# Patient Record
Sex: Male | Born: 1955 | Race: White | Hispanic: No | Smoking: Former smoker
Health system: Southern US, Community
[De-identification: ages and names within clinical notes are randomized; demographics above are authoritative.]

## PROBLEM LIST (undated history)

## (undated) DIAGNOSIS — K5792 Diverticulitis of intestine, part unspecified, without perforation or abscess without bleeding: Secondary | ICD-10-CM

## (undated) DIAGNOSIS — I350 Nonrheumatic aortic (valve) stenosis: Secondary | ICD-10-CM

## (undated) DIAGNOSIS — K222 Esophageal obstruction: Secondary | ICD-10-CM

## (undated) DIAGNOSIS — K922 Gastrointestinal hemorrhage, unspecified: Secondary | ICD-10-CM

## (undated) DIAGNOSIS — I10 Essential (primary) hypertension: Secondary | ICD-10-CM

## (undated) DIAGNOSIS — I429 Cardiomyopathy, unspecified: Secondary | ICD-10-CM

## (undated) DIAGNOSIS — I2510 Atherosclerotic heart disease of native coronary artery without angina pectoris: Secondary | ICD-10-CM

## (undated) DIAGNOSIS — E785 Hyperlipidemia, unspecified: Secondary | ICD-10-CM

## (undated) DIAGNOSIS — K649 Unspecified hemorrhoids: Secondary | ICD-10-CM

## (undated) DIAGNOSIS — E119 Type 2 diabetes mellitus without complications: Secondary | ICD-10-CM

## (undated) DIAGNOSIS — G4730 Sleep apnea, unspecified: Secondary | ICD-10-CM

## (undated) DIAGNOSIS — M109 Gout, unspecified: Secondary | ICD-10-CM

## (undated) DIAGNOSIS — D126 Benign neoplasm of colon, unspecified: Secondary | ICD-10-CM

## (undated) HISTORY — PX: CORONARY ARTERY BYPASS GRAFT: SHX141

## (undated) HISTORY — DX: Gastrointestinal hemorrhage, unspecified: K92.2

## (undated) HISTORY — PX: SEPTOPLASTY: SUR1290

## (undated) HISTORY — PX: CORONARY ANGIOPLASTY WITH STENT PLACEMENT: SHX49

## (undated) HISTORY — PX: OTHER SURGICAL HISTORY: SHX169

## (undated) HISTORY — PX: CARDIAC SURGERY: SHX584

---

## 2011-12-25 DIAGNOSIS — B029 Zoster without complications: Secondary | ICD-10-CM | POA: Insufficient documentation

## 2011-12-25 DIAGNOSIS — Z1211 Encounter for screening for malignant neoplasm of colon: Secondary | ICD-10-CM | POA: Insufficient documentation

## 2011-12-25 DIAGNOSIS — I10 Essential (primary) hypertension: Secondary | ICD-10-CM | POA: Insufficient documentation

## 2011-12-25 DIAGNOSIS — M109 Gout, unspecified: Secondary | ICD-10-CM | POA: Insufficient documentation

## 2012-04-17 ENCOUNTER — Observation Stay: Payer: Self-pay | Admitting: Internal Medicine

## 2012-04-17 LAB — COMPREHENSIVE METABOLIC PANEL
Albumin: 4.7 g/dL (ref 3.4–5.0)
Alkaline Phosphatase: 102 U/L (ref 50–136)
Anion Gap: 13 (ref 7–16)
BUN: 14 mg/dL (ref 7–18)
Calcium, Total: 9.1 mg/dL (ref 8.5–10.1)
Chloride: 100 mmol/L (ref 98–107)
Co2: 23 mmol/L (ref 21–32)
Creatinine: 0.86 mg/dL (ref 0.60–1.30)
EGFR (African American): >60
EGFR (Non-African Amer.): >60
Glucose: 105 mg/dL — ABNORMAL HIGH (ref 65–99)
Osmolality: 273 (ref 275–301)
Potassium: 3.7 mmol/L (ref 3.5–5.1)
SGOT(AST): 81 U/L — ABNORMAL HIGH (ref 15–37)
Sodium: 136 mmol/L (ref 136–145)

## 2012-04-17 LAB — CBC
HCT: 48.9 % (ref 40.0–52.0)
HGB: 17 g/dL (ref 13.0–18.0)
MCH: 31.4 pg (ref 26.0–34.0)
MCV: 90 fL (ref 80–100)
Platelet: 186 10*3/uL (ref 150–440)
RBC: 5.41 10*6/uL (ref 4.40–5.90)
RDW: 12.7 % (ref 11.5–14.5)
WBC: 6.5 10*3/uL (ref 3.8–10.6)

## 2012-04-18 LAB — CBC WITH DIFFERENTIAL/PLATELET
Basophil %: 0.8 %
Eosinophil #: 0.3 10*3/uL (ref 0.0–0.7)
HGB: 16.3 g/dL (ref 13.0–18.0)
MCH: 31.7 pg (ref 26.0–34.0)
MCHC: 35.1 g/dL (ref 32.0–36.0)
Monocyte #: 0.8 x10 3/mm (ref 0.2–1.0)
Neutrophil #: 4.5 10*3/uL (ref 1.4–6.5)
Neutrophil %: 57.3 %
Platelet: 191 10*3/uL (ref 150–440)

## 2012-04-18 LAB — COMPREHENSIVE METABOLIC PANEL
Alkaline Phosphatase: 79 U/L (ref 50–136)
BUN: 14 mg/dL (ref 7–18)
Bilirubin,Total: 0.7 mg/dL (ref 0.2–1.0)
Calcium, Total: 8.8 mg/dL (ref 8.5–10.1)
Co2: 28 mmol/L (ref 21–32)
Creatinine: 1.06 mg/dL (ref 0.60–1.30)
Osmolality: 276 (ref 275–301)
Potassium: 3.9 mmol/L (ref 3.5–5.1)
SGOT(AST): 67 U/L — ABNORMAL HIGH (ref 15–37)
SGPT (ALT): 121 U/L — ABNORMAL HIGH (ref 12–78)
Total Protein: 7.8 g/dL (ref 6.4–8.2)

## 2012-04-18 LAB — LIPID PANEL
Cholesterol: 181 mg/dL (ref 0–200)
Triglycerides: 230 mg/dL — ABNORMAL HIGH (ref 0–200)
VLDL Cholesterol, Calc: 46 mg/dL — ABNORMAL HIGH (ref 5–40)

## 2012-04-18 LAB — CK TOTAL AND CKMB (NOT AT ARMC): CK-MB: 2.4 ng/mL (ref 0.5–3.6)

## 2012-04-21 ENCOUNTER — Inpatient Hospital Stay: Payer: Self-pay | Admitting: Internal Medicine

## 2012-04-21 DIAGNOSIS — J449 Chronic obstructive pulmonary disease, unspecified: Secondary | ICD-10-CM | POA: Insufficient documentation

## 2012-04-21 DIAGNOSIS — G4730 Sleep apnea, unspecified: Secondary | ICD-10-CM | POA: Insufficient documentation

## 2012-04-21 LAB — COMPREHENSIVE METABOLIC PANEL
Albumin: 4 g/dL (ref 3.4–5.0)
Alkaline Phosphatase: 115 U/L (ref 50–136)
BUN: 23 mg/dL — ABNORMAL HIGH (ref 7–18)
Bilirubin,Total: 0.5 mg/dL (ref 0.2–1.0)
Calcium, Total: 9.4 mg/dL (ref 8.5–10.1)
Chloride: 105 mmol/L (ref 98–107)
Co2: 24 mmol/L (ref 21–32)
EGFR (Non-African Amer.): >60
Glucose: 138 mg/dL — ABNORMAL HIGH (ref 65–99)
Osmolality: 280 (ref 275–301)
Potassium: 3.8 mmol/L (ref 3.5–5.1)
SGOT(AST): 73 U/L — ABNORMAL HIGH (ref 15–37)
Sodium: 137 mmol/L (ref 136–145)

## 2012-04-21 LAB — CBC WITH DIFFERENTIAL/PLATELET
Basophil #: 0 10*3/uL (ref 0.0–0.1)
Basophil %: 0.4 %
Eosinophil #: 0.2 10*3/uL (ref 0.0–0.7)
Eosinophil %: 2 %
HGB: 15.3 g/dL (ref 13.0–18.0)
Lymphocyte %: 20.7 %
MCH: 31.5 pg (ref 26.0–34.0)
MCHC: 34.8 g/dL (ref 32.0–36.0)
MCV: 91 fL (ref 80–100)
Neutrophil #: 6.3 10*3/uL (ref 1.4–6.5)
Neutrophil %: 70.9 %
Platelet: 204 10*3/uL (ref 150–440)
RBC: 4.86 10*6/uL (ref 4.40–5.90)
RDW: 12.6 % (ref 11.5–14.5)

## 2012-04-21 LAB — CBC
HCT: 46.2 % (ref 40.0–52.0)
MCHC: 35.4 g/dL (ref 32.0–36.0)
MCV: 90 fL (ref 80–100)
RBC: 5.13 10*6/uL (ref 4.40–5.90)
RDW: 12.7 % (ref 11.5–14.5)

## 2012-04-21 LAB — DRUG SCREEN, URINE
Amphetamines, Ur Screen: NEGATIVE (ref ?–1000)
Barbiturates, Ur Screen: NEGATIVE (ref ?–200)
Benzodiazepine, Ur Scrn: NEGATIVE (ref ?–200)
Cocaine Metabolite,Ur ~~LOC~~: NEGATIVE (ref ?–300)
MDMA (Ecstasy)Ur Screen: NEGATIVE (ref ?–500)
Opiate, Ur Screen: NEGATIVE (ref ?–300)
Tricyclic, Ur Screen: NEGATIVE (ref ?–1000)

## 2012-04-21 LAB — PROTIME-INR
INR: 0.9
Prothrombin Time: 12.7 secs (ref 11.5–14.7)

## 2012-04-21 LAB — CK TOTAL AND CKMB (NOT AT ARMC)
CK, Total: 121 U/L (ref 35–232)
CK-MB: 2.2 ng/mL (ref 0.5–3.6)

## 2012-04-21 LAB — MAGNESIUM: Magnesium: 1.7 mg/dL — ABNORMAL LOW

## 2012-04-22 DIAGNOSIS — G4733 Obstructive sleep apnea (adult) (pediatric): Secondary | ICD-10-CM | POA: Insufficient documentation

## 2012-04-22 DIAGNOSIS — I2510 Atherosclerotic heart disease of native coronary artery without angina pectoris: Secondary | ICD-10-CM | POA: Insufficient documentation

## 2012-04-22 DIAGNOSIS — I10 Essential (primary) hypertension: Secondary | ICD-10-CM | POA: Insufficient documentation

## 2012-04-25 DIAGNOSIS — S37009A Unspecified injury of unspecified kidney, initial encounter: Secondary | ICD-10-CM | POA: Insufficient documentation

## 2012-04-28 DIAGNOSIS — D62 Acute posthemorrhagic anemia: Secondary | ICD-10-CM | POA: Insufficient documentation

## 2012-04-28 DIAGNOSIS — F10239 Alcohol dependence with withdrawal, unspecified: Secondary | ICD-10-CM | POA: Insufficient documentation

## 2012-04-28 DIAGNOSIS — F10939 Alcohol use, unspecified with withdrawal, unspecified: Secondary | ICD-10-CM | POA: Insufficient documentation

## 2012-05-01 DIAGNOSIS — Z955 Presence of coronary angioplasty implant and graft: Secondary | ICD-10-CM | POA: Insufficient documentation

## 2012-05-12 DIAGNOSIS — Z951 Presence of aortocoronary bypass graft: Secondary | ICD-10-CM | POA: Insufficient documentation

## 2012-06-30 DIAGNOSIS — R972 Elevated prostate specific antigen [PSA]: Secondary | ICD-10-CM | POA: Insufficient documentation

## 2012-09-25 DIAGNOSIS — Z9861 Coronary angioplasty status: Secondary | ICD-10-CM | POA: Insufficient documentation

## 2012-12-15 ENCOUNTER — Encounter: Payer: Self-pay | Admitting: Cardiology

## 2013-01-01 ENCOUNTER — Encounter: Payer: Self-pay | Admitting: Cardiology

## 2013-02-01 ENCOUNTER — Encounter: Payer: Self-pay | Admitting: Cardiology

## 2013-05-10 ENCOUNTER — Emergency Department: Payer: Self-pay | Admitting: Emergency Medicine

## 2013-05-10 LAB — CBC
HCT: 42.7 % (ref 40.0–52.0)
HGB: 15.3 g/dL (ref 13.0–18.0)
MCV: 88 fL (ref 80–100)
Platelet: 175 10*3/uL (ref 150–440)
RDW: 12.8 % (ref 11.5–14.5)
WBC: 8 10*3/uL (ref 3.8–10.6)

## 2013-05-10 LAB — COMPREHENSIVE METABOLIC PANEL
Albumin: 3.9 g/dL (ref 3.4–5.0)
Alkaline Phosphatase: 106 U/L (ref 50–136)
Anion Gap: 6 — ABNORMAL LOW (ref 7–16)
Bilirubin,Total: 0.5 mg/dL (ref 0.2–1.0)
Chloride: 108 mmol/L — ABNORMAL HIGH (ref 98–107)
EGFR (African American): >60
EGFR (Non-African Amer.): >60
Osmolality: 279 (ref 275–301)
SGOT(AST): 31 U/L (ref 15–37)
SGPT (ALT): 58 U/L (ref 12–78)
Sodium: 139 mmol/L (ref 136–145)

## 2013-05-10 LAB — TROPONIN I: Troponin-I: <0.02 ng/mL

## 2013-05-11 ENCOUNTER — Emergency Department: Payer: Self-pay | Admitting: Emergency Medicine

## 2013-05-12 LAB — CBC
MCH: 31.6 pg (ref 26.0–34.0)
MCHC: 36.4 g/dL — ABNORMAL HIGH (ref 32.0–36.0)
Platelet: 176 10*3/uL (ref 150–440)
RBC: 4.62 10*6/uL (ref 4.40–5.90)
WBC: 7.5 10*3/uL (ref 3.8–10.6)

## 2013-05-12 LAB — COMPREHENSIVE METABOLIC PANEL
Albumin: 4 g/dL (ref 3.4–5.0)
Alkaline Phosphatase: 105 U/L (ref 50–136)
Bilirubin,Total: 0.4 mg/dL (ref 0.2–1.0)
Chloride: 105 mmol/L (ref 98–107)
EGFR (African American): >60
Osmolality: 280 (ref 275–301)
Potassium: 3.6 mmol/L (ref 3.5–5.1)
SGPT (ALT): 57 U/L (ref 12–78)
Sodium: 139 mmol/L (ref 136–145)

## 2013-05-12 LAB — TROPONIN I: Troponin-I: <0.02 ng/mL

## 2013-05-16 ENCOUNTER — Observation Stay: Payer: Self-pay | Admitting: Internal Medicine

## 2013-05-16 LAB — CBC
HCT: 47.3 % (ref 40.0–52.0)
MCH: 30.7 pg (ref 26.0–34.0)
MCV: 88 fL (ref 80–100)
Platelet: 218 10*3/uL (ref 150–440)
RBC: 5.36 10*6/uL (ref 4.40–5.90)

## 2013-05-16 LAB — COMPREHENSIVE METABOLIC PANEL
Albumin: 4.5 g/dL (ref 3.4–5.0)
Anion Gap: 8 (ref 7–16)
BUN: 19 mg/dL — ABNORMAL HIGH (ref 7–18)
Bilirubin,Total: 0.8 mg/dL (ref 0.2–1.0)
Calcium, Total: 10.2 mg/dL — ABNORMAL HIGH (ref 8.5–10.1)
Chloride: 100 mmol/L (ref 98–107)
Co2: 26 mmol/L (ref 21–32)
Glucose: 129 mg/dL — ABNORMAL HIGH (ref 65–99)
Potassium: 4.1 mmol/L (ref 3.5–5.1)
SGPT (ALT): 55 U/L (ref 12–78)
Sodium: 134 mmol/L — ABNORMAL LOW (ref 136–145)

## 2013-05-16 LAB — URINALYSIS, COMPLETE
Blood: NEGATIVE
Glucose,UR: NEGATIVE mg/dL (ref 0–75)
Ketone: NEGATIVE
Leukocyte Esterase: NEGATIVE
Ph: 6 (ref 4.5–8.0)
WBC UR: <1 /HPF (ref 0–5)

## 2013-05-16 LAB — TROPONIN I
Troponin-I: <0.02 ng/mL
Troponin-I: <0.02 ng/mL

## 2013-05-16 LAB — LIPASE, BLOOD: Lipase: 124 U/L (ref 73–393)

## 2013-05-16 LAB — CK TOTAL AND CKMB (NOT AT ARMC)
CK, Total: 82 U/L (ref 35–232)
CK, Total: 91 U/L (ref 35–232)
CK-MB: 1.4 ng/mL (ref 0.5–3.6)

## 2014-03-16 DIAGNOSIS — E785 Hyperlipidemia, unspecified: Secondary | ICD-10-CM | POA: Insufficient documentation

## 2014-03-16 DIAGNOSIS — E782 Mixed hyperlipidemia: Secondary | ICD-10-CM | POA: Insufficient documentation

## 2014-04-13 ENCOUNTER — Emergency Department: Payer: Self-pay | Admitting: Emergency Medicine

## 2014-08-01 ENCOUNTER — Emergency Department: Payer: Self-pay | Admitting: Emergency Medicine

## 2014-08-01 LAB — BASIC METABOLIC PANEL
Anion Gap: 9 (ref 7–16)
BUN: 18 mg/dL (ref 7–18)
Calcium, Total: 8.5 mg/dL (ref 8.5–10.1)
Chloride: 101 mmol/L (ref 98–107)
Co2: 25 mmol/L (ref 21–32)
Creatinine: 1.51 mg/dL — ABNORMAL HIGH (ref 0.60–1.30)
EGFR (African American): >60
EGFR (Non-African Amer.): 50 — ABNORMAL LOW
Glucose: 237 mg/dL — ABNORMAL HIGH (ref 65–99)
Osmolality: 280 (ref 275–301)
Potassium: 3.9 mmol/L (ref 3.5–5.1)
Sodium: 135 mmol/L — ABNORMAL LOW (ref 136–145)

## 2014-08-01 LAB — CBC
HCT: 44.7 % (ref 40.0–52.0)
HGB: 15.4 g/dL (ref 13.0–18.0)
MCH: 31.3 pg (ref 26.0–34.0)
MCHC: 34.6 g/dL (ref 32.0–36.0)
MCV: 91 fL (ref 80–100)
Platelet: 169 10*3/uL (ref 150–440)
RBC: 4.93 10*6/uL (ref 4.40–5.90)
RDW: 13.6 % (ref 11.5–14.5)
WBC: 6.4 10*3/uL (ref 3.8–10.6)

## 2014-08-01 LAB — TROPONIN I: Troponin-I: <0.02 ng/mL

## 2014-08-01 LAB — PROTIME-INR
INR: 1
Prothrombin Time: 12.9 secs (ref 11.5–14.7)

## 2014-08-01 LAB — PRO B NATRIURETIC PEPTIDE: B-Type Natriuretic Peptide: 255 pg/mL — ABNORMAL HIGH (ref 0–125)

## 2014-08-02 LAB — TROPONIN I: Troponin-I: <0.02 ng/mL

## 2014-09-13 DIAGNOSIS — I872 Venous insufficiency (chronic) (peripheral): Secondary | ICD-10-CM | POA: Insufficient documentation

## 2014-12-21 NOTE — Discharge Summary (Signed)
PATIENT NAME:  [name], [name] MR#:  [id number] DATE OF BIRTH:  08/18/1956  DATE OF ADMISSION:  04/21/2012 DATE OF DISCHARGE:  04/21/2012  ADMITTING PHYSICIAN: Gladstone Lighter, MD  TRANSFERRING PHYSICIAN: Gladstone Lighter, MD (The patient is being transferred to [facility] facility)  PRIMARY CARE PHYSICIAN: [facility]   CONSULTANTS: Bartholome Bill, MD  DISCHARGE DIAGNOSES:  1. Non-ST-segment elevation myocardial infarction status post cardiac catheterization revealing three-vessel disease requiring BiPAP.  2. Malignant hypertension.  3. Elevated transaminases.  4. Alcohol abuse.  5. Tobacco use disorder.   DISCHARGE MEDICATIONS: 1. Heparin drip.  2. Multivitamin 1 tablet p.o. daily.  3. Aspirin 81 mg p.o. daily.  4. Atorvastatin 10 mg p.o. daily.  5. HCTZ/losartan 12.5/50 mg 1 tablet p.o. twice a day. 6. Metoprolol 25 mg p.o. twice a day. 7. Imdur 30 mg p.o. daily.   DISCHARGE DIET: Low-sodium diet.  DISCHARGE OXYGEN: Saturations are fine, but will continue oxygen during transfer, 2 liters.   DISCHARGE ACTIVITY: As tolerated.    FOLLOWUP INSTRUCTIONS: The patient is being transferred to [facility] for bypass surgery.   LABS AND IMAGING STUDIES: WBC 8.8, hemoglobin 15.3, hematocrit 44.0, and platelet count 204.   Sodium 137, potassium 3.8, chloride 105, bicarbonate 24, BUN 23, creatinine 1.2, glucose 138, and calcium 9.4.   ALT 121, AST 73, alkaline phosphatase 115, total bilirubin 0.5, and albumin 4.0.   INR 0.9. Troponin 0.07.   Ultrasound of the abdomen done for elevated LFTs is showing echogenic bile versus sludge within the otherwise normal-appearing gallbladder.  Hepatitis A, B, and C panel is negative.   Urine tox screen is negative.   Cardiac catheterization is showing three-vessel disease with 60% left main lesion, 75% proximal LAD, 75% mid LAD, 70% first diagonal lesion, and 75% in a second lesion in the first diagonal. Obtuse marginal has 90% stenosis and 80%  stenosis in a second lesions, in the middle of the segment. First left posterolateral has 80% stenosis in 75% stenosis in the RCA.  BRIEF HOSPITAL COURSE: [name] is a 59 year old male with only past medical history significant for hypertension and smoking who had a recent hospitalization for chest pain and elevated blood pressure on 04/17/2012 and also elevated troponin at the time and was discharged when the stress test was negative, on 04/18/2012. The patient went home and had not started taking all his new blood pressure medications, but woke up with chest pain again this early this  morning and came back to the ER. When he had chest pain, there were some ST depressions in the lateral leads and after the blood pressure was controlled his depression was reversed showing normal sinus rhythm. Because of his recurrent presentation and risk factors, he was taken for cardiac catheterization and it showed three vessel disease, as mentioned above. So the patient is being transferred to [facility] for consideration of coronary artery bypass graft surgery at this time.   DISCHARGE CONDITION: Stable.     DISCHARGE DISPOSITION: To [facility] [place].   TIME SPENT ON DISCHARGE: 45 minutes. ____________________________ Gladstone Lighter, MD rk:slb D: 04/21/2012 15:13:18 ET T: 04/21/2012 16:04:01 ET JOB#: [id number]  cc: Gladstone Lighter, MD, <Dictator> [facility] Gladstone Lighter MD ELECTRONICALLY SIGNED 04/23/2012 12:44

## 2014-12-21 NOTE — H&P (Signed)
PATIENT NAME:  [name] [name], [name] [name] MR#:  [id number] DATE OF BIRTH:  1955/12/22  DATE OF ADMISSION:  04/21/2012  ADMITTING PHYSICIAN: Dr. Gladstone Lighter.   PRIMARY CARE PHYSICIAN: [facility].   CHIEF COMPLAINT: Chest pain.   HISTORY OF PRESENT ILLNESS: [name] [name] is a 59 year old Caucasian male with a past medical history significant for hypertension, obstructive sleep apnea, and history of alcohol use and smoking who was recently admitted to the hospital four days ago with similar complaints of chest pain and elevated blood pressure who comes back with the same complaints. The patient was here, as mentioned, about three or four days ago for chest pain, found to have elevated blood pressure of 254/138, was admitted, had a stress test done because troponins were positive. His stress test was found to be negative and he was chest pain free and was discharged home. His medications were adjusted. He has not started taking all of his new blood pressure medications yet. He found his pressure was fine last night before he went to bed. He woke up around 3:00 a.m. this morning with intense chest pressure. It was a discomfort chest pressure feeling across the upper chest and radiates to the jaw and also to the right arm. He denies any epigastric pain or reflux-like problems. He just feels uncomfortable and short of breath though he knows that he is breathing fine during this episode. When he rechecked his blood pressure, it was systolic greater than 244 at the time so he came to the Emergency Room. Once his blood pressure improved, his chest pain improved too. His troponin is still elevated at 0.07, but it is better than last discharge. He denies any pleuritis component of the chest pain.   PAST MEDICAL HISTORY:  1. Uncontrolled hypertension.  2. Obstructive sleep apnea.  3. Tobacco use disorder, which he recently quit. 4. Alcohol use.   PAST SURGICAL HISTORY: Septoplasty when he was young.   ALLERGIES: No  known drug allergies.   CURRENT MEDICATIONS:  1. Aspirin 81 mg p.o. daily.  2. Atorvastatin 10 mg p.o. daily.  3. Losartan/HCTZ 50/12.5 mg 1 tablet p.o. b.i.d.  4. Imdur 30 mg p.o. daily.  5. Metoprolol 25 mg p.o. b.i.d.  6. Multivitamin 1 tablet p.o. daily.   SOCIAL HISTORY: He lives at home with another roommate. He used to smoke about 1 pack per day, recently cut down and has not smoked any in the last couple of weeks. He is doing some part-time work and drinks alcohol about 2 to 3 shots of rum every day.   FAMILY HISTORY: Significant for hypertension and diabetes. Father died of old age and mom had weak heart and died in her 41s.   REVIEW OF SYSTEMS: CONSTITUTIONAL: No fever, fatigue, or weakness. EYES: No blurred vision, double vision, pain, inflammation, or glaucoma. ENT: No tinnitus, ear pain, hearing loss, epistaxis or discharge. RESPIRATORY: No cough, wheeze, hemoptysis, or chronic obstructive pulmonary disease. CARDIOVASCULAR: Positive for chest pain. No orthopnea, edema, arrhythmia, palpitations, or syncope. GASTROINTESTINAL: No nausea, vomiting, diarrhea, abdominal pain, hematemesis or melena. GENITOURINARY: No dysuria, hematuria, renal calculus, frequency, or incontinence. ENDOCRINE: No polyuria, nocturia, thyroid problems, heat or cold intolerance. HEMATOLOGY: No anemia, easy bruising or bleeding. SKIN: No acne, rash, or lesions. MUSCULOSKELETAL: No neck, back, shoulder pain, arthritis, or gout. NEUROLOGIC: No numbness, weakness, cerebrovascular accident, transient ischemic attack, or seizures. PSYCHOLOGICAL: No anxiety, insomnia, or depression.   PHYSICAL EXAMINATION:  VITAL SIGNS: Temperature 98.4 degrees Fahrenheit, pulse 85, respirations 16, blood  pressure 213/114, pulse oximetry 98% on room air.   GENERAL: Well built, well nourished male lying in bed, not in any acute distress.   HEENT: Normocephalic, atraumatic. Pupils equal, round, reacting to light. Anicteric sclerae.  Extraocular movements intact. Oropharynx clear without erythema, mass or exudates.   NECK: Supple. No thyromegaly, jugular venous distention or carotid bruits. No lymphadenopathy.    LUNGS: Clear to auscultation bilaterally. No wheeze or crackles. No use of accessory muscles for breathing.   CARDIOVASCULAR: S1, S2 regular rate and rhythm. No murmurs, rubs, or gallops. No pedal edema.   EXTREMITIES: No pedal edema. No clubbing or cyanosis. 2+ dorsalis pedis pulses palpable bilaterally.   ABDOMEN: Soft, nontender, nondistended. No hepatosplenomegaly. Normal bowel sounds.   SKIN: No acne, rash, or lesions.   NEUROLOGIC: Cranial nerves intact. No focal motor or sensory deficits.   PSYCHOLOGICAL: The patient has normal mood, alert, oriented to time, place and person.   LABORATORY, DIAGNOSTIC AND RADIOLOGICAL DATA: WBC 8.4, hemoglobin 16.4, hematocrit 46.2, platelet count 188. Sodium 137, potassium 3.8, chloride 105, bicarbonate 24, BUN 23, creatinine 1.2, glucose 138, calcium 9.4. CK 121, CK-MB 2.2. Troponin 0.07. LFTs showing albumin 4.0, alkaline phosphatase 115, AST 73, ALT 159, magnesium 1.7. INR 0.9. Hepatitis panel was negative done last admission three days ago. Initial EKG showed some ST depressions in the lateral leads V4 to V6. Repeat EKG after the patient was chest pain free shows normal sinus rhythm, heart rate of 80.   ASSESSMENT AND PLAN: This is a 59 year old male with history of hypertension, history of smoking with recent admission for chest pain and stress test was negative who comes back with similar presentation.  1. Chest pain, atypical in nature. Could be angina triggered by hypertension. Recent Myoview is negative. Will admit to telemetry. Recycle cardiac enzymes. Get a CT of the chest to rule out PE and underlying pathology. Will get cardiology consult.  2. Elevated troponins, likely from demand ischemia and also from hypertension. Continue to recycle. Recent Myoview is  negative.  3. Malignant hypertension/accelerated hypertension. Restarted on new medications three days ago, has not started taking them yet. Continue Imdur, losartan/HCTZ and metoprolol for now.  4. Elevated transaminases, probably from alcohol use. Hepatitis panel is negative. Continue to monitor as an outpatient and might need liver biopsy if does not improve.  5. Tobacco use disorder, recently stopped about two weeks ago. No need for any nicotine patch while here in the hospital.  6. Alcohol use. We will check alcohol level and also urine drug screen. No history of withdrawal so will continue to monitor for now.  7. Gastrointestinal and deep vein thrombosis prophylaxis. On Protonix and subcutaneous heparin.  8. Code status of the patient is FULL CODE.   TIME SPENT: 50 minutes.   ____________________________ Gladstone Lighter, MD rk:ap D: 04/21/2012 08:00:50 ET T: 04/21/2012 09:10:26 ET JOB#: [id number]  cc: Gladstone Lighter, MD, <Dictator> [facility] Gladstone Lighter MD ELECTRONICALLY SIGNED 04/23/2012 12:43

## 2014-12-21 NOTE — Discharge Summary (Signed)
PATIENT NAME:  [name] [name], [name] [name] MR#:  [id number] DATE OF BIRTH:  1955/09/22  DATE OF ADMISSION:  04/17/2012 DATE OF DISCHARGE:  04/18/2012  DISCHARGE DIAGNOSES:  1. Shortness of breath secondary to malignant hypertension.  2. Non-ST-elevation myocardial infarction was secondary to malignant hypertension. 3. 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  4. Hyperlipidemia.  5. Tobacco abuse. 6. Alcohol abuse.   DISCHARGE MEDICATIONS:  1. Multivitamin 1 tablet daily.  2. Aspirin 81 mg daily.  3. Hydrochlorothiazide/losartan 12.5/50, 1 tablet p.o. b.i.d.  4. Atorvastatin 10 mg p.o. daily.  5. Metoprolol 25 mg p.o. b.i.d.  6. Imdur 30 mg daily.   DIET: Low sodium, low fat diet.   CONSULTATIONS: None.   LABORATORY, DIAGNOSTIC, AND RADIOLOGICAL DATA: TSH 2.49, hemoglobin A1c 6.1. Hepatitis A, B, C serologies negative. BNP 75.   Electrolytes on admission: Sodium 136, potassium 3.7, chloride 100, bicarbonate 23, BUN 14, creatinine 0.86, glucose 105, liver functions within normal limits. WBC 6.5, hemoglobin 17, hematocrit 48.9, platelets 186.   EKG showed normal sinus with no ST-T changes.   First troponin 0.23, next troponin 0.13. LDL 107, HDL 28, triglycerides 230.  The patient had a stress test, waiting for results.   HOSPITAL COURSE: The patient is a 59 year old male patient with history of hypertension who came in because he was not feeling well, had some jaw pain and some discomfort in the mid sternal area.  The patient was found to have elevated blood pressure, 254/138.  He was admitted to the hospitalist service for malignant hypertension and unstable angina. The patient was taking  HCTZ and losartan and we increased it to twice daily and added metoprolol along with nitroglycerin topically. Troponin was slightly up. The patient had a stress test this morning. Results are pending. Blood pressure has nicely improved and this morning it is 153/66. Heart rate is around  91.The patient denies any more chest pain or headache. He will be on an increased dose of losartan for his blood pressure and also metoprolol also is added, 25 mg twice daily.  For his hyperlipidemia we have added atorvastatin 10 mg daily.  If the stress test is negative the patient can be discharged home and follow up with his primary doctor in [place]. The patient was found to have elevated LFTs with AST and ALT 61 and 121 respectively with normal alkaline phosphatase. He has a history of alcohol use, drinks about three half shots of rum every day and also smokes 2 cigars a day.  His elevated liver function tests are thought to be secondary to alcohol liver disease.   TIME SPENT ON DISCHARGE PREPARATION: More than 30 minutes. If stress test is negative he will be going home.  addendum:stress test result showed no wall motion abnormalities,EF around 48%  ____________________________ Epifanio Lesches, MD sk:bjt D: 04/18/2012 13:07:38 ET T: 04/19/2012 12:17:16 ET JOB#: [id number]  cc: Epifanio Lesches, MD, <Dictator> Epifanio Lesches MD ELECTRONICALLY SIGNED 05/02/2012 14:22

## 2014-12-21 NOTE — H&P (Signed)
PATIENT NAME:  [name] [name], [name] [name] MR#:  [id number] DATE OF BIRTH:  [birth year]  DATE OF ADMISSION:  04/17/2012  PRIMARY CARE PHYSICIAN: [facility]  The patient is a 59 year old Caucasian male with past medical history significant for history of hypertension who presented to the hospital with complaints of discomfort. According to the patient he was sitting in his bed today when he suddenly felt that he was short of breath. Whenever he used CPAP his shortness of breath somewhat improved. Also, he noticed jaw pain and some discomfort in the midsternal area. He admitted also to have right arm pains. Upon further questioning, it is difficult to understand how long he was having that discomfort, but apparently this happened today. Now this pain is gone.  On arrival to the Emergency Room, the patient was noted to be severely hypertensive with systolic blood pressure of 045 and diastolic 409. Patient was given nitroglycerin topically as well as aspirin in the Emergency Room, and hospitalist services were contacted for admission to rule out myocardial infarction.  PAST MEDICAL HISTORY:  Significant for history of hypertension.  MEDICATIONS: Aspirin 81 mg p.o. daily, hydrochlorothiazide/losartan 12.5/50 once daily, multivitamins once daily.  SURGERIES: Septoplasty in teenage years.   ALLERGIES: None.   FAMILY HISTORY: Hypertension in family members, some diabetes. The patient is not sure if patient's dad had diabetes or not, but he died at the age of 73. The patient's aunt had diabetes. The patient's mother had a weak heart and died at the age of 29.   SOCIAL HISTORY: The patient is single. Has no children. Used to smoke since age of 51, prior heavy smoking, however, now he is trying to cut down. He is down to maybe 1 to 2 cigarettes a day. 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 The patient drinks approximately 3-1/2 shots a day of  rum.  REVIEW OF SYSTEMS: Positive for pains in the right arm as well as chest, as well as jaw. Some shortness of breath. CONSTITUTIONAL: Otherwise denies any fevers, chills, fatigue, weakness, weight loss or gain. EYES: Denies blurry vision, double vision, glaucoma, or cataracts. ENT: Denies any tinnitus, allergies, epistaxis, sinus pain, dentures, or difficulty swallowing. RESPIRATORY: Denies any cough, wheezing, asthma. Chronic obstructive pulmonary disease. CARDIOVASCULAR: Denies orthopnea, edema, arrhythmias, or syncope. GASTROINTESTINAL: Denies any nausea, vomiting, diarrhea, or constipation. GENITOURINARY: Denies dysuria, hematuria, frequency, or incontinence. ENDOCRINOLOGY: Denies any polydipsia, nocturia, thyroid problems, heat or cold intolerance, or thirst. HEMATOLOGIC: Denies anemia, easy bruising, bleeding, or swollen gums. SKIN: Denies acne, rashes, lesions, change in moles. MUSCULOSKELETAL: Denies arthralgias, cramps, swollen joints. NEUROLOGIC: No numbness, epilepsy, or tremor. PSYCHIATRIC: Denies anxiety, insomnia, or depression.  PHYSICAL EXAMINATION:  VITAL SIGNS: On admission to the hospital temperature 97.8, pulse 86, respiratory rate was 16, blood pressure 240/116, oxygen saturation was 98% on room air.   GENERAL: He is a well-nourished Caucasian male in no significant distress, comfortable on the stretcher.  HEENT: His pupils are equal and reactive to light. Extraocular muscles are intact. No icterus or conjunctivitis. He has normal hearing. No pharyngeal erythema. Mucosa is moist.   NECK: There are no masses. Neck is supple, nontender. Thyroid is not enlarged. No adenopathy. No JVD or carotid bruits. There is full range of motion.   LUNGS: Clear to auscultation in all fields. Few rhonchi were heard at bases. Otherwise no rales or wheezing. No chest tenderness to palpation.   EXTREMITIES: Normal pedal pulses. No lower  extremity edema, calf tenderness, or cyanosis was noted.    CARDIOVASCULAR: S1, S2 appreciated. No murmurs, gallops, rubs noted. PMI is not lateralized. Somewhat  distant sounds. Chest is nontender to palpation, 1+ pedal pulses. No lower extremity edema, calf tenderness, or cyanosis.   ABDOMEN: Soft, nontender. Bowel sounds are present. No hepatosplenomegaly or masses were noted.   MUSCULOSKELETAL: Able to move all extremities. No cyanosis, degenerative joint disease,  or kyphosis. Gait was not tested.  SKIN: Did not reveal any rashes, lesions, erythema, nodularity, induration. It was warm and dry to palpation. No adenopathy in the cervical region.   NEUROLOGIC: Cranial nerves grossly intact. Sensory is intact. No dysarthria or aphasia. The patient is alert and oriented to time, person, and place. Cooperative. Memory is good.  PSYCHIATRIC: No significant confusion, agitation, or depression. The patient is fidgety and somewhat difficult to get information from.  LABORATORY, DIAGNOSTIC, AND RADIOLOGICAL DATA: BMP showed elevated glucose to 105, otherwise unremarkable study. The patient's total protein elevated at 8.9, alkaline phosphatase is normal; however, the patient's AST and ALT were elevated at 81 and 154 respectively. Troponin level less than 0.02. CBC: Within normal limits with white blood cell count 6.5, hemoglobin 17.0, platelet count 186,000. EKG: Normal sinus rhythm at 82 beats per minute, normal axis, minimal voltage criteria for left ventricular hypertrophy, may be normal variant. Borderline EKG according to EKG criteria. Chest x-ray, portable single view, April 17, 2012: No evidence of acute cardiopulmonary abnormality according to radiologist.   ASSESSMENT AND PLAN: 1. Malignant hypertension. Admit patient to medical floor. Advance his hydrochlorothiazide/losartan dose to twice daily. Add metoprolol, also nitroglycerin topically. Will follow blood pressure readings with attempt to decrease his blood pressure at around 140s at least.   2. Unstable angina with right arm pain, as well as jaw pain, as well as discomfort in his chest. Admit to rule out myocardial infarction. Will continue the patient on beta blockers, nitroglycerin, aspirin as well as heparin subcutaneous. I will get his cardiac enzymes x3 and get Myoview in the morning. 3. Dyspnea. Will follow patient's oxygenation as well as his comfort level with blood pressure management possibly just related to elevated blood pressure.  4. Elevated transaminases of unclear etiology. Will get hepatitis panel as well as ultrasound of right upper quadrant, questionably related to alcohol use. 5.  Tobacco abuse, this was discussed with patient for three minutes. He refuses nicotine replacement therapy as he tells me that he is trying to wean himself down anyway.   TIME: Time spent, 30 minutes.   ____________________________ Theodoro Grist, MD rv:kma D: 04/17/2012 13:57:18 ET T: 04/17/2012 15:24:13 ET JOB#: [id number]  cc: Theodoro Grist, MD, <Dictator> [place] MD ELECTRONICALLY SIGNED 05/03/2012 15:21

## 2014-12-21 NOTE — Consult Note (Signed)
General Aspect 59 yo male with history of hypertension and etoh abuse who was admitted several days ago with chest pain. He had a small troponin elevation and underwent a functional study which was unremarkable for signficant ischemia. He was discharged but now has returned with similar discomfort. Troponin is unremarkable thus far but ekg revaled st depression in the inferior lateral leads with pain and improved after resolution. He was hypertensive at the time. Risk factors include htn, tobacco abuse. He denies any prior cardiac disease. He is currently hemodynamically stable and pain free.   Physical Exam:   GEN well developed, well nourished, no acute distress    HEENT PERRL    NECK supple    RESP normal resp effort  clear BS  no use of accessory muscles    CARD Regular rate and rhythm  Normal, S1, S2  No murmur    ABD denies tenderness  normal BS    LYMPH negative neck, negative axillae    EXTR negative cyanosis/clubbing, negative edema    SKIN normal to palpation    NEURO cranial nerves intact, motor/sensory function intact    PSYCH A+O to time, place, person   Review of Systems:   Subjective/Chief Complaint chest pain with radiation to jaw and arms.    General: No Complaints    Skin: No Complaints    ENT: No Complaints    Eyes: No Complaints    Neck: Pain  pain rdiating from chest    Respiratory: No Complaints    Cardiovascular: Chest pain or discomfort  Tightness    Gastrointestinal: No Complaints    Genitourinary: No Complaints    Vascular: No Complaints    Musculoskeletal: No Complaints    Neurologic: No Complaints    Hematologic: No Complaints    Endocrine: No Complaints    Psychiatric: No Complaints    Review of Systems: All other systems were reviewed and found to be negative    Medications/Allergies Reviewed Medications/Allergies reviewed     HTN:    Hypercholesterolemia:    Nose Surgery:        Admit Diagnosis:   [facility] CHEST  PAIN: 21-Apr-2012, Active, [facility] CHEST PAIN      Admit Reason:   Chest pain: (786.50) Active, ICD9, Unspecified chest pain  Home Medications: Medication Instructions Status  isosorbide mononitrate 30 mg oral tablet, extended release 1 tab(s) orally once a day (in the morning) Active  metoprolol tartrate 25 mg oral tablet 1 tab(s) orally 2 times a day Active  hydrochlorothiazide-losartan 12.5 mg-50 mg oral tablet 1 tab(s) orally 2 times a day Active  atorvastatin 10 mg oral tablet 1 tab(s) orally once a day (at bedtime) Active  aspirin 81 mg oral delayed release tablet 1 tab(s) orally once a day Active  multivitamin 1 tab(s) orally once a day Active   EKG:   EKG NSR    Interpretation st depression in inferior  and lateral leads with pain   Radiology Results: XRay:    19-Aug-13 06:02, Chest Portable Single View   Chest Portable Single View    REASON FOR EXAM:    cp  COMMENTS:       PROCEDURE: DXR - DXR PORTABLE CHEST SINGLE VIEW  - Apr 21 2012  6:02AM     RESULT: Comparison is made to the study of April 17, 2012.    The lungs are adequately inflated. The cardiac silhouette is normal in   size. The mediastinum is normal in width. There is  no pleural effusion or   pneumothorax. The bony thorax exhibits no acute abnormality.    IMPRESSION:  There is no evidence of acute cardiopulmonary abnormality.   If the patient's symptoms persist and remain unexplained, followup PA and   lateral chest films or chest CT scanning would be useful.     Dictation Site: 2          Verified By: DAVID A. Martinique, M.D., MD    No Known Allergies:     Impression 59 yo male with history of hypertension who was readmitted for chest pain with radiation to his jaw and arms with st depression in inferior and lateral leads with pain. Ruled in for small nstemi 72 hours ago and now with recurrent symptoms at rest. Had negative funcitonal study 48 hours ago burt has recurrent symptoms with ekg changes.  Will need invasive evaluation of his anatomy. Risk and benefits of cardiac cath explained to patient and he agrees to proceed. Has CKD III with creatinine of 1.2 and GFR >60    Plan 1. Continue with asa, nitrates, beta blockers and statin 2. Rule out for mi 3. Left cardiac cath to evaluate anatomy 4. Further recs after cath. 5. Follow renal function post cath with hydration.   Electronic Signatures: Teodoro Spray (MD)  (Signed 19-Aug-13 08:41)  Authored: General Aspect/Present Illness, History and Physical Exam, Review of System, Past Medical History, Health Issues, Home Medications, EKG , Radiology, Allergies, Impression/Plan   Last Updated: 19-Aug-13 08:41 by Teodoro Spray (MD)

## 2014-12-24 NOTE — Discharge Summary (Signed)
PATIENT NAME:  [name] [name], ARDIZZONE MR#:  [id number] DATE OF BIRTH:  02/20/1956  DATE OF ADMISSION:  05/16/2013 DATE OF DISCHARGE:  05/16/2013  ADMITTING DIAGNOSIS: Mainly epigastric pain with some pain going up to his lower chest.   DISCHARGE DIAGNOSES: 1. Abdominal/chest pain very atypical in origin, likely gastrointestinal-related.  2. Possible focal colitis based on his CT scan. The patient is currently treated with Cipro and Flagyl orally. He will need outpatient follow-up with gastrointestinal.  3. Coronary artery disease with previous stents and four-vessel coronary artery bypass graft.  4. Hypertension.  5. Hyperlipidemia  6. Obstructive sleep apnea.   CONSULTANTS: Dr. [name].   PERTINENT LABS AND EVALUATIONS: Admitting glucose 129, BUN 19, creatinine 1.26, sodium 134, potassium 4.1, chloride 100, CO2 was 26. His lipase was normal at 124, calcium 10.2. LFTs were normal except total protein of 8.4, troponin was less than 0.0-2 x 3. His WBC count 11.9, hemoglobin 16.4, platelet count 218. Urinalysis was negative. His EKG showed  normal sinus rhythm. He had 0.5 mm ST depressions in anterior leads, most prominent in V4, V5, inferior leads T wave inversion. This was old compared to previous EKG. CT scan of the abdomen shows focal regional colitis within the mid sigmoid colon, may represent inflammatory colitis versus diverticulitis.   HOSPITAL COURSE: Please refer to H and P done by the admitting physician. The patient is a 59 year old white male with significant coronary artery disease, who presented with lower chest pain as well as abdominal pain. The patient has had similar presentations. He was seen in the ED a few days prior with similar complaints and was discharged home. The patient presented back with the same complaints. His EKG and cardiac enzymes were nonrevealing. His pain was felt to be atypical for cardiac cast. The patient also was seen by cardiology who felt that this was more  gastrointestinal-related pain than cardiac. I ordered a CT scan of his abdomen which showed focal colitis. Therefore, he is admitted and placed on Cipro and Flagyl. The patient is feeling much better and at this time, he is stable for discharge.   DISCHARGE MEDICATIONS: Multivitamins daily, aspirin 81 mg 1 tab p.o. daily, metoprolol 50 mg 1 tab p.o. t.i.d., lisinopril 40 daily, Plavix 75 p.o. daily, hydrochlorothiazide 25 p.o. daily, Norvasc 10 daily, atorvastatin 20 daily, vitamin D3 1000 international units daily, acetaminophen/hydrocodone 325/5  1 tab p.o. daily, ranitidine 150 b.i.d., Cipro 500 mg 1 tab p.o. q.12 x7 days, Flagyl 500 mg 1 tab p.o. q.8 x7 days.   DIET: Regular low sodium, low fat, low cholesterol.   ACTIVITY: As tolerated.   Follow-up with primary M.D. in 1 to 2 weeks. Follow up [facility] as a new patient for abdominal pain in 1 to 2 weeks. Follow with primary cardiology in 2 to 4 weeks.   NOTE: 32 minutes spent on the discharge.    ____________________________ Lafonda Mosses. Posey Pronto, MD shp:sg D: 05/17/2013 08:56:25 ET T: 05/17/2013 09:27:06 ET JOB#: [id number]  cc: Concepcion Gillott H. Posey Pronto, MD, <Dictator> Alric Seton MD ELECTRONICALLY SIGNED 05/17/2013 12:42

## 2014-12-24 NOTE — Consult Note (Signed)
PATIENT NAME:  [name] [name], [name] [name] MR#:  [id number] DATE OF BIRTH:  [birth year]  DATE OF CONSULTATION:  05/16/2013  CONSULTING PHYSICIAN:  Dwayne D. Callwood, MD  REFERRED BY:  [facility]   INDICATION: Chest pain, coronary artery disease and abdominal pain.   HISTORY OF PRESENT ILLNESS: [name] [name] is a 59 year old white male with history of coronary artery disease, status post coronary artery bypass surgery in 2013, PCI and stent in January 2014, hypertension, obstructive sleep apnea, started having epigastric and abdominal pain over the last day or so. He had some left-sided chest pain. The pain has been at rest and with exertion. It is not clear about how bad the pain is. Unable to describe the severity. He says it comes and goes, not related to food. He has had some shortness of breath. He denies any nausea, vomiting, or diarrhea. Came to the Emergency Room. EKG looked abnormal. It was not clear that EKG was severely different from before. Has been treated with Lovenox. Negative cardiac enzymes, would advise to be admitted for further evaluation and care.   REVIEW OF SYSTEMS:  No blackout spells or syncope. No nausea, vomiting. Denies fever, chills, sweats. No weight loss or weight gain. No hemoptysis or hematemesis. Denies    He has had mild abdominal discomfort.   SOCIAL HISTORY:  Ex-smoker. Occasional alcohol consumption.   FAMILY HISTORY:  Hypertension, diabetes.   ALLERGIES: None.   PAST MEDICAL HISTORY:  Coronary artery disease, hypertension, obstructive sleep apnea, hyperlipidemia.   PAST SURGICAL HISTORY: Coronary artery bypass surgery at [facility], angioplasty and stent at [place] by Dr. Donivan Scull.  MEDICATIONS:  Aspirin 81 mg a day, Lipitor 20 mg a day, hydrochlorothiazide once a day, lisinopril 20 a day, metoprolol 50 mg 3 times a day, multivitamin once a day, Norvasc 10 a day, Plavix 75 mg a day, vitamin D 3000 international units a day.   PHYSICAL EXAMINATION: VITAL SIGNS: Blood  pressure 180/70, pulse 90, respiratory rate 16, afebrile.  HEENT: Normocephalic, atraumatic. Pupils equal, reactive to light. NECK:  Supple. No significant JVD, bruits, or adenopathy.  LUNGS:  Clear to auscultation and percussion. No significant wheeze, rhonchi, or rales.  HEART: Regular rate and rhythm. Positive bowel sounds. No rebound, guarding, or tenderness.  EXTREMITIES: Within normal limits.  NEUROLOGIC: Intact.  SKIN: Normal.   LABORATORIES: Sodium 134, potassium 4.1, chloride 100, bicarb 26, BUN 19, creatinine 1.26, glucose 1.29. White count 11.9, hemoglobin 16.4, platelet count 218. UA was essentially negative so far. EKG: Normal sinus rhythm, rate of 80, nonspecific ST-T wave changes, with slight ST depression in inferior leads  IMPRESSIONS:  1.  Possible angina.  2.  Coronary artery disease. 3.  Abdominal pain, possible reflux. 4.  Hypertension. 5.  Hyperlipidemia. 6.  History of obstructive sleep apnea.   PLAN:   1.  Continue current therapy. Rule out for myocardial infarction. Follow up cardiac enzymes. Follow up EKGs. Short-term anticoagulation. Agree with echocardiogram. Continue Lopressor, lisinopril HCTZ, and Norvasc.   2.  Continue Plavix therapy with aspirin. Continue beta blocker and ACE inhibitor for coronary artery disease. Would also consider GI work up if he rules out for myocardial infarction, because  his angina history is a little different, he states the pain is very different from when he had his heart attack and had his previous procedures. He complains that the pain is more abdominal and slightly lower, so I am concerned about a GI work up at this point, and will treat the patient conservatively  from a cardiac standpoint until that is done.     ____________________________ Loran Senters. [name], MD ddc:mr D: 05/16/2013 19:46:36 ET T: 05/16/2013 22:03:56 ET JOB#: [id number]  cc: Dwayne D. [name], MD, <Dictator> Yolonda Kida MD ELECTRONICALLY SIGNED  06/03/2013 13:30

## 2014-12-24 NOTE — H&P (Signed)
PATIENT NAME:  [name] [name], [name] [name] MR#:  [id number] DATE OF BIRTH:  [birth year]  DATE OF ADMISSION:  05/16/2013  PRIMARY CARE PHYSICIAN:  [facility].  REFERRING PHYSICIAN:  Dr. Kerman Passey.  HISTORY OF PRESENT ILLNESS:  This is a 59 year old gentleman with history of coronary artery disease status post CABG in 2013, poorly-controlled hypertension, obstructive sleep apnea presenting with one day duration of epigastric pain with radiation to his left chest stating this is similar to his cardiac pain.  This pain occurred at rest.  He had no prior anginal symptoms preceding this.  Unable to qualify the pain other than it was painful.  Unable to quantify pain as it was just acute.  He describes no worsening or relieving factors.  As far as timing he states that it comes and goes all day and there is no relation to food.  He did however have associated shortness of breath.  He denied any nausea, vomiting, diarrhea or palpitations.  In the Emergency Department ED staff were concerned with EKG changes.  He was given aspirin as well as Lovenox.  Enzymes were negative x 1.  Currently, he is without symptoms including chest pain, shortness of breath.    REVIEW OF SYSTEMS: CONSTITUTIONAL:  Denies fever, chills, fatigue.  EYES:  Denies vision changes or eye pain.  EARS, NOSE, THROAT:  Denies ear pain or dysphagia.  RESPIRATORY:  Shortness of breath as above.  Denies cough.  CARDIOVASCULAR:  Chest pain as above.  Denies palpitations.  GENITOURINARY:  Denies any dysuria or hematuria.  ENDOCRINE:  Denies nocturia or thyroid problems.  HEMATOLOGY AND LYMPHATIC:  Denies bruising or bleeding.  MUSCULOSKELETAL:  Denies arthritis or back pain.  NEUROLOGIC:  Denies any paralysis or paresthesias.  PSYCHIATRIC:  Denies anxiety or depressive symptoms.   PAST MEDICAL HISTORY:  Coronary artery disease status post CABG in 2013 performed at [facility].  Cardiologist is Dr. Donivan Scull in [place].  He has also history of hypertension,  obstructive sleep apnea.   FAMILY HISTORY:  Significant for hypertension and diabetes in his parents.   SOCIAL HISTORY:  He is an ex-tobacco user, occasional alcohol usage.  Denies any drug usage.   ALLERGIES:  No known drug allergies.   HOME MEDICATIONS:  Include aspirin 81 mg by mouth daily atorvastatin 20 mg by mouth daily, hydrochlorothiazide 25 mg by mouth daily, lisinopril 40 mg by mouth daily, metoprolol 50 mg by mouth three times daily, multivitamin 1 tab by mouth daily, Norvasc 10 mg by mouth daily, Plavix 75 mg by mouth daily, vitamin D3 1000 international units by mouth daily.   PHYSICAL EXAMINATION: VITAL SIGNS:  Temperature 98.2, heart rate 89, respirations 20, blood pressure 190/81, saturating 99% on room air, BMI 28.4.  GENERAL:  No acute distress, awake, alert and oriented x 3.  HEENT:  Normocephalic, atraumatic.  Extraocular muscles intact.  Pupils equal, round, react to light as well as accommodation.  Moist mucosal membranes.  CARDIOVASCULAR:  S1 and S2, regular rate and rhythm.  No murmurs, rubs or gallops.  Chest pain reproducible on palpation, though he states this is a different pain.  A scar from previous CABG.  PULMONARY:  Clear to auscultation bilaterally without wheezes, rubs or rhonchi.  ABDOMEN:  Soft, nontender, nondistended, positive bowel sounds.  EXTREMITIES:  Reveal no cyanosis, edema or clubbing.  NEUROLOGIC:  Cranial nerves II through XII intact.  No gross focal neurological deficits.   LABORATORY DATA:  Sodium 134, potassium 4.1, chloride 100, bicarb 26, BUN 19, creatinine  1.26, glucose 129.  WBC 11.9, hemoglobin 16.4, platelets 218.  Urinalysis negative for signs of infection.  EKG, normal sinus rhythm, heart rate of 80.  He has approximately a 0.5 mm ST depression in the anterior lateral leads most prominent in V4, V5 with inferior lead T wave inversion.  This however is old from September 8th and September 7th when he presented to the Emergency Department  for hypertension, however these changes are new when compared to August 2013 prior to his CABG.   ASSESSMENT AND PLAN:  A 59 year old gentleman, history of coronary artery disease status post coronary artery bypass graft as well as hypertension, presenting with one day duration of atypical chest pain described as epigastric pain radiating to the left chest.  EKG shows T inversions in the inferior and ST depression in the anterior lateral which is hold, compared to EKGs from last week, however new compared to August 2013 prior to his coronary artery bypass graft.  Enzymes are negative x 1.  1.  Atypical chest pain.  No EKG changes for the most recent.  Regardless, trend cardiac enzymes.  Repeat the EKG in the morning as well as a transthoracic echocardiogram if not recently done.  2.  Hypertension.  Continue his Lopressor, lisinopril, hydrochlorothiazide and Norvasc.  3.  Coronary artery disease, status post coronary artery bypass graft.  Continue with aspirin, Plavix, beta-blocker, statin as well as an ACE inhibitor.  4.  The patient is a FULL CODE.   Time spent 33 minutes.     ____________________________ Aaron Mose. Hower, MD dkh:ea D: 05/16/2013 02:30:09 ET T: 05/16/2013 03:23:31 ET JOB#: [id number]  cc: Aaron Mose. Hower, MD, <Dictator> DAVID Woodfin Ganja MD ELECTRONICALLY SIGNED 05/16/2013 21:19

## 2015-02-09 ENCOUNTER — Encounter: Payer: Self-pay | Admitting: *Deleted

## 2015-02-09 DIAGNOSIS — R197 Diarrhea, unspecified: Secondary | ICD-10-CM | POA: Diagnosis not present

## 2015-02-09 DIAGNOSIS — Z79899 Other long term (current) drug therapy: Secondary | ICD-10-CM | POA: Insufficient documentation

## 2015-02-09 DIAGNOSIS — K625 Hemorrhage of anus and rectum: Secondary | ICD-10-CM | POA: Diagnosis not present

## 2015-02-09 DIAGNOSIS — Z7982 Long term (current) use of aspirin: Secondary | ICD-10-CM | POA: Insufficient documentation

## 2015-02-09 LAB — CBC
HCT: 47.1 % (ref 40.0–52.0)
Hemoglobin: 16.3 g/dL (ref 13.0–18.0)
MCH: 30.7 pg (ref 26.0–34.0)
MCHC: 34.5 g/dL (ref 32.0–36.0)
MCV: 88.8 fL (ref 80.0–100.0)
Platelets: 197 10*3/uL (ref 150–440)
RBC: 5.3 MIL/uL (ref 4.40–5.90)
RDW: 13.7 % (ref 11.5–14.5)
WBC: 10 10*3/uL (ref 3.8–10.6)

## 2015-02-09 LAB — PROTIME-INR
INR: 0.97
Prothrombin Time: 13.1 seconds (ref 11.4–15.0)

## 2015-02-09 NOTE — ED Notes (Signed)
Pt has diarrhea and rectal bleeding tonight.  No abd pain.  Hx diverticulitis.   Pt had diarrhea x 2.   Pt alert.  Skin warm and dry.

## 2015-02-10 ENCOUNTER — Encounter: Payer: Self-pay | Admitting: Emergency Medicine

## 2015-02-10 ENCOUNTER — Emergency Department
Admission: EM | Admit: 2015-02-10 | Discharge: 2015-02-10 | Disposition: A | Discharge status: Home / Self Care | Payer: Medicare Other | Attending: Emergency Medicine | Admitting: Emergency Medicine

## 2015-02-10 DIAGNOSIS — R197 Diarrhea, unspecified: Secondary | ICD-10-CM

## 2015-02-10 DIAGNOSIS — K625 Hemorrhage of anus and rectum: Secondary | ICD-10-CM

## 2015-02-10 HISTORY — DX: Diverticulitis of intestine, part unspecified, without perforation or abscess without bleeding: K57.92

## 2015-02-10 HISTORY — DX: Essential (primary) hypertension: I10

## 2015-02-10 LAB — COMPREHENSIVE METABOLIC PANEL
ALBUMIN: 4.7 g/dL (ref 3.5–5.0)
ALT: 83 U/L — ABNORMAL HIGH (ref 17–63)
AST: 76 U/L — ABNORMAL HIGH (ref 15–41)
Alkaline Phosphatase: 95 U/L (ref 38–126)
Anion gap: 12 (ref 5–15)
BILIRUBIN TOTAL: 1 mg/dL (ref 0.3–1.2)
BUN: 22 mg/dL — ABNORMAL HIGH (ref 6–20)
CHLORIDE: 98 mmol/L — AB (ref 101–111)
CO2: 28 mmol/L (ref 22–32)
CREATININE: 1.08 mg/dL (ref 0.61–1.24)
Calcium: 9.5 mg/dL (ref 8.9–10.3)
GFR calc Af Amer: >60 mL/min (ref >60)
Glucose, Bld: 108 mg/dL — ABNORMAL HIGH (ref 65–99)
Potassium: 3.4 mmol/L — ABNORMAL LOW (ref 3.5–5.1)
SODIUM: 138 mmol/L (ref 135–145)
Total Protein: 8.4 g/dL — ABNORMAL HIGH (ref 6.5–8.1)

## 2015-02-10 MED ORDER — METRONIDAZOLE 500 MG PO TABS: 500.0000 mg | ORAL_TABLET | Freq: Two times a day (BID) | ORAL | Status: DC

## 2015-02-10 MED ORDER — METRONIDAZOLE 500 MG PO TABS
500.0000 mg | ORAL_TABLET | Freq: Once | ORAL | Status: AC
  Administered 2015-02-10: 500 mg via ORAL

## 2015-02-10 MED ORDER — CIPROFLOXACIN HCL 500 MG PO TABS
500.0000 mg | ORAL_TABLET | Freq: Once | ORAL | Status: AC
  Administered 2015-02-10: 500 mg via ORAL

## 2015-02-10 MED ORDER — METRONIDAZOLE 500 MG PO TABS
ORAL_TABLET | ORAL | Status: AC
  Administered 2015-02-10: 500 mg via ORAL
  Filled 2015-02-10: qty 1

## 2015-02-10 MED ORDER — CIPROFLOXACIN HCL 500 MG PO TABS
ORAL_TABLET | ORAL | Status: AC
  Administered 2015-02-10: 500 mg via ORAL
  Filled 2015-02-10: qty 1

## 2015-02-10 MED ORDER — CIPROFLOXACIN HCL 500 MG PO TABS: 500.0000 mg | ORAL_TABLET | Freq: Two times a day (BID) | ORAL | Status: DC

## 2015-02-10 NOTE — Discharge Instructions (Signed)
1. Take antibiotics as prescribed (Cipro/Flagyl 500 mg twice daily 7 days). 2. Eat a BRAT diet 3 days, then slowly advance diet as tolerated. 3. Return to the ER for worsening symptoms, persistent bleeding, persistent vomiting, difficulty breathing or other concerns.  Bloody Stools Bloody stools often mean that there is a problem in the digestive tract. Your caregiver may use the term "melena" to describe black, tarry, and bad smelling stools or "hematochezia" to describe red or maroon-colored stools. Blood seen in the stool can be caused by bleeding anywhere along the intestinal tract.  A black stool usually means that blood is coming from the upper part of the gastrointestinal tract (esophagus, stomach, or small bowel). Passing maroon-colored stools or bright red blood usually means that blood is coming from lower down in the large bowel or the rectum. However, sometimes massive bleeding in the stomach or small intestine can cause bright red bloody stools.  Consuming black licorice, lead, iron pills, medicines containing bismuth subsalicylate, or blueberries can also cause black stools. Your caregiver can test black stools to see if blood is present. It is important that the cause of the bleeding be found. Treatment can then be started, and the problem can be corrected. Rectal bleeding may not be serious, but you should not assume everything is okay until you know the cause.It is very important to follow up with your caregiver or a specialist in gastrointestinal problems. CAUSES  Blood in the stools can come from various underlying causes.Often, the cause is not found during your first visit. Testing is often needed to discover the cause of bleeding in the gastrointestinal tract. Causes range from simple to serious or even life-threatening.Possible causes include:  Hemorrhoids.These are veins that are full of blood (engorged) in the rectum. They cause pain, inflammation, and may bleed.  Anal  fissures.These are areas of painful tearing which may bleed. They are often caused by passing hard stool.  Diverticulosis.These are pouches that form on the colon over time, with age, and may bleed significantly.  Diverticulitis.This is inflammation in areas with diverticulosis. It can cause pain, fever, and bloody stools, although bleeding is rare.  Proctitis and colitis. These are inflamed areas of the rectum or colon. They may cause pain, fever, and bloody stools.  Polyps and cancer. Colon cancer is a leading cause of preventable cancer death.It often starts out as precancerous polyps that can be removed during a colonoscopy, preventing progression into cancer. Sometimes, polyps and cancer may cause rectal bleeding.  Gastritis and ulcers.Bleeding from the upper gastrointestinal tract (near the stomach) may travel through the intestines and produce black, sometimes tarry, often bad smelling stools. In certain cases, if the bleeding is fast enough, the stools may not be black, but red and the condition may be life-threatening. SYMPTOMS  You may have stools that are bright red and bloody, that are normal color with blood on them, or that are dark black and tarry. In some cases, you may only have blood in the toilet bowl. Any of these cases need medical care. You may also have:  Pain at the anus or anywhere in the rectum.  Lightheadedness or feeling faint.  Extreme weakness.  Nausea or vomiting.  Fever. DIAGNOSIS Your caregiver may use the following methods to find the cause of your bleeding:  Taking a medical history. Age is important. Older people tend to develop polyps and cancer more often. If there is anal pain and a hard, large stool associated with bleeding, a tear of the  anus may be the cause. If blood drips into the toilet after a bowel movement, bleeding hemorrhoids may be the problem. The color and frequency of the bleeding are additional considerations. In most cases, the  medical history provides clues, but seldom the final answer.  A visual and finger (digital) exam. Your caregiver will inspect the anal area, looking for tears and hemorrhoids. A finger exam can provide information when there is tenderness or a growth inside. In men, the prostate is also examined.  Endoscopy. Several types of small, long scopes (endoscopes) are used to view the colon.  In the office, your caregiver may use a rigid, or more commonly, a flexible viewing sigmoidoscope. This exam is called flexible sigmoidoscopy. It is performed in 5 to 10 minutes.  A more thorough exam is accomplished with a colonoscope. It allows your caregiver to view the entire 5 to 6 foot long colon. Medicine to help you relax (sedative) is usually given for this exam. Frequently, a bleeding lesion may be present beyond the reach of the sigmoidoscope. So, a colonoscopy may be the best exam to start with. Both exams are usually done on an outpatient basis. This means the patient does not stay overnight in the hospital or surgery center.  An upper endoscopy may be needed to examine your stomach. Sedation is used and a flexible endoscope is put in your mouth, down to your stomach.  A barium enema X-ray. This is an X-ray exam. It uses liquid barium inserted by enema into the rectum. This test alone may not identify an actual bleeding point. X-rays highlight abnormal shadows, such as those made by lumps (tumors), diverticuli, or colitis. TREATMENT  Treatment depends on the cause of your bleeding.   For bleeding from the stomach or colon, the caregiver doing your endoscopy or colonoscopy may be able to stop the bleeding as part of the procedure.  Inflammation or infection of the colon can be treated with medicines.  Many rectal problems can be treated with creams, suppositories, or warm baths.  Surgery is sometimes needed.  Blood transfusions are sometimes needed if you have lost a lot of blood.  For any bleeding  problem, let your caregiver know if you take aspirin or other blood thinners regularly. HOME CARE INSTRUCTIONS   Take any medicines exactly as prescribed.  Keep your stools soft by eating a diet high in fiber. Prunes (1 to 3 a day) work well for many people.  Drink enough water and fluids to keep your urine clear or pale yellow.  Take sitz baths if advised. A sitz bath is when you sit in a bathtub with warm water for 10 to 15 minutes to soak, soothe, and cleanse the rectal area.  If enemas or suppositories are advised, be sure you know how to use them. Tell your caregiver if you have problems with this.  Monitor your bowel movements to look for signs of improvement or worsening. SEEK MEDICAL CARE IF:   You do not improve in the time expected.  Your condition worsens after initial improvement.  You develop any new symptoms. SEEK IMMEDIATE MEDICAL CARE IF:   You develop severe or prolonged rectal bleeding.  You vomit blood.  You feel weak or faint.  You have a fever. MAKE SURE YOU:  Understand these instructions.  Will watch your condition.  Will get help right away if you are not doing well or get worse. Document Released: 08/10/2002 Document Revised: 11/12/2011 Document Reviewed: 01/05/2011 [employer] Patient Information 2015 [employer]. This information is not intended to replace advice given to you by your health care provider. Make sure you discuss any questions you have with your health care provider.  Diarrhea Diarrhea is frequent loose and watery bowel movements. It can cause you to feel weak and dehydrated. Dehydration can cause you to become tired and thirsty, have a dry mouth, and have decreased urination that often is dark yellow. Diarrhea is a sign of another problem, most often an infection that will not last long. In most cases, diarrhea typically lasts 2-3 days. However, it can last longer if it is a sign of something more serious. It is important to treat  your diarrhea as directed by your caregiver to lessen or prevent future episodes of diarrhea. CAUSES  Some common causes include:  Gastrointestinal infections caused by viruses, bacteria, or parasites.  Food poisoning or food allergies.  Certain medicines, such as antibiotics, chemotherapy, and laxatives.  Artificial sweeteners and fructose.  Digestive disorders. HOME CARE INSTRUCTIONS  Ensure adequate fluid intake (hydration): Have 1 cup (8 oz) of fluid for each diarrhea episode. Avoid fluids that contain simple sugars or sports drinks, fruit juices, whole milk products, and sodas. Your urine should be clear or pale yellow if you are drinking enough fluids. Hydrate with an oral rehydration solution that you can purchase at pharmacies, retail stores, and online. You can prepare an oral rehydration solution at home by mixing the following ingredients together:   - tsp table salt.   tsp baking soda.   tsp salt substitute containing potassium chloride.  1  tablespoons sugar.  1 L (34 oz) of water.  Certain foods and beverages may increase the speed at which food moves through the gastrointestinal (GI) tract. These foods and beverages should be avoided and include:  Caffeinated and alcoholic beverages.  High-fiber foods, such as raw fruits and vegetables, nuts, seeds, and whole grain breads and cereals.  Foods and beverages sweetened with sugar alcohols, such as xylitol, sorbitol, and mannitol.  Some foods may be well tolerated and may help thicken stool including:  Starchy foods, such as rice, toast, pasta, low-sugar cereal, oatmeal, grits, baked potatoes, crackers, and bagels.  Bananas.  Applesauce.  Add probiotic-rich foods to help increase healthy bacteria in the GI tract, such as yogurt and fermented milk products.  Wash your hands well after each diarrhea episode.  Only take over-the-counter or prescription medicines as directed by your caregiver.  Take a warm bath  to relieve any burning or pain from frequent diarrhea episodes. SEEK IMMEDIATE MEDICAL CARE IF:   You are unable to keep fluids down.  You have persistent vomiting.  You have blood in your stool, or your stools are black and tarry.  You do not urinate in 6-8 hours, or there is only a small amount of very dark urine.  You have abdominal pain that increases or localizes.  You have weakness, dizziness, confusion, or light-headedness.  You have a severe headache.  Your diarrhea gets worse or does not get better.  You have a fever or persistent symptoms for more than 2-3 days.  You have a fever and your symptoms suddenly get worse. MAKE SURE YOU:   Understand these instructions.  Will watch your condition.  Will get help right away if you are not doing well or get worse. Document Released: 08/10/2002 Document Revised: 01/04/2014 Document Reviewed: 04/27/2012 [facility] Patient Information 2015 [place], [place]. This information is not intended to replace advice given to you by your health  care provider. Make sure you discuss any questions you have with your health care provider.

## 2015-02-10 NOTE — ED Notes (Signed)
Pt uprite on stretcher in exam room with no distress noted; pt reports 3 diarrheal stools since yesterday with blood noted; denies hx of same, denies any accomp symptoms

## 2015-02-10 NOTE — ED Provider Notes (Signed)
[facility] Emergency Department Provider Note  ____________________________________________  Time seen: Approximately 4:57 AM  I have reviewed the triage vital signs and the nursing notes.   HISTORY  Chief Complaint Rectal Bleeding and Diarrhea    HPI [name] Drew. is a 59 y.o. male who presents with a 6 day history of intermittent diarrhea and crampy abdominal pain. Patient noted 3 diarrhea stools since yesterday with bright red blood per rectum noted. Patient denies fever, chills, chest pain, shortness of breath, nausea, vomiting, headache, weakness, lightheadedness, numbness, tingling. Patient denies recent travel history. Patient does have a history of diverticulitis.   Past Medical History  Diagnosis Date  . Diverticulitis   . Hypertension     There are no active problems to display for this patient.   Past Surgical History  Procedure Laterality Date  . Coronary angioplasty with stent placement      x2  . Cardiac bypass      Current Outpatient Rx  Name  Route  Sig  Dispense  Refill  . allopurinol (ZYLOPRIM) 300 MG tablet   Oral   Take 300 mg by mouth daily.         Marland Kitchen amLODipine (NORVASC) 10 MG tablet   Oral   Take 10 mg by mouth daily.         Marland Kitchen aspirin EC 81 MG tablet   Oral   Take 81 mg by mouth daily.         Marland Kitchen atorvastatin (LIPITOR) 20 MG tablet   Oral   Take 20 mg by mouth daily.         . hydrochlorothiazide (HYDRODIURIL) 25 MG tablet   Oral   Take 25 mg by mouth daily.         Marland Kitchen losartan (COZAAR) 25 MG tablet   Oral   Take 25 mg by mouth daily.         . metoprolol (LOPRESSOR) 50 MG tablet   Oral   Take 50 mg by mouth 3 (three) times daily.         . ciprofloxacin (CIPRO) 500 MG tablet   Oral   Take 1 tablet (500 mg total) by mouth 2 (two) times daily.   14 tablet   0   . metroNIDAZOLE (FLAGYL) 500 MG tablet   Oral   Take 1 tablet (500 mg total) by mouth 2 (two) times daily.   14 tablet    0     Allergies Review of patient's allergies indicates no known allergies.  No family history on file.  Social History History  Substance Use Topics  . Smoking status: Never Smoker   . Smokeless tobacco: Not on file  . Alcohol Use: Yes    Review of Systems Constitutional: No fever/chills Eyes: No visual changes. ENT: No sore throat. Cardiovascular: Denies chest pain. Respiratory: Denies shortness of breath. Gastrointestinal: Positive for abdominal pain.  No nausea, no vomiting.  Positive for diarrhea.  No constipation. Genitourinary: Negative for dysuria. Musculoskeletal: Negative for back pain. Skin: Negative for rash. Neurological: Negative for headaches, focal weakness or numbness.  10-point ROS otherwise negative.  ____________________________________________   PHYSICAL EXAM:  VITAL SIGNS: ED Triage Vitals  Enc Vitals Group     BP 02/09/15 2311 167/89 mmHg     Pulse Rate 02/09/15 2311 84     Resp 02/09/15 2311 20     Temp 02/09/15 2311 98.8 F (37.1 C)     Temp Source 02/09/15 2311 Oral  SpO2 02/09/15 2311 99 %     Weight 02/09/15 2311 173 lb (78.472 kg)     Height 02/09/15 2311 5\' 3"  (1.6 m)     Head Cir --      Peak Flow --      Pain Score --      Pain Loc --      Pain Edu? --      Excl. in [place]? --     Constitutional: Alert and oriented. Well appearing and in no acute distress. Eyes: Conjunctivae are normal. PERRL. EOMI. Head: Atraumatic. Nose: No congestion/rhinnorhea. Mouth/Throat: Mucous membranes are moist.  Oropharynx non-erythematous. Neck: No stridor.   Cardiovascular: Normal rate, regular rhythm. Grossly normal heart sounds.  Good peripheral circulation. Respiratory: Normal respiratory effort.  No retractions. Lungs CTAB. Gastrointestinal: Soft and nontender. No distention. No abdominal bruits. No CVA tenderness. Rectal: Small, nonthrombosed external hemorrhoid noted. Tan stool on rectal exam which is trace heme  positive. Musculoskeletal: No lower extremity tenderness nor edema.  No joint effusions. Neurologic:  Normal speech and language. No gross focal neurologic deficits are appreciated. Speech is normal. No gait instability. Skin:  Skin is warm, dry and intact. No rash noted. Psychiatric: Mood and affect are normal. Speech and behavior are normal.  ____________________________________________   LABS (all labs ordered are listed, but only abnormal results are displayed)  Labs Reviewed  COMPREHENSIVE METABOLIC PANEL - Abnormal; Notable for the following:    Potassium 3.4 (*)    Chloride 98 (*)    Glucose, Bld 108 (*)    BUN 22 (*)    Total Protein 8.4 (*)    AST 76 (*)    ALT 83 (*)    All other components within normal limits  CBC  PROTIME-INR   ____________________________________________  EKG  None ____________________________________________  RADIOLOGY  None ____________________________________________   PROCEDURES  Procedure(s) performed: None  Critical Care performed: No  ____________________________________________   INITIAL IMPRESSION / ASSESSMENT AND PLAN / ED COURSE  Pertinent labs & imaging results that were available during my care of the patient were reviewed by me and considered in my medical decision making (see chart for details).  59 year old male who presents with a 6 day history of diarrhea with intermittent cramping abdominal pain; 3 bloody stools since yesterday. Labs notable for normal hemoglobin and hematocrit. Trace heme positive on guaiac exam with tan stool. BRBPR may be secondary to diarrhea causing bleeding to friable rectal mucosa; may also be presentation of mild diverticulitis. Discussed with patient and will treat empirically with Cipro/Flagyl. Follow-up with PCP next week. Strict return precautions given. Patient verbalizes understanding and agrees with plan of care. ____________________________________________   FINAL CLINICAL  IMPRESSION(S) / ED DIAGNOSES  Final diagnoses:  Rectal bleeding  Diarrhea      Paulette Blanch, MD 02/10/15 [id number]

## 2015-03-19 ENCOUNTER — Emergency Department
Admission: EM | Admit: 2015-03-19 | Discharge: 2015-03-20 | Disposition: A | Discharge status: Home / Self Care | Payer: Medicare Other | Attending: Emergency Medicine | Admitting: Emergency Medicine

## 2015-03-19 ENCOUNTER — Other Ambulatory Visit: Payer: Self-pay

## 2015-03-19 ENCOUNTER — Emergency Department: Payer: Medicare Other

## 2015-03-19 ENCOUNTER — Encounter: Payer: Self-pay | Admitting: Emergency Medicine

## 2015-03-19 DIAGNOSIS — Z79899 Other long term (current) drug therapy: Secondary | ICD-10-CM | POA: Insufficient documentation

## 2015-03-19 DIAGNOSIS — Z87891 Personal history of nicotine dependence: Secondary | ICD-10-CM | POA: Insufficient documentation

## 2015-03-19 DIAGNOSIS — R1033 Periumbilical pain: Secondary | ICD-10-CM

## 2015-03-19 DIAGNOSIS — Z7982 Long term (current) use of aspirin: Secondary | ICD-10-CM | POA: Insufficient documentation

## 2015-03-19 DIAGNOSIS — I10 Essential (primary) hypertension: Secondary | ICD-10-CM | POA: Diagnosis not present

## 2015-03-19 DIAGNOSIS — K4020 Bilateral inguinal hernia, without obstruction or gangrene, not specified as recurrent: Secondary | ICD-10-CM | POA: Insufficient documentation

## 2015-03-19 HISTORY — DX: Gout, unspecified: M10.9

## 2015-03-19 LAB — COMPREHENSIVE METABOLIC PANEL
ALK PHOS: 101 U/L (ref 38–126)
ALT: 51 U/L (ref 17–63)
ANION GAP: 12 (ref 5–15)
AST: 43 U/L — ABNORMAL HIGH (ref 15–41)
Albumin: 4.8 g/dL (ref 3.5–5.0)
BUN: 20 mg/dL (ref 6–20)
CO2: 24 mmol/L (ref 22–32)
CREATININE: 1.22 mg/dL (ref 0.61–1.24)
Calcium: 9.1 mg/dL (ref 8.9–10.3)
Chloride: 102 mmol/L (ref 101–111)
GFR calc Af Amer: >60 mL/min (ref >60)
Glucose, Bld: 157 mg/dL — ABNORMAL HIGH (ref 65–99)
Potassium: 3.1 mmol/L — ABNORMAL LOW (ref 3.5–5.1)
SODIUM: 138 mmol/L (ref 135–145)
Total Bilirubin: 0.8 mg/dL (ref 0.3–1.2)
Total Protein: 8.4 g/dL — ABNORMAL HIGH (ref 6.5–8.1)

## 2015-03-19 LAB — CBC WITH DIFFERENTIAL/PLATELET
Basophils Absolute: 0.1 10*3/uL (ref 0–0.1)
Basophils Relative: 1 %
EOS ABS: 0.2 10*3/uL (ref 0–0.7)
EOS PCT: 3 %
HEMATOCRIT: 42.9 % (ref 40.0–52.0)
HEMOGLOBIN: 15.1 g/dL (ref 13.0–18.0)
LYMPHS PCT: 25 %
Lymphs Abs: 1.9 10*3/uL (ref 1.0–3.6)
MCH: 31.1 pg (ref 26.0–34.0)
MCHC: 35.3 g/dL (ref 32.0–36.0)
MCV: 88.1 fL (ref 80.0–100.0)
Monocytes Absolute: 0.7 10*3/uL (ref 0.2–1.0)
Monocytes Relative: 9 %
Neutro Abs: 5 10*3/uL (ref 1.4–6.5)
Neutrophils Relative %: 62 %
PLATELETS: 167 10*3/uL (ref 150–440)
RBC: 4.87 MIL/uL (ref 4.40–5.90)
RDW: 14 % (ref 11.5–14.5)
WBC: 8 10*3/uL (ref 3.8–10.6)

## 2015-03-19 LAB — LIPASE, BLOOD: LIPASE: 29 U/L (ref 22–51)

## 2015-03-19 NOTE — ED Notes (Signed)
Pt reports pain at 10 o'clock of umbilicus 8/02 pain sharp with palpation, pt grunting and wheezing while breathing in triage and reports it's related to the pain.  Pt has hx of CABG x4 bypass and x2 stints.

## 2015-03-20 ENCOUNTER — Emergency Department: Payer: Medicare Other

## 2015-03-20 MED ORDER — TRAMADOL HCL 50 MG PO TABS: 50.0000 mg | ORAL_TABLET | Freq: Four times a day (QID) | ORAL | Status: DC | PRN

## 2015-03-20 MED ORDER — SODIUM CHLORIDE 0.9 % IV BOLUS (SEPSIS)
500.0000 mL | Freq: Once | INTRAVENOUS | Status: AC
  Administered 2015-03-20: 500 mL via INTRAVENOUS

## 2015-03-20 MED ORDER — IOHEXOL 240 MG/ML SOLN
25.0000 mL | Freq: Once | INTRAMUSCULAR | Status: AC | PRN
  Administered 2015-03-20: 25 mL via ORAL

## 2015-03-20 MED ORDER — IOHEXOL 300 MG/ML  SOLN
100.0000 mL | Freq: Once | INTRAMUSCULAR | Status: AC | PRN
  Administered 2015-03-20: 100 mL via INTRAVENOUS

## 2015-03-20 NOTE — Discharge Instructions (Signed)

## 2015-03-20 NOTE — ED Provider Notes (Signed)
[facility] Emergency Department Provider Note  ____________________________________________  Time seen: 00 45  I have reviewed the triage vital signs and the nursing notes.   HISTORY  Chief Complaint Abdominal Pain     HPI [name] [name]. is a 59 y.o. male with a history of cardiac disease and a bypass in 2013 who presents to the emergency department with a complaint of acute onset abdominal pain very close to his umbilicus. This pain began around 01/05/1929. This was before he had eaten dinner. He was able to eat some food and he has had a bowel movement since the onset of pain.   Patient reports he has not had pain like this before. It's just above and to the right of his umbilicus. He reports he could feel a bump, a bulge, right where the pain was. He has never had any abdominal surgeries. There is no scar indicative of laparoscopy. He has no known history of ventral hernias or abdominal wall defects.  He denies any nausea or vomiting. He denies any flatulence this evening, though as mentioned above, he has had a bowel movement since the pain is started.   Past Medical History  Diagnosis Date  . Diverticulitis   . Hypertension   . Gout     There are no active problems to display for this patient.   Past Surgical History  Procedure Laterality Date  . Coronary angioplasty with stent placement      x2  . Cardiac bypass    . Cardiac surgery      Current Outpatient Rx  Name  Route  Sig  Dispense  Refill  . allopurinol (ZYLOPRIM) 300 MG tablet   Oral   Take 300 mg by mouth daily.         Marland Kitchen amLODipine (NORVASC) 10 MG tablet   Oral   Take 10 mg by mouth daily.         Marland Kitchen aspirin EC 81 MG tablet   Oral   Take 81 mg by mouth daily.         Marland Kitchen atorvastatin (LIPITOR) 20 MG tablet   Oral   Take 20 mg by mouth daily.         . hydrochlorothiazide (HYDRODIURIL) 25 MG tablet   Oral   Take 25 mg by mouth daily.         Marland Kitchen losartan  (COZAAR) 25 MG tablet   Oral   Take 25 mg by mouth daily.         . metoprolol (LOPRESSOR) 50 MG tablet   Oral   Take 50 mg by mouth 3 (three) times daily.         . traMADol (ULTRAM) 50 MG tablet   Oral   Take 1 tablet (50 mg total) by mouth every 6 (six) hours as needed.   20 tablet   0     Allergies Review of patient's allergies indicates no known allergies.  History reviewed. No pertinent family history.  Social History History  Substance Use Topics  . Smoking status: Former Smoker -- 25 years  . Smokeless tobacco: Not on file  . Alcohol Use: 1.2 oz/week    2 Shots of liquor per week     Comment: 14 shot per week    Review of Systems  Constitutional: Negative for fever. ENT: Negative for sore throat. Cardiovascular: Negative for chest pain. History of cardiac disease with a bypass in 2013. Respiratory: Negative for shortness of breath. Gastrointestinal:  Positive for abdominal pain. I get a 4 vomiting and diarrhea. Genitourinary: Negative for dysuria. Musculoskeletal: No myalgias or injuries. Skin: Negative for rash. Neurological: Negative for headaches   10-point ROS otherwise negative.  ____________________________________________   PHYSICAL EXAM:  VITAL SIGNS: ED Triage Vitals  Enc Vitals Group     BP 03/19/15 2132 153/92 mmHg     Pulse Rate 03/19/15 2132 79     Resp 03/19/15 2132 20     Temp 03/19/15 2132 97.6 F (36.4 C)     Temp Source 03/19/15 2132 Oral     SpO2 03/19/15 2132 100 %     Weight 03/19/15 2132 172 lb (78.019 kg)     Height 03/19/15 2132 5\' 3"  (1.6 m)     Head Cir --      Peak Flow --      Pain Score 03/19/15 2135 8     Pain Loc --      Pain Edu? --      Excl. in [place]? --     Constitutional: Alert and oriented. Well appearing and in no distress. ENT   Head: Normocephalic and atraumatic.   Nose: No congestion/rhinnorhea.   Mouth/Throat: Mucous membranes are moist. Cardiovascular: Normal rate, regular rhythm,  no murmur noted   Notable scar tissue status post CABG. The patient has keloid formation. Respiratory:  Normal respiratory effort, no tachypnea.    Breath sounds are clear and equal bilaterally.  Gastrointestinal: Soft, with some focal tenderness just superior and to the right of his umbilicus. There is a very small palpable nodule or bump in the area. I have pushed on this in a steady manner hoping that if this was a hernia we could reduce it. The patient's tenderness was too great for ongoing reduction attempts. Bowel sounds are normal. The rest of the abdomen is benign.  Back: No muscle spasm, no tenderness, no CVA tenderness. Musculoskeletal: No deformity noted. Nontender with normal range of motion in all extremities.  No noted edema. Neurologic:  Normal speech and language. No gross focal neurologic deficits are appreciated.  Skin:  Skin is warm, dry. No rash noted. Keloid formation at his bypass site on his chest is noted.  Psychiatric: Mood and affect are normal. Speech and behavior are normal.  ____________________________________________    LABS (pertinent positives/negatives)  Labs Reviewed  COMPREHENSIVE METABOLIC PANEL - Abnormal; Notable for the following:    Potassium 3.1 (*)    Glucose, Bld 157 (*)    Total Protein 8.4 (*)    AST 43 (*)    All other components within normal limits  CBC WITH DIFFERENTIAL/PLATELET  LIPASE, BLOOD     ____________________________________________   EKG  ED ECG REPORT I, Ananda Caya W, the attending physician, personally viewed and interpreted this ECG.   Date: 03/20/2015  EKG Time: 2147  Rate: 78  Rhythm: sinus rhythm with occasional PVC. There are 2 noted on the EKG strip.  Axis: Normal  Intervals: QTC is slightly prolonged at 487.  ST&T Change: Flipped T-wave in lead 3 and aVF.   ____________________________________________    RADIOLOGY  Chest x-ray: Negative  CT scan abdomen and pelvis:  IMPRESSION: No acute  process in the abdomen or pelvis. Fatty infiltration of the liver. Duodenum diverticulum. Small right inguinal hernia containing a portion of the bladder. Small left inguinal hernia containing fat.   ____________________________________________ ____________________________________________   INITIAL IMPRESSION / ASSESSMENT AND PLAN / ED COURSE  Pertinent labs & imaging results that were available during  my care of the patient were reviewed by me and considered in my medical decision making (see chart for details).  Pleasant alert 59 year old male with focal tenderness in his abdominal wall. I suspect she may have a small herniation that may have omentum in it. I think it is less likely that he has a larger herniation with bowel, but a CT scan is in order to ensure this and to be sure there is no other cause for this bump with focal pain.  Patient agrees. While he is a little bit uncomfortable, he is declining pain medicine at this time because he would like to give the drive home if his CT scan does not reveal any more significant pathology.  ----------------------------------------- 3:33 AM on 03/20/2015 -----------------------------------------  CT scan is negative for dental or umbilical hernia. Patient does have a small right inguinal hernia and a small left inguinal hernia.  This tender nodular area appears to be within the outer wall itself.  Reassessment at this time finds the patient comfortable. We'll discharge him home with a prescription for tramadol to be used as needed. He can follow with his regular doctors at [facility] clinic.  ____________________________________________   FINAL CLINICAL IMPRESSION(S) / ED DIAGNOSES  Final diagnoses:  Periumbilical abdominal pain  bilateral inguinal hernias    Ahmed Prima, MD 03/20/15 [id number]

## 2015-03-20 NOTE — ED Notes (Signed)
Patient has finished CT contrast.  CT tech [place] notified.

## 2015-03-20 NOTE — ED Notes (Signed)
Pt returned from CT °

## 2015-04-28 ENCOUNTER — Encounter: Payer: Self-pay | Admitting: *Deleted

## 2015-04-29 ENCOUNTER — Ambulatory Visit: Payer: Medicare Other | Admitting: Anesthesiology

## 2015-04-29 ENCOUNTER — Ambulatory Visit
Admission: RE | Admit: 2015-04-29 | Discharge: 2015-04-29 | Disposition: A | Discharge status: Home / Self Care | Payer: Medicare Other | Source: Ambulatory Visit | Attending: Gastroenterology | Admitting: Gastroenterology

## 2015-04-29 ENCOUNTER — Encounter: Payer: Self-pay | Admitting: *Deleted

## 2015-04-29 ENCOUNTER — Encounter
Admission: RE | Disposition: A | Discharge status: Home / Self Care | Payer: Self-pay | Source: Ambulatory Visit | Attending: Gastroenterology

## 2015-04-29 DIAGNOSIS — Z7982 Long term (current) use of aspirin: Secondary | ICD-10-CM | POA: Diagnosis not present

## 2015-04-29 DIAGNOSIS — K219 Gastro-esophageal reflux disease without esophagitis: Secondary | ICD-10-CM | POA: Insufficient documentation

## 2015-04-29 DIAGNOSIS — I10 Essential (primary) hypertension: Secondary | ICD-10-CM | POA: Insufficient documentation

## 2015-04-29 DIAGNOSIS — D125 Benign neoplasm of sigmoid colon: Secondary | ICD-10-CM | POA: Diagnosis not present

## 2015-04-29 DIAGNOSIS — K625 Hemorrhage of anus and rectum: Secondary | ICD-10-CM | POA: Insufficient documentation

## 2015-04-29 DIAGNOSIS — K644 Residual hemorrhoidal skin tags: Secondary | ICD-10-CM | POA: Diagnosis not present

## 2015-04-29 DIAGNOSIS — I252 Old myocardial infarction: Secondary | ICD-10-CM | POA: Insufficient documentation

## 2015-04-29 DIAGNOSIS — M109 Gout, unspecified: Secondary | ICD-10-CM | POA: Diagnosis not present

## 2015-04-29 DIAGNOSIS — Z87891 Personal history of nicotine dependence: Secondary | ICD-10-CM | POA: Diagnosis not present

## 2015-04-29 DIAGNOSIS — I2510 Atherosclerotic heart disease of native coronary artery without angina pectoris: Secondary | ICD-10-CM | POA: Insufficient documentation

## 2015-04-29 DIAGNOSIS — Z951 Presence of aortocoronary bypass graft: Secondary | ICD-10-CM | POA: Diagnosis not present

## 2015-04-29 DIAGNOSIS — E785 Hyperlipidemia, unspecified: Secondary | ICD-10-CM | POA: Diagnosis not present

## 2015-04-29 DIAGNOSIS — I428 Other cardiomyopathies: Secondary | ICD-10-CM | POA: Diagnosis not present

## 2015-04-29 DIAGNOSIS — G4730 Sleep apnea, unspecified: Secondary | ICD-10-CM | POA: Diagnosis not present

## 2015-04-29 DIAGNOSIS — K529 Noninfective gastroenteritis and colitis, unspecified: Secondary | ICD-10-CM | POA: Insufficient documentation

## 2015-04-29 DIAGNOSIS — Z79899 Other long term (current) drug therapy: Secondary | ICD-10-CM | POA: Insufficient documentation

## 2015-04-29 DIAGNOSIS — K5730 Diverticulosis of large intestine without perforation or abscess without bleeding: Secondary | ICD-10-CM | POA: Diagnosis not present

## 2015-04-29 DIAGNOSIS — K222 Esophageal obstruction: Secondary | ICD-10-CM | POA: Diagnosis not present

## 2015-04-29 HISTORY — DX: Cardiomyopathy, unspecified: I42.9

## 2015-04-29 HISTORY — DX: Hyperlipidemia, unspecified: E78.5

## 2015-04-29 HISTORY — PX: COLONOSCOPY WITH PROPOFOL: SHX5780

## 2015-04-29 HISTORY — DX: Atherosclerotic heart disease of native coronary artery without angina pectoris: I25.10

## 2015-04-29 HISTORY — DX: Sleep apnea, unspecified: G47.30

## 2015-04-29 HISTORY — PX: ESOPHAGOGASTRODUODENOSCOPY (EGD) WITH PROPOFOL: SHX5813

## 2015-04-29 SURGERY — COLONOSCOPY WITH PROPOFOL
Anesthesia: General

## 2015-04-29 MED ORDER — GLYCOPYRROLATE 0.2 MG/ML IJ SOLN
INTRAMUSCULAR | Status: DC | PRN
  Administered 2015-04-29: 0.2 mg via INTRAVENOUS

## 2015-04-29 MED ORDER — PROPOFOL 10 MG/ML IV BOLUS
INTRAVENOUS | Status: DC | PRN
  Administered 2015-04-29: 120 mg via INTRAVENOUS
  Administered 2015-04-29: 40 mg via INTRAVENOUS
  Administered 2015-04-29: 30 mg via INTRAVENOUS

## 2015-04-29 MED ORDER — PHENYLEPHRINE HCL 10 MG/ML IJ SOLN
INTRAMUSCULAR | Status: DC | PRN
  Administered 2015-04-29: 100 ug via INTRAVENOUS
  Administered 2015-04-29 (×2): 200 ug via INTRAVENOUS

## 2015-04-29 MED ORDER — SODIUM CHLORIDE 0.9 % IV SOLN: INTRAVENOUS | Status: DC

## 2015-04-29 MED ORDER — SODIUM CHLORIDE 0.9 % IV SOLN
INTRAVENOUS | Status: DC
  Administered 2015-04-29: 07:00:00 via INTRAVENOUS

## 2015-04-29 MED ORDER — PROPOFOL INFUSION 10 MG/ML OPTIME
INTRAVENOUS | Status: DC | PRN
  Administered 2015-04-29: 150 ug/kg/min via INTRAVENOUS

## 2015-04-29 MED ORDER — LIDOCAINE HCL (CARDIAC) 20 MG/ML IV SOLN
INTRAVENOUS | Status: DC | PRN
  Administered 2015-04-29: 20 mg via INTRAVENOUS

## 2015-04-29 NOTE — Anesthesia Procedure Notes (Signed)
Date/Time: 04/29/2015 8:13 AM Performed by: Martha Clan Pre-anesthesia Checklist: Patient identified, Emergency Drugs available, Suction available, Patient being monitored and Timeout performed Patient Re-evaluated:Patient Re-evaluated prior to inductionOxygen Delivery Method: Nasal cannula Preoxygenation: Pre-oxygenation with 100% oxygen Intubation Type: IV induction Dental Injury: Teeth and Oropharynx as per pre-operative assessment

## 2015-04-29 NOTE — H&P (Addendum)
Primary Care Physician:  Pcp Not In System Primary Gastroenterologist:  Dr. Candace Cruise  Pre-Procedure History & Physical: HPI:  [name] Bellin. is a 59 y.o. male is here for an EGD/colonoscopy.  Past Medical History  Diagnosis Date  . Diverticulitis   . Hypertension   . Gout   . Sleep apnea   . Hyperlipidemia   . Cardiomyopathy, secondary   . Coronary artery disease     Past Surgical History  Procedure Laterality Date  . Coronary angioplasty with stent placement      x2  . Cardiac bypass    . Cardiac surgery    . Coronary artery bypass graft      Prior to Admission medications   Medication Sig Start Date End Date Taking? Authorizing Provider  Cholecalciferol 1000 UNITS capsule Take 1,000 Units by mouth daily.   Yes Historical Provider, MD  metoprolol (LOPRESSOR) 50 MG tablet Take 50 mg by mouth 3 (three) times daily.   Yes Historical Provider, MD  omeprazole (PRILOSEC) 40 MG capsule Take 40 mg by mouth daily.   Yes Historical Provider, MD  allopurinol (ZYLOPRIM) 300 MG tablet Take 300 mg by mouth daily.    Historical Provider, MD  amLODipine (NORVASC) 10 MG tablet Take 10 mg by mouth daily.    Historical Provider, MD  aspirin EC 81 MG tablet Take 81 mg by mouth daily.    Historical Provider, MD  atorvastatin (LIPITOR) 20 MG tablet Take 20 mg by mouth daily.    Historical Provider, MD  hydrochlorothiazide (HYDRODIURIL) 25 MG tablet Take 25 mg by mouth daily.    Historical Provider, MD  losartan (COZAAR) 25 MG tablet Take 25 mg by mouth daily.    Historical Provider, MD  traMADol (ULTRAM) 50 MG tablet Take 1 tablet (50 mg total) by mouth every 6 (six) hours as needed. 03/20/15 03/19/16  Ahmed Prima, MD    Allergies as of 03/29/2015  . (No Known Allergies)    History reviewed. No pertinent family history.  Social History   Social History  . Marital Status: Single    Spouse Name: N/A  . Number of Children: N/A  . Years of Education: N/A   Occupational History  .  Not on file.   Social History Main Topics  . Smoking status: Former Smoker -- 25 years  . Smokeless tobacco: Not on file  . Alcohol Use: 1.2 oz/week    2 Shots of liquor per week     Comment: 14 shot per week  . Drug Use: Not on file  . Sexual Activity: Not on file   Other Topics Concern  . Not on file   Social History Narrative    Review of Systems: See HPI, otherwise negative ROS  Physical Exam: BP 142/81 mmHg  Pulse 68  Temp(Src) 97.2 F (36.2 C) (Tympanic)  Resp 18  Ht 5\' 3"  (1.6 m)  Wt 78.019 kg (172 lb)  BMI 30.48 kg/m2  SpO2 96% General:   Alert,  pleasant and cooperative in NAD Head:  Normocephalic and atraumatic. Neck:  Supple; no masses or thyromegaly. Lungs:  Clear throughout to auscultation.    Heart:  Regular rate and rhythm. Abdomen:  Soft, nontender and nondistended. Normal bowel sounds, without guarding, and without rebound.   Neurologic:  Alert and  oriented x4;  grossly normal neurologically.  Impression/Plan: [name] Seeds. is here for an EGD/colonoscopy to be performed for rectal bleeding/diarrhea and GERD/dysphagia.  Risks, benefits, limitations, and  alternatives regarding  EGD/colonoscopy have been reviewed with the patient.  Questions have been answered.  All parties agreeable.   [name] [name], Lupita Dawn, MD  04/29/2015, 8:05 AM

## 2015-04-29 NOTE — Transfer of Care (Signed)
Immediate Anesthesia Transfer of Care Note  Patient: [name] [name].  Procedure(s) Performed: Procedure(s): COLONOSCOPY WITH PROPOFOL (N/A) ESOPHAGOGASTRODUODENOSCOPY (EGD) WITH PROPOFOL (N/A)  Patient Location: Endoscopy Unit  Anesthesia Type:General  Level of Consciousness: sedated  Airway & Oxygen Therapy: Patient Spontanous Breathing and Patient connected to nasal cannula oxygen  Post-op Assessment: Report given to RN and Post -op Vital signs reviewed and stable  Post vital signs: Reviewed and stable  Last Vitals:  Filed Vitals:   04/29/15 0711  BP: 142/81  Pulse: 68  Temp: 36.2 C  Resp: 18    Complications: No apparent anesthesia complications

## 2015-04-29 NOTE — Op Note (Signed)
[facility] Gastroenterology Patient Name: [name] [name] Procedure Date: 04/29/2015 7:50 AM MRN: [id number] Account #: [id number] Date of Birth: 01/18/1956 Admit Type: Outpatient Age: 59 Room: [facility] ENDO ROOM 4 Gender: Male Note Status: Finalized Procedure:         Upper GI endoscopy Indications:       Dysphagia Providers:         Lupita Dawn. Candace Cruise, MD Medicines:         Monitored Anesthesia Care Complications:     No immediate complications. Procedure:         Pre-Anesthesia Assessment:                    - Prior to the procedure, a History and Physical was                     performed, and patient medications, allergies and                     sensitivities were reviewed. The patient's tolerance of                     previous anesthesia was reviewed.                    - The risks and benefits of the procedure and the sedation                     options and risks were discussed with the patient. All                     questions were answered and informed consent was obtained.                    - After reviewing the risks and benefits, the patient was                     deemed in satisfactory condition to undergo the procedure.                    After obtaining informed consent, the endoscope was passed                     under direct vision. Throughout the procedure, the                     patient's blood pressure, pulse, and oxygen saturations                     were monitored continuously. The Endoscope was introduced                     through the mouth, and advanced to the second part of                     duodenum. The upper GI endoscopy was accomplished without                     difficulty. The patient tolerated the procedure well. Findings:      A benign-appearing, intrinsic moderate stenosis was found at the       gastroesophageal junction. The scope was withdrawn. Dilation was       performed with a Maloney dilator with mild resistance at 63 Fr.     The exam was otherwise without  abnormality.      The entire examined stomach was normal.      The examined duodenum was normal. Impression:        - Benign-appearing esophageal stricture. Dilated.                    - The examination was otherwise normal.                    - Normal stomach.                    - Normal examined duodenum.                    - No specimens collected. Recommendation:    - Discharge patient to home.                    - Observe patient's clinical course.                    - The findings and recommendations were discussed with the                     patient. Procedure Code(s): --- Professional ---                    [phone], Esophagogastroduodenoscopy, flexible, transoral;                     diagnostic, including collection of specimen(s) by                     brushing or washing, when performed (separate procedure)                    43450, Dilation of esophagus, by unguided sound or bougie,                     single or multiple passes Diagnosis Code(s): --- Professional ---                    K22.2, Esophageal obstruction                    R13.10, Dysphagia, unspecified CPT copyright 2014 American Medical Association. All rights reserved. The codes documented in this report are preliminary and upon coder review may  be revised to meet current compliance requirements. Hulen Luster, MD 04/29/2015 8:23:16 AM This report has been signed electronically. Number of Addenda: 0 Note Initiated On: 04/29/2015 7:50 AM      [facility]

## 2015-04-29 NOTE — Anesthesia Preprocedure Evaluation (Signed)
Anesthesia Evaluation  Patient identified by MRN, date of birth, ID band Patient awake    Reviewed: Allergy & Precautions, H&P , NPO status , Patient's Chart, lab work & pertinent test results, reviewed documented beta blocker date and time   History of Anesthesia Complications Negative for: history of anesthetic complications  Airway Mallampati: II  TM Distance: >3 FB Neck ROM: full    Dental no notable dental hx. (+) Teeth Intact, Missing   Pulmonary neg shortness of breath, sleep apnea , neg COPDneg recent URI, former smoker,  breath sounds clear to auscultation  Pulmonary exam normal       Cardiovascular Exercise Tolerance: Good hypertension, - angina+ CAD, + Past MI, + Cardiac Stents, + CABG (4 vessel in 2013) and +CHF Normal cardiovascular exam- dysrhythmias - Valvular Problems/MurmursRhythm:regular Rate:Normal     Neuro/Psych negative neurological ROS  negative psych ROS   GI/Hepatic Neg liver ROS, GERD-  ,  Endo/Other  negative endocrine ROS  Renal/GU negative Renal ROS  negative genitourinary   Musculoskeletal   Abdominal   Peds  Hematology negative hematology ROS (+)   Anesthesia Other Findings Past Medical History:   Diverticulitis                                               Hypertension                                                 Gout                                                         Sleep apnea                                                  Hyperlipidemia                                               Cardiomyopathy, secondary                                    Coronary artery disease                                      Reproductive/Obstetrics negative OB ROS                             Anesthesia Physical Anesthesia Plan  ASA: III  Anesthesia Plan: General   Post-op Pain Management:    Induction:   Airway Management Planned:   Additional Equipment:    Intra-op Plan:   Post-operative Plan:   Informed  Consent: I have reviewed the patients History and Physical, chart, labs and discussed the procedure including the risks, benefits and alternatives for the proposed anesthesia with the patient or authorized representative who has indicated his/her understanding and acceptance.   Dental Advisory Given  Plan Discussed with: Anesthesiologist, CRNA and Surgeon  Anesthesia Plan Comments:         Anesthesia Quick Evaluation

## 2015-04-29 NOTE — Op Note (Signed)
[facility] Gastroenterology Patient Name: [name] [name] Procedure Date: 04/29/2015 7:51 AM MRN: [id number] Account #: [id number] Date of Birth: 03-08-1956 Admit Type: Outpatient Age: 59 Room: [facility] ENDO ROOM 4 Gender: Male Note Status: Finalized Procedure:         Colonoscopy Indications:       Rectal bleeding, Chronic diarrhea Providers:         Lupita Dawn. Candace Cruise, MD Medicines:         Monitored Anesthesia Care Complications:     No immediate complications. Procedure:         Pre-Anesthesia Assessment:                    - Prior to the procedure, a History and Physical was                     performed, and patient medications, allergies and                     sensitivities were reviewed. The patient's tolerance of                     previous anesthesia was reviewed.                    - The risks and benefits of the procedure and the sedation                     options and risks were discussed with the patient. All                     questions were answered and informed consent was obtained.                    - After reviewing the risks and benefits, the patient was                     deemed in satisfactory condition to undergo the procedure.                    After obtaining informed consent, the colonoscope was                     passed under direct vision. Throughout the procedure, the                     patient's blood pressure, pulse, and oxygen saturations                     were monitored continuously. The Colonoscope was                     introduced through the anus and advanced to the the cecum,                     identified by appendiceal orifice and ileocecal valve. The                     colonoscopy was performed without difficulty. The patient                     tolerated the procedure well. The quality of the bowel                     preparation was fair. Findings:  Multiple small and large-mouthed diverticula were found in the sigmoid        colon.      A diminutive polyp was found in the sigmoid colon. The polyp was       sessile. The polyp was removed with a jumbo cold forceps. Resection and       retrieval were complete.      The exam was otherwise without abnormality.      The perianal exam findings include non-thrombosed external hemorrhoids. Impression:        - Diverticulosis in the sigmoid colon.                    - One diminutive polyp in the sigmoid colon. Resected and                     retrieved.                    - The examination was otherwise normal.                    - Non-thrombosed external hemorrhoids found on perianal                     exam. Recommendation:    - Discharge patient to home.                    - Repeat colonoscopy in 5 years for surveillance based on                     pathology results.                    - High fiber diet daily.                    - The findings and recommendations were discussed with the                     patient.                    - Await pathology results. Procedure Code(s): --- Professional ---                    [phone], Colonoscopy, flexible; with biopsy, single or                     multiple Diagnosis Code(s): --- Professional ---                    K64.4, Residual hemorrhoidal skin tags                    D12.5, Benign neoplasm of sigmoid colon                    K62.5, Hemorrhage of anus and rectum                    K52.9, Noninfective gastroenteritis and colitis,                     unspecified                    K57.30, Diverticulosis of large intestine without                     perforation  or abscess without bleeding CPT copyright 2014 American Medical Association. All rights reserved. The codes documented in this report are preliminary and upon coder review may  be revised to meet current compliance requirements. Hulen Luster, MD 04/29/2015 8:50:32 AM This report has been signed electronically. Number of Addenda: 0 Note Initiated On:  04/29/2015 7:51 AM Scope Withdrawal Time: 0 hours 12 minutes 2 seconds  Total Procedure Duration: 0 hours 21 minutes 32 seconds       [facility]

## 2015-05-02 LAB — SURGICAL PATHOLOGY

## 2015-05-02 NOTE — Anesthesia Postprocedure Evaluation (Signed)
  Anesthesia Post-op Note  Patient: [name] [name].  Procedure(s) Performed: Procedure(s): COLONOSCOPY WITH PROPOFOL (N/A) ESOPHAGOGASTRODUODENOSCOPY (EGD) WITH PROPOFOL (N/A)  Anesthesia type:General  Patient location: PACU  Post pain: Pain level controlled  Post assessment: Post-op Vital signs reviewed, Patient's Cardiovascular Status Stable, Respiratory Function Stable, Patent Airway and No signs of Nausea or vomiting  Post vital signs: Reviewed and stable  Last Vitals:  Filed Vitals:   04/29/15 0935  BP: 118/62  Pulse: 71  Temp:   Resp: 14    Level of consciousness: awake, alert  and patient cooperative  Complications: No apparent anesthesia complications

## 2015-05-03 ENCOUNTER — Encounter: Payer: Self-pay | Admitting: Gastroenterology

## 2015-10-25 ENCOUNTER — Encounter: Payer: Self-pay | Admitting: Pain Medicine

## 2015-10-25 ENCOUNTER — Ambulatory Visit: Payer: Medicare Other | Attending: Pain Medicine | Admitting: Pain Medicine

## 2015-10-25 VITALS — BP 139/69 | HR 62 | Temp 97.6°F | Resp 18 | Ht 63.0 in | Wt 175.0 lb

## 2015-10-25 DIAGNOSIS — J449 Chronic obstructive pulmonary disease, unspecified: Secondary | ICD-10-CM | POA: Diagnosis not present

## 2015-10-25 DIAGNOSIS — E785 Hyperlipidemia, unspecified: Secondary | ICD-10-CM | POA: Diagnosis not present

## 2015-10-25 DIAGNOSIS — M109 Gout, unspecified: Secondary | ICD-10-CM | POA: Diagnosis not present

## 2015-10-25 DIAGNOSIS — Z0189 Encounter for other specified special examinations: Secondary | ICD-10-CM | POA: Insufficient documentation

## 2015-10-25 DIAGNOSIS — M25511 Pain in right shoulder: Secondary | ICD-10-CM | POA: Insufficient documentation

## 2015-10-25 DIAGNOSIS — R208 Other disturbances of skin sensation: Secondary | ICD-10-CM

## 2015-10-25 DIAGNOSIS — I252 Old myocardial infarction: Secondary | ICD-10-CM | POA: Insufficient documentation

## 2015-10-25 DIAGNOSIS — K219 Gastro-esophageal reflux disease without esophagitis: Secondary | ICD-10-CM | POA: Insufficient documentation

## 2015-10-25 DIAGNOSIS — F1190 Opioid use, unspecified, uncomplicated: Secondary | ICD-10-CM | POA: Insufficient documentation

## 2015-10-25 DIAGNOSIS — Z87891 Personal history of nicotine dependence: Secondary | ICD-10-CM | POA: Diagnosis not present

## 2015-10-25 DIAGNOSIS — M79605 Pain in left leg: Secondary | ICD-10-CM

## 2015-10-25 DIAGNOSIS — M79601 Pain in right arm: Secondary | ICD-10-CM

## 2015-10-25 DIAGNOSIS — I10 Essential (primary) hypertension: Secondary | ICD-10-CM | POA: Diagnosis not present

## 2015-10-25 DIAGNOSIS — M79604 Pain in right leg: Secondary | ICD-10-CM

## 2015-10-25 DIAGNOSIS — I429 Cardiomyopathy, unspecified: Secondary | ICD-10-CM | POA: Insufficient documentation

## 2015-10-25 DIAGNOSIS — E559 Vitamin D deficiency, unspecified: Secondary | ICD-10-CM | POA: Diagnosis not present

## 2015-10-25 DIAGNOSIS — I2510 Atherosclerotic heart disease of native coronary artery without angina pectoris: Secondary | ICD-10-CM | POA: Diagnosis not present

## 2015-10-25 DIAGNOSIS — F1021 Alcohol dependence, in remission: Secondary | ICD-10-CM

## 2015-10-25 DIAGNOSIS — M792 Neuralgia and neuritis, unspecified: Secondary | ICD-10-CM | POA: Insufficient documentation

## 2015-10-25 DIAGNOSIS — Z951 Presence of aortocoronary bypass graft: Secondary | ICD-10-CM | POA: Diagnosis not present

## 2015-10-25 DIAGNOSIS — R7303 Prediabetes: Secondary | ICD-10-CM | POA: Insufficient documentation

## 2015-10-25 DIAGNOSIS — M545 Low back pain: Secondary | ICD-10-CM | POA: Diagnosis not present

## 2015-10-25 DIAGNOSIS — L7682 Other postprocedural complications of skin and subcutaneous tissue: Secondary | ICD-10-CM | POA: Insufficient documentation

## 2015-10-25 DIAGNOSIS — G8929 Other chronic pain: Secondary | ICD-10-CM | POA: Diagnosis not present

## 2015-10-25 DIAGNOSIS — Z5181 Encounter for therapeutic drug level monitoring: Secondary | ICD-10-CM | POA: Insufficient documentation

## 2015-10-25 DIAGNOSIS — G4730 Sleep apnea, unspecified: Secondary | ICD-10-CM | POA: Diagnosis not present

## 2015-10-25 DIAGNOSIS — M79606 Pain in leg, unspecified: Secondary | ICD-10-CM | POA: Diagnosis present

## 2015-10-25 DIAGNOSIS — M25552 Pain in left hip: Secondary | ICD-10-CM | POA: Diagnosis not present

## 2015-10-25 DIAGNOSIS — M533 Sacrococcygeal disorders, not elsewhere classified: Secondary | ICD-10-CM

## 2015-10-25 DIAGNOSIS — R079 Chest pain, unspecified: Secondary | ICD-10-CM | POA: Diagnosis present

## 2015-10-25 DIAGNOSIS — R0789 Other chest pain: Secondary | ICD-10-CM

## 2015-10-25 NOTE — Progress Notes (Signed)
Patient's Name: [name] [name]. MRN: [id number] DOB: 29-Apr-1956 DOS: 10/25/2015  Primary Reason(s) for Visit: Initial Patient Evaluation CC: Chest Pain and Leg Pain   HPI  Mr. Stasi is a 60 y.o. year old, male patient, who comes today for an initial evaluation. He has Apnea, sleep; H/O coronary artery bypass surgery; Obstructive apnea; BP (high blood pressure); HLD (hyperlipidemia); Arteriosclerosis of coronary artery; Chronic obstructive pulmonary disease ([place]); Alcohol withdrawal syndrome ([place]); Injury of kidney; Chronic low back pain; Chronic shoulder pain (Location of Tertiary source of pain) (Right); Chronic hip pain (Left); Chronic pain; History of alcoholism ([place]); Avitaminosis D; Opiate use; Encounter for therapeutic drug level monitoring; Chronic chest wall pain (Location of Primary Source of Pain) (Incisional Midline) (since 04/22/2012); Chronic lower extremity pain (Location of Secondary source of pain) (Bilateral) (R>L); History of MI (myocardial infarction) (January 2014); Encounter for pain management planning; Chronic sacroiliac joint pain (Bilateral) (L>R); Neuropathic pain; Neurogenic pain; and Incisional pain on his problem list.. His primarily concern today is the Chest Pain and Leg Pain    The patient comes in today clinics today for the first time for evaluation of his chronic chest pain. According to the patient his primary pain is in the area of the chest around the incision from his CABG. This pain started with the surgery on 04/22/2012. 5 months after that he had a myocardial infarction. His secondary pain is described to be the lower extremities with the right leg being worst on the left. He describes to the pain in the right leg is right over where they harvested his vein and it also started with the surgery on 04/22/2012. His third worst pain is the right shoulder.  He indicates that he does not take any type of medications for this pain except for Tylenol. Review of his  [place] PMP reveals that he has taken tramadol and hydrocodone.  Reported Pain Score: 7 , clinically he looks like a 2-3/10. Reported level is inconsistent with clinical obrservations. Pain Type: Chronic pain Pain Location: Chest (leg) Pain Descriptors / Indicators: Pins and needles, Sharp (sensitive) Pain Frequency: Constant  Onset and Duration: Sudden, Date of onset: 04/22/2012 and Present longer than 3 months. Specifically he has had this pain for 3-1/2 years. Cause of pain: Surgery Severity: No change since onset, NAS-11 at its worse: 8/10, NAS-11 at its best: 6/10, NAS-11 now: 7/10 and NAS-11 on the average: 7/10 Timing: Not influenced by the time of the day, During activity or exercise, After activity or exercise and After a period of immobility Aggravating Factors: Prolonged sitting, Prolonged standing and Walking Alleviating Factors: Lying down, Medications, Resting, Sitting and Sleeping Associated Problems: Night-time cramps, Numbness, Pain that wakes patient up and Pain that does not allow patient to sleep Quality of Pain: Aching and Uncomfortable Previous Examinations or Tests: CT scan, Endoscopy, MRI scan and X-rays Previous Treatments: Narcotic medications  Historic Controlled Substance Pharmacotherapy Review  Previously Prescribed Opioids:  Analgesic: The patient denies currently taking any type of opioids. Historical Background Evaluation: [place] PDMP: Five (5) year initial data search conducted. In this search, I was able to find that he has taken tramadol, Tylenol No. 3, and hydrocodone. Historical Hospital-associated UDS Results:   Lab Results  Component Value Date   THCU NEGATIVE 04/21/2012   PCPSCRNUR NEGATIVE 04/21/2012   MDMA NEGATIVE 04/21/2012   AMPHETMU NEGATIVE 04/21/2012   METHADONE NEGATIVE 04/21/2012   ETOH < 3 04/21/2012   UDS Results: No UDS available, at this time UDS  Interpretation: No UDS available, at this time Medication Assessment Form: Not  applicable. Initial evaluation. The patient has not received any medications from our practice Treatment compliance: Not applicable. Initial evaluation Risk Assessment: Substance Use Disorder (SUD) Risk Level: Pending results of Medical Psychology Evaluation for SUD Opioid Risk Tool (ORT) Score: Total Score: 0 Depression Scale Score:    Pharmacologic Plan: Pending ordered tests and/or consults  Neuromodulation Therapy Review  Type: No neuromodulatory devices implanted Side-effects or Adverse reactions: No device reported Effectiveness: No device reported  Allergies  Mr. Derenzo has No Known Allergies.  Meds  The patient has a current medication list which includes the following prescription(s): allopurinol, amlodipine, aspirin ec, atorvastatin, cholecalciferol, hydrochlorothiazide, losartan, metoprolol, and multiple vitamins. Requested Prescriptions    No prescriptions requested or ordered in this encounter    ROS  Cardiovascular History: Heart trouble, Daily Aspirin intake, Hypertension, Chest pain, Heart attack ( Date: 04/22/2012), Heart surgery and Heart catheterization Pulmonary or Respiratory History: Shortness of breath, Snoring  and Sleep apnea Neurological History: Negative for epilepsy, stroke, urinary or fecal inontinence, spina bifida or tethered cord syndrome Psychological-Psychiatric History: Negative for anxiety, depression, schizophrenia, bipolar disorders or suicidal ideations or attempts Gastrointestinal History: Reflux or heatburn. Dysphagia secondary to esophageal strictures. In addition he has had removal of Adenomatose colon polyps. Genitourinary History: Negative for nephrolithiasis, hematuria, renal failure or chronic kidney disease Hematological History: Negative for anticoagulant therapy, anemia, bruising or bleeding easily, hemophilia, sickle cell disease or trait, thrombocytopenia or coagulupathies Endocrine History: Pre-diabetes Rheumatologic History:  Negative for lupus, osteoarthritis, rheumatoid arthritis, myositis, polymyositis or fibromyagia Musculoskeletal History: Negative for myasthenia gravis, muscular dystrophy, multiple sclerosis or malignant hyperthermia Work History: Legally disabled  [place]  Medical:  Mr. Neels  has a past medical history of Diverticulitis; Hypertension; Gout; Sleep apnea; Hyperlipidemia; Cardiomyopathy, secondary ([place]); and Coronary artery disease. Family: family history includes Heart disease in his mother; Hypertension in his father. Surgical:  has past surgical history that includes Coronary angioplasty with stent; cardiac bypass; Cardiac surgery; Coronary artery bypass graft; Colonoscopy with propofol (N/A, 04/29/2015); and Esophagogastroduodenoscopy (egd) with propofol (N/A, 04/29/2015). Tobacco:  reports that he has quit smoking. He does not have any smokeless tobacco history on file. Alcohol:  reports that he drinks about 1.2 oz of alcohol per week. Drug:  reports that he does not use illicit drugs.  Physical Exam  Vitals:  Today's Vitals   10/25/15 1540 10/25/15 1543  BP:  139/69  Pulse: 62   Temp: 97.6 F (36.4 C)   Resp: 18   Height: 5\' 3"  (1.6 m)   Weight: 175 lb (79.379 kg)   SpO2: 100%   PainSc: 7  7   PainLoc: Chest     Calculated BMI: Body mass index is 31.01 kg/(m^2).  General appearance: alert, cooperative, appears stated age, no distress and moderately obese Eyes: PERLA Respiratory: No evidence respiratory distress, no audible rales or ronchi and no use of accessory muscles of respiration  Cervical Spine Inspection: Normal anatomy Alignment: Symetrical ROM: Adequate  Upper Extremities Inspection: No gross anomalies detected ROM: Decreased in the area of the right shoulder. 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  Lumbar Spine Inspection: No gross anomalies detected Alignment: Symetrical ROM: Decreased Palpation: Tender Provocative Tests: Lumbar Hyperextension and rotation test: Positive bilaterally  Patrick's Maneuver: Positive for bilateral sacroiliac joint pain and left hip joint pain Gait: WNL  Lower Extremities Inspection: No gross anomalies detected ROM: Decreased hip and knee, bilaterally Sensory: Normal Motor: Unremarkable  Toe walk (S1): WNL  Heal walk (L5): WNL  Assessment  Primary Diagnosis & Pertinent Problem List: The primary encounter diagnosis was Chronic pain. Diagnoses of Chronic low back pain, Chronic right shoulder pain, Chronic left hip pain, History of alcoholism ([place]), Avitaminosis D, Opiate use, Encounter for therapeutic drug level monitoring, Chronic chest wall pain (Location of Primary Source of Pain) (Incisional Midline) (since 04/22/2012), Chronic pain of lower extremity, right, History of MI (myocardial infarction) (January 2014), Encounter for pain management planning, Chronic sacroiliac joint pain (Bilateral) (L>R), Neuropathic pain, Neurogenic pain, and Incisional pain were also pertinent to this visit.  Visit Diagnosis: 1. Chronic pain   2. Chronic low back pain   3. Chronic right shoulder pain   4. Chronic left hip pain   5. History of alcoholism ([place])   6. Avitaminosis D   7. Opiate use   8. Encounter for therapeutic drug level monitoring   9. Chronic chest wall pain (Location of Primary Source of Pain) (Incisional Midline) (since 04/22/2012)   10. Chronic pain of lower extremity, right   11. History of MI (myocardial infarction) (January 2014)   12. Encounter for pain management planning   13. Chronic sacroiliac joint pain (Bilateral) (L>R)   14. Neuropathic pain   15. Neurogenic pain   16. Incisional pain     Assessment: No problem-specific assessment & plan notes  found for this encounter.   Plan of Care  Note: As per protocol, today's visit has been an evaluation only. We have not taken over the patient's controlled substance management.  Pharmacotherapy (Medications Ordered): No orders of the defined types were placed in this encounter.    Lab-work & Procedure Ordered: Orders Placed This Encounter  Procedures  . ToxASSURE Select 13 (MW), Urine    Volume: 30 ml(s). Minimum 3 ml of urine is needed. Document temperature of fresh sample. Indications: Long term (current) use of opiate analgesic (Z79.891)  . Vitamin B1    Standing Status: Future     Number of Occurrences:      Standing Expiration Date: 10/24/2016  . Comprehensive metabolic panel    Standing Status: Future     Number of Occurrences:      Standing Expiration Date: 10/24/2016    Order Specific Question:  Has the patient fasted?    Answer:  No  . C-reactive protein    Standing Status: Future     Number of Occurrences:      Standing Expiration Date: 10/24/2016  . Magnesium    Standing Status: Future     Number of Occurrences:      Standing Expiration Date: 10/24/2016  . Sedimentation rate    Standing Status: Future     Number of Occurrences:      Standing Expiration Date: 10/24/2016  . Vitamin B12    Indication: Bone Pain (M89.9)    Standing Status: Future     Number of Occurrences:      Standing Expiration Date: 10/24/2016  . Vitamin D pnl(25-hydrxy+1,25-dihy)-bld    Standing Status: Future     Number of Occurrences:      Standing Expiration Date: 10/24/2016  . Ambulatory referral to Psychology    Referral Priority:  Routine    Referral Type:  Psychiatric    Referral Reason:  Specialty Services Required  Referred to Provider:  Beckey Rutter, PHD    Requested Specialty:  Psychology    Number of Visits Requested:  1    Imaging Ordered: AMB REFERRAL TO PSYCHOLOGY  Interventional Therapies: Scheduled: None at this time. PRN Procedures: None at this time.     Referral(s) or Consult(s): Medical psychology evaluation for substance use disorder.  Medications administered during this visit: Mr. Bilderback had no medications administered during this visit.  No future appointments.  Primary Care Physician: Elisabeth Cara, NP Location: [facility] Note by: Kathlen Brunswick. Dossie Arbour, M.D, DABA, DABAPM, DABPM, DABIPP, FIPP

## 2015-10-25 NOTE — Progress Notes (Signed)
Safety precautions to be maintained throughout the outpatient stay will include: orient to surroundings, keep bed in low position, maintain call bell within reach at all times, provide assistance with transfer out of bed and ambulation.  

## 2015-10-25 NOTE — Patient Instructions (Signed)
Instructed to get labwork drawn in the medical mall.  Informed patient that the order would expire in 2 weeks.

## 2015-10-26 ENCOUNTER — Other Ambulatory Visit
Admission: RE | Admit: 2015-10-26 | Discharge: 2015-10-26 | Disposition: A | Discharge status: Home / Self Care | Payer: Medicare Other | Source: Ambulatory Visit | Attending: Pain Medicine | Admitting: Pain Medicine

## 2015-10-26 DIAGNOSIS — G8929 Other chronic pain: Secondary | ICD-10-CM | POA: Diagnosis present

## 2015-10-26 DIAGNOSIS — E559 Vitamin D deficiency, unspecified: Secondary | ICD-10-CM | POA: Diagnosis present

## 2015-10-26 DIAGNOSIS — F1021 Alcohol dependence, in remission: Secondary | ICD-10-CM

## 2015-10-26 DIAGNOSIS — M899 Disorder of bone, unspecified: Secondary | ICD-10-CM | POA: Diagnosis present

## 2015-10-26 LAB — COMPREHENSIVE METABOLIC PANEL
ALBUMIN: 4.4 g/dL (ref 3.5–5.0)
ALK PHOS: 87 U/L (ref 38–126)
ALT: 75 U/L — AB (ref 17–63)
AST: 66 U/L — AB (ref 15–41)
Anion gap: 9 (ref 5–15)
BUN: 17 mg/dL (ref 6–20)
CALCIUM: 9.1 mg/dL (ref 8.9–10.3)
CHLORIDE: 100 mmol/L — AB (ref 101–111)
CO2: 28 mmol/L (ref 22–32)
CREATININE: 1.27 mg/dL — AB (ref 0.61–1.24)
GFR calc Af Amer: >60 mL/min (ref >60)
GFR calc non Af Amer: 59 mL/min — ABNORMAL LOW (ref >60)
GLUCOSE: 145 mg/dL — AB (ref 65–99)
Potassium: 3.5 mmol/L (ref 3.5–5.1)
SODIUM: 137 mmol/L (ref 135–145)
Total Bilirubin: 1.2 mg/dL (ref 0.3–1.2)
Total Protein: 8 g/dL (ref 6.5–8.1)

## 2015-10-26 LAB — C-REACTIVE PROTEIN: CRP: <0.5 mg/dL (ref <1.0)

## 2015-10-26 LAB — MAGNESIUM: MAGNESIUM: 2 mg/dL (ref 1.7–2.4)

## 2015-10-26 LAB — SEDIMENTATION RATE: Sed Rate: 32 mm/hr — ABNORMAL HIGH (ref 0–20)

## 2015-10-28 LAB — VITAMIN D PNL(25-HYDRXY+1,25-DIHY)-BLD
VIT D 1 25 DIHYDROXY: 30.8 pg/mL (ref 19.9–79.3)
VIT D 25 HYDROXY: 26.5 ng/mL — AB (ref 30.0–100.0)

## 2015-10-28 LAB — VITAMIN B1: Vitamin B1 (Thiamine): 170.3 nmol/L (ref 66.5–200.0)

## 2015-11-01 ENCOUNTER — Other Ambulatory Visit: Payer: Self-pay | Admitting: Pain Medicine

## 2015-11-01 ENCOUNTER — Encounter: Payer: Self-pay | Admitting: Pain Medicine

## 2015-11-01 DIAGNOSIS — E559 Vitamin D deficiency, unspecified: Secondary | ICD-10-CM

## 2015-11-01 DIAGNOSIS — R700 Elevated erythrocyte sedimentation rate: Secondary | ICD-10-CM | POA: Insufficient documentation

## 2015-11-01 NOTE — Progress Notes (Signed)

## 2015-11-01 NOTE — Progress Notes (Signed)
Quick Note:  Lab results reviewed and found to be within normal limits. ______ 

## 2015-11-01 NOTE — Progress Notes (Signed)
Quick Note:   Normal chloride levels are between 95 and 107 mEq/L. Low levels may be due to: Addison disease; Bartter syndrome; burns; congestive heart failure; dehydration; excessive sweating; hyperaldosteronism; metabolic alkalosis; respiratory acidosis (compensated); Syndrome of inappropriate diuretic hormone secretion (SIADH); or vomiting.  Normal fasting (NPO x 8 hours) glucose levels are between 65-99 mg/dl, with 2 hour fasting, levels are usually less than 140 mg/dl. Any random blood glucose level greater than 200 mg/dl is considered to be Diabetes.  Normal Creatinine levels are between 0.5 and 0.9 mg/dl for our lab. Any condition that impairs the function of the kidneys is likely to raise the creatinine level in the blood. The most common causes of longstanding (chronic) kidney disease in adults are high blood pressure and diabetes. Other causes of elevated blood creatinine levels include drugs, ingestion of a large amount of dietary meat, kidney infections, rhabdomyolysis (abnormal muscle breakdown), and urinary tract obstruction.  Normal levels of AST are between 5 and 40 U/L. Pregnancy, a muscle injection, or even strenuous exercise may increase AST levels. Acute burns, surgery, and seizures may raise AST levels as well. Very high levels of AST (> 10 X normal) are usually due to acute hepatitis. Levels > 100 X normal can be seen with liver exposure to hepatotoxic substances. Moderate increases may be seen in other diseases of the liver, especially when the bile ducts are blocked, or with cirrhosis or certain cancers of the liver. AST may also increase after heart attacks and with muscle injury, usually to a much greater degree than ALT. In most types of liver disease, the ALT level is higher than AST and the AST/ALT ratio will be low (less than 1). With heart or muscle injury, AST is often much higher than ALT (often 3-5 times as high) and levels tend to stay higher than ALT for longer than with  liver injury.  The normal range for ALT (SGPT) values is about 7 to 56 units per liter. Muscle injections and/or strenuous exercise, may increase alanine aminotransferase (ALT) levels. Many drugs may raise levels by causing liver damage. Other causes of moderate increases include bile duct obstruction, cirrhosis, heart damage, alcohol abuse, and liver tumors. In most types of liver diseases, the ALT level is higher than AST, leading to a low AST/ALT ratio( >1). Exceptions include alcoholic or acute hepatitis, cirrhosis, as well as heart and/or muscle injury. Levels (>10 X normal) may be seen with acute hepatitis while results (sometimes >100 X normal) may indicate liver exposure to toxic substances.  AST and ALT are considered to be two of the most important tests to detect liver injury, although ALT is more specific for the liver than is AST and is more commonly increased than is AST. Sometimes AST is compared directly to ALT and an AST/ALT ratio is calculated. This ratio may be used to distinguish between different causes of liver damage and to distinguish liver injury from damage to heart or muscle  In most types of liver disease, the ALT level is higher than AST (AST/ALT ratio < 1). There are a few exceptions; the AST/ALT ratio is usually increased in alcoholic hepatitis, cirrhosis, hepatitis C virus-related chronic liver disease, and in the first day or two of acute hepatitis or injury from bile duct obstruction. With heart or muscle injury, AST is often much higher than ALT (often 3-5 times as high) and levels tend to stay higher than ALT for longer than with liver injury.  When the AST is higher than  ALT (AST/ALT Ratio > 1), a muscle source of these enzymes should be considered. For example, muscle inflammation due to dermatomyositis may cause AST>ALT. This is a good reminder that AST and ALT are not good measures of liver function because they do not reliably reflect the synthetic ability of the  liver and they may come from tissues other than liver (such as muscle) eGFR (Estimated Glomerular Filtration Rate) results are reported as milliliters/minute/1.8m (mL/min/1.71m. Because some laboratories do not collect information on a patient's race when the sample is collected for testing, they may report calculated results for both African Americans and non-African Americans.  The NaNationwide Mutual InsuranceNOw[facility] Ltdsuggests only reporting actual results once values are < 60 mL/min. 1. Normal values: 90-120 mL/min 2. Below 60 mL/min suggests that some kidney damage has occurred. 3. Between 5968nd 30 indicate (Moderate) Stage 3 kidney disease. 4. Between 29 and 15 represent (Severe) Stage 4 kidney disease. 5. Less than 15 is considered (Kidney Failure) Stage 5. ______

## 2015-11-01 NOTE — Progress Notes (Signed)
Quick Note:  Normal levels of Vitamin D for our Lab are between 30 and 100 ng/mL. The results of this test indicate that this patient has low levels of Vitamin D. A vitamin D level below 20 ng/ml, is diagnosed as a "Vitamin D Deficiency". Levels between 20-30 ng/ml are defined as a "Vitamin D insufficiency". Common causes include: dietary insufficiency; inadequate sun exposure; inability to absorb vitamin D from the intestines; or inability to process it due to kidney or liver disease. Associated complications may include hypocalcemia, hypophosphatemia, and reduced bone density. Vitamin D deficiencies and insufficiencies may be associated with fatigue, weakness, bone pain, joint pain, and muscle pain. Patient may benefit from taking over-the-counter Vitamin D3 supplements. I recommend a vitamin D + Calcium supplements. "Natures Bounty", a brand easily found in most pharmacies, has a formulation containing Calcium 1200 mg plus Vitamin D3 1000 IU, in Softgels capsules that are easy to swallow. This should be taken once a day, preferably in the morning as vitamin D will increase energy levels and make it difficult to fall asleep, if taken at night. Patients with levels lower than 20 ng/ml should contact their primary care physicians to receive replacement therapy. Vitamin D3 can be obtained over-the-counter, without a prescription. Vitamin D2 requires a prescription and it is used for replacement therapy.  ______ 

## 2015-11-03 LAB — SPECIMEN STATUS REPORT

## 2015-11-03 LAB — TOXASSURE SELECT 13 (MW), URINE: PDF: 0

## 2015-11-21 ENCOUNTER — Ambulatory Visit
Admission: RE | Admit: 2015-11-21 | Discharge: 2015-11-21 | Disposition: A | Discharge status: Home / Self Care | Payer: Medicare Other | Source: Ambulatory Visit | Attending: Pain Medicine | Admitting: Pain Medicine

## 2015-11-21 ENCOUNTER — Encounter: Payer: Self-pay | Admitting: Pain Medicine

## 2015-11-21 ENCOUNTER — Ambulatory Visit (HOSPITAL_BASED_OUTPATIENT_CLINIC_OR_DEPARTMENT_OTHER): Payer: Medicare Other | Admitting: Pain Medicine

## 2015-11-21 VITALS — BP 150/79 | HR 70 | Temp 97.8°F | Resp 16 | Wt 175.0 lb

## 2015-11-21 DIAGNOSIS — M792 Neuralgia and neuritis, unspecified: Secondary | ICD-10-CM | POA: Diagnosis not present

## 2015-11-21 DIAGNOSIS — G8929 Other chronic pain: Secondary | ICD-10-CM | POA: Diagnosis not present

## 2015-11-21 DIAGNOSIS — M545 Low back pain, unspecified: Secondary | ICD-10-CM

## 2015-11-21 DIAGNOSIS — M5116 Intervertebral disc disorders with radiculopathy, lumbar region: Secondary | ICD-10-CM | POA: Insufficient documentation

## 2015-11-21 DIAGNOSIS — R0789 Other chest pain: Secondary | ICD-10-CM

## 2015-11-21 DIAGNOSIS — M5117 Intervertebral disc disorders with radiculopathy, lumbosacral region: Secondary | ICD-10-CM | POA: Insufficient documentation

## 2015-11-21 DIAGNOSIS — M47816 Spondylosis without myelopathy or radiculopathy, lumbar region: Secondary | ICD-10-CM | POA: Insufficient documentation

## 2015-11-21 DIAGNOSIS — Z5181 Encounter for therapeutic drug level monitoring: Secondary | ICD-10-CM

## 2015-11-21 DIAGNOSIS — M533 Sacrococcygeal disorders, not elsewhere classified: Secondary | ICD-10-CM

## 2015-11-21 DIAGNOSIS — M25552 Pain in left hip: Secondary | ICD-10-CM

## 2015-11-21 DIAGNOSIS — E559 Vitamin D deficiency, unspecified: Secondary | ICD-10-CM

## 2015-11-21 DIAGNOSIS — F1190 Opioid use, unspecified, uncomplicated: Secondary | ICD-10-CM | POA: Diagnosis not present

## 2015-11-21 DIAGNOSIS — I700 Atherosclerosis of aorta: Secondary | ICD-10-CM | POA: Diagnosis not present

## 2015-11-21 MED ORDER — VITAMIN D (ERGOCALCIFEROL) 1.25 MG (50000 UNIT) PO CAPS: ORAL_CAPSULE | ORAL | Status: DC

## 2015-11-21 MED ORDER — GABAPENTIN 300 MG PO CAPS: 300.0000 mg | ORAL_CAPSULE | Freq: Every day | ORAL | Status: DC

## 2015-11-21 MED ORDER — MELOXICAM 15 MG PO TABS: 15.0000 mg | ORAL_TABLET | Freq: Every day | ORAL | Status: DC

## 2015-11-21 MED ORDER — VITAMIN D3 50 MCG (2000 UT) PO CAPS: ORAL_CAPSULE | ORAL | Status: DC

## 2015-11-21 NOTE — Progress Notes (Signed)
Patient's Name: Sayid Morenz. MRN: [id number] DOB: [birth year] DOS: 11/21/2015  Primary Reason(s) for Visit: Encounter for Medication Management CC: Leg Pain and Chest Pain   HPI  Mr. Stanbro is a 60 y.o. year old, male patient, who returns today as an established patient. He has Apnea, sleep; H/O coronary artery bypass surgery; Obstructive apnea; BP (high blood pressure); HLD (hyperlipidemia); Arteriosclerosis of coronary artery; Chronic obstructive pulmonary disease ([place]); Alcohol withdrawal syndrome ([place]); Injury of kidney; Chronic low back pain (Bilateral) (R>L); Chronic shoulder pain (Location of Tertiary source of pain) (Right); Chronic hip pain (Left); Chronic pain; History of alcoholism ([place]); Opiate use; Encounter for therapeutic drug level monitoring; Chronic chest wall pain (Location of Primary Source of Pain) (Incisional Midline) (since 04/22/2012); Chronic lower extremity pain (Location of Secondary source of pain) (Bilateral) (R>L); History of MI (myocardial infarction) (January 2014); Encounter for pain management planning; Chronic sacroiliac joint pain (Bilateral) (L>R); Neuropathic pain; Neurogenic pain; Incisional pain; Elevated sedimentation rate; Vitamin D insufficiency; Lumbar facet syndrome (Bilateral) (R>L); and Lumbar spondylosis on his problem list.. His primarily concern today is the Leg Pain and Chest Pain   The patient returns to the clinics today for follow-up evaluation. Today he indicates that his worst pain is the incisional pain over his chest area. This is followed by the right lower extremity pain and then the low back pain. During today's evaluation it was clear that he has a bilateral lumbar facet syndrome as demonstrated by the hyperextension and rotation maneuver of the lumbar spine that was positive for both sides with reproduction of the low back and leg pain. In addition, he has problems with his left hip and both SI joints as shown by a positive pain on  Patrick's maneuver, bilaterally. Today we will be ordering some x-rays of the areas and will plan on bringing him back for a diagnostic lumbar facet block.  Although he had indicated that he had tried gabapentin, as it turns out he only tried 300 mg and quit. Today we have explained to him that he needs to reveal trial of this medication we 3-week-old trying some Lyrica or an opioid.  Pain Assessment: Self-Reported Pain Score: 6  Reported level is compatible with observation Pain Type: Chronic pain Pain Location: Leg Pain Orientation: Right Pain Descriptors / Indicators: Aching, Constant, Operative site guarding, Radiating (pain in the chest is a constant pain 7/10; pain from surgical incision from heart bypass surgery in Aug 2013) Pain Frequency: Constant  Date of Last Visit: 10/25/15 Service Provided on Last Visit: Evaluation  Controlled Substance Pharmacotherapy Assessment  Analgesic: No opioids at this point. MME/day: No opioids at this point. Pharmacokinetics: Onset of action (Liberation/Absorption): Within expected pharmacological parameters Time to Peak effect (Distribution): Timing and results are as within normal expected parameters Duration of action (Metabolism/Excretion): Within normal limits for medication Pharmacodynamics: Analgesic Effect: More than 50% Activity Facilitation: Medication(s) allow patient to sit, stand, walk, and do the basic ADLs Perceived Effectiveness: Described as relatively effective, allowing for increase in activities of daily living (ADL) Side-effects or Adverse reactions: None reported Monitoring: [place] PMP: Compliant with practice rules and regulations UDS Results/interpretation: The patient's last UDS was on 10/25/2015 and it came back within normal limits with no opioids. Medication Assessment Form: Reviewed. Patient indicates being compliant with therapy Treatment compliance: Compliant Risk Assessment: Aberrant Behavior: None observed  today Substance Use Disorder (SUD) Risk Level: Low Opioid Risk Tool (ORT) Score: Total Score: 3 Low Risk for SUD (Score <3) Depression Scale  Score: PHQ-2: PHQ-2 Total Score: 0 No depression (0) PHQ-9: PHQ-9 Total Score: 0 No depression (0-4)  Pharmacologic Plan: No change in therapy, at this time   Laboratory Workup  Last ED UDS: Lab Results  Component Value Date   THCU NEGATIVE 04/21/2012   PCPSCRNUR NEGATIVE 04/21/2012   MDMA NEGATIVE 04/21/2012   AMPHETMU NEGATIVE 04/21/2012   METHADONE NEGATIVE 04/21/2012   ETOH < 3 04/21/2012    Inflammation Markers Lab Results  Component Value Date   ESRSEDRATE 32* 10/26/2015   CRP <0.5 10/26/2015    Renal Function Lab Results  Component Value Date   BUN 17 10/26/2015   CREATININE 1.27* 10/26/2015   GFRAA >60 10/26/2015   GFRNONAA 59* 10/26/2015    Hepatic Function Lab Results  Component Value Date   AST 66* 10/26/2015   ALT 75* 10/26/2015   ALBUMIN 4.4 10/26/2015    Electrolytes Lab Results  Component Value Date   NA 137 10/26/2015   K 3.5 10/26/2015   CL 100* 10/26/2015   CALCIUM 9.1 10/26/2015   MG 2.0 10/26/2015    Allergies  Mr. Cicale has No Known Allergies.  Meds  The patient has a current medication list which includes the following prescription(s): allopurinol, amlodipine, aspirin ec, atorvastatin, cholecalciferol, hydrochlorothiazide, losartan, metoprolol, multiple vitamins, vitamin d3, gabapentin, meloxicam, and vitamin d (ergocalciferol).  Current Outpatient Prescriptions on File Prior to Visit  Medication Sig  . allopurinol (ZYLOPRIM) 300 MG tablet Take 300 mg by mouth daily.  Marland Kitchen amLODipine (NORVASC) 10 MG tablet Take 10 mg by mouth daily.  Marland Kitchen aspirin EC 81 MG tablet Take 81 mg by mouth daily.  Marland Kitchen atorvastatin (LIPITOR) 40 MG tablet Take 40 mg by mouth daily at 6 PM.   . Cholecalciferol 1000 UNITS capsule Take 1,000 Units by mouth daily.  . hydrochlorothiazide (HYDRODIURIL) 25 MG tablet Take 25 mg  by mouth daily.  Marland Kitchen losartan (COZAAR) 25 MG tablet Take 25 mg by mouth daily.  . metoprolol (LOPRESSOR) 50 MG tablet Take 50 mg by mouth 3 (three) times daily.  . Multiple Vitamins tablet Take 1 tablet by mouth daily.    No current facility-administered medications on file prior to visit.    ROS  Constitutional: Afebrile, no chills, well hydrated and well nourished Gastrointestinal: negative Musculoskeletal:negative Neurological: negative Behavioral/Psych: negative  PFSH  Medical:  Mr. Amirian  has a past medical history of Diverticulitis; Hypertension; Gout; Sleep apnea; Hyperlipidemia; Cardiomyopathy, secondary ([place]); and Coronary artery disease. Family: family history includes Heart disease in his mother; Hypertension in his father. Surgical:  has past surgical history that includes Coronary angioplasty with stent; cardiac bypass; Cardiac surgery; Coronary artery bypass graft; Colonoscopy with propofol (N/A, 04/29/2015); and Esophagogastroduodenoscopy (egd) with propofol (N/A, 04/29/2015). Tobacco:  reports that he has quit smoking. He does not have any smokeless tobacco history on file. Alcohol:  reports that he drinks about 1.2 oz of alcohol per week. Drug:  reports that he does not use illicit drugs.  Physical Exam  Vitals:  Today's Vitals   11/21/15 1010 11/21/15 1012  BP: 150/79   Pulse: 70   Temp: 97.8 F (36.6 C)   TempSrc: Oral   Resp: 16   Weight: 175 lb (79.379 kg)   SpO2: 96%   PainSc: 6  6   PainLoc: Leg     Calculated BMI: Body mass index is 31.01 kg/(m^2). Obese (Class I) (30-34.9 kg/m2) - 68% higher incidence of chronic pain  General appearance: alert, cooperative, appears stated age and  no distress Eyes: PERLA Respiratory: No evidence respiratory distress, no audible rales or ronchi and no use of accessory muscles of respiration  Cervical Spine Inspection: Normal anatomy Alignment: Symetrical ROM: Adequate  Upper Extremities Inspection: No gross  anomalies detected ROM: Adequate Sensory: Normal Motor: Unremarkable  Thoracic Spine Inspection: No gross anomalies detected Alignment: Symetrical ROM: Adequate  Lumbar Spine Inspection: No gross anomalies detected Alignment: Symetrical ROM: Adequate  Gait: WNL  Lower Extremities Inspection: No gross anomalies detected ROM: Adequate Sensory:  Normal Motor: Unremarkable  Assessment & Plan  Primary Diagnosis & Pertinent Problem List: The primary encounter diagnosis was Chronic pain. Diagnoses of Encounter for therapeutic drug level monitoring, Opiate use, Neurogenic pain, Chronic chest wall pain (Location of Primary Source of Pain) (Incisional Midline) (since 04/22/2012), Vitamin D insufficiency, Chronic low back pain (Bilateral) (R>L), Lumbar facet syndrome (Bilateral) (R>L), Lumbar spondylosis, unspecified spinal osteoarthritis, Chronic sacroiliac joint pain (Bilateral) (L>R), and Chronic hip pain (Left) were also pertinent to this visit.  Visit Diagnosis: 1. Chronic pain   2. Encounter for therapeutic drug level monitoring   3. Opiate use   4. Neurogenic pain   5. Chronic chest wall pain (Location of Primary Source of Pain) (Incisional Midline) (since 04/22/2012)   6. Vitamin D insufficiency   7. Chronic low back pain (Bilateral) (R>L)   8. Lumbar facet syndrome (Bilateral) (R>L)   9. Lumbar spondylosis, unspecified spinal osteoarthritis   10. Chronic sacroiliac joint pain (Bilateral) (L>R)   11. Chronic hip pain (Left)     Problem-specific Plan(s): No problem-specific assessment & plan notes found for this encounter.   Plan of Care  Pharmacotherapy (Medications Ordered): Meds ordered this encounter  Medications  . Vitamin D, Ergocalciferol, (DRISDOL) 50000 units CAPS capsule    Sig: Take 1 capsule (50,000 Units total) by mouth 2 (two) times a week. X 6 weeks.    Dispense:  12 capsule    Refill:  0    Do not place this medication, or any other prescription from  our practice, on "Automatic Refill".  . Cholecalciferol (VITAMIN D3) 2000 units capsule    Sig: Take 1 capsule (2,000 Units total) by mouth daily.    Dispense:  30 capsule    Refill:  PRN    Do not place this medication, or any other prescription from our practice, on "Automatic Refill".  . meloxicam (MOBIC) 15 MG tablet    Sig: Take 1 tablet (15 mg total) by mouth daily.    Dispense:  30 tablet    Refill:  0    Do not place this medication, or any other prescription from our practice, on "Automatic Refill". Patient may have prescription filled one day early if pharmacy is closed on scheduled refill date.  . gabapentin (NEURONTIN) 300 MG capsule    Sig: Take 1-3 capsules (300-900 mg total) by mouth at bedtime. Follow titration schedule.    Dispense:  90 capsule    Refill:  0    Lab-work & Procedure Ordered: Orders Placed This Encounter  Procedures  . DG Lumbar Spine Complete W/Bend    Standing Status: Future     Number of Occurrences: 1     Standing Expiration Date: 11/20/2016    Scheduling Instructions:     Please include flexion and extension views and report any spinal instability (>4 mm displacement of any spondylolisthesis). If present, please report any spondylolisthesis grade, as well as displacement in millimeters.    Order Specific Question:  Reason for Exam (SYMPTOM  OR DIAGNOSIS REQUIRED)    Answer:  Low back pain    Order Specific Question:  Preferred imaging location?    Answer:  [facility]    Order Specific Question:  Call Results- Best Contact Number?    Answer:  [phone] (Pain Clinic facility) (Dr. Dossie Arbour)  . DG HIP UNILAT W OR W/O PELVIS 2-3 VIEWS LEFT    Standing Status: Future     Number of Occurrences: 1     Standing Expiration Date: 11/20/2016    Order Specific Question:  Reason for Exam (SYMPTOM  OR DIAGNOSIS REQUIRED)    Answer:  Low back pain    Order Specific Question:  Preferred imaging location?    Answer:  [facility]    Order  Specific Question:  Call Results- Best Contact Number?    Answer:  [phone] (Pain Clinic facility) (Dr. Dossie Arbour)  . DG Si Joints    Standing Status: Future     Number of Occurrences: 1     Standing Expiration Date: 11/20/2016    Order Specific Question:  Reason for Exam (SYMPTOM  OR DIAGNOSIS REQUIRED)    Answer:  Low back pain    Order Specific Question:  Preferred imaging location?    Answer:  [facility]    Order Specific Question:  Call Results- Best Contact Number?    Answer:  [phone] (Pain Clinic facility) (Dr. Dossie Arbour)    Imaging Ordered: None  Interventional Therapies: Scheduled: Diagnostic bilateral lumbar facet block under fluoroscopic guidance and IV sedation. PRN Procedures:  1. Possible left intra-articular hip injection  2. Possible bilateral SI joint block under fluoroscopic guidance.    Referral(s) or Consult(s): None at this point.  Medications administered during this visit: Mr. Ro does not currently have medications on file.  Future Appointments Date Time Provider [place]  12/06/2015 9:00 AM Milinda Pointer, MD [facility] None    Primary Care Physician: Elisabeth Cara, NP Location: [facility] Note by: Kathlen Brunswick. Dossie Arbour, M.D, DABA, DABAPM, DABPM, DABIPP, FIPP  Pain Score Disclaimer: We use the NRS-11 scale. This is a self-reported, subjective measurement of pain severity with only modest accuracy. It is used primarily to identify changes within a particular patient. It must be understood that outpatient pain scales are significantly less accurate that those used for research, where they can be applied under ideal controlled circumstances with minimal exposure to variables. In reality, the score is likely to be a combination of pain intensity and pain affect, where pain affect describes the degree of emotional arousal or changes in action readiness caused by the sensory experience of pain. Factors  such as social and work situation, setting, emotional state, anxiety levels, expectation, and prior pain experience may influence pain perception and show large inter-individual differences that may also be affected by time variables.

## 2015-11-21 NOTE — Patient Instructions (Addendum)
Facet Blocks Patient Information  Description: The facets are joints in the spine between the vertebrae.  Like any joints in the body, facets can become irritated and painful.  Arthritis can also effect the facets.  By injecting steroids and local anesthetic in and around these joints, we can temporarily block the nerve supply to them.  Steroids act directly on irritated nerves and tissues to reduce selling and inflammation which often leads to decreased pain.  Facet blocks may be done anywhere along the spine from the neck to the low back depending upon the location of your pain.   After numbing the skin with local anesthetic (like Novocaine), a small needle is passed onto the facet joints under x-ray guidance.  You may experience a sensation of pressure while this is being done.  The entire block usually lasts about 15-25 minutes.   Conditions which may be treated by facet blocks:   Low back/buttock pain  Neck/shoulder pain  Certain types of headaches  Preparation for the injection:   Do not eat any solid food or dairy products within 8 hours of your appointment.  You may drink clear liquid up to 3 hours before appointment.  Clear liquids include water, black coffee, juice or soda.  No milk or cream please.  You may take your regular medication, including pain medications, with a sip of water before your appointment.  Diabetics should hold regular insulin (if taken separately) and take 1/2 normal NPH dose the morning of the procedure.  Carry some sugar containing items with you to your appointment.  A driver must accompany you and be prepared to drive you home after your procedure.  Bring all your current medications with you.  An IV may be inserted and sedation may be given at the discretion of the physician.  A blood pressure cuff, EKG and other monitors will often be applied during the procedure.  Some patients may need to have extra oxygen administered for a short period.  You  will be asked to provide medical information, including your allergies and medications, prior to the procedure.  We must know immediately if you are taking blood thinners (like Coumadin/Warfarin) or if you are allergic to IV iodine contrast (dye).  We must know if you could possible be pregnant.  Possible side-effects:   Bleeding from needle site  Infection (rare, may require surgery)  Nerve injury (rare)  Numbness & tingling (temporary)  Difficulty urinating (rare, temporary)  Spinal headache (a headache worse with upright posture)  Light-headedness (temporary)  Pain at injection site (serveral days)  Decreased blood pressure (rare, temporary)  Weakness in arm/leg (temporary)  Pressure sensation in back/neck (temporary)   Call if you experience:   Fever/chills associated with headache or increased back/neck pain  Headache worsened by an upright position  New onset, weakness or numbness of an extremity below the injection site  Hives or difficulty breathing (go to the emergency room)  Inflammation or drainage at the injection site(s)  Severe back/neck pain greater than usual  New symptoms which are concerning to you  Please note:  Although the local anesthetic injected can often make your back or neck feel good for several hours after the injection, the pain will likely return. It takes 3-7 days for steroids to work.  You may not notice any pain relief for at least one week.  If effective, we will often do a series of 2-3 injections spaced 3-6 weeks apart to maximally decrease your pain.  After the initial  series, you may be a candidate for a more permanent nerve block of the facets.  If you have any questions, please call [phone] [place] to get xrays doen in the medical mall today or asap.GENERAL RISKS AND COMPLICATIONS  What are the risk, side effects and possible complications? Generally speaking, most  procedures are safe.  However, with any procedure there are risks, side effects, and the possibility of complications.  The risks and complications are dependent upon the sites that are lesioned, or the type of nerve block to be performed.  The closer the procedure is to the spine, the more serious the risks are.  Great care is taken when placing the radio frequency needles, block needles or lesioning probes, but sometimes complications can occur. 1. Infection: Any time there is an injection through the skin, there is a risk of infection.  This is why sterile conditions are used for these blocks.  There are four possible types of infection. 1. Localized skin infection. 2. Central Nervous System Infection-This can be in the form of Meningitis, which can be deadly. 3. Epidural Infections-This can be in the form of an epidural abscess, which can cause pressure inside of the spine, causing compression of the spinal cord with subsequent paralysis. This would require an emergency surgery to decompress, and there are no guarantees that the patient would recover from the paralysis. 4. Discitis-This is an infection of the intervertebral discs.  It occurs in about 1% of discography procedures.  It is difficult to treat and it may lead to surgery.        2. Pain: the needles have to go through skin and soft tissues, will cause soreness.       3. Damage to internal structures:  The nerves to be lesioned may be near blood vessels or    other nerves which can be potentially damaged.       4. Bleeding: Bleeding is more common if the patient is taking blood thinners such as  aspirin, Coumadin, Ticiid, Plavix, etc., or if he/she have some genetic predisposition  such as hemophilia. Bleeding into the spinal canal can cause compression of the spinal  cord with subsequent paralysis.  This would require an emergency surgery to  decompress and there are no guarantees that the patient would recover from the  paralysis.        5. Pneumothorax:  Puncturing of a lung is a possibility, every time a needle is introduced in  the area of the chest or upper back.  Pneumothorax refers to free air around the  collapsed lung(s), inside of the thoracic cavity (chest cavity).  Another two possible  complications related to a similar event would include: Hemothorax and Chylothorax.   These are variations of the Pneumothorax, where instead of air around the collapsed  lung(s), you may have blood or chyle, respectively.       6. Spinal headaches: They may occur with any procedures in the area of the spine.       7. Persistent CSF (Cerebro-Spinal Fluid) leakage: This is a rare problem, but may occur  with prolonged intrathecal or epidural catheters either due to the formation of a fistulous  track or a dural tear.       8. Nerve damage: By working so close to the spinal cord, there is always a possibility of  nerve damage, which could be as serious as a permanent spinal cord injury with  paralysis.  9. Death:  Although rare, severe deadly allergic reactions known as "Anaphylactic  reaction" can occur to any of the medications used.      10. Worsening of the symptoms:  We can always make thing worse.  What are the chances of something like this happening? Chances of any of this occuring are extremely low.  By statistics, you have more of a chance of getting killed in a motor vehicle accident: while driving to the hospital than any of the above occurring .  Nevertheless, you should be aware that they are possibilities.  In general, it is similar to taking a shower.  Everybody knows that you can slip, hit your head and get killed.  Does that mean that you should not shower again?  Nevertheless always keep in mind that statistics do not mean anything if you happen to be on the wrong side of them.  Even if a procedure has a 1 (one) in a 1,000,000 (million) chance of going wrong, it you happen to be that one..Also, keep in mind that by statistics,  you have more of a chance of having something go wrong when taking medications.  Who should not have this procedure? If you are on a blood thinning medication (e.g. Coumadin, Plavix, see list of "Blood Thinners"), or if you have an active infection going on, you should not have the procedure.  If you are taking any blood thinners, please inform your physician.  How should I prepare for this procedure?  Do not eat or drink anything at least six hours prior to the procedure.  Bring a driver with you .  It cannot be a taxi.  Come accompanied by an adult that can drive you back, and that is strong enough to help you if your legs get weak or numb from the local anesthetic.  Take all of your medicines the morning of the procedure with just enough water to swallow them.  If you have diabetes, make sure that you are scheduled to have your procedure done first thing in the morning, whenever possible.  If you have diabetes, take only half of your insulin dose and notify our nurse that you have done so as soon as you arrive at the clinic.  If you are diabetic, but only take blood sugar pills (oral hypoglycemic), then do not take them on the morning of your procedure.  You may take them after you have had the procedure.  Do not take aspirin or any aspirin-containing medications, at least eleven (11) days prior to the procedure.  They may prolong bleeding.  Wear loose fitting clothing that may be easy to take off and that you would not mind if it got stained with Betadine or blood.  Do not wear any jewelry or perfume  Remove any nail coloring.  It will interfere with some of our monitoring equipment.  NOTE: Remember that this is not meant to be interpreted as a complete list of all possible complications.  Unforeseen problems may occur.  BLOOD THINNERS The following drugs contain aspirin or other products, which can cause increased bleeding during surgery and should not be taken for 2 weeks prior  to and 1 week after surgery.  If you should need take something for relief of minor pain, you may take acetaminophen which is found in Tylenol,m Datril, Anacin-3 and Panadol. It is not blood thinner. The products listed below are.  Do not take any of the products listed below in addition to any listed on  your instruction sheet.  A.P.C or A.P.C with Codeine Codeine Phosphate Capsules #3 Ibuprofen Ridaura  ABC compound Congesprin Imuran rimadil  Advil Cope Indocin Robaxisal  Alka-Seltzer Effervescent Pain Reliever and Antacid Coricidin or Coricidin-D  Indomethacin Rufen  Alka-Seltzer plus Cold Medicine Cosprin Ketoprofen S-A-C Tablets  Anacin Analgesic Tablets or Capsules Coumadin Korlgesic Salflex  Anacin Extra Strength Analgesic tablets or capsules CP-2 Tablets Lanoril Salicylate  Anaprox Cuprimine Capsules Levenox Salocol  Anexsia-D Dalteparin Magan Salsalate  Anodynos Darvon compound Magnesium Salicylate Sine-off  Ansaid Dasin Capsules Magsal Sodium Salicylate  Anturane Depen Capsules Marnal Soma  APF Arthritis pain formula Dewitt's Pills Measurin Stanback  Argesic Dia-Gesic Meclofenamic Sulfinpyrazone  Arthritis Bayer Timed Release Aspirin Diclofenac Meclomen Sulindac  Arthritis pain formula Anacin Dicumarol Medipren Supac  Analgesic (Safety coated) Arthralgen Diffunasal Mefanamic Suprofen  Arthritis Strength Bufferin Dihydrocodeine Mepro Compound Suprol  Arthropan liquid Dopirydamole Methcarbomol with Aspirin Synalgos  ASA tablets/Enseals Disalcid Micrainin Tagament  Ascriptin Doan's Midol Talwin  Ascriptin A/D Dolene Mobidin Tanderil  Ascriptin Extra Strength Dolobid Moblgesic Ticlid  Ascriptin with Codeine Doloprin or Doloprin with Codeine Momentum Tolectin  Asperbuf Duoprin Mono-gesic Trendar  Aspergum Duradyne Motrin or Motrin IB Triminicin  Aspirin plain, buffered or enteric coated Durasal Myochrisine Trigesic  Aspirin Suppositories Easprin Nalfon Trillsate  Aspirin with  Codeine Ecotrin Regular or Extra Strength Naprosyn Uracel  Atromid-S Efficin Naproxen Ursinus  Auranofin Capsules Elmiron Neocylate Vanquish  Axotal Emagrin Norgesic Verin  Azathioprine Empirin or Empirin with Codeine Normiflo Vitamin E  Azolid Emprazil Nuprin Voltaren  Bayer Aspirin plain, buffered or children's or timed BC Tablets or powders Encaprin Orgaran Warfarin Sodium  Buff-a-Comp Enoxaparin Orudis Zorpin  Buff-a-Comp with Codeine Equegesic Os-Cal-Gesic   Buffaprin Excedrin plain, buffered or Extra Strength Oxalid   Bufferin Arthritis Strength Feldene Oxphenbutazone   Bufferin plain or Extra Strength Feldene Capsules Oxycodone with Aspirin   Bufferin with Codeine Fenoprofen Fenoprofen Pabalate or Pabalate-SF   Buffets II Flogesic Panagesic   Buffinol plain or Extra Strength Florinal or Florinal with Codeine Panwarfarin   Buf-Tabs Flurbiprofen Penicillamine   Butalbital Compound Four-way cold tablets Penicillin   Butazolidin Fragmin Pepto-Bismol   Carbenicillin Geminisyn Percodan   Carna Arthritis Reliever Geopen Persantine   Carprofen Gold's salt Persistin   Chloramphenicol Goody's Phenylbutazone   Chloromycetin Haltrain Piroxlcam   Clmetidine heparin Plaquenil   Cllnoril Hyco-pap Ponstel   Clofibrate Hydroxy chloroquine Propoxyphen         Before stopping any of these medications, be sure to consult the physician who ordered them.  Some, such as Coumadin (Warfarin) are ordered to prevent or treat serious conditions such as "deep thrombosis", "pumonary embolisms", and other heart problems.  The amount of time that you may need off of the medication may also vary with the medication and the reason for which you were taking it.  If you are taking any of these medications, please make sure you notify your pain physician before you undergo any procedures.         Radiofrequency Lesioning Radiofrequency lesioning is a procedure that is performed to relieve pain. The procedure  is often used for back, neck, or arm pain. Radiofrequency lesioning involves the use of a machine that creates radio waves to make heat. During the procedure, the heat is applied to the nerve that carries the pain signal. The heat damages the nerve and interferes with the pain signal. Pain relief usually lasts for 6 months to 1 year. LET [facility] CARE PROVIDER KNOW ABOUT: 4. Any  allergies you have. 5. All medicines you are taking, including vitamins, herbs, eye drops, creams, and over-the-counter medicines. 6. Previous problems you or members of your family have had with the use of anesthetics. 7. Any blood disorders you have. 8. Previous surgeries you have had. 9. Any medical conditions you have. 10. Whether you are pregnant or may be pregnant. RISKS AND COMPLICATIONS Generally, this is a safe procedure. However, problems may occur, including:  Pain or soreness at the injection site.  Infection at the injection site.  Damage to nerves or blood vessels. BEFORE THE PROCEDURE  Ask your health care provider about:  Changing or stopping your regular medicines. This is especially important if you are taking diabetes medicines or blood thinners.  Taking medicines such as aspirin and ibuprofen. These medicines can thin your blood. Do not take these medicines before your procedure if your health care provider instructs you not to.  Follow instructions from your health care provider about eating or drinking restrictions.  Plan to have someone take you home after the procedure.  If you go home right after the procedure, plan to have someone with you for 24 hours. PROCEDURE  You will be given one or more of the following:  A medicine to help you relax (sedative).  A medicine to numb the area (local anesthetic).  You will be awake during the procedure. You will need to be able to talk with the health care provider during the procedure.  With the help of a type of X-ray (fluoroscopy),  the health care provider will insert a radiofrequency needle into the area to be treated.  Next, a wire that carries the radio waves (electrode) will be put through the radiofrequency needle. An electrical pulse will be sent through the electrode to verify the correct nerve. You will feel a tingling sensation, and you may have muscle twitching.  Then, the tissue that is around the needle tip will be heated by an electric current that is passed using the radiofrequency machine. This will numb the nerves.  A bandage (dressing) will be put on the insertion area after the procedure is done. The procedure may vary among health care providers and hospitals. AFTER THE PROCEDURE 23. Your blood pressure, heart rate, breathing rate, and blood oxygen level will be monitored often until the medicines you were given have worn off. 24. Return to your normal activities as directed by your health care provider.   This information is not intended to replace advice given to you by your health care provider. Make sure you discuss any questions you have with your health care provider.   Document Released: 04/18/2011 Document Revised: 05/11/2015 Document Reviewed: 09/27/2014 Elsevier Interactive Patient Education [employer].

## 2015-11-21 NOTE — Progress Notes (Signed)
Safety precautions to be maintained throughout the outpatient stay will include: orient to surroundings, keep bed in low position, maintain call bell within reach at all times, provide assistance with transfer out of bed and ambulation. No report showing for Dr Marjory Lies.

## 2015-12-06 ENCOUNTER — Ambulatory Visit: Payer: Medicare Other | Attending: Pain Medicine | Admitting: Pain Medicine

## 2015-12-06 ENCOUNTER — Encounter: Payer: Self-pay | Admitting: Pain Medicine

## 2015-12-06 VITALS — BP 133/80 | HR 72 | Temp 96.7°F | Resp 15 | Ht 63.0 in | Wt 175.0 lb

## 2015-12-06 DIAGNOSIS — M47816 Spondylosis without myelopathy or radiculopathy, lumbar region: Secondary | ICD-10-CM | POA: Diagnosis not present

## 2015-12-06 DIAGNOSIS — M545 Low back pain, unspecified: Secondary | ICD-10-CM

## 2015-12-06 DIAGNOSIS — F10239 Alcohol dependence with withdrawal, unspecified: Secondary | ICD-10-CM | POA: Insufficient documentation

## 2015-12-06 DIAGNOSIS — Z951 Presence of aortocoronary bypass graft: Secondary | ICD-10-CM | POA: Insufficient documentation

## 2015-12-06 DIAGNOSIS — G4733 Obstructive sleep apnea (adult) (pediatric): Secondary | ICD-10-CM | POA: Insufficient documentation

## 2015-12-06 DIAGNOSIS — E785 Hyperlipidemia, unspecified: Secondary | ICD-10-CM | POA: Insufficient documentation

## 2015-12-06 DIAGNOSIS — R0789 Other chest pain: Secondary | ICD-10-CM | POA: Insufficient documentation

## 2015-12-06 DIAGNOSIS — G8929 Other chronic pain: Secondary | ICD-10-CM | POA: Diagnosis not present

## 2015-12-06 DIAGNOSIS — E559 Vitamin D deficiency, unspecified: Secondary | ICD-10-CM | POA: Insufficient documentation

## 2015-12-06 DIAGNOSIS — X58XXXA Exposure to other specified factors, initial encounter: Secondary | ICD-10-CM | POA: Insufficient documentation

## 2015-12-06 DIAGNOSIS — J449 Chronic obstructive pulmonary disease, unspecified: Secondary | ICD-10-CM | POA: Insufficient documentation

## 2015-12-06 DIAGNOSIS — M533 Sacrococcygeal disorders, not elsewhere classified: Secondary | ICD-10-CM | POA: Insufficient documentation

## 2015-12-06 DIAGNOSIS — M549 Dorsalgia, unspecified: Secondary | ICD-10-CM | POA: Diagnosis present

## 2015-12-06 DIAGNOSIS — I2510 Atherosclerotic heart disease of native coronary artery without angina pectoris: Secondary | ICD-10-CM | POA: Insufficient documentation

## 2015-12-06 DIAGNOSIS — I252 Old myocardial infarction: Secondary | ICD-10-CM | POA: Diagnosis not present

## 2015-12-06 DIAGNOSIS — S37009A Unspecified injury of unspecified kidney, initial encounter: Secondary | ICD-10-CM | POA: Insufficient documentation

## 2015-12-06 DIAGNOSIS — M79606 Pain in leg, unspecified: Secondary | ICD-10-CM | POA: Diagnosis present

## 2015-12-06 DIAGNOSIS — I10 Essential (primary) hypertension: Secondary | ICD-10-CM | POA: Diagnosis not present

## 2015-12-06 MED ORDER — TRIAMCINOLONE ACETONIDE 40 MG/ML IJ SUSP
40.0000 mg | Freq: Once | INTRAMUSCULAR | Status: AC
Start: 2015-12-06 — End: 2015-12-06
  Administered 2015-12-06: 11:00:00

## 2015-12-06 MED ORDER — ROPIVACAINE HCL 2 MG/ML IJ SOLN
9.0000 mL | Freq: Once | INTRAMUSCULAR | Status: AC
  Administered 2015-12-06: 18 mL

## 2015-12-06 MED ORDER — LIDOCAINE HCL (PF) 1 % IJ SOLN
INTRAMUSCULAR | Status: AC
  Filled 2015-12-06: qty 5

## 2015-12-06 MED ORDER — FENTANYL CITRATE (PF) 100 MCG/2ML IJ SOLN
100.0000 ug | INTRAMUSCULAR | Status: AC
  Administered 2015-12-06: 50 ug via INTRAVENOUS

## 2015-12-06 MED ORDER — MIDAZOLAM HCL 5 MG/5ML IJ SOLN
5.0000 mg | INTRAMUSCULAR | Status: AC
  Administered 2015-12-06: 2 mg via INTRAVENOUS

## 2015-12-06 MED ORDER — FENTANYL CITRATE (PF) 100 MCG/2ML IJ SOLN
INTRAMUSCULAR | Status: AC
  Administered 2015-12-06: 50 ug via INTRAVENOUS
  Filled 2015-12-06: qty 2

## 2015-12-06 MED ORDER — ROPIVACAINE HCL 2 MG/ML IJ SOLN
INTRAMUSCULAR | Status: AC
  Administered 2015-12-06: 18 mL
  Filled 2015-12-06: qty 20

## 2015-12-06 MED ORDER — MIDAZOLAM HCL 5 MG/5ML IJ SOLN
INTRAMUSCULAR | Status: AC
  Administered 2015-12-06: 2 mg via INTRAVENOUS
  Filled 2015-12-06: qty 5

## 2015-12-06 MED ORDER — LACTATED RINGERS IV SOLN: 1000.0000 mL | INTRAVENOUS | Status: AC

## 2015-12-06 MED ORDER — LIDOCAINE HCL (PF) 1 % IJ SOLN
INTRAMUSCULAR | Status: AC
  Administered 2015-12-06: 10 mL
  Filled 2015-12-06: qty 10

## 2015-12-06 MED ORDER — TRIAMCINOLONE ACETONIDE 40 MG/ML IJ SUSP
INTRAMUSCULAR | Status: AC
  Filled 2015-12-06: qty 2

## 2015-12-06 MED ORDER — LIDOCAINE HCL (PF) 1 % IJ SOLN
10.0000 mL | Freq: Once | INTRAMUSCULAR | Status: AC
  Administered 2015-12-06: 10 mL

## 2015-12-06 NOTE — Progress Notes (Signed)
Safety precautions to be maintained throughout the outpatient stay will include: orient to surroundings, keep bed in low position, maintain call bell within reach at all times, provide assistance with transfer out of bed and ambulation.  

## 2015-12-06 NOTE — Patient Instructions (Signed)
Pain Management Discharge Instructions  General Discharge Instructions :  If you need to reach your doctor call: Monday-Friday 8:00 am - 4:00 pm at [phone] or toll free [phone].  After clinic hours [phone] to have operator reach doctor.  Bring all of your medication bottles to all your appointments in the pain clinic.  To cancel or reschedule your appointment with Pain Management please remember to call 24 hours in advance to avoid a fee.  Refer to the educational materials which you have been given on: General Risks, I had my Procedure. Discharge Instructions, Post Sedation.  Post Procedure Instructions:  The drugs you were given will stay in your system until tomorrow, so for the next 24 hours you should not drive, make any legal decisions or drink any alcoholic beverages.  You may eat anything you prefer, but it is better to start with liquids then soups and crackers, and gradually work up to solid foods.  Please notify your doctor immediately if you have any unusual bleeding, trouble breathing or pain that is not related to your normal pain.  Depending on the type of procedure that was done, some parts of your body may feel week and/or numb.  This usually clears up by tonight or the next day.  Walk with the use of an assistive device or accompanied by an adult for the 24 hours.  You may use ice on the affected area for the first 24 hours.  Put ice in a Ziploc bag and cover with a towel and place against area 15 minutes on 15 minutes off.  You may switch to heat after 24 hours.GENERAL RISKS AND COMPLICATIONS  What are the risk, side effects and possible complications? Generally speaking, most procedures are safe.  However, with any procedure there are risks, side effects, and the possibility of complications.  The risks and complications are dependent upon the sites that are lesioned, or the type of nerve block to be performed.  The closer the procedure is to the spine,  the more serious the risks are.  Great care is taken when placing the radio frequency needles, block needles or lesioning probes, but sometimes complications can occur. 1. Infection: Any time there is an injection through the skin, there is a risk of infection.  This is why sterile conditions are used for these blocks.  There are four possible types of infection. 1. Localized skin infection. 2. Central Nervous System Infection-This can be in the form of Meningitis, which can be deadly. 3. Epidural Infections-This can be in the form of an epidural abscess, which can cause pressure inside of the spine, causing compression of the spinal cord with subsequent paralysis. This would require an emergency surgery to decompress, and there are no guarantees that the patient would recover from the paralysis. 4. Discitis-This is an infection of the intervertebral discs.  It occurs in about 1% of discography procedures.  It is difficult to treat and it may lead to surgery.        2. Pain: the needles have to go through skin and soft tissues, will cause soreness.       3. Damage to internal structures:  The nerves to be lesioned may be near blood vessels or    other nerves which can be potentially damaged.       4. Bleeding: Bleeding is more common if the patient is taking blood thinners such as  aspirin, Coumadin, Ticiid, Plavix, etc., or if he/she have some genetic predisposition  such as   hemophilia. Bleeding into the spinal canal can cause compression of the spinal  cord with subsequent paralysis.  This would require an emergency surgery to  decompress and there are no guarantees that the patient would recover from the  paralysis.       5. Pneumothorax:  Puncturing of a lung is a possibility, every time a needle is introduced in  the area of the chest or upper back.  Pneumothorax refers to free air around the  collapsed lung(s), inside of the thoracic cavity (chest cavity).  Another two possible  complications  related to a similar event would include: Hemothorax and Chylothorax.   These are variations of the Pneumothorax, where instead of air around the collapsed  lung(s), you may have blood or chyle, respectively.       6. Spinal headaches: They may occur with any procedures in the area of the spine.       7. Persistent CSF (Cerebro-Spinal Fluid) leakage: This is a rare problem, but may occur  with prolonged intrathecal or epidural catheters either due to the formation of a fistulous  track or a dural tear.       8. Nerve damage: By working so close to the spinal cord, there is always a possibility of  nerve damage, which could be as serious as a permanent spinal cord injury with  paralysis.       9. Death:  Although rare, severe deadly allergic reactions known as "Anaphylactic  reaction" can occur to any of the medications used.      10. Worsening of the symptoms:  We can always make thing worse.  What are the chances of something like this happening? Chances of any of this occuring are extremely low.  By statistics, you have more of a chance of getting killed in a motor vehicle accident: while driving to the hospital than any of the above occurring .  Nevertheless, you should be aware that they are possibilities.  In general, it is similar to taking a shower.  Everybody knows that you can slip, hit your head and get killed.  Does that mean that you should not shower again?  Nevertheless always keep in mind that statistics do not mean anything if you happen to be on the wrong side of them.  Even if a procedure has a 1 (one) in a 1,000,000 (million) chance of going wrong, it you happen to be that one..Also, keep in mind that by statistics, you have more of a chance of having something go wrong when taking medications.  Who should not have this procedure? If you are on a blood thinning medication (e.g. Coumadin, Plavix, see list of "Blood Thinners"), or if you have an active infection going on, you should not  have the procedure.  If you are taking any blood thinners, please inform your physician.  How should I prepare for this procedure?  Do not eat or drink anything at least six hours prior to the procedure.  Bring a driver with you .  It cannot be a taxi.  Come accompanied by an adult that can drive you back, and that is strong enough to help you if your legs get weak or numb from the local anesthetic.  Take all of your medicines the morning of the procedure with just enough water to swallow them.  If you have diabetes, make sure that you are scheduled to have your procedure done first thing in the morning, whenever possible.  If you have diabetes,   take only half of your insulin dose and notify our nurse that you have done so as soon as you arrive at the clinic.  If you are diabetic, but only take blood sugar pills (oral hypoglycemic), then do not take them on the morning of your procedure.  You may take them after you have had the procedure.  Do not take aspirin or any aspirin-containing medications, at least eleven (11) days prior to the procedure.  They may prolong bleeding.  Wear loose fitting clothing that may be easy to take off and that you would not mind if it got stained with Betadine or blood.  Do not wear any jewelry or perfume  Remove any nail coloring.  It will interfere with some of our monitoring equipment.  NOTE: Remember that this is not meant to be interpreted as a complete list of all possible complications.  Unforeseen problems may occur.  BLOOD THINNERS The following drugs contain aspirin or other products, which can cause increased bleeding during surgery and should not be taken for 2 weeks prior to and 1 week after surgery.  If you should need take something for relief of minor pain, you may take acetaminophen which is found in Tylenol,m Datril, Anacin-3 and Panadol. It is not blood thinner. The products listed below are.  Do not take any of the products listed below  in addition to any listed on your instruction sheet.  A.P.C or A.P.C with Codeine Codeine Phosphate Capsules #3 Ibuprofen Ridaura  ABC compound Congesprin Imuran rimadil  Advil Cope Indocin Robaxisal  Alka-Seltzer Effervescent Pain Reliever and Antacid Coricidin or Coricidin-D  Indomethacin Rufen  Alka-Seltzer plus Cold Medicine Cosprin Ketoprofen S-A-C Tablets  Anacin Analgesic Tablets or Capsules Coumadin Korlgesic Salflex  Anacin Extra Strength Analgesic tablets or capsules CP-2 Tablets Lanoril Salicylate  Anaprox Cuprimine Capsules Levenox Salocol  Anexsia-D Dalteparin Magan Salsalate  Anodynos Darvon compound Magnesium Salicylate Sine-off  Ansaid Dasin Capsules Magsal Sodium Salicylate  Anturane Depen Capsules Marnal Soma  APF Arthritis pain formula Dewitt's Pills Measurin Stanback  Argesic Dia-Gesic Meclofenamic Sulfinpyrazone  Arthritis Bayer Timed Release Aspirin Diclofenac Meclomen Sulindac  Arthritis pain formula Anacin Dicumarol Medipren Supac  Analgesic (Safety coated) Arthralgen Diffunasal Mefanamic Suprofen  Arthritis Strength Bufferin Dihydrocodeine Mepro Compound Suprol  Arthropan liquid Dopirydamole Methcarbomol with Aspirin Synalgos  ASA tablets/Enseals Disalcid Micrainin Tagament  Ascriptin Doan's Midol Talwin  Ascriptin A/D Dolene Mobidin Tanderil  Ascriptin Extra Strength Dolobid Moblgesic Ticlid  Ascriptin with Codeine Doloprin or Doloprin with Codeine Momentum Tolectin  Asperbuf Duoprin Mono-gesic Trendar  Aspergum Duradyne Motrin or Motrin IB Triminicin  Aspirin plain, buffered or enteric coated Durasal Myochrisine Trigesic  Aspirin Suppositories Easprin Nalfon Trillsate  Aspirin with Codeine Ecotrin Regular or Extra Strength Naprosyn Uracel  Atromid-S Efficin Naproxen Ursinus  Auranofin Capsules Elmiron Neocylate Vanquish  Axotal Emagrin Norgesic Verin  Azathioprine Empirin or Empirin with Codeine Normiflo Vitamin E  Azolid Emprazil Nuprin Voltaren  Bayer  Aspirin plain, buffered or children's or timed BC Tablets or powders Encaprin Orgaran Warfarin Sodium  Buff-a-Comp Enoxaparin Orudis Zorpin  Buff-a-Comp with Codeine Equegesic Os-Cal-Gesic   Buffaprin Excedrin plain, buffered or Extra Strength Oxalid   Bufferin Arthritis Strength Feldene Oxphenbutazone   Bufferin plain or Extra Strength Feldene Capsules Oxycodone with Aspirin   Bufferin with Codeine Fenoprofen Fenoprofen Pabalate or Pabalate-SF   Buffets II Flogesic Panagesic   Buffinol plain or Extra Strength Florinal or Florinal with Codeine Panwarfarin   Buf-Tabs Flurbiprofen Penicillamine   Butalbital Compound Four-way cold tablets   Penicillin   Butazolidin Fragmin Pepto-Bismol   Carbenicillin Geminisyn Percodan   Carna Arthritis Reliever Geopen Persantine   Carprofen Gold's salt Persistin   Chloramphenicol Goody's Phenylbutazone   Chloromycetin Haltrain Piroxlcam   Clmetidine heparin Plaquenil   Cllnoril Hyco-pap Ponstel   Clofibrate Hydroxy chloroquine Propoxyphen         Before stopping any of these medications, be sure to consult the physician who ordered them.  Some, such as Coumadin (Warfarin) are ordered to prevent or treat serious conditions such as "deep thrombosis", "pumonary embolisms", and other heart problems.  The amount of time that you may need off of the medication may also vary with the medication and the reason for which you were taking it.  If you are taking any of these medications, please make sure you notify your pain physician before you undergo any procedures.         Facet Joint Block, Care After Refer to this sheet in the next few weeks. These instructions provide you with information on caring for yourself after your procedure. Your health care provider may also give you more specific instructions. Your treatment has been planned according to current medical practices, but problems sometimes occur. Call your health care provider if you have any problems  or questions after your procedure. HOME CARE INSTRUCTIONS  2. Keep track of the amount of pain relief you feel and how long it lasts. 3. Limit pain medicine within the first 4-6 hours after the procedure as directed by your health care provider. 4. Resume taking dietary supplements and medicines as directed by your health care provider. 5. You may resume your regular diet. 6. Do not apply heat near or over the injection site(s) for 24 hours.  7. Do not take a bath or soak in water (such as a pool or lake) for 24 hours. 8. Do not drive for 24 hours unless approved by your health care provider. 9. Avoid strenuous activity for 24 hours. 10. Remove your bandages the morning after the procedure.  11. If the injection site is tender, applying an ice pack may relieve some tenderness. To do this: 1. Put ice in a bag. 2. Place a towel between your skin and the bag. 3. Leave the ice on for 15-20 minutes, 3-4 times a day. 12. Keep follow-up appointments as directed by your health care provider. SEEK MEDICAL CARE IF:   Your pain is not controlled by your medicines.   There is drainage from the injection site.   There is significant bleeding or swelling at the injection site.  You have diabetes and your blood sugar is above 180 mg/dL. SEEK IMMEDIATE MEDICAL CARE IF:   You develop a fever of 101F (38.3C) or greater.   You have worsening pain or swelling around the injection site.   You have red streaking around the injection site.   You develop severe pain that is not controlled by your medicines.   You develop a headache, stiff neck, nausea, or vomiting.   Your eyes become very sensitive to light.   You have weakness, paralysis, or tingling in your arms or legs that was not present before the procedure.   You develop difficulty urinating or breathing.    This information is not intended to replace advice given to you by your health care provider. Make sure you discuss any  questions you have with your health care provider.   Document Released: 08/06/2012 Document Revised: 09/10/2014 Document Reviewed: 08/06/2012 Elsevier Interactive Patient Education 2016   [employer] Facet Joint Block The facet joints connect the bones of the spine (vertebrae). They make it possible for you to bend, twist, and make other movements with your spine. They also prevent you from overbending, overtwisting, and making other excessive movements.  A facet joint block is a procedure where a numbing medicine (anesthetic) is injected into a facet joint. Often, a type of anti-inflammatory medicine called a steroid is also injected. A facet joint block may be done for two reasons:  13. Diagnosis. A facet joint block may be done as a test to see whether neck or back pain is caused by a worn-down or infected facet joint. If the pain gets better after a facet joint block, it means the pain is probably coming from the facet joint. If the pain does not get better, it means the pain is probably not coming from the facet joint.  14. Therapy. A facet joint block may be done to relieve neck or back pain caused by a facet joint. A facet joint block is only done as a therapy if the pain does not improve with medicine, exercise programs, physical therapy, and other forms of pain management. LET YOUR HEALTH CARE PROVIDER KNOW ABOUT:   Any allergies you have.   All medicines you are taking, including vitamins, herbs, eyedrops, and over-the-counter medicines and creams.   Previous problems you or members of your family have had with the use of anesthetics.   Any blood disorders you have had.   Other health problems you have. RISKS AND COMPLICATIONS Generally, having a facet joint block is safe. However, as with any procedure, complications can occur. Possible complications associated with having a facet joint block include:   Bleeding.   Injury to a nerve near the injection site.   Pain at the  injection site.   Weakness or numbness in areas controlled by nerves near the injection site.   Infection.   Temporary fluid retention.   Allergic reaction to anesthetics or medicines used during the procedure. BEFORE THE PROCEDURE   Follow your health care provider's instructions if you are taking dietary supplements or medicines. You may need to stop taking them or reduce your dosage.   Do not take any new dietary supplements or medicines without asking your health care provider first.   Follow your health care provider's instructions about eating and drinking before the procedure. You may need to stop eating and drinking several hours before the procedure.   Arrange to have an adult drive you home after the procedure. PROCEDURE 12. You may need to remove your clothing and dress in an open-back gown so that your health care provider can access your spine.  13. The procedure will be done while you are lying on an X-ray table. Most of the time you will be asked to lie on your stomach, but you may be asked to lie in a different position if an injection will be made in your neck.  14. Special machines will be used to monitor your oxygen levels, heart rate, and blood pressure.  15. If an injection will be made in your neck, an intravenous (IV) tube will be inserted into one of your veins. Fluids and medicine will flow directly into your body through the IV tube.  16. The area over the facet joint where the injection will be made will be cleaned with an antiseptic soap. The surrounding skin will be covered with sterile drapes.  17. An anesthetic will be applied to   your skin to make the injection area numb. You may feel a temporary stinging or burning sensation.  18. A video X-ray machine will be used to locate the joint. A contrast dye may be injected into the facet joint area to help with locating the joint.  19. When the joint is located, an anesthetic medicine will be injected  into the joint through the needle.  20. Your health care provider will ask you whether you feel pain relief. If you do feel relief, a steroid may be injected to provide pain relief for a longer period of time. If you do not feel relief or feel only partial relief, additional injections of an anesthetic may be made in other facet joints.  21. The needle will be removed, the skin will be cleansed, and bandages will be applied.  AFTER THE PROCEDURE   You will be observed for 15-30 minutes before being allowed to go home. Do not drive. Have an adult drive you or take a taxi or public transportation instead.   If you feel pain relief, the pain will return in several hours or days when the anesthetic wears off.   You may feel pain relief 2-14 days after the procedure. The amount of time this relief lasts varies from person to person.   It is normal to feel some tenderness over the injected area(s) for 2 days following the procedure.   If you have diabetes, you may have a temporary increase in blood sugar.   This information is not intended to replace advice given to you by your health care provider. Make sure you discuss any questions you have with your health care provider.   Document Released: 01/09/2007 Document Revised: 09/10/2014 Document Reviewed: 06/09/2012 Elsevier Interactive Patient Education 2016 [employer]  

## 2015-12-06 NOTE — Progress Notes (Signed)
Patient's Name: [name] [name]. MRN: [id number] DOB: 04-Apr-1956 DOS: 12/06/2015  Primary Reason(s) for Visit: Interventional Pain Management Treatment. CC: Leg Pain and Back Pain   Pre-Procedure Assessment:  [name] [name] is a 60 y.o. year old, male patient, seen today for interventional treatment. He has Apnea, sleep; H/O coronary artery bypass surgery; Obstructive apnea; BP (high blood pressure); HLD (hyperlipidemia); Arteriosclerosis of coronary artery; Chronic obstructive pulmonary disease ([place]); Alcohol withdrawal syndrome ([place]); Injury of kidney; Chronic low back pain (Bilateral) (R>L); Chronic shoulder pain (Location of Tertiary source of pain) (Right); Chronic hip pain (Left); Chronic pain; History of alcoholism ([place]); Opiate use; Encounter for therapeutic drug level monitoring; Chronic chest wall pain (Location of Primary Source of Pain) (Incisional Midline) (since 04/22/2012); Chronic lower extremity pain (Location of Secondary source of pain) (Bilateral) (R>L); History of MI (myocardial infarction) (January 2014); Encounter for pain management planning; Chronic sacroiliac joint pain (Bilateral) (L>R); Neuropathic pain; Neurogenic pain; Incisional pain; Elevated sedimentation rate; Vitamin D insufficiency; Lumbar facet syndrome (Bilateral) (R>L); and Lumbar spondylosis on his problem list.. His primarily concern today is the Leg Pain and Back Pain   Today's Initial Pain Score: 7/10 Reported level of pain is incompatible with clinical obrservations. This may be secondary to a possible lack of understanding on how the pain scale works. Pain Type: Chronic pain Pain Location: Leg Pain Orientation: Right, Left Pain Descriptors / Indicators: Aching, Constant, Radiating Pain Frequency: Constant  Post-procedure Pain Score: 0-No pain  Date of Last Visit: 11/21/15 Service Provided on Last Visit: Med Refill  Verification of the correct person, correct site (including marking of site), and  correct procedure were performed and confirmed by the patient.  Today's Vitals   12/06/15 1105 12/06/15 1112 12/06/15 1122 12/06/15 1132  BP: 139/83 149/80 160/86 133/80  Pulse: 74 77 74 72  Temp:  96.8 F (36 C)  96.7 F (35.9 C)  Resp: _0 Height:      Weight:      SpO2: 96% 94% 95% 94%  PainSc: 0-No pain   0-No pain  PainLoc:      Calculated BMI: Body mass index is 31.01 kg/(m^2). Allergies: He has No Known Allergies.. Primary Diagnosis: Facet syndrome, lumbar [M54.5]  Procedure:  Type: Diagnostic Medial Branch Facet Block Region: Lumbar Level: L2, L3, L4, L5, & S1 Medial Branch Level(s) Laterality: Bilateral  Indications: 1. Lumbar facet syndrome (Bilateral) (R>L)   2. Lumbar spondylosis, unspecified spinal osteoarthritis   3. Chronic low back pain (Bilateral) (R>L)     In addition, [name] [name] has Chronic low back pain (Bilateral) (R>L); Chronic shoulder pain (Location of Tertiary source of pain) (Right); Chronic hip pain (Left); Chronic pain; Chronic chest wall pain (Location of Primary Source of Pain) (Incisional Midline) (since 04/22/2012); Chronic lower extremity pain (Location of Secondary source of pain) (Bilateral) (R>L); Chronic sacroiliac joint pain (Bilateral) (L>R); Neuropathic pain; Neurogenic pain; Incisional pain; Lumbar facet syndrome (Bilateral) (R>L); and Lumbar spondylosis on his pertinent problem list.  Consent: Secured. Under the influence of no sedatives a written informed consent was obtained, after having provided information on the risks and possible complications. To fulfill our ethical and legal obligations, as recommended by the American Medical Association's Code of Ethics, we have provided information to the patient about our clinical impression; the nature and purpose of the treatment or procedure; the risks, benefits, and possible complications of the intervention; alternatives; the risk(s) and benefit(s) of the alternative treatment(s)  or procedure(s); and the risk(s)  and benefit(s) of doing nothing. The patient was provided information about the risks and possible complications associated with the procedure. In the case of spinal procedures these may include, but are not limited to, failure to achieve desired goals, infection, bleeding, organ or nerve damage, allergic reactions, paralysis, and death. In addition, the patient was informed that Medicine is not an exact science; therefore, there is also the possibility of unforeseen risks and possible complications that may result in a catastrophic outcome. The patient indicated having understood very clearly. We have given the patient no guarantees and we have made no promises. Enough time was given to the patient to ask questions, all of which were answered to the patient's satisfaction.  Pre-Procedure Preparation: Safety Precautions: Allergies reviewed. Appropriate site, procedure, and patient were confirmed by following the Joint Commission's Universal Protocol (UP.01.01.01), in the form of a "Time Out". The patient was asked to confirm marked site and procedure, before commencing. The patient was asked about blood thinners, or active infections, both of which were denied. Patient was assessed for positional comfort and all pressure points were checked before starting procedure. Monitoring:  As per clinic protocol. Infection Control Precautions: Sterile technique used. Standard Universal Precautions were taken as recommended by the Department of [facility] for Disease Control and Prevention (CDC). Standard pre-surgical skin prep was conducted. Respiratory hygiene and cough etiquette was practiced. Hand hygiene observed. Safe injection practices and needle disposal techniques followed. SDV (single dose vial) medications used. Medications properly checked for expiration dates and contaminants. Personal protective equipment (PPE) used: Sterile double glove technique. Radiation  resistant gloves. Sterile surgical gloves.  Anesthesia, Analgesia, Anxiolysis: Type: Moderate (Conscious) Sedation & Local Anesthesia Local Anesthetic: Lidocaine 1% Route: Intravenous (IV) IV Access: Secured Sedation: Meaningful verbal contact was maintained at all times during the procedure  Indication(s): Analgesia & Anxiolysis  Description of Procedure Process:  Time-out: "Time-out" completed before starting procedure, as per protocol. Position: Prone Target Area: For Lumbar Facet blocks, the target is the groove formed by the junction of the transverse process and superior articular process. For the L5 dorsal ramus, the target is the notch between superior articular process and sacral ala. For the S1 dorsal ramus, the target is the superior and lateral edge of the posterior S1 Sacral foramen. Approach: Paramedial approach. Area Prepped: Entire Posterior Lumbosacral Region Prepping solution: ChloraPrep (2% chlorhexidine gluconate and 70% isopropyl alcohol) Safety Precautions: Aspiration looking for blood return was conducted prior to all injections. At no point did we inject any substances, as a needle was being advanced. No attempts were made at seeking any paresthesias. Safe injection practices and needle disposal techniques used. Medications properly checked for expiration dates. SDV (single dose vial) medications used.   Description of the Procedure: Protocol guidelines were followed. The patient was placed in position over the fluoroscopy table. The target area was identified and the area prepped in the usual manner. Skin desensitized using vapocoolant spray. Skin & deeper tissues infiltrated with local anesthetic. Appropriate amount of time allowed to pass for local anesthetics to take effect. The procedure needle was introduced through the skin, ipsilateral to the reported pain, and advanced to the target area. Employing the "Medial Branch Technique", the needles were advanced to the  angle made by the superior and medial portion of the transverse process, and the lateral and inferior portion of the superior articulating process of the targeted vertebral bodies. This area is known as "Burton's Eye" or the "Eye of the [place] Dog". A procedure  needle was introduced through the skin, and this time advanced to the angle made by the superior and medial border of the sacral ala, and the lateral border of the S1 vertebral body. This last needle was later repositioned at the superior and lateral border of the posterior S1 foramen. Negative aspiration confirmed. Solution injected in intermittent fashion, asking for systemic symptoms every 0.5cc of injectate. The needles were then removed and the area cleansed, making sure to leave some of the prepping solution back to take advantage of its long term bactericidal properties. EBL: None Materials & Medications Used:  Needle(s) Used: 22g - 3.5" Spinal Needle(s) Medications Administered today: We administered fentaNYL, midazolam, lidocaine (PF), ropivacaine (PF) 2 mg/ml (0.2%), triamcinolone acetonide, lidocaine (PF), ropivacaine (PF) 2 mg/ml (0.2%), fentaNYL, triamcinolone acetonide, and midazolam.Please see chart orders for dosing details.  Imaging Guidance:  Type of Imaging Technique: Fluoroscopy Guidance (Spinal) Indication(s): Assistance in needle guidance and placement for procedures requiring needle placement in or near specific anatomical locations not easily accessible without such assistance. Exposure Time: Please see nurses notes. Contrast: None required. Fluoroscopic Guidance: I was personally present in the fluoroscopy suite, where the patient was placed in position for the procedure, over the fluoroscopy-compatible table. Fluoroscopy was manipulated, using "Tunnel Vision Technique", to obtain the best possible view of the target area, on the affected side. Parallax error was corrected before commencing the procedure. A  "direction-depth-direction" technique was used to introduce the needle under continuous pulsed fluoroscopic guidance. Once the target was reached, antero-posterior, oblique, and lateral fluoroscopic projection views were taken to confirm needle placement in all planes. Permanently recorded images stored by scanning into EMR. Interpretation: Intraoperative imaging interpretation by performing Physician. Adequate needle placement confirmed. Adequate needle placement confirmed in AP, lateral, & Oblique Views. No contrast injected.  Antibiotic Prophylaxis:  Indication(s): No indications identified. Type:  Antibiotics Given (last 72 hours)    None       Post-operative Assessment:  Complications: No immediate post-treatment complications were observed. Disposition: Return to clinic for follow-up evaluation. The patient tolerated the entire procedure well. A repeat set of vitals were taken after the procedure and the patient was kept under observation following institutional policy, for this procedure. Post-procedural neurological assessment was performed, showing return to baseline, prior to discharge. The patient was discharged home, once institutional criteria were met. The patient was provided with post-procedure discharge instructions, including a section on how to identify potential problems. Should any problems arise concerning this procedure, the patient was given instructions to immediately contact us, at any time, without hesitation. In any case, we plan to contact the patient by telephone for a follow-up status report regarding this interventional procedure. Comments:  No additional relevant information.  Medications administered during this visit: We administered fentaNYL, midazolam, lidocaine (PF), ropivacaine (PF) 2 mg/ml (0.2%), triamcinolone acetonide, lidocaine (PF), ropivacaine (PF) 2 mg/ml (0.2%), fentaNYL, triamcinolone acetonide, and midazolam.  Prescriptions ordered during this  visit: New Prescriptions   No medications on file    Future Appointments Date Time Provider [place]  01/03/2016 1:40 PM Milinda Pointer, MD [facility] None    Primary Care Physician: Elisabeth Cara, NP Location: [facility] Note by: Kathlen Brunswick. Dossie Arbour, M.D, DABA, DABAPM, DABPM, DABIPP, FIPP   Illustration of the posterior view of the lumbar spine and the posterior neural structures. Laminae of L2 through S1 are labeled. DPRL5, dorsal primary ramus of L5; DPRS1, dorsal primary ramus of S1; DPR3, dorsal primary ramus of L3; FJ, facet (zygapophyseal)  joint L3-L4; I, inferior articular process of L4; LB1, lateral branch of dorsal primary ramus of L1; IAB, inferior articular branches from L3 medial branch (supplies L4-L5 facet joint); IBP, intermediate branch plexus; MB3, medial branch of dorsal primary ramus of L3; NR3, third lumbar nerve root; S, superior articular process of L5; SAB, superior articular branches from L4 (supplies L4-5 facet joint also); TP3, transverse process of L3.  Disclaimer:  Medicine is not an Chief Strategy Officer. The only guarantee in medicine is that nothing is guaranteed. It is important to note that the decision to proceed with this intervention was based on the information collected from the patient. The Data and conclusions were drawn from the patient's questionnaire, the interview, and the physical examination. Because the information was provided in large part by the patient, it cannot be guaranteed that it has not been purposely or unconsciously manipulated. Every effort has been made to obtain as much relevant data as possible for this evaluation. It is important to note that the conclusions that lead to this procedure are derived in large part from the available data. Always take into account that the treatment will also be dependent on availability of resources and existing treatment guidelines, considered by other Pain Management  Practitioners as being common knowledge and practice, at the time of the intervention. For Medico-Legal purposes, it is also important to point out that variation in procedural techniques and pharmacological choices are the acceptable norm. The indications, contraindications, technique, and results of the above procedure should only be interpreted and judged by a Board-Certified Interventional Pain Specialist with extensive familiarity and expertise in the same exact procedure and technique. Attempts at providing opinions without similar or greater experience and expertise than that of the treating physician will be considered as inappropriate and unethical, and shall result in a formal complaint to the state medical board and applicable specialty societies.

## 2015-12-07 ENCOUNTER — Telehealth: Payer: Self-pay | Admitting: *Deleted

## 2015-12-07 NOTE — Telephone Encounter (Signed)
Message left

## 2015-12-13 ENCOUNTER — Telehealth: Payer: Self-pay

## 2015-12-16 ENCOUNTER — Emergency Department
Admission: EM | Admit: 2015-12-16 | Discharge: 2015-12-16 | Disposition: A | Discharge status: Home / Self Care | Payer: Medicare Other | Attending: Emergency Medicine | Admitting: Emergency Medicine

## 2015-12-16 ENCOUNTER — Emergency Department: Payer: Medicare Other

## 2015-12-16 ENCOUNTER — Encounter: Payer: Self-pay | Admitting: Emergency Medicine

## 2015-12-16 DIAGNOSIS — R0789 Other chest pain: Secondary | ICD-10-CM | POA: Diagnosis not present

## 2015-12-16 DIAGNOSIS — I252 Old myocardial infarction: Secondary | ICD-10-CM | POA: Diagnosis not present

## 2015-12-16 DIAGNOSIS — E785 Hyperlipidemia, unspecified: Secondary | ICD-10-CM | POA: Diagnosis not present

## 2015-12-16 DIAGNOSIS — Z951 Presence of aortocoronary bypass graft: Secondary | ICD-10-CM | POA: Diagnosis not present

## 2015-12-16 DIAGNOSIS — J449 Chronic obstructive pulmonary disease, unspecified: Secondary | ICD-10-CM | POA: Insufficient documentation

## 2015-12-16 DIAGNOSIS — Z87891 Personal history of nicotine dependence: Secondary | ICD-10-CM | POA: Diagnosis not present

## 2015-12-16 DIAGNOSIS — I10 Essential (primary) hypertension: Secondary | ICD-10-CM | POA: Diagnosis not present

## 2015-12-16 DIAGNOSIS — Z7982 Long term (current) use of aspirin: Secondary | ICD-10-CM | POA: Insufficient documentation

## 2015-12-16 DIAGNOSIS — Z79899 Other long term (current) drug therapy: Secondary | ICD-10-CM | POA: Insufficient documentation

## 2015-12-16 DIAGNOSIS — I2510 Atherosclerotic heart disease of native coronary artery without angina pectoris: Secondary | ICD-10-CM | POA: Diagnosis not present

## 2015-12-16 DIAGNOSIS — R079 Chest pain, unspecified: Secondary | ICD-10-CM

## 2015-12-16 LAB — FIBRIN DERIVATIVES D-DIMER (ARMC ONLY): FIBRIN DERIVATIVES D-DIMER (ARMC): 202 (ref 0–499)

## 2015-12-16 LAB — BASIC METABOLIC PANEL
ANION GAP: 8 (ref 5–15)
BUN: 37 mg/dL — ABNORMAL HIGH (ref 6–20)
CALCIUM: 9 mg/dL (ref 8.9–10.3)
CO2: 23 mmol/L (ref 22–32)
Chloride: 100 mmol/L — ABNORMAL LOW (ref 101–111)
Creatinine, Ser: 1.17 mg/dL (ref 0.61–1.24)
Glucose, Bld: 221 mg/dL — ABNORMAL HIGH (ref 65–99)
Potassium: 3.9 mmol/L (ref 3.5–5.1)
SODIUM: 131 mmol/L — AB (ref 135–145)

## 2015-12-16 LAB — TROPONIN I

## 2015-12-16 LAB — CBC
HCT: 45.4 % (ref 40.0–52.0)
HEMOGLOBIN: 15.6 g/dL (ref 13.0–18.0)
MCH: 30.7 pg (ref 26.0–34.0)
MCHC: 34.3 g/dL (ref 32.0–36.0)
MCV: 89.5 fL (ref 80.0–100.0)
Platelets: 238 10*3/uL (ref 150–440)
RBC: 5.07 MIL/uL (ref 4.40–5.90)
RDW: 13.8 % (ref 11.5–14.5)
WBC: 16.3 10*3/uL — AB (ref 3.8–10.6)

## 2015-12-16 MED ORDER — ASPIRIN 81 MG PO CHEW
243.0000 mg | CHEWABLE_TABLET | Freq: Once | ORAL | Status: AC
  Administered 2015-12-16: 243 mg via ORAL
  Filled 2015-12-16: qty 3

## 2015-12-16 NOTE — ED Notes (Signed)
Describes sudden onset of chest pain that radiated across chest at around 1515.  NTG taken, which improved pain;

## 2015-12-16 NOTE — ED Provider Notes (Signed)
[facility] Emergency Department Provider Note  ____________________________________________  Time seen: Approximately [id number] PM  I have reviewed the triage vital signs and the nursing notes.   HISTORY  Chief Complaint Chest Pain    HPI [name] [name]. is a 60 y.o. male with a history of coronary artery disease status post CABG and stenting, last intervention in 2013, who is presenting to the emergency department today for 10-20 minutes of stabbing chest pain that started at about 3 PM today. The patient says that he was at rest when the pain started. It was nonradiating. It was sharp in quality. It was associated with some mild shortness of breath. Denies any nausea, vomiting or diaphoresis at the time. He said that he took one nitroglycerin tab and the pain was relieved after that. He has not experienced any pain since.Denies any pain with movement of his upper extremities. The patient said that the chest pain was a 9 out of 10 and started suddenly.   Past Medical History  Diagnosis Date  . Diverticulitis   . Hypertension   . Gout   . Sleep apnea   . Hyperlipidemia   . Cardiomyopathy, secondary ([place])   . Coronary artery disease     Patient Active Problem List   Diagnosis Date Noted  . Vitamin D insufficiency 11/21/2015  . Lumbar facet syndrome (Bilateral) (R>L) 11/21/2015  . Lumbar spondylosis 11/21/2015  . Elevated sedimentation rate 11/01/2015  . Chronic low back pain (Bilateral) (R>L) 10/25/2015  . Chronic shoulder pain (Location of Tertiary source of pain) (Right) 10/25/2015  . Chronic hip pain (Left) 10/25/2015  . Chronic pain 10/25/2015  . History of alcoholism ([place]) 10/25/2015  . Opiate use 10/25/2015  . Encounter for therapeutic drug level monitoring 10/25/2015  . Chronic chest wall pain (Location of Primary Source of Pain) (Incisional Midline) (since 04/22/2012) 10/25/2015  . Chronic lower extremity pain (Location of Secondary source of  pain) (Bilateral) (R>L) 10/25/2015  . History of MI (myocardial infarction) (January 2014) 10/25/2015  . Encounter for pain management planning 10/25/2015  . Chronic sacroiliac joint pain (Bilateral) (L>R) 10/25/2015  . Neuropathic pain 10/25/2015  . Neurogenic pain 10/25/2015  . Incisional pain 10/25/2015  . HLD (hyperlipidemia) 03/16/2014  . H/O coronary artery bypass surgery 05/12/2012  . Alcohol withdrawal syndrome ([place]) 04/28/2012  . Injury of kidney 04/25/2012  . Obstructive apnea 04/22/2012  . BP (high blood pressure) 04/22/2012  . Arteriosclerosis of coronary artery 04/22/2012  . Apnea, sleep 04/21/2012  . Chronic obstructive pulmonary disease ([place]) 04/21/2012    Past Surgical History  Procedure Laterality Date  . Coronary angioplasty with stent placement      x2  . Cardiac bypass    . Cardiac surgery    . Coronary artery bypass graft    . Colonoscopy with propofol N/A 04/29/2015    Procedure: COLONOSCOPY WITH PROPOFOL;  Surgeon: Hulen Luster, MD;  Location: [facility] ENDOSCOPY;  Service: Gastroenterology;  Laterality: N/A;  . Esophagogastroduodenoscopy (egd) with propofol N/A 04/29/2015    Procedure: ESOPHAGOGASTRODUODENOSCOPY (EGD) WITH PROPOFOL;  Surgeon: Hulen Luster, MD;  Location: [facility] ENDOSCOPY;  Service: Gastroenterology;  Laterality: N/A;    Current Outpatient Rx  Name  Route  Sig  Dispense  Refill  . allopurinol (ZYLOPRIM) 300 MG tablet   Oral   Take 300 mg by mouth daily.         Marland Kitchen amLODipine (NORVASC) 10 MG tablet   Oral   Take 10 mg by mouth  daily.         . aspirin EC 81 MG tablet   Oral   Take 81 mg by mouth daily.         Marland Kitchen atorvastatin (LIPITOR) 40 MG tablet   Oral   Take 40 mg by mouth daily at 6 PM.          . Cholecalciferol (VITAMIN D3) 2000 units capsule      Take 1 capsule (2,000 Units total) by mouth daily.   30 capsule   PRN     Do not place this medication, or any other prescri ...   . gabapentin (NEURONTIN) 300 MG capsule    Oral   Take 1-3 capsules (300-900 mg total) by mouth at bedtime. Follow titration schedule.   90 capsule   0   . hydrochlorothiazide (HYDRODIURIL) 25 MG tablet   Oral   Take 25 mg by mouth daily.         Marland Kitchen losartan (COZAAR) 25 MG tablet   Oral   Take 25 mg by mouth daily.         . meloxicam (MOBIC) 15 MG tablet   Oral   Take 1 tablet (15 mg total) by mouth daily.   30 tablet   0     Do not place this medication, or any other prescri ...   . metoprolol (LOPRESSOR) 50 MG tablet   Oral   Take 50 mg by mouth 3 (three) times daily.         . Multiple Vitamins tablet   Oral   Take 1 tablet by mouth daily.          . Vitamin D, Ergocalciferol, (DRISDOL) 50000 units CAPS capsule      Take 1 capsule (50,000 Units total) by mouth 2 (two) times a week. X 6 weeks.   12 capsule   0     Do not place this medication, or any other prescri ...     Allergies Review of patient's allergies indicates no known allergies.  Family History  Problem Relation Age of Onset  . Heart disease Mother   . Hypertension Father     Social History Social History  Substance Use Topics  . Smoking status: Former Smoker -- 25 years  . Smokeless tobacco: None  . Alcohol Use: 1.2 oz/week    2 Shots of liquor per week     Comment: 14 shot per week    Review of Systems Constitutional: No fever/chills Eyes: No visual changes. ENT: No sore throat. Cardiovascular: As above Respiratory: As above Gastrointestinal: No abdominal pain.  No nausea, no vomiting.  No diarrhea.  No constipation. Genitourinary: Negative for dysuria. Musculoskeletal: Negative for back pain. Skin: Negative for rash. Neurological: Negative for headaches, focal weakness or numbness.  10-point ROS otherwise negative.  ____________________________________________   PHYSICAL EXAM:  VITAL SIGNS: ED Triage Vitals  Enc Vitals Group     BP 12/16/15 1604 120/75 mmHg     Pulse Rate 12/16/15 1604 75     Resp  12/16/15 1604 16     Temp 12/16/15 1604 98.3 F (36.8 C)     Temp Source 12/16/15 1604 Oral     SpO2 12/16/15 1604 95 %     Weight 12/16/15 1604 170 lb (77.111 kg)     Height 12/16/15 1604 5\' 3"  (1.6 m)     Head Cir --      Peak Flow --      Pain Score 12/16/15  1605 0     Pain Loc --      Pain Edu? --      Excl. in [place]? --     Constitutional: Alert and oriented. Well appearing and in no acute distress. Eyes: Conjunctivae are normal. PERRL. EOMI. Head: Atraumatic. Nose: No congestion/rhinnorhea. Mouth/Throat: Mucous membranes are moist.  Oropharynx non-erythematous. Neck: No stridor.   Cardiovascular: Normal rate, regular rhythm. Grossly normal heart sounds.  Good peripheral circulation.Chest pain is mildly reproducible to the bilateral chest anteriorly. Respiratory: Normal respiratory effort.  No retractions. Lungs CTAB. Gastrointestinal: Soft and nontender. No distention.No CVA tenderness. Musculoskeletal: No lower extremity tenderness nor edema.  No joint effusions. Neurologic:  Normal speech and language. No gross focal neurologic deficits are appreciated.  Skin:  Skin is warm, dry and intact. No rash noted. Psychiatric: Mood and affect are normal. Speech and behavior are normal.  ____________________________________________   LABS (all labs ordered are listed, but only abnormal results are displayed)  Labs Reviewed  BASIC METABOLIC PANEL - Abnormal; Notable for the following:    Sodium 131 (*)    Chloride 100 (*)    Glucose, Bld 221 (*)    BUN 37 (*)    All other components within normal limits  CBC - Abnormal; Notable for the following:    WBC 16.3 (*)    All other components within normal limits  TROPONIN I  TROPONIN I  FIBRIN DERIVATIVES D-DIMER ([facility] ONLY)   ____________________________________________  EKG  ED ECG REPORT I, Doran Stabler, the attending physician, personally viewed and interpreted this ECG.   Date: 12/16/2015  EKG Time: 1603   Rate: 80  Rhythm: normal sinus rhythm  Axis: Normal axis  Intervals:none  ST&T Change: No ST segment elevation or depression. Multiple T-wave inversions including 2, 3, aVF as well as biphasic T waves in V2 through 6. There is no significant change from EKG from 03/20/2015.  ____________________________________________  RADIOLOGY   IMPRESSION: There is no active cardiopulmonary disease. Mild chronic prominence of the interstitial markings are consistent with known COPD.   Electronically Signed By: Aissata Wilmore [place] M.D. On: 12/16/2015 16:30       ____________________________________________   PROCEDURES   ____________________________________________   INITIAL IMPRESSION / ASSESSMENT AND PLAN / ED COURSE  Pertinent labs & imaging results that were available during my care of the patient were reviewed by me and considered in my medical decision making (see chart for details).  ----------------------------------------- 6:24 PM on 12/16/2015 -----------------------------------------  Discussed case with Dr. Nehemiah Massed. The patient is a patient of Dr.Fath's. Dr. Nehemiah Massed thinks patient is appropriate follow-up in the office as long as he has 2 negative troponins and there is no return of the chest pain. Plan the patient and he understands it and is willing to comply.  ----------------------------------------- 8:10 PM on 12/16/2015 -----------------------------------------  Patient continues to be chest pain-free. We'll discharge to home. I reviewed the labs as well as the plan with the patient. He knows to return immediately for any worsening or concerning symptoms, especially pain or any shortness of breath. He also knows that he'll be following up with his cardiologist and will call in the afternoon or Monday if he does not receive a call from the office by the late morning. ____________________________________________   FINAL CLINICAL IMPRESSION(S) / ED  DIAGNOSES  Chest pain.    Orbie Pyo, MD 12/16/15 2011

## 2015-12-21 ENCOUNTER — Ambulatory Visit: Payer: Medicare Other | Admitting: Pain Medicine

## 2015-12-23 ENCOUNTER — Encounter: Payer: Self-pay | Admitting: Pain Medicine

## 2015-12-23 ENCOUNTER — Ambulatory Visit: Payer: Medicare Other | Attending: Pain Medicine | Admitting: Pain Medicine

## 2015-12-23 VITALS — BP 129/62 | HR 78 | Temp 98.2°F | Resp 16 | Ht 63.0 in | Wt 168.0 lb

## 2015-12-23 DIAGNOSIS — Z5181 Encounter for therapeutic drug level monitoring: Secondary | ICD-10-CM

## 2015-12-23 DIAGNOSIS — M533 Sacrococcygeal disorders, not elsewhere classified: Secondary | ICD-10-CM | POA: Diagnosis not present

## 2015-12-23 DIAGNOSIS — R079 Chest pain, unspecified: Secondary | ICD-10-CM | POA: Diagnosis present

## 2015-12-23 DIAGNOSIS — M47816 Spondylosis without myelopathy or radiculopathy, lumbar region: Secondary | ICD-10-CM

## 2015-12-23 DIAGNOSIS — F10239 Alcohol dependence with withdrawal, unspecified: Secondary | ICD-10-CM | POA: Insufficient documentation

## 2015-12-23 DIAGNOSIS — E559 Vitamin D deficiency, unspecified: Secondary | ICD-10-CM | POA: Insufficient documentation

## 2015-12-23 DIAGNOSIS — Z79891 Long term (current) use of opiate analgesic: Secondary | ICD-10-CM

## 2015-12-23 DIAGNOSIS — Z951 Presence of aortocoronary bypass graft: Secondary | ICD-10-CM | POA: Diagnosis not present

## 2015-12-23 DIAGNOSIS — M792 Neuralgia and neuritis, unspecified: Secondary | ICD-10-CM

## 2015-12-23 DIAGNOSIS — G8929 Other chronic pain: Secondary | ICD-10-CM

## 2015-12-23 DIAGNOSIS — M545 Low back pain, unspecified: Secondary | ICD-10-CM

## 2015-12-23 DIAGNOSIS — I252 Old myocardial infarction: Secondary | ICD-10-CM | POA: Diagnosis not present

## 2015-12-23 DIAGNOSIS — M25552 Pain in left hip: Secondary | ICD-10-CM | POA: Insufficient documentation

## 2015-12-23 DIAGNOSIS — J449 Chronic obstructive pulmonary disease, unspecified: Secondary | ICD-10-CM | POA: Diagnosis not present

## 2015-12-23 DIAGNOSIS — E785 Hyperlipidemia, unspecified: Secondary | ICD-10-CM | POA: Diagnosis not present

## 2015-12-23 DIAGNOSIS — M109 Gout, unspecified: Secondary | ICD-10-CM | POA: Diagnosis not present

## 2015-12-23 DIAGNOSIS — G4730 Sleep apnea, unspecified: Secondary | ICD-10-CM | POA: Diagnosis not present

## 2015-12-23 DIAGNOSIS — M549 Dorsalgia, unspecified: Secondary | ICD-10-CM | POA: Diagnosis present

## 2015-12-23 DIAGNOSIS — M79606 Pain in leg, unspecified: Secondary | ICD-10-CM | POA: Diagnosis present

## 2015-12-23 DIAGNOSIS — Z79899 Other long term (current) drug therapy: Secondary | ICD-10-CM | POA: Diagnosis not present

## 2015-12-23 DIAGNOSIS — Z87891 Personal history of nicotine dependence: Secondary | ICD-10-CM | POA: Diagnosis not present

## 2015-12-23 DIAGNOSIS — I2510 Atherosclerotic heart disease of native coronary artery without angina pectoris: Secondary | ICD-10-CM | POA: Insufficient documentation

## 2015-12-23 DIAGNOSIS — Z955 Presence of coronary angioplasty implant and graft: Secondary | ICD-10-CM | POA: Insufficient documentation

## 2015-12-23 DIAGNOSIS — R0789 Other chest pain: Secondary | ICD-10-CM | POA: Diagnosis not present

## 2015-12-23 DIAGNOSIS — I10 Essential (primary) hypertension: Secondary | ICD-10-CM | POA: Diagnosis not present

## 2015-12-23 MED ORDER — MELOXICAM 15 MG PO TABS: 15.0000 mg | ORAL_TABLET | Freq: Every day | ORAL | Status: DC

## 2015-12-23 MED ORDER — HYDROCODONE-ACETAMINOPHEN 5-325 MG PO TABS: 1.0000 | ORAL_TABLET | Freq: Two times a day (BID) | ORAL | Status: DC | PRN

## 2015-12-23 MED ORDER — GABAPENTIN 300 MG PO CAPS: 300.0000 mg | ORAL_CAPSULE | Freq: Every day | ORAL | Status: DC

## 2015-12-23 NOTE — Progress Notes (Signed)
Safety precautions to be maintained throughout the outpatient stay will include: orient to surroundings, keep bed in low position, maintain call bell wPatiithin reach at all times, provide assistance with transfer out of bed and ambulation. Patient did not bring any medications to the office today.

## 2015-12-23 NOTE — Patient Instructions (Signed)
Pain Management Discharge Instructions  General Discharge Instructions :  If you need to reach your doctor call: Monday-Friday 8:00 am - 4:00 pm at [phone] or toll free [phone].  After clinic hours [phone] to have operator reach doctor.  Bring all of your medication bottles to all your appointments in the pain clinic.  To cancel or reschedule your appointment with Pain Management please remember to call 24 hours in advance to avoid a fee.  Refer to the educational materials which you have been given on: General Risks, I had my Procedure. Discharge Instructions, Post Sedation.  Post Procedure Instructions:  The drugs you were given will stay in your system until tomorrow, so for the next 24 hours you should not drive, make any legal decisions or drink any alcoholic beverages.  You may eat anything you prefer, but it is better to start with liquids then soups and crackers, and gradually work up to solid foods.  Please notify your doctor immediately if you have any unusual bleeding, trouble breathing or pain that is not related to your normal pain.  Depending on the type of procedure that was done, some parts of your body may feel week and/or numb.  This usually clears up by tonight or the next day.  Walk with the use of an assistive device or accompanied by an adult for the 24 hours.  You may use ice on the affected area for the first 24 hours.  Put ice in a Ziploc bag and cover with a towel and place against area 15 minutes on 15 minutes off.  You may switch to heat after 24 hours.GENERAL RISKS AND COMPLICATIONS  What are the risk, side effects and possible complications? Generally speaking, most procedures are safe.  However, with any procedure there are risks, side effects, and the possibility of complications.  The risks and complications are dependent upon the sites that are lesioned, or the type of nerve block to be performed.  The closer the procedure is to the spine,  the more serious the risks are.  Great care is taken when placing the radio frequency needles, block needles or lesioning probes, but sometimes complications can occur. 1. Infection: Any time there is an injection through the skin, there is a risk of infection.  This is why sterile conditions are used for these blocks.  There are four possible types of infection. 1. Localized skin infection. 2. Central Nervous System Infection-This can be in the form of Meningitis, which can be deadly. 3. Epidural Infections-This can be in the form of an epidural abscess, which can cause pressure inside of the spine, causing compression of the spinal cord with subsequent paralysis. This would require an emergency surgery to decompress, and there are no guarantees that the patient would recover from the paralysis. 4. Discitis-This is an infection of the intervertebral discs.  It occurs in about 1% of discography procedures.  It is difficult to treat and it may lead to surgery.        2. Pain: the needles have to go through skin and soft tissues, will cause soreness.       3. Damage to internal structures:  The nerves to be lesioned may be near blood vessels or    other nerves which can be potentially damaged.       4. Bleeding: Bleeding is more common if the patient is taking blood thinners such as  aspirin, Coumadin, Ticiid, Plavix, etc., or if he/she have some genetic predisposition  such as   hemophilia. Bleeding into the spinal canal can cause compression of the spinal  cord with subsequent paralysis.  This would require an emergency surgery to  decompress and there are no guarantees that the patient would recover from the  paralysis.       5. Pneumothorax:  Puncturing of a lung is a possibility, every time a needle is introduced in  the area of the chest or upper back.  Pneumothorax refers to free air around the  collapsed lung(s), inside of the thoracic cavity (chest cavity).  Another two possible  complications  related to a similar event would include: Hemothorax and Chylothorax.   These are variations of the Pneumothorax, where instead of air around the collapsed  lung(s), you may have blood or chyle, respectively.       6. Spinal headaches: They may occur with any procedures in the area of the spine.       7. Persistent CSF (Cerebro-Spinal Fluid) leakage: This is a rare problem, but may occur  with prolonged intrathecal or epidural catheters either due to the formation of a fistulous  track or a dural tear.       8. Nerve damage: By working so close to the spinal cord, there is always a possibility of  nerve damage, which could be as serious as a permanent spinal cord injury with  paralysis.       9. Death:  Although rare, severe deadly allergic reactions known as "Anaphylactic  reaction" can occur to any of the medications used.      10. Worsening of the symptoms:  We can always make thing worse.  What are the chances of something like this happening? Chances of any of this occuring are extremely low.  By statistics, you have more of a chance of getting killed in a motor vehicle accident: while driving to the hospital than any of the above occurring .  Nevertheless, you should be aware that they are possibilities.  In general, it is similar to taking a shower.  Everybody knows that you can slip, hit your head and get killed.  Does that mean that you should not shower again?  Nevertheless always keep in mind that statistics do not mean anything if you happen to be on the wrong side of them.  Even if a procedure has a 1 (one) in a 1,000,000 (million) chance of going wrong, it you happen to be that one..Also, keep in mind that by statistics, you have more of a chance of having something go wrong when taking medications.  Who should not have this procedure? If you are on a blood thinning medication (e.g. Coumadin, Plavix, see list of "Blood Thinners"), or if you have an active infection going on, you should not  have the procedure.  If you are taking any blood thinners, please inform your physician.  How should I prepare for this procedure?  Do not eat or drink anything at least six hours prior to the procedure.  Bring a driver with you .  It cannot be a taxi.  Come accompanied by an adult that can drive you back, and that is strong enough to help you if your legs get weak or numb from the local anesthetic.  Take all of your medicines the morning of the procedure with just enough water to swallow them.  If you have diabetes, make sure that you are scheduled to have your procedure done first thing in the morning, whenever possible.  If you have diabetes,   take only half of your insulin dose and notify our nurse that you have done so as soon as you arrive at the clinic.  If you are diabetic, but only take blood sugar pills (oral hypoglycemic), then do not take them on the morning of your procedure.  You may take them after you have had the procedure.  Do not take aspirin or any aspirin-containing medications, at least eleven (11) days prior to the procedure.  They may prolong bleeding.  Wear loose fitting clothing that may be easy to take off and that you would not mind if it got stained with Betadine or blood.  Do not wear any jewelry or perfume  Remove any nail coloring.  It will interfere with some of our monitoring equipment.  NOTE: Remember that this is not meant to be interpreted as a complete list of all possible complications.  Unforeseen problems may occur.  BLOOD THINNERS The following drugs contain aspirin or other products, which can cause increased bleeding during surgery and should not be taken for 2 weeks prior to and 1 week after surgery.  If you should need take something for relief of minor pain, you may take acetaminophen which is found in Tylenol,m Datril, Anacin-3 and Panadol. It is not blood thinner. The products listed below are.  Do not take any of the products listed below  in addition to any listed on your instruction sheet.  A.P.C or A.P.C with Codeine Codeine Phosphate Capsules #3 Ibuprofen Ridaura  ABC compound Congesprin Imuran rimadil  Advil Cope Indocin Robaxisal  Alka-Seltzer Effervescent Pain Reliever and Antacid Coricidin or Coricidin-D  Indomethacin Rufen  Alka-Seltzer plus Cold Medicine Cosprin Ketoprofen S-A-C Tablets  Anacin Analgesic Tablets or Capsules Coumadin Korlgesic Salflex  Anacin Extra Strength Analgesic tablets or capsules CP-2 Tablets Lanoril Salicylate  Anaprox Cuprimine Capsules Levenox Salocol  Anexsia-D Dalteparin Magan Salsalate  Anodynos Darvon compound Magnesium Salicylate Sine-off  Ansaid Dasin Capsules Magsal Sodium Salicylate  Anturane Depen Capsules Marnal Soma  APF Arthritis pain formula Dewitt's Pills Measurin Stanback  Argesic Dia-Gesic Meclofenamic Sulfinpyrazone  Arthritis Bayer Timed Release Aspirin Diclofenac Meclomen Sulindac  Arthritis pain formula Anacin Dicumarol Medipren Supac  Analgesic (Safety coated) Arthralgen Diffunasal Mefanamic Suprofen  Arthritis Strength Bufferin Dihydrocodeine Mepro Compound Suprol  Arthropan liquid Dopirydamole Methcarbomol with Aspirin Synalgos  ASA tablets/Enseals Disalcid Micrainin Tagament  Ascriptin Doan's Midol Talwin  Ascriptin A/D Dolene Mobidin Tanderil  Ascriptin Extra Strength Dolobid Moblgesic Ticlid  Ascriptin with Codeine Doloprin or Doloprin with Codeine Momentum Tolectin  Asperbuf Duoprin Mono-gesic Trendar  Aspergum Duradyne Motrin or Motrin IB Triminicin  Aspirin plain, buffered or enteric coated Durasal Myochrisine Trigesic  Aspirin Suppositories Easprin Nalfon Trillsate  Aspirin with Codeine Ecotrin Regular or Extra Strength Naprosyn Uracel  Atromid-S Efficin Naproxen Ursinus  Auranofin Capsules Elmiron Neocylate Vanquish  Axotal Emagrin Norgesic Verin  Azathioprine Empirin or Empirin with Codeine Normiflo Vitamin E  Azolid Emprazil Nuprin Voltaren  Bayer  Aspirin plain, buffered or children's or timed BC Tablets or powders Encaprin Orgaran Warfarin Sodium  Buff-a-Comp Enoxaparin Orudis Zorpin  Buff-a-Comp with Codeine Equegesic Os-Cal-Gesic   Buffaprin Excedrin plain, buffered or Extra Strength Oxalid   Bufferin Arthritis Strength Feldene Oxphenbutazone   Bufferin plain or Extra Strength Feldene Capsules Oxycodone with Aspirin   Bufferin with Codeine Fenoprofen Fenoprofen Pabalate or Pabalate-SF   Buffets II Flogesic Panagesic   Buffinol plain or Extra Strength Florinal or Florinal with Codeine Panwarfarin   Buf-Tabs Flurbiprofen Penicillamine   Butalbital Compound Four-way cold tablets   Penicillin   Butazolidin Fragmin Pepto-Bismol   Carbenicillin Geminisyn Percodan   Carna Arthritis Reliever Geopen Persantine   Carprofen Gold's salt Persistin   Chloramphenicol Goody's Phenylbutazone   Chloromycetin Haltrain Piroxlcam   Clmetidine heparin Plaquenil   Cllnoril Hyco-pap Ponstel   Clofibrate Hydroxy chloroquine Propoxyphen         Before stopping any of these medications, be sure to consult the physician who ordered them.  Some, such as Coumadin (Warfarin) are ordered to prevent or treat serious conditions such as "deep thrombosis", "pumonary embolisms", and other heart problems.  The amount of time that you may need off of the medication may also vary with the medication and the reason for which you were taking it.  If you are taking any of these medications, please make sure you notify your pain physician before you undergo any procedures.         Facet Joint Block, Care After Refer to this sheet in the next few weeks. These instructions provide you with information on caring for yourself after your procedure. Your health care provider may also give you more specific instructions. Your treatment has been planned according to current medical practices, but problems sometimes occur. Call your health care provider if you have any problems  or questions after your procedure. HOME CARE INSTRUCTIONS  2. Keep track of the amount of pain relief you feel and how long it lasts. 3. Limit pain medicine within the first 4-6 hours after the procedure as directed by your health care provider. 4. Resume taking dietary supplements and medicines as directed by your health care provider. 5. You may resume your regular diet. 6. Do not apply heat near or over the injection site(s) for 24 hours.  7. Do not take a bath or soak in water (such as a pool or lake) for 24 hours. 8. Do not drive for 24 hours unless approved by your health care provider. 9. Avoid strenuous activity for 24 hours. 10. Remove your bandages the morning after the procedure.  11. If the injection site is tender, applying an ice pack may relieve some tenderness. To do this: 1. Put ice in a bag. 2. Place a towel between your skin and the bag. 3. Leave the ice on for 15-20 minutes, 3-4 times a day. 12. Keep follow-up appointments as directed by your health care provider. SEEK MEDICAL CARE IF:   Your pain is not controlled by your medicines.   There is drainage from the injection site.   There is significant bleeding or swelling at the injection site.  You have diabetes and your blood sugar is above 180 mg/dL. SEEK IMMEDIATE MEDICAL CARE IF:   You develop a fever of 101F (38.3C) or greater.   You have worsening pain or swelling around the injection site.   You have red streaking around the injection site.   You develop severe pain that is not controlled by your medicines.   You develop a headache, stiff neck, nausea, or vomiting.   Your eyes become very sensitive to light.   You have weakness, paralysis, or tingling in your arms or legs that was not present before the procedure.   You develop difficulty urinating or breathing.    This information is not intended to replace advice given to you by your health care provider. Make sure you discuss any  questions you have with your health care provider.   Document Released: 08/06/2012 Document Revised: 09/10/2014 Document Reviewed: 08/06/2012 Elsevier Interactive Patient Education 2016   [employer] Facet Joint Block The facet joints connect the bones of the spine (vertebrae). They make it possible for you to bend, twist, and make other movements with your spine. They also prevent you from overbending, overtwisting, and making other excessive movements.  A facet joint block is a procedure where a numbing medicine (anesthetic) is injected into a facet joint. Often, a type of anti-inflammatory medicine called a steroid is also injected. A facet joint block may be done for two reasons:  13. Diagnosis. A facet joint block may be done as a test to see whether neck or back pain is caused by a worn-down or infected facet joint. If the pain gets better after a facet joint block, it means the pain is probably coming from the facet joint. If the pain does not get better, it means the pain is probably not coming from the facet joint.  14. Therapy. A facet joint block may be done to relieve neck or back pain caused by a facet joint. A facet joint block is only done as a therapy if the pain does not improve with medicine, exercise programs, physical therapy, and other forms of pain management. LET YOUR HEALTH CARE PROVIDER KNOW ABOUT:   Any allergies you have.   All medicines you are taking, including vitamins, herbs, eyedrops, and over-the-counter medicines and creams.   Previous problems you or members of your family have had with the use of anesthetics.   Any blood disorders you have had.   Other health problems you have. RISKS AND COMPLICATIONS Generally, having a facet joint block is safe. However, as with any procedure, complications can occur. Possible complications associated with having a facet joint block include:   Bleeding.   Injury to a nerve near the injection site.   Pain at the  injection site.   Weakness or numbness in areas controlled by nerves near the injection site.   Infection.   Temporary fluid retention.   Allergic reaction to anesthetics or medicines used during the procedure. BEFORE THE PROCEDURE   Follow your health care provider's instructions if you are taking dietary supplements or medicines. You may need to stop taking them or reduce your dosage.   Do not take any new dietary supplements or medicines without asking your health care provider first.   Follow your health care provider's instructions about eating and drinking before the procedure. You may need to stop eating and drinking several hours before the procedure.   Arrange to have an adult drive you home after the procedure. PROCEDURE 12. You may need to remove your clothing and dress in an open-back gown so that your health care provider can access your spine.  13. The procedure will be done while you are lying on an X-ray table. Most of the time you will be asked to lie on your stomach, but you may be asked to lie in a different position if an injection will be made in your neck.  14. Special machines will be used to monitor your oxygen levels, heart rate, and blood pressure.  15. If an injection will be made in your neck, an intravenous (IV) tube will be inserted into one of your veins. Fluids and medicine will flow directly into your body through the IV tube.  16. The area over the facet joint where the injection will be made will be cleaned with an antiseptic soap. The surrounding skin will be covered with sterile drapes.  17. An anesthetic will be applied to   your skin to make the injection area numb. You may feel a temporary stinging or burning sensation.  18. A video X-ray machine will be used to locate the joint. A contrast dye may be injected into the facet joint area to help with locating the joint.  19. When the joint is located, an anesthetic medicine will be injected  into the joint through the needle.  46. Your health care provider will ask you whether you feel pain relief. If you do feel relief, a steroid may be injected to provide pain relief for a longer period of time. If you do not feel relief or feel only partial relief, additional injections of an anesthetic may be made in other facet joints.  21. The needle will be removed, the skin will be cleansed, and bandages will be applied.  AFTER THE PROCEDURE   You will be observed for 15-30 minutes before being allowed to go home. Do not drive. Have an adult drive you or take a taxi or public transportation instead.   If you feel pain relief, the pain will return in several hours or days when the anesthetic wears off.   You may feel pain relief 2-14 days after the procedure. The amount of time this relief lasts varies from person to person.   It is normal to feel some tenderness over the injected area(s) for 2 days following the procedure.   If you have diabetes, you may have a temporary increase in blood sugar.   This information is not intended to replace advice given to you by your health care provider. Make sure you discuss any questions you have with your health care provider.   Document Released: 01/09/2007 Document Revised: 09/10/2014 Document Reviewed: 06/09/2012 Elsevier Interactive Patient Education 2016 [employer]. Trigger Point Injection Trigger points are areas where you have muscle pain. A trigger point injection is a shot given in the trigger point to relieve that pain. A trigger point might feel like a knot in your muscle. It hurts to press on a trigger point. Sometimes the pain spreads out (radiates) to other parts of the body. For example, pressing on a trigger point in your shoulder might cause pain in your arm or neck. You might have one trigger point. Or, you might have more than one. People often have trigger points in their upper back and lower back. They also occur often in  the neck and shoulders. Pain from a trigger point lasts for a long time. It can make it hard to keep moving. You might not be able to do the exercise or physical therapy that could help you deal with the pain. A trigger point injection may help. It does not work for everyone. But, it may relieve your pain for a few days or a few months. A trigger point injection does not cure long-lasting (chronic) pain. LET YOUR CAREGIVER KNOW ABOUT: 15. Any allergies (especially to latex, lidocaine, or steroids). 16. Blood-thinning medicines that you take. These drugs can lead to bleeding or bruising after an injection. They include: 1. Aspirin. 2. Ibuprofen. 3. Clopidogrel. 4. Warfarin. 17. Other medicines you take. This includes all vitamins, herbs, eyedrops, over-the-counter medicines, and creams. 18. Use of steroids. 19. Recent infections. 20. Past problems with numbing medicines. 21. Bleeding problems. 22. Surgeries you have had. 23. Other health problems. RISKS AND COMPLICATIONS A trigger point injection is a safe treatment. However, problems may develop, such as:  Minor side effects usually go away in 1 to 2 days.  These may include:  Soreness.  Bruising.  Stiffness.  More serious problems are rare. But, they may include:  Bleeding under the skin (hematoma).  Skin infection.  Breaking off of the needle under your skin.  Lung puncture.  The trigger point injection may not work for you. BEFORE THE PROCEDURE You may need to stop taking any medicine that thins your blood. This is to prevent bleeding and bruising. Usually these medicines are stopped several days before the injection. No other preparation is needed. PROCEDURE  A trigger point injection can be given in your caregiver's office or in a clinic. Each injection takes 2 minutes or less.  Your caregiver will feel for trigger points. The caregiver may use a marker to circle the area for the injection.  The skin over the trigger  point will be washed with a germ-killing (antiseptic) solution.  The caregiver pinches the spot for the injection.  Then, a very thin needle is used for the shot. You may feel pain or a twitching feeling when the needle enters the trigger point.  A numbing solution may be injected into the trigger point. Sometimes a drug to keep down swelling, redness, and warmth (inflammation) is also injected.  Your caregiver moves the needle around the trigger zone until the tightness and twitching goes away.  After the injection, your caregiver may put gentle pressure over the injection site.  Then it is covered with a bandage. AFTER THE PROCEDURE  You can go right home after the injection.  The bandage can be taken off after a few hours.  You may feel sore and stiff for 1 to 2 days.  Go back to your regular activities slowly. Your caregiver may ask you to stretch your muscles. Do not do anything that takes extra energy for a few days.  Follow your caregiver's instructions to manage and treat other pain.   This information is not intended to replace advice given to you by your health care provider. Make sure you discuss any questions you have with your health care provider.   Document Released: 08/09/2011 Document Revised: 12/15/2012 Document Reviewed: 08/09/2011 Elsevier Interactive Patient Education [employer].

## 2015-12-23 NOTE — Progress Notes (Signed)
Patient's Name: [name] [name].  Patient type: Established  MRN: [id number]  Service setting: Ambulatory outpatient  DOB: 22-Oct-1955  Location: [facility]  DOS: 12/23/2015  Primary Care Physician: Elisabeth Cara, NP  Note by: Kathlen Brunswick. Dossie Arbour, M.D, DABA, DABAPM, DABPM, DABIPP, FIPP  Referring Physician: Elisabeth Cara, NP  Specialty: Board-Certified Interventional Pain Management     Primary Reason(s) for Visit: Encounter for prescription drug management (Level of risk: moderate) CC: Chest Pain; Back Pain; and Leg Pain   HPI  [name] is a 60 y.o. year old, male patient, who returns today as an established patient. He has Apnea, sleep; H/O coronary artery bypass surgery; Obstructive apnea; BP (high blood pressure); HLD (hyperlipidemia); Arteriosclerosis of coronary artery; Chronic obstructive pulmonary disease ([place]); Alcohol withdrawal syndrome ([place]); Injury of kidney; Chronic low back pain (Bilateral) (R>L); Chronic shoulder pain (Location of Tertiary source of pain) (Right); Chronic hip pain (Left); Chronic pain; History of alcoholism ([place]); Opiate use; Encounter for therapeutic drug level monitoring; Chronic chest wall pain (Location of Primary Source of Pain) (Incisional Midline) (since 04/22/2012); Chronic lower extremity pain (Location of Secondary source of pain) (Bilateral) (R>L); History of MI (myocardial infarction) (January 2014); Encounter for pain management planning; Chronic sacroiliac joint pain (Bilateral) (L>R); Neuropathic pain; Neurogenic pain; Incisional pain; Elevated sedimentation rate; Vitamin D insufficiency; Lumbar facet syndrome (Bilateral) (R>L); Lumbar spondylosis; Long term current use of opiate analgesic; and Long term prescription opiate use on his problem list.. His primarily concern today is the Chest Pain; Back Pain; and Leg Pain   Pain Assessment: Self-Reported Pain Score: 7 , clinically he looks like a 2/10. Reported level is  inconsistent with clinical obrservations Pain Type: Chronic pain Pain Location: Chest (back lower) Pain Orientation: Lower, Upper Pain Descriptors / Indicators: Aching, Constant Pain Frequency: Constant  The patient returns today for pharmacological management of his chronic pain as well as reevaluation for possible interventional therapies.  Date of Last Visit: 12/06/15 Service Provided on Last Visit: Procedure (bilateral lumbar facet)  Controlled Substance Pharmacotherapy Assessment & REMS (Risk Evaluation and Mitigation Strategy)  Analgesic: Hydrocodone/APAP 5/325 one tablet twice a day (10 mg per day) Pill Count: The patient did not bring his medications to the appointment. Final warning given with regards to this. MME/day: 10 mg/day.  Pharmacokinetics: Onset of action (Liberation/Absorption): Within expected pharmacological parameters Time to Peak effect (Distribution): Timing and results are as within normal expected parameters Duration of action (Metabolism/Excretion): Within normal limits for medication Pharmacodynamics: Analgesic Effect: More than 50% Activity Facilitation: Medication(s) allow patient to sit, stand, walk, and do the basic ADLs Perceived Effectiveness: Described as relatively effective, allowing for increase in activities of daily living (ADL) Side-effects or Adverse reactions: None reported Monitoring: [place] PMP: Online review of the past 15-month period conducted. Compliant with practice rules and regulations UDS Results/interpretation: Last UDS done on 10/25/2015 came back with no medications detected. Medication Assessment Form: Reviewed. Patient indicates being compliant with therapy Treatment compliance: Compliant Risk Assessment: Aberrant Behavior: None observed today Substance Use Disorder (SUD) Risk Level: Low Risk of opioid abuse or dependence: 0.7-3.0% with doses ? 36 MME/day and 6.1-26% with doses ? 120 MME/day. Opioid Risk Tool (ORT) Score:  3 Low  Risk for SUD (Score <3) Depression Scale Score: PHQ-2: PHQ-2 Total Score: 0 No depression (0) PHQ-9: PHQ-9 Total Score: 0 No depression (0-4)  Pharmacologic Plan: No change in therapy, at this time  Laboratory Chemistry  Inflammation Markers Lab Results  Component Value Date  ESRSEDRATE 32* 10/26/2015   CRP <0.5 10/26/2015    Renal Function Lab Results  Component Value Date   BUN 37* 12/16/2015   CREATININE 1.17 12/16/2015   GFRAA >60 12/16/2015   GFRNONAA >60 12/16/2015    Hepatic Function Lab Results  Component Value Date   AST 66* 10/26/2015   ALT 75* 10/26/2015   ALBUMIN 4.4 10/26/2015    Electrolytes Lab Results  Component Value Date   NA 131* 12/16/2015   K 3.9 12/16/2015   CL 100* 12/16/2015   CALCIUM 9.0 12/16/2015   MG 2.0 10/26/2015    Pain Modulating Vitamins Lab Results  Component Value Date   VD25OH 26.5* 10/26/2015   VD125OH2TOT 30.8 10/26/2015    Coagulation Parameters Lab Results  Component Value Date   INR 0.97 02/09/2015   LABPROT 13.1 02/09/2015    Note: I personally reviewed the above data. Results shared with patient.  Meds  The patient has a current medication list which includes the following prescription(s): allopurinol, amlodipine, aspirin ec, atorvastatin, vitamin d3, gabapentin, hydrochlorothiazide, hydrocodone-acetaminophen, losartan, meloxicam, metoprolol, multiple vitamins, and vitamin d (ergocalciferol).  Current Outpatient Prescriptions on File Prior to Visit  Medication Sig  . allopurinol (ZYLOPRIM) 300 MG tablet Take 300 mg by mouth daily.  Marland Kitchen amLODipine (NORVASC) 10 MG tablet Take 10 mg by mouth daily.  Marland Kitchen aspirin EC 81 MG tablet Take 81 mg by mouth daily.  Marland Kitchen atorvastatin (LIPITOR) 40 MG tablet Take 40 mg by mouth daily at 6 PM.   . Cholecalciferol (VITAMIN D3) 2000 units capsule Take 1 capsule (2,000 Units total) by mouth daily.  . hydrochlorothiazide (HYDRODIURIL) 25 MG tablet Take 25 mg by mouth daily.  Marland Kitchen  losartan (COZAAR) 25 MG tablet Take 25 mg by mouth daily.  . metoprolol (LOPRESSOR) 50 MG tablet Take 50 mg by mouth 3 (three) times daily.  . Multiple Vitamins tablet Take 1 tablet by mouth daily.   . Vitamin D, Ergocalciferol, (DRISDOL) 50000 units CAPS capsule Take 1 capsule (50,000 Units total) by mouth 2 (two) times a week. X 6 weeks.   No current facility-administered medications on file prior to visit.    ROS  Constitutional: Afebrile, no chills, well hydrated and well nourished Gastrointestinal: No upper or lower GI bleeding, no nausea, no vomiting and no acute GI distress Musculoskeletal: No acute joint swelling or redness, no acute loss of range of motion and no acute onset weakness Neurological: Denies any acute onset apraxia, no episodes of paralysis, no acute loss of coordination, no acute loss of consciousness and no acute onset aphasia, dysarthria, agnosia, or amnesia  Allergies  Mr. Gasper has No Known Allergies.  [place]  Medical:  Mr. Ennis  has a past medical history of Diverticulitis; Hypertension; Gout; Sleep apnea; Hyperlipidemia; Cardiomyopathy, secondary ([place]); and Coronary artery disease. Family: family history includes Heart disease in his mother; Hypertension in his father. Surgical:  has past surgical history that includes Coronary angioplasty with stent; cardiac bypass; Cardiac surgery; Coronary artery bypass graft; Colonoscopy with propofol (N/A, 04/29/2015); and Esophagogastroduodenoscopy (egd) with propofol (N/A, 04/29/2015). Tobacco:  reports that he has quit smoking. He does not have any smokeless tobacco history on file. Alcohol:  reports that he drinks about 1.2 oz of alcohol per week. Drug:  reports that he does not use illicit drugs.  Physical Examination  Constitutional Vitals: Blood pressure 129/62, pulse 78, temperature 98.2 F (36.8 C), temperature source Oral, resp. rate 16, height 5\' 3"  (1.6 m), weight 168 lb (76.204 kg),  SpO2 91 %. Calculated  BMI: Body mass index is 29.77 kg/(m^2). (25-29.9 kg/m2) Overweight - 20% higher incidence of chronic pain General appearance: alert, cooperative, oriented, in no distress, well nourished and well hydrated Eyes: PERLA Respiratory: No evidence respiratory distress, no audible rales or ronchi and no use of accessory muscles of respiration Psych: Alert, oriented to person, oriented to place and oriented to time  Cervical Spine Exam  Inspection: Normal anatomy, no anomalies observed Cervical Lordosis: Normal Alignment: Symetrical Functional ROM: Within functional limits (WFL) AROM: WFL Sensory: No sensory anomalies reported or detected  Upper Extremity Exam    Right  Left  Inspection: No gross anomalies detected  Inspection: No gross anomalies detected  Functional ROM: Limited shoulder motion   Functional ROM: Limited shoulder motion   AROM: Decreased shoulder motion   AROM: Decreased shoulder motion   Sensory: No sensory anomalies reported or detected  Sensory: No sensory anomalies reported or detected  Motor: Unremarkable  Motor: Unremarkable  Vascular: Normal skin color, temperature, and hair growth. No peripheral edema or cyanosis  Vascular: Normal skin color, temperature, and hair growth. No peripheral edema or cyanosis   Thoracic Spine  Inspection: No gross anomalies detected Alignment: Symetrical Functional ROM: Within functional limits [facility]) AROM: Adequate Palpation: WNL  Lumbar Spine  Inspection: No gross anomalies detected Alignment: Symetrical Functional ROM: Within functional limits (WFL) AROM: Decreased Sensory: No sensory anomalies reported or detected Palpation: Tender Provocative Tests: Lumbar Hyperextension and rotation test: Positive for bilateral lumbar facet pain Patrick's Maneuver: Positive for bilateral sacroiliac joint pain and left hip pain  Gait Assessment  Gait: Antalgic (limping)  Lower Extremities    Right  Left  Inspection: No gross anomalies  detected  Inspection: No gross anomalies detected  Functional ROM: Within functional limits [facility])  Functional ROM: Within functional limits (WFL)  AROM: Adequate  AROM: Adequate  Sensory: No sensory anomalies reported or detected  Sensory: No sensory anomalies reported or detected  Motor: Unremarkable  Motor: Unremarkable  Toe walk (S1): WNL  Toe walk (S1): WNL  Heal walk (L5): WNL  Heal walk (L5): WNL   Assessment & Plan  Primary Diagnosis & Pertinent Problem List: The primary encounter diagnosis was Chronic pain. Diagnoses of Encounter for therapeutic drug level monitoring, Long term current use of opiate analgesic, Long term prescription opiate use, Chronic chest wall pain (Location of Primary Source of Pain) (Incisional Midline) (since 04/22/2012), Lumbar spondylosis, unspecified spinal osteoarthritis, Neurogenic pain, Chronic low back pain (Bilateral) (R>L), and Lumbar facet syndrome (Bilateral) (R>L) were also pertinent to this visit.  Visit Diagnosis: 1. Chronic pain   2. Encounter for therapeutic drug level monitoring   3. Long term current use of opiate analgesic   4. Long term prescription opiate use   5. Chronic chest wall pain (Location of Primary Source of Pain) (Incisional Midline) (since 04/22/2012)   6. Lumbar spondylosis, unspecified spinal osteoarthritis   7. Neurogenic pain   8. Chronic low back pain (Bilateral) (R>L)   9. Lumbar facet syndrome (Bilateral) (R>L)     Problems updated and reviewed during this visit: No problems updated.  Problem-specific Plan(s): No problem-specific assessment & plan notes found for this encounter.  No new assessment & plan notes have been filed under this hospital service since the last note was generated. Service: Pain Management   Plan of Care   Problem List Items Addressed This Visit      [place]   Chronic chest wall pain (Location of Primary Source of  Pain) (Incisional Midline) (since 04/22/2012) (Chronic)   Relevant Orders    Injection tendon or ligament   Chronic low back pain (Bilateral) (R>L) (Chronic)   Relevant Medications   meloxicam (MOBIC) 15 MG tablet   HYDROcodone-acetaminophen (NORCO/VICODIN) 5-325 MG tablet   Other Relevant Orders   LUMBAR FACET(MEDIAL BRANCH NERVE BLOCK) MBNB   Chronic pain - Primary (Chronic)   Relevant Medications   meloxicam (MOBIC) 15 MG tablet   gabapentin (NEURONTIN) 300 MG capsule   HYDROcodone-acetaminophen (NORCO/VICODIN) 5-325 MG tablet   Lumbar facet syndrome (Bilateral) (R>L) (Chronic)   Relevant Medications   meloxicam (MOBIC) 15 MG tablet   HYDROcodone-acetaminophen (NORCO/VICODIN) 5-325 MG tablet   Other Relevant Orders   LUMBAR FACET(MEDIAL BRANCH NERVE BLOCK) MBNB   Lumbar spondylosis (Chronic)   Relevant Medications   meloxicam (MOBIC) 15 MG tablet   HYDROcodone-acetaminophen (NORCO/VICODIN) 5-325 MG tablet   Neurogenic pain (Chronic)   Relevant Medications   gabapentin (NEURONTIN) 300 MG capsule     Medium   Encounter for therapeutic drug level monitoring   Long term current use of opiate analgesic (Chronic)   Relevant Orders   ToxASSURE Select 13 (MW), Urine   Long term prescription opiate use (Chronic)       Pharmacotherapy (Medications Ordered): Meds ordered this encounter  Medications  . meloxicam (MOBIC) 15 MG tablet    Sig: Take 1 tablet (15 mg total) by mouth daily.    Dispense:  30 tablet    Refill:  0    Do not place this medication, or any other prescription from our practice, on "Automatic Refill". Patient may have prescription filled one day early if pharmacy is closed on scheduled refill date.  . gabapentin (NEURONTIN) 300 MG capsule    Sig: Take 1-3 capsules (300-900 mg total) by mouth at bedtime. Follow titration schedule.    Dispense:  90 capsule    Refill:  0  . HYDROcodone-acetaminophen (NORCO/VICODIN) 5-325 MG tablet    Sig: Take 1 tablet by mouth 2 (two) times daily as needed for severe pain.    Dispense:  60 tablet     Refill:  0    Do not place this medication, or any other prescription from our practice, on "Automatic Refill". Patient may have prescription filled one day early if pharmacy is closed on scheduled refill date. Do not fill until: 12/23/15 To last until: 01/22/16    [facility] & Procedure Ordered: Orders Placed This Encounter  Procedures  . Injection tendon or ligament  . LUMBAR FACET(MEDIAL BRANCH NERVE BLOCK) MBNB  . ToxASSURE Select 13 (MW), Urine    Imaging Ordered: None  Interventional Therapies: Scheduled:  Costochondral injections under fluoroscopic guidance and IV sedation.    Considering:  Diagnostic bilateral lumbar facet block under fluoroscopic guidance and IV sedation. Possible diagnostic bilateral sacroiliac joint injection under fluoroscopic guidance and IV sedation.    PRN Procedures:  None at this time.    Referral(s) or Consult(s): None at this time.  New Prescriptions   HYDROCODONE-ACETAMINOPHEN (NORCO/VICODIN) 5-325 MG TABLET    Take 1 tablet by mouth 2 (two) times daily as needed for severe pain.    Medications administered during this visit: Mr. Patchett had no medications administered during this visit.  Future Appointments Date Time Provider [place]  01/16/2016 11:20 AM Milinda Pointer, MD [facility] None  01/17/2016 10:30 AM Milinda Pointer, MD [facility] None    Primary Care Physician: Elisabeth Cara, NP Location: [facility] Note by: Kathlen Brunswick.  Dossie Arbour, M.D, DABA, DABAPM, DABPM, DABIPP, FIPP  Pain Score Disclaimer: We use the NRS-11 scale. This is a self-reported, subjective measurement of pain severity with only modest accuracy. It is used primarily to identify changes within a particular patient. It must be understood that outpatient pain scales are significantly less accurate that those used for research, where they can be applied under ideal controlled circumstances with minimal exposure to variables. In  reality, the score is likely to be a combination of pain intensity and pain affect, where pain affect describes the degree of emotional arousal or changes in action readiness caused by the sensory experience of pain. Factors such as social and work situation, setting, emotional state, anxiety levels, expectation, and prior pain experience may influence pain perception and show large inter-individual differences that may also be affected by time variables.

## 2015-12-30 LAB — TOXASSURE SELECT 13 (MW), URINE: PDF: 0

## 2016-01-03 ENCOUNTER — Ambulatory Visit: Payer: Medicare Other | Admitting: Pain Medicine

## 2016-01-08 NOTE — Progress Notes (Signed)
Quick Note:  NOTE: This forensic urine drug screen (UDS) test was conducted using a state-of-the-art ultra high performance liquid chromatography and mass spectrometry system (UPLC/MS-MS), the most sophisticated and accurate method available. UPLC/MS-MS is 1,000 times more precise and accurate than standard gas chromatography and mass spectrometry (GC/MS). This system can analyze 26 drug categories and 180 drug compounds.  The findings of this UDT were reported as abnormal due to inconsistencies with expected results. An expected substance was not present in the sample. Expectations were based on the medication history provided by the patient at the time of sample collection. These results are concerning due to the possibility of opioid diversion, or non-compliance with instructions on how to take the medication, including increase intake of medication early in the prescription refill, possibly running out towards the end of the prescribed period. ______ 

## 2016-01-16 ENCOUNTER — Ambulatory Visit: Payer: Medicare Other | Attending: Pain Medicine | Admitting: Pain Medicine

## 2016-01-16 ENCOUNTER — Encounter: Payer: Self-pay | Admitting: Pain Medicine

## 2016-01-16 VITALS — BP 138/73 | Temp 97.0°F | Resp 71 | Ht 63.0 in | Wt 170.0 lb

## 2016-01-16 DIAGNOSIS — I252 Old myocardial infarction: Secondary | ICD-10-CM | POA: Diagnosis not present

## 2016-01-16 DIAGNOSIS — I10 Essential (primary) hypertension: Secondary | ICD-10-CM | POA: Insufficient documentation

## 2016-01-16 DIAGNOSIS — R0789 Other chest pain: Secondary | ICD-10-CM

## 2016-01-16 DIAGNOSIS — Z87891 Personal history of nicotine dependence: Secondary | ICD-10-CM | POA: Diagnosis not present

## 2016-01-16 DIAGNOSIS — M109 Gout, unspecified: Secondary | ICD-10-CM | POA: Insufficient documentation

## 2016-01-16 DIAGNOSIS — G8929 Other chronic pain: Secondary | ICD-10-CM | POA: Diagnosis not present

## 2016-01-16 DIAGNOSIS — Z955 Presence of coronary angioplasty implant and graft: Secondary | ICD-10-CM | POA: Diagnosis not present

## 2016-01-16 DIAGNOSIS — Z7982 Long term (current) use of aspirin: Secondary | ICD-10-CM | POA: Diagnosis not present

## 2016-01-16 DIAGNOSIS — Z79891 Long term (current) use of opiate analgesic: Secondary | ICD-10-CM | POA: Insufficient documentation

## 2016-01-16 DIAGNOSIS — M25511 Pain in right shoulder: Secondary | ICD-10-CM | POA: Insufficient documentation

## 2016-01-16 DIAGNOSIS — G4733 Obstructive sleep apnea (adult) (pediatric): Secondary | ICD-10-CM | POA: Diagnosis not present

## 2016-01-16 DIAGNOSIS — I429 Cardiomyopathy, unspecified: Secondary | ICD-10-CM | POA: Insufficient documentation

## 2016-01-16 DIAGNOSIS — L910 Hypertrophic scar: Secondary | ICD-10-CM | POA: Diagnosis not present

## 2016-01-16 DIAGNOSIS — Z951 Presence of aortocoronary bypass graft: Secondary | ICD-10-CM | POA: Diagnosis not present

## 2016-01-16 DIAGNOSIS — R918 Other nonspecific abnormal finding of lung field: Secondary | ICD-10-CM | POA: Diagnosis not present

## 2016-01-16 DIAGNOSIS — M792 Neuralgia and neuritis, unspecified: Secondary | ICD-10-CM

## 2016-01-16 DIAGNOSIS — E785 Hyperlipidemia, unspecified: Secondary | ICD-10-CM | POA: Insufficient documentation

## 2016-01-16 DIAGNOSIS — J449 Chronic obstructive pulmonary disease, unspecified: Secondary | ICD-10-CM | POA: Diagnosis not present

## 2016-01-16 DIAGNOSIS — Z6830 Body mass index (BMI) 30.0-30.9, adult: Secondary | ICD-10-CM | POA: Diagnosis not present

## 2016-01-16 DIAGNOSIS — M940 Chondrocostal junction syndrome [Tietze]: Secondary | ICD-10-CM | POA: Insufficient documentation

## 2016-01-16 DIAGNOSIS — Z5181 Encounter for therapeutic drug level monitoring: Secondary | ICD-10-CM | POA: Diagnosis not present

## 2016-01-16 DIAGNOSIS — I2510 Atherosclerotic heart disease of native coronary artery without angina pectoris: Secondary | ICD-10-CM | POA: Insufficient documentation

## 2016-01-16 DIAGNOSIS — M545 Low back pain: Secondary | ICD-10-CM | POA: Insufficient documentation

## 2016-01-16 DIAGNOSIS — M47816 Spondylosis without myelopathy or radiculopathy, lumbar region: Secondary | ICD-10-CM | POA: Insufficient documentation

## 2016-01-16 DIAGNOSIS — M549 Dorsalgia, unspecified: Secondary | ICD-10-CM | POA: Diagnosis present

## 2016-01-16 DIAGNOSIS — M79606 Pain in leg, unspecified: Secondary | ICD-10-CM | POA: Diagnosis present

## 2016-01-16 MED ORDER — MELOXICAM 15 MG PO TABS: 15.0000 mg | ORAL_TABLET | Freq: Every day | ORAL | Status: DC

## 2016-01-16 MED ORDER — HYDROCODONE-ACETAMINOPHEN 5-325 MG PO TABS: 1.0000 | ORAL_TABLET | Freq: Two times a day (BID) | ORAL | Status: DC | PRN

## 2016-01-16 MED ORDER — GABAPENTIN 300 MG PO CAPS: 300.0000 mg | ORAL_CAPSULE | Freq: Every day | ORAL | Status: DC

## 2016-01-16 NOTE — Progress Notes (Signed)
Patient's Name: [name] [name].  Patient type: Established  MRN: [id number]  Service setting: Ambulatory outpatient  DOB: Sep 04, 1955  Location: [facility]  DOS: 01/16/2016  Primary Care Physician: Elisabeth Cara, NP  Note by: Kathlen Brunswick. Dossie Arbour, M.D, DABA, DABAPM, DABPM, DABIPP, FIPP  Referring Physician: Elisabeth Cara, NP  Specialty: Board-Certified Interventional Pain Management  Last Visit to Pain Management: 12/23/2015   Primary Reason(s) for Visit: Encounter for prescription drug management (Level of risk: moderate) CC: Back Pain and Leg Pain   HPI  [name] [name] is a 60 y.o. year old, male patient, who returns today as an established patient. He has Apnea, sleep; H/O coronary artery bypass surgery; Obstructive apnea; BP (high blood pressure); HLD (hyperlipidemia); Arteriosclerosis of coronary artery; Chronic obstructive pulmonary disease ([place]); Alcohol withdrawal syndrome ([place]); Injury of kidney; Chronic low back pain (Bilateral) (R>L); Chronic shoulder pain (Location of Tertiary source of pain) (Right); Chronic hip pain (Left); Chronic pain; History of alcoholism ([place]); Opiate use; Encounter for therapeutic drug level monitoring; Chronic chest wall pain (Location of Primary Source of Pain) (Incisional Midline) (since 04/22/2012); Chronic lower extremity pain (Location of Secondary source of pain) (Bilateral) (R>L); History of MI (myocardial infarction) (January 2014); Encounter for pain management planning; Chronic sacroiliac joint pain (Bilateral) (L>R); Neuropathic pain; Neurogenic pain; Incisional pain; Elevated sedimentation rate; Vitamin D insufficiency; Lumbar facet syndrome (Bilateral) (R>L); Lumbar spondylosis; Long term current use of opiate analgesic; Long term prescription opiate use; Keloid skin disorder; and Costochondritis on his problem list.. His primarily concern today is the Back Pain and Leg Pain   Pain Assessment: Self-Reported Pain Score: 4   Reported level is compatible with observation Pain Type: Chronic pain Pain Location: Back Pain Orientation: Lower Pain Descriptors / Indicators: Aching, Constant Pain Frequency: Constant  The patient comes into the clinics today for pharmacological management of his chronic pain. I last saw this patient on 12/23/2015. His body mass index is 30.12 kg/(m^2). The patient  reports that he does not use illicit drugs.  Date of Last Visit: 12/23/15 Service Provided on Last Visit: Med Refill  Controlled Substance Pharmacotherapy Assessment & REMS (Risk Evaluation and Mitigation Strategy)  Analgesic: Hydrocodone/APAP 5/325 one tablet twice a day (10 mg per day) Pill Count: Hydrocodone pill count # 12/60 Filled 12-23-15 MME/day: 10 mg/day.  Date of Last Visit: 12/23/15 Pharmacokinetics: Onset of action (Liberation/Absorption): Within expected pharmacological parameters Time to Peak effect (Distribution): Timing and results are as within normal expected parameters Duration of action (Metabolism/Excretion): Within normal limits for medication Pharmacodynamics: Analgesic Effect: More than 50% Activity Facilitation: Medication(s) allow patient to sit, stand, walk, and do the basic ADLs Perceived Effectiveness: Described as relatively effective, allowing for increase in activities of daily living (ADL) Side-effects or Adverse reactions: None reported Monitoring: [place] PMP: Online review of the past 32-month period conducted. Compliant with practice rules and regulations UDS Results/interpretation: The patient's last UDS was done on 12/23/2015 and it came back within normal limits with no drugs detected. We started the patient on hydrocodone on 12/23/2015. Medication Assessment Form: Reviewed. Patient indicates being compliant with therapy Treatment compliance: Compliant Risk Assessment: Aberrant Behavior: None observed today Substance Use Disorder (SUD) Risk Level: No change since last visit Risk of  opioid abuse or dependence: 0.7-3.0% with doses ? 36 MME/day and 6.1-26% with doses ? 120 MME/day. Opioid Risk Tool (ORT) Score:  3 Low Risk for SUD (Score <3) Depression Scale Score: PHQ-2: PHQ-2 Total Score: 0 No depression (0) PHQ-9: PHQ-9  Total Score: 0 No depression (0-4)  Pharmacologic Plan: No change in therapy, at this time  Laboratory Chemistry  Inflammation Markers Lab Results  Component Value Date   ESRSEDRATE 32* 10/26/2015   CRP <0.5 10/26/2015    Renal Function Lab Results  Component Value Date   BUN 37* 12/16/2015   CREATININE 1.17 12/16/2015   GFRAA >60 12/16/2015   GFRNONAA >60 12/16/2015    Hepatic Function Lab Results  Component Value Date   AST 66* 10/26/2015   ALT 75* 10/26/2015   ALBUMIN 4.4 10/26/2015    Electrolytes Lab Results  Component Value Date   NA 131* 12/16/2015   K 3.9 12/16/2015   CL 100* 12/16/2015   CALCIUM 9.0 12/16/2015   MG 2.0 10/26/2015    Pain Modulating Vitamins Lab Results  Component Value Date   VD25OH 26.5* 10/26/2015   VD125OH2TOT 30.8 10/26/2015    Coagulation Parameters Lab Results  Component Value Date   INR 0.97 02/09/2015   LABPROT 13.1 02/09/2015    Note: I personally reviewed the above data. Results made available to patient.  Recent Diagnostic Imaging  Dg Chest 2 View  12/16/2015  CLINICAL DATA:  Generalized chest pain today ; history of cardiomyopathy, coronary artery disease with stent placement and bypass, hypertension. EXAM: CHEST  2 VIEW COMPARISON:  PA and lateral chest x-ray dated March 19, 2015 FINDINGS: The lungs are adequately inflated. There is no focal infiltrate. There is no pleural effusion. The interstitial markings are mildly increased chronically. The heart is normal in size. The pulmonary vascularity is not engorged. The mediastinum is normal in width. The sternal wires are intact. There is mild multilevel degenerative disc disease of the thoracic spine. IMPRESSION: There is no active  cardiopulmonary disease. Mild chronic prominence of the interstitial markings are consistent with known COPD. Electronically Signed   By: David  Martinique M.D.   On: 12/16/2015 16:30    Meds  The patient has a current medication list which includes the following prescription(s): allopurinol, amlodipine, aspirin ec, atorvastatin, vitamin d3, gabapentin, hydrochlorothiazide, hydrocodone-acetaminophen, losartan, meloxicam, metoprolol, multiple vitamins, and vitamin d (ergocalciferol).  Current Outpatient Prescriptions on File Prior to Visit  Medication Sig  . allopurinol (ZYLOPRIM) 300 MG tablet Take 300 mg by mouth daily.  Marland Kitchen amLODipine (NORVASC) 10 MG tablet Take 10 mg by mouth daily.  Marland Kitchen aspirin EC 81 MG tablet Take 81 mg by mouth daily.  Marland Kitchen atorvastatin (LIPITOR) 40 MG tablet Take 40 mg by mouth daily at 6 PM.   . Cholecalciferol (VITAMIN D3) 2000 units capsule Take 1 capsule (2,000 Units total) by mouth daily.  . hydrochlorothiazide (HYDRODIURIL) 25 MG tablet Take 25 mg by mouth daily.  Marland Kitchen losartan (COZAAR) 25 MG tablet Take 25 mg by mouth daily.  . metoprolol (LOPRESSOR) 50 MG tablet Take 50 mg by mouth 3 (three) times daily.  . Multiple Vitamins tablet Take 1 tablet by mouth daily.   . Vitamin D, Ergocalciferol, (DRISDOL) 50000 units CAPS capsule Take 1 capsule (50,000 Units total) by mouth 2 (two) times a week. X 6 weeks.   No current facility-administered medications on file prior to visit.    ROS  Constitutional: Denies any fever or chills Gastrointestinal: No reported hemesis, hematochezia, vomiting, or acute GI distress Musculoskeletal: Denies any acute onset joint swelling, redness, loss of ROM, or weakness Neurological: No reported episodes of acute onset apraxia, aphasia, dysarthria, agnosia, amnesia, paralysis, loss of coordination, or loss of consciousness  Allergies  Mr. Degroot has No  Known Allergies.  [place]  Medical:  Mr. Fioravanti  has a past medical history of  Diverticulitis; Hypertension; Gout; Sleep apnea; Hyperlipidemia; Cardiomyopathy, secondary ([place]); and Coronary artery disease. Family: family history includes Heart disease in his mother; Hypertension in his father. Surgical:  has past surgical history that includes Coronary angioplasty with stent; cardiac bypass; Cardiac surgery; Coronary artery bypass graft; Colonoscopy with propofol (N/A, 04/29/2015); and Esophagogastroduodenoscopy (egd) with propofol (N/A, 04/29/2015). Tobacco:  reports that he has quit smoking. He does not have any smokeless tobacco history on file. Alcohol:  reports that he drinks about 1.2 oz of alcohol per week. Drug:  reports that he does not use illicit drugs.  Constitutional Exam  Vitals: Blood pressure 138/73, temperature 97 F (36.1 C), resp. rate 71, height 5\' 3"  (1.6 m), weight 170 lb (77.111 kg), SpO2 100 %. General appearance: Well nourished, well developed, and well hydrated. In no acute distress Calculated BMI/Body habitus: Body mass index is 30.12 kg/(m^2). (30-34.9 kg/m2) Obese (Class I) - 68% higher incidence of chronic pain Psych/Mental status: Alert and oriented x 3 (person, place, & time) Eyes: PERLA Respiratory: No evidence of acute respiratory distress  Cervical Spine Exam  Inspection: No masses, redness, or swelling Alignment: Symmetrical ROM: Functional: Adequate ROM Active: Unrestricted ROM Stability: No instability detected Muscle strength & Tone: Functionally intact Sensory: Unimpaired Palpation: No complaints of tenderness  Upper Extremity (UE) Exam    Side: Right upper extremity  Side: Left upper extremity  Inspection: No masses, redness, swelling, or asymmetry  Inspection: No masses, redness, swelling, or asymmetry  ROM:  ROM:  Functional: Adequate ROM  Functional: Adequate ROM  Active: Unrestricted ROM  Active: Unrestricted ROM  Muscle strength & Tone: Functionally intact  Muscle strength & Tone: Functionally intact  Sensory:  Unimpaired  Sensory: Unimpaired  Palpation: Non-contributory  Palpation: Non-contributory   Thoracic Spine Exam  Inspection: Evidence of keloid scarring on his thoracotomy incision observed. Alignment: Symmetrical ROM: Functional: Adequate ROM Active: Unrestricted ROM Stability: No instability detected Sensory: Unimpaired Muscle strength & Tone: Functionally intact Palpation: Tender over the anterior chest wall area were his thoracotomy scar is located.  Lumbar Spine Exam  Inspection: No masses, redness, or swelling Alignment: Symmetrical ROM: Functional: Adequate ROM Active: Unrestricted ROM Stability: No instability detected Muscle strength & Tone: Functionally intact Sensory: Unimpaired Palpation: No complaints of tenderness Provocative Tests: Lumbar Hyperextension and rotation test: deferred Patrick's Maneuver: deferred  Gait & Posture Assessment  Gait: Unaffected Posture: WNL  Lower Extremity Exam    Side: Right lower extremity  Side: Left lower extremity  Inspection: No masses, redness, swelling, or asymmetry ROM:  Inspection: No masses, redness, swelling, or asymmetry ROM:  Functional: Adequate ROM  Functional: Adequate ROM  Active: Unrestricted ROM  Active: Unrestricted ROM  Muscle strength & Tone: Functionally intact  Muscle strength & Tone: Functionally intact  Sensory: Unimpaired  Sensory: Unimpaired  Palpation: Non-contributory  Palpation: Non-contributory   Assessment & Plan  Primary Diagnosis & Pertinent Problem List: The primary encounter diagnosis was Chronic pain. Diagnoses of Encounter for therapeutic drug level monitoring, Long term current use of opiate analgesic, Lumbar spondylosis, unspecified spinal osteoarthritis, Neurogenic pain, Costochondritis, and Chronic chest wall pain (Location of Primary Source of Pain) (Incisional Midline) (since 04/22/2012) were also pertinent to this visit.  Visit Diagnosis: 1. Chronic pain   2. Encounter for  therapeutic drug level monitoring   3. Long term current use of opiate analgesic   4. Lumbar spondylosis, unspecified spinal osteoarthritis  5. Neurogenic pain   6. Costochondritis   7. Chronic chest wall pain (Location of Primary Source of Pain) (Incisional Midline) (since 04/22/2012)     Problems updated and reviewed during this visit: No problems updated.  Problem-specific Plan(s): No problem-specific assessment & plan notes found for this encounter.  No new assessment & plan notes have been filed under this hospital service since the last note was generated. Service: Pain Management   Plan of Care   Problem List Items Addressed This Visit      High   Chronic chest wall pain (Location of Primary Source of Pain) (Incisional Midline) (since 04/22/2012) (Chronic)   Chronic pain - Primary (Chronic)   Relevant Medications   HYDROcodone-acetaminophen (NORCO/VICODIN) 5-325 MG tablet   meloxicam (MOBIC) 15 MG tablet   gabapentin (NEURONTIN) 300 MG capsule   Costochondritis (Chronic)   Lumbar spondylosis (Chronic)   Relevant Medications   HYDROcodone-acetaminophen (NORCO/VICODIN) 5-325 MG tablet   meloxicam (MOBIC) 15 MG tablet   Neurogenic pain (Chronic)   Relevant Medications   gabapentin (NEURONTIN) 300 MG capsule     Medium   Encounter for therapeutic drug level monitoring   Long term current use of opiate analgesic (Chronic)   Relevant Orders   ToxASSURE Select 13 (MW), Urine       Pharmacotherapy (Medications Ordered): Meds ordered this encounter  Medications  . HYDROcodone-acetaminophen (NORCO/VICODIN) 5-325 MG tablet    Sig: Take 1 tablet by mouth 2 (two) times daily as needed for severe pain.    Dispense:  60 tablet    Refill:  0    Do not place this medication, or any other prescription from our practice, on "Automatic Refill". Patient may have prescription filled one day early if pharmacy is closed on scheduled refill date. Do not fill until: 01/22/16 To  last until: 02/21/16  . meloxicam (MOBIC) 15 MG tablet    Sig: Take 1 tablet (15 mg total) by mouth daily.    Dispense:  30 tablet    Refill:  0    Do not place this medication, or any other prescription from our practice, on "Automatic Refill". Patient may have prescription filled one day early if pharmacy is closed on scheduled refill date.  . gabapentin (NEURONTIN) 300 MG capsule    Sig: Take 1-3 capsules (300-900 mg total) by mouth at bedtime. Follow titration schedule.    Dispense:  90 capsule    Refill:  0    Lab-work & Procedure Ordered: Orders Placed This Encounter  Procedures  . ToxASSURE Select 13 (MW), Urine    Imaging Ordered: None  Interventional Therapies: Scheduled:  Costochondral injection under fluoroscopic guidance and IV sedation tomorrow.    Considering:  Diagnostic paravertebral nerve block.   PRN Procedures:  None at this point.    Referral(s) or Consult(s): None at this time.  New Prescriptions   No medications on file    Medications administered during this visit: Mr. Stokley had no medications administered during this visit.  Requested PM Follow-up: Return in about 4 weeks (around 02/13/2016) for Medication Management, (1-Mo).  Future Appointments Date Time Provider [place]  02/06/2016 11:20 AM Milinda Pointer, MD [facility] None  02/13/2016 11:20 AM Milinda Pointer, MD [facility] None    Primary Care Physician: Elisabeth Cara, NP Location: [facility] Note by: Kathlen Brunswick. Dossie Arbour, M.D, DABA, DABAPM, DABPM, DABIPP, FIPP  Pain Score Disclaimer: We use the NRS-11 scale. This is a self-reported, subjective measurement of pain severity with  only modest accuracy. It is used primarily to identify changes within a particular patient. It must be understood that outpatient pain scales are significantly less accurate that those used for research, where they can be applied under ideal controlled circumstances with  minimal exposure to variables. In reality, the score is likely to be a combination of pain intensity and pain affect, where pain affect describes the degree of emotional arousal or changes in action readiness caused by the sensory experience of pain. Factors such as social and work situation, setting, emotional state, anxiety levels, expectation, and prior pain experience may influence pain perception and show large inter-individual differences that may also be affected by time variables.  Patient instructions provided during this appointment: Patient Instructions  Prescription given for hydrocodone 5-325 to last until; 02-21-2016.

## 2016-01-16 NOTE — Progress Notes (Signed)
Safety precautions to be maintained throughout the outpatient stay will include: orient to surroundings, keep bed in low position, maintain call bell within reach at all times, provide assistance with transfer out of bed and ambulation. Hydrocodone pill count # 12/60  Filled 12-23-15

## 2016-01-16 NOTE — Patient Instructions (Signed)
Prescription given for hydrocodone 5-325 to last until; 02-21-2016.

## 2016-01-17 ENCOUNTER — Ambulatory Visit: Payer: Medicare Other | Attending: Pain Medicine | Admitting: Pain Medicine

## 2016-01-17 ENCOUNTER — Encounter: Payer: Self-pay | Admitting: Pain Medicine

## 2016-01-17 VITALS — BP 133/75 | HR 76 | Temp 98.0°F | Resp 8 | Ht 63.0 in | Wt 170.0 lb

## 2016-01-17 DIAGNOSIS — R700 Elevated erythrocyte sedimentation rate: Secondary | ICD-10-CM | POA: Insufficient documentation

## 2016-01-17 DIAGNOSIS — R208 Other disturbances of skin sensation: Secondary | ICD-10-CM | POA: Diagnosis not present

## 2016-01-17 DIAGNOSIS — R0789 Other chest pain: Secondary | ICD-10-CM | POA: Diagnosis not present

## 2016-01-17 DIAGNOSIS — E785 Hyperlipidemia, unspecified: Secondary | ICD-10-CM | POA: Insufficient documentation

## 2016-01-17 DIAGNOSIS — X58XXXA Exposure to other specified factors, initial encounter: Secondary | ICD-10-CM | POA: Diagnosis not present

## 2016-01-17 DIAGNOSIS — M940 Chondrocostal junction syndrome [Tietze]: Secondary | ICD-10-CM

## 2016-01-17 DIAGNOSIS — J449 Chronic obstructive pulmonary disease, unspecified: Secondary | ICD-10-CM | POA: Insufficient documentation

## 2016-01-17 DIAGNOSIS — I2510 Atherosclerotic heart disease of native coronary artery without angina pectoris: Secondary | ICD-10-CM | POA: Insufficient documentation

## 2016-01-17 DIAGNOSIS — Z951 Presence of aortocoronary bypass graft: Secondary | ICD-10-CM | POA: Insufficient documentation

## 2016-01-17 DIAGNOSIS — M545 Low back pain: Secondary | ICD-10-CM | POA: Diagnosis not present

## 2016-01-17 DIAGNOSIS — Z79891 Long term (current) use of opiate analgesic: Secondary | ICD-10-CM | POA: Insufficient documentation

## 2016-01-17 DIAGNOSIS — G8929 Other chronic pain: Secondary | ICD-10-CM | POA: Diagnosis not present

## 2016-01-17 DIAGNOSIS — M47816 Spondylosis without myelopathy or radiculopathy, lumbar region: Secondary | ICD-10-CM | POA: Insufficient documentation

## 2016-01-17 DIAGNOSIS — M25511 Pain in right shoulder: Secondary | ICD-10-CM | POA: Insufficient documentation

## 2016-01-17 DIAGNOSIS — G4733 Obstructive sleep apnea (adult) (pediatric): Secondary | ICD-10-CM | POA: Insufficient documentation

## 2016-01-17 DIAGNOSIS — S37009A Unspecified injury of unspecified kidney, initial encounter: Secondary | ICD-10-CM | POA: Insufficient documentation

## 2016-01-17 DIAGNOSIS — I10 Essential (primary) hypertension: Secondary | ICD-10-CM | POA: Diagnosis not present

## 2016-01-17 DIAGNOSIS — F10239 Alcohol dependence with withdrawal, unspecified: Secondary | ICD-10-CM | POA: Insufficient documentation

## 2016-01-17 DIAGNOSIS — M533 Sacrococcygeal disorders, not elsewhere classified: Secondary | ICD-10-CM | POA: Diagnosis not present

## 2016-01-17 DIAGNOSIS — M25552 Pain in left hip: Secondary | ICD-10-CM | POA: Diagnosis not present

## 2016-01-17 DIAGNOSIS — I252 Old myocardial infarction: Secondary | ICD-10-CM | POA: Diagnosis not present

## 2016-01-17 DIAGNOSIS — L910 Hypertrophic scar: Secondary | ICD-10-CM

## 2016-01-17 DIAGNOSIS — L7682 Other postprocedural complications of skin and subcutaneous tissue: Secondary | ICD-10-CM

## 2016-01-17 DIAGNOSIS — E559 Vitamin D deficiency, unspecified: Secondary | ICD-10-CM | POA: Insufficient documentation

## 2016-01-17 DIAGNOSIS — R079 Chest pain, unspecified: Secondary | ICD-10-CM | POA: Diagnosis present

## 2016-01-17 MED ORDER — ROPIVACAINE HCL 2 MG/ML IJ SOLN
INTRAMUSCULAR | Status: AC
  Administered 2016-01-17: 12:00:00
  Filled 2016-01-17: qty 20

## 2016-01-17 MED ORDER — TRIAMCINOLONE ACETONIDE 40 MG/ML IJ SUSP: 40.0000 mg | Freq: Once | INTRAMUSCULAR | Status: DC

## 2016-01-17 MED ORDER — TRIAMCINOLONE ACETONIDE 40 MG/ML IJ SUSP
INTRAMUSCULAR | Status: AC
  Administered 2016-01-17: 12:00:00
  Filled 2016-01-17: qty 1

## 2016-01-17 MED ORDER — ROPIVACAINE HCL 2 MG/ML IJ SOLN: 9.0000 mL | Freq: Once | INTRAMUSCULAR | Status: DC

## 2016-01-17 MED ORDER — FENTANYL CITRATE (PF) 100 MCG/2ML IJ SOLN
INTRAMUSCULAR | Status: AC
  Administered 2016-01-17: 50 ug via INTRAVENOUS
  Filled 2016-01-17: qty 2

## 2016-01-17 MED ORDER — LIDOCAINE HCL (PF) 1 % IJ SOLN: 10.0000 mL | Freq: Once | INTRAMUSCULAR | Status: DC

## 2016-01-17 MED ORDER — LACTATED RINGERS IV SOLN: 1000.0000 mL | Freq: Once | INTRAVENOUS | Status: DC

## 2016-01-17 MED ORDER — FENTANYL CITRATE (PF) 100 MCG/2ML IJ SOLN: 25.0000 ug | INTRAMUSCULAR | Status: DC | PRN

## 2016-01-17 MED ORDER — MIDAZOLAM HCL 5 MG/5ML IJ SOLN
INTRAMUSCULAR | Status: AC
  Administered 2016-01-17: 2 mg via INTRAVENOUS
  Filled 2016-01-17: qty 5

## 2016-01-17 MED ORDER — MIDAZOLAM HCL 5 MG/5ML IJ SOLN: 1.0000 mg | INTRAMUSCULAR | Status: DC | PRN

## 2016-01-17 NOTE — Progress Notes (Signed)
Safety precautions to be maintained throughout the outpatient stay will include: orient to surroundings, keep bed in low position, maintain call bell within reach at all times, provide assistance with transfer out of bed and ambulation.  

## 2016-01-17 NOTE — Patient Instructions (Signed)

## 2016-01-17 NOTE — Progress Notes (Signed)
Patient's Name: [name] [name].  Patient type: Established  MRN: [id number]  Service setting: Ambulatory outpatient  DOB: Feb 12, 1956  Location: [facility]  DOS: 01/17/2016  Primary Care Physician: Elisabeth Cara, NP  Note by: Kathlen Brunswick. Dossie Arbour, M.D, DABA, DABAPM, DABPM, DABIPP, [place]  Referring Physician: Elisabeth Cara, NP  Specialty: Board-Certified Interventional Pain Management  Last Visit to Pain Management: 01/16/2016   Primary Reason(s) for Visit: Interventional Pain Management Treatment. CC: Chest Pain  Primary Diagnosis: Chronic chest wall pain [R07.89, G89.29]   Procedure:  Anesthesia, Analgesia, Anxiolysis:  Type: Therapeutic Ligament/Tendon Insertion [id number]) Injection. Region: Chest region Level: Sternum  costochondral region Laterality: Bilateral Paramedial  Indications: 1. Chronic chest wall pain (Location of Primary Source of Pain) (Incisional Midline) (since 04/22/2012)   2. Keloid skin disorder   3. Costochondritis   4. Incisional pain     Pre-procedure Pain Score: 4/10 Reported level of pain is compatible with clinical observations Post-procedure Pain Score: 2   Type: Moderate (Conscious) Sedation & Local Anesthesia Local Anesthetic: Lidocaine 1% Route: Intravenous (IV) IV Access: Secured Sedation: Meaningful verbal contact was maintained at all times during the procedure  Indication(s): Analgesia & Anxiolysis   Pre-Procedure Assessment:  [name] [name] is a 60 y.o. year old, male patient, seen today for interventional treatment. He has Apnea, sleep; H/O coronary artery bypass surgery; Obstructive apnea; BP (high blood pressure); HLD (hyperlipidemia); Arteriosclerosis of coronary artery; Chronic obstructive pulmonary disease ([place]); Alcohol withdrawal syndrome ([place]); Injury of kidney; Chronic low back pain (Bilateral) (R>L); Chronic shoulder pain (Location of Tertiary source of pain) (Right); Chronic hip pain (Left); Chronic pain;  History of alcoholism ([place]); Opiate use; Encounter for therapeutic drug level monitoring; Chronic chest wall pain (Location of Primary Source of Pain) (Incisional Midline) (since 04/22/2012); Chronic lower extremity pain (Location of Secondary source of pain) (Bilateral) (R>L); History of MI (myocardial infarction) (January 2014); Encounter for pain management planning; Chronic sacroiliac joint pain (Bilateral) (L>R); Neuropathic pain; Neurogenic pain; Incisional pain; Elevated sedimentation rate; Vitamin D insufficiency; Lumbar facet syndrome (Bilateral) (R>L); Lumbar spondylosis; Long term current use of opiate analgesic; Long term prescription opiate use; Keloid skin disorder; and Costochondritis on his problem list.. His primarily concern today is the Chest Pain   Pain Type: Chronic pain Pain Location: Chest Pain Descriptors / Indicators: Aching, Constant Pain Frequency: Constant  Date of Last Visit: 01/16/16 Service Provided on Last Visit: Med Refill  Verification of the correct person, correct site (including marking of site), and correct procedure were performed and confirmed by the patient.  Consent: Secured. Under the influence of no sedatives a written informed consent was obtained, after having provided information on the risks and possible complications. To fulfill our ethical and legal obligations, as recommended by the American Medical Association's Code of Ethics, we have provided information to the patient about our clinical impression; the nature and purpose of the treatment or procedure; the risks, benefits, and possible complications of the intervention; alternatives; the risk(s) and benefit(s) of the alternative treatment(s) or procedure(s); and the risk(s) and benefit(s) of doing nothing. The patient was provided information about the risks and possible complications associated with the procedure. These include, but are not limited to, failure to achieve desired goals, infection,  bleeding, organ or nerve damage, allergic reactions, paralysis, and death. In addition, the patient was informed that Medicine is not an exact science; therefore, there is also the possibility of unforeseen risks and possible complications that may result in a catastrophic outcome. The  patient indicated having understood very clearly. We have given the patient no guarantees and we have made no promises. Enough time was given to the patient to ask questions, all of which were answered to the patient's satisfaction.  Consent Attestation: I, the ordering provider, attest that I have discussed with the patient the benefits, risks, side-effects, alternatives, likelihood of achieving goals, and potential problems during recovery for the procedure that I have provided informed consent.  Pre-Procedure Preparation: Safety Precautions: Allergies reviewed. Appropriate site, procedure, and patient were confirmed by following the Joint Commission's Universal Protocol (UP.01.01.01), in the form of a "Time Out". The patient was asked to confirm marked site and procedure, before commencing. The patient was asked about blood thinners, or active infections, both of which were denied. Patient was assessed for positional comfort and all pressure points were checked before starting procedure. Allergies: He has No Known Allergies.. Infection Control Precautions: Sterile technique used. Standard Universal Precautions were taken as recommended by the Department of [facility] for Disease Control and Prevention (CDC). Standard pre-surgical skin prep was conducted. Respiratory hygiene and cough etiquette was practiced. Hand hygiene observed. Safe injection practices and needle disposal techniques followed. SDV (single dose vial) medications used. Medications properly checked for expiration dates and contaminants. Personal protective equipment (PPE) used: Sterile Radiation-resistant gloves. Monitoring:  As per clinic  protocol. Filed Vitals:   01/17/16 1151 01/17/16 1159 01/17/16 1208 01/17/16 1218  BP: 139/89 136/79 130/79 133/75  Pulse: 74 77 77 76  Temp:  97.5 F (36.4 C)  98 F (36.7 C)  Resp: 12 13 13 8   Height:      Weight:      SpO2: 94% 92% 94% 95%  Calculated BMI: Body mass index is 30.12 kg/(m^2).  Description of Procedure Process:   Time-out: "Time-out" completed before starting procedure, as per protocol. Position: Supine Target Area: Anterior sternal region and costal chondral area Approach: Anterior approach. Area Prepped: Entire Anterior Chest Region Prepping solution: ChloraPrep (2% chlorhexidine gluconate and 70% isopropyl alcohol) Safety Precautions: Aspiration looking for blood return was conducted prior to all injections. At no point did we inject any substances, as a needle was being advanced. No attempts were made at seeking any paresthesias. Safe injection practices and needle disposal techniques used. Medications properly checked for expiration dates. SDV (single dose vial) medications used. Latex Allergy precautions taken.   Description of the Procedure: Protocol guidelines were followed. The patient was placed in position over the procedure table. The target area was identified and the area prepped in the usual manner. Skin & deeper tissues infiltrated with local anesthetic. Appropriate amount of time allowed to pass for local anesthetics to take effect. The procedure needle(s) was(were) then advanced to the target area. Proper needle placement secured. Negative aspiration confirmed. Solution injected in intermittent fashion, asking for systemic symptoms every 0.5cc of injectate. The needles were then removed and the area cleansed, making sure to leave some of the prepping solution back to take advantage of its long term bactericidal properties. EBL: None Materials & Medications Used:  Needle(s) Used: 22g - 1.5" Needle(s) Solution Injected: 0.2% PF-Ropivacaine (96ml) +  SDV-Triamcinolone 40mg /ml (73ml)  Imaging Guidance:  Type of Imaging Technique: Fluoroscopy Guidance (Non-spinal) Indication(s): Assistance in needle guidance and placement for procedures requiring needle placement in or near specific anatomical locations not easily accessible without such assistance. Exposure Time: Please see nurses notes. Contrast: None Fluoroscopic Guidance: I was personally present in the fluoroscopy suite, where the patient was placed in  position for the procedure, over the fluoroscopy-compatible table. Fluoroscopy was manipulated, using "Tunnel Vision Technique", to obtain the best possible view of the target area, on the affected side. Parallax error was corrected before commencing the procedure. Permanently recorded images stored by scanning into EMR. Interpretation: Intraoperative imaging interpretation by performing Physician. Adequate needle placement confirmed. Permanent hardcopy images in multiple planes scanned into the patient's record.  Antibiotic Prophylaxis:  Indication(s): No indications identified. Type:  Antibiotics Given (last 72 hours)    None       Post-operative Assessment:  Complications: No immediate post-treatment complications were observed. Disposition: The patient tolerated the entire procedure well. A repeat set of vitals were taken after the procedure and the patient was kept under observation following institutional policy, for this type of procedure. Post-procedural neurological assessment was performed, showing return to baseline, prior to discharge. The patient was provided with post-procedure discharge instructions, including a section on how to identify potential problems. Should any problems arise concerning this procedure, the patient was given instructions to immediately contact us, at any time, without hesitation. In any case, we plan to contact the patient by telephone for a follow-up status report regarding this interventional  procedure. Discharge to: Discharge home Follow-up: Return for Post-Procedure Eval (2 weeks). Comments:  No additional relevant information.  Orders Placed This Encounter  Procedures  . Injection tendon or ligament    Scheduling Instructions:     Area: Costochondral (anterior chest area)     Side: Midline     Sedation: With Sedation.     Timeframe: Today    Medications administered during this visit: We administered fentaNYL, triamcinolone acetonide, midazolam, ropivacaine (PF) 2 mg/ml (0.2%), and triamcinolone acetonide.  Prescriptions ordered during this visit: Meds ordered this encounter  Medications  . fentaNYL (SUBLIMAZE) injection 25-50 mcg    Sig:     Make sure Narcan is available in the pyxis when using this medication. In the event of respiratory depression (RR< 8/min): Titrate NARCAN (naloxone) in increments of 0.1 to 0.2 mg IV at 2-3 minute intervals, until desired degree of reversal.  . lactated ringers infusion 1,000 mL    Sig:   . midazolam (VERSED) 5 MG/5ML injection 1-2 mg    Sig:     Make sure Flumazenil is available in the pyxis when using this medication. If oversedation occurs, administer 0.2 mg IV over 15 sec. If after 45 sec no response, administer 0.2 mg again over 1 min; may repeat at 1 min intervals; not to exceed 4 doses (1 mg)  . triamcinolone acetonide (KENALOG-40) injection 40 mg    Sig:   . lidocaine (PF) (XYLOCAINE) 1 % injection 10 mL    Sig:   . ropivacaine (PF) 2 mg/ml (0.2%) (NAROPIN) epidural 9 mL    Sig:   . fentaNYL (SUBLIMAZE) 100 MCG/2ML injection    Sig:     Sharlett Iles, Delores: cabinet override  . triamcinolone acetonide (KENALOG-40) 40 MG/ML injection    Sig:     Sharlett Iles, Delores: cabinet override  . midazolam (VERSED) 5 MG/5ML injection    Sig:     Sharlett Iles, Delores: cabinet override  . ropivacaine (PF) 2 mg/ml (0.2%) (NAROPIN) 2 MG/ML epidural    Sig:     Sharlett Iles, Delores: cabinet override  . triamcinolone acetonide  (KENALOG-40) 40 MG/ML injection    Sig:     Abelardo Diesel: cabinet override    Future Appointments Date Time Provider [place]  02/06/2016 11:20 AM Milinda Pointer, MD [facility] None  02/13/2016 11:20 AM Milinda Pointer, MD [facility] None    Primary Care Physician: Elisabeth Cara, NP Location: [facility] Note by: Kathlen Brunswick. Dossie Arbour, M.D, DABA, DABAPM, DABPM, DABIPP, FIPP  Disclaimer:  Medicine is not an exact science. The only guarantee in medicine is that nothing is guaranteed. It is important to note that the decision to proceed with this intervention was based on the information collected from the patient. The Data and conclusions were drawn from the patient's questionnaire, the interview, and the physical examination. Because the information was provided in large part by the patient, it cannot be guaranteed that it has not been purposely or unconsciously manipulated. Every effort has been made to obtain as much relevant data as possible for this evaluation. It is important to note that the conclusions that lead to this procedure are derived in large part from the available data. Always take into account that the treatment will also be dependent on availability of resources and existing treatment guidelines, considered by other Pain Management Practitioners as being common knowledge and practice, at the time of the intervention. For Medico-Legal purposes, it is also important to point out that variation in procedural techniques and pharmacological choices are the acceptable norm. The indications, contraindications, technique, and results of the above procedure should only be interpreted and judged by a Board-Certified Interventional Pain Specialist with extensive familiarity and expertise in the same exact procedure and technique. Attempts at providing opinions without similar or greater experience and expertise than that of the treating physician  will be considered as inappropriate and unethical, and shall result in a formal complaint to the state medical board and applicable specialty societies.  Patient instructions provided during this appointment: Patient Instructions   GENERAL RISKS AND COMPLICATIONS  What are the risk, side effects and possible complications? Generally speaking, most procedures are safe.  However, with any procedure there are risks, side effects, and the possibility of complications.  The risks and complications are dependent upon the sites that are lesioned, or the type of nerve block to be performed.  The closer the procedure is to the spine, the more serious the risks are.  Great care is taken when placing the radio frequency needles, block needles or lesioning probes, but sometimes complications can occur. 1. Infection: Any time there is an injection through the skin, there is a risk of infection.  This is why sterile conditions are used for these blocks.  There are four possible types of infection. 1. Localized skin infection. 2. Central Nervous System Infection-This can be in the form of Meningitis, which can be deadly. 3. Epidural Infections-This can be in the form of an epidural abscess, which can cause pressure inside of the spine, causing compression of the spinal cord with subsequent paralysis. This would require an emergency surgery to decompress, and there are no guarantees that the patient would recover from the paralysis. 4. Discitis-This is an infection of the intervertebral discs.  It occurs in about 1% of discography procedures.  It is difficult to treat and it may lead to surgery.        2. Pain: the needles have to go through skin and soft tissues, will cause soreness.       3. Damage to internal structures:  The nerves to be lesioned may be near blood vessels or    other nerves which can be potentially damaged.       4. Bleeding: Bleeding is more common if the patient is taking blood  thinners such  as  aspirin, Coumadin, Ticiid, Plavix, etc., or if he/she have some genetic predisposition  such as hemophilia. Bleeding into the spinal canal can cause compression of the spinal  cord with subsequent paralysis.  This would require an emergency surgery to  decompress and there are no guarantees that the patient would recover from the  paralysis.       5. Pneumothorax:  Puncturing of a lung is a possibility, every time a needle is introduced in  the area of the chest or upper back.  Pneumothorax refers to free air around the  collapsed lung(s), inside of the thoracic cavity (chest cavity).  Another two possible  complications related to a similar event would include: Hemothorax and Chylothorax.   These are variations of the Pneumothorax, where instead of air around the collapsed  lung(s), you may have blood or chyle, respectively.       6. Spinal headaches: They may occur with any procedures in the area of the spine.       7. Persistent CSF (Cerebro-Spinal Fluid) leakage: This is a rare problem, but may occur  with prolonged intrathecal or epidural catheters either due to the formation of a fistulous  track or a dural tear.       8. Nerve damage: By working so close to the spinal cord, there is always a possibility of  nerve damage, which could be as serious as a permanent spinal cord injury with  paralysis.       9. Death:  Although rare, severe deadly allergic reactions known as "Anaphylactic  reaction" can occur to any of the medications used.      10. Worsening of the symptoms:  We can always make thing worse.  What are the chances of something like this happening? Chances of any of this occuring are extremely low.  By statistics, you have more of a chance of getting killed in a motor vehicle accident: while driving to the hospital than any of the above occurring .  Nevertheless, you should be aware that they are possibilities.  In general, it is similar to taking a shower.  Everybody knows that you can  slip, hit your head and get killed.  Does that mean that you should not shower again?  Nevertheless always keep in mind that statistics do not mean anything if you happen to be on the wrong side of them.  Even if a procedure has a 1 (one) in a 1,000,000 (million) chance of going wrong, it you happen to be that one..Also, keep in mind that by statistics, you have more of a chance of having something go wrong when taking medications.  Who should not have this procedure? If you are on a blood thinning medication (e.g. Coumadin, Plavix, see list of "Blood Thinners"), or if you have an active infection going on, you should not have the procedure.  If you are taking any blood thinners, please inform your physician.  How should I prepare for this procedure?  Do not eat or drink anything at least six hours prior to the procedure.  Bring a driver with you .  It cannot be a taxi.  Come accompanied by an adult that can drive you back, and that is strong enough to help you if your legs get weak or numb from the local anesthetic.  Take all of your medicines the morning of the procedure with just enough water to swallow them.  If you have diabetes, make sure that you  are scheduled to have your procedure done first thing in the morning, whenever possible.  If you have diabetes, take only half of your insulin dose and notify our nurse that you have done so as soon as you arrive at the clinic.  If you are diabetic, but only take blood sugar pills (oral hypoglycemic), then do not take them on the morning of your procedure.  You may take them after you have had the procedure.  Do not take aspirin or any aspirin-containing medications, at least eleven (11) days prior to the procedure.  They may prolong bleeding.  Wear loose fitting clothing that may be easy to take off and that you would not mind if it got stained with Betadine or blood.  Do not wear any jewelry or perfume  Remove any nail coloring.  It will  interfere with some of our monitoring equipment.  NOTE: Remember that this is not meant to be interpreted as a complete list of all possible complications.  Unforeseen problems may occur.  BLOOD THINNERS The following drugs contain aspirin or other products, which can cause increased bleeding during surgery and should not be taken for 2 weeks prior to and 1 week after surgery.  If you should need take something for relief of minor pain, you may take acetaminophen which is found in Tylenol,m Datril, Anacin-3 and Panadol. It is not blood thinner. The products listed below are.  Do not take any of the products listed below in addition to any listed on your instruction sheet.  A.P.C or A.P.C with Codeine Codeine Phosphate Capsules #3 Ibuprofen Ridaura  ABC compound Congesprin Imuran rimadil  Advil Cope Indocin Robaxisal  Alka-Seltzer Effervescent Pain Reliever and Antacid Coricidin or Coricidin-D  Indomethacin Rufen  Alka-Seltzer plus Cold Medicine Cosprin Ketoprofen S-A-C Tablets  Anacin Analgesic Tablets or Capsules Coumadin Korlgesic Salflex  Anacin Extra Strength Analgesic tablets or capsules CP-2 Tablets Lanoril Salicylate  Anaprox Cuprimine Capsules Levenox Salocol  Anexsia-D Dalteparin Magan Salsalate  Anodynos Darvon compound Magnesium Salicylate Sine-off  Ansaid Dasin Capsules Magsal Sodium Salicylate  Anturane Depen Capsules Marnal Soma  APF Arthritis pain formula Dewitt's Pills Measurin Stanback  Argesic Dia-Gesic Meclofenamic Sulfinpyrazone  Arthritis Bayer Timed Release Aspirin Diclofenac Meclomen Sulindac  Arthritis pain formula Anacin Dicumarol Medipren Supac  Analgesic (Safety coated) Arthralgen Diffunasal Mefanamic Suprofen  Arthritis Strength Bufferin Dihydrocodeine Mepro Compound Suprol  Arthropan liquid Dopirydamole Methcarbomol with Aspirin Synalgos  ASA tablets/Enseals Disalcid Micrainin Tagament  Ascriptin Doan's Midol Talwin  Ascriptin A/D Dolene Mobidin Tanderil   Ascriptin Extra Strength Dolobid Moblgesic Ticlid  Ascriptin with Codeine Doloprin or Doloprin with Codeine Momentum Tolectin  Asperbuf Duoprin Mono-gesic Trendar  Aspergum Duradyne Motrin or Motrin IB Triminicin  Aspirin plain, buffered or enteric coated Durasal Myochrisine Trigesic  Aspirin Suppositories Easprin Nalfon Trillsate  Aspirin with Codeine Ecotrin Regular or Extra Strength Naprosyn Uracel  Atromid-S Efficin Naproxen Ursinus  Auranofin Capsules Elmiron Neocylate Vanquish  Axotal Emagrin Norgesic Verin  Azathioprine Empirin or Empirin with Codeine Normiflo Vitamin E  Azolid Emprazil Nuprin Voltaren  Bayer Aspirin plain, buffered or children's or timed BC Tablets or powders Encaprin Orgaran Warfarin Sodium  Buff-a-Comp Enoxaparin Orudis Zorpin  Buff-a-Comp with Codeine Equegesic Os-Cal-Gesic   Buffaprin Excedrin plain, buffered or Extra Strength Oxalid   Bufferin Arthritis Strength Feldene Oxphenbutazone   Bufferin plain or Extra Strength Feldene Capsules Oxycodone with Aspirin   Bufferin with Codeine Fenoprofen Fenoprofen Pabalate or Pabalate-SF   Buffets II Flogesic Panagesic   Buffinol plain or Extra  Strength Florinal or Florinal with Codeine Panwarfarin   Buf-Tabs Flurbiprofen Penicillamine   Butalbital Compound Four-way cold tablets Penicillin   Butazolidin Fragmin Pepto-Bismol   Carbenicillin Geminisyn Percodan   Carna Arthritis Reliever Geopen Persantine   Carprofen Gold's salt Persistin   Chloramphenicol Goody's Phenylbutazone   Chloromycetin Haltrain Piroxlcam   Clmetidine heparin Plaquenil   Cllnoril Hyco-pap Ponstel   Clofibrate Hydroxy chloroquine Propoxyphen         Before stopping any of these medications, be sure to consult the physician who ordered them.  Some, such as Coumadin (Warfarin) are ordered to prevent or treat serious conditions such as "deep thrombosis", "pumonary embolisms", and other heart problems.  The amount of time that you may need off  of the medication may also vary with the medication and the reason for which you were taking it.  If you are taking any of these medications, please make sure you notify your pain physician before you undergo any procedures.

## 2016-01-18 ENCOUNTER — Telehealth: Payer: Self-pay | Admitting: *Deleted

## 2016-01-18 NOTE — Telephone Encounter (Signed)
No problems post procedure. 

## 2016-01-22 LAB — TOXASSURE SELECT 13 (MW), URINE

## 2016-01-28 ENCOUNTER — Encounter: Payer: Self-pay | Admitting: Intensive Care

## 2016-01-28 ENCOUNTER — Emergency Department: Payer: Medicare Other

## 2016-01-28 ENCOUNTER — Emergency Department
Admission: EM | Admit: 2016-01-28 | Discharge: 2016-01-28 | Disposition: A | Discharge status: Home / Self Care | Payer: Medicare Other | Attending: Emergency Medicine | Admitting: Emergency Medicine

## 2016-01-28 DIAGNOSIS — Z951 Presence of aortocoronary bypass graft: Secondary | ICD-10-CM | POA: Diagnosis not present

## 2016-01-28 DIAGNOSIS — I429 Cardiomyopathy, unspecified: Secondary | ICD-10-CM | POA: Diagnosis not present

## 2016-01-28 DIAGNOSIS — Z7982 Long term (current) use of aspirin: Secondary | ICD-10-CM | POA: Diagnosis not present

## 2016-01-28 DIAGNOSIS — R0789 Other chest pain: Secondary | ICD-10-CM | POA: Insufficient documentation

## 2016-01-28 DIAGNOSIS — I10 Essential (primary) hypertension: Secondary | ICD-10-CM | POA: Diagnosis not present

## 2016-01-28 DIAGNOSIS — J449 Chronic obstructive pulmonary disease, unspecified: Secondary | ICD-10-CM | POA: Insufficient documentation

## 2016-01-28 DIAGNOSIS — E785 Hyperlipidemia, unspecified: Secondary | ICD-10-CM | POA: Diagnosis not present

## 2016-01-28 DIAGNOSIS — Z79899 Other long term (current) drug therapy: Secondary | ICD-10-CM | POA: Diagnosis not present

## 2016-01-28 DIAGNOSIS — Z87891 Personal history of nicotine dependence: Secondary | ICD-10-CM | POA: Diagnosis not present

## 2016-01-28 DIAGNOSIS — R079 Chest pain, unspecified: Secondary | ICD-10-CM

## 2016-01-28 LAB — BASIC METABOLIC PANEL
Anion gap: 10 (ref 5–15)
BUN: 22 mg/dL — AB (ref 6–20)
CHLORIDE: 100 mmol/L — AB (ref 101–111)
CO2: 26 mmol/L (ref 22–32)
CREATININE: 0.92 mg/dL (ref 0.61–1.24)
Calcium: 9.2 mg/dL (ref 8.9–10.3)
GFR calc Af Amer: >60 mL/min (ref >60)
GFR calc non Af Amer: >60 mL/min (ref >60)
GLUCOSE: 152 mg/dL — AB (ref 65–99)
Potassium: 3.9 mmol/L (ref 3.5–5.1)
SODIUM: 136 mmol/L (ref 135–145)

## 2016-01-28 LAB — CBC
HCT: 41.3 % (ref 40.0–52.0)
Hemoglobin: 14 g/dL (ref 13.0–18.0)
MCH: 30.4 pg (ref 26.0–34.0)
MCHC: 34 g/dL (ref 32.0–36.0)
MCV: 89.6 fL (ref 80.0–100.0)
PLATELETS: 185 10*3/uL (ref 150–440)
RBC: 4.61 MIL/uL (ref 4.40–5.90)
RDW: 13.7 % (ref 11.5–14.5)
WBC: 13 10*3/uL — AB (ref 3.8–10.6)

## 2016-01-28 LAB — TROPONIN I
Troponin I: <0.03 ng/mL (ref <0.031)
Troponin I: <0.03 ng/mL (ref <0.031)

## 2016-01-28 NOTE — ED Provider Notes (Signed)
Patient is a 60 year old male with a history of coronary artery disease but also with chronic chest pain who continues to be pain-free as of 4:30 PM. Physical Exam  BP 141/99 mmHg  Pulse 94  Temp(Src) 98.4 F (36.9 C) (Oral)  Resp 17  Ht 5\' 3"  (1.6 m)  Wt 165 lb (74.844 kg)  BMI 29.24 kg/m2  SpO2 97% ----------------------------------------- 4:31 PM on 01/28/2016 -----------------------------------------   Physical Exam Resting comfortable at this time. Tolerating by mouth. ED Course  Procedures  MDM Patient with normal second troponin which was the sign out for follow-up from Dr. Kerman Passey. Patient continues to be pain-free and resting comfortably. Will be following up with his cardiologist this coming Tuesday, Dr.  Ubaldo Glassing.  Patient understands the plan and is willing to comply. Will be discharged home.      Orbie Pyo, MD 01/28/16 [id number]

## 2016-01-28 NOTE — ED Provider Notes (Signed)
[facility] Emergency Department Provider Note  Time seen: 12:16 PM  I have reviewed the triage vital signs and the nursing notes.   HISTORY  Chief Complaint Chest Pain    HPI [name] [name]. is a 60 y.o. male with a past medical history of hypertension, hyperlipidemia, CAD status post CABG, status post cardiac stent, presents to the emergency department for chest pain. According to the patient around 11:00 this morning he experienced sharp central to left-sided chest pain. States he took 2 nitroglycerin tablets, upon arrival to the emergency department he states his pain is completely gone. He does state mild shortness of breath with the chest pain this morning, denies any diaphoresis or nausea. Patient denies any abdominal pain, recent cough or congestion, or fever. States he feels well currently. Has no complaints at this time.     Past Medical History  Diagnosis Date  . Diverticulitis   . Hypertension   . Gout   . Sleep apnea   . Hyperlipidemia   . Cardiomyopathy, secondary ([place])   . Coronary artery disease     Patient Active Problem List   Diagnosis Date Noted  . Keloid skin disorder 01/17/2016  . Costochondritis 01/17/2016  . Long term current use of opiate analgesic 12/23/2015  . Long term prescription opiate use 12/23/2015  . Vitamin D insufficiency 11/21/2015  . Lumbar facet syndrome (Bilateral) (R>L) 11/21/2015  . Lumbar spondylosis 11/21/2015  . Elevated sedimentation rate 11/01/2015  . Chronic low back pain (Bilateral) (R>L) 10/25/2015  . Chronic shoulder pain (Location of Tertiary source of pain) (Right) 10/25/2015  . Chronic hip pain (Left) 10/25/2015  . Chronic pain 10/25/2015  . History of alcoholism ([place]) 10/25/2015  . Opiate use 10/25/2015  . Encounter for therapeutic drug level monitoring 10/25/2015  . Chronic chest wall pain (Location of Primary Source of Pain) (Incisional Midline) (since 04/22/2012) 10/25/2015  . Chronic  lower extremity pain (Location of Secondary source of pain) (Bilateral) (R>L) 10/25/2015  . History of MI (myocardial infarction) (January 2014) 10/25/2015  . Encounter for pain management planning 10/25/2015  . Chronic sacroiliac joint pain (Bilateral) (L>R) 10/25/2015  . Neuropathic pain 10/25/2015  . Neurogenic pain 10/25/2015  . Incisional pain 10/25/2015  . HLD (hyperlipidemia) 03/16/2014  . H/O coronary artery bypass surgery 05/12/2012  . Alcohol withdrawal syndrome ([place]) 04/28/2012  . Injury of kidney 04/25/2012  . Obstructive apnea 04/22/2012  . BP (high blood pressure) 04/22/2012  . Arteriosclerosis of coronary artery 04/22/2012  . Apnea, sleep 04/21/2012  . Chronic obstructive pulmonary disease ([place]) 04/21/2012    Past Surgical History  Procedure Laterality Date  . Coronary angioplasty with stent placement      x2  . Cardiac bypass    . Cardiac surgery    . Coronary artery bypass graft    . Colonoscopy with propofol N/A 04/29/2015    Procedure: COLONOSCOPY WITH PROPOFOL;  Surgeon: Hulen Luster, MD;  Location: [facility] ENDOSCOPY;  Service: Gastroenterology;  Laterality: N/A;  . Esophagogastroduodenoscopy (egd) with propofol N/A 04/29/2015    Procedure: ESOPHAGOGASTRODUODENOSCOPY (EGD) WITH PROPOFOL;  Surgeon: Hulen Luster, MD;  Location: [facility] ENDOSCOPY;  Service: Gastroenterology;  Laterality: N/A;    Current Outpatient Rx  Name  Route  Sig  Dispense  Refill  . allopurinol (ZYLOPRIM) 300 MG tablet   Oral   Take 300 mg by mouth daily.         Marland Kitchen amLODipine (NORVASC) 10 MG tablet   Oral   Take  10 mg by mouth daily.         Marland Kitchen aspirin EC 81 MG tablet   Oral   Take 81 mg by mouth daily.         Marland Kitchen atorvastatin (LIPITOR) 40 MG tablet   Oral   Take 40 mg by mouth daily at 6 PM.          . Cholecalciferol (VITAMIN D3) 2000 units capsule      Take 1 capsule (2,000 Units total) by mouth daily.   30 capsule   PRN     Do not place this medication, or any other prescri  ...   . gabapentin (NEURONTIN) 300 MG capsule   Oral   Take 1-3 capsules (300-900 mg total) by mouth at bedtime. Follow titration schedule.   90 capsule   0   . hydrochlorothiazide (HYDRODIURIL) 25 MG tablet   Oral   Take 25 mg by mouth daily.         Marland Kitchen HYDROcodone-acetaminophen (NORCO/VICODIN) 5-325 MG tablet   Oral   Take 1 tablet by mouth 2 (two) times daily as needed for severe pain.   60 tablet   0     Do not place this medication, or any other prescri ...   . losartan (COZAAR) 25 MG tablet   Oral   Take 25 mg by mouth daily.         . meloxicam (MOBIC) 15 MG tablet   Oral   Take 1 tablet (15 mg total) by mouth daily.   30 tablet   0     Do not place this medication, or any other prescri ...   . metoprolol (LOPRESSOR) 50 MG tablet   Oral   Take 50 mg by mouth 3 (three) times daily.         . Multiple Vitamins tablet   Oral   Take 1 tablet by mouth daily.          . Vitamin D, Ergocalciferol, (DRISDOL) 50000 units CAPS capsule      Take 1 capsule (50,000 Units total) by mouth 2 (two) times a week. X 6 weeks.   12 capsule   0     Do not place this medication, or any other prescri ...     Allergies Review of patient's allergies indicates no known allergies.  Family History  Problem Relation Age of Onset  . Heart disease Mother   . Hypertension Father     Social History Social History  Substance Use Topics  . Smoking status: Former Smoker -- 25 years  . Smokeless tobacco: None  . Alcohol Use: 1.2 oz/week    2 Shots of liquor per week     Comment: 14 shot per week    Review of Systems Constitutional: Negative for fever. Cardiovascular: Positive for sharp chest pain which has now resolved. Respiratory: Negative for shortness of breath. Gastrointestinal: Negative for abdominal pain, vomiting and diarrhea. Musculoskeletal: Negative for back pain. Negative for leg pain or swelling. Neurological: Negative for headache 10-point ROS  otherwise negative.  ____________________________________________   PHYSICAL EXAM:  VITAL SIGNS: ED Triage Vitals  Enc Vitals Group     BP 01/28/16 1200 172/90 mmHg     Pulse Rate 01/28/16 1202 86     Resp 01/28/16 1202 16     Temp 01/28/16 1202 98.4 F (36.9 C)     Temp Source 01/28/16 1202 Oral     SpO2 01/28/16 1202 98 %  Weight 01/28/16 1202 165 lb (74.844 kg)     Height 01/28/16 1202 5\' 3"  (1.6 m)     Head Cir --      Peak Flow --      Pain Score --      Pain Loc --      Pain Edu? --      Excl. in [place]? --     Constitutional: Alert and oriented. Well appearing and in no distress. Eyes: Normal exam ENT   Head: Normocephalic and atraumatic.   Mouth/Throat: Mucous membranes are moist. Cardiovascular: Normal rate, regular rhythm. No murmur Respiratory: Normal respiratory effort without tachypnea nor retractions. Breath sounds are clear Gastrointestinal: Soft and nontender. No distention. Musculoskeletal: Nontender with normal range of motion in all extremities. No edema. Neurologic:  Normal speech and language. No gross focal neurologic deficits Skin:  Skin is warm and intact. Dry skin. Psychiatric: Mood and affect are normal.   ____________________________________________    EKG  EKG reviewed and interpreted by myself shows normal sinus rhythm at 89 bpm, narrow QRS, normal axis, normal intervals, no concerning ST changes identified.  ____________________________________________    RADIOLOGY  Chest x-ray negative  ____________________________________________   INITIAL IMPRESSION / ASSESSMENT AND PLAN / ED COURSE  Pertinent labs & imaging results that were available during my care of the patient were reviewed by me and considered in my medical decision making (see chart for details).  The patient presents the emergency department with chest pain which is now resolved. Patient took 2 nitroglycerin tablets at home prior to arrival in the emergency  department. Currently the patient appears well, denies any symptoms, denies any chest pain. He did state mild shortness breath earlier today with chest pain but denies any nausea or diaphoresis at any point.  Labs are within normal limits. Patient remains chest pain-free in the emergency department. Chest x-ray negative. First troponin is negative. Patient is asking for something to eat. We will provide the patient food, obtaining a second troponin at 3:30 PM. If negative anticipate discharge home with cardiology follow-up. I discussed this plan of care with the patient who is agreeable.  Patient care signed out to oncoming physician.  ____________________________________________   FINAL CLINICAL IMPRESSION(S) / ED DIAGNOSES  Chest pain   Harvest Dark, MD 01/28/16 1528

## 2016-01-28 NOTE — ED Notes (Signed)
Pt back from chest x ray.

## 2016-01-28 NOTE — Discharge Instructions (Signed)
You have been seen in the emergency department today for chest pain. Your workup has shown normal results. As we discussed please follow-up with your primary care physician in the next 1-2 days for recheck. Return to the emergency department for any further chest pain, trouble breathing, or any other symptom personally concerning to yourself. ° °Nonspecific Chest Pain °It is often hard to find the cause of chest pain. There is always a chance that your pain could be related to something serious, such as a heart attack or a blood clot in your lungs. Chest pain can also be caused by conditions that are not life-threatening. If you have chest pain, it is very important to follow up with your doctor. ° °HOME CARE °· If you were prescribed an antibiotic medicine, finish it all even if you start to feel better. °· Avoid any activities that cause chest pain. °· Do not use any tobacco products, including cigarettes, chewing tobacco, or electronic cigarettes. If you need help quitting, ask your doctor. °· Do not drink alcohol. °· Take medicines only as told by your doctor. °· Keep all follow-up visits as told by your doctor. This is important. This includes any further testing if your chest pain does not go away. °· Your doctor may tell you to keep your head raised (elevated) while you sleep. °· Make lifestyle changes as told by your doctor. These may include: °¨ Getting regular exercise. Ask your doctor to suggest some activities that are safe for you. °¨ Eating a heart-healthy diet. Your doctor or a diet specialist (dietitian) can help you to learn healthy eating options. °¨ Maintaining a healthy weight. °¨ Managing diabetes, if necessary. °¨ Reducing stress. °GET HELP IF: °· Your chest pain does not go away, even after treatment. °· You have a rash with blisters on your chest. °· You have a fever. °GET HELP RIGHT AWAY IF: °· Your chest pain is worse. °· You have an increasing cough, or you cough up blood. °· You have  severe belly (abdominal) pain. °· You feel extremely weak. °· You pass out (faint). °· You have chills. °· You have sudden, unexplained chest discomfort. °· You have sudden, unexplained discomfort in your arms, back, neck, or jaw. °· You have shortness of breath at any time. °· You suddenly start to sweat, or your skin gets clammy. °· You feel nauseous. °· You vomit. °· You suddenly feel light-headed or dizzy. °· Your heart begins to beat quickly, or it feels like it is skipping beats. °These symptoms may be an emergency. Do not wait to see if the symptoms will go away. Get medical help right away. Call your local emergency services (911 in the [place] Do not drive yourself to the hospital. °  °This information is not intended to replace advice given to you by your health care provider. Make sure you discuss any questions you have with your health care provider. °  °Document Released: 02/06/2008 Document Revised: 09/10/2014 Document Reviewed: 03/26/2014 °Elsevier Interactive Patient Education ©2016 [employer] ° °

## 2016-01-28 NOTE — ED Notes (Signed)
Patient arrived by EMS from home. Patient called EMS due to feeling sharp chest pain around 11:00am. PAtient has HX of previous MI's. Patient denies feeling any chest pain right now. Patient A&O X4

## 2016-01-28 NOTE — ED Notes (Signed)
Patient transported to X-ray 

## 2016-02-01 ENCOUNTER — Other Ambulatory Visit: Payer: Self-pay | Admitting: Cardiology

## 2016-02-03 ENCOUNTER — Ambulatory Visit
Admission: RE | Admit: 2016-02-03 | Discharge: 2016-02-03 | Disposition: A | Discharge status: Home / Self Care | Payer: Medicare Other | Source: Ambulatory Visit | Attending: Cardiology | Admitting: Cardiology

## 2016-02-03 ENCOUNTER — Encounter
Admission: RE | Disposition: A | Discharge status: Home / Self Care | Payer: Self-pay | Source: Ambulatory Visit | Attending: Cardiology

## 2016-02-03 ENCOUNTER — Encounter: Payer: Self-pay | Admitting: *Deleted

## 2016-02-03 DIAGNOSIS — Z951 Presence of aortocoronary bypass graft: Secondary | ICD-10-CM | POA: Diagnosis not present

## 2016-02-03 DIAGNOSIS — E7800 Pure hypercholesterolemia, unspecified: Secondary | ICD-10-CM | POA: Insufficient documentation

## 2016-02-03 DIAGNOSIS — K644 Residual hemorrhoidal skin tags: Secondary | ICD-10-CM | POA: Diagnosis not present

## 2016-02-03 DIAGNOSIS — R079 Chest pain, unspecified: Secondary | ICD-10-CM | POA: Diagnosis present

## 2016-02-03 DIAGNOSIS — Z833 Family history of diabetes mellitus: Secondary | ICD-10-CM | POA: Diagnosis not present

## 2016-02-03 DIAGNOSIS — R0789 Other chest pain: Secondary | ICD-10-CM

## 2016-02-03 DIAGNOSIS — I25110 Atherosclerotic heart disease of native coronary artery with unstable angina pectoris: Secondary | ICD-10-CM | POA: Insufficient documentation

## 2016-02-03 DIAGNOSIS — G4733 Obstructive sleep apnea (adult) (pediatric): Secondary | ICD-10-CM | POA: Insufficient documentation

## 2016-02-03 DIAGNOSIS — I429 Cardiomyopathy, unspecified: Secondary | ICD-10-CM | POA: Insufficient documentation

## 2016-02-03 DIAGNOSIS — Z9861 Coronary angioplasty status: Secondary | ICD-10-CM | POA: Insufficient documentation

## 2016-02-03 DIAGNOSIS — Z87891 Personal history of nicotine dependence: Secondary | ICD-10-CM | POA: Diagnosis not present

## 2016-02-03 DIAGNOSIS — I10 Essential (primary) hypertension: Secondary | ICD-10-CM | POA: Diagnosis not present

## 2016-02-03 DIAGNOSIS — Z8249 Family history of ischemic heart disease and other diseases of the circulatory system: Secondary | ICD-10-CM | POA: Diagnosis not present

## 2016-02-03 DIAGNOSIS — Z7982 Long term (current) use of aspirin: Secondary | ICD-10-CM | POA: Insufficient documentation

## 2016-02-03 DIAGNOSIS — I25700 Atherosclerosis of coronary artery bypass graft(s), unspecified, with unstable angina pectoris: Secondary | ICD-10-CM | POA: Diagnosis not present

## 2016-02-03 DIAGNOSIS — I259 Chronic ischemic heart disease, unspecified: Secondary | ICD-10-CM | POA: Insufficient documentation

## 2016-02-03 DIAGNOSIS — G8929 Other chronic pain: Secondary | ICD-10-CM

## 2016-02-03 HISTORY — DX: Unspecified hemorrhoids: K64.9

## 2016-02-03 HISTORY — DX: Benign neoplasm of colon, unspecified: D12.6

## 2016-02-03 HISTORY — PX: CARDIAC CATHETERIZATION: SHX172

## 2016-02-03 HISTORY — DX: Esophageal obstruction: K22.2

## 2016-02-03 SURGERY — LEFT HEART CATH AND CORONARY ANGIOGRAPHY
Anesthesia: Moderate Sedation | Laterality: Left

## 2016-02-03 MED ORDER — SODIUM CHLORIDE 0.9 % WEIGHT BASED INFUSION: 3.0000 mL/kg/h | INTRAVENOUS | Status: DC

## 2016-02-03 MED ORDER — FENTANYL CITRATE (PF) 100 MCG/2ML IJ SOLN
INTRAMUSCULAR | Status: AC
  Filled 2016-02-03: qty 2

## 2016-02-03 MED ORDER — MIDAZOLAM HCL 2 MG/2ML IJ SOLN
INTRAMUSCULAR | Status: AC
  Filled 2016-02-03: qty 2

## 2016-02-03 MED ORDER — SODIUM CHLORIDE 0.9 % IV SOLN: 250.0000 mL | INTRAVENOUS | Status: DC | PRN

## 2016-02-03 MED ORDER — SODIUM CHLORIDE 0.9 % WEIGHT BASED INFUSION: 1.0000 mL/kg/h | INTRAVENOUS | Status: DC

## 2016-02-03 MED ORDER — SODIUM CHLORIDE 0.9% FLUSH: 3.0000 mL | INTRAVENOUS | Status: DC | PRN

## 2016-02-03 MED ORDER — SODIUM CHLORIDE 0.9% FLUSH: 3.0000 mL | Freq: Two times a day (BID) | INTRAVENOUS | Status: DC

## 2016-02-03 MED ORDER — ASPIRIN 81 MG PO CHEW: 81.0000 mg | CHEWABLE_TABLET | ORAL | Status: DC

## 2016-02-03 MED ORDER — HEPARIN (PORCINE) IN NACL 2-0.9 UNIT/ML-% IJ SOLN
INTRAMUSCULAR | Status: AC
  Filled 2016-02-03: qty 500

## 2016-02-03 MED ORDER — FENTANYL CITRATE (PF) 100 MCG/2ML IJ SOLN
INTRAMUSCULAR | Status: DC | PRN
  Administered 2016-02-03 (×2): 25 ug via INTRAVENOUS

## 2016-02-03 MED ORDER — MIDAZOLAM HCL 2 MG/2ML IJ SOLN
INTRAMUSCULAR | Status: DC | PRN
  Administered 2016-02-03 (×2): 1 mg via INTRAVENOUS

## 2016-02-03 SURGICAL SUPPLY — 13 items
CATH INFINITI 5 FR 3DRC (CATHETERS) ×3 IMPLANT
CATH INFINITI 5 FR MPA2 (CATHETERS) ×3 IMPLANT
CATH INFINITI 5 FR RCB (CATHETERS) ×3 IMPLANT
CATH INFINITI 5FR AL3 (CATHETERS) ×3 IMPLANT
CATH INFINITI 5FR ANG PIGTAIL (CATHETERS) ×3 IMPLANT
CATH INFINITI 5FR JL4 (CATHETERS) ×3 IMPLANT
CATH INFINITI JR4 5F (CATHETERS) ×3 IMPLANT
DEVICE CLOSURE MYNXGRIP 5F (Vascular Products) ×3 IMPLANT
KIT MANI 3VAL PERCEP (MISCELLANEOUS) ×3 IMPLANT
NEEDLE PERC 18GX7CM (NEEDLE) ×3 IMPLANT
PACK CARDIAC CATH (CUSTOM PROCEDURE TRAY) ×3 IMPLANT
SHEATH AVANTI 5FR X 11CM (SHEATH) ×3 IMPLANT
WIRE EMERALD 3MM-J .035X150CM (WIRE) ×3 IMPLANT

## 2016-02-03 NOTE — Discharge Instructions (Signed)

## 2016-02-03 NOTE — H&P (Addendum)
Chief Complaint: Chief Complaint  Patient presents with  . Hospital Follow Up  chest pain,htn--- all better now  Date of Service: 02/01/2016 Date of Birth: 04-27-1956 PCP: [facility]  History of Present Illness: [name] [name] is a 60 y.o.male patient who has a history of Coronary disease status post Coronary artery bypass grafting. He is being seen with regard to chest pain that occurred recently. This prompted him to present to the emergency room his workup was unremarkable. Electrocardiogram showed no change from baseline. Pain was on both sides of his chest. He continues to have recurrent chest pain. Functional study done recently was unremarkable for significant reversibility however he has had another visit to the emergency room. EKG and workup was negative however the pain persists. We will need to proceed with left heart cath to evaluate for evidence of significant progression of his coronary disease given to the fact that he has recurrent chest pain similar to his angina both with rest and exertional consistent with unstable angina. Risk and benefits of cardiac catheterization were explained. Past Medical and Surgical History  Past Medical History Past Medical History:  Diagnosis Date  . Benign esophageal stricture 04/29/2015  . Cardiomyopathy, secondary (CMS-HCC)  . Coronary artery disease  . Diverticulosis 04/29/2015  . External hemorrhoids 04/29/2015  . Hyperlipidemia, unspecified  . Hypertension  . Sleep apnea  . Tubular adenoma of colon, unspecified 04/29/2015   Past Surgical History He has a past surgical history that includes coronary artery bypass w/venous & arterial grafts (N/A, 04/22/2012); harvest left internal mammary artery for cabg (Right, 04/22/2012); transesophageal echocardiography (N/A, [id number]); application wound vac (N/A, 04/22/2012); Septoplasty; Cardiac catheterization; Coronary angioplasty; Coronary artery bypass graft; Colonoscopy (04/29/2015); and egd (04/29/2015).    Medications and Allergies  Current Medications  Current Outpatient Prescriptions  Medication Sig Dispense Refill  . allopurinol (ZYLOPRIM) 300 MG tablet Take 300 mg by mouth once daily.  Marland Kitchen amLODIPine (NORVASC) 10 MG tablet Take 10 mg by mouth once daily.  Marland Kitchen aspirin 81 MG chewable tablet Take 81 mg by mouth daily.  Marland Kitchen atorvastatin (LIPITOR) 40 MG tablet Take 40 mg by mouth once daily.  . cholecalciferol (VITAMIN D3) 1,000 unit capsule Take 1,000 Units by mouth once daily.  . hydrochlorothiazide (HYDRODIURIL) 25 MG tablet Take 25 mg by mouth once daily.  Marland Kitchen ibuprofen (ADVIL,MOTRIN) 800 MG tablet Take 800 mg by mouth every 8 (eight) hours as needed for Pain.  Marland Kitchen losartan (COZAAR) 25 MG tablet Take 25 mg by mouth once daily.  . metoprolol tartrate (LOPRESSOR) 50 MG tablet Take 50 mg by mouth 3 (three) times daily.  . multivitamin (MULTIVITAMIN) tablet Take 1 tablet by mouth daily.  Marland Kitchen omeprazole (PRILOSEC) 40 MG DR capsule Take 1 capsule (40 mg total) by mouth once daily. 30 capsule 10  . peg-electrolyte (NULYTELY) solution Take as directed for colonic prep. 4000 mL 0  . traMADol (ULTRAM) 50 mg tablet Take by mouth.   No current facility-administered medications for this visit.   Allergies: Review of patient's allergies indicates no known allergies.  Social and Family History  Social History reports that he quit smoking about 3 years ago. He has a 40.00 pack-year smoking history. He does not have any smokeless tobacco history on file. He reports that he drinks about 8.4 oz of alcohol per week   Family History Family History  Problem Relation Age of Onset  . Diabetes type II Other  . Hypertension Other  . Heart failure Mother  . Heart failure  Father   Review of Systems  Review of Systems  Constitutional: Negative for chills, diaphoresis, fever, malaise/fatigue and weight loss.  HENT: Negative for congestion, ear discharge, hearing loss and tinnitus.  Eyes: Negative for blurred  vision.  Respiratory: Negative for cough, hemoptysis, sputum production, shortness of breath and wheezing.  Cardiovascular: Positive for chest pain. Negative for palpitations, orthopnea, claudication, leg swelling and PND.  Gastrointestinal: Negative for abdominal pain, blood in stool, constipation, diarrhea, heartburn, melena, nausea and vomiting.  Genitourinary: Negative for dysuria, frequency, hematuria and urgency.  Musculoskeletal: Negative for back pain, falls, joint pain and myalgias.  Skin: Negative for itching and rash.  Neurological: Negative for dizziness, tingling, focal weakness, loss of consciousness, weakness and headaches.  Endo/Heme/Allergies: Negative for polydipsia. Does not bruise/bleed easily.  Psychiatric/Behavioral: Negative for depression, memory loss and substance abuse. The patient is not nervous/anxious.    Physical Examination   Vitals: BP 130/84  Pulse 84  Resp 12  Ht 160 cm (5\' 3" )  Wt 74.8 kg (165 lb)  BMI 29.23 kg/m2 Ht:160 cm (5\' 3" ) Wt:74.8 kg (165 lb) [id number] surface area is 1.82 meters squared. Body mass index is 29.23 kg/(m^2).  Wt Readings from Last 3 Encounters:  02/01/16 74.8 kg (165 lb)  12/21/15 76.7 kg (169 lb)  05/31/15 78.7 kg (173 lb 6.4 oz)   BP Readings from Last 3 Encounters:  02/01/16 130/84  12/21/15 (!) 140/92  05/31/15 118/72   General: male in no acute distress LUNGS Breath Sounds: Normal Percussion: Normal  CARDIOVASCULAR JVP CV wave: no HJR: no Elevation at 90 degrees: None Carotid Pulse: normal pulsation bilaterally Bruit: None Apex: apical impulse normal  Auscultation Rhythm: normal sinus rhythm S1: normal S2: normal Clicks: no Rub: no Murmurs: no murmurs  Gallop: None ABDOMEN Liver enlargement: no Pulsatile aorta: no Ascites: no Bruits: no  EXTREMITIES Clubbing: no Edema: trace to 1+ bilateral pedal edema Pulses: peripheral pulses symmetrical Femoral Bruits: no Amputation: no SKIN Rash:  no Cyanosis: no Embolic phemonenon: no Bruising: no NEURO Alert and Oriented to person, place and time: yes Non focal: yes LABS Last 3 CBC results: Lab Results  Component Value Date  WBC 7.5 03/15/2015  WBC 5.3 05/12/2012  WBC 12.3 (H) 04/28/2012   Lab Results  Component Value Date  HGB 14.8 03/15/2015  HGB 11.9 (L) 05/12/2012  HGB 10.5 (L) 04/28/2012   Lab Results  Component Value Date  HCT 42.8 03/15/2015  HCT 0.35 (L) 05/12/2012  HCT 0.31 (L) 04/28/2012   Lab Results  Component Value Date  PLT 196 03/15/2015  PLT 369 05/12/2012  PLT 159 04/28/2012   Lab Results  Component Value Date  CREATININE 1.0 03/15/2015  BUN 18 03/15/2015  NA 135 (L) 03/15/2015  K 3.6 03/15/2015  CL 99 03/15/2015  CO2 28.4 03/15/2015   Lab Results  Component Value Date  HGBA1C 6.2 (H) 04/21/2012   Lab Results  Component Value Date  ALT 59 (H) 03/15/2015  AST 49 (H) 03/15/2015  ALKPHOS 88 03/15/2015     Assessment and Plan   60 y.o. male with  ICD-10-CM ICD-9-CM  1. Coronary artery disease involving coronary bypass graft of native heart with angina pectoris-patient was seen in the emergency room with chest pain ruled out for myocardial infarction. Pain was similar to what he had prior to his CABG. functional study was unremarkable however will need to proceed with left cardiac catheterization given persistent and recurrent episodes of chest pain relieved with nitro similar to his angina. This  is consistent with unstable angina. Risk and benefits of the cardiac cath were explained to the patient he agrees to proceed I25.709 414.05  413.9  2. Pure hypercholesterolemia-Will continue with atorvastatin at 10 mg daily. LDL goal of less than 100. E78.0 272.0  3. Essential hypertension-blood pressure is currently controlled with amlodipine, hydrochlorothiazide and metoprolol. Will continue with this regimen and dash diet I10 401.9  4. OSA (obstructive sleep apnea)-weight loss and CPAP  consideration. G47.33 327.23   Return in about 1 week (around 02/08/2016).  These notes generated with voice recognition software. I apologize for typographical errors.  Sydnee Levans, MD   Pt is doing well and all is unchanged from above h and p.  Pt had a on 04/22/12 CABG X 4 (Free LIMA-LAD, SVG-PDA, SVG-OM2, SVG-Ramus)  Further recs after cath.

## 2016-02-06 ENCOUNTER — Ambulatory Visit: Payer: Medicare Other | Attending: Pain Medicine | Admitting: Pain Medicine

## 2016-02-06 ENCOUNTER — Encounter: Payer: Self-pay | Admitting: Pain Medicine

## 2016-02-06 VITALS — BP 146/79 | HR 77 | Temp 98.0°F | Resp 18 | Ht 63.0 in | Wt 165.0 lb

## 2016-02-06 DIAGNOSIS — M47816 Spondylosis without myelopathy or radiculopathy, lumbar region: Secondary | ICD-10-CM | POA: Diagnosis not present

## 2016-02-06 DIAGNOSIS — G4733 Obstructive sleep apnea (adult) (pediatric): Secondary | ICD-10-CM | POA: Diagnosis not present

## 2016-02-06 DIAGNOSIS — E785 Hyperlipidemia, unspecified: Secondary | ICD-10-CM | POA: Diagnosis not present

## 2016-02-06 DIAGNOSIS — M25511 Pain in right shoulder: Secondary | ICD-10-CM | POA: Insufficient documentation

## 2016-02-06 DIAGNOSIS — X58XXXA Exposure to other specified factors, initial encounter: Secondary | ICD-10-CM | POA: Diagnosis not present

## 2016-02-06 DIAGNOSIS — Z79891 Long term (current) use of opiate analgesic: Secondary | ICD-10-CM | POA: Diagnosis not present

## 2016-02-06 DIAGNOSIS — R700 Elevated erythrocyte sedimentation rate: Secondary | ICD-10-CM | POA: Insufficient documentation

## 2016-02-06 DIAGNOSIS — I2510 Atherosclerotic heart disease of native coronary artery without angina pectoris: Secondary | ICD-10-CM | POA: Diagnosis not present

## 2016-02-06 DIAGNOSIS — R0789 Other chest pain: Secondary | ICD-10-CM | POA: Diagnosis not present

## 2016-02-06 DIAGNOSIS — M545 Low back pain: Secondary | ICD-10-CM

## 2016-02-06 DIAGNOSIS — L910 Hypertrophic scar: Secondary | ICD-10-CM | POA: Diagnosis not present

## 2016-02-06 DIAGNOSIS — S37009A Unspecified injury of unspecified kidney, initial encounter: Secondary | ICD-10-CM | POA: Diagnosis not present

## 2016-02-06 DIAGNOSIS — G8929 Other chronic pain: Secondary | ICD-10-CM | POA: Diagnosis not present

## 2016-02-06 DIAGNOSIS — M25552 Pain in left hip: Secondary | ICD-10-CM | POA: Diagnosis not present

## 2016-02-06 DIAGNOSIS — I252 Old myocardial infarction: Secondary | ICD-10-CM | POA: Insufficient documentation

## 2016-02-06 DIAGNOSIS — M940 Chondrocostal junction syndrome [Tietze]: Secondary | ICD-10-CM | POA: Diagnosis not present

## 2016-02-06 DIAGNOSIS — E559 Vitamin D deficiency, unspecified: Secondary | ICD-10-CM | POA: Diagnosis not present

## 2016-02-06 DIAGNOSIS — J449 Chronic obstructive pulmonary disease, unspecified: Secondary | ICD-10-CM | POA: Diagnosis not present

## 2016-02-06 DIAGNOSIS — R079 Chest pain, unspecified: Secondary | ICD-10-CM | POA: Diagnosis present

## 2016-02-06 DIAGNOSIS — Z87891 Personal history of nicotine dependence: Secondary | ICD-10-CM | POA: Diagnosis not present

## 2016-02-06 DIAGNOSIS — F10239 Alcohol dependence with withdrawal, unspecified: Secondary | ICD-10-CM | POA: Diagnosis not present

## 2016-02-06 DIAGNOSIS — M533 Sacrococcygeal disorders, not elsewhere classified: Secondary | ICD-10-CM | POA: Diagnosis not present

## 2016-02-06 DIAGNOSIS — Z7982 Long term (current) use of aspirin: Secondary | ICD-10-CM | POA: Insufficient documentation

## 2016-02-06 DIAGNOSIS — M79606 Pain in leg, unspecified: Secondary | ICD-10-CM | POA: Diagnosis present

## 2016-02-06 DIAGNOSIS — Z955 Presence of coronary angioplasty implant and graft: Secondary | ICD-10-CM | POA: Insufficient documentation

## 2016-02-06 DIAGNOSIS — Z951 Presence of aortocoronary bypass graft: Secondary | ICD-10-CM | POA: Diagnosis not present

## 2016-02-06 MED ORDER — GABAPENTIN 300 MG PO CAPS: 300.0000 mg | ORAL_CAPSULE | Freq: Four times a day (QID) | ORAL | Status: DC

## 2016-02-06 MED ORDER — HYDROCODONE-ACETAMINOPHEN 5-325 MG PO TABS: 1.0000 | ORAL_TABLET | Freq: Two times a day (BID) | ORAL | Status: DC | PRN

## 2016-02-06 NOTE — Patient Instructions (Signed)
Facet Blocks Patient Information  Description: The facets are joints in the spine between the vertebrae.  Like any joints in the body, facets can become irritated and painful.  Arthritis can also effect the facets.  By injecting steroids and local anesthetic in and around these joints, we can temporarily block the nerve supply to them.  Steroids act directly on irritated nerves and tissues to reduce selling and inflammation which often leads to decreased pain.  Facet blocks may be done anywhere along the spine from the neck to the low back depending upon the location of your pain.   After numbing the skin with local anesthetic (like Novocaine), a small needle is passed onto the facet joints under x-ray guidance.  You may experience a sensation of pressure while this is being done.  The entire block usually lasts about 15-25 minutes.   Conditions which may be treated by facet blocks:   Low back/buttock pain  Neck/shoulder pain  Certain types of headaches  Preparation for the injection:  1. Do not eat any solid food or dairy products within 8 hours of your appointment. 2. You may drink clear liquid up to 3 hours before appointment.  Clear liquids include water, black coffee, juice or soda.  No milk or cream please. 3. You may take your regular medication, including pain medications, with a sip of water before your appointment.  Diabetics should hold regular insulin (if taken separately) and take 1/2 normal NPH dose the morning of the procedure.  Carry some sugar containing items with you to your appointment. 4. A driver must accompany you and be prepared to drive you home after your procedure. 5. Bring all your current medications with you. 6. An IV may be inserted and sedation may be given at the discretion of the physician. 7. A blood pressure cuff, EKG and other monitors will often be applied during the procedure.  Some patients may need to have extra oxygen administered for a short  period. 8. You will be asked to provide medical information, including your allergies and medications, prior to the procedure.  We must know immediately if you are taking blood thinners (like Coumadin/Warfarin) or if you are allergic to IV iodine contrast (dye).  We must know if you could possible be pregnant.  Possible side-effects:   Bleeding from needle site  Infection (rare, may require surgery)  Nerve injury (rare)  Numbness & tingling (temporary)  Difficulty urinating (rare, temporary)  Spinal headache (a headache worse with upright posture)  Light-headedness (temporary)  Pain at injection site (serveral days)  Decreased blood pressure (rare, temporary)  Weakness in arm/leg (temporary)  Pressure sensation in back/neck (temporary)   Call if you experience:   Fever/chills associated with headache or increased back/neck pain  Headache worsened by an upright position  New onset, weakness or numbness of an extremity below the injection site  Hives or difficulty breathing (go to the emergency room)  Inflammation or drainage at the injection site(s)  Severe back/neck pain greater than usual  New symptoms which are concerning to you  Please note:  Although the local anesthetic injected can often make your back or neck feel good for several hours after the injection, the pain will likely return. It takes 3-7 days for steroids to work.  You may not notice any pain relief for at least one week.  If effective, we will often do a series of 2-3 injections spaced 3-6 weeks apart to maximally decrease your pain.  After the initial   series, you may be a candidate for a more permanent nerve block of the facets.  If you have any questions, please [phone] [facility] 

## 2016-02-06 NOTE — Progress Notes (Signed)
Safety precautions to be maintained throughout the outpatient stay will include: orient to surroundings, keep bed in low position, maintain call bell within reach at all times, provide assistance with transfer out of bed and ambulation.  

## 2016-02-06 NOTE — Progress Notes (Signed)
Patient's Name: [name] [name].  Patient type: Established  MRN: [id number]  Service setting: Ambulatory outpatient  DOB: [birth year]  Location: [facility]  DOS: 02/06/2016  Primary Care Physician: Elisabeth Cara, NP  Note by: Kathlen Brunswick. Dossie Arbour, M.D, DABA, DABAPM, DABPM, DABIPP, FIPP  Referring Physician: Elisabeth Cara, NP  Specialty: Board-Certified Interventional Pain Management  Last Visit to Pain Management: 01/18/2016   Primary Reason(s) for Visit: Encounter for prescription drug management & post-procedure evaluation of chronic illness with mild to moderate exacerbation(Level of risk: moderate) CC: Leg Pain and Chest Pain   HPI  [name] [name] is a 60 y.o. year old, male patient, who returns today as an established patient. He has Apnea, sleep; H/O coronary artery bypass surgery; Obstructive apnea; BP (high blood pressure); HLD (hyperlipidemia); Arteriosclerosis of coronary artery; Chronic obstructive pulmonary disease ([place]); Alcohol withdrawal syndrome ([place]); Injury of kidney; Chronic low back pain (Bilateral) (R>L); Chronic shoulder pain (Location of Tertiary source of pain) (Right); Chronic hip pain (Left); Chronic pain; History of alcoholism ([place]); Opiate use; Encounter for therapeutic drug level monitoring; Chronic chest wall pain (Location of Primary Source of Pain) (Incisional Midline) (since 04/22/2012); Chronic lower extremity pain (Location of Secondary source of pain) (Bilateral) (R>L); History of MI (myocardial infarction) (January 2014); Encounter for pain management planning; Chronic sacroiliac joint pain (Bilateral) (L>R); Neuropathic pain; Neurogenic pain; Incisional pain; Elevated sedimentation rate; Vitamin D insufficiency; Lumbar facet syndrome (Bilateral) (R>L); Lumbar spondylosis; Long term current use of opiate analgesic; Long term prescription opiate use; Keloid skin disorder; and Costochondritis on his problem list.. His primarily concern today  is the Leg Pain and Chest Pain   Pain Assessment: Self-Reported Pain Score: 3  Reported level is compatible with observation Pain Type: Chronic pain Pain Location: Chest Pain Orientation:  (sternum) Pain Descriptors / Indicators: Sharp, Pressure Pain Frequency: Constant  The patient comes into the clinics today for post-procedure evaluation on the interventional treatment done on 01/17/2016. In addition, he comes in today for pharmacological management of his chronic pain.  The patient  reports that he does not use illicit drugs.  Date of Last Visit: 01/17/16 Service Provided on Last Visit: Procedure (bilateral costachondral injection)  Controlled Substance Pharmacotherapy Assessment & REMS (Risk Evaluation and Mitigation Strategy)  Analgesic: Hydrocodone/APAP 5/325 one tablet twice a day (10 mg per day) Pill Count: Did not bring pills with him today. MME/day: 10 mg/day.  Pharmacokinetics: Onset of action (Liberation/Absorption): Within expected pharmacological parameters Time to Peak effect (Distribution): Timing and results are as within normal expected parameters Duration of action (Metabolism/Excretion): Within normal limits for medication Pharmacodynamics: Analgesic Effect: More than 50% Activity Facilitation: Medication(s) allow patient to sit, stand, walk, and do the basic ADLs Perceived Effectiveness: Described as relatively effective, allowing for increase in activities of daily living (ADL) Side-effects or Adverse reactions: None reported Monitoring: [place] PMP: Online review of the past 26-month period conducted. Compliant with practice rules and regulations Last UDS on record: TOXASSURE SELECT 13  Date Value Ref Range Status  01/16/2016 FINAL  Final    Comment:    ==================================================================== TOXASSURE SELECT 13 (MW) ==================================================================== Test                             Result        Flag       Units Drug Present and Declared for Prescription Verification   Hydrocodone  700          EXPECTED   ng/mg creat   Hydromorphone                  168          EXPECTED   ng/mg creat   Dihydrocodeine                 43           EXPECTED   ng/mg creat   Norhydrocodone                 271          EXPECTED   ng/mg creat    Sources of hydrocodone include scheduled prescription    medications. Hydromorphone, dihydrocodeine and norhydrocodone are    expected metabolites of hydrocodone. Hydromorphone and    dihydrocodeine are also available as scheduled prescription    medications. ==================================================================== Test                      Result    Flag   Units      Ref Range   Creatinine              120              mg/dL      >=20 ==================================================================== Declared Medications:  The flagging and interpretation on this report are based on the  following declared medications.  Unexpected results may arise from  inaccuracies in the declared medications.  **Note: The testing scope of this panel includes these medications:  Hydrocodone (Hydrocodone-Acetaminophen)  **Note: The testing scope of this panel does not include following  reported medications:  Acetaminophen (Hydrocodone-Acetaminophen)  Allopurinol  Amlodipine  Aspirin  Atorvastatin  Cholecalciferol  Gabapentin  Hydrochlorothiazide  Losartan (Losartan Potassium)  Meloxicam  Metoprolol  Multivitamin  Vitamin D2 (Ergocalciferol) ==================================================================== For clinical consultation, please call [phone]. ====================================================================    UDS interpretation: Compliant Medication Assessment Form: Reviewed. Patient indicates being compliant with therapy Treatment compliance: Compliant Risk Assessment: Aberrant Behavior: None observed  today Substance Use Disorder (SUD) Risk Level: Low Risk of opioid abuse or dependence: 0.7-3.0% with doses ? 36 MME/day and 6.1-26% with doses ? 120 MME/day. Opioid Risk Tool (ORT) Score: Total Score: 0 Low Risk for SUD (Score <3) Depression Scale Score: PHQ-2: PHQ-2 Total Score: 0 No depression (0) PHQ-9: PHQ-9 Total Score: 0 No depression (0-4)  Pharmacologic Plan: No change in therapy, at this time  Post-Procedure Assessment  Procedure done on last visit: Diagnostic costochondral injection over the prior thoracotomy surgical scar. Side-effects or Adverse reactions: None reported Sedation: Please see nurses note  Results: Ultra-Short Term Relief (First 1 hour after procedure): 10 %  No analgesic relief from Local Anesthetics would suggest pain etiology to reside elsewhere Short Term Relief (Initial 4-6 hrs after procedure): 10 % No analgesic effect could suggest the source of pain to be elsewhere Long Term Relief : 0 % No benefit could suggest etiology to be non-inflammatory, possibly compressive   Current Relief (Now): 0%  No persistent benefit would suggest no long-term anti-inflammatory benefit from intervention and/or persistent or recurrent aggravating factors Interpretation of Results: Based on these results, I would not be worth it to repeat this.  Laboratory Chemistry  Inflammation Markers Lab Results  Component Value Date   ESRSEDRATE 32* 10/26/2015   CRP <0.5 10/26/2015    Renal Function Lab Results  Component Value Date   BUN  22* 01/28/2016   CREATININE 0.92 01/28/2016   GFRAA >60 01/28/2016   GFRNONAA >60 01/28/2016    Hepatic Function Lab Results  Component Value Date   AST 66* 10/26/2015   ALT 75* 10/26/2015   ALBUMIN 4.4 10/26/2015    Electrolytes Lab Results  Component Value Date   NA 136 01/28/2016   K 3.9 01/28/2016   CL 100* 01/28/2016   CALCIUM 9.2 01/28/2016   MG 2.0 10/26/2015    Pain Modulating Vitamins Lab Results  Component  Value Date   VD25OH 26.5* 10/26/2015   VD125OH2TOT 30.8 10/26/2015    Coagulation Parameters Lab Results  Component Value Date   INR 0.97 02/09/2015   LABPROT 13.1 02/09/2015   APTT > 160.0* 04/21/2012   PLT 185 01/28/2016    Note: Labs reviewed. Results made available to patient  Recent Diagnostic Imaging  No results found.  Meds  The patient has a current medication list which includes the following prescription(s): allopurinol, amlodipine, aspirin ec, atorvastatin, vitamin d3, gabapentin, hydrochlorothiazide, hydrocodone-acetaminophen, hydrocodone-acetaminophen, hydrocodone-acetaminophen, ibuprofen, losartan, meloxicam, metoprolol, multiple vitamins, vitamin d (ergocalciferol), and gabapentin.  Current Outpatient Prescriptions on File Prior to Visit  Medication Sig  . allopurinol (ZYLOPRIM) 300 MG tablet Take 300 mg by mouth daily.  Marland Kitchen amLODipine (NORVASC) 10 MG tablet Take 10 mg by mouth daily.  Marland Kitchen aspirin EC 81 MG tablet Take 81 mg by mouth daily.  Marland Kitchen atorvastatin (LIPITOR) 40 MG tablet Take 40 mg by mouth daily at 6 PM.   . Cholecalciferol (VITAMIN D3) 2000 units capsule Take 1 capsule (2,000 Units total) by mouth daily.  Marland Kitchen gabapentin (NEURONTIN) 300 MG capsule Take 900 mg by mouth at bedtime.  . hydrochlorothiazide (HYDRODIURIL) 25 MG tablet Take 25 mg by mouth daily.  Marland Kitchen losartan (COZAAR) 25 MG tablet Take 25 mg by mouth daily.  . meloxicam (MOBIC) 15 MG tablet Take 1 tablet (15 mg total) by mouth daily.  . metoprolol (LOPRESSOR) 50 MG tablet Take 50 mg by mouth 3 (three) times daily.  . Multiple Vitamins tablet Take 1 tablet by mouth daily.   . Vitamin D, Ergocalciferol, (DRISDOL) 50000 units CAPS capsule Take 1 capsule (50,000 Units total) by mouth 2 (two) times a week. X 6 weeks.   No current facility-administered medications on file prior to visit.    ROS  Constitutional: Denies any fever or chills Gastrointestinal: No reported hemesis, hematochezia, vomiting, or  acute GI distress Musculoskeletal: Denies any acute onset joint swelling, redness, loss of ROM, or weakness Neurological: No reported episodes of acute onset apraxia, aphasia, dysarthria, agnosia, amnesia, paralysis, loss of coordination, or loss of consciousness  Allergies  [name] [name] has No Known Allergies.  [place]  Medical:  [name] [name]  has a past medical history of Diverticulitis; Hypertension; Gout; Sleep apnea; Hyperlipidemia; Cardiomyopathy, secondary ([place]); Coronary artery disease; Hemorrhoids; Benign esophageal stricture; and Tubular adenoma of colon. Family: family history includes Heart disease in his mother; Hypertension in his father. Surgical:  has past surgical history that includes Coronary angioplasty with stent; cardiac bypass; Cardiac surgery; Coronary artery bypass graft; Colonoscopy with propofol (N/A, 04/29/2015); Esophagogastroduodenoscopy (egd) with propofol (N/A, 04/29/2015); Septoplasty; and Cardiac catheterization (Left, 02/03/2016). Tobacco:  reports that he has quit smoking. He does not have any smokeless tobacco history on file. Alcohol:  reports that he drinks about 8.4 oz of alcohol per week. Drug:  reports that he does not use illicit drugs.  Constitutional Exam  Vitals: Blood pressure 146/79, pulse 77, temperature 98 F (36.7  C), resp. rate 18, height 5\' 3"  (1.6 m), weight 165 lb (74.844 kg), SpO2 97 %. General appearance: Well nourished, well developed, and well hydrated. In no acute distress Calculated BMI/Body habitus: Body mass index is 29.24 kg/(m^2). (25-29.9 kg/m2) Overweight - 20% higher incidence of chronic pain Psych/Mental status: Alert and oriented x 3 (person, place, & time) Eyes: PERLA Respiratory: No evidence of acute respiratory distress  Cervical Spine Exam  Inspection: No masses, redness, or swelling Alignment: Symmetrical ROM: Functional: ROM is within functional limits [facility]) Stability: No instability detected Muscle strength & Tone:  Functionally intact Sensory: Unimpaired Palpation: No complaints of tenderness  Upper Extremity (UE) Exam    Side: Right upper extremity  Side: Left upper extremity  Inspection: No masses, redness, swelling, or asymmetry  Inspection: No masses, redness, swelling, or asymmetry  ROM:  ROM:  Functional: ROM is within functional limits [facility])  Functional: ROM is within functional limits [facility])  Muscle strength & Tone: Functionally intact  Muscle strength & Tone: Functionally intact  Sensory: Unimpaired  Sensory: Unimpaired  Palpation: Non-contributory  Palpation: Non-contributory   Thoracic Spine Exam  Inspection: No masses, redness, or swelling Alignment: Symmetrical ROM: Functional: ROM is within functional limits [facility]) Stability: No instability detected Sensory: Unimpaired Muscle strength & Tone: Functionally intact Palpation: No complaints of tenderness  Lumbar Spine Exam  Inspection: No masses, redness, or swelling Alignment: Symmetrical ROM: Functional: Decreased ROM Stability: No instability detected Muscle strength & Tone: Functionally intact Sensory: Unimpaired Palpation: Tender Provocative Tests: Lumbar Hyperextension and rotation test: Positive for bilateral lumbar facet pain. Patrick's Maneuver: Positive for bilateral sacroiliac joint pain and left hip pain  Gait & Posture Assessment  Ambulation: Unassisted Gait: Unaffected Posture: WNL  Lower Extremity Exam    Side: Right lower extremity  Side: Left lower extremity  Inspection: No masses, redness, swelling, or asymmetry ROM:  Inspection: No masses, redness, swelling, or asymmetry ROM:  Functional: ROM is within functional limits [facility])  Functional: Decreased ROM for the hip joint.   Muscle strength & Tone: Functionally intact  Muscle strength & Tone: Functionally intact  Sensory: Unimpaired  Sensory: Unimpaired  Palpation: Non-contributory  Palpation: Non-contributory   Assessment & Plan  Primary Diagnosis &  Pertinent Problem List: The primary encounter diagnosis was Chronic pain. Diagnoses of Lumbar facet syndrome (Bilateral) (R>L), Long term current use of opiate analgesic, Chronic low back pain (Bilateral) (R>L), Lumbar spondylosis, unspecified spinal osteoarthritis, Chronic hip pain (Left), and Chronic sacroiliac joint pain (Bilateral) (L>R) were also pertinent to this visit.  Visit Diagnosis: 1. Chronic pain   2. Lumbar facet syndrome (Bilateral) (R>L)   3. Long term current use of opiate analgesic   4. Chronic low back pain (Bilateral) (R>L)   5. Lumbar spondylosis, unspecified spinal osteoarthritis   6. Chronic hip pain (Left)   7. Chronic sacroiliac joint pain (Bilateral) (L>R)     Problems updated and reviewed during this visit: No problems updated.  Problem-specific Plan(s): No problem-specific assessment & plan notes found for this encounter.  No new assessment & plan notes have been filed under this hospital service since the last note was generated. Service: Pain Management   Plan of Care   Problem List Items Addressed This Visit      High   Chronic hip pain (Left) (Chronic)   Relevant Orders   HIP INJECTION   Chronic low back pain (Bilateral) (R>L) (Chronic)   Relevant Medications   ibuprofen (ADVIL,MOTRIN) 800 MG tablet   HYDROcodone-acetaminophen (NORCO/VICODIN) 5-325 MG  tablet   HYDROcodone-acetaminophen (NORCO/VICODIN) 5-325 MG tablet   HYDROcodone-acetaminophen (NORCO/VICODIN) 5-325 MG tablet   Other Relevant Orders   LUMBAR FACET(MEDIAL BRANCH NERVE BLOCK) MBNB   Chronic pain - Primary (Chronic)   Relevant Medications   ibuprofen (ADVIL,MOTRIN) 800 MG tablet   HYDROcodone-acetaminophen (NORCO/VICODIN) 5-325 MG tablet   HYDROcodone-acetaminophen (NORCO/VICODIN) 5-325 MG tablet   HYDROcodone-acetaminophen (NORCO/VICODIN) 5-325 MG tablet   gabapentin (NEURONTIN) 300 MG capsule   Chronic sacroiliac joint pain (Bilateral) (L>R) (Chronic)   Relevant  Medications   ibuprofen (ADVIL,MOTRIN) 800 MG tablet   HYDROcodone-acetaminophen (NORCO/VICODIN) 5-325 MG tablet   HYDROcodone-acetaminophen (NORCO/VICODIN) 5-325 MG tablet   HYDROcodone-acetaminophen (NORCO/VICODIN) 5-325 MG tablet   Other Relevant Orders   SACROILIAC JOINT INJECTINS   Lumbar facet syndrome (Bilateral) (R>L) (Chronic)   Relevant Medications   ibuprofen (ADVIL,MOTRIN) 800 MG tablet   HYDROcodone-acetaminophen (NORCO/VICODIN) 5-325 MG tablet   HYDROcodone-acetaminophen (NORCO/VICODIN) 5-325 MG tablet   HYDROcodone-acetaminophen (NORCO/VICODIN) 5-325 MG tablet   Other Relevant Orders   LUMBAR FACET(MEDIAL BRANCH NERVE BLOCK) MBNB   Lumbar spondylosis (Chronic)   Relevant Medications   ibuprofen (ADVIL,MOTRIN) 800 MG tablet   HYDROcodone-acetaminophen (NORCO/VICODIN) 5-325 MG tablet   HYDROcodone-acetaminophen (NORCO/VICODIN) 5-325 MG tablet   HYDROcodone-acetaminophen (NORCO/VICODIN) 5-325 MG tablet   Other Relevant Orders   LUMBAR FACET(MEDIAL BRANCH NERVE BLOCK) MBNB     Medium   Long term current use of opiate analgesic (Chronic)       Pharmacotherapy (Medications Ordered): Meds ordered this encounter  Medications  . HYDROcodone-acetaminophen (NORCO/VICODIN) 5-325 MG tablet    Sig: Take 1 tablet by mouth 2 (two) times daily as needed for severe pain.    Dispense:  60 tablet    Refill:  0    Do not place this medication, or any other prescription from our practice, on "Automatic Refill". Patient may have prescription filled one day early if pharmacy is closed on scheduled refill date. Do not fill until: 02/21/16 To last until: 03/22/16  . HYDROcodone-acetaminophen (NORCO/VICODIN) 5-325 MG tablet    Sig: Take 1 tablet by mouth 2 (two) times daily as needed for severe pain.    Dispense:  60 tablet    Refill:  0    Do not place this medication, or any other prescription from our practice, on "Automatic Refill". Patient may have prescription filled one day  early if pharmacy is closed on scheduled refill date. Do not fill until: 03/22/16 To last until: 04/21/16  . HYDROcodone-acetaminophen (NORCO/VICODIN) 5-325 MG tablet    Sig: Take 1 tablet by mouth 2 (two) times daily as needed for severe pain.    Dispense:  60 tablet    Refill:  0    Do not place this medication, or any other prescription from our practice, on "Automatic Refill". Patient may have prescription filled one day early if pharmacy is closed on scheduled refill date. Do not fill until: 04/21/16 To last until: 05/21/16  . gabapentin (NEURONTIN) 300 MG capsule    Sig: Take 1-3 capsules (300-900 mg total) by mouth 4 (four) times daily.    Dispense:  360 capsule    Refill:  2    Do not place this medication, or any other prescription from our practice, on "Automatic Refill". Patient may have prescription filled one day early if pharmacy is closed on scheduled refill date.    Lab-work & Procedure Ordered: Orders Placed This Encounter  Procedures  . LUMBAR FACET(MEDIAL BRANCH NERVE BLOCK) MBNB  . SACROILIAC JOINT INJECTINS  .  HIP INJECTION    Imaging Ordered: None  Interventional Therapies: Scheduled:  Diagnostic bilateral lumbar facet block under fluoroscopic guidance and IV sedation #2 + bilateral diagnostic sacroiliac joint injection.   Considering:  Lumbar facet and sacroiliac joint radiofrequency ablation.    PRN Procedures:  Diagnostic left intra-articular hip joint injection under fluoroscopic guidance, with or without sedation.    Referral(s) or Consult(s): None at this time.  New Prescriptions   GABAPENTIN (NEURONTIN) 300 MG CAPSULE    Take 1-3 capsules (300-900 mg total) by mouth 4 (four) times daily.    Medications administered during this visit: [name] [name] had no medications administered during this visit.  Requested PM Follow-up: Return in about 3 months (around 05/14/2016) for Medication Management, (3-Mo), Procedure (Scheduled).  Future  Appointments Date Time Provider [place]  02/13/2016 11:20 AM Milinda Pointer, MD [facility] None    Primary Care Physician: Elisabeth Cara, NP Location: [facility] Note by: Kathlen Brunswick. Dossie Arbour, M.D, DABA, DABAPM, DABPM, DABIPP, FIPP  Pain Score Disclaimer: We use the NRS-11 scale. This is a self-reported, subjective measurement of pain severity with only modest accuracy. It is used primarily to identify changes within a particular patient. It must be understood that outpatient pain scales are significantly less accurate that those used for research, where they can be applied under ideal controlled circumstances with minimal exposure to variables. In reality, the score is likely to be a combination of pain intensity and pain affect, where pain affect describes the degree of emotional arousal or changes in action readiness caused by the sensory experience of pain. Factors such as social and work situation, setting, emotional state, anxiety levels, expectation, and prior pain experience may influence pain perception and show large inter-individual differences that may also be affected by time variables.  Patient instructions provided at this appointment:: Patient Instructions  Facet Blocks Patient Information  Description: The facets are joints in the spine between the vertebrae.  Like any joints in the body, facets can become irritated and painful.  Arthritis can also effect the facets.  By injecting steroids and local anesthetic in and around these joints, we can temporarily block the nerve supply to them.  Steroids act directly on irritated nerves and tissues to reduce selling and inflammation which often leads to decreased pain.  Facet blocks may be done anywhere along the spine from the neck to the low back depending upon the location of your pain.   After numbing the skin with local anesthetic (like Novocaine), a small needle is passed onto the facet  joints under x-ray guidance.  You may experience a sensation of pressure while this is being done.  The entire block usually lasts about 15-25 minutes.   Conditions which may be treated by facet blocks:   Low back/buttock pain  Neck/shoulder pain  Certain types of headaches  Preparation for the injection:  1. Do not eat any solid food or dairy products within 8 hours of your appointment. 2. You may drink clear liquid up to 3 hours before appointment.  Clear liquids include water, black coffee, juice or soda.  No milk or cream please. 3. You may take your regular medication, including pain medications, with a sip of water before your appointment.  Diabetics should hold regular insulin (if taken separately) and take 1/2 normal NPH dose the morning of the procedure.  Carry some sugar containing items with you to your appointment. 4. A driver must accompany you and be prepared to drive you  home after your procedure. 5. Bring all your current medications with you. 6. An IV may be inserted and sedation may be given at the discretion of the physician. 7. A blood pressure cuff, EKG and other monitors will often be applied during the procedure.  Some patients may need to have extra oxygen administered for a short period. 8. You will be asked to provide medical information, including your allergies and medications, prior to the procedure.  We must know immediately if you are taking blood thinners (like Coumadin/Warfarin) or if you are allergic to IV iodine contrast (dye).  We must know if you could possible be pregnant.  Possible side-effects:   Bleeding from needle site  Infection (rare, may require surgery)  Nerve injury (rare)  Numbness & tingling (temporary)  Difficulty urinating (rare, temporary)  Spinal headache (a headache worse with upright posture)  Light-headedness (temporary)  Pain at injection site (serveral days)  Decreased blood pressure (rare, temporary)  Weakness in  arm/leg (temporary)  Pressure sensation in back/neck (temporary)   Call if you experience:   Fever/chills associated with headache or increased back/neck pain  Headache worsened by an upright position  New onset, weakness or numbness of an extremity below the injection site  Hives or difficulty breathing (go to the emergency room)  Inflammation or drainage at the injection site(s)  Severe back/neck pain greater than usual  New symptoms which are concerning to you  Please note:  Although the local anesthetic injected can often make your back or neck feel good for several hours after the injection, the pain will likely return. It takes 3-7 days for steroids to work.  You may not notice any pain relief for at least one week.  If effective, we will often do a series of 2-3 injections spaced 3-6 weeks apart to maximally decrease your pain.  After the initial series, you may be a candidate for a more permanent nerve block of the facets.  If you have any questions, please call #[phone]) [facility]

## 2016-02-13 ENCOUNTER — Encounter: Payer: Medicare Other | Admitting: Pain Medicine

## 2016-02-21 ENCOUNTER — Encounter: Payer: Self-pay | Admitting: Pain Medicine

## 2016-02-21 ENCOUNTER — Ambulatory Visit: Payer: Medicare Other | Attending: Pain Medicine | Admitting: Pain Medicine

## 2016-02-21 VITALS — BP 134/81 | HR 71 | Temp 97.9°F | Resp 14 | Ht 63.0 in | Wt 165.0 lb

## 2016-02-21 DIAGNOSIS — M25552 Pain in left hip: Secondary | ICD-10-CM | POA: Insufficient documentation

## 2016-02-21 DIAGNOSIS — L910 Hypertrophic scar: Secondary | ICD-10-CM | POA: Insufficient documentation

## 2016-02-21 DIAGNOSIS — M940 Chondrocostal junction syndrome [Tietze]: Secondary | ICD-10-CM | POA: Diagnosis not present

## 2016-02-21 DIAGNOSIS — M533 Sacrococcygeal disorders, not elsewhere classified: Secondary | ICD-10-CM | POA: Diagnosis not present

## 2016-02-21 DIAGNOSIS — G8929 Other chronic pain: Secondary | ICD-10-CM | POA: Insufficient documentation

## 2016-02-21 DIAGNOSIS — M47816 Spondylosis without myelopathy or radiculopathy, lumbar region: Secondary | ICD-10-CM | POA: Diagnosis not present

## 2016-02-21 DIAGNOSIS — G4730 Sleep apnea, unspecified: Secondary | ICD-10-CM | POA: Insufficient documentation

## 2016-02-21 DIAGNOSIS — R700 Elevated erythrocyte sedimentation rate: Secondary | ICD-10-CM | POA: Diagnosis not present

## 2016-02-21 DIAGNOSIS — F10239 Alcohol dependence with withdrawal, unspecified: Secondary | ICD-10-CM | POA: Insufficient documentation

## 2016-02-21 DIAGNOSIS — I2510 Atherosclerotic heart disease of native coronary artery without angina pectoris: Secondary | ICD-10-CM | POA: Insufficient documentation

## 2016-02-21 DIAGNOSIS — M549 Dorsalgia, unspecified: Secondary | ICD-10-CM | POA: Diagnosis present

## 2016-02-21 DIAGNOSIS — X58XXXA Exposure to other specified factors, initial encounter: Secondary | ICD-10-CM | POA: Insufficient documentation

## 2016-02-21 DIAGNOSIS — R079 Chest pain, unspecified: Secondary | ICD-10-CM | POA: Diagnosis present

## 2016-02-21 DIAGNOSIS — E559 Vitamin D deficiency, unspecified: Secondary | ICD-10-CM | POA: Insufficient documentation

## 2016-02-21 DIAGNOSIS — S37009A Unspecified injury of unspecified kidney, initial encounter: Secondary | ICD-10-CM | POA: Insufficient documentation

## 2016-02-21 DIAGNOSIS — Z79891 Long term (current) use of opiate analgesic: Secondary | ICD-10-CM | POA: Diagnosis not present

## 2016-02-21 DIAGNOSIS — R0789 Other chest pain: Secondary | ICD-10-CM | POA: Insufficient documentation

## 2016-02-21 DIAGNOSIS — M545 Low back pain: Secondary | ICD-10-CM | POA: Insufficient documentation

## 2016-02-21 DIAGNOSIS — J449 Chronic obstructive pulmonary disease, unspecified: Secondary | ICD-10-CM | POA: Diagnosis not present

## 2016-02-21 MED ORDER — ROPIVACAINE HCL 2 MG/ML IJ SOLN: 9.0000 mL | Freq: Once | INTRAMUSCULAR | Status: DC

## 2016-02-21 MED ORDER — FENTANYL CITRATE (PF) 100 MCG/2ML IJ SOLN
INTRAMUSCULAR | Status: DC
Start: 2016-02-21 — End: 2016-02-21
  Filled 2016-02-21: qty 2

## 2016-02-21 MED ORDER — TRIAMCINOLONE ACETONIDE 40 MG/ML IJ SUSP
INTRAMUSCULAR | Status: AC
  Administered 2016-02-21: 80 mg
  Filled 2016-02-21: qty 2

## 2016-02-21 MED ORDER — MIDAZOLAM HCL 5 MG/5ML IJ SOLN
INTRAMUSCULAR | Status: AC
  Filled 2016-02-21: qty 5

## 2016-02-21 MED ORDER — FENTANYL CITRATE (PF) 100 MCG/2ML IJ SOLN: 25.0000 ug | INTRAMUSCULAR | Status: DC | PRN

## 2016-02-21 MED ORDER — METHYLPREDNISOLONE ACETATE 80 MG/ML IJ SUSP
80.0000 mg | Freq: Once | INTRAMUSCULAR | Status: AC
  Administered 2016-02-21: 80 mg

## 2016-02-21 MED ORDER — MIDAZOLAM HCL 5 MG/5ML IJ SOLN: 1.0000 mg | INTRAMUSCULAR | Status: DC | PRN

## 2016-02-21 MED ORDER — METHYLPREDNISOLONE ACETATE 80 MG/ML IJ SUSP
INTRAMUSCULAR | Status: AC
  Administered 2016-02-21: 80 mg
  Filled 2016-02-21: qty 1

## 2016-02-21 MED ORDER — ROPIVACAINE HCL 2 MG/ML IJ SOLN
9.0000 mL | Freq: Once | INTRAMUSCULAR | Status: AC
  Administered 2016-02-21: 27 mL

## 2016-02-21 MED ORDER — TRIAMCINOLONE ACETONIDE 40 MG/ML IJ SUSP
40.0000 mg | Freq: Once | INTRAMUSCULAR | Status: AC
  Administered 2016-02-21: 80 mg

## 2016-02-21 MED ORDER — ROPIVACAINE HCL 2 MG/ML IJ SOLN
INTRAMUSCULAR | Status: AC
  Administered 2016-02-21: 27 mL
  Filled 2016-02-21: qty 30

## 2016-02-21 MED ORDER — LIDOCAINE HCL (PF) 1 % IJ SOLN: 10.0000 mL | Freq: Once | INTRAMUSCULAR | Status: DC

## 2016-02-21 MED ORDER — LACTATED RINGERS IV SOLN: 1000.0000 mL | Freq: Once | INTRAVENOUS | Status: DC

## 2016-02-21 NOTE — Patient Instructions (Signed)
Facet Blocks Patient Information  Description: The facets are joints in the spine between the vertebrae.  Like any joints in the body, facets can become irritated and painful.  Arthritis can also effect the facets.  By injecting steroids and local anesthetic in and around these joints, we can temporarily block the nerve supply to them.  Steroids act directly on irritated nerves and tissues to reduce selling and inflammation which often leads to decreased pain.  Facet blocks may be done anywhere along the spine from the neck to the low back depending upon the location of your pain.   After numbing the skin with local anesthetic (like Novocaine), a small needle is passed onto the facet joints under x-ray guidance.  You may experience a sensation of pressure while this is being done.  The entire block usually lasts about 15-25 minutes.   Conditions which may be treated by facet blocks:   Low back/buttock pain  Neck/shoulder pain  Certain types of headaches  Preparation for the injection:  1. Do not eat any solid food or dairy products within 8 hours of your appointment. 2. You may drink clear liquid up to 3 hours before appointment.  Clear liquids include water, black coffee, juice or soda.  No milk or cream please. 3. You may take your regular medication, including pain medications, with a sip of water before your appointment.  Diabetics should hold regular insulin (if taken separately) and take 1/2 normal NPH dose the morning of the procedure.  Carry some sugar containing items with you to your appointment. 4. A driver must accompany you and be prepared to drive you home after your procedure. 5. Bring all your current medications with you. 6. An IV may be inserted and sedation may be given at the discretion of the physician. 7. A blood pressure cuff, EKG and other monitors will often be applied during the procedure.  Some patients may need to have extra oxygen administered for a short  period. 8. You will be asked to provide medical information, including your allergies and medications, prior to the procedure.  We must know immediately if you are taking blood thinners (like Coumadin/Warfarin) or if you are allergic to IV iodine contrast (dye).  We must know if you could possible be pregnant.  Possible side-effects:   Bleeding from needle site  Infection (rare, may require surgery)  Nerve injury (rare)  Numbness & tingling (temporary)  Difficulty urinating (rare, temporary)  Spinal headache (a headache worse with upright posture)  Light-headedness (temporary)  Pain at injection site (serveral days)  Decreased blood pressure (rare, temporary)  Weakness in arm/leg (temporary)  Pressure sensation in back/neck (temporary)   Call if you experience:   Fever/chills associated with headache or increased back/neck pain  Headache worsened by an upright position  New onset, weakness or numbness of an extremity below the injection site  Hives or difficulty breathing (go to the emergency room)  Inflammation or drainage at the injection site(s)  Severe back/neck pain greater than usual  New symptoms which are concerning to you  Please note:  Although the local anesthetic injected can often make your back or neck feel good for several hours after the injection, the pain will likely return. It takes 3-7 days for steroids to work.  You may not notice any pain relief for at least one week.  If effective, we will often do a series of 2-3 injections spaced 3-6 weeks apart to maximally decrease your pain.  After the initial   series, you may be a candidate for a more permanent nerve block of the facets.  If you have any questions, please call #[phone]) [place]: Please fill out the post procedure pain diary and bring it with you at your next appointment.  Be careful moving about. Muscle spasms in the area of the  injection may occur.  Use ice for the next 24 hours (15 minutes on, 15 minutes off).  After 24 hours, you may use heat for comfort if you wish.  Post procedure numbness or redness is expected, average 4-6 hours.  If numbness develops after 4-6 hours and is felt to be progressing and worsening, immediately contact your physician.

## 2016-02-21 NOTE — Progress Notes (Signed)
Safety precautions to be maintained throughout the outpatient stay will include: orient to surroundings, keep bed in low position, maintain call bell within reach at all times, provide assistance with transfer out of bed and ambulation.  

## 2016-02-21 NOTE — Progress Notes (Signed)
Patient's Name: [name] [name].  Patient type: Established  MRN: [id number]  Service setting: Ambulatory outpatient  DOB: 04-13-1956  Location: [facility]  DOS: 02/21/2016  Primary Care Physician: Elisabeth Cara, NP  Note by: Kathlen Brunswick. Dossie Arbour, M.D, DABA, DABAPM, DABPM, DABIPP, [facility]  Referring Physician: Elisabeth Cara, NP  Specialty: Board-Certified Interventional Pain Management  Last Visit to Pain Management: 02/06/2016   Primary Reason(s) for Visit: Interventional Pain Management Treatment. CC: Back Pain and Chest Pain  Primary Diagnosis: Facet syndrome, lumbar [M54.5]   Procedure:  Anesthesia, Analgesia, Anxiolysis:  Procedure #1: Type: Diagnostic Medial Branch Facet Block Region: Lumbar Level: L2, L3, L4, L5, & S1 Medial Branch Level(s) Laterality: Bilateral  Procedure #2: Type: Diagnostic Sacroiliac Joint Block Region: Posterior Lumbosacral Level: PSIS (Posterior Superior Iliac Spine) Sacroiliac Joint Laterality: Bilateral  Indications: 1. Lumbar facet syndrome (Bilateral) (R>L)   2. Lumbar spondylosis, unspecified spinal osteoarthritis   3. Chronic sacroiliac joint pain (Bilateral) (L>R)   4. Chronic low back pain (Bilateral) (R>L)     Pre-procedure Pain Score: 3/10 Reported level of pain is compatible with clinical observations Post-procedure Pain Score: 0-No pain  Type: Moderate (Conscious) Sedation & Local Anesthesia Local Anesthetic: Lidocaine 1% Route: Intravenous (IV) IV Access: Secured Sedation: Meaningful verbal contact was maintained at all times during the procedure  Indication(s): Analgesia & Anxiolysis   Pre-Procedure Assessment  [name] [name] is a 60 y.o. year old, male patient, seen today for interventional treatment. He has Apnea, sleep; H/O coronary artery bypass surgery; Obstructive apnea; BP (high blood pressure); HLD (hyperlipidemia); Arteriosclerosis of coronary artery; Chronic obstructive pulmonary disease ([place]);  Alcohol withdrawal syndrome ([place]); Injury of kidney; Chronic low back pain (Bilateral) (R>L); Chronic shoulder pain (Location of Tertiary source of pain) (Right); Chronic hip pain (Left); Chronic pain; History of alcoholism ([place]); Opiate use; Encounter for therapeutic drug level monitoring; Chronic chest wall pain (Location of Primary Source of Pain) (Incisional Midline) (since 04/22/2012); Chronic lower extremity pain (Location of Secondary source of pain) (Bilateral) (R>L); History of MI (myocardial infarction) (January 2014); Encounter for pain management planning; Chronic sacroiliac joint pain (Bilateral) (L>R); Neuropathic pain; Neurogenic pain; Incisional pain; Elevated sedimentation rate; Vitamin D insufficiency; Lumbar facet syndrome (Bilateral) (R>L); Lumbar spondylosis; Long term current use of opiate analgesic; Long term prescription opiate use; Keloid skin disorder; and Costochondritis on his problem list.. His primarily concern today is the Back Pain and Chest Pain   Pain Type: Chronic pain Pain Location: Back (chest) Pain Orientation: Lower Pain Descriptors / Indicators: Sharp, Pressure Pain Frequency: Constant  Date of Last Visit: 02/06/16 Service Provided on Last Visit: Evaluation  Coagulation Parameters Lab Results  Component Value Date   INR 0.97 02/09/2015   LABPROT 13.1 02/09/2015   APTT > 160.0* 04/21/2012   PLT 185 01/28/2016    Verification of the correct person, correct site (including marking of site), and correct procedure were performed and confirmed by the patient.  Consent: Secured. Under the influence of no sedatives a written informed consent was obtained, after having provided information on the risks and possible complications. To fulfill our ethical and legal obligations, as recommended by the American Medical Association's Code of Ethics, we have provided information to the patient about our clinical impression; the nature and purpose of the treatment or  procedure; the risks, benefits, and possible complications of the intervention; alternatives; the risk(s) and benefit(s) of the alternative treatment(s) or procedure(s); and the risk(s) and benefit(s) of doing nothing. The patient was provided  information about the risks and possible complications associated with the procedure. These include, but are not limited to, failure to achieve desired goals, infection, bleeding, organ or nerve damage, allergic reactions, paralysis, and death. In the case of spinal procedures these may include, but are not limited to, failure to achieve desired goals, infection, bleeding, organ or nerve damage, allergic reactions, paralysis, and death. In the case of intra- or periarticular procedures these may include, but are not limited to, failure to achieve desired goals, infection, bleeding (hemarthrosis), organ or nerve damage, allergic reactions, and death. In addition, the patient was informed that Medicine is not an exact science; therefore, there is also the possibility of unforeseen risks and possible complications that may result in a catastrophic outcome. The patient indicated having understood very clearly. We have given the patient no guarantees and we have made no promises. Enough time was given to the patient to ask questions, all of which were answered to the patient's satisfaction.  Consent Attestation: I, the ordering provider, attest that I have discussed with the patient the benefits, risks, side-effects, alternatives, likelihood of achieving goals, and potential problems during recovery for the procedure that I have provided informed consent.  Pre-Procedure Preparation: Safety Precautions: Allergies reviewed. Appropriate site, procedure, and patient were confirmed by following the Joint Commission's Universal Protocol (UP.01.01.01), in the form of a "Time Out". The patient was asked to confirm marked site and procedure, before commencing. The patient was asked  about blood thinners, or active infections, both of which were denied. Patient was assessed for positional comfort and all pressure points were checked before starting procedure. Allergies: He has No Known Allergies.. Infection Control Precautions: Sterile technique used. Standard Universal Precautions were taken as recommended by the Department of [facility] for Disease Control and Prevention (CDC). Standard pre-surgical skin prep was conducted. Respiratory hygiene and cough etiquette was practiced. Hand hygiene observed. Safe injection practices and needle disposal techniques followed. SDV (single dose vial) medications used. Medications properly checked for expiration dates and contaminants. Personal protective equipment (PPE) used: Sterile Radiation-resistant gloves. Monitoring:  As per clinic protocol. Filed Vitals:   02/21/16 1340 02/21/16 1350 02/21/16 1400 02/21/16 1410  BP: 133/90 142/76 138/86 134/81  Pulse: 76 71 73 71  Temp:  97.8 F (36.6 C)  97.9 F (36.6 C)  TempSrc:  Temporal  Temporal  Resp: '12 13 15 14  ' Height:      Weight:      SpO2: 98% 93% 96% 96%  Calculated BMI: Body mass index is 29.24 kg/(m^2).  Description of Procedure #1 Process:   Time-out: "Time-out" completed before starting procedure, as per protocol. Position: Prone Target Area: For Lumbar Facet blocks, the target is the groove formed by the junction of the transverse process and superior articular process. For the L5 dorsal ramus, the target is the notch between superior articular process and sacral ala. For the S1 dorsal ramus, the target is the superior and lateral edge of the posterior S1 Sacral foramen. Approach: Paramedial approach. Area Prepped: Entire Posterior Lumbosacral Region Prepping solution: ChloraPrep (2% chlorhexidine gluconate and 70% isopropyl alcohol) Safety Precautions: Aspiration looking for blood return was conducted prior to all injections. At no point did we inject any  substances, as a needle was being advanced. No attempts were made at seeking any paresthesias. Safe injection practices and needle disposal techniques used. Medications properly checked for expiration dates. SDV (single dose vial) medications used.     Description of the Procedure: Protocol guidelines were  followed. The patient was placed in position over the fluoroscopy table. The target area was identified and the area prepped in the usual manner. Skin desensitized using vapocoolant spray. Skin & deeper tissues infiltrated with local anesthetic. Appropriate amount of time allowed to pass for local anesthetics to take effect. The procedure needle was introduced through the skin, ipsilateral to the reported pain, and advanced to the target area. Employing the "Medial Branch Technique", the needles were advanced to the angle made by the superior and medial portion of the transverse process, and the lateral and inferior portion of the superior articulating process of the targeted vertebral bodies. This area is known as "Burton's Eye" or the "Eye of the [place] Dog". A procedure needle was introduced through the skin, and this time advanced to the angle made by the superior and medial border of the sacral ala, and the lateral border of the S1 vertebral body. This last needle was later repositioned at the superior and lateral border of the posterior S1 foramen. Negative aspiration confirmed. Solution injected in intermittent fashion, asking for systemic symptoms every 0.5cc of injectate. The needles were then removed and the area cleansed, making sure to leave some of the prepping solution back to take advantage of its long term bactericidal properties. Materials & Medications Used:  Needle(s) Used: 22g - 3.5" Spinal Needle(s)  Description of Procedure # 2 Process:   Time-out: "Time-out" completed before starting procedure, as per protocol. Position: Prone Target Area: For upper sacroiliac joint block(s), the  target is the superior and posterior margin of the sacroiliac joint. Approach: Ipsilateral approach. Area Prepped: Entire Posterior Lumbosacral Region Prepping solution: Duraprep (Iodine Povacrylex [0.7% available Iodine] and Isopropyl Alcohol, 74% w/w) Safety Precautions: Aspiration looking for blood return was conducted prior to all injections. At no point did we inject any substances, as a needle was being advanced. No attempts were made at seeking any paresthesias. Safe injection practices and needle disposal techniques used. Medications properly checked for expiration dates. SDV (single dose vial) medications used. Description of the Procedure: Protocol guidelines were followed. The patient was placed in position over the fluoroscopy table. The target area was identified and the area prepped in the usual manner. Skin desensitized using vapocoolant spray. Skin & deeper tissues infiltrated with local anesthetic. Appropriate amount of time allowed to pass for local anesthetics to take effect. The procedure needle was advanced under fluoroscopic guidance into the sacroiliac joint until a firm endpoint was obtained. Proper needle placement secured. Negative aspiration confirmed. Solution injected in intermittent fashion, asking for systemic symptoms every 0.5cc of injectate. The needles were then removed and the area cleansed, making sure to leave some of the prepping solution back to take advantage of its long term bactericidal properties. Materials & Medications Used:  Needle(s) Used: 22g - 3.5" Spinal Needle(s)  EBL: None Medications Administered today: We administered triamcinolone acetonide, ropivacaine (PF) 2 mg/ml (0.2%), methylPREDNISolone acetate, ropivacaine (PF) 2 mg/ml (0.2%), methylPREDNISolone acetate, and triamcinolone acetonide.Please see chart orders for dosing details.  Imaging Guidance:   Type of Imaging Technique: Fluoroscopy Guidance (Spinal) Indication(s): Assistance in needle  guidance and placement for procedures requiring needle placement in or near specific anatomical locations not easily accessible without such assistance. Exposure Time: Please see nurses notes. Contrast: None required. Fluoroscopic Guidance: I was personally present in the fluoroscopy suite, where the patient was placed in position for the procedure, over the fluoroscopy-compatible table. Fluoroscopy was manipulated, using "Tunnel Vision Technique", to obtain the best possible view of the  target area, on the affected side. Parallax error was corrected before commencing the procedure. A "direction-depth-direction" technique was used to introduce the needle under continuous pulsed fluoroscopic guidance. Once the target was reached, antero-posterior, oblique, and lateral fluoroscopic projection views were taken to confirm needle placement in all planes. Permanently recorded images stored by scanning into EMR. Interpretation: Intraoperative imaging interpretation by performing Physician. Adequate needle placement confirmed. Adequate needle placement confirmed in AP, lateral, & Oblique Views. No contrast injected.  Antibiotic Prophylaxis:  Indication(s): No indications identified. Type:  Antibiotics Given (last 72 hours)    None       Post-operative Assessment:   Complications: No immediate post-treatment complications were observed. Disposition: Return to clinic for follow-up evaluation. The patient tolerated the entire procedure well. A repeat set of vitals were taken after the procedure and the patient was kept under observation following institutional policy, for this procedure. Post-procedural neurological assessment was performed, showing return to baseline, prior to discharge. The patient was discharged home, once institutional criteria were met. The patient was provided with post-procedure discharge instructions, including a section on how to identify potential problems. Should any problems arise  concerning this procedure, the patient was given instructions to immediately contact us, at any time, without hesitation. In any case, we plan to contact the patient by telephone for a follow-up status report regarding this interventional procedure. Comments:  No additional relevant information.  Medications administered during this visit: We administered triamcinolone acetonide, ropivacaine (PF) 2 mg/ml (0.2%), methylPREDNISolone acetate, ropivacaine (PF) 2 mg/ml (0.2%), methylPREDNISolone acetate, and triamcinolone acetonide.  Prescriptions ordered during this visit: New Prescriptions   No medications on file    Future Appointments Date Time Provider [place]  03/29/2016 9:40 AM Milinda Pointer, MD [facility] None  04/30/2016 10:20 AM Milinda Pointer, MD [facility] None    Primary Care Physician: Elisabeth Cara, NP Location: [facility] Note by: Kathlen Brunswick. Dossie Arbour, M.D, DABA, DABAPM, DABPM, DABIPP, FIPP  Disclaimer:  Medicine is not an exact science. The only guarantee in medicine is that nothing is guaranteed. It is important to note that the decision to proceed with this intervention was based on the information collected from the patient. The Data and conclusions were drawn from the patient's questionnaire, the interview, and the physical examination. Because the information was provided in large part by the patient, it cannot be guaranteed that it has not been purposely or unconsciously manipulated. Every effort has been made to obtain as much relevant data as possible for this evaluation. It is important to note that the conclusions that lead to this procedure are derived in large part from the available data. Always take into account that the treatment will also be dependent on availability of resources and existing treatment guidelines, considered by other Pain Management Practitioners as being common knowledge and practice, at the time of the  intervention. For Medico-Legal purposes, it is also important to point out that variation in procedural techniques and pharmacological choices are the acceptable norm. The indications, contraindications, technique, and results of the above procedure should only be interpreted and judged by a Board-Certified Interventional Pain Specialist with extensive familiarity and expertise in the same exact procedure and technique. Attempts at providing opinions without similar or greater experience and expertise than that of the treating physician will be considered as inappropriate and unethical, and shall result in a formal complaint to the state medical board and applicable specialty societies.

## 2016-02-22 ENCOUNTER — Telehealth: Payer: Self-pay | Admitting: *Deleted

## 2016-02-22 NOTE — Telephone Encounter (Signed)
Message left

## 2016-02-24 ENCOUNTER — Other Ambulatory Visit: Payer: Self-pay

## 2016-02-24 ENCOUNTER — Observation Stay
Admission: EM | Admit: 2016-02-24 | Discharge: 2016-02-25 | Disposition: A | Discharge status: Home / Self Care | Payer: Medicare Other | Attending: Internal Medicine | Admitting: Internal Medicine

## 2016-02-24 ENCOUNTER — Encounter: Payer: Self-pay | Admitting: Emergency Medicine

## 2016-02-24 ENCOUNTER — Emergency Department: Payer: Medicare Other

## 2016-02-24 DIAGNOSIS — Z8249 Family history of ischemic heart disease and other diseases of the circulatory system: Secondary | ICD-10-CM | POA: Diagnosis not present

## 2016-02-24 DIAGNOSIS — R079 Chest pain, unspecified: Secondary | ICD-10-CM

## 2016-02-24 DIAGNOSIS — Z79891 Long term (current) use of opiate analgesic: Secondary | ICD-10-CM | POA: Diagnosis not present

## 2016-02-24 DIAGNOSIS — J449 Chronic obstructive pulmonary disease, unspecified: Secondary | ICD-10-CM | POA: Insufficient documentation

## 2016-02-24 DIAGNOSIS — E559 Vitamin D deficiency, unspecified: Secondary | ICD-10-CM | POA: Insufficient documentation

## 2016-02-24 DIAGNOSIS — E860 Dehydration: Secondary | ICD-10-CM | POA: Insufficient documentation

## 2016-02-24 DIAGNOSIS — I200 Unstable angina: Secondary | ICD-10-CM | POA: Diagnosis present

## 2016-02-24 DIAGNOSIS — Z7982 Long term (current) use of aspirin: Secondary | ICD-10-CM | POA: Diagnosis not present

## 2016-02-24 DIAGNOSIS — I10 Essential (primary) hypertension: Secondary | ICD-10-CM | POA: Insufficient documentation

## 2016-02-24 DIAGNOSIS — Z951 Presence of aortocoronary bypass graft: Secondary | ICD-10-CM | POA: Diagnosis not present

## 2016-02-24 DIAGNOSIS — G4730 Sleep apnea, unspecified: Secondary | ICD-10-CM | POA: Insufficient documentation

## 2016-02-24 DIAGNOSIS — I429 Cardiomyopathy, unspecified: Secondary | ICD-10-CM | POA: Insufficient documentation

## 2016-02-24 DIAGNOSIS — I252 Old myocardial infarction: Secondary | ICD-10-CM | POA: Diagnosis not present

## 2016-02-24 DIAGNOSIS — Z955 Presence of coronary angioplasty implant and graft: Secondary | ICD-10-CM | POA: Insufficient documentation

## 2016-02-24 DIAGNOSIS — Z86010 Personal history of colonic polyps: Secondary | ICD-10-CM | POA: Insufficient documentation

## 2016-02-24 DIAGNOSIS — Z87891 Personal history of nicotine dependence: Secondary | ICD-10-CM | POA: Diagnosis not present

## 2016-02-24 DIAGNOSIS — M109 Gout, unspecified: Secondary | ICD-10-CM | POA: Insufficient documentation

## 2016-02-24 DIAGNOSIS — I25110 Atherosclerotic heart disease of native coronary artery with unstable angina pectoris: Secondary | ICD-10-CM | POA: Diagnosis not present

## 2016-02-24 DIAGNOSIS — J984 Other disorders of lung: Secondary | ICD-10-CM | POA: Diagnosis not present

## 2016-02-24 DIAGNOSIS — M545 Low back pain: Secondary | ICD-10-CM

## 2016-02-24 DIAGNOSIS — E785 Hyperlipidemia, unspecified: Secondary | ICD-10-CM | POA: Diagnosis not present

## 2016-02-24 DIAGNOSIS — G8929 Other chronic pain: Secondary | ICD-10-CM | POA: Insufficient documentation

## 2016-02-24 LAB — COMPREHENSIVE METABOLIC PANEL
ALK PHOS: 69 U/L (ref 38–126)
ALT: 65 U/L — ABNORMAL HIGH (ref 17–63)
AST: 39 U/L (ref 15–41)
Albumin: 4.3 g/dL (ref 3.5–5.0)
Anion gap: 11 (ref 5–15)
BILIRUBIN TOTAL: 1.2 mg/dL (ref 0.3–1.2)
BUN: 32 mg/dL — ABNORMAL HIGH (ref 6–20)
CALCIUM: 9.1 mg/dL (ref 8.9–10.3)
CO2: 24 mmol/L (ref 22–32)
CREATININE: 1.07 mg/dL (ref 0.61–1.24)
Chloride: 100 mmol/L — ABNORMAL LOW (ref 101–111)
Glucose, Bld: 191 mg/dL — ABNORMAL HIGH (ref 65–99)
Potassium: 4.1 mmol/L (ref 3.5–5.1)
SODIUM: 135 mmol/L (ref 135–145)
TOTAL PROTEIN: 7.5 g/dL (ref 6.5–8.1)

## 2016-02-24 LAB — CBC WITH DIFFERENTIAL/PLATELET
Basophils Absolute: 0.1 10*3/uL (ref 0–0.1)
Basophils Relative: 1 %
EOS ABS: 0 10*3/uL (ref 0–0.7)
HCT: 35.6 % — ABNORMAL LOW (ref 40.0–52.0)
HEMOGLOBIN: 13.2 g/dL (ref 13.0–18.0)
LYMPHS ABS: 1.2 10*3/uL (ref 1.0–3.6)
MCH: 32.8 pg (ref 26.0–34.0)
MCHC: 37 g/dL — AB (ref 32.0–36.0)
MCV: 88.6 fL (ref 80.0–100.0)
Monocytes Absolute: 0.9 10*3/uL (ref 0.2–1.0)
Neutro Abs: 11.2 10*3/uL — ABNORMAL HIGH (ref 1.4–6.5)
Platelets: 180 10*3/uL (ref 150–440)
RBC: 4.02 MIL/uL — ABNORMAL LOW (ref 4.40–5.90)
RDW: 14.3 % (ref 11.5–14.5)
WBC: 13.4 10*3/uL — ABNORMAL HIGH (ref 3.8–10.6)

## 2016-02-24 LAB — TROPONIN I: Troponin I: <0.03 ng/mL (ref <0.031)

## 2016-02-24 MED ORDER — SODIUM CHLORIDE 0.9% FLUSH: 3.0000 mL | INTRAVENOUS | Status: DC | PRN

## 2016-02-24 MED ORDER — MORPHINE SULFATE (PF) 4 MG/ML IV SOLN: 4.0000 mg | INTRAVENOUS | Status: DC | PRN

## 2016-02-24 MED ORDER — GABAPENTIN 300 MG PO CAPS
900.0000 mg | ORAL_CAPSULE | Freq: Every day | ORAL | Status: DC
  Administered 2016-02-24: 900 mg via ORAL
  Filled 2016-02-24: qty 3

## 2016-02-24 MED ORDER — METOPROLOL TARTRATE 50 MG PO TABS
50.0000 mg | ORAL_TABLET | Freq: Three times a day (TID) | ORAL | Status: DC
Start: 2016-02-24 — End: 2016-02-25
  Administered 2016-02-24 – 2016-02-25 (×2): 50 mg via ORAL
  Filled 2016-02-24 (×2): qty 1

## 2016-02-24 MED ORDER — HYDROCODONE-ACETAMINOPHEN 5-325 MG PO TABS
1.0000 | ORAL_TABLET | ORAL | Status: DC | PRN
  Administered 2016-02-24 – 2016-02-25 (×2): 1 via ORAL
  Filled 2016-02-24 (×2): qty 1

## 2016-02-24 MED ORDER — ONDANSETRON HCL 4 MG/2ML IJ SOLN: 4.0000 mg | Freq: Four times a day (QID) | INTRAMUSCULAR | Status: DC | PRN

## 2016-02-24 MED ORDER — SODIUM CHLORIDE 0.9 % IV SOLN
INTRAVENOUS | Status: DC
  Administered 2016-02-24 – 2016-02-25 (×2): via INTRAVENOUS

## 2016-02-24 MED ORDER — ALLOPURINOL 100 MG PO TABS
300.0000 mg | ORAL_TABLET | Freq: Every evening | ORAL | Status: DC
Start: 2016-02-24 — End: 2016-02-25
  Administered 2016-02-24: 300 mg via ORAL
  Filled 2016-02-24: qty 3

## 2016-02-24 MED ORDER — ALBUTEROL SULFATE (2.5 MG/3ML) 0.083% IN NEBU: 2.5000 mg | INHALATION_SOLUTION | RESPIRATORY_TRACT | Status: DC | PRN

## 2016-02-24 MED ORDER — POTASSIUM 95 MG PO TABS: 95.0000 mg | ORAL_TABLET | Freq: Every day | ORAL | Status: DC

## 2016-02-24 MED ORDER — HYDROCHLOROTHIAZIDE 25 MG PO TABS
25.0000 mg | ORAL_TABLET | Freq: Every day | ORAL | Status: DC
  Administered 2016-02-25: 25 mg via ORAL
  Filled 2016-02-24: qty 1

## 2016-02-24 MED ORDER — ACETAMINOPHEN 325 MG PO TABS
650.0000 mg | ORAL_TABLET | Freq: Four times a day (QID) | ORAL | Status: DC | PRN
Start: 2016-02-24 — End: 2016-02-25

## 2016-02-24 MED ORDER — SODIUM CHLORIDE 0.9% FLUSH: 3.0000 mL | Freq: Two times a day (BID) | INTRAVENOUS | Status: DC

## 2016-02-24 MED ORDER — ASPIRIN 81 MG PO CHEW
324.0000 mg | CHEWABLE_TABLET | Freq: Once | ORAL | Status: AC
  Administered 2016-02-24: 324 mg via ORAL
  Filled 2016-02-24: qty 4

## 2016-02-24 MED ORDER — ENOXAPARIN SODIUM 40 MG/0.4ML ~~LOC~~ SOLN
40.0000 mg | SUBCUTANEOUS | Status: DC
  Administered 2016-02-24: 40 mg via SUBCUTANEOUS
  Filled 2016-02-24: qty 0.4

## 2016-02-24 MED ORDER — ACETAMINOPHEN 650 MG RE SUPP: 650.0000 mg | Freq: Four times a day (QID) | RECTAL | Status: DC | PRN

## 2016-02-24 MED ORDER — AMLODIPINE BESYLATE 10 MG PO TABS
10.0000 mg | ORAL_TABLET | Freq: Every evening | ORAL | Status: DC
  Administered 2016-02-24: 10 mg via ORAL
  Filled 2016-02-24: qty 1

## 2016-02-24 MED ORDER — ONDANSETRON HCL 4 MG PO TABS: 4.0000 mg | ORAL_TABLET | Freq: Four times a day (QID) | ORAL | Status: DC | PRN

## 2016-02-24 MED ORDER — ASPIRIN 325 MG PO TABS
325.0000 mg | ORAL_TABLET | Freq: Every day | ORAL | Status: DC
  Administered 2016-02-25: 325 mg via ORAL
  Filled 2016-02-24: qty 1

## 2016-02-24 MED ORDER — SODIUM CHLORIDE 0.9 % IV SOLN: 250.0000 mL | INTRAVENOUS | Status: DC | PRN

## 2016-02-24 MED ORDER — GABAPENTIN 300 MG PO CAPS
300.0000 mg | ORAL_CAPSULE | Freq: Three times a day (TID) | ORAL | Status: DC
  Administered 2016-02-25 (×2): 300 mg via ORAL
  Filled 2016-02-24 (×2): qty 1

## 2016-02-24 MED ORDER — ISOSORBIDE MONONITRATE ER 30 MG PO TB24
30.0000 mg | ORAL_TABLET | Freq: Every day | ORAL | Status: DC
  Administered 2016-02-25: 30 mg via ORAL
  Filled 2016-02-24: qty 1

## 2016-02-24 MED ORDER — ATORVASTATIN CALCIUM 20 MG PO TABS
40.0000 mg | ORAL_TABLET | Freq: Every day | ORAL | Status: DC
  Administered 2016-02-24: 40 mg via ORAL
  Filled 2016-02-24: qty 2

## 2016-02-24 MED ORDER — GABAPENTIN 300 MG PO CAPS: 300.0000 mg | ORAL_CAPSULE | Freq: Four times a day (QID) | ORAL | Status: DC

## 2016-02-24 NOTE — H&P (Addendum)
[facility] [place] NAME: [name] [name]    MR#:  [id number]  DATE OF BIRTH:  06-25-1956  DATE OF ADMISSION:  02/24/2016  PRIMARY CARE PHYSICIAN: Elisabeth Cara, NP   REQUESTING/REFERRING PHYSICIAN: Joanne Gavel, MD  CHIEF COMPLAINT:   Chief Complaint  Patient presents with  . Chest Pain   Chest pain today HISTORY OF PRESENT ILLNESS:  [name] [name]  is a 60 y.o. male with a known history of CAD status post CABG, chronic chest pain and back pain, hypertension, sleep apnea and hyperlipidemia. The patient presented to the ED with chest pain today, which in both side, pressure-like, lasted about 20 minutes without radiation, relieved by nitroglycerin. He denies any nausea, vomiting or diaphoresis. He got aspirin 324 mg 1 dose in the ED. Of note, he underwent cardiac catheterization by Dr. Ubaldo Glassing on 02/03/2016 which showed significant occlusive disease not amenable to PCI. This included a 70% mid RCA lesion, 75% proximal RCA lesion, 100% proximal graft lesion, 80% LM lesion, and 65% proximal circumflex lesion. Dr. Saralyn Pilar suggested medical treatment.  PAST MEDICAL HISTORY:   Past Medical History  Diagnosis Date  . Diverticulitis   . Hypertension   . Gout   . Sleep apnea   . Hyperlipidemia   . Cardiomyopathy, secondary ([place])   . Coronary artery disease   . Hemorrhoids   . Benign esophageal stricture   . Tubular adenoma of colon     PAST SURGICAL HISTORY:   Past Surgical History  Procedure Laterality Date  . Coronary angioplasty with stent placement      x2  . Cardiac bypass    . Cardiac surgery    . Coronary artery bypass graft    . Colonoscopy with propofol N/A 04/29/2015    Procedure: COLONOSCOPY WITH PROPOFOL;  Surgeon: Hulen Luster, MD;  Location: [facility] ENDOSCOPY;  Service: Gastroenterology;  Laterality: N/A;  . Esophagogastroduodenoscopy (egd) with propofol N/A 04/29/2015    Procedure: ESOPHAGOGASTRODUODENOSCOPY (EGD) WITH  PROPOFOL;  Surgeon: Hulen Luster, MD;  Location: [facility] ENDOSCOPY;  Service: Gastroenterology;  Laterality: N/A;  . Septoplasty    . Cardiac catheterization Left 02/03/2016    Procedure: Left Heart Cath and Coronary Angiography;  Surgeon: Teodoro Spray, MD;  Location: [place] [facility] LAB;  Service: Cardiovascular;  Laterality: Left;    SOCIAL HISTORY:   Social History  Substance Use Topics  . Smoking status: Former Smoker -- 25 years  . Smokeless tobacco: Not on file  . Alcohol Use: 8.4 oz/week    14 Shots of liquor per week     Comment: 14 shot per week    FAMILY HISTORY:   Family History  Problem Relation Age of Onset  . Heart disease Mother   . Hypertension Father     DRUG ALLERGIES:  No Known Allergies  REVIEW OF SYSTEMS:  CONSTITUTIONAL: No fever, fatigue or weakness.  EYES: No blurred or double vision.  EARS, NOSE, AND THROAT: No tinnitus or ear pain.  RESPIRATORY: No cough, shortness of breath, wheezing or hemoptysis.  CARDIOVASCULAR: has chest pain, no orthopnea, edema.  GASTROINTESTINAL: No nausea, vomiting, diarrhea or abdominal pain.  GENITOURINARY: No dysuria, hematuria.  ENDOCRINE: No polyuria, nocturia,  HEMATOLOGY: No anemia, easy bruising or bleeding SKIN: No rash or lesion. MUSCULOSKELETAL: No joint pain or arthritis.   NEUROLOGIC: No tingling, numbness, weakness.  PSYCHIATRY: No anxiety or depression.   MEDICATIONS AT HOME:   Prior to Admission medications  Medication Sig Start Date End Date Taking? Authorizing Provider  allopurinol (ZYLOPRIM) 300 MG tablet Take 300 mg by mouth every evening.    Yes Historical Provider, MD  amLODipine (NORVASC) 10 MG tablet Take 10 mg by mouth every evening.    Yes Historical Provider, MD  aspirin EC 81 MG tablet Take 81 mg by mouth daily.   Yes Historical Provider, MD  atorvastatin (LIPITOR) 40 MG tablet Take 40 mg by mouth at bedtime.    Yes Historical Provider, MD  cholecalciferol (VITAMIN D) 1000 units tablet Take  2,000 Units by mouth daily.   Yes Historical Provider, MD  gabapentin (NEURONTIN) 300 MG capsule Take 300-900 mg by mouth 4 (four) times daily. Pt takes one capsule three times daily and three capsules at bedtime.   Yes Historical Provider, MD  hydrochlorothiazide (HYDRODIURIL) 25 MG tablet Take 25 mg by mouth daily.   Yes Historical Provider, MD  HYDROcodone-acetaminophen (NORCO/VICODIN) 5-325 MG tablet Take 1 tablet by mouth 2 (two) times daily as needed for severe pain. 02/06/16  Yes Milinda Pointer, MD  isosorbide mononitrate (IMDUR) 30 MG 24 hr tablet Take 30 mg by mouth daily.   Yes Historical Provider, MD  metoprolol (LOPRESSOR) 50 MG tablet Take 50 mg by mouth 3 (three) times daily.   Yes Historical Provider, MD  Multiple Vitamin (MULTIVITAMIN WITH MINERALS) TABS tablet Take 1 tablet by mouth every evening.   Yes Historical Provider, MD  Potassium 95 MG TABS Take 95 mg by mouth daily.   Yes Historical Provider, MD  Cholecalciferol (VITAMIN D3) 2000 units capsule Take 1 capsule (2,000 Units total) by mouth daily. Patient not taking: Reported on 02/24/2016 11/21/15   Milinda Pointer, MD      VITAL SIGNS:  Blood pressure 140/90, pulse 85, temperature 98.4 F (36.9 C), resp. rate 16, height 5\' 3"  (1.6 m), weight 165 lb (74.844 kg), SpO2 96 %.  PHYSICAL EXAMINATION:  GENERAL:  60 y.o.-year-old patient lying in the bed with no acute distress.  EYES: Pupils equal, round, reactive to light and accommodation. No scleral icterus. Extraocular muscles intact.  HEENT: Head atraumatic, normocephalic. Oropharynx and nasopharynx clear.  NECK:  Supple, no jugular venous distention. No thyroid enlargement, no tenderness.  LUNGS: Normal breath sounds bilaterally, no wheezing, rales,rhonchi or crepitation. No use of accessory muscles of respiration. Tenderness on chest wall on palpation. CARDIOVASCULAR: S1, S2 normal. No murmurs, rubs, or gallops.  ABDOMEN: Soft, nontender, nondistended. Bowel sounds  present. No organomegaly or mass.  EXTREMITIES: No pedal edema, cyanosis, or clubbing.  NEUROLOGIC: Cranial nerves II through XII are intact. Muscle strength 5/5 in all extremities. Sensation intact. Gait not checked.  PSYCHIATRIC: The patient is alert and oriented x 3.  SKIN: No obvious rash, lesion, or ulcer.   LABORATORY PANEL:   CBC  Recent Labs Lab 02/24/16 1513  WBC 13.4*  HGB 13.2  HCT 35.6*  PLT 180   ------------------------------------------------------------------------------------------------------------------  Chemistries   Recent Labs Lab 02/24/16 1513  NA 135  K 4.1  CL 100*  CO2 24  GLUCOSE 191*  BUN 32*  CREATININE 1.07  CALCIUM 9.1  AST 39  ALT 65*  ALKPHOS 69  BILITOT 1.2   ------------------------------------------------------------------------------------------------------------------  Cardiac Enzymes  Recent Labs Lab 02/24/16 1513  TROPONINI <0.03   ------------------------------------------------------------------------------------------------------------------  RADIOLOGY:  Dg Chest 2 View  02/24/2016  CLINICAL DATA:  Midsternal chest pain beginning today. History of CABG. EXAM: CHEST  2 VIEW COMPARISON:  PA and lateral chest 01/28/2016 and 03/19/2015. FINDINGS:  The patient is status post CABG. There is some scarring in the left lung base. Lungs otherwise clear. No pneumothorax or pleural effusion. Heart size is normal. IMPRESSION: No acute disease. Electronically Signed   By: Inge Rise M.D.   On: 02/24/2016 15:52    EKG:   Orders placed or performed during the hospital encounter of 02/24/16  . ED EKG  . ED EKG    IMPRESSION AND PLAN:   Chest pain, angina and CAD. The patient will be placed for observation. Continue aspirin and Lipitor, nitroglycerin when necessary and Lopressor. Cardiology consult.  Dehydration. Normal saline IV and follow-up BMP. Leukocytosis. Looks like chronic since April. Hypertension. Continue  hypertension medication.  All the records are reviewed and case discussed with ED provider. Management plans discussed with the patient, family and they are in agreement.  CODE STATUS: Full code  TOTAL TIME TAKING CARE OF THIS PATIENT: 48 minutes.    Demetrios Loll M.D on 02/24/2016 at 5:00 PM  Between 7am to 6pm - Pager - [phone]  After 6pm go to [email or web address] - password [email or web address] [facility]  [facility]  Office  [phone]  CC: Primary care physician; Elisabeth Cara, NP

## 2016-02-24 NOTE — Progress Notes (Signed)
Patient admitted from ED for observation following chest pain.  Pt has a complex history of heart disease.  No chest pain currently.  Troponin x2 = 0.03.  Does have chronic pain in his chest and right leg from a CABG 3 years ago.  Is followed in the pain clinic.  Takes Gabapentin and Hydrocodone/tylenol. C/o pain at this time.  Given Hydrocodone as gabapentin not available at this time. Started on O2 at 2L.  NS running at 75 ml/hr.  Anticipate discharge tomorrow.

## 2016-02-24 NOTE — ED Notes (Signed)
Patient brought in by [facility] from home for chest pain that started about 1350 today. Patient states that the pain was all across his chest, denies radiation of the pain or any other assoicated symptoms. Patient states that he did take 1 sublingual Nitro tablet and the pain subsided. Patient currently denies pain. Patient NAD

## 2016-02-24 NOTE — ED Provider Notes (Signed)
[facility] Emergency Department Provider Note   ____________________________________________  Time seen: Approximately 3:07 PM  I have reviewed the triage vital signs and the nursing notes.   HISTORY  Chief Complaint Chest Pain    HPI [name] [name]. is a 60 y.o. male with history of chronic artery disease status post CABG, hypertension, chronic back and chest pain, hyperlipidemia who presents for evaluation of chest pain began suddenly just prior to arrival, initial severe, now resolved with one sublingual nitroglycerin tablet, no other modifying factors. Patient reports that he was sitting down when he developed severe pain across his chest, no radiation to the back, arms or down towards the feet. No associated shortness of breath, lightheadedness, no nausea or vomiting. No recent illness including no fevers, chills, abdominal pain, fevers or chills. Of note, he underwent cardiac catheterization by Dr. Ubaldo Glassing on 02/03/2016 which showed significant occlusive disease not amenable to PCI. This included a 70% mid RCA lesion, 75% proximal RCA lesion, 100% proximal graft lesion, 80% LM lesion, and 65% proximal circumflex lesion. The hope was to medically manage him though consideration of repeat CABG was also discussed.    Past Medical History  Diagnosis Date  . Diverticulitis   . Hypertension   . Gout   . Sleep apnea   . Hyperlipidemia   . Cardiomyopathy, secondary ([place])   . Coronary artery disease   . Hemorrhoids   . Benign esophageal stricture   . Tubular adenoma of colon     Patient Active Problem List   Diagnosis Date Noted  . Keloid skin disorder 01/17/2016  . Costochondritis 01/17/2016  . Long term current use of opiate analgesic 12/23/2015  . Long term prescription opiate use 12/23/2015  . Vitamin D insufficiency 11/21/2015  . Lumbar facet syndrome (Bilateral) (R>L) 11/21/2015  . Lumbar spondylosis 11/21/2015  . Elevated sedimentation rate  11/01/2015  . Chronic low back pain (Bilateral) (R>L) 10/25/2015  . Chronic shoulder pain (Location of Tertiary source of pain) (Right) 10/25/2015  . Chronic hip pain (Left) 10/25/2015  . Chronic pain 10/25/2015  . History of alcoholism ([place]) 10/25/2015  . Opiate use 10/25/2015  . Encounter for therapeutic drug level monitoring 10/25/2015  . Chronic chest wall pain (Location of Primary Source of Pain) (Incisional Midline) (since 04/22/2012) 10/25/2015  . Chronic lower extremity pain (Location of Secondary source of pain) (Bilateral) (R>L) 10/25/2015  . History of MI (myocardial infarction) (January 2014) 10/25/2015  . Encounter for pain management planning 10/25/2015  . Chronic sacroiliac joint pain (Bilateral) (L>R) 10/25/2015  . Neuropathic pain 10/25/2015  . Neurogenic pain 10/25/2015  . Incisional pain 10/25/2015  . HLD (hyperlipidemia) 03/16/2014  . H/O coronary artery bypass surgery 05/12/2012  . Alcohol withdrawal syndrome ([place]) 04/28/2012  . Injury of kidney 04/25/2012  . Obstructive apnea 04/22/2012  . BP (high blood pressure) 04/22/2012  . Arteriosclerosis of coronary artery 04/22/2012  . Apnea, sleep 04/21/2012  . Chronic obstructive pulmonary disease ([place]) 04/21/2012    Past Surgical History  Procedure Laterality Date  . Coronary angioplasty with stent placement      x2  . Cardiac bypass    . Cardiac surgery    . Coronary artery bypass graft    . Colonoscopy with propofol N/A 04/29/2015    Procedure: COLONOSCOPY WITH PROPOFOL;  Surgeon: Hulen Luster, MD;  Location: [facility] ENDOSCOPY;  Service: Gastroenterology;  Laterality: N/A;  . Esophagogastroduodenoscopy (egd) with propofol N/A 04/29/2015    Procedure: ESOPHAGOGASTRODUODENOSCOPY (EGD) WITH PROPOFOL;  Surgeon: Hulen Luster, MD;  Location: [facility] ENDOSCOPY;  Service: Gastroenterology;  Laterality: N/A;  . Septoplasty    . Cardiac catheterization Left 02/03/2016    Procedure: Left Heart Cath and Coronary Angiography;  Surgeon:  Teodoro Spray, MD;  Location: [place] [facility] LAB;  Service: Cardiovascular;  Laterality: Left;    Current Outpatient Rx  Name  Route  Sig  Dispense  Refill  . allopurinol (ZYLOPRIM) 300 MG tablet   Oral   Take 300 mg by mouth daily.         Marland Kitchen amLODipine (NORVASC) 10 MG tablet   Oral   Take 10 mg by mouth daily.         Marland Kitchen aspirin EC 81 MG tablet   Oral   Take 81 mg by mouth daily.         Marland Kitchen atorvastatin (LIPITOR) 40 MG tablet   Oral   Take 40 mg by mouth daily at 6 PM.          . Cholecalciferol (VITAMIN D3) 2000 units capsule      Take 1 capsule (2,000 Units total) by mouth daily.   30 capsule   PRN     Do not place this medication, or any other prescri ...   . gabapentin (NEURONTIN) 300 MG capsule   Oral   Take 1-3 capsules (300-900 mg total) by mouth 4 (four) times daily.   360 capsule   2     Do not place this medication, or any other prescri ...   . hydrochlorothiazide (HYDRODIURIL) 25 MG tablet   Oral   Take 25 mg by mouth daily.         Marland Kitchen HYDROcodone-acetaminophen (NORCO/VICODIN) 5-325 MG tablet   Oral   Take 1 tablet by mouth 2 (two) times daily as needed for severe pain.   60 tablet   0     Do not place this medication, or any other prescri ...   . HYDROcodone-acetaminophen (NORCO/VICODIN) 5-325 MG tablet   Oral   Take 1 tablet by mouth 2 (two) times daily as needed for severe pain.   60 tablet   0     Do not place this medication, or any other prescri ...   . HYDROcodone-acetaminophen (NORCO/VICODIN) 5-325 MG tablet   Oral   Take 1 tablet by mouth 2 (two) times daily as needed for severe pain.   60 tablet   0     Do not place this medication, or any other prescri ...   . ibuprofen (ADVIL,MOTRIN) 800 MG tablet   Oral   Take by mouth.         . isosorbide mononitrate (IMDUR) 30 MG 24 hr tablet   Oral   Take 30 mg by mouth daily.      1   . meloxicam (MOBIC) 15 MG tablet   Oral   Take 1 tablet (15 mg total) by mouth  daily.   30 tablet   0     Do not place this medication, or any other prescri ...   . metoprolol (LOPRESSOR) 50 MG tablet   Oral   Take 50 mg by mouth 3 (three) times daily.         . Multiple Vitamins tablet   Oral   Take 1 tablet by mouth daily.          . Vitamin D, Ergocalciferol, (DRISDOL) 50000 units CAPS capsule      Take 1 capsule (50,000 Units  total) by mouth 2 (two) times a week. X 6 weeks.   12 capsule   0     Do not place this medication, or any other prescri ...     Allergies Review of patient's allergies indicates no known allergies.  Family History  Problem Relation Age of Onset  . Heart disease Mother   . Hypertension Father     Social History Social History  Substance Use Topics  . Smoking status: Former Smoker -- 25 years  . Smokeless tobacco: None  . Alcohol Use: 8.4 oz/week    14 Shots of liquor per week     Comment: 14 shot per week    Review of Systems Constitutional: No fever/chills Eyes: No visual changes. ENT: No sore throat. Cardiovascular: + chest pain. Respiratory: Denies shortness of breath. Gastrointestinal: No abdominal pain.  No nausea, no vomiting.  No diarrhea.  No constipation. Genitourinary: Negative for dysuria. Musculoskeletal: Negative for back pain. Skin: Negative for rash. Neurological: Negative for headaches, focal weakness or numbness.  10-point ROS otherwise negative.  ____________________________________________   PHYSICAL EXAM:  Filed Vitals:   02/24/16 1511 02/24/16 1513  BP: 140/90   Pulse:  85  Temp: 98.4 F (36.9 C)   Resp: 16   Height: 5\' 3"  (1.6 m)   Weight: 165 lb (74.844 kg)   SpO2:  96%    VITAL SIGNS: ED Triage Vitals  Enc Vitals Group     BP --      Pulse --      Resp --      Temp --      Temp src --      SpO2 --      Weight --      Height --      Head Cir --      Peak Flow --      Pain Score --      Pain Loc --      Pain Edu? --      Excl. in [place]? --      Constitutional: Alert and oriented. Well appearing and in no acute distress. Eyes: Conjunctivae are normal. PERRL. EOMI. Head: Atraumatic. Nose: No congestion/rhinnorhea. Mouth/Throat: Mucous membranes are moist.  Oropharynx non-erythematous. Neck: No stridor.  Supple without meningismus. Cardiovascular: Normal rate, regular rhythm. Grossly normal heart sounds.  Good peripheral circulation. Respiratory: Normal respiratory effort.  No retractions. Lungs CTAB. Gastrointestinal: Soft and nontender. No distention. No CVA tenderness. Genitourinary: deferred Musculoskeletal: No lower extremity tenderness nor edema.  No joint effusions. Neurologic:  Normal speech and language. No gross focal neurologic deficits are appreciated.  Skin:  Skin is warm, dry and intact. No rash noted. Psychiatric: Mood and affect are normal. Speech and behavior are normal.  ____________________________________________   LABS (all labs ordered are listed, but only abnormal results are displayed)  Labs Reviewed  CBC WITH DIFFERENTIAL/PLATELET - Abnormal; Notable for the following:    WBC 13.4 (*)    RBC 4.02 (*)    HCT 35.6 (*)    MCHC 37.0 (*)    Neutro Abs 11.2 (*)    All other components within normal limits  COMPREHENSIVE METABOLIC PANEL - Abnormal; Notable for the following:    Chloride 100 (*)    Glucose, Bld 191 (*)    BUN 32 (*)    ALT 65 (*)    All other components within normal limits  TROPONIN I   ____________________________________________  EKG  ED ECG REPORT I, Loura Pardon  A, the attending physician, personally viewed and interpreted this ECG.   Date: 02/24/2016  EKG Time: 15:07  Rate: 81  Rhythm: normal sinus rhythm  Axis: normal  Intervals:none  ST&T Change: No acute ST elevation. ST depressions in leads 2, 3, aVF and V3 are more prominent but were seen on the prior EKG obtained on 12/16/2015.  ____________________________________________  RADIOLOGY  CXR FINDINGS: The  patient is status post CABG. There is some scarring in the left lung base. Lungs otherwise clear. No pneumothorax or pleural effusion. Heart size is normal.  IMPRESSION: No acute disease. ____________________________________________   PROCEDURES  Procedure(s) performed: None  Critical Care performed: No  ____________________________________________   INITIAL IMPRESSION / ASSESSMENT AND PLAN / ED COURSE  Pertinent labs & imaging results that were available during my care of the patient were reviewed by me and considered in my medical decision making (see chart for details). Margaretha Seeds. is a 60 y.o. male with history of chronic artery disease status post CABG, hypertension, hyperlipidemia who presents for evaluation of chest pain began suddenly just prior to arrival, initial severe, now resolved. Currently, he is chest pain-free. On exam, he is very well-appearing and in no acute distress, vital signs stable, he is afebrile. EKG shows ST depressions in the inferior leads which appear chronic though they are more prominent on the EKG today. My concern is for unstable angina given pain at rest resolved with nitroglycerin, certainly he is at risk for ACS. We'll obtain screening labs, chest x-ray, give full dose aspirin, anticipate admission.  ----------------------------------------- 4:31 PM on 02/24/2016 ---------------------------------------- Patient remains chest pain-free. Labs are notable for mild leukocytosis, initial troponin is negative. Chest X ray shows no acute [place] Post primary disease. I discussed the case with Dr. Saralyn Pilar, on call for Dr. Ubaldo Glassing and given concern for unstable angina, he agrees with the plan for admission here at [facility]. Case discussed with the hospitalist, Dr. Bridgett Larsson, for admission at this time.  ____________________________________________   FINAL CLINICAL IMPRESSION(S) / ED DIAGNOSES  Final diagnoses:  Chest pain, unspecified chest pain type       NEW MEDICATIONS STARTED DURING THIS VISIT:  New Prescriptions   No medications on file     Note:  This document was prepared using Dragon voice recognition software and may include unintentional dictation errors.    Joanne Gavel, MD 02/24/16 [phone]

## 2016-02-25 DIAGNOSIS — I25110 Atherosclerotic heart disease of native coronary artery with unstable angina pectoris: Secondary | ICD-10-CM | POA: Diagnosis not present

## 2016-02-25 LAB — CBC
HEMATOCRIT: 35.5 % — AB (ref 40.0–52.0)
Hemoglobin: 12.6 g/dL — ABNORMAL LOW (ref 13.0–18.0)
MCH: 32 pg (ref 26.0–34.0)
MCHC: 35.4 g/dL (ref 32.0–36.0)
MCV: 90.4 fL (ref 80.0–100.0)
PLATELETS: 175 10*3/uL (ref 150–440)
RBC: 3.93 MIL/uL — ABNORMAL LOW (ref 4.40–5.90)
RDW: 14 % (ref 11.5–14.5)
WBC: 11.2 10*3/uL — AB (ref 3.8–10.6)

## 2016-02-25 LAB — BASIC METABOLIC PANEL
ANION GAP: 11 (ref 5–15)
BUN: 35 mg/dL — AB (ref 6–20)
CALCIUM: 8.5 mg/dL — AB (ref 8.9–10.3)
CO2: 24 mmol/L (ref 22–32)
Chloride: 98 mmol/L — ABNORMAL LOW (ref 101–111)
Creatinine, Ser: 1.12 mg/dL (ref 0.61–1.24)
GFR calc Af Amer: >60 mL/min (ref >60)
Glucose, Bld: 227 mg/dL — ABNORMAL HIGH (ref 65–99)
POTASSIUM: 4 mmol/L (ref 3.5–5.1)
SODIUM: 133 mmol/L — AB (ref 135–145)

## 2016-02-25 LAB — TROPONIN I
Troponin I: 0.09 ng/mL — ABNORMAL HIGH (ref <0.031)
Troponin I: <0.03 ng/mL (ref <0.031)

## 2016-02-25 MED ORDER — RANOLAZINE ER 500 MG PO TB12
500.0000 mg | ORAL_TABLET | Freq: Two times a day (BID) | ORAL | Status: DC
  Administered 2016-02-25: 500 mg via ORAL
  Filled 2016-02-25: qty 1

## 2016-02-25 MED ORDER — RANOLAZINE ER 500 MG PO TB12: 500.0000 mg | ORAL_TABLET | Freq: Two times a day (BID) | ORAL | Status: DC

## 2016-02-25 NOTE — Consult Note (Signed)
[facility] Cardiology  CARDIOLOGY CONSULT NOTE  Patient ID: [name] [name]. MRN: [id number] DOB/AGE: 1955-10-16 60 y.o.  Admit date: 02/24/2016 Referring Physician Anselm Jungling Primary Physician [facility] Primary Cardiologist Fath Reason for Consultation Chest pain  HPI: 60 year old gentleman referred for evaluation of chest pain. The patient has known history of coronary artery bypass graft surgery, and coronary stents and one year following bypass surgery. Recently underwent cardiac catheterization by Dr. Ubaldo Glassing on 02/03/2016, which revealed 80% proximal LAD, 65% proximal left circumflex, sequential 75 and 70% stenosis proximal mid RCA, patent LIMA to LAD, patent SVG to distal RCA, occluded SVG to first OM, and occluded SVG to D1, which was not felt to be amenable to redo PCI. The patient was medically managed, and being considered for redo CABG the patient fails medical therapy. Yesterday, the patient had a 15-20 minute episode of chest pain which radiated across his chest. He presented to [facility] emergency room where ECG was nondiagnostic. The patient has ruled out for myocardial infarction with negative troponin. He currently is chest pain-free.  Review of systems complete and found to be negative unless listed above     Past Medical History  Diagnosis Date  . Diverticulitis   . Hypertension   . Gout   . Sleep apnea   . Hyperlipidemia   . Cardiomyopathy, secondary ([place])   . Coronary artery disease   . Hemorrhoids   . Benign esophageal stricture   . Tubular adenoma of colon     Past Surgical History  Procedure Laterality Date  . Coronary angioplasty with stent placement      x2  . Cardiac bypass    . Cardiac surgery    . Coronary artery bypass graft    . Colonoscopy with propofol N/A 04/29/2015    Procedure: COLONOSCOPY WITH PROPOFOL;  Surgeon: Hulen Luster, MD;  Location[facility] ENDOSCOPY;  Service: Gastroenterology;  Laterality: N/A;  . Esophagogastroduodenoscopy (egd) with propofol N/A  04/29/2015    Procedure: ESOPHAGOGASTRODUODENOSCOPY (EGD) WITH PROPOFOL;  Surgeon: Hulen Luster, MD;  Location: [facility] ENDOSCOPY;  Service: Gastroenterology;  Laterality: N/A;  . Septoplasty    . Cardiac catheterization Left 02/03/2016    Procedure: Left Heart Cath and Coronary Angiography;  Surgeon: Teodoro Spray, MD;  Location: [facility] CV LAB;  Service: Cardiovascular;  Laterality: Left;    Facility-administered medications prior to admission  Medication Dose Route Frequency Provider Last Rate Last Dose  . fentaNYL (SUBLIMAZE) injection 25-50 mcg  25-50 mcg Intravenous Q5 min PRN Milinda Pointer, MD      . lactated ringers infusion 1,000 mL  1,000 mL Intravenous Once Milinda Pointer, MD      . lidocaine (PF) (XYLOCAINE) 1 % injection 10 mL  10 mL Other Once Milinda Pointer, MD      . midazolam (VERSED) 5 MG/5ML injection 1-2 mg  1-2 mg Intravenous Q5 min PRN Milinda Pointer, MD      . ropivacaine (PF) 2 mg/ml (0.2%) (NAROPIN) epidural 9 mL  9 mL Other Once Milinda Pointer, MD       Prescriptions prior to admission  Medication Sig Dispense Refill Last Dose  . allopurinol (ZYLOPRIM) 300 MG tablet Take 300 mg by mouth every evening.    02/23/2016 at Unknown time  . amLODipine (NORVASC) 10 MG tablet Take 10 mg by mouth every evening.    02/23/2016 at Unknown time  . aspirin EC 81 MG tablet Take 81 mg by mouth daily.   02/24/2016 at 1000  .  atorvastatin (LIPITOR) 40 MG tablet Take 40 mg by mouth at bedtime.    02/23/2016 at Unknown time  . cholecalciferol (VITAMIN D) 1000 units tablet Take 2,000 Units by mouth daily.   02/24/2016 at Unknown time  . gabapentin (NEURONTIN) 300 MG capsule Take 300-900 mg by mouth 4 (four) times daily. Pt takes one capsule three times daily and three capsules at bedtime.   02/24/2016 at Unknown time  . hydrochlorothiazide (HYDRODIURIL) 25 MG tablet Take 25 mg by mouth daily.   02/24/2016 at Unknown time  . HYDROcodone-acetaminophen (NORCO/VICODIN) 5-325 MG tablet  Take 1 tablet by mouth 2 (two) times daily as needed for severe pain. 60 tablet 0 02/23/2016 at [facility]   . isosorbide mononitrate (IMDUR) 30 MG 24 hr tablet Take 30 mg by mouth daily.  1 02/24/2016 at Unknown time  . metoprolol (LOPRESSOR) 50 MG tablet Take 50 mg by mouth 3 (three) times daily.   02/24/2016 at 1000  . Multiple Vitamin (MULTIVITAMIN WITH MINERALS) TABS tablet Take 1 tablet by mouth every evening.   02/23/2016 at Unknown time  . Potassium 95 MG TABS Take 95 mg by mouth daily.   02/24/2016 at Unknown time  . Cholecalciferol (VITAMIN D3) 2000 units capsule Take 1 capsule (2,000 Units total) by mouth daily. (Patient not taking: Reported on 02/24/2016) 30 capsule PRN Taking   Social History   Social History  . Marital Status: Single    Spouse Name: N/A  . Number of Children: N/A  . Years of Education: N/A   Occupational History  . Not on file.   Social History Main Topics  . Smoking status: Former Smoker -- 25 years  . Smokeless tobacco: Not on file  . Alcohol Use: 8.4 oz/week    14 Shots of liquor per week     Comment: 14 shot per week  . Drug Use: No  . Sexual Activity: Not on file   Other Topics Concern  . Not on file   Social History Narrative    Family History  Problem Relation Age of Onset  . Heart disease Mother   . Hypertension Father       Review of systems complete and found to be negative unless listed above      PHYSICAL EXAM  General: Well developed, well nourished, in no acute distress HEENT:  Normocephalic and atramatic Neck:  No JVD.  Lungs: Clear bilaterally to auscultation and percussion. Heart: HRRR . Normal S1 and S2 without gallops or murmurs.  Abdomen: Bowel sounds are positive, abdomen soft and non-tender  Msk:  Back normal, normal gait. Normal strength and tone for age. Extremities: No clubbing, cyanosis or edema.   Neuro: Alert and oriented X 3. Psych:  Good affect, responds appropriately  Labs:   Lab Results  Component Value  Date   WBC 11.2* 02/25/2016   HGB 12.6* 02/25/2016   HCT 35.5* 02/25/2016   MCV 90.4 02/25/2016   PLT 175 02/25/2016    Recent Labs Lab 02/24/16 1513 02/25/16 0040  NA 135 133*  K 4.1 4.0  CL 100* 98*  CO2 24 24  BUN 32* 35*  CREATININE 1.07 1.12  CALCIUM 9.1 8.5*  PROT 7.5  --   BILITOT 1.2  --   ALKPHOS 69  --   ALT 65*  --   AST 39  --   GLUCOSE 191* 227*   Lab Results  Component Value Date   CKTOTAL 82 05/16/2013   CKMB 1.4 05/16/2013   TROPONINI <  0.03 02/25/2016    Lab Results  Component Value Date   CHOL 181 04/18/2012   Lab Results  Component Value Date   HDL 28* 04/18/2012   Lab Results  Component Value Date   LDLCALC 107* 04/18/2012   Lab Results  Component Value Date   TRIG 230* 04/18/2012   No results found for: CHOLHDL No results found for: LDLDIRECT    Radiology: Dg Chest 2 View  02/24/2016  CLINICAL DATA:  Midsternal chest pain beginning today. History of CABG. EXAM: CHEST  2 VIEW COMPARISON:  PA and lateral chest 01/28/2016 and 03/19/2015. FINDINGS: The patient is status post CABG. There is some scarring in the left lung base. Lungs otherwise clear. No pneumothorax or pleural effusion. Heart size is normal. IMPRESSION: No acute disease. Electronically Signed   By: Inge Rise M.D.   On: 02/24/2016 15:52   Dg Chest 2 View  01/28/2016  CLINICAL DATA:  60 year old male with chest pain. Past history of myocardial infarction. EXAM: CHEST  2 VIEW COMPARISON:  Prior chest x-ray 12/16/2015 FINDINGS: Patient is status post median sternotomy with evidence of prior multivessel CABG including LIMA bypass. Atherosclerotic calcifications are present in the transverse aorta. No pulmonary edema, focal consolidation, pleural effusion or pneumothorax. No acute osseous abnormality. Minimal bronchitic changes are stable. IMPRESSION: No active cardiopulmonary disease. Electronically Signed   By: Jacqulynn Cadet M.D.   On: 01/28/2016 12:47    EKG: Normal sinus  rhythm with nonspecific ST abnormalities inferolaterally  ASSESSMENT AND PLAN:   1. Chest pain, negative troponin, without recurrence, status post CABG, multiple coronary stents, not felt to be amenable to repeat PCI, being considered for redo CABG  Recommendations  1. Agree with overall current therapy 2. Defer full dose anticoagulation 3. Add Ranexa 500 mg twice a day 4. If patient does well, ambulates without difficulty, may consider discharge later today, follow-up with Dr.Fath next week    Signed: Nehemie Casserly MD,PhD, [facility] 02/25/2016, 8:28 AM

## 2016-02-25 NOTE — Care Management Obs Status (Signed)
[place] NOTIFICATION   Patient Details  Name: [name] [name]. MRN: [id number] Date of Birth: 12-Mar-1956   Medicare Observation Status Notification Given:  No (discharge orders written < 24 hours)    Ival Bible, RN 02/25/2016, 7:26 PM

## 2016-02-25 NOTE — Discharge Summary (Signed)
[place] [facility] [place] NAME: [name] [name]    MR#:  [id number]  DATE OF BIRTH:  13-Sep-1955  DATE OF ADMISSION:  02/24/2016 ADMITTING PHYSICIAN: Demetrios Loll, MD  DATE OF DISCHARGE: 02/25/2016  PRIMARY CARE PHYSICIAN: SPENCER, Welcome, NP    ADMISSION DIAGNOSIS:  Chest pain, unspecified chest pain type [R07.9]  DISCHARGE DIAGNOSIS:  Principal Problem:   Unstable angina ([place])   SECONDARY DIAGNOSIS:   Past Medical History  Diagnosis Date  . Diverticulitis   . Hypertension   . Gout   . Sleep apnea   . Hyperlipidemia   . Cardiomyopathy, secondary ([place])   . Coronary artery disease   . Hemorrhoids   . Benign esophageal stricture   . Tubular adenoma of colon     HOSPITAL COURSE:    Monitored on telemetry. Troponin remained stable on further follow ups.  cardiologist saw the pt in hospital. And suggested to add renexa.  No further work ups suggested. Pt is already known to have Multi vessel CAD- and he was pain free in hospital.  DISCHARGE CONDITIONS:   stable  CONSULTS OBTAINED:  Treatment Team:  Isaias Cowman, MD  DRUG ALLERGIES:  No Known Allergies  DISCHARGE MEDICATIONS:   Current Discharge Medication List    START taking these medications   Details  ranolazine (RANEXA) 500 MG 12 hr tablet Take 1 tablet (500 mg total) by mouth 2 (two) times daily. Qty: 60 tablet, Refills: 0      CONTINUE these medications which have NOT CHANGED   Details  allopurinol (ZYLOPRIM) 300 MG tablet Take 300 mg by mouth every evening.     amLODipine (NORVASC) 10 MG tablet Take 10 mg by mouth every evening.     aspirin EC 81 MG tablet Take 81 mg by mouth daily.    atorvastatin (LIPITOR) 40 MG tablet Take 40 mg by mouth at bedtime.     cholecalciferol (VITAMIN D) 1000 units tablet Take 2,000 Units by mouth daily.    gabapentin (NEURONTIN) 300 MG capsule Take 300-900 mg by mouth 4 (four) times daily. Pt takes one capsule three  times daily and three capsules at bedtime.    hydrochlorothiazide (HYDRODIURIL) 25 MG tablet Take 25 mg by mouth daily.    HYDROcodone-acetaminophen (NORCO/VICODIN) 5-325 MG tablet Take 1 tablet by mouth 2 (two) times daily as needed for severe pain. Qty: 60 tablet, Refills: 0   Associated Diagnoses: Chronic pain    isosorbide mononitrate (IMDUR) 30 MG 24 hr tablet Take 30 mg by mouth daily. Refills: 1    metoprolol (LOPRESSOR) 50 MG tablet Take 50 mg by mouth 3 (three) times daily.    Multiple Vitamin (MULTIVITAMIN WITH MINERALS) TABS tablet Take 1 tablet by mouth every evening.    Potassium 95 MG TABS Take 95 mg by mouth daily.    Cholecalciferol (VITAMIN D3) 2000 units capsule Take 1 capsule (2,000 Units total) by mouth daily. Qty: 30 capsule, Refills: PRN   Associated Diagnoses: Vitamin D insufficiency         DISCHARGE INSTRUCTIONS:    Follow with Dr. Ubaldo Glassing in 1 week.  If you experience worsening of your admission symptoms, develop shortness of breath, life threatening emergency, suicidal or homicidal thoughts you must seek medical attention immediately by calling 911 or calling your MD immediately  if symptoms less severe.  You Must read complete instructions/literature along with all the possible adverse reactions/side effects for all the Medicines you take and that have  been prescribed to you. Take any new Medicines after you have completely understood and accept all the possible adverse reactions/side effects.   Please note  You were cared for by a hospitalist during your hospital stay. If you have any questions about your discharge medications or the care you received while you were in the hospital after you are discharged, you can call the unit and asked to speak with the hospitalist on call if the hospitalist that took care of you is not available. Once you are discharged, your primary care physician will handle any further medical issues. Please note that NO REFILLS  for any discharge medications will be authorized once you are discharged, as it is imperative that you return to your primary care physician (or establish a relationship with a primary care physician if you do not have one) for your aftercare needs so that they can reassess your need for medications and monitor your lab values.    Today   CHIEF COMPLAINT:   Chief Complaint  Patient presents with  . Chest Pain    HISTORY OF PRESENT ILLNESS:  [name] [name]  is a 60 y.o. male with a known history of CAD status post CABG, chronic chest pain and back pain, hypertension, sleep apnea and hyperlipidemia. The patient presented to the ED with chest pain today, which in both side, pressure-like, lasted about 20 minutes without radiation, relieved by nitroglycerin. He denies any nausea, vomiting or diaphoresis. He got aspirin 324 mg 1 dose in the ED. Of note, he underwent cardiac catheterization by Dr. Ubaldo Glassing on 02/03/2016 which showed significant occlusive disease not amenable to PCI. This included a 70% mid RCA lesion, 75% proximal RCA lesion, 100% proximal graft lesion, 80% LM lesion, and 65% proximal circumflex lesion. Dr. Saralyn Pilar suggested medical treatment.   VITAL SIGNS:  Blood pressure 140/67, pulse 75, temperature 98.2 F (36.8 C), temperature source Oral, resp. rate 18, height 5\' 3"  (1.6 m), weight 74.844 kg (165 lb), SpO2 99 %.  I/O:   Intake/Output Summary (Last 24 hours) at 02/25/16 1357 Last data filed at 02/25/16 1330  Gross per 24 hour  Intake   1500 ml  Output      0 ml  Net   1500 ml    PHYSICAL EXAMINATION:   GENERAL: 60 y.o.-year-old patient lying in the bed with no acute distress.  EYES: Pupils equal, round, reactive to light and accommodation. No scleral icterus. Extraocular muscles intact.  HEENT: Head atraumatic, normocephalic. Oropharynx and nasopharynx clear.  NECK: Supple, no jugular venous distention. No thyroid enlargement, no tenderness.  LUNGS: Normal breath  sounds bilaterally, no wheezing, rales,rhonchi or crepitation. No use of accessory muscles of respiration. Tenderness on chest wall on palpation. CARDIOVASCULAR: S1, S2 normal. No murmurs, rubs, or gallops.  ABDOMEN: Soft, nontender, nondistended. Bowel sounds present. No organomegaly or mass.  EXTREMITIES: No pedal edema, cyanosis, or clubbing.  NEUROLOGIC: Cranial nerves II through XII are intact. Muscle strength 5/5 in all extremities. Sensation intact. Gait not checked.  PSYCHIATRIC: The patient is alert and oriented x 3.  SKIN: No obvious rash, lesion, or ulcer.   DATA REVIEW:   CBC  Recent Labs Lab 02/25/16 0040  WBC 11.2*  HGB 12.6*  HCT 35.5*  PLT 175    Chemistries   Recent Labs Lab 02/24/16 1513 02/25/16 0040  NA 135 133*  K 4.1 4.0  CL 100* 98*  CO2 24 24  GLUCOSE 191* 227*  BUN 32* 35*  CREATININE 1.07 1.12  CALCIUM 9.1 8.5*  AST 39  --   ALT 65*  --   ALKPHOS 69  --   BILITOT 1.2  --     Cardiac Enzymes  Recent Labs Lab 02/25/16 0652  TROPONINI <0.03    Microbiology Results  No results found for this or any previous visit.  RADIOLOGY:  Dg Chest 2 View  02/24/2016  CLINICAL DATA:  Midsternal chest pain beginning today. History of CABG. EXAM: CHEST  2 VIEW COMPARISON:  PA and lateral chest 01/28/2016 and 03/19/2015. FINDINGS: The patient is status post CABG. There is some scarring in the left lung base. Lungs otherwise clear. No pneumothorax or pleural effusion. Heart size is normal. IMPRESSION: No acute disease. Electronically Signed   By: Inge Rise M.D.   On: 02/24/2016 15:52    EKG:   Orders placed or performed during the hospital encounter of 02/24/16  . ED EKG  . ED EKG      Management plans discussed with the patient, family and they are in agreement.  CODE STATUS:     Code Status Orders        Start     Ordered   02/24/16 1755  Full code   Continuous     02/24/16 1754    Code Status History    Date Active  Date Inactive Code Status Order ID Comments User Context   This patient has a current code status but no historical code status.      TOTAL TIME TAKING CARE OF THIS PATIENT: 35 minutes.    Vaughan Basta M.D on 02/25/2016 at 1:57 PM  Between 7am to 6pm - Pager - [phone]  After 6pm go to [email or web address] - password [email or web address] [facility]  Office  [phone]  CC: Primary care physician; Elisabeth Cara, NP   Note: This dictation was prepared with Dragon dictation along with smaller phrase technology. Any transcriptional errors that result from this process are unintentional.

## 2016-02-25 NOTE — Progress Notes (Signed)
Third Troponin was 0.09, Dr. Claria Dice notified with a new order for repeat Troponin at 0700. Patient denied any chest pain. Will continue to monitor.

## 2016-02-25 NOTE — Progress Notes (Signed)
Order received to d/c pt to home. Discharge instructions and prescription given to pt with verbal acknowledgment of understanding. IV and tele removed. Pt escorted off unit via wheelchair by nursing.

## 2016-02-28 ENCOUNTER — Encounter: Payer: Self-pay | Admitting: Occupational Medicine

## 2016-02-28 ENCOUNTER — Emergency Department: Payer: Medicare Other

## 2016-02-28 ENCOUNTER — Observation Stay
Admission: EM | Admit: 2016-02-28 | Discharge: 2016-02-29 | Disposition: A | Discharge status: Home / Self Care | Payer: Medicare Other | Attending: Internal Medicine | Admitting: Internal Medicine

## 2016-02-28 DIAGNOSIS — I428 Other cardiomyopathies: Secondary | ICD-10-CM | POA: Insufficient documentation

## 2016-02-28 DIAGNOSIS — G8929 Other chronic pain: Secondary | ICD-10-CM | POA: Insufficient documentation

## 2016-02-28 DIAGNOSIS — I10 Essential (primary) hypertension: Secondary | ICD-10-CM | POA: Insufficient documentation

## 2016-02-28 DIAGNOSIS — I700 Atherosclerosis of aorta: Secondary | ICD-10-CM | POA: Diagnosis not present

## 2016-02-28 DIAGNOSIS — E559 Vitamin D deficiency, unspecified: Secondary | ICD-10-CM | POA: Insufficient documentation

## 2016-02-28 DIAGNOSIS — G4730 Sleep apnea, unspecified: Secondary | ICD-10-CM | POA: Diagnosis not present

## 2016-02-28 DIAGNOSIS — Z79891 Long term (current) use of opiate analgesic: Secondary | ICD-10-CM | POA: Insufficient documentation

## 2016-02-28 DIAGNOSIS — L910 Hypertrophic scar: Secondary | ICD-10-CM | POA: Diagnosis not present

## 2016-02-28 DIAGNOSIS — I25110 Atherosclerotic heart disease of native coronary artery with unstable angina pectoris: Secondary | ICD-10-CM | POA: Diagnosis not present

## 2016-02-28 DIAGNOSIS — E785 Hyperlipidemia, unspecified: Secondary | ICD-10-CM | POA: Diagnosis not present

## 2016-02-28 DIAGNOSIS — N179 Acute kidney failure, unspecified: Secondary | ICD-10-CM | POA: Diagnosis not present

## 2016-02-28 DIAGNOSIS — G629 Polyneuropathy, unspecified: Secondary | ICD-10-CM | POA: Insufficient documentation

## 2016-02-28 DIAGNOSIS — I252 Old myocardial infarction: Secondary | ICD-10-CM | POA: Diagnosis not present

## 2016-02-28 DIAGNOSIS — Z955 Presence of coronary angioplasty implant and graft: Secondary | ICD-10-CM | POA: Insufficient documentation

## 2016-02-28 DIAGNOSIS — Z951 Presence of aortocoronary bypass graft: Secondary | ICD-10-CM | POA: Insufficient documentation

## 2016-02-28 DIAGNOSIS — Z87891 Personal history of nicotine dependence: Secondary | ICD-10-CM | POA: Diagnosis not present

## 2016-02-28 DIAGNOSIS — R079 Chest pain, unspecified: Secondary | ICD-10-CM | POA: Diagnosis present

## 2016-02-28 DIAGNOSIS — Z7982 Long term (current) use of aspirin: Secondary | ICD-10-CM | POA: Insufficient documentation

## 2016-02-28 DIAGNOSIS — Z8249 Family history of ischemic heart disease and other diseases of the circulatory system: Secondary | ICD-10-CM | POA: Diagnosis not present

## 2016-02-28 DIAGNOSIS — M109 Gout, unspecified: Secondary | ICD-10-CM | POA: Insufficient documentation

## 2016-02-28 DIAGNOSIS — N289 Disorder of kidney and ureter, unspecified: Secondary | ICD-10-CM

## 2016-02-28 LAB — CBC
HCT: 38.3 % — ABNORMAL LOW (ref 40.0–52.0)
Hemoglobin: 13.7 g/dL (ref 13.0–18.0)
MCH: 32 pg (ref 26.0–34.0)
MCHC: 35.8 g/dL (ref 32.0–36.0)
MCV: 89.4 fL (ref 80.0–100.0)
Platelets: 205 10*3/uL (ref 150–440)
RBC: 4.28 MIL/uL — ABNORMAL LOW (ref 4.40–5.90)
RDW: 14.7 % — AB (ref 11.5–14.5)
WBC: 15.8 10*3/uL — AB (ref 3.8–10.6)

## 2016-02-28 LAB — BASIC METABOLIC PANEL
ANION GAP: 13 (ref 5–15)
BUN: 56 mg/dL — AB (ref 6–20)
CALCIUM: 8.7 mg/dL — AB (ref 8.9–10.3)
CO2: 20 mmol/L — ABNORMAL LOW (ref 22–32)
Chloride: 99 mmol/L — ABNORMAL LOW (ref 101–111)
Creatinine, Ser: 2.22 mg/dL — ABNORMAL HIGH (ref 0.61–1.24)
GFR calc Af Amer: 35 mL/min — ABNORMAL LOW (ref >60)
GFR, EST NON AFRICAN AMERICAN: 30 mL/min — AB (ref >60)
GLUCOSE: 239 mg/dL — AB (ref 65–99)
Potassium: 3.8 mmol/L (ref 3.5–5.1)
SODIUM: 132 mmol/L — AB (ref 135–145)

## 2016-02-28 LAB — TROPONIN I

## 2016-02-28 MED ORDER — SODIUM CHLORIDE 0.9 % IV BOLUS (SEPSIS)
1000.0000 mL | Freq: Once | INTRAVENOUS | Status: AC
  Administered 2016-02-28: 1000 mL via INTRAVENOUS

## 2016-02-28 NOTE — ED Notes (Signed)
Pt was playing bridge started having substernal chest pain left upper chest no radiation tighntess pressure. Denies sob nausea. Pt took 2 nitro sl pain went away started coming back. Pt didn't take asa. Pt hx cabg 2 stents in 2013. Pt states now now the chest pain isnt there that was didffernet tonight. Pt has chronic chest pain from operation in 2013.

## 2016-02-29 ENCOUNTER — Encounter: Payer: Self-pay | Admitting: Internal Medicine

## 2016-02-29 DIAGNOSIS — I25110 Atherosclerotic heart disease of native coronary artery with unstable angina pectoris: Secondary | ICD-10-CM | POA: Diagnosis not present

## 2016-02-29 DIAGNOSIS — R079 Chest pain, unspecified: Secondary | ICD-10-CM | POA: Diagnosis present

## 2016-02-29 LAB — TROPONIN I: Troponin I: <0.03 ng/mL (ref <0.03)

## 2016-02-29 LAB — BASIC METABOLIC PANEL
Anion gap: 9 (ref 5–15)
BUN: 35 mg/dL — AB (ref 6–20)
CALCIUM: 8.1 mg/dL — AB (ref 8.9–10.3)
CHLORIDE: 100 mmol/L — AB (ref 101–111)
CO2: 22 mmol/L (ref 22–32)
CREATININE: 1.26 mg/dL — AB (ref 0.61–1.24)
GFR calc non Af Amer: 59 mL/min — ABNORMAL LOW (ref >60)
Glucose, Bld: 201 mg/dL — ABNORMAL HIGH (ref 65–99)
Potassium: 3.7 mmol/L (ref 3.5–5.1)
SODIUM: 131 mmol/L — AB (ref 135–145)

## 2016-02-29 LAB — LIPID PANEL
CHOLESTEROL: 99 mg/dL (ref 0–200)
HDL: 30 mg/dL — ABNORMAL LOW (ref >40)
LDL CALC: 52 mg/dL (ref 0–99)
Total CHOL/HDL Ratio: 3.3 RATIO
Triglycerides: 83 mg/dL (ref <150)
VLDL: 17 mg/dL (ref 0–40)

## 2016-02-29 LAB — CBC
HEMATOCRIT: 36.8 % — AB (ref 40.0–52.0)
HEMOGLOBIN: 13 g/dL (ref 13.0–18.0)
MCH: 31.8 pg (ref 26.0–34.0)
MCHC: 35.4 g/dL (ref 32.0–36.0)
MCV: 89.9 fL (ref 80.0–100.0)
Platelets: 143 10*3/uL — ABNORMAL LOW (ref 150–440)
RBC: 4.1 MIL/uL — AB (ref 4.40–5.90)
RDW: 14.4 % (ref 11.5–14.5)
WBC: 10.7 10*3/uL — ABNORMAL HIGH (ref 3.8–10.6)

## 2016-02-29 MED ORDER — RANOLAZINE ER 500 MG PO TB12
500.0000 mg | ORAL_TABLET | Freq: Two times a day (BID) | ORAL | Status: DC
  Administered 2016-02-29: 500 mg via ORAL
  Filled 2016-02-29: qty 1

## 2016-02-29 MED ORDER — ADULT MULTIVITAMIN W/MINERALS CH: 1.0000 | ORAL_TABLET | Freq: Every evening | ORAL | Status: DC

## 2016-02-29 MED ORDER — SODIUM CHLORIDE 0.9 % IV SOLN
INTRAVENOUS | Status: DC
  Administered 2016-02-29: 04:00:00 via INTRAVENOUS

## 2016-02-29 MED ORDER — ACETAMINOPHEN 325 MG PO TABS: 650.0000 mg | ORAL_TABLET | ORAL | Status: DC | PRN

## 2016-02-29 MED ORDER — METOPROLOL TARTRATE 25 MG PO TABS
25.0000 mg | ORAL_TABLET | Freq: Two times a day (BID) | ORAL | Status: DC
  Administered 2016-02-29: 25 mg via ORAL
  Filled 2016-02-29: qty 1

## 2016-02-29 MED ORDER — ASPIRIN 81 MG PO CHEW
324.0000 mg | CHEWABLE_TABLET | ORAL | Status: AC
  Administered 2016-02-29: 324 mg via ORAL
  Filled 2016-02-29: qty 4

## 2016-02-29 MED ORDER — ASPIRIN 300 MG RE SUPP: 300.0000 mg | RECTAL | Status: AC

## 2016-02-29 MED ORDER — LOSARTAN POTASSIUM 25 MG PO TABS: 25.0000 mg | ORAL_TABLET | Freq: Every day | ORAL | Status: DC

## 2016-02-29 MED ORDER — GABAPENTIN 300 MG PO CAPS: 300.0000 mg | ORAL_CAPSULE | Freq: Four times a day (QID) | ORAL | Status: DC

## 2016-02-29 MED ORDER — AMLODIPINE BESYLATE 10 MG PO TABS
10.0000 mg | ORAL_TABLET | Freq: Every evening | ORAL | Status: DC
Start: 2016-02-29 — End: 2016-02-29

## 2016-02-29 MED ORDER — RANOLAZINE ER 1000 MG PO TB12: 1000.0000 mg | ORAL_TABLET | Freq: Two times a day (BID) | ORAL | Status: DC

## 2016-02-29 MED ORDER — GABAPENTIN 300 MG PO CAPS
300.0000 mg | ORAL_CAPSULE | Freq: Three times a day (TID) | ORAL | Status: DC
  Administered 2016-02-29 (×2): 300 mg via ORAL
  Filled 2016-02-29 (×2): qty 1

## 2016-02-29 MED ORDER — NITROGLYCERIN 0.4 MG SL SUBL: 0.4000 mg | SUBLINGUAL_TABLET | SUBLINGUAL | Status: DC | PRN

## 2016-02-29 MED ORDER — ONDANSETRON HCL 4 MG/2ML IJ SOLN: 4.0000 mg | Freq: Four times a day (QID) | INTRAMUSCULAR | Status: DC | PRN

## 2016-02-29 MED ORDER — ASPIRIN EC 81 MG PO TBEC: 81.0000 mg | DELAYED_RELEASE_TABLET | Freq: Every day | ORAL | Status: DC

## 2016-02-29 MED ORDER — HEPARIN SODIUM (PORCINE) 5000 UNIT/ML IJ SOLN
5000.0000 [IU] | Freq: Three times a day (TID) | INTRAMUSCULAR | Status: DC
  Administered 2016-02-29: 5000 [IU] via SUBCUTANEOUS
  Filled 2016-02-29: qty 1

## 2016-02-29 MED ORDER — ATORVASTATIN CALCIUM 20 MG PO TABS: 40.0000 mg | ORAL_TABLET | Freq: Every day | ORAL | Status: DC

## 2016-02-29 MED ORDER — GABAPENTIN 300 MG PO CAPS: 900.0000 mg | ORAL_CAPSULE | Freq: Every day | ORAL | Status: DC

## 2016-02-29 MED ORDER — HYDROCODONE-ACETAMINOPHEN 5-325 MG PO TABS
1.0000 | ORAL_TABLET | Freq: Two times a day (BID) | ORAL | Status: DC | PRN
  Administered 2016-02-29: 1 via ORAL
  Filled 2016-02-29: qty 1

## 2016-02-29 MED ORDER — VITAMIN D 1000 UNITS PO TABS
2000.0000 [IU] | ORAL_TABLET | Freq: Every day | ORAL | Status: DC
  Administered 2016-02-29: 2000 [IU] via ORAL
  Filled 2016-02-29: qty 2

## 2016-02-29 MED ORDER — ISOSORBIDE MONONITRATE ER 30 MG PO TB24
30.0000 mg | ORAL_TABLET | Freq: Every day | ORAL | Status: DC
  Administered 2016-02-29: 30 mg via ORAL
  Filled 2016-02-29: qty 1

## 2016-02-29 NOTE — Progress Notes (Signed)
Discharge instructions given to patient. Instructed on new ranexa dose. Given education on chest pain. No questions at this time. Will follow up with Dr. Ubaldo Glassing. Tele and IV removed. Ride is downstairs waiting for him now.

## 2016-02-29 NOTE — Discharge Summary (Signed)
[place] at [place] NAME: [name] [name]    MR#:  [id number]  DATE OF BIRTH:  [birth year]  DATE OF ADMISSION:  02/28/2016 ADMITTING PHYSICIAN: Saundra Shelling, MD  DATE OF DISCHARGE: 02/29/16  PRIMARY CARE PHYSICIAN: SPENCER, NICOLE, NP    ADMISSION DIAGNOSIS:  Renal insufficiency [N28.9] Chest pain syndrome [R07.9]  DISCHARGE DIAGNOSIS:  Active Problems:   Chest pain   SECONDARY DIAGNOSIS:   Past Medical History  Diagnosis Date  . Diverticulitis   . Hypertension   . Gout   . Sleep apnea   . Hyperlipidemia   . Cardiomyopathy, secondary ([place])   . Coronary artery disease   . Hemorrhoids   . Benign esophageal stricture   . Tubular adenoma of colon     HOSPITAL COURSE:   60 y/o Male with history of hypertension, CAD status post CABG in 2013, sleep apnea, hyperlipidemia, chronic angina presents to the hospital secondary to chest pain  #1 chest pain-chronic unstable angina-CABG in 2013. Repeat cardiac catheterization 4 weeks ago showing three-vessel disease with a patent saphenous vein graft to PDA, patent saphenous vein graft to a diagonal/ramus intermedius as well as an occluded free LIMA to the LAD and an occluded saphenous vein graft to the circumflex.  -Unable to intervene through PCI due to difficult nature of diffuse disease. Seen by cardiology this admission. -Ruled out for MI. Troponins are negative 3. -Already on optimal medical management. His Ranexa dose has been increased. -Cardiothoracic surgeon at [facility] has been consulted who recommended outpatient CT angiogram to see the right internal mammary artery, if patent, bypass surgery will be reattempted. -Continue all cardiac medications.  #2 hypertension-continue Norvasc, hydrochlorothiazide, losartan, metoprolol and Imdur  #3 neuropathy-continue gabapentin  #4 acute renal failure-prerenal on admission. Improved with IV fluids.  Patient's chest pain is back to  baseline. He has some sensitive tenderness from his prior CABG scar. Per cardiology recommendations, stable and being discharged home.  DISCHARGE CONDITIONS:   Guarded  CONSULTS OBTAINED:  Treatment Team:  Teodoro Spray, MD  DRUG ALLERGIES:  No Known Allergies  DISCHARGE MEDICATIONS:   Current Discharge Medication List    CONTINUE these medications which have CHANGED   Details  ranolazine (RANEXA) 1000 MG SR tablet Take 1 tablet (1,000 mg total) by mouth 2 (two) times daily. Qty: 60 tablet, Refills: 2      CONTINUE these medications which have NOT CHANGED   Details  allopurinol (ZYLOPRIM) 300 MG tablet Take 300 mg by mouth every evening.     amLODipine (NORVASC) 10 MG tablet Take 10 mg by mouth every evening.     aspirin EC 81 MG tablet Take 81 mg by mouth daily.    atorvastatin (LIPITOR) 40 MG tablet Take 40 mg by mouth at bedtime.     cholecalciferol (VITAMIN D) 1000 units tablet Take 2,000 Units by mouth daily.    gabapentin (NEURONTIN) 300 MG capsule Take 300-900 mg by mouth 4 (four) times daily. Pt takes one capsule three times daily and three capsules at bedtime.    hydrochlorothiazide (HYDRODIURIL) 25 MG tablet Take 25 mg by mouth daily.    HYDROcodone-acetaminophen (NORCO/VICODIN) 5-325 MG tablet Take 1 tablet by mouth 2 (two) times daily as needed for severe pain. Qty: 60 tablet, Refills: 0   Associated Diagnoses: Chronic pain    isosorbide mononitrate (IMDUR) 30 MG 24 hr tablet Take 30 mg by mouth daily. Refills: 1    losartan (COZAAR) 25 MG tablet  Take 25 mg by mouth at bedtime.    metoprolol (LOPRESSOR) 50 MG tablet Take 50 mg by mouth 3 (three) times daily.    Multiple Vitamin (MULTIVITAMIN WITH MINERALS) TABS tablet Take 1 tablet by mouth every evening.    Potassium 95 MG TABS Take 95 mg by mouth daily.    Cholecalciferol (VITAMIN D3) 2000 units capsule Take 1 capsule (2,000 Units total) by mouth daily. Qty: 30 capsule, Refills: PRN   Associated  Diagnoses: Vitamin D insufficiency         DISCHARGE INSTRUCTIONS:   1. PCP f/u in 1-2 weeks 2. Cardiology f/u in 1 week for CTA for RIMA  If you experience worsening of your admission symptoms, develop shortness of breath, life threatening emergency, suicidal or homicidal thoughts you must seek medical attention immediately by calling 911 or calling your MD immediately  if symptoms less severe.  You Must read complete instructions/literature along with all the possible adverse reactions/side effects for all the Medicines you take and that have been prescribed to you. Take any new Medicines after you have completely understood and accept all the possible adverse reactions/side effects.   Please note  You were cared for by a hospitalist during your hospital stay. If you have any questions about your discharge medications or the care you received while you were in the hospital after you are discharged, you can call the unit and asked to speak with the hospitalist on call if the hospitalist that took care of you is not available. Once you are discharged, your primary care physician will handle any further medical issues. Please note that NO REFILLS for any discharge medications will be authorized once you are discharged, as it is imperative that you return to your primary care physician (or establish a relationship with a primary care physician if you do not have one) for your aftercare needs so that they can reassess your need for medications and monitor your lab values.    Today   CHIEF COMPLAINT:   Chief Complaint  Patient presents with  . Chest Pain    substernal to left side    VITAL SIGNS:  Blood pressure 133/76, pulse 84, temperature 99 F (37.2 C), temperature source Oral, resp. rate 16, height 5\' 3"  (1.6 m), weight 74.844 kg (165 lb), SpO2 97 %.  I/O:   Intake/Output Summary (Last 24 hours) at 02/29/16 1637 Last data filed at 02/29/16 1319  Gross per 24 hour  Intake  666.67 ml  Output   1975 ml  Net -1308.33 ml    PHYSICAL EXAMINATION:   Physical Exam  GENERAL:  60 y.o.-year-old patient lying in the bed with no acute distress.  EYES: Pupils equal, round, reactive to light and accommodation. No scleral icterus. Extraocular muscles intact.  HEENT: Head atraumatic, normocephalic. Oropharynx and nasopharynx clear.  NECK:  Supple, no jugular venous distention. No thyroid enlargement, no tenderness.  LUNGS: Normal breath sounds bilaterally, no wheezing, rales,rhonchi or crepitation. No use of accessory muscles of respiration.  CARDIOVASCULAR: S1, S2 normal. No murmurs, rubs, or gallops. CABG scar with keloid formation ABDOMEN: Soft, non-tender, non-distended. Bowel sounds present. No organomegaly or mass.  EXTREMITIES: No pedal edema, cyanosis, or clubbing.  NEUROLOGIC: Cranial nerves II through XII are intact. Muscle strength 5/5 in all extremities. Sensation intact. Gait not checked.  PSYCHIATRIC: The patient is alert and oriented x 3.  SKIN: No obvious rash, lesion, or ulcer.   DATA REVIEW:   CBC  Recent Labs Lab 02/29/16 [phone]  WBC 10.7*  HGB 13.0  HCT 36.8*  PLT 143*    Chemistries   Recent Labs Lab 02/24/16 1513  02/29/16 0517  NA 135  < > 131*  K 4.1  < > 3.7  CL 100*  < > 100*  CO2 24  < > 22  GLUCOSE 191*  < > 201*  BUN 32*  < > 35*  CREATININE 1.07  < > 1.26*  CALCIUM 9.1  < > 8.1*  AST 39  --   --   ALT 65*  --   --   ALKPHOS 69  --   --   BILITOT 1.2  --   --   < > = values in this interval not displayed.  Cardiac Enzymes  Recent Labs Lab 02/29/16 Belt <0.03    Microbiology Results  No results found for this or any previous visit.  RADIOLOGY:  Dg Chest 2 View  02/28/2016  CLINICAL DATA:  Substernal chest pain EXAM: CHEST  2 VIEW COMPARISON:  02/24/2016 FINDINGS: Cardiac shadow is within normal limits. Postsurgical changes are noted. Aortic calcifications are again seen and stable. The lungs are  well aerated bilaterally. Degenerative changes of the thoracic spine are noted. IMPRESSION: Aortic atherosclerosis. No acute abnormality noted. Electronically Signed   By: Inez Catalina M.D.   On: 02/28/2016 20:41    EKG:   Orders placed or performed during the hospital encounter of 02/28/16  . EKG 12-Lead  . EKG 12-Lead  . ED EKG within 10 minutes  . ED EKG within 10 minutes      Management plans discussed with the patient, family and they are in agreement.  CODE STATUS:     Code Status Orders        Start     Ordered   02/29/16 0407  Full code   Continuous     02/29/16 0406    Code Status History    Date Active Date Inactive Code Status Order ID Comments User Context   02/24/2016  5:54 PM 02/25/2016  6:04 PM Full Code [id number]  Demetrios Loll, MD Inpatient      TOTAL TIME TAKING CARE OF THIS PATIENT: 37 minutes.    Gladstone Lighter M.D on 02/29/2016 at 4:37 PM  Between 7am to 6pm - Pager - [phone]  After 6pm go to [email or web address] - password [email or web address] [facility]  [place] [facility]  Office  [phone]  CC: Primary care physician; Elisabeth Cara, NP

## 2016-02-29 NOTE — H&P (Signed)
[place] at [place] NAME: [name] [name]    MR#:  [id number]  DATE OF BIRTH:  01-28-1956  DATE OF ADMISSION:  02/28/2016  PRIMARY CARE PHYSICIAN: Elisabeth Cara, NP   REQUESTING/REFERRING PHYSICIAN:   CHIEF COMPLAINT:   Chief Complaint  Patient presents with  . Chest Pain    substernal to left side    HISTORY OF PRESENT ILLNESS: [name] [name]  is a 60 y.o. male with a known history of Hypertension, gout, sleep apnea, hyperlipidemia, cardiopathy, coronary artery disease presented to the emergency room with chest pain. Chest pain started around 7 PM last evening. Patient took 2 tablets of nitroglycerin for chest pain. The chest pain is the sharp in nature 5 out of 10 on a scale of 1-10. Patient was playing bridge at home when he experienced the chest pain. No complaints of shortness of breath. No history of orthopnea. No history of proximal nocturnal dyspnea. Had history of CABG in 2013. 2 sets of troponin have been negative. During the workup in the emergency room patient's creatinine is slightly elevated. Hospitalist service was consulted for further care of the patient.  PAST MEDICAL HISTORY:   Past Medical History  Diagnosis Date  . Diverticulitis   . Hypertension   . Gout   . Sleep apnea   . Hyperlipidemia   . Cardiomyopathy, secondary ([place])   . Coronary artery disease   . Hemorrhoids   . Benign esophageal stricture   . Tubular adenoma of colon     PAST SURGICAL HISTORY: Past Surgical History  Procedure Laterality Date  . Coronary angioplasty with stent placement      x2  . Cardiac bypass    . Cardiac surgery    . Coronary artery bypass graft    . Colonoscopy with propofol N/A 04/29/2015    Procedure: COLONOSCOPY WITH PROPOFOL;  Surgeon: Hulen Luster, MD;  Location[facility] ENDOSCOPY;  Service: Gastroenterology;  Laterality: N/A;  . Esophagogastroduodenoscopy (egd) with propofol N/A 04/29/2015    Procedure:  ESOPHAGOGASTRODUODENOSCOPY (EGD) WITH PROPOFOL;  Surgeon: Hulen Luster, MD;  Location[facility] ENDOSCOPY;  Service: Gastroenterology;  Laterality: N/A;  . Septoplasty    . Cardiac catheterization Left 02/03/2016    Procedure: Left Heart Cath and Coronary Angiography;  Surgeon: Teodoro Spray, MD;  Location: [facility] CV LAB;  Service: Cardiovascular;  Laterality: Left;    SOCIAL HISTORY:  Social History  Substance Use Topics  . Smoking status: Former Smoker -- 25 years  . Smokeless tobacco: Not on file  . Alcohol Use: 8.4 oz/week    14 Shots of liquor per week     Comment: 14 shot per week    FAMILY HISTORY:  Family History  Problem Relation Age of Onset  . Heart disease Mother   . Hypertension Father     DRUG ALLERGIES: No Known Allergies  REVIEW OF SYSTEMS:   CONSTITUTIONAL: No fever, fatigue or weakness.  EYES: No blurred or double vision.  EARS, NOSE, AND THROAT: No tinnitus or ear pain.  RESPIRATORY: No cough, shortness of breath, wheezing or hemoptysis.  CARDIOVASCULAR: Has chest pain, no orthopnea, edema.  GASTROINTESTINAL: No nausea, vomiting, diarrhea or abdominal pain.  GENITOURINARY: No dysuria, hematuria.  ENDOCRINE: No polyuria, nocturia,  HEMATOLOGY: No anemia, easy bruising or bleeding SKIN: No rash or lesion. MUSCULOSKELETAL: No joint pain or arthritis.   NEUROLOGIC: No tingling, numbness, weakness.  PSYCHIATRY: No anxiety or depression.   MEDICATIONS AT HOME:  Prior to Admission medications   Medication Sig Start Date End Date Taking? Authorizing Provider  allopurinol (ZYLOPRIM) 300 MG tablet Take 300 mg by mouth every evening.    Yes Historical Provider, MD  amLODipine (NORVASC) 10 MG tablet Take 10 mg by mouth every evening.    Yes Historical Provider, MD  aspirin EC 81 MG tablet Take 81 mg by mouth daily.   Yes Historical Provider, MD  atorvastatin (LIPITOR) 40 MG tablet Take 40 mg by mouth at bedtime.    Yes Historical Provider, MD  cholecalciferol  (VITAMIN D) 1000 units tablet Take 2,000 Units by mouth daily.   Yes Historical Provider, MD  gabapentin (NEURONTIN) 300 MG capsule Take 300-900 mg by mouth 4 (four) times daily. Pt takes one capsule three times daily and three capsules at bedtime.   Yes Historical Provider, MD  hydrochlorothiazide (HYDRODIURIL) 25 MG tablet Take 25 mg by mouth daily.   Yes Historical Provider, MD  HYDROcodone-acetaminophen (NORCO/VICODIN) 5-325 MG tablet Take 1 tablet by mouth 2 (two) times daily as needed for severe pain. 02/06/16  Yes Milinda Pointer, MD  isosorbide mononitrate (IMDUR) 30 MG 24 hr tablet Take 30 mg by mouth daily.   Yes Historical Provider, MD  losartan (COZAAR) 25 MG tablet Take 25 mg by mouth at bedtime. 02/14/16  Yes Historical Provider, MD  metoprolol (LOPRESSOR) 50 MG tablet Take 50 mg by mouth 3 (three) times daily.   Yes Historical Provider, MD  Multiple Vitamin (MULTIVITAMIN WITH MINERALS) TABS tablet Take 1 tablet by mouth every evening.   Yes Historical Provider, MD  Potassium 95 MG TABS Take 95 mg by mouth daily.   Yes Historical Provider, MD  ranolazine (RANEXA) 500 MG 12 hr tablet Take 1 tablet (500 mg total) by mouth 2 (two) times daily. 02/25/16  Yes Vaughan Basta, MD  Cholecalciferol (VITAMIN D3) 2000 units capsule Take 1 capsule (2,000 Units total) by mouth daily. Patient not taking: Reported on 02/24/2016 11/21/15   Milinda Pointer, MD      PHYSICAL EXAMINATION:   VITAL SIGNS: Blood pressure 137/85, pulse 86, temperature 97.8 F (36.6 C), temperature source Oral, resp. rate 17, height 5\' 3"  (1.6 m), weight 74.844 kg (165 lb), SpO2 96 %.  GENERAL:  60 y.o.-year-old patient lying in the bed with no acute distress.  EYES: Pupils equal, round, reactive to light and accommodation. No scleral icterus. Extraocular muscles intact.  HEENT: Head atraumatic, normocephalic. Oropharynx and nasopharynx clear.  NECK:  Supple, no jugular venous distention. No thyroid enlargement, no  tenderness.  LUNGS: Normal breath sounds bilaterally, no wheezing, rales,rhonchi or crepitation. No use of accessory muscles of respiration. Midline scar noted over chest wall. CARDIOVASCULAR: S1, S2 normal. No murmurs, rubs, or gallops.  ABDOMEN: Soft, nontender, nondistended. Bowel sounds present. No organomegaly or mass.  EXTREMITIES: No pedal edema, cyanosis, or clubbing.  NEUROLOGIC: Cranial nerves II through XII are intact. Muscle strength 5/5 in all extremities. Sensation intact. Gait not checked.  PSYCHIATRIC: The patient is alert and oriented x 3.  SKIN: No obvious rash, lesion, or ulcer.   LABORATORY PANEL:   CBC  Recent Labs Lab 02/24/16 1513 02/25/16 0040 02/28/16 2000  WBC 13.4* 11.2* 15.8*  HGB 13.2 12.6* 13.7  HCT 35.6* 35.5* 38.3*  PLT 180 175 205  MCV 88.6 90.4 89.4  MCH 32.8 32.0 32.0  MCHC 37.0* 35.4 35.8  RDW 14.3 14.0 14.7*  LYMPHSABS 1.2  --   --   MONOABS 0.9  --   --  EOSABS 0.0  --   --   BASOSABS 0.1  --   --    ------------------------------------------------------------------------------------------------------------------  Chemistries   Recent Labs Lab 02/24/16 1513 02/25/16 0040 02/28/16 2000  NA 135 133* 132*  K 4.1 4.0 3.8  CL 100* 98* 99*  CO2 24 24 20*  GLUCOSE 191* 227* 239*  BUN 32* 35* 56*  CREATININE 1.07 1.12 2.22*  CALCIUM 9.1 8.5* 8.7*  AST 39  --   --   ALT 65*  --   --   ALKPHOS 69  --   --   BILITOT 1.2  --   --    ------------------------------------------------------------------------------------------------------------------ estimated creatinine clearance is 31.3 mL/min (by C-G formula based on Cr of 2.22). ------------------------------------------------------------------------------------------------------------------ No results for input(s): TSH, T4TOTAL, T3FREE, THYROIDAB in the last 72 hours.  Invalid input(s): FREET3   Coagulation profile No results for input(s): INR, PROTIME in the last 168  hours. ------------------------------------------------------------------------------------------------------------------- No results for input(s): DDIMER in the last 72 hours. -------------------------------------------------------------------------------------------------------------------  Cardiac Enzymes  Recent Labs Lab 02/25/16 0652 02/28/16 2000 02/28/16 2256  TROPONINI <0.03 <0.03 <0.03   ------------------------------------------------------------------------------------------------------------------ Invalid input(s): POCBNP  ---------------------------------------------------------------------------------------------------------------  Urinalysis    Component Value Date/Time   COLORURINE Yellow 05/16/2013 0059   APPEARANCEUR Clear 05/16/2013 0059   LABSPEC 1.021 05/16/2013 0059   PHURINE 6.0 05/16/2013 0059   GLUCOSEU Negative 05/16/2013 0059   HGBUR Negative 05/16/2013 0059   BILIRUBINUR Negative 05/16/2013 0059   KETONESUR Negative 05/16/2013 0059   PROTEINUR Negative 05/16/2013 0059   NITRITE Negative 05/16/2013 0059   LEUKOCYTESUR Negative 05/16/2013 0059     RADIOLOGY: Dg Chest 2 View  02/28/2016  CLINICAL DATA:  Substernal chest pain EXAM: CHEST  2 VIEW COMPARISON:  02/24/2016 FINDINGS: Cardiac shadow is within normal limits. Postsurgical changes are noted. Aortic calcifications are again seen and stable. The lungs are well aerated bilaterally. Degenerative changes of the thoracic spine are noted. IMPRESSION: Aortic atherosclerosis. No acute abnormality noted. Electronically Signed   By: Inez Catalina M.D.   On: 02/28/2016 20:41    EKG: Orders placed or performed during the hospital encounter of 02/28/16  . EKG 12-Lead  . EKG 12-Lead  . ED EKG within 10 minutes  . ED EKG within 10 minutes    IMPRESSION AND PLAN: 60 year old male patient with history of coronary artery disease, hypertension, hyperlipidemia, gout presented to the emergency room with  chest pain. Admitting diagnosis 1. Unstable angina 2. Hypertension 3. Hyperlipidemia 4. Coronary artery disease 5. Acute kidney disease Treatment plan Admit patient to telemetry observation bed Aspirin 81 mg orally daily IV fluid hydration Cycle troponin to rule out ischemia Check echocardiogram Follow-up renal function DVT prophylaxis subcutaneous heparin.  All the records are reviewed and case discussed with ED provider. Management plans discussed with the patient, family and they are in agreement.  CODE STATUS:FULL Code Status History    Date Active Date Inactive Code Status Order ID Comments User Context   02/24/2016  5:54 PM 02/25/2016  6:04 PM Full Code [id number]  Demetrios Loll, MD Inpatient       TOTAL TIME TAKING CARE OF THIS PATIENT: 50 minutes.    Saundra Shelling M.D on 02/29/2016 at 1:54 AM  Between 7am to 6pm - Pager - [phone]  After 6pm go to [email or web address] - password [email or web address] [facility]  [place] [facility]  Office  [phone]  CC: Primary care physician; Elisabeth Cara, NP

## 2016-02-29 NOTE — Progress Notes (Signed)
[facility]  SUBJECTIVE: NO further chest pain   Filed Vitals:   02/29/16 0330 02/29/16 0410 02/29/16 0759 02/29/16 1317  BP: 143/89 154/89 139/73 133/76  Pulse: 84 80 81 84  Temp:  98.1 F (36.7 C) 98.4 F (36.9 C) 99 F (37.2 C)  TempSrc:  Oral Oral Oral  Resp: 17 20 16 16   Height:      Weight:      SpO2: 97% 100% 96% 97%    Intake/Output Summary (Last 24 hours) at 02/29/16 1539 Last data filed at 02/29/16 1319  Gross per 24 hour  Intake 666.67 ml  Output   1975 ml  Net -1308.33 ml    LABS: Basic Metabolic Panel:  Recent Labs  02/28/16 2000 02/29/16 0517  NA 132* 131*  K 3.8 3.7  CL 99* 100*  CO2 20* 22  GLUCOSE 239* 201*  BUN 56* 35*  CREATININE 2.22* 1.26*  CALCIUM 8.7* 8.1*   Liver Function Tests: No results for input(s): AST, ALT, ALKPHOS, BILITOT, PROT, ALBUMIN in the last 72 hours. No results for input(s): LIPASE, AMYLASE in the last 72 hours. CBC:  Recent Labs  02/28/16 2000 02/29/16 0517  WBC 15.8* 10.7*  HGB 13.7 13.0  HCT 38.3* 36.8*  MCV 89.4 89.9  PLT 205 143*   Cardiac Enzymes:  Recent Labs  02/28/16 2256 02/29/16 0517 02/29/16 1005  TROPONINI <0.03 <0.03 <0.03   BNP: Invalid input(s): POCBNP D-Dimer: No results for input(s): DDIMER in the last 72 hours. Hemoglobin A1C: No results for input(s): HGBA1C in the last 72 hours. Fasting Lipid Panel:  Recent Labs  02/29/16 0517  CHOL 99  HDL 30*  LDLCALC 52  TRIG 83  CHOLHDL 3.3   Thyroid Function Tests: No results for input(s): TSH, T4TOTAL, T3FREE, THYROIDAB in the last 72 hours.  Invalid input(s): FREET3 Anemia Panel: No results for input(s): VITAMINB12, FOLATE, FERRITIN, TIBC, IRON, RETICCTPCT in the last 72 hours.   Physical Exam: Blood pressure 133/76, pulse 84, temperature 99 F (37.2 C), temperature source Oral, resp. rate 16, height 5\' 3"  (1.6 m), weight 74.844 kg (165 lb), SpO2 97 %.   Wt Readings from Last 1  Encounters:  02/28/16 74.844 kg (165 lb)     General appearance: alert and cooperative Resp: clear to auscultation bilaterally Cardio: regular rate and rhythm GI: soft, non-tender; bowel sounds normal; no masses,  no organomegaly Extremities: extremities normal, atraumatic, no cyanosis or edema Neurologic: Grossly normal  TELEMETRY: Reviewed telemetry pt in nsr:  ASSESSMENT AND PLAN:  Active Problems:   Chest pain-Ruled out for  Mi. No further chest pain. Reviewed cath from 6/2 which revealed occluded free lima to lad and occluded svg to om with patent svg to diagonal and patent svg to pda. Signficant proximal lad disease with fair target. Discussed with CT surgery at [facility]. Will increase renexa to 1000 mg bid and discharge to home. Will proceed with cta of rima to determine suitability for redo bypass to lad. If doable, would refer to [facility] for redo cabg. Will discuss in cath conference as well.     Teodoro Spray., MD, [facility] 02/29/2016 3:39 PM

## 2016-02-29 NOTE — Consult Note (Signed)
[place]  CARDIOLOGY CONSULT NOTE  Patient ID: [name] [name]. MRN: [id number] DOB/AGE: 62-08-1956 62 y.o.  Admit date: 02/28/2016 Referring Physician Dr. Tressia Miners Primary Physician   Primary Cardiologist Dr. Ubaldo Glassing Reason for Consultation  Recurrent chest pain  HPI: Pt isA 60 year old male who is status post s/p   CABG X 4 (Free LIMA-LAD, SVG-PDA, SVG-OM2, SVG-Ramus) in 8/13 at [facility]. He had done fairly well for several years post-bypass until 2 months ago developed recurrent chest pain both at rest with exertion. He underwent cardiac catheterization which revealed having an three-vessel disease with a patent F his vein graft to PDA, patent saphenous vein graft to a diagonal/ramus intermedius as well as an occluded free LIMA to the LAD and an occluded saphenous vein graft to the circumflex. Due to the severe diffuse nature of his disease attempt at medical management was attempted. He did not appear to be a candidate for percutaneous intervention. Attempted medical treatment was begun. He was admitted last week with chest pain rule out for myocardial infarction was placed on Ranexa in addition to his metoprolol, aspirin, atorvastatin, amlodipine. Is also on 30 mg of Imdur. He is ruled out for myocardial infarction again. We will discuss consideration for redo CABG with his surgeon at [facility]. Further recommendations based on results of this. Continue with current regimen for now. Review of Systems  Constitutional: Negative.   HENT: Negative.   Eyes: Negative.   Respiratory: Negative.   Cardiovascular: Positive for chest pain.  Gastrointestinal: Negative.   Genitourinary: Negative.   Musculoskeletal: Negative.   Skin: Negative.   Neurological: Negative.   Endo/Heme/Allergies: Negative.   Psychiatric/Behavioral: Negative.     Past Medical History  Diagnosis Date  . Diverticulitis   . Hypertension   . Gout   . Sleep apnea    . Hyperlipidemia   . Cardiomyopathy, secondary ([place])   . Coronary artery disease   . Hemorrhoids   . Benign esophageal stricture   . Tubular adenoma of colon     Family History  Problem Relation Age of Onset  . Heart disease Mother   . Hypertension Father     Social History   Social History  . Marital Status: Single    Spouse Name: N/A  . Number of Children: N/A  . Years of Education: N/A   Occupational History  . retired    Social History Main Topics  . Smoking status: Former Smoker -- 25 years  . Smokeless tobacco: Not on file  . Alcohol Use: 8.4 oz/week    14 Shots of liquor per week     Comment: 14 shot per week  . Drug Use: No  . Sexual Activity: Not on file   Other Topics Concern  . Not on file   Social History Narrative    Past Surgical History  Procedure Laterality Date  . Coronary angioplasty with stent placement      x2  . Cardiac bypass    . Cardiac surgery    . Coronary artery bypass graft    . Colonoscopy with propofol N/A 04/29/2015    Procedure: COLONOSCOPY WITH PROPOFOL;  Surgeon: Hulen Luster, MD;  Location: [facility] ENDOSCOPY;  Service: Gastroenterology;  Laterality: N/A;  . Esophagogastroduodenoscopy (egd) with propofol N/A 04/29/2015    Procedure: ESOPHAGOGASTRODUODENOSCOPY (EGD) WITH PROPOFOL;  Surgeon: Hulen Luster, MD;  Location: [facility] ENDOSCOPY;  Service: Gastroenterology;  Laterality: N/A;  . Septoplasty    . Cardiac  catheterization Left 02/03/2016    Procedure: Left Heart Cath and Coronary Angiography;  Surgeon: Teodoro Spray, MD;  Location: [place] CV LAB;  Service: Cardiovascular;  Laterality: Left;     Facility-administered medications prior to admission  Medication Dose Route Frequency Provider Last Rate Last Dose  . fentaNYL (SUBLIMAZE) injection 25-50 mcg  25-50 mcg Intravenous Q5 min PRN Milinda Pointer, MD       Prescriptions prior to admission  Medication Sig Dispense Refill Last Dose  . allopurinol (ZYLOPRIM) 300 MG tablet  Take 300 mg by mouth every evening.    02/28/2016 at Unknown time  . amLODipine (NORVASC) 10 MG tablet Take 10 mg by mouth every evening.    02/28/2016 at Unknown time  . aspirin EC 81 MG tablet Take 81 mg by mouth daily.   02/28/2016 at 0800  . atorvastatin (LIPITOR) 40 MG tablet Take 40 mg by mouth at bedtime.    02/27/2016 at Unknown time  . cholecalciferol (VITAMIN D) 1000 units tablet Take 2,000 Units by mouth daily.   02/28/2016 at Unknown time  . gabapentin (NEURONTIN) 300 MG capsule Take 300-900 mg by mouth 4 (four) times daily. Pt takes one capsule three times daily and three capsules at bedtime.   02/28/2016 at Unknown time  . hydrochlorothiazide (HYDRODIURIL) 25 MG tablet Take 25 mg by mouth daily.   02/28/2016 at Unknown time  . HYDROcodone-acetaminophen (NORCO/VICODIN) 5-325 MG tablet Take 1 tablet by mouth 2 (two) times daily as needed for severe pain. 60 tablet 0 prn at prn  . isosorbide mononitrate (IMDUR) 30 MG 24 hr tablet Take 30 mg by mouth daily.  1 02/28/2016 at Unknown time  . losartan (COZAAR) 25 MG tablet Take 25 mg by mouth at bedtime.   02/27/2016 at Unknown time  . metoprolol (LOPRESSOR) 50 MG tablet Take 50 mg by mouth 3 (three) times daily.   02/28/2016 at Unknown time  . Multiple Vitamin (MULTIVITAMIN WITH MINERALS) TABS tablet Take 1 tablet by mouth every evening.   02/28/2016 at Unknown time  . Potassium 95 MG TABS Take 95 mg by mouth daily.   02/28/2016 at Unknown time  . ranolazine (RANEXA) 500 MG 12 hr tablet Take 1 tablet (500 mg total) by mouth 2 (two) times daily. 60 tablet 0 02/28/2016 at Unknown time  . Cholecalciferol (VITAMIN D3) 2000 units capsule Take 1 capsule (2,000 Units total) by mouth daily. (Patient not taking: Reported on 02/24/2016) 30 capsule PRN Taking    Physical Exam: Blood pressure 139/73, pulse 81, temperature 98.4 F (36.9 C), temperature source Oral, resp. rate 16, height 5\' 3"  (1.6 m), weight 74.844 kg (165 lb), SpO2 96 %.   Wt Readings from Last 1  Encounters:  02/28/16 74.844 kg (165 lb)     General appearance: alert and cooperative Resp: clear to auscultation bilaterally Cardio: regular rate and rhythm GI: soft, non-tender; bowel sounds normal; no masses,  no organomegaly Extremities: extremities normal, atraumatic, no cyanosis or edema Neurologic: Grossly normal  Labs:   Lab Results  Component Value Date   WBC 10.7* 02/29/2016   HGB 13.0 02/29/2016   HCT 36.8* 02/29/2016   MCV 89.9 02/29/2016   PLT 143* 02/29/2016    Recent Labs Lab 02/24/16 1513  02/29/16 0517  NA 135  < > 131*  K 4.1  < > 3.7  CL 100*  < > 100*  CO2 24  < > 22  BUN 32*  < > 35*  CREATININE 1.07  < >  1.26*  CALCIUM 9.1  < > 8.1*  PROT 7.5  --   --   BILITOT 1.2  --   --   ALKPHOS 69  --   --   ALT 65*  --   --   AST 39  --   --   GLUCOSE 191*  < > 201*  < > = values in this interval not displayed. Lab Results  Component Value Date   CKTOTAL 82 05/16/2013   CKMB 1.4 05/16/2013   TROPONINI <0.03 02/29/2016      Radiology: No acute cardiopulmonary disease EKG: NSR with no ischemia  ASSESSMENT AND PLAN:  Pt with history of cad s/p cabg in 2013. Cath 3-4 weeks ago showed evidence of recurrent disease with occluded free lima to lad and svg to om. Patent svg to diagonal and pda. On maximum meds. Will discuss with cardiac surgeon regarding further treatment to consider redo cabg.  Signed: Teodoro Spray MD, [facility] 02/29/2016, 8:04 AM

## 2016-02-29 NOTE — Progress Notes (Signed)
Inpatient Diabetes Program Recommendations  AACE/ADA: New Consensus Statement on Inpatient Glycemic Control (2015)  Target Ranges:  Prepandial:   less than 140 mg/dL      Peak postprandial:   less than 180 mg/dL (1-2 hours)      Critically ill patients:  140 - 180 mg/dL   Results for ARSENY, WINSCHEL (MRN [id number]) as of 02/29/2016 14:03  Ref. Range 02/28/2016 20:00 02/29/2016 05:17  Glucose Latest Ref Range: 65-99 mg/dL 239 (H) 201 (H)    Admit with: CP  History: CAD, HTN    -Glucose levels elevated >200 mg/dl on last 2 BMETs.  -No History of DM mentioned in H&P.    MD- Please consider the following:  1. Start Novolog Sensitive Correction Scale/ SSI (0-9 units) TID AC + HS  2. Check Hemoglobin A1c level to assess glucose levels prior to hospitalization     --Will follow patient during hospitalization--  Wyn Quaker RN, MSN, CDE Diabetes Coordinator Inpatient Glycemic Control Team Team Pager: [phone] (8a-5p)

## 2016-02-29 NOTE — ED Notes (Signed)
Pt is sleeping at this time no distresss noted. Pt awaiting on inpatient bed.

## 2016-02-29 NOTE — ED Provider Notes (Signed)
Time Seen: Approximately 2020 I have reviewed the triage notes  Chief Complaint: Chest Pain   History of Present Illness: [name] [name]. is a 60 y.o. male who presents with acute exacerbation of his chest discomfort. Patient states he tried 2 nitroglycerin prior to arrival but still had pain though he states now he is currently pain-free. He describes 2 separate areas of chest pain one is chronic which is a substernal pain and then there is a left-sided discomfort that's primarily acute coronary syndrome. Patient's been recently admitted and had a heart catheterization which showed several areas of atherosclerotic disease. The patient was recommended the medication medically at this time and is currently on chronic isosorbide. He has had a history of a previous myocardial infarction in January 2014. His chronic pains over the central area of the chest which seems to be a persistent pain for the patient.   Past Medical History  Diagnosis Date  . Diverticulitis   . Hypertension   . Gout   . Sleep apnea   . Hyperlipidemia   . Cardiomyopathy, secondary ([place])   . Coronary artery disease   . Hemorrhoids   . Benign esophageal stricture   . Tubular adenoma of colon     Patient Active Problem List   Diagnosis Date Noted  . Unstable angina ([place]) 02/24/2016  . Keloid skin disorder 01/17/2016  . Costochondritis 01/17/2016  . Long term current use of opiate analgesic 12/23/2015  . Long term prescription opiate use 12/23/2015  . Vitamin D insufficiency 11/21/2015  . Lumbar facet syndrome (Bilateral) (R>L) 11/21/2015  . Lumbar spondylosis 11/21/2015  . Elevated sedimentation rate 11/01/2015  . Chronic low back pain (Bilateral) (R>L) 10/25/2015  . Chronic shoulder pain (Location of Tertiary source of pain) (Right) 10/25/2015  . Chronic hip pain (Left) 10/25/2015  . Chronic pain 10/25/2015  . History of alcoholism ([place]) 10/25/2015  . Opiate use 10/25/2015  . Encounter for therapeutic  drug level monitoring 10/25/2015  . Chronic chest wall pain (Location of Primary Source of Pain) (Incisional Midline) (since 04/22/2012) 10/25/2015  . Chronic lower extremity pain (Location of Secondary source of pain) (Bilateral) (R>L) 10/25/2015  . History of MI (myocardial infarction) (January 2014) 10/25/2015  . Encounter for pain management planning 10/25/2015  . Chronic sacroiliac joint pain (Bilateral) (L>R) 10/25/2015  . Neuropathic pain 10/25/2015  . Neurogenic pain 10/25/2015  . Incisional pain 10/25/2015  . HLD (hyperlipidemia) 03/16/2014  . H/O coronary artery bypass surgery 05/12/2012  . Alcohol withdrawal syndrome ([place]) 04/28/2012  . Injury of kidney 04/25/2012  . Obstructive apnea 04/22/2012  . BP (high blood pressure) 04/22/2012  . Arteriosclerosis of coronary artery 04/22/2012  . Apnea, sleep 04/21/2012  . Chronic obstructive pulmonary disease ([place]) 04/21/2012    Past Surgical History  Procedure Laterality Date  . Coronary angioplasty with stent placement      x2  . Cardiac bypass    . Cardiac surgery    . Coronary artery bypass graft    . Colonoscopy with propofol N/A 04/29/2015    Procedure: COLONOSCOPY WITH PROPOFOL;  Surgeon: Hulen Luster, MD;  Location: [facility] ENDOSCOPY;  Service: Gastroenterology;  Laterality: N/A;  . Esophagogastroduodenoscopy (egd) with propofol N/A 04/29/2015    Procedure: ESOPHAGOGASTRODUODENOSCOPY (EGD) WITH PROPOFOL;  Surgeon: Hulen Luster, MD;  Location: [facility] ENDOSCOPY;  Service: Gastroenterology;  Laterality: N/A;  . Septoplasty    . Cardiac catheterization Left 02/03/2016    Procedure: Left Heart Cath and Coronary Angiography;  Surgeon: Chrissie Noa  Ferd Hibbs, MD;  Location: [place] CV LAB;  Service: Cardiovascular;  Laterality: Left;    Past Surgical History  Procedure Laterality Date  . Coronary angioplasty with stent placement      x2  . Cardiac bypass    . Cardiac surgery    . Coronary artery bypass graft    . Colonoscopy with propofol  N/A 04/29/2015    Procedure: COLONOSCOPY WITH PROPOFOL;  Surgeon: Hulen Luster, MD;  Location: [facility] ENDOSCOPY;  Service: Gastroenterology;  Laterality: N/A;  . Esophagogastroduodenoscopy (egd) with propofol N/A 04/29/2015    Procedure: ESOPHAGOGASTRODUODENOSCOPY (EGD) WITH PROPOFOL;  Surgeon: Hulen Luster, MD;  Location: [facility] ENDOSCOPY;  Service: Gastroenterology;  Laterality: N/A;  . Septoplasty    . Cardiac catheterization Left 02/03/2016    Procedure: Left Heart Cath and Coronary Angiography;  Surgeon: Teodoro Spray, MD;  Location: [place] CV LAB;  Service: Cardiovascular;  Laterality: Left;    Current Outpatient Rx  Name  Route  Sig  Dispense  Refill  . allopurinol (ZYLOPRIM) 300 MG tablet   Oral   Take 300 mg by mouth every evening.          Marland Kitchen amLODipine (NORVASC) 10 MG tablet   Oral   Take 10 mg by mouth every evening.          Marland Kitchen aspirin EC 81 MG tablet   Oral   Take 81 mg by mouth daily.         Marland Kitchen atorvastatin (LIPITOR) 40 MG tablet   Oral   Take 40 mg by mouth at bedtime.          . cholecalciferol (VITAMIN D) 1000 units tablet   Oral   Take 2,000 Units by mouth daily.         Marland Kitchen gabapentin (NEURONTIN) 300 MG capsule   Oral   Take 300-900 mg by mouth 4 (four) times daily. Pt takes one capsule three times daily and three capsules at bedtime.         . hydrochlorothiazide (HYDRODIURIL) 25 MG tablet   Oral   Take 25 mg by mouth daily.         Marland Kitchen HYDROcodone-acetaminophen (NORCO/VICODIN) 5-325 MG tablet   Oral   Take 1 tablet by mouth 2 (two) times daily as needed for severe pain.   60 tablet   0     Do not place this medication, or any other prescri ...   . isosorbide mononitrate (IMDUR) 30 MG 24 hr tablet   Oral   Take 30 mg by mouth daily.      1   . losartan (COZAAR) 25 MG tablet   Oral   Take 25 mg by mouth at bedtime.         . metoprolol (LOPRESSOR) 50 MG tablet   Oral   Take 50 mg by mouth 3 (three) times daily.         . Multiple  Vitamin (MULTIVITAMIN WITH MINERALS) TABS tablet   Oral   Take 1 tablet by mouth every evening.         . Potassium 95 MG TABS   Oral   Take 95 mg by mouth daily.         . ranolazine (RANEXA) 500 MG 12 hr tablet   Oral   Take 1 tablet (500 mg total) by mouth 2 (two) times daily.   60 tablet   0   . Cholecalciferol (VITAMIN D3) 2000 units capsule  Take 1 capsule (2,000 Units total) by mouth daily. Patient not taking: Reported on 02/24/2016   30 capsule   PRN     Do not place this medication, or any other prescri ...     Allergies:  Review of patient's allergies indicates no known allergies.  Family History: Family History  Problem Relation Age of Onset  . Heart disease Mother   . Hypertension Father     Social History: Social History  Substance Use Topics  . Smoking status: Former Smoker -- 25 years  . Smokeless tobacco: None  . Alcohol Use: 8.4 oz/week    14 Shots of liquor per week     Comment: 14 shot per week     Review of Systems:   10 point review of systems was performed and was otherwise negative:  Constitutional: No fever Eyes: No visual disturbances ENT: No sore throat, ear pain Cardiac: Chronic chest pain Respiratory: No shortness of breath, wheezing, or stridor Abdomen: No abdominal pain, no vomiting, No diarrhea Endocrine: No weight loss, No night sweats Extremities: No peripheral edema, cyanosis Skin: No rashes, easy bruising Neurologic: No focal weakness, trouble with speech or swollowing Urologic: No dysuria, Hematuria, or urinary frequency   Physical Exam:  ED Triage Vitals  Enc Vitals Group     BP 02/28/16 1954 140/78 mmHg     Pulse Rate 02/28/16 1954 86     Resp 02/28/16 1954 5     Temp 02/28/16 1954 97.8 F (36.6 C)     Temp Source 02/28/16 1954 Oral     SpO2 02/28/16 1948 98 %     Weight 02/28/16 1954 165 lb (74.844 kg)     Height 02/28/16 1954 5\' 3"  (1.6 m)     Head Cir --      Peak Flow --      Pain Score  02/28/16 2300 0     Pain Loc --      Pain Edu? --      Excl. in [place]? --     General: Awake , Alert , and Oriented times 3; GCS 15 Head: Normal cephalic , atraumatic Eyes: Pupils equal , round, reactive to light Nose/Throat: No nasal drainage, patent upper airway without erythema or exudate.  Neck: Supple, Full range of motion, No anterior adenopathy or palpable thyroid masses Lungs: Clear to ascultation without wheezes , rhonchi, or rales Heart: Regular rate, regular rhythm without murmurs , gallops , or rubs Abdomen: Soft, non tender without rebound, guarding , or rigidity; bowel sounds positive and symmetric in all 4 quadrants. No organomegaly .        Extremities: 2 plus symmetric pulses. No edema, clubbing or cyanosis Neurologic: normal ambulation, Motor symmetric without deficits, sensory intact Skin: warm, dry, no rashes   Labs:   All laboratory work was reviewed including any pertinent negatives or positives listed below:  Labs Reviewed  BASIC METABOLIC PANEL - Abnormal; Notable for the following:    Sodium 132 (*)    Chloride 99 (*)    CO2 20 (*)    Glucose, Bld 239 (*)    BUN 56 (*)    Creatinine, Ser 2.22 (*)    Calcium 8.7 (*)    GFR calc non Af Amer 30 (*)    GFR calc Af Amer 35 (*)    All other components within normal limits  CBC - Abnormal; Notable for the following:    WBC 15.8 (*)    RBC 4.28 (*)  HCT 38.3 (*)    RDW 14.7 (*)    All other components within normal limits  TROPONIN I  TROPONIN I  Examination shows what appears to be some new onset renal insufficiency BUN 56 creatinine is 2.22. Serial troponins are actually negative for any ischemia.  EKG:  ED ECG REPORT I, Daymon Larsen, the attending physician, personally viewed and interpreted this ECG.  Date: 02/29/2016 EKG Time: 1949 Rate: 84 Rhythm: normal sinus rhythm QRS Axis: Left axis deviation Intervals: Prolonged QT interval ST/T Wave abnormalities: normal Conduction Disturbances:  none Narrative Interpretation: unremarkable No acute ischemic changes   Radiology:       DG Chest 2 View (Final result) Result time: 02/28/16 20:41:21   Final result by Rad Results In Interface (02/28/16 20:41:21)   Narrative:   CLINICAL DATA: Substernal chest pain  EXAM: CHEST 2 VIEW  COMPARISON: 02/24/2016  FINDINGS: Cardiac shadow is within normal limits. Postsurgical changes are noted. Aortic calcifications are again seen and stable. The lungs are well aerated bilaterally. Degenerative changes of the thoracic spine are noted.  IMPRESSION: Aortic atherosclerosis.  No acute abnormality noted.   Electronically Signed By: Inez Catalina M.D. On: 02/28/2016 20:41      I personally reviewed the radiologic studies   P  ED Course: * Patient was started on IV fluid resuscitation. His white blood cell count may be elevated secondary to hemoconcentration. The patient had a recent heart catheterization and not sure if this the blood To direct cause and the renal insufficiency. Patient is currently hemodynamically stable.    Assessment: Renal insufficiency Acute exacerbation of acute coronary syndrome History of coronary artery disease      Plan: * Inpatient management           Daymon Larsen, MD 02/29/16 [phone]

## 2016-02-29 NOTE — Care Management Obs Status (Signed)
[place] NOTIFICATION   Patient Details  Name: [name] [name]. MRN: [id number] Date of Birth: 04/08/1956   Medicare Observation Status Notification Given:  Yes    Katrina Stack, RN 02/29/2016, 8:48 AM

## 2016-02-29 NOTE — Progress Notes (Signed)
Patient was admitted from the ER following a c/o CP. On admission to the unit patient was A&O X4, ambulatory and denied CP or being in any discomfort. Patient was oriented to his room , admission documentation was completed and patient needed items were placed within patient's reach. Will continue to monitor.

## 2016-02-29 NOTE — Care Management (Signed)
Patient placed in observation for chest pain.  Recent observation stay 6/23 for same.  Has cardiac history with a recent cardiac catheterization by Dr Ubaldo Glassing 6/2 which showed Dr. Ubaldo Glassing on 02/03/2016 which showed significant occlusive disease not amenable to PCI. This included a 70% mid RCA lesion, 75% proximal RCA lesion, 100% proximal graft lesion, 80% LM lesion, and 65% proximal circumflex lesion. Dr. Saralyn Pilar suggested medical treatment.  Initial troponins are negative.  Cardiology consult pending.  At present, no discharge needs identified.

## 2016-03-01 ENCOUNTER — Other Ambulatory Visit: Payer: Self-pay | Admitting: Cardiology

## 2016-03-01 DIAGNOSIS — R079 Chest pain, unspecified: Secondary | ICD-10-CM

## 2016-03-14 DIAGNOSIS — Z1329 Encounter for screening for other suspected endocrine disorder: Secondary | ICD-10-CM | POA: Insufficient documentation

## 2016-03-14 DIAGNOSIS — R682 Dry mouth, unspecified: Secondary | ICD-10-CM | POA: Insufficient documentation

## 2016-03-14 DIAGNOSIS — R81 Glycosuria: Secondary | ICD-10-CM | POA: Insufficient documentation

## 2016-03-14 DIAGNOSIS — I209 Angina pectoris, unspecified: Secondary | ICD-10-CM | POA: Insufficient documentation

## 2016-03-14 DIAGNOSIS — R351 Nocturia: Secondary | ICD-10-CM | POA: Insufficient documentation

## 2016-03-20 ENCOUNTER — Emergency Department: Payer: Medicare Other

## 2016-03-20 ENCOUNTER — Emergency Department
Admission: EM | Admit: 2016-03-20 | Discharge: 2016-03-20 | Disposition: A | Discharge status: Home / Self Care | Payer: Medicare Other | Attending: Student | Admitting: Student

## 2016-03-20 ENCOUNTER — Encounter: Payer: Self-pay | Admitting: Emergency Medicine

## 2016-03-20 DIAGNOSIS — R0602 Shortness of breath: Secondary | ICD-10-CM

## 2016-03-20 DIAGNOSIS — Z87891 Personal history of nicotine dependence: Secondary | ICD-10-CM | POA: Insufficient documentation

## 2016-03-20 DIAGNOSIS — I2510 Atherosclerotic heart disease of native coronary artery without angina pectoris: Secondary | ICD-10-CM | POA: Insufficient documentation

## 2016-03-20 DIAGNOSIS — I10 Essential (primary) hypertension: Secondary | ICD-10-CM | POA: Diagnosis not present

## 2016-03-20 DIAGNOSIS — R531 Weakness: Secondary | ICD-10-CM | POA: Insufficient documentation

## 2016-03-20 DIAGNOSIS — E785 Hyperlipidemia, unspecified: Secondary | ICD-10-CM | POA: Insufficient documentation

## 2016-03-20 DIAGNOSIS — Z7982 Long term (current) use of aspirin: Secondary | ICD-10-CM | POA: Diagnosis not present

## 2016-03-20 DIAGNOSIS — Z79899 Other long term (current) drug therapy: Secondary | ICD-10-CM | POA: Insufficient documentation

## 2016-03-20 LAB — URINALYSIS COMPLETE WITH MICROSCOPIC (ARMC ONLY)
BACTERIA UA: NONE SEEN
BILIRUBIN URINE: NEGATIVE
Glucose, UA: >500 mg/dL — AB
HGB URINE DIPSTICK: NEGATIVE
Ketones, ur: NEGATIVE mg/dL
LEUKOCYTES UA: NEGATIVE
Nitrite: NEGATIVE
PH: 6 (ref 5.0–8.0)
PROTEIN: NEGATIVE mg/dL
SQUAMOUS EPITHELIAL / LPF: NONE SEEN
Specific Gravity, Urine: 1.009 (ref 1.005–1.030)
WBC UA: NONE SEEN WBC/hpf (ref 0–5)

## 2016-03-20 LAB — CBC
HEMATOCRIT: 34.2 % — AB (ref 40.0–52.0)
HEMOGLOBIN: 12.2 g/dL — AB (ref 13.0–18.0)
MCH: 33 pg (ref 26.0–34.0)
MCHC: 35.7 g/dL (ref 32.0–36.0)
MCV: 92.4 fL (ref 80.0–100.0)
Platelets: 190 10*3/uL (ref 150–440)
RBC: 3.7 MIL/uL — ABNORMAL LOW (ref 4.40–5.90)
RDW: 14.7 % — AB (ref 11.5–14.5)
WBC: 7.8 10*3/uL (ref 3.8–10.6)

## 2016-03-20 LAB — BASIC METABOLIC PANEL
ANION GAP: 11 (ref 5–15)
BUN: 23 mg/dL — AB (ref 6–20)
CHLORIDE: 100 mmol/L — AB (ref 101–111)
CO2: 26 mmol/L (ref 22–32)
Calcium: 8.8 mg/dL — ABNORMAL LOW (ref 8.9–10.3)
Creatinine, Ser: 1.68 mg/dL — ABNORMAL HIGH (ref 0.61–1.24)
GFR calc Af Amer: 49 mL/min — ABNORMAL LOW (ref >60)
GFR calc non Af Amer: 42 mL/min — ABNORMAL LOW (ref >60)
GLUCOSE: 248 mg/dL — AB (ref 65–99)
POTASSIUM: 4 mmol/L (ref 3.5–5.1)
SODIUM: 137 mmol/L (ref 135–145)

## 2016-03-20 LAB — TROPONIN I

## 2016-03-20 LAB — FIBRIN DERIVATIVES D-DIMER (ARMC ONLY): FIBRIN DERIVATIVES D-DIMER (ARMC): 219 (ref 0–499)

## 2016-03-20 NOTE — ED Notes (Signed)
Patient to ER for c/o generalized weakness and malaise x 2 days.

## 2016-03-20 NOTE — ED Provider Notes (Addendum)
[facility] Emergency Department Provider Note   ____________________________________________  Time seen: Approximately 7:17 PM  I have reviewed the triage vital signs and the nursing notes.   HISTORY  Chief Complaint No chief complaint on file.    HPI [name] [name]. is a 60 y.o. male with CAD status post CABG, chronic chest pain and back pain, hypertension, sleep apnea and hyperlipidemia who presents for evaluation of several weeks of generalized weakness, malaise as well as intermittent shortness of breath, ongoing, mild, shortness of breath is worse when he moves around. No chest pain. No vomiting, diarrhea, fevers or chills. No pain or burning with urination.   Past Medical History  Diagnosis Date  . Diverticulitis   . Hypertension   . Gout   . Sleep apnea   . Hyperlipidemia   . Cardiomyopathy, secondary ([place])   . Coronary artery disease   . Hemorrhoids   . Benign esophageal stricture   . Tubular adenoma of colon     Patient Active Problem List   Diagnosis Date Noted  . Chest pain 02/29/2016  . Unstable angina ([place]) 02/24/2016  . Keloid skin disorder 01/17/2016  . Costochondritis 01/17/2016  . Long term current use of opiate analgesic 12/23/2015  . Long term prescription opiate use 12/23/2015  . Vitamin D insufficiency 11/21/2015  . Lumbar facet syndrome (Bilateral) (R>L) 11/21/2015  . Lumbar spondylosis 11/21/2015  . Elevated sedimentation rate 11/01/2015  . Chronic low back pain (Bilateral) (R>L) 10/25/2015  . Chronic shoulder pain (Location of Tertiary source of pain) (Right) 10/25/2015  . Chronic hip pain (Left) 10/25/2015  . Chronic pain 10/25/2015  . History of alcoholism ([place]) 10/25/2015  . Opiate use 10/25/2015  . Encounter for therapeutic drug level monitoring 10/25/2015  . Chronic chest wall pain (Location of Primary Source of Pain) (Incisional Midline) (since 04/22/2012) 10/25/2015  . Chronic lower extremity pain  (Location of Secondary source of pain) (Bilateral) (R>L) 10/25/2015  . History of MI (myocardial infarction) (January 2014) 10/25/2015  . Encounter for pain management planning 10/25/2015  . Chronic sacroiliac joint pain (Bilateral) (L>R) 10/25/2015  . Neuropathic pain 10/25/2015  . Neurogenic pain 10/25/2015  . Incisional pain 10/25/2015  . HLD (hyperlipidemia) 03/16/2014  . H/O coronary artery bypass surgery 05/12/2012  . Alcohol withdrawal syndrome ([place]) 04/28/2012  . Injury of kidney 04/25/2012  . Obstructive apnea 04/22/2012  . BP (high blood pressure) 04/22/2012  . Arteriosclerosis of coronary artery 04/22/2012  . Apnea, sleep 04/21/2012  . Chronic obstructive pulmonary disease ([place]) 04/21/2012    Past Surgical History  Procedure Laterality Date  . Coronary angioplasty with stent placement      x2  . Cardiac bypass    . Cardiac surgery    . Coronary artery bypass graft    . Colonoscopy with propofol N/A 04/29/2015    Procedure: COLONOSCOPY WITH PROPOFOL;  Surgeon: Hulen Luster, MD;  Location: [facility] ENDOSCOPY;  Service: Gastroenterology;  Laterality: N/A;  . Esophagogastroduodenoscopy (egd) with propofol N/A 04/29/2015    Procedure: ESOPHAGOGASTRODUODENOSCOPY (EGD) WITH PROPOFOL;  Surgeon: Hulen Luster, MD;  Location: [facility] ENDOSCOPY;  Service: Gastroenterology;  Laterality: N/A;  . Septoplasty    . Cardiac catheterization Left 02/03/2016    Procedure: Left Heart Cath and Coronary Angiography;  Surgeon: Teodoro Spray, MD;  Location: [facility] LAB;  Service: Cardiovascular;  Laterality: Left;    Current Outpatient Rx  Name  Route  Sig  Dispense  Refill  . allopurinol (ZYLOPRIM)  300 MG tablet   Oral   Take 300 mg by mouth every evening.          Marland Kitchen amLODipine (NORVASC) 10 MG tablet   Oral   Take 10 mg by mouth every evening.          Marland Kitchen aspirin EC 81 MG tablet   Oral   Take 81 mg by mouth daily.         Marland Kitchen atorvastatin (LIPITOR) 40 MG tablet   Oral   Take 40 mg by  mouth at bedtime.          . cholecalciferol (VITAMIN D) 1000 units tablet   Oral   Take 2,000 Units by mouth daily.         . Cholecalciferol (VITAMIN D3) 2000 units capsule      Take 1 capsule (2,000 Units total) by mouth daily. Patient not taking: Reported on 02/24/2016   30 capsule   PRN     Do not place this medication, or any other prescri ...   . gabapentin (NEURONTIN) 300 MG capsule   Oral   Take 300-900 mg by mouth 4 (four) times daily. Pt takes one capsule three times daily and three capsules at bedtime.         . hydrochlorothiazide (HYDRODIURIL) 25 MG tablet   Oral   Take 25 mg by mouth daily.         Marland Kitchen HYDROcodone-acetaminophen (NORCO/VICODIN) 5-325 MG tablet   Oral   Take 1 tablet by mouth 2 (two) times daily as needed for severe pain.   60 tablet   0     Do not place this medication, or any other prescri ...   . isosorbide mononitrate (IMDUR) 30 MG 24 hr tablet   Oral   Take 30 mg by mouth daily.      1   . losartan (COZAAR) 25 MG tablet   Oral   Take 25 mg by mouth at bedtime.         . metoprolol (LOPRESSOR) 50 MG tablet   Oral   Take 50 mg by mouth 3 (three) times daily.         . Multiple Vitamin (MULTIVITAMIN WITH MINERALS) TABS tablet   Oral   Take 1 tablet by mouth every evening.         . Potassium 95 MG TABS   Oral   Take 95 mg by mouth daily.         . ranolazine (RANEXA) 1000 MG SR tablet   Oral   Take 1 tablet (1,000 mg total) by mouth 2 (two) times daily.   60 tablet   2     Allergies Review of patient's allergies indicates no known allergies.  Family History  Problem Relation Age of Onset  . Heart disease Mother   . Hypertension Father     Social History Social History  Substance Use Topics  . Smoking status: Former Smoker -- 25 years  . Smokeless tobacco: None  . Alcohol Use: 8.4 oz/week    14 Shots of liquor per week     Comment: 14 shot per week    Review of Systems Constitutional: No  fever/chills Eyes: No visual changes. ENT: No sore throat. Cardiovascular: Denies chest pain. Respiratory: + shortness of breath. Gastrointestinal: No abdominal pain.  No nausea, no vomiting.  No diarrhea.  No constipation. Genitourinary: Negative for dysuria. Musculoskeletal: Negative for back pain. Skin: Negative for rash. Neurological: Negative for headaches, focal  weakness or numbness.  10-point ROS otherwise negative.  ____________________________________________   PHYSICAL EXAM:  Filed Vitals:   03/20/16 1638 03/20/16 1907 03/20/16 2119  BP: 109/68 122/71 111/80  Pulse: 90 81 82  Temp: 98 F (36.7 C)    TempSrc: Oral    Resp: 20 18 18   Height: 5\' 3"  (1.6 m)    Weight: 170 lb (77.111 kg)    SpO2: 95% 100% 100%    VITAL SIGNS: ED Triage Vitals  Enc Vitals Group     BP 03/20/16 1638 109/68 mmHg     Pulse Rate 03/20/16 1638 90     Resp 03/20/16 1638 20     Temp 03/20/16 1638 98 F (36.7 C)     Temp Source 03/20/16 1638 Oral     SpO2 03/20/16 1638 95 %     Weight 03/20/16 1638 170 lb (77.111 kg)     Height 03/20/16 1638 5\' 3"  (1.6 m)     Head Cir --      Peak Flow --      Pain Score 03/20/16 1639 0     Pain Loc --      Pain Edu? --      Excl. in [place]? --     Constitutional: Alert and oriented. Well appearing and in no acute distress. Sitting up in bed working on a crossword puzzle, in NAD. Eyes: Conjunctivae are normal. PERRL. EOMI. Head: Atraumatic. Nose: No congestion/rhinnorhea. Mouth/Throat: Mucous membranes are moist.  Oropharynx non-erythematous. Neck: No stridor.   Cardiovascular: Normal rate, regular rhythm. Grossly normal heart sounds.  Good peripheral circulation. Respiratory: Normal respiratory effort.  No retractions. Lungs CTAB. Gastrointestinal: Soft and nontender. No distention.  No CVA tenderness. Genitourinary: deferred Musculoskeletal: No lower extremity tenderness nor edema.  No joint effusions. Neurologic:  Normal speech and language.  No gross focal neurologic deficits are appreciated. No gait instability. Skin:  Skin is warm, dry and intact. No rash noted. Psychiatric: Mood and affect are normal. Speech and behavior are normal.  ____________________________________________   LABS (all labs ordered are listed, but only abnormal results are displayed)  Labs Reviewed  BASIC METABOLIC PANEL - Abnormal; Notable for the following:    Chloride 100 (*)    Glucose, Bld 248 (*)    BUN 23 (*)    Creatinine, Ser 1.68 (*)    Calcium 8.8 (*)    GFR calc non Af Amer 42 (*)    GFR calc Af Amer 49 (*)    All other components within normal limits  CBC - Abnormal; Notable for the following:    RBC 3.70 (*)    Hemoglobin 12.2 (*)    HCT 34.2 (*)    RDW 14.7 (*)    All other components within normal limits  URINALYSIS COMPLETEWITH MICROSCOPIC ([facility] ONLY) - Abnormal; Notable for the following:    Color, Urine STRAW (*)    APPearance CLEAR (*)    Glucose, UA >500 (*)    All other components within normal limits  TROPONIN I  FIBRIN DERIVATIVES D-DIMER ([facility] ONLY)  TROPONIN I   ____________________________________________  EKG  ED ECG REPORT I, Joanne Gavel, the attending physician, personally viewed and interpreted this ECG.   Date: 03/20/2016  EKG Time: 16:42  Rate: 87  Rhythm: normal sinus rhythm  Axis: normal  Intervals:none  ST&T Change: No acute ST elevation or acute ST depression. Nonspecific T wave abnormality in the inferior and lateral leads. EKG is unchanged from 02/28/2016.  ____________________________________________  RADIOLOGY  CXR  IMPRESSION: No active cardiopulmonary disease. ____________________________________________   PROCEDURES  Procedure(s) performed: None  Procedures  Critical Care performed: No  ____________________________________________   INITIAL IMPRESSION / ASSESSMENT AND PLAN / ED COURSE  Pertinent labs & imaging results that were available during my care of the  patient were reviewed by me and considered in my medical decision making (see chart for details).  Margaretha Seeds. is a 60 y.o. male with CAD status post CABG, chronic chest pain and back pain, hypertension, sleep apnea and hyperlipidemia who presents for evaluation of several weeks of generalized weakness as well as intermittent shortness of breath. On exam, he is well-appearing and in no acute distress, vital signs stable, he is afebrile. His lungs are clear to auscultation, no increase work of breathing, no tachypnea, no hypoxia. His symptoms have been ongoing for several weeks and appear chronic. EKG negative with acute ischemia. Chest x-ray shows no active cardio pulmonary disease. BMP shows elevation at 1.68, mildly increased from 1.26 two weeks ago, encourage PO hydration. CBC with mild anemia, hemoglobin 12.2. Initial troponin is negative however will obtain a second troponin to rule out atypical ACS presentation and reassess for disposition. We'll obtain a d-dimer.  ----------------------------------------- 10:00 PM on 03/20/2016 ----------------------------------------- The patient continues to rest comfortably in no distress. He ambulates well with O2 sat of greater than 95% persistently. He has no oxygen requirement, still with no increased work of breathing or any obvious respiratory distress. 2 sets of cardiac enzymes/troponins are negative, I doubt that this represents atypical ACS presentation. D-dimer is not elevated, doubt PE, no clinical evidence for DVT on his exam. We discussed return precautions, need for close PCP follow-up. He is already following up with his doctor tomorrow for a previously scheduled appointment. I encouraged him to keep that appointment. DC with return precautions. ____________________________________________   FINAL CLINICAL IMPRESSION(S) / ED DIAGNOSES  Final diagnoses:  Generalized weakness  SOB (shortness of breath)      NEW MEDICATIONS STARTED  DURING THIS VISIT:  New Prescriptions   No medications on file     Note:  This document was prepared using Dragon voice recognition software and may include unintentional dictation errors.    Joanne Gavel, MD 03/20/16 [id number]  Joanne Gavel, MD 03/20/16 2202

## 2016-03-20 NOTE — ED Notes (Signed)
Pt ambulated in hall with walker per Dr. Edd Fabian request. Oxygen saturation started at 93% and quickly went up to 99% and 100%.

## 2016-03-20 NOTE — ED Notes (Signed)
Pt is doing a crossword puzzle during assessment.  Pt states he is here because "it's like I can't catch my breath sometimes." "Its been that as well as when I get up I feel like I'm gonna fall." Pt uses a walker, it is at bedside." "Sometimes it's there and sometimes it's not there."

## 2016-03-20 NOTE — ED Notes (Signed)
Pt ambulatory to toilet with walker and no assistance.

## 2016-03-21 ENCOUNTER — Ambulatory Visit
Admission: RE | Admit: 2016-03-21 | Discharge: 2016-03-21 | Disposition: A | Discharge status: Home / Self Care | Payer: Medicare Other | Source: Ambulatory Visit | Attending: Cardiology | Admitting: Cardiology

## 2016-03-21 DIAGNOSIS — R918 Other nonspecific abnormal finding of lung field: Secondary | ICD-10-CM | POA: Diagnosis not present

## 2016-03-21 DIAGNOSIS — I25709 Atherosclerosis of coronary artery bypass graft(s), unspecified, with unspecified angina pectoris: Secondary | ICD-10-CM | POA: Insufficient documentation

## 2016-03-21 DIAGNOSIS — I700 Atherosclerosis of aorta: Secondary | ICD-10-CM | POA: Insufficient documentation

## 2016-03-21 DIAGNOSIS — R079 Chest pain, unspecified: Secondary | ICD-10-CM

## 2016-03-21 MED ORDER — IOPAMIDOL (ISOVUE-370) INJECTION 76%
75.0000 mL | Freq: Once | INTRAVENOUS | Status: AC | PRN
  Administered 2016-03-21: 75 mL via INTRAVENOUS

## 2016-03-29 ENCOUNTER — Ambulatory Visit: Payer: Medicare Other | Admitting: Pain Medicine

## 2016-03-30 DIAGNOSIS — Z951 Presence of aortocoronary bypass graft: Secondary | ICD-10-CM | POA: Insufficient documentation

## 2016-04-02 DIAGNOSIS — R531 Weakness: Secondary | ICD-10-CM | POA: Insufficient documentation

## 2016-04-04 DIAGNOSIS — I6529 Occlusion and stenosis of unspecified carotid artery: Secondary | ICD-10-CM | POA: Insufficient documentation

## 2016-04-04 DIAGNOSIS — E8809 Other disorders of plasma-protein metabolism, not elsewhere classified: Secondary | ICD-10-CM | POA: Insufficient documentation

## 2016-04-09 DIAGNOSIS — S143XXA Injury of brachial plexus, initial encounter: Secondary | ICD-10-CM | POA: Insufficient documentation

## 2016-04-12 ENCOUNTER — Encounter
Admission: RE | Admit: 2016-04-12 | Discharge: 2016-04-12 | Disposition: A | Discharge status: Home / Self Care | Payer: Medicare Other | Source: Ambulatory Visit | Attending: Internal Medicine | Admitting: Internal Medicine

## 2016-04-12 LAB — GLUCOSE, CAPILLARY
GLUCOSE-CAPILLARY: 152 mg/dL — AB (ref 65–99)
Glucose-Capillary: 109 mg/dL — ABNORMAL HIGH (ref 65–99)

## 2016-04-13 LAB — GLUCOSE, CAPILLARY
GLUCOSE-CAPILLARY: 126 mg/dL — AB (ref 65–99)
GLUCOSE-CAPILLARY: 135 mg/dL — AB (ref 65–99)
GLUCOSE-CAPILLARY: 116 mg/dL — AB (ref 65–99)
Glucose-Capillary: 127 mg/dL — ABNORMAL HIGH (ref 65–99)

## 2016-04-14 LAB — GLUCOSE, CAPILLARY
GLUCOSE-CAPILLARY: 137 mg/dL — AB (ref 65–99)
Glucose-Capillary: 114 mg/dL — ABNORMAL HIGH (ref 65–99)
Glucose-Capillary: 113 mg/dL — ABNORMAL HIGH (ref 65–99)
Glucose-Capillary: 125 mg/dL — ABNORMAL HIGH (ref 65–99)

## 2016-04-15 LAB — GLUCOSE, CAPILLARY
GLUCOSE-CAPILLARY: 131 mg/dL — AB (ref 65–99)
GLUCOSE-CAPILLARY: 124 mg/dL — AB (ref 65–99)
Glucose-Capillary: 136 mg/dL — ABNORMAL HIGH (ref 65–99)
Glucose-Capillary: 154 mg/dL — ABNORMAL HIGH (ref 65–99)

## 2016-04-16 LAB — GLUCOSE, CAPILLARY
GLUCOSE-CAPILLARY: 142 mg/dL — AB (ref 65–99)
GLUCOSE-CAPILLARY: 130 mg/dL — AB (ref 65–99)
GLUCOSE-CAPILLARY: 148 mg/dL — AB (ref 65–99)
Glucose-Capillary: 153 mg/dL — ABNORMAL HIGH (ref 65–99)

## 2016-04-17 LAB — GLUCOSE, CAPILLARY
GLUCOSE-CAPILLARY: 122 mg/dL — AB (ref 65–99)
GLUCOSE-CAPILLARY: 134 mg/dL — AB (ref 65–99)
GLUCOSE-CAPILLARY: 108 mg/dL — AB (ref 65–99)
Glucose-Capillary: 167 mg/dL — ABNORMAL HIGH (ref 65–99)

## 2016-04-18 LAB — GLUCOSE, CAPILLARY
GLUCOSE-CAPILLARY: 130 mg/dL — AB (ref 65–99)
GLUCOSE-CAPILLARY: 140 mg/dL — AB (ref 65–99)
GLUCOSE-CAPILLARY: 224 mg/dL — AB (ref 65–99)
Glucose-Capillary: 117 mg/dL — ABNORMAL HIGH (ref 65–99)
Glucose-Capillary: 151 mg/dL — ABNORMAL HIGH (ref 65–99)

## 2016-04-19 LAB — GLUCOSE, CAPILLARY
GLUCOSE-CAPILLARY: 149 mg/dL — AB (ref 65–99)
GLUCOSE-CAPILLARY: 124 mg/dL — AB (ref 65–99)
Glucose-Capillary: 134 mg/dL — ABNORMAL HIGH (ref 65–99)
Glucose-Capillary: 142 mg/dL — ABNORMAL HIGH (ref 65–99)

## 2016-04-20 LAB — GLUCOSE, CAPILLARY
GLUCOSE-CAPILLARY: 137 mg/dL — AB (ref 65–99)
GLUCOSE-CAPILLARY: 144 mg/dL — AB (ref 65–99)
Glucose-Capillary: 113 mg/dL — ABNORMAL HIGH (ref 65–99)
Glucose-Capillary: 121 mg/dL — ABNORMAL HIGH (ref 65–99)

## 2016-04-21 LAB — GLUCOSE, CAPILLARY
Glucose-Capillary: 132 mg/dL — ABNORMAL HIGH (ref 65–99)
Glucose-Capillary: 120 mg/dL — ABNORMAL HIGH (ref 65–99)
Glucose-Capillary: 116 mg/dL — ABNORMAL HIGH (ref 65–99)
Glucose-Capillary: 124 mg/dL — ABNORMAL HIGH (ref 65–99)

## 2016-04-22 LAB — GLUCOSE, CAPILLARY
GLUCOSE-CAPILLARY: 109 mg/dL — AB (ref 65–99)
GLUCOSE-CAPILLARY: 118 mg/dL — AB (ref 65–99)
Glucose-Capillary: 134 mg/dL — ABNORMAL HIGH (ref 65–99)
Glucose-Capillary: 151 mg/dL — ABNORMAL HIGH (ref 65–99)

## 2016-04-23 ENCOUNTER — Non-Acute Institutional Stay (SKILLED_NURSING_FACILITY): Payer: Medicare Other | Admitting: Gerontology

## 2016-04-23 DIAGNOSIS — Z48812 Encounter for surgical aftercare following surgery on the circulatory system: Secondary | ICD-10-CM | POA: Diagnosis not present

## 2016-04-23 LAB — GLUCOSE, CAPILLARY: GLUCOSE-CAPILLARY: 128 mg/dL — AB (ref 65–99)

## 2016-04-24 NOTE — Progress Notes (Signed)
Location:      Place of Service:  SNF (31)  Provider: Toni Arthurs, NP-C  PCP: Elisabeth Cara, NP Patient Care Team: Elisabeth Cara, NP as PCP - General (Nurse Practitioner)  Extended Emergency Contact Information Primary Emergency Contact: Efthimiadis,Lauretta Address: [place], [place] [address] [address] of [place] Phone: [phone] Mobile Phone: [phone] Relation: Sister  Code Status: full Goals of care:  Advanced Directive information Advanced Directives 02/28/2016  Does patient have an advance directive? No  Would patient like information on creating an advanced directive? -     No Known Allergies  Chief Complaint  Patient presents with  . Discharge Note    HPI:  60 y.o. male at the facility for rehab following revision of a CABG. Pt has been walking the halls independently. Pain is controlled. Appetite good. Voiding/ BMs. Pt was scheduled to have a care plan meeting today. However, this morning, he informed SW that he was ready to go and wanted to go home even if it meant going AMA. However, since he is independent in self care and ambulation, the decision was made to discharge pt home rather than AMA. However, he has been non-compliant with sternal precautions. Upon examination, it was discovered that his staples and sutures were still in place. Surgery was over 3 weeks ago. Discharge instructions from the hospital did not include instructions for staple removal, date, etc.     Past Medical History:  Diagnosis Date  . Benign esophageal stricture   . Cardiomyopathy, secondary ([place])   . Coronary artery disease   . Diverticulitis   . Gout   . Hemorrhoids   . Hyperlipidemia   . Hypertension   . Sleep apnea   . Tubular adenoma of colon     Past Surgical History:  Procedure Laterality Date  . cardiac bypass    . CARDIAC CATHETERIZATION Left 02/03/2016   Procedure: Left Heart Cath and Coronary Angiography;  Surgeon:  Teodoro Spray, MD;  Location: [facility];  Service: Cardiovascular;  Laterality: Left;  . CARDIAC SURGERY    . COLONOSCOPY WITH PROPOFOL N/A 04/29/2015   Procedure: COLONOSCOPY WITH PROPOFOL;  Surgeon: Hulen Luster, MD;  Location: [facility] ENDOSCOPY;  Service: Gastroenterology;  Laterality: N/A;  . CORONARY ANGIOPLASTY WITH STENT PLACEMENT     x2  . CORONARY ARTERY BYPASS GRAFT    . ESOPHAGOGASTRODUODENOSCOPY (EGD) WITH PROPOFOL N/A 04/29/2015   Procedure: ESOPHAGOGASTRODUODENOSCOPY (EGD) WITH PROPOFOL;  Surgeon: Hulen Luster, MD;  Location: [facility] ENDOSCOPY;  Service: Gastroenterology;  Laterality: N/A;  . SEPTOPLASTY        reports that he has quit smoking. He quit after 25.00 years of use. He does not have any smokeless tobacco history on file. He reports that he drinks about 8.4 oz of alcohol per week . He reports that he does not use drugs. Social History   Social History  . Marital status: Single    Spouse name: N/A  . Number of children: N/A  . Years of education: N/A   Occupational History  . retired    Social History Main Topics  . Smoking status: Former Smoker    Years: 25.00  . Smokeless tobacco: Not on file  . Alcohol use 8.4 oz/week    14 Shots of liquor per week     Comment: 14 shot per week  . Drug use: No  . Sexual activity: Not on file   Other  Topics Concern  . Not on file   Social History Narrative  . No narrative on file   Functional Status Survey:    No Known Allergies  Pertinent  Health Maintenance Due  Topic Date Due  . INFLUENZA VACCINE  04/03/2016  . COLONOSCOPY  04/28/2025    Medications:   Medication List       Accurate as of 04/23/16 11:59 PM. Always use your most recent med list.          allopurinol 300 MG tablet Commonly known as:  ZYLOPRIM Take 300 mg by mouth every evening.   amLODipine 10 MG tablet Commonly known as:  NORVASC Take 10 mg by mouth every evening.   aspirin EC 81 MG tablet Take 81 mg by mouth daily.     atorvastatin 40 MG tablet Commonly known as:  LIPITOR Take 40 mg by mouth at bedtime.   gabapentin 300 MG capsule Commonly known as:  NEURONTIN Take 300-900 mg by mouth 4 (four) times daily. Pt takes one capsule three times daily and three capsules at bedtime.   hydrochlorothiazide 25 MG tablet Commonly known as:  HYDRODIURIL Take 25 mg by mouth daily.   HYDROcodone-acetaminophen 5-325 MG tablet Commonly known as:  NORCO/VICODIN Take 1 tablet by mouth 2 (two) times daily as needed for severe pain.   isosorbide mononitrate 30 MG 24 hr tablet Commonly known as:  IMDUR Take 30 mg by mouth daily.   losartan 25 MG tablet Commonly known as:  COZAAR Take 25 mg by mouth at bedtime.   metoprolol 50 MG tablet Commonly known as:  LOPRESSOR Take 50 mg by mouth 3 (three) times daily.   multivitamin with minerals Tabs tablet Take 1 tablet by mouth every evening.   Potassium 95 MG Tabs Take 95 mg by mouth daily.   ranolazine 1000 MG SR tablet Commonly known as:  RANEXA Take 1 tablet (1,000 mg total) by mouth 2 (two) times daily.   cholecalciferol 1000 units tablet Commonly known as:  VITAMIN D Take 2,000 Units by mouth daily.   Vitamin D3 2000 units capsule Take 1 capsule (2,000 Units total) by mouth daily.       Review of Systems  Constitutional: Negative for activity change, appetite change, chills, diaphoresis and fever.  HENT: Negative for congestion, sneezing, sore throat, trouble swallowing and voice change.   Eyes: Negative for pain, redness and visual disturbance.  Respiratory: Negative for apnea, cough, choking, chest tightness, shortness of breath and wheezing.   Cardiovascular: Positive for chest pain (sternal/ muscular pain). Negative for palpitations and leg swelling.  Gastrointestinal: Negative for abdominal distention, abdominal pain, constipation, diarrhea and nausea.  Genitourinary: Negative for difficulty urinating, dysuria, frequency and urgency.   Musculoskeletal: Negative for back pain, gait problem and myalgias. Arthralgias: typical arthritis.  Skin: Positive for wound (sternal incision). Negative for color change, pallor and rash.  Neurological: Negative for dizziness, tremors, syncope, speech difficulty, weakness, numbness and headaches.  Psychiatric/Behavioral: Negative for agitation and behavioral problems.  All other systems reviewed and are negative.   Vitals:   04/23/16 0500  BP: 135/86  Pulse: 85  Resp: (!) 22  Temp: (!) 96.6 F (35.9 C)  SpO2: 94%  Weight: 140 lb 8 oz (63.7 kg)   Body mass index is 24.89 kg/m. Physical Exam  Constitutional: He is oriented to person, place, and time. Vital signs are normal. He appears well-developed and well-nourished. He is active and cooperative. He does not appear ill. No distress.  HENT:  Head: Normocephalic and atraumatic.  Mouth/Throat: Uvula is midline, oropharynx is clear and moist and mucous membranes are normal. Mucous membranes are not pale, not dry and not cyanotic.  Eyes: Conjunctivae, EOM and lids are normal. Pupils are equal, round, and reactive to light.  Neck: Trachea normal, normal range of motion and full passive range of motion without pain. Neck supple. No JVD present. No tracheal deviation, no edema and no erythema present. No thyromegaly present.  Cardiovascular: Normal rate, regular rhythm, normal heart sounds, intact distal pulses and normal pulses.  Exam reveals no gallop, no distant heart sounds and no friction rub.   No murmur heard. Pulmonary/Chest: Effort normal and breath sounds normal. No accessory muscle usage. No respiratory distress. He has no wheezes. He exhibits no tenderness.  Abdominal: Normal appearance and bowel sounds are normal. He exhibits no distension and no ascites. There is no tenderness.  Musculoskeletal: Normal range of motion. He exhibits no edema or tenderness.  Expected osteoarthritis, stiffness  Neurological: He is alert and  oriented to person, place, and time. He has normal strength.  Skin: Skin is warm and dry. Laceration (sternal incision) noted. No rash noted. He is not diaphoretic. No cyanosis or erythema. No pallor. Nails show no clubbing.     Psychiatric: He has a normal mood and affect. His speech is normal and behavior is normal. Judgment and thought content normal. Cognition and memory are normal.  Nursing note and vitals reviewed.   Labs reviewed: Basic Metabolic Panel:  Recent Labs  10/26/15 1306  02/28/16 2000 02/29/16 0517 03/20/16 1649  NA 137  < > 132* 131* 137  K 3.5  < > 3.8 3.7 4.0  CL 100*  < > 99* 100* 100*  CO2 28  < > 20* 22 26  GLUCOSE 145*  < > 239* 201* 248*  BUN 17  < > 56* 35* 23*  CREATININE 1.27*  < > 2.22* 1.26* 1.68*  CALCIUM 9.1  < > 8.7* 8.1* 8.8*  MG 2.0  --   --   --   --   < > = values in this interval not displayed. Liver Function Tests:  Recent Labs  10/26/15 1306 02/24/16 1513  AST 66* 39  ALT 75* 65*  ALKPHOS 87 69  BILITOT 1.2 1.2  PROT 8.0 7.5  ALBUMIN 4.4 4.3   No results for input(s): LIPASE, AMYLASE in the last 8760 hours. No results for input(s): AMMONIA in the last 8760 hours. CBC:  Recent Labs  02/24/16 1513  02/28/16 2000 02/29/16 0517 03/20/16 1649  WBC 13.4*  < > 15.8* 10.7* 7.8  NEUTROABS 11.2*  --   --   --   --   HGB 13.2  < > 13.7 13.0 12.2*  HCT 35.6*  < > 38.3* 36.8* 34.2*  MCV 88.6  < > 89.4 89.9 92.4  PLT 180  < > 205 143* 190  < > = values in this interval not displayed. Cardiac Enzymes:  Recent Labs  02/29/16 1005 03/20/16 1649 03/20/16 1956  TROPONINI <0.03 <0.03 <0.03   BNP: Invalid input(s): POCBNP CBG:  Recent Labs  04/22/16 1724 04/22/16 2001 04/23/16 0806  GLUCAP 109* 118* 128*    Procedures and Imaging Studies During Stay: No results found.  Assessment/Plan:   1. Aftercare following surgery of the circulatory system  Sternal precautions (pt has been non-compliant)  Pain control- no rx  given  TCDB/ IS  D/C staples, sutures prior to discharge. Apply steri-strips if needed.  Patient is being discharged with the following home health services:  none  Patient is being discharged with the following durable medical equipment:  none  Patient has been advised to f/u with their PCP in 1-2 weeks to bring them up to date on their rehab stay.  Social services at facility was responsible for arranging this appointment.  Pt was provided with a 30 day supply of prescriptions for medications and refills must be obtained from their PCP.  For controlled substances, a more limited supply may be provided adequate until PCP appointment only.  Future labs/tests needed:  Per PCP, surgeon  Family/ staff Communication:   Total Time: 60 minutes  Documentation: 40 minutes, including research of D/C summary for further instructions  Face to Face: 20 minutes  Family/Phone:  Vikki Ports, NP-C [facility] [address], [place] [address] Cell Phone (Mon-Fri 8am-5pm):  [phone] On Call:  [phone] & follow prompts after 5pm & weekends Office Phone:  [phone] Office Fax:  [phone]

## 2016-04-30 ENCOUNTER — Encounter: Payer: Medicare Other | Admitting: Pain Medicine

## 2016-05-01 DIAGNOSIS — E876 Hypokalemia: Secondary | ICD-10-CM | POA: Insufficient documentation

## 2016-05-17 ENCOUNTER — Encounter: Payer: Self-pay | Admitting: Pain Medicine

## 2016-05-17 ENCOUNTER — Ambulatory Visit: Payer: Medicare Other | Attending: Pain Medicine | Admitting: Pain Medicine

## 2016-05-17 VITALS — BP 133/72 | HR 75 | Temp 98.0°F | Resp 15 | Ht 63.0 in | Wt 154.0 lb

## 2016-05-17 DIAGNOSIS — K5900 Constipation, unspecified: Secondary | ICD-10-CM | POA: Insufficient documentation

## 2016-05-17 DIAGNOSIS — R0789 Other chest pain: Secondary | ICD-10-CM

## 2016-05-17 DIAGNOSIS — M25511 Pain in right shoulder: Secondary | ICD-10-CM

## 2016-05-17 DIAGNOSIS — I252 Old myocardial infarction: Secondary | ICD-10-CM | POA: Insufficient documentation

## 2016-05-17 DIAGNOSIS — M940 Chondrocostal junction syndrome [Tietze]: Secondary | ICD-10-CM | POA: Insufficient documentation

## 2016-05-17 DIAGNOSIS — Z79891 Long term (current) use of opiate analgesic: Secondary | ICD-10-CM | POA: Insufficient documentation

## 2016-05-17 DIAGNOSIS — F1190 Opioid use, unspecified, uncomplicated: Secondary | ICD-10-CM | POA: Diagnosis not present

## 2016-05-17 DIAGNOSIS — Z951 Presence of aortocoronary bypass graft: Secondary | ICD-10-CM | POA: Diagnosis not present

## 2016-05-17 DIAGNOSIS — Z79899 Other long term (current) drug therapy: Secondary | ICD-10-CM | POA: Insufficient documentation

## 2016-05-17 DIAGNOSIS — E785 Hyperlipidemia, unspecified: Secondary | ICD-10-CM | POA: Diagnosis not present

## 2016-05-17 DIAGNOSIS — M79606 Pain in leg, unspecified: Secondary | ICD-10-CM | POA: Diagnosis not present

## 2016-05-17 DIAGNOSIS — E876 Hypokalemia: Secondary | ICD-10-CM | POA: Diagnosis not present

## 2016-05-17 DIAGNOSIS — I6529 Occlusion and stenosis of unspecified carotid artery: Secondary | ICD-10-CM | POA: Diagnosis not present

## 2016-05-17 DIAGNOSIS — M792 Neuralgia and neuritis, unspecified: Secondary | ICD-10-CM

## 2016-05-17 DIAGNOSIS — K5903 Drug induced constipation: Secondary | ICD-10-CM

## 2016-05-17 DIAGNOSIS — S143XXA Injury of brachial plexus, initial encounter: Secondary | ICD-10-CM | POA: Diagnosis not present

## 2016-05-17 DIAGNOSIS — E559 Vitamin D deficiency, unspecified: Secondary | ICD-10-CM | POA: Diagnosis not present

## 2016-05-17 DIAGNOSIS — Z955 Presence of coronary angioplasty implant and graft: Secondary | ICD-10-CM | POA: Diagnosis not present

## 2016-05-17 DIAGNOSIS — G8929 Other chronic pain: Secondary | ICD-10-CM | POA: Diagnosis not present

## 2016-05-17 DIAGNOSIS — J449 Chronic obstructive pulmonary disease, unspecified: Secondary | ICD-10-CM | POA: Diagnosis not present

## 2016-05-17 DIAGNOSIS — T402X5A Adverse effect of other opioids, initial encounter: Secondary | ICD-10-CM | POA: Insufficient documentation

## 2016-05-17 MED ORDER — HYDROCODONE-ACETAMINOPHEN 5-325 MG PO TABS: 1.0000 | ORAL_TABLET | Freq: Two times a day (BID) | ORAL | 0 refills | Status: DC | PRN

## 2016-05-17 NOTE — Progress Notes (Signed)
Patient's Name: [name] [name].  MRN: [id number]  Referring Provider: Elisabeth Cara, NP  DOB: 06/30/1956  PCP: Elisabeth Cara, NP  DOS: 05/17/2016  Note by: Kathlen Brunswick. Dossie Arbour, MD  Service setting: Ambulatory outpatient  Specialty: Interventional Pain Management  Location: [facility] (AMB) Pain Management Facility    Patient type: Established   Primary Reason(s) for Visit: Encounter for prescription drug management (Level of risk: moderate) CC: Shoulder Pain (right side); Chest Pain (due to surgery ); and Extremity Weakness (left  took vein from leg)  HPI  [name] [name] is a 60 y.o. year old, male patient, who returns today as an established patient. He has Apnea, sleep; H/O coronary artery bypass surgery; Obstructive apnea; BP (high blood pressure); HLD (hyperlipidemia); Arteriosclerosis of coronary artery; Chronic obstructive pulmonary disease ([place]); Alcohol withdrawal syndrome ([place]); Injury of kidney; Chronic low back pain (Bilateral) (R>L); Chronic shoulder pain (Location of Tertiary source of pain) (Right); Chronic hip pain (Left); Chronic pain; History of alcoholism ([place]); Opiate use; Encounter for therapeutic drug level monitoring; Chronic chest wall pain (Location of Primary Source of Pain) (Incisional Midline) (since 04/22/2012); Chronic lower extremity pain (Location of Secondary source of pain) (Bilateral) (R>L); History of MI (myocardial infarction) (January 2014); Encounter for pain management planning; Chronic sacroiliac joint pain (Bilateral) (L>R); Neuropathic pain; Neurogenic pain; Incisional pain; Elevated sedimentation rate; Vitamin D insufficiency; Lumbar facet syndrome (Bilateral) (R>L); Lumbar spondylosis; Long term current use of opiate analgesic; Long term prescription opiate use; Keloid skin disorder; Costochondritis; Unstable angina ([place]); Chest pain; Brachial plexus injury, right; Carotid atherosclerosis; Hypoalbuminemia; Hypokalemia; Right sided weakness; S/P CABG (coronary artery  bypass graft); and Opioid-induced constipation (OIC) on his problem list.. His primarily concern today is the Shoulder Pain (right side); Chest Pain (due to surgery ); and Extremity Weakness (left  took vein from leg)  Pain Assessment: Self-Reported Pain Score: 4  Clinically the patient looks like a 2/10 Reported level is inconsistent with clinical observations. Information on the proper use of the pain score provided to the patient today. Pain Type: Chronic pain Pain Location: Leg (lower) Pain Orientation: Lower Pain Descriptors / Indicators: Constant, Aching, Burning, Throbbing Pain Frequency: Constant  The patient comes into the clinics today for pharmacological management of his chronic pain. I last saw this patient on 02/21/2016. The patient  reports that he does not use drugs. His body mass index is 27.28 kg/m.  Date of Last Visit: 02/21/16 Service Provided on Last Visit: Procedure  Controlled Substance Pharmacotherapy Assessment & REMS (Risk Evaluation and Mitigation Strategy)  Analgesic: Hydrocodone/APAP 5/325 one tablet twice a day (10 mg per day) MME/day: 10 mg/day.  Pill Count: Was in hospital for heart bypass. Was given narcotics in hospital  Hydrocodone count 22/60 filled on 04/27/16. Pharmacokinetics: Onset of action (Liberation/Absorption): Within expected pharmacological parameters Time to Peak effect (Distribution): Timing and results are as within normal expected parameters Duration of action (Metabolism/Excretion): Within normal limits for medication Pharmacodynamics: Analgesic Effect: More than 50% Activity Facilitation: Medication(s) allow patient to sit, stand, walk, and do the basic ADLs Perceived Effectiveness: Described as relatively effective, allowing for increase in activities of daily living (ADL) Side-effects or Adverse reactions: None reported Monitoring: [place] PMP: Online review of the past 85-month period conducted. Compliant with practice rules and  regulations Last UDS on record: Tox[employer]ure Select 13  Date Value Ref Range Status  01/16/2016 FINAL  Final    Comment:    ==================================================================== TOXASSURE SELECT 13 (MW) ==================================================================== Test  Result       Flag       Units Drug Present and Declared for Prescription Verification   Hydrocodone                    700          EXPECTED   ng/mg creat   Hydromorphone                  168          EXPECTED   ng/mg creat   Dihydrocodeine                 43           EXPECTED   ng/mg creat   Norhydrocodone                 271          EXPECTED   ng/mg creat    Sources of hydrocodone include scheduled prescription    medications. Hydromorphone, dihydrocodeine and norhydrocodone are    expected metabolites of hydrocodone. Hydromorphone and    dihydrocodeine are also available as scheduled prescription    medications. ==================================================================== Test                      Result    Flag   Units      Ref Range   Creatinine              120              mg/dL      >=20 ==================================================================== Declared Medications:  The flagging and interpretation on this report are based on the  following declared medications.  Unexpected results may arise from  inaccuracies in the declared medications.  **Note: The testing scope of this panel includes these medications:  Hydrocodone (Hydrocodone-Acetaminophen)  **Note: The testing scope of this panel does not include following  reported medications:  Acetaminophen (Hydrocodone-Acetaminophen)  Allopurinol  Amlodipine  Aspirin  Atorvastatin  Cholecalciferol  Gabapentin  Hydrochlorothiazide  Losartan (Losartan Potassium)  Meloxicam  Metoprolol  Multivitamin  Vitamin D2  (Ergocalciferol) ==================================================================== For clinical consultation, please call [phone]. ====================================================================    UDS interpretation: Compliant          Medication Assessment Form: Reviewed. Patient indicates being compliant with therapy Treatment compliance: Compliant Risk Assessment: Aberrant Behavior: None observed today Substance Use Disorder (SUD) Risk Level: Low-to-moderate Risk of opioid abuse or dependence: 0.7-3.0% with doses ? 36 MME/day and 6.1-26% with doses ? 120 MME/day. Depression Scale Score: PHQ-2: PHQ-2 Total Score: 0 No depression (0) PHQ-9: PHQ-9 Total Score: 0 No depression (0-4)  Pharmacologic Plan: No change in therapy, at this time  Post-Procedure Assessment  Procedure done on 02/21/2016: Diagnostic bilateral lumbar facet block + diagnostic bilateral sacroiliac joint block under fluoroscopic guidance and IV sedation. Complications experienced at the time of the procedure: None Side-effects or Adverse reactions: None reported Sedation: Sedation provided. When no sedatives are used, the analgesic levels obtained are directly associated with the effectiveness of the local anesthetics. On the other hand, when sedation is provided, the level of analgesia obtained during the initial 1 hour immediately following the intervention, is believed to be the result of a combination of factors. These factors may include, but are not limited to: 1. The effectiveness of the local anesthetics used. 2. The effects of the analgesic(s) and/or anxiolytic(s) used. 3. The degree of discomfort experienced by the patient  at the time of the procedure. 4. The patients ability and reliability in recalling and recording the events. 5. The presence and influence of possible secondary gains. Results: Relief during the 1st hour after the procedure: 100 % (Ultra-Short Term Relief) Interpretation  note: Analgesia during this period is likely to be Local Anesthetic and/or IV Sedative (Analgesic/Anxiolitic) related Local Anesthesia: Long-acting (4-6 hours) anesthetics used. The analgesic levels attained during this period are directly associated to the localized infiltration of local anesthetics and therefore cary significant diagnostic value as to the etiological location or origin of the pain. Results: Relief during the next 4 to 6 hour after the procedure:100 % (Short Term Relief) Interpretation note: Complete relief confirms area to be the source of pain Long-Term Therapy: Steroids used. Results: Extended relief following procedure: 80 % (didn't help the leg  helped back for 2 weekf) Interpretation note: Long-term benefit would suggest an inflammatory etiology to the pain         Long-Term Benefits:  Current Relief (Now): 75%  Interpretation note: Persistent relief would suggest effective anti-inflammatory effects from steroids Interpretation of Results: This would suggest this therapy to be a good palliative way of controlling his low back pain.  Laboratory Chemistry  Inflammation Markers Lab Results  Component Value Date   ESRSEDRATE 32 (H) 10/26/2015   CRP <0.5 10/26/2015    Renal Function Lab Results  Component Value Date   BUN 23 (H) 03/20/2016   CREATININE 1.68 (H) 03/20/2016   GFRAA 49 (L) 03/20/2016   GFRNONAA 42 (L) 03/20/2016    Hepatic Function Lab Results  Component Value Date   AST 39 02/24/2016   ALT 65 (H) 02/24/2016   ALBUMIN 4.3 02/24/2016    Electrolytes Lab Results  Component Value Date   NA 137 03/20/2016   K 4.0 03/20/2016   CL 100 (L) 03/20/2016   CALCIUM 8.8 (L) 03/20/2016   MG 2.0 10/26/2015    Pain Modulating Vitamins Lab Results  Component Value Date   VD25OH 26.5 (L) 10/26/2015   VD125OH2TOT 30.8 10/26/2015    Coagulation Parameters Lab Results  Component Value Date   INR 0.97 02/09/2015   LABPROT 13.1 02/09/2015   APTT >  160.0 (HH) 04/21/2012   PLT 190 03/20/2016    Cardiovascular Lab Results  Component Value Date   BNP 255 (H) 08/01/2014   HGB 12.2 (L) 03/20/2016   HCT 34.2 (L) 03/20/2016   Note: Lab results reviewed.  Recent Diagnostic Imaging  No results found.  Meds  The patient has a current medication list which includes the following prescription(s): allopurinol, amlodipine, aspirin, atorvastatin, fifty50 glucose meter 2.0, cholecalciferol, gabapentin, hydrochlorothiazide, hydrocodone-acetaminophen, hydrocodone-acetaminophen, hydrocodone-acetaminophen, isosorbide mononitrate, [facility] lancing device, losartan, metoprolol, multivitamin with minerals, nitroglycerin, potassium, ranitidine, and senna.  Current Outpatient Prescriptions on File Prior to Visit  Medication Sig  . allopurinol (ZYLOPRIM) 300 MG tablet Take 300 mg by mouth every evening.   Marland Kitchen amLODipine (NORVASC) 10 MG tablet Take 10 mg by mouth every evening.   Marland Kitchen atorvastatin (LIPITOR) 40 MG tablet Take 40 mg by mouth at bedtime.   . cholecalciferol (VITAMIN D) 1000 units tablet Take 2,000 Units by mouth daily.  . hydrochlorothiazide (HYDRODIURIL) 25 MG tablet Take 25 mg by mouth daily.  . isosorbide mononitrate (IMDUR) 30 MG 24 hr tablet Take 30 mg by mouth daily.  . metoprolol (LOPRESSOR) 50 MG tablet Take 50 mg by mouth 3 (three) times daily.  . Multiple Vitamin (MULTIVITAMIN WITH MINERALS) TABS tablet Take 1 tablet  by mouth every evening.  . Potassium 95 MG TABS Take 95 mg by mouth daily.   No current facility-administered medications on file prior to visit.     ROS  Constitutional: Denies any fever or chills Gastrointestinal: No reported hemesis, hematochezia, vomiting, or acute GI distress Musculoskeletal: Denies any acute onset joint swelling, redness, loss of ROM, or weakness Neurological: No reported episodes of acute onset apraxia, aphasia, dysarthria, agnosia, amnesia, paralysis, loss of coordination, or loss of  consciousness  Allergies  Mr. Enge has No Known Allergies.  [place]  Medical:  Mr. Steinhagen  has a past medical history of Benign esophageal stricture; Cardiomyopathy, secondary ([place]); Coronary artery disease; Diverticulitis; Gout; Hemorrhoids; Hyperlipidemia; Hypertension; Sleep apnea; and Tubular adenoma of colon. Family: family history includes Heart disease in his mother; Hypertension in his father. Surgical:  has a past surgical history that includes Coronary angioplasty with stent; cardiac bypass; Cardiac surgery; Coronary artery bypass graft; Colonoscopy with propofol (N/A, 04/29/2015); Esophagogastroduodenoscopy (egd) with propofol (N/A, 04/29/2015); Septoplasty; and Cardiac catheterization (Left, 02/03/2016). Tobacco:  reports that he has quit smoking. He quit after 25.00 years of use. He does not have any smokeless tobacco history on file. Alcohol:  reports that he drinks about 8.4 oz of alcohol per week . Drug:  reports that he does not use drugs.  Constitutional Exam  General appearance: Well nourished, well developed, and well hydrated. In no acute distress Vitals:   05/17/16 0931  BP: 133/72  Pulse: 75  Resp: 15  Temp: 98 F (36.7 C)  TempSrc: Oral  SpO2: 98%  Weight: 154 lb (69.9 kg)  Height: 5\' 3"  (1.6 m)  BMI Assessment: Estimated body mass index is 27.28 kg/m as calculated from the following:   Height as of this encounter: 5\' 3"  (1.6 m).   Weight as of this encounter: 154 lb (69.9 kg).   BMI interpretation: (25-29.9 kg/m2) = Overweight: This range is associated with a 20% higher incidence of chronic pain. BMI Readings from Last 4 Encounters:  05/17/16 27.28 kg/m  04/23/16 24.89 kg/m  03/20/16 30.11 kg/m  02/28/16 29.23 kg/m   Wt Readings from Last 4 Encounters:  05/17/16 154 lb (69.9 kg)  04/23/16 140 lb 8 oz (63.7 kg)  03/20/16 170 lb (77.1 kg)  02/28/16 165 lb (74.8 kg)  Psych/Mental status: Alert and oriented x 3 (person, place, & time) Eyes:  PERLA Respiratory: No evidence of acute respiratory distress  Cervical Spine Exam  Inspection: No masses, redness, or swelling Alignment: Symmetrical Functional ROM: ROM appears unrestricted Stability: No instability detected Muscle strength & Tone: Functionally intact Sensory: Unimpaired Palpation: Non-contributory  Upper Extremity (UE) Exam    Side: Right upper extremity  Side: Left upper extremity  Inspection: No masses, redness, swelling, or asymmetry  Inspection: No masses, redness, swelling, or asymmetry  Functional ROM: ROM appears unrestricted          Functional ROM: ROM appears unrestricted          Muscle strength & Tone: Functionally intact  Muscle strength & Tone: Functionally intact  Sensory: Unimpaired  Sensory: Unimpaired  Palpation: Non-contributory  Palpation: Non-contributory   Thoracic Spine Exam  Inspection: No masses, redness, or swelling Alignment: Symmetrical Functional ROM: ROM appears unrestricted Stability: No instability detected Sensory: Unimpaired Muscle strength & Tone: Functionally intact Palpation: Non-contributory  Lumbar Spine Exam  Inspection: No masses, redness, or swelling Alignment: Symmetrical Functional ROM: Improved after treatment Stability: No instability detected Muscle strength & Tone: Functionally intact Sensory: Unimpaired Palpation: Non-contributory  Provocative Tests: Lumbar Hyperextension and rotation test: evaluation deferred today       Patrick's Maneuver: evaluation deferred today              Gait & Posture Assessment  Ambulation: Unassisted Gait: Relatively normal for age and body habitus Posture: WNL   Lower Extremity Exam    Side: Right lower extremity  Side: Left lower extremity  Inspection: No masses, redness, swelling, or asymmetry  Inspection: No masses, redness, swelling, or asymmetry  Functional ROM: ROM appears unrestricted          Functional ROM: ROM appears unrestricted          Muscle strength &  Tone: Functionally intact  Muscle strength & Tone: Functionally intact  Sensory: Unimpaired  Sensory: Unimpaired  Palpation: Non-contributory  Palpation: Non-contributory   Assessment & Plan  Primary Diagnosis & Pertinent Problem List: The primary encounter diagnosis was Chronic pain. Diagnoses of Opiate use, Long term current use of opiate analgesic, Chronic chest wall pain (Location of Primary Source of Pain) (Incisional Midline) (since 04/22/2012), Chronic pain of lower extremity, unspecified laterality, Chronic shoulder pain (Location of Tertiary source of pain) (Right), Opioid-induced constipation (OIC), and Neurogenic pain were also pertinent to this visit.  Visit Diagnosis: 1. Chronic pain   2. Opiate use   3. Long term current use of opiate analgesic   4. Chronic chest wall pain (Location of Primary Source of Pain) (Incisional Midline) (since 04/22/2012)   5. Chronic pain of lower extremity, unspecified laterality   6. Chronic shoulder pain (Location of Tertiary source of pain) (Right)   7. Opioid-induced constipation (OIC)   8. Neurogenic pain     Problems updated and reviewed during this visit: Problem  Opioid-induced constipation (OIC)  Hypokalemia  Brachial Plexus Injury, Right  Carotid Atherosclerosis  Hypoalbuminemia  Right Sided Weakness  S/P Cabg (Coronary Artery Bypass Graft)   Overview:  Redo CABGx1 (free RIMA - LAD)    Problem-specific Plan(s): No problem-specific Assessment & Plan notes found for this encounter.  No new Assessment & Plan notes have been filed under this hospital service since the last note was generated. Service: Pain Management  Plan of Care   Problem List Items Addressed This Visit      High   Chronic chest wall pain (Location of Primary Source of Pain) (Incisional Midline) (since 04/22/2012) (Chronic)   Chronic lower extremity pain (Location of Secondary source of pain) (Bilateral) (R>L) (Chronic)   Chronic pain - Primary (Chronic)    Relevant Medications   aspirin 81 MG chewable tablet   gabapentin (NEURONTIN) 600 MG tablet   HYDROcodone-acetaminophen (NORCO/VICODIN) 5-325 MG tablet (Start on 05/21/2016)   HYDROcodone-acetaminophen (NORCO/VICODIN) 5-325 MG tablet (Start on 06/20/2016)   HYDROcodone-acetaminophen (NORCO/VICODIN) 5-325 MG tablet (Start on 07/20/2016)   Chronic shoulder pain (Location of Tertiary source of pain) (Right) (Chronic)   Neurogenic pain (Chronic)   Relevant Medications   gabapentin (NEURONTIN) 600 MG tablet     Medium   Long term current use of opiate analgesic (Chronic)   Opiate use (Chronic)   Opioid-induced constipation (OIC) (Chronic)   Relevant Medications   senna (SENOKOT) 8.6 MG tablet    Other Visit Diagnoses   None.    Pharmacotherapy (Medications Ordered): Meds ordered this encounter  Medications  . HYDROcodone-acetaminophen (NORCO/VICODIN) 5-325 MG tablet    Sig: Take 1 tablet by mouth 2 (two) times daily as needed for severe pain.    Dispense:  60 tablet    Refill:  0    Do not place this medication, or any other prescription from our practice, on "Automatic Refill". Patient may have prescription filled one day early if pharmacy is closed on scheduled refill date. Do not fill until: 05/21/16 To last until: 06/20/16  . HYDROcodone-acetaminophen (NORCO/VICODIN) 5-325 MG tablet    Sig: Take 1 tablet by mouth 2 (two) times daily as needed for severe pain.    Dispense:  60 tablet    Refill:  0    Do not place this medication, or any other prescription from our practice, on "Automatic Refill". Patient may have prescription filled one day early if pharmacy is closed on scheduled refill date. Do not fill until: 06/20/16 To last until: 07/20/16  . HYDROcodone-acetaminophen (NORCO/VICODIN) 5-325 MG tablet    Sig: Take 1 tablet by mouth 2 (two) times daily as needed for severe pain.    Dispense:  60 tablet    Refill:  0    Do not place this medication, or any other  prescription from our practice, on "Automatic Refill". Patient may have prescription filled one day early if pharmacy is closed on scheduled refill date. Do not fill until: 07/20/16 To last until: 08/19/16   [facility] & Procedure Ordered: No orders of the defined types were placed in this encounter.  Imaging Ordered: None  Interventional Therapies: Scheduled:  None at this time.    Considering:  Lumbar facet and sacroiliac joint radiofrequency ablation.    PRN Procedures:  Diagnostic left intra-articular hip joint injection under fluoroscopic guidance, with or without sedation.    Referral(s) or Consult(s): None at this time.  New Prescriptions   No medications on file    Medications administered during this visit: Mr. Gonzalezlopez had no medications administered during this visit.  Requested PM Follow-up: Return in 3 months (on 08/13/2016) for Med-Mgmt.  Future Appointments Date Time Provider [place]  08/13/2016 1:20 PM Milinda Pointer, MD [facility] None    Primary Care Physician: Elisabeth Cara, NP Location: [facility] Outpatient Pain Management Facility Note by: Kathlen Brunswick. Dossie Arbour, M.D, DABA, DABAPM, DABPM, DABIPP, FIPP  Pain Score Disclaimer: We use the NRS-11 scale. This is a self-reported, subjective measurement of pain severity with only modest accuracy. It is used primarily to identify changes within a particular patient. It must be understood that outpatient pain scales are significantly less accurate that those used for research, where they can be applied under ideal controlled circumstances with minimal exposure to variables. In reality, the score is likely to be a combination of pain intensity and pain affect, where pain affect describes the degree of emotional arousal or changes in action readiness caused by the sensory experience of pain. Factors such as social and work situation, setting, emotional state, anxiety levels, expectation, and prior pain  experience may influence pain perception and show large inter-individual differences that may also be affected by time variables.  Patient instructions provided during this appointment: There are no Patient Instructions on file for this visit.

## 2016-05-17 NOTE — Progress Notes (Signed)
Safety precautions to be maintained throughout the outpatient stay will include: orient to surroundings, keep bed in low position, maintain call bell within reach at all times, provide assistance with transfer out of bed and ambulation. Was in hospital for heart bypass. Was given narcotics in hospital  Hydrocodone count 22/60 filled on 04/27/16

## 2016-05-17 NOTE — Progress Notes (Signed)
Safety precautions to be maintained throughout the outpatient stay will include: orient to surroundings, keep bed in low position, maintain call bell within reach at all times, provide assistance with transfer out of bed and ambulation. Was in hospital had heart bypass . Was given narcotics in hospital pill count 22/60 of hyudrocodone 5/325mg   Filled on 04/27/16

## 2016-05-24 ENCOUNTER — Encounter: Payer: Medicare Other | Attending: Surgery | Admitting: Surgery

## 2016-05-24 DIAGNOSIS — I2510 Atherosclerotic heart disease of native coronary artery without angina pectoris: Secondary | ICD-10-CM | POA: Diagnosis not present

## 2016-05-24 DIAGNOSIS — I428 Other cardiomyopathies: Secondary | ICD-10-CM | POA: Diagnosis not present

## 2016-05-24 DIAGNOSIS — I509 Heart failure, unspecified: Secondary | ICD-10-CM | POA: Diagnosis not present

## 2016-05-24 DIAGNOSIS — L89111 Pressure ulcer of right upper back, stage 1: Secondary | ICD-10-CM | POA: Insufficient documentation

## 2016-05-24 DIAGNOSIS — Z79899 Other long term (current) drug therapy: Secondary | ICD-10-CM | POA: Insufficient documentation

## 2016-05-24 DIAGNOSIS — I252 Old myocardial infarction: Secondary | ICD-10-CM | POA: Insufficient documentation

## 2016-05-24 DIAGNOSIS — Z7982 Long term (current) use of aspirin: Secondary | ICD-10-CM | POA: Diagnosis not present

## 2016-05-24 DIAGNOSIS — M109 Gout, unspecified: Secondary | ICD-10-CM | POA: Insufficient documentation

## 2016-05-24 DIAGNOSIS — G4730 Sleep apnea, unspecified: Secondary | ICD-10-CM | POA: Insufficient documentation

## 2016-05-24 DIAGNOSIS — L89121 Pressure ulcer of left upper back, stage 1: Secondary | ICD-10-CM | POA: Diagnosis not present

## 2016-05-24 DIAGNOSIS — Z87891 Personal history of nicotine dependence: Secondary | ICD-10-CM | POA: Diagnosis not present

## 2016-05-24 DIAGNOSIS — I110 Hypertensive heart disease with heart failure: Secondary | ICD-10-CM | POA: Insufficient documentation

## 2016-05-25 NOTE — Progress Notes (Signed)
[name], [name] ([id number]) Visit Report for 05/24/2016 Abuse/Suicide Risk Screen Details Patient Name: [name] [name], [name] [name] Date of Service: 05/24/2016 8:00 AM Medical Record Number: [id number] Patient Account Number: [id number] Date of Birth/Sex: 05/08/1956 (60 y.o. Male) Treating RN: Cornell Barman Primary Care Physician: Elisabeth Cara Other Clinician: Referring Physician: SELF, REFERRED Treating Physician/Extender: Frann Rider in Treatment: 0 Abuse/Suicide Risk Screen Items Answer ABUSE/SUICIDE RISK SCREEN: Has anyone close to you tried to hurt or harm you recentlyo No Do you feel uncomfortable with anyone in your familyo No Has anyone forced you do things that you didnot want to doo No Do you have any thoughts of harming yourselfo No Patient displays signs or symptoms of abuse and/or neglect. No Electronic Signature(s) Signed: 05/25/2016 10:50:50 AM By: Gretta Cool, RN, BSN, Kim RN, BSN Entered By: Gretta Cool, RN, BSN, Kim on 05/24/2016 08:33:12 [name] [name] ([id number]) -------------------------------------------------------------------------------- Activities of Daily Living Details Patient Name: [name] [name] Date of Service: 05/24/2016 8:00 AM Medical Record Number: [id number] Patient Account Number: [id number] Date of Birth/Sex: July 14, 1956 (60 y.o. Male) Treating RN: Cornell Barman Primary Care Physician: Elisabeth Cara Other Clinician: Referring Physician: SELF, REFERRED Treating Physician/Extender: Frann Rider in Treatment: 0 Activities of Daily Living Items Answer Activities of Daily Living (Please select one for each item) Drive Automobile Completely Able Take Medications Completely Able Use Telephone Completely Able Care for Appearance Completely Able Use Toilet Completely Able Bath / Shower Completely Able Dress Self Completely Able Feed Self Completely Able Walk Completely Able Get In / Out Bed Completely Able Housework Completely Able Prepare Meals  Completely [place] for Self Completely Able Electronic Signature(s) Signed: 05/25/2016 10:50:50 AM By: Gretta Cool, RN, BSN, Kim RN, BSN Entered By: Gretta Cool, RN, BSN, Kim on 05/24/2016 08:33:22 [name] [name] ([id number]) -------------------------------------------------------------------------------- Education Assessment Details Patient Name: [name] [name] Date of Service: 05/24/2016 8:00 AM Medical Record Number: [id number] Patient Account Number: [id number] Date of Birth/Sex: 11-09-1955 (60 y.o. Male) Treating RN: Cornell Barman Primary Care Physician: Elisabeth Cara Other Clinician: Referring Physician: SELF, REFERRED Treating Physician/Extender: Frann Rider in Treatment: 0 Primary Learner Assessed: Patient Learning Preferences/Education Level/Primary Language Learning Preference: Explanation, Demonstration Highest Education Level: College or Above Preferred Language: English Cognitive Barrier Assessment/Beliefs Language Barrier: No Translator Needed: No Memory Deficit: No Emotional Barrier: No Cultural/Religious Beliefs Affecting Medical No Care: Physical Barrier Assessment Impaired Vision: No Impaired Hearing: No Decreased Hand dexterity: No Knowledge/Comprehension Assessment Knowledge Level: High Comprehension Level: High Ability to understand written High instructions: Ability to understand verbal High instructions: Motivation Assessment Anxiety Level: Calm Cooperation: Cooperative Education Importance: Acknowledges Need Interest in Health Problems: Asks Questions Perception: Coherent Willingness to Engage in Self- High Management Activities: Readiness to Engage in Self- High Management Activities: Electronic Signature(s) VIBHU, REYMUNDO ([id number]) Signed: 05/25/2016 10:50:50 AM By: Gretta Cool, RN, BSN, Kim RN, BSN Entered By: Gretta Cool, RN, BSN, Kim on 05/24/2016 08:33:50 [name] [name]  ([id number]) -------------------------------------------------------------------------------- Fall Risk Assessment Details Patient Name: [name] [name] Date of Service: 05/24/2016 8:00 AM Medical Record Number: [id number] Patient Account Number: [id number] Date of Birth/Sex: [birth year] (60 y.o. Male) Treating RN: Cornell Barman Primary Care Physician: Elisabeth Cara Other Clinician: Referring Physician: SELF, REFERRED Treating Physician/Extender: Frann Rider in Treatment: 0 Fall Risk Assessment Items Have you had 2 or more falls in the last 12 monthso 0 Yes Have you had any fall that resulted in injury in the last 12 monthso 0 Yes FALL RISK ASSESSMENT: History of falling - immediate or within  3 months 0 No Secondary diagnosis 0 No Ambulatory aid None/bed rest/wheelchair/nurse 0 Yes Crutches/cane/walker 0 No Furniture 0 No IV Access/Saline Lock 0 No Gait/Training Normal/bed rest/immobile 0 Yes Weak 0 No Impaired 0 No Mental Status Oriented to own ability 0 Yes Electronic Signature(s) Signed: 05/25/2016 10:50:50 AM By: Gretta Cool, RN, BSN, Kim RN, BSN Entered By: Gretta Cool, RN, BSN, Kim on 05/24/2016 08:34:01 [name] [name] ([id number]) -------------------------------------------------------------------------------- Nutrition Risk Assessment Details Patient Name: [name] [name] Date of Service: 05/24/2016 8:00 AM Medical Record Number: [id number] Patient Account Number: [id number] Date of Birth/Sex: 1956-03-11 (60 y.o. Male) Treating RN: Cornell Barman Primary Care Physician: Elisabeth Cara Other Clinician: Referring Physician: SELF, REFERRED Treating Physician/Extender: Frann Rider in Treatment: 0 Height (in): 63 Weight (lbs): 151 Body Mass Index (BMI): 26.7 Nutrition Risk Assessment Items NUTRITION RISK SCREEN: I have an illness or condition that made me change the kind and/or 0 No amount of food I eat I eat fewer than two meals per day 0 No I eat few fruits and  vegetables, or milk products 0 No I have three or more drinks of beer, liquor or wine almost every day 0 No I have tooth or mouth problems that make it hard for me to eat 0 No I don't always have enough money to buy the food I need 0 No I eat alone most of the time 0 No I take three or more different prescribed or over-the-counter drugs a 1 Yes day Without wanting to, I have lost or gained 10 pounds in the last six 0 No months I am not always physically able to shop, cook and/or feed myself 0 No Nutrition Protocols Good Risk Protocol 0 No interventions needed Moderate Risk Protocol Electronic Signature(s) Signed: 05/25/2016 10:50:50 AM By: Gretta Cool, RN, BSN, Kim RN, BSN Entered By: Gretta Cool, RN, BSN, Kim on 05/24/2016 08:34:09

## 2016-05-25 NOTE — Progress Notes (Signed)
OSIRIS, MAROUN ([id number]) Visit Report for 05/24/2016 Allergy List Details Patient Name: [name] [name], [name] [name] Date of Service: 05/24/2016 8:00 AM Medical Record Number: [id number] Patient Account Number: [id number] Date of Birth/Sex: 05-16-1956 (60 y.o. Male) Treating RN: Cornell Barman Primary Care Physician: Elisabeth Cara Other Clinician: Referring Physician: SELF, REFERRED Treating Physician/Extender: Frann Rider in Treatment: 0 Allergies Active Allergies No Known Drug Allergies Allergy Notes Electronic Signature(s) Signed: 05/25/2016 10:50:50 AM By: Gretta Cool, RN, BSN, Kim RN, BSN Entered By: Gretta Cool, RN, BSN, Kim on 05/24/2016 08:22:44 [name] [name] ([id number]) -------------------------------------------------------------------------------- Arrival Information Details Patient Name: [name] [name] Date of Service: 05/24/2016 8:00 AM Medical Record Number: [id number] Patient Account Number: [id number] Date of Birth/Sex: 20-Jun-1956 (60 y.o. Male) Treating RN: Cornell Barman Primary Care Physician: Elisabeth Cara Other Clinician: Referring Physician: SELF, REFERRED Treating Physician/Extender: Frann Rider in Treatment: 0 Visit Information Patient Arrived: Gilford Rile Arrival Time: 08:17 Accompanied By: self Transfer Assistance: None Patient Identification Verified: Yes Secondary Verification Process Yes Completed: Patient Has Alerts: Yes Patient Alerts: Patient on Blood Thinner Aspirin 81mg  Pre-Diabetic Electronic Signature(s) Signed: 05/25/2016 10:50:50 AM By: Gretta Cool, RN, BSN, Kim RN, BSN Entered By: Gretta Cool, RN, BSN, Kim on 05/24/2016 08:20:01 [name] [name] ([id number]) -------------------------------------------------------------------------------- Clinic Level of Care Assessment Details Patient Name: [name] [name] Date of Service: 05/24/2016 8:00 AM Medical Record Number: [id number] Patient Account Number: [id number] Date of Birth/Sex: 1956/08/13 (60 y.o.  Male) Treating RN: Cornell Barman Primary Care Physician: Elisabeth Cara Other Clinician: Referring Physician: SELF, REFERRED Treating Physician/Extender: Frann Rider in Treatment: 0 Clinic Level of Care Assessment Items TOOL 2 Quantity Score []  - Use when only an EandM is performed on the INITIAL visit 0 ASSESSMENTS - Nursing Assessment / Reassessment X - General Physical Exam (combine w/ comprehensive assessment (listed just 1 20 below) when performed on new pt. evals) X - Comprehensive Assessment (HX, ROS, Risk Assessments, Wounds Hx, etc.) 1 25 ASSESSMENTS - Wound and Skin Assessment / Reassessment X - Simple Wound Assessment / Reassessment - one wound 1 5 []  - Complex Wound Assessment / Reassessment - multiple wounds 0 []  - Dermatologic / Skin Assessment (not related to wound area) 0 ASSESSMENTS - Ostomy and/or Continence Assessment and Care []  - Incontinence Assessment and Management 0 []  - Ostomy Care Assessment and Management (repouching, etc.) 0 PROCESS - Coordination of Care X - Simple Patient / Family Education for ongoing care 1 15 []  - Complex (extensive) Patient / Family Education for ongoing care 0 X - Staff obtains Programmer, systems, Records, Test Results / Process Orders 1 10 []  - Staff telephones HHA, Nursing Homes / Clarify orders / etc 0 []  - Routine Transfer to another Facility (non-emergent condition) 0 []  - Routine Hospital Admission (non-emergent condition) 0 []  - New Admissions / Biomedical engineer / Ordering NPWT, Apligraf, etc. 0 []  - Emergency Hospital Admission (emergent condition) 0 X - Simple Discharge Coordination 1 10 ZAHI, CONTEH. ([id number]) []  - Complex (extensive) Discharge Coordination 0 PROCESS - Special Needs []  - Pediatric / Minor Patient Management 0 []  - Isolation Patient Management 0 []  - Hearing / Language / Visual special needs 0 []  - Assessment of Community assistance (transportation, D/C planning, etc.) 0 []  - Additional  assistance / Altered mentation 0 []  - Support Surface(s) Assessment (bed, cushion, seat, etc.) 0 INTERVENTIONS - Wound Cleansing / Measurement X - Wound Imaging (photographs - any number of wounds) 1 5 []  - Wound Tracing (instead of photographs) 0 X - Simple Wound Measurement -  one wound 1 5 []  - Complex Wound Measurement - multiple wounds 0 X - Simple Wound Cleansing - one wound 1 5 []  - Complex Wound Cleansing - multiple wounds 0 INTERVENTIONS - Wound Dressings X - Small Wound Dressing one or multiple wounds 1 10 []  - Medium Wound Dressing one or multiple wounds 0 []  - Large Wound Dressing one or multiple wounds 0 []  - Application of Medications - injection 0 INTERVENTIONS - Miscellaneous []  - External ear exam 0 []  - Specimen Collection (cultures, biopsies, blood, body fluids, etc.) 0 []  - Specimen(s) / Culture(s) sent or taken to Lab for analysis 0 []  - Patient Transfer (multiple staff / Harrel Lemon Lift / Similar devices) 0 []  - Simple Staple / Suture removal (25 or less) 0 []  - Complex Staple / Suture removal (26 or more) 0 [place], Nylan T. ([id number]) []  - Hypo / Hyperglycemic Management (close monitor of Blood Glucose) 0 []  - Ankle / Brachial Index (ABI) - do not check if billed separately 0 Has the patient been seen at the hospital within the last three years: Yes Total Score: 110 Level Of Care: New/Established - Level 3 Electronic Signature(s) Signed: 05/25/2016 10:50:50 AM By: Gretta Cool, RN, BSN, Kim RN, BSN Entered By: Gretta Cool, RN, BSN, Kim on 05/24/2016 08:52:54 [name] [name] ([id number]) -------------------------------------------------------------------------------- Encounter Discharge Information Details Patient Name: [name] [name] Date of Service: 05/24/2016 8:00 AM Medical Record Number: [id number] Patient Account Number: [id number] Date of Birth/Sex: 1955/11/29 (60 y.o. Male) Treating RN: Cornell Barman Primary Care Physician: Elisabeth Cara Other Clinician: Referring  Physician: SELF, REFERRED Treating Physician/Extender: Frann Rider in Treatment: 0 Encounter Discharge Information Items Discharge Pain Level: 0 Discharge Condition: Stable Ambulatory Status: Ambulatory Discharge Destination: Home Transportation: Private Auto Accompanied By: self Schedule Follow-up Appointment: No Medication Reconciliation completed and provided to Patient/Care Yes Joci Dress: Provided on Clinical Summary of Care: 05/24/2016 Form Type Recipient Paper Patient JL Electronic Signature(s) Signed: 05/24/2016 8:55:59 AM By: Ruthine Dose Entered By: Ruthine Dose on 05/24/2016 08:55:59 [name] [name] ([id number]) -------------------------------------------------------------------------------- Multi Wound Chart Details Patient Name: [name] [name] Date of Service: 05/24/2016 8:00 AM Medical Record Number: [id number] Patient Account Number: [id number] Date of Birth/Sex: 1955-10-12 (60 y.o. Male) Treating RN: Cornell Barman Primary Care Physician: Elisabeth Cara Other Clinician: Referring Physician: SELF, REFERRED Treating Physician/Extender: Frann Rider in Treatment: 0 Vital Signs Height(in): 63 Pulse(bpm): 78 Weight(lbs): 151 Blood Pressure 122/70 (mmHg): Body Mass Index(BMI): 27 Temperature(F): 97.5 Respiratory Rate 18 (breaths/min): Photos: [N/A:N/A] Wound Location: Back - Medial N/A N/A Wounding Event: Pressure Injury N/A N/A Primary Etiology: Pressure Ulcer N/A N/A Comorbid History: Sleep Apnea, Angina, N/A N/A Congestive Heart Failure, Coronary Artery Disease, Hypertension, Myocardial Infarction, Gout Date Acquired: 04/04/2016 N/A N/A Weeks of Treatment: 0 N/A N/A Wound Status: Open N/A N/A Measurements L x W x D 1x1.2x0.1 N/A N/A (cm) Area (cm) : 0.942 N/A N/A Volume (cm) : 0.094 N/A N/A % Reduction in Area: 0.00% N/A N/A % Reduction in Volume: 0.00% N/A N/A Classification: Category/Stage I N/A N/A Exudate Amount: Small N/A  N/A Exudate Type: Serous N/A N/A Exudate Color: amber N/A N/A Wound Margin: Indistinct, nonvisible N/A N/A Granulation Amount: Large (67-100%) N/A N/A Necrotic Amount: None Present (0%) N/A N/A RIVAN, PETTEYS T. ([id number]) Exposed Structures: Fascia: No N/A N/A Fat: No Tendon: No Muscle: No Joint: No Bone: No Limited to Skin Breakdown Epithelialization: Large (67-100%) N/A N/A Periwound Skin Texture: Scarring: Yes N/A N/A Edema: No Excoriation: No Induration: No Callus: No Crepitus: No  Fluctuance: No Friable: No Rash: No Periwound Skin Maceration: No N/A N/A Moisture: Moist: No Dry/Scaly: No Periwound Skin Color: Atrophie Blanche: No N/A N/A Cyanosis: No Ecchymosis: No Erythema: No Hemosiderin Staining: No Mottled: No Pallor: No Rubor: No Tenderness on No N/A N/A Palpation: Wound Preparation: Ulcer Cleansing: Not N/A N/A Cleansed Topical Anesthetic Applied: None Treatment Notes Electronic Signature(s) Signed: 05/25/2016 10:50:50 AM By: Gretta Cool, RN, BSN, Kim RN, BSN Entered By: Gretta Cool, RN, BSN, Kim on 05/24/2016 08:49:01 [name] [name] ([id number]) -------------------------------------------------------------------------------- Multi-Disciplinary Care Plan Details Patient Name: [name] [name] Date of Service: 05/24/2016 8:00 AM Medical Record Number: [id number] Patient Account Number: [id number] Date of Birth/Sex: [birth year] (60 y.o. Male) Treating RN: Cornell Barman Primary Care Physician: Elisabeth Cara Other Clinician: Referring Physician: SELF, REFERRED Treating Physician/Extender: Frann Rider in Treatment: 0 Active Inactive Electronic Signature(s) Signed: 05/25/2016 10:50:50 AM By: Gretta Cool, RN, BSN, Kim RN, BSN Entered By: Gretta Cool, RN, BSN, Kim on 05/24/2016 08:48:37 [name] [name] ([id number]) -------------------------------------------------------------------------------- Pain Assessment Details Patient Name: [name] [name] Date of Service:  05/24/2016 8:00 AM Medical Record Number: [id number] Patient Account Number: [id number] Date of Birth/Sex: 1956/08/09 (60 y.o. Male) Treating RN: Cornell Barman Primary Care Physician: Elisabeth Cara Other Clinician: Referring Physician: SELF, REFERRED Treating Physician/Extender: Frann Rider in Treatment: 0 Active Problems Location of Pain Severity and Description of Pain Patient Has Paino No Site Locations With Dressing Change: No Pain Management and Medication Current Pain Management: Electronic Signature(s) Signed: 05/25/2016 10:50:50 AM By: Gretta Cool, RN, BSN, Kim RN, BSN Entered By: Gretta Cool, RN, BSN, Kim on 05/24/2016 08:20:23 [name] [name] ([id number]) -------------------------------------------------------------------------------- Patient/Caregiver Education Details Patient Name: [name] [name] Date of Service: 05/24/2016 8:00 AM Medical Record Number: [id number] Patient Account Number: [id number] Date of Birth/Gender: Jan 19, 1956 (60 y.o. Male) Treating RN: Cornell Barman Primary Care Physician: Elisabeth Cara Other Clinician: Referring Physician: SELF, REFERRED Treating Physician/Extender: Frann Rider in Treatment: 0 Education Assessment Education Provided To: Patient Education Topics Provided Engineer, maintenance) Signed: 05/25/2016 10:50:50 AM By: Gretta Cool, RN, BSN, Kim RN, BSN Entered By: Gretta Cool, RN, BSN, Kim on 05/24/2016 08:54:03 [name] [name] ([id number]) -------------------------------------------------------------------------------- Wound Assessment Details Patient Name: [name] [name] Date of Service: 05/24/2016 8:00 AM Medical Record Number: [id number] Patient Account Number: [id number] Date of Birth/Sex: 1956-03-05 (60 y.o. Male) Treating RN: Cornell Barman Primary Care Physician: Elisabeth Cara Other Clinician: Referring Physician: SELF, REFERRED Treating Physician/Extender: Frann Rider in Treatment: 0 Wound Status Wound Number: 1 Primary  Pressure Ulcer Etiology: Wound Location: Medial Back Wound Healed - Epithelialized Wounding Event: Pressure Injury Status: Date Acquired: 04/04/2016 Comorbid Sleep Apnea, Angina, Congestive Heart Weeks Of Treatment: 0 History: Failure, Coronary Artery Disease, Clustered Wound: No Hypertension, Myocardial Infarction, Gout Photos Wound Measurements Length: (cm) 0 % Reduction i Width: (cm) 0 % Reduction i Depth: (cm) 0 Epithelializa Area: (cm) 0 Tunneling: Volume: (cm) 0 Undermining: n Area: 100% n Volume: 100% tion: Large (67-100%) No No Wound Description Classification: Category/Stage I Wound Margin: Indistinct, nonvisible Exudate Amount: Small Exudate Type: Serous Exudate Color: amber Wound Bed Granulation Amount: Large (67-100%) Exposed Structure Necrotic Amount: None Present (0%) Fascia Exposed: No Fat Layer Exposed: No Tendon Exposed: No Muscle Exposed: No Joint Exposed: No Paiva, Derris T. ([id number]) Bone Exposed: No Limited to Skin Breakdown Periwound Skin Texture Texture Color No Abnormalities Noted: No No Abnormalities Noted: No Callus: No Atrophie Blanche: No Crepitus: No Cyanosis: No Excoriation: No Ecchymosis: No Fluctuance: No Erythema: No Friable: No Hemosiderin Staining: No Induration: No Mottled: No Localized  Edema: No Pallor: No Rash: No Rubor: No Scarring: Yes Moisture No Abnormalities Noted: No Dry / Scaly: No Maceration: No Moist: No Wound Preparation Ulcer Cleansing: Not Cleansed Topical Anesthetic Applied: None Electronic Signature(s) Signed: 05/25/2016 10:50:50 AM By: Gretta Cool, RN, BSN, Kim RN, BSN Entered By: Gretta Cool, RN, BSN, Kim on 05/24/2016 08:50:17 [name] [name] ([id number]) -------------------------------------------------------------------------------- [place] Details Patient Name: [name] [name] Date of Service: 05/24/2016 8:00 AM Medical Record Number: [id number] Patient Account Number: [id number] Date of  Birth/Sex: 07-05-1956 (60 y.o. Male) Treating RN: Cornell Barman Primary Care Physician: Elisabeth Cara Other Clinician: Referring Physician: SELF, REFERRED Treating Physician/Extender: Frann Rider in Treatment: 0 Vital Signs Time Taken: 08:20 Temperature (F): 97.5 Height (in): 63 Pulse (bpm): 78 Source: Stated Respiratory Rate (breaths/min): 18 Weight (lbs): 151 Blood Pressure (mmHg): 122/70 Source: Measured Reference Range: 80 - 120 mg / dl Body Mass Index (BMI): 26.7 Electronic Signature(s) Signed: 05/25/2016 10:50:50 AM By: Gretta Cool, RN, BSN, Kim RN, BSN Entered By: Gretta Cool, RN, BSN, Kim on 05/24/2016 08:20:51

## 2016-05-25 NOTE — Progress Notes (Signed)
JAMAI, JESPERSEN ([id number]) Visit Report for 05/24/2016 Chief Complaint Document Details Patient Name: [name] [name], [name] [name] Date of Service: 05/24/2016 8:00 AM Medical Record Number: [id number] Patient Account Number: [id number] Date of Birth/Sex: 1956-04-22 (61 y.o. Male) Treating RN: Cornell Barman Primary Care Physician: Elisabeth Cara Other Clinician: Referring Physician: SELF, REFERRED Treating Physician/Extender: Frann Rider in Treatment: 0 Information Obtained from: Patient Chief Complaint Patient is at the clinic for treatment of an open pressure ulcer in the upper mid back area which he had about 8 weeks ago and now has almost completely healed Electronic Signature(s) Signed: 05/24/2016 8:52:40 AM By: Christin Fudge MD, FACS Entered By: Christin Fudge on 05/24/2016 08:52:39 [name] [name] ([id number]) -------------------------------------------------------------------------------- HPI Details Patient Name: [name] [name] Date of Service: 05/24/2016 8:00 AM Medical Record Number: [id number] Patient Account Number: [id number] Date of Birth/Sex: 04-08-1956 (60 y.o. Male) Treating RN: Cornell Barman Primary Care Physician: Elisabeth Cara Other Clinician: Referring Physician: SELF, REFERRED Treating Physician/Extender: Frann Rider in Treatment: 0 History of Present Illness Location: upper mid back Quality: Patient reports No Pain. Severity: Patient states wound (s) are getting better. Duration: Patient has had the wound for < 12 weeks prior to presenting for treatment Context: The wound would happen gradually Modifying Factors: Other treatment(s) tried include:care with a padded foam bandage HPI Description: 60 year old gentleman who recently had a revision of CABG and was in rehabilitation is known to have past medical history of esophageal stricture, cardiomyopathy, coronary artery disease, gout, hypertension and is status post cardiac bypass, coronary angioplasty, EGD.  Former smoker and quit about 25 years ago. Healso sees pain management for chronic pain, opiate use, chronic chest wall pain, chronic pain of the lower extremity, chronic shoulder pain and opioid-induced constipation. not a diabetic but hemoglobin A1c is checked regularly and in September of this year it was 5.9% Engineer, maintenance) Signed: 05/24/2016 8:54:29 AM By: Christin Fudge MD, FACS Previous Signature: 05/24/2016 8:33:48 AM Version By: Christin Fudge MD, FACS Entered By: Christin Fudge on 05/24/2016 08:54:29 [name] [name] ([id number]) -------------------------------------------------------------------------------- Physical Exam Details Patient Name: [name] [name] Date of Service: 05/24/2016 8:00 AM Medical Record Number: [id number] Patient Account Number: [id number] Date of Birth/Sex: 03-12-1956 (60 y.o. Male) Treating RN: Cornell Barman Primary Care Physician: Elisabeth Cara Other Clinician: Referring Physician: SELF, REFERRED Treating Physician/Extender: Frann Rider in Treatment: 0 Constitutional . Pulse regular. Respirations normal and unlabored. Afebrile. . Eyes Nonicteric. Reactive to light. Ears, Nose, Mouth, and Throat Lips, teeth, and gums WNL.Marland Kitchen Moist mucosa without lesions. Neck supple and nontender. No palpable supraclavicular or cervical adenopathy. Normal sized without goiter. Respiratory WNL. No retractions.. Cardiovascular Pedal Pulses WNL. No clubbing, cyanosis or edema. Gastrointestinal (GI) Abdomen without masses or tenderness.. No liver or spleen enlargement or tenderness.. Lymphatic No adneopathy. No adenopathy. No adenopathy. Musculoskeletal Adexa without tenderness or enlargement.. Digits and nails w/o clubbing, cyanosis, infection, petechiae, ischemia, or inflammatory conditions.. Integumentary (Hair, Skin) No suspicious lesions. No crepitus or fluctuance. No peri-wound warmth or erythema. No masses.Marland Kitchen Psychiatric Judgement and insight  Intact.. No evidence of depression, anxiety, or agitation.. Notes he has a healed supple scar in the mid back region where he probably had a pressure injury stage I which is now healed Electronic Signature(s) Signed: 05/24/2016 8:55:12 AM By: Christin Fudge MD, FACS Entered By: Christin Fudge on 05/24/2016 08:55:12 [name] [name] ([id number]) -------------------------------------------------------------------------------- Physician Orders Details Patient Name: [name] [name] Date of Service: 05/24/2016 8:00 AM Medical Record Number: [id number] Patient Account Number: [id number] Date of  Birth/Sex: 04-02-1956 (60 y.o. Male) Treating RN: Cornell Barman Primary Care Physician: Elisabeth Cara Other Clinician: Referring Physician: SELF, REFERRED Treating Physician/Extender: Frann Rider in Treatment: 0 Verbal / Phone Orders: Yes Clinician: Cornell Barman Read Back and Verified: Yes Diagnosis Coding Discharge From [facility] Services o Discharge from [facility] - treatment complete Electronic Signature(s) Signed: 05/24/2016 11:43:27 AM By: Christin Fudge MD, FACS Signed: 05/25/2016 10:50:50 AM By: Gretta Cool RN, BSN, Kim RN, BSN Entered By: Gretta Cool, RN, BSN, Kim on 05/24/2016 08:52:21 [name] [name] ([id number]) -------------------------------------------------------------------------------- Problem List Details Patient Name: [name] [name] Date of Service: 05/24/2016 8:00 AM Medical Record Number: [id number] Patient Account Number: [id number] Date of Birth/Sex: 1956/05/27 (60 y.o. Male) Treating RN: Cornell Barman Primary Care Physician: Elisabeth Cara Other Clinician: Referring Physician: SELF, REFERRED Treating Physician/Extender: Frann Rider in Treatment: 0 Active Problems ICD-10 Encounter Code Description Active Date Diagnosis L89.111 Pressure ulcer of right upper back, stage 1 05/24/2016 Yes L89.121 Pressure ulcer of left upper back, stage 1 05/24/2016 Yes Inactive  Problems Resolved Problems Electronic Signature(s) Signed: 05/24/2016 8:51:20 AM By: Christin Fudge MD, FACS Entered By: Christin Fudge on 05/24/2016 08:51:20 [name] [name] ([id number]) -------------------------------------------------------------------------------- Progress Note Details Patient Name: [name] [name] Date of Service: 05/24/2016 8:00 AM Medical Record Number: [id number] Patient Account Number: [id number] Date of Birth/Sex: 05/27/1956 (60 y.o. Male) Treating RN: Cornell Barman Primary Care Physician: Elisabeth Cara Other Clinician: Referring Physician: SELF, REFERRED Treating Physician/Extender: Frann Rider in Treatment: 0 Subjective Chief Complaint Information obtained from Patient Patient is at the clinic for treatment of an open pressure ulcer in the upper mid back area which he had about 8 weeks ago and now has almost completely healed History of Present Illness (HPI) The following HPI elements were documented for the patient's wound: Location: upper mid back Quality: Patient reports No Pain. Severity: Patient states wound (s) are getting better. Duration: Patient has had the wound for < 12 weeks prior to presenting for treatment Context: The wound would happen gradually Modifying Factors: Other treatment(s) tried include:care with a padded foam bandage 60 year old gentleman who recently had a revision of CABG and was in rehabilitation is known to have past medical history of esophageal stricture, cardiomyopathy, coronary artery disease, gout, hypertension and is status post cardiac bypass, coronary angioplasty, EGD. Former smoker and quit about 25 years ago. Healso sees pain management for chronic pain, opiate use, chronic chest wall pain, chronic pain of the lower extremity, chronic shoulder pain and opioid-induced constipation. not a diabetic but hemoglobin A1c is checked regularly and in September of this year it was 5.9% Wound History Patient presents  with 1 open wound that has been present for approximately 6 weeks. Patient has been treating wound in the following manner: covering with bandage. Laboratory tests have been performed in the last month. Patient reportedly has not tested positive for an antibiotic resistant organism. Patient reportedly has not tested positive for osteomyelitis. Patient reportedly has not had testing performed to evaluate circulation in the legs. Patient experiences the following problems associated with their wounds: infection. Patient History Information obtained from Patient. Allergies No Known Drug Allergies Family History [name] [name], [name] [name] ([id number]) Heart Disease - Mother, Hypertension - Father, No family history of Cancer, Diabetes, Kidney Disease, Lung Disease, Seizures, Stroke, Thyroid Problems, Tuberculosis. Social History Never smoker, Marital Status - Single, Alcohol Use - Moderate, Drug Use - No History, Caffeine Use - Daily. Medical History Eyes Denies history of Cataracts, Glaucoma, Optic Neuritis Ear/Nose/Mouth/Throat Denies history of  Chronic sinus problems/congestion, Middle ear problems Hematologic/Lymphatic Denies history of Anemia, Hemophilia, Human Immunodeficiency Virus, Lymphedema, Sickle Cell Disease Respiratory Patient has history of Sleep Apnea Denies history of Aspiration, Asthma, Chronic Obstructive Pulmonary Disease (COPD), Pneumothorax, Tuberculosis Cardiovascular Patient has history of Angina, Congestive Heart Failure, Coronary Artery Disease, Hypertension, Myocardial Infarction Denies history of Arrhythmia, Deep Vein Thrombosis, Hypotension, Peripheral Arterial Disease, Peripheral Venous Disease, Phlebitis, Vasculitis Gastrointestinal Denies history of Cirrhosis , Colitis, Crohn s, Hepatitis A, Hepatitis B, Hepatitis C Endocrine Denies history of Type I Diabetes, Type II Diabetes Genitourinary Denies history of End Stage Renal Disease Immunological Denies history  of Lupus Erythematosus, Raynaud s, Scleroderma Integumentary (Skin) Denies history of History of Burn, History of pressure wounds Musculoskeletal Patient has history of Gout Denies history of Rheumatoid Arthritis, Osteoarthritis, Osteomyelitis Neurologic Denies history of Dementia, Neuropathy, Quadriplegia, Paraplegia, Seizure Disorder Psychiatric Denies history of Anorexia/bulimia, Confinement Anxiety Hospitalization/Surgery History - 03/30/2016, [facility], CABG. Medical And Surgical History Notes Constitutional Symptoms (General Health) CABG x 2 [facility] (Aug 2013 and 2017); 2 Stents (Jan 2014); Diverticulitis; HTN; Gout Review of Systems (ROS) Constitutional Symptoms (General Health) The patient has no complaints or symptoms. Eyes [name] [name], [name] [name] ([id number]) Denies complaints or symptoms of Dry Eyes, Vision Changes, Glasses / Contacts. Ear/Nose/Mouth/Throat The patient has no complaints or symptoms. Hematologic/Lymphatic The patient has no complaints or symptoms. Respiratory Complains or has symptoms of Shortness of Breath. Denies complaints or symptoms of Chronic or frequent coughs. Cardiovascular The patient has no complaints or symptoms. Gastrointestinal The patient has no complaints or symptoms. Endocrine Denies complaints or symptoms of Hepatitis, Thyroid disease, Polydypsia (Excessive Thirst). Genitourinary The patient has no complaints or symptoms. Immunological The patient has no complaints or symptoms. Integumentary (Skin) Complains or has symptoms of Wounds. Denies complaints or symptoms of Bleeding or bruising tendency, Breakdown, Swelling. Musculoskeletal Complains or has symptoms of Muscle Pain, Muscle Weakness. Neurologic Complains or has symptoms of Numbness/parasthesias - right leg vein removal caused numbness. Psychiatric The patient has no complaints or symptoms. Medications gabapentin 600 mg tablet oral tablet oral atorvastatin 40 mg tablet oral tablet  oral losartan 100 mg tablet oral tablet oral metoprolol tartrate 50 mg tablet oral tablet oral amlodipine 10 mg tablet oral tablet oral allopurinol 300 mg tablet oral tablet oral Multiple Vitamin-Minerals tablet oral tablet oral hydrocodone 5 mg-acetaminophen 325 mg tablet oral tablet oral aspirin 81 mg chewable tablet oral tablet,chewable oral potassium 99 mg tablet oral tablet oral hydrochlorothiazide 25 mg tablet oral tablet oral isosorbide mononitrate ER 30 mg tablet,extended release 24 hr oral tablet extended release 24 hr oral nitroglycerin 0.4 mg sublingual tablet sublingual tablet, sublingual sublingual cholecalciferol (vitamin D3) 2,000 unit tablet oral tablet oral [name], [name] T. ([id number]) Objective Constitutional Pulse regular. Respirations normal and unlabored. Afebrile. Vitals Time Taken: 8:20 AM, Height: 63 in, Source: Stated, Weight: 151 lbs, Source: Measured, BMI: 26.7, Temperature: 97.5 F, Pulse: 78 bpm, Respiratory Rate: 18 breaths/min, Blood Pressure: 122/70 mmHg. Eyes Nonicteric. Reactive to light. Ears, Nose, Mouth, and Throat Lips, teeth, and gums WNL.Marland Kitchen Moist mucosa without lesions. Neck supple and nontender. No palpable supraclavicular or cervical adenopathy. Normal sized without goiter. Respiratory WNL. No retractions.. Cardiovascular Pedal Pulses WNL. No clubbing, cyanosis or edema. Gastrointestinal (GI) Abdomen without masses or tenderness.. No liver or spleen enlargement or tenderness.. Lymphatic No adneopathy. No adenopathy. No adenopathy. Musculoskeletal Adexa without tenderness or enlargement.. Digits and nails w/o clubbing, cyanosis, infection, petechiae, ischemia, or inflammatory conditions.Marland Kitchen Psychiatric Judgement and insight Intact.. No evidence of depression,  anxiety, or agitation.. General Notes: he has a healed supple scar in the mid back region where he probably had a pressure injury stage I which is now healed Integumentary (Hair,  Skin) No suspicious lesions. No crepitus or fluctuance. No peri-wound warmth or erythema. No masses.. Wound #1 status is Healed - Epithelialized. Original cause of wound was Pressure Injury. The wound is located on the Medial Back. The wound measures 0cm length x 0cm width x 0cm depth; 0cm^2 area and 0cm^3 volume. The wound is limited to skin breakdown. There is no tunneling or undermining noted. There is a small amount of serous drainage noted. The wound margin is indistinct and nonvisible. There is large (67-100%) granulation within the wound bed. There is no necrotic tissue within the wound bed. The [name] [name], [name] T. ([id number]) periwound skin appearance exhibited: Scarring. The periwound skin appearance did not exhibit: Callus, Crepitus, Excoriation, Fluctuance, Friable, Induration, Localized Edema, Rash, Dry/Scaly, Maceration, Moist, Atrophie Blanche, Cyanosis, Ecchymosis, Hemosiderin Staining, Mottled, Pallor, Rubor, Erythema. Assessment Active Problems ICD-10 L89.111 - Pressure ulcer of right upper back, stage 1 L89.121 - Pressure ulcer of left upper back, stage 1 This gentleman who is awake alert and mobile had a superficial stage I pressure injury to his mid back area at the time of his cardiac surgery. The area has a supple scar and a well-healed pressure injury which I have asked him to protect with a bordered foam dressing, changed every other day. Offloading has also been discussed with him and he will be compliant He need not return to see [place] unless there is a fresh problem. Plan Discharge From [facility] Services: Discharge from [facility] - treatment complete This gentleman who is awake alert and mobile had a superficial stage I pressure injury to his mid back area at the time of his cardiac surgery. the area has a supple scar and a well-healed pressure injury which I have asked him to protect with a bordered foam dressing, changed every other day. Offloading has also been  discussed with him and he will be compliant He need not return to see [place] unless there is a fresh problem. [name] [name], [name] [name] ([id number]) Electronic Signature(s) Signed: 05/24/2016 9:02:23 AM By: Christin Fudge MD, FACS Previous Signature: 05/24/2016 8:57:07 AM Version By: Christin Fudge MD, FACS Entered By: Christin Fudge on 05/24/2016 09:02:22 [name] [name] ([id number]) -------------------------------------------------------------------------------- ROS/PFSH Details Patient Name: [name] [name] Date of Service: 05/24/2016 8:00 AM Medical Record Number: [id number] Patient Account Number: [id number] Date of Birth/Sex: [birth year] (60 y.o. Male) Treating RN: Cornell Barman Primary Care Physician: Elisabeth Cara Other Clinician: Referring Physician: SELF, REFERRED Treating Physician/Extender: Frann Rider in Treatment: 0 Information Obtained From Patient Wound History Do you currently have one or more open woundso Yes How many open wounds do you currently haveo 1 Approximately how long have you had your woundso 6 weeks How have you been treating your wound(s) until nowo covering with bandage Has your wound(s) ever healed and then re-openedo No Have you had any lab work done in the past montho Yes Have you tested positive for an antibiotic resistant organism (MRSA, VRE)o No Have you tested positive for osteomyelitis (bone infection)o No Have you had any tests for circulation on your legso No Have you had other problems associated with your woundso Infection Constitutional Symptoms (General Health) Complaints and Symptoms: No Complaints or Symptoms Complaints and Symptoms: Negative for: Fatigue; Fever; Chills; Marked Weight Change Medical History: Past Medical History Notes: CABG x 2 [facility] (  Aug 2013 and 2017); 2 Stents (Jan 2014); Diverticulitis; HTN; Gout Eyes Complaints and Symptoms: Negative for: Dry Eyes; Vision Changes; Glasses / Contacts Medical History: Negative for:  Cataracts; Glaucoma; Optic Neuritis Respiratory Complaints and Symptoms: Positive for: Shortness of Breath Negative for: Chronic or frequent coughs Medical History: Positive for: Sleep Apnea [name] [name], [name] T. ([id number]) Negative for: Aspiration; Asthma; Chronic Obstructive Pulmonary Disease (COPD); Pneumothorax; Tuberculosis Endocrine Complaints and Symptoms: Negative for: Hepatitis; Thyroid disease; Polydypsia (Excessive Thirst) Medical History: Negative for: Type I Diabetes; Type II Diabetes Integumentary (Skin) Complaints and Symptoms: Positive for: Wounds Negative for: Bleeding or bruising tendency; Breakdown; Swelling Medical History: Negative for: History of Burn; History of pressure wounds Musculoskeletal Complaints and Symptoms: Positive for: Muscle Pain; Muscle Weakness Medical History: Positive for: Gout Negative for: Rheumatoid Arthritis; Osteoarthritis; Osteomyelitis Neurologic Complaints and Symptoms: Positive for: Numbness/parasthesias - right leg vein removal caused numbness Medical History: Negative for: Dementia; Neuropathy; Quadriplegia; Paraplegia; Seizure Disorder Ear/Nose/Mouth/Throat Complaints and Symptoms: No Complaints or Symptoms Medical History: Negative for: Chronic sinus problems/congestion; Middle ear problems Hematologic/Lymphatic Complaints and Symptoms: No Complaints or Symptoms Medical HistoryHUTTON, [name] [name] ([id number]) Negative for: Anemia; Hemophilia; Human Immunodeficiency Virus; Lymphedema; Sickle Cell Disease Cardiovascular Complaints and Symptoms: No Complaints or Symptoms Medical History: Positive for: Angina; Congestive Heart Failure; Coronary Artery Disease; Hypertension; Myocardial Infarction Negative for: Arrhythmia; Deep Vein Thrombosis; Hypotension; Peripheral Arterial Disease; Peripheral Venous Disease; Phlebitis; Vasculitis Gastrointestinal Complaints and Symptoms: No Complaints or Symptoms Medical  History: Negative for: Cirrhosis ; Colitis; Crohnos; Hepatitis A; Hepatitis B; Hepatitis C Genitourinary Complaints and Symptoms: No Complaints or Symptoms Medical History: Negative for: End Stage Renal Disease Immunological Complaints and Symptoms: No Complaints or Symptoms Medical History: Negative for: Lupus Erythematosus; Raynaudos; Scleroderma Psychiatric Complaints and Symptoms: No Complaints or Symptoms Medical History: Negative for: Anorexia/bulimia; Confinement Anxiety Immunizations Pneumococcal Vaccine: Received Pneumococcal Vaccination: No Hospitalization / Surgery History Name of Hospital Purpose of Hospitalization/Surgery Date [name] [name], [name] [name] ([id number]) [facility] CABG 03/30/2016 Family and Social History Cancer: No; Diabetes: No; Heart Disease: Yes - Mother; Hypertension: Yes - Father; Kidney Disease: No; Lung Disease: No; Seizures: No; Stroke: No; Thyroid Problems: No; Tuberculosis: No; Never smoker; Marital Status - Single; Alcohol Use: Moderate; Drug Use: No History; Caffeine Use: Daily; Advanced Directives: Yes (Not Provided); Patient does not want information on Advanced Directives; Do not resuscitate: No; Living Will: Yes (Copy provided); Medical Power of Attorney: Yes (Not Provided) Physician Affirmation I have reviewed and agree with the above information. Electronic Signature(s) Signed: 05/24/2016 11:43:27 AM By: Christin Fudge MD, FACS Signed: 05/25/2016 10:50:50 AM By: Gretta Cool RN, BSN, Kim RN, BSN Entered By: Christin Fudge on 05/24/2016 08:40:50 [name] [name] ([id number]) -------------------------------------------------------------------------------- SuperBill Details Patient Name: [name] [name] Date of Service: 05/24/2016 Medical Record Number: [id number] Patient Account Number: [id number] Date of Birth/Sex: [birth year] (60 y.o. Male) Treating RN: Cornell Barman Primary Care Physician: Elisabeth Cara Other Clinician: Referring Physician: SELF,  REFERRED Treating Physician/Extender: Frann Rider in Treatment: 0 Diagnosis Coding ICD-10 Codes Code Description L89.111 Pressure ulcer of right upper back, stage 1 L89.121 Pressure ulcer of left upper back, stage 1 Facility Procedures CPT4 Code: [id number] Description: 99213 - WOUND CARE VISIT-LEV 3 EST PT Modifier: Quantity: 1 Physician Procedures CPT4 [id number] Description: WC PHYS LEVEL 3 o NEW PT ICD-10 Description Diagnosis L89.111 Pressure ulcer of right upper back, stage 1 L89.121 Pressure ulcer of left upper back, stage 1 Modifier: Quantity: 1 Electronic Signature(s) Signed: 05/24/2016 8:57:24 AM By: Christin Fudge MD, FACS Entered  By: Christin Fudge on 05/24/2016 08:57:23

## 2016-05-28 ENCOUNTER — Encounter: Payer: Self-pay | Admitting: Emergency Medicine

## 2016-05-28 ENCOUNTER — Emergency Department: Payer: Medicare Other

## 2016-05-28 ENCOUNTER — Inpatient Hospital Stay
Admission: EM | Admit: 2016-05-28 | Discharge: 2016-05-29 | DRG: 194 | Disposition: A | Discharge status: Home / Self Care | Payer: Medicare Other | Attending: Internal Medicine | Admitting: Internal Medicine

## 2016-05-28 DIAGNOSIS — Z8249 Family history of ischemic heart disease and other diseases of the circulatory system: Secondary | ICD-10-CM | POA: Diagnosis not present

## 2016-05-28 DIAGNOSIS — M109 Gout, unspecified: Secondary | ICD-10-CM | POA: Diagnosis present

## 2016-05-28 DIAGNOSIS — Z79899 Other long term (current) drug therapy: Secondary | ICD-10-CM | POA: Diagnosis not present

## 2016-05-28 DIAGNOSIS — G4730 Sleep apnea, unspecified: Secondary | ICD-10-CM | POA: Diagnosis present

## 2016-05-28 DIAGNOSIS — J189 Pneumonia, unspecified organism: Secondary | ICD-10-CM | POA: Diagnosis present

## 2016-05-28 DIAGNOSIS — Y95 Nosocomial condition: Secondary | ICD-10-CM

## 2016-05-28 DIAGNOSIS — Z7982 Long term (current) use of aspirin: Secondary | ICD-10-CM

## 2016-05-28 DIAGNOSIS — Z951 Presence of aortocoronary bypass graft: Secondary | ICD-10-CM

## 2016-05-28 DIAGNOSIS — I429 Cardiomyopathy, unspecified: Secondary | ICD-10-CM | POA: Diagnosis present

## 2016-05-28 DIAGNOSIS — Z87891 Personal history of nicotine dependence: Secondary | ICD-10-CM | POA: Diagnosis not present

## 2016-05-28 DIAGNOSIS — G629 Polyneuropathy, unspecified: Secondary | ICD-10-CM | POA: Diagnosis present

## 2016-05-28 DIAGNOSIS — I10 Essential (primary) hypertension: Secondary | ICD-10-CM | POA: Diagnosis present

## 2016-05-28 DIAGNOSIS — I2510 Atherosclerotic heart disease of native coronary artery without angina pectoris: Secondary | ICD-10-CM | POA: Diagnosis present

## 2016-05-28 DIAGNOSIS — G8929 Other chronic pain: Secondary | ICD-10-CM | POA: Diagnosis present

## 2016-05-28 DIAGNOSIS — A419 Sepsis, unspecified organism: Secondary | ICD-10-CM

## 2016-05-28 DIAGNOSIS — Z23 Encounter for immunization: Secondary | ICD-10-CM | POA: Diagnosis not present

## 2016-05-28 DIAGNOSIS — L8990 Pressure ulcer of unspecified site, unspecified stage: Secondary | ICD-10-CM | POA: Insufficient documentation

## 2016-05-28 LAB — CBC
HEMATOCRIT: 41.1 % (ref 40.0–52.0)
HEMOGLOBIN: 14.4 g/dL (ref 13.0–18.0)
MCH: 30.5 pg (ref 26.0–34.0)
MCHC: 35 g/dL (ref 32.0–36.0)
MCV: 87 fL (ref 80.0–100.0)
Platelets: 174 10*3/uL (ref 150–440)
RBC: 4.72 MIL/uL (ref 4.40–5.90)
RDW: 15.5 % — ABNORMAL HIGH (ref 11.5–14.5)
WBC: 8.4 10*3/uL (ref 3.8–10.6)

## 2016-05-28 LAB — URINALYSIS COMPLETE WITH MICROSCOPIC (ARMC ONLY)
BACTERIA UA: NONE SEEN
Bilirubin Urine: NEGATIVE
Glucose, UA: NEGATIVE mg/dL
Hgb urine dipstick: NEGATIVE
KETONES UR: NEGATIVE mg/dL
Leukocytes, UA: NEGATIVE
NITRITE: NEGATIVE
PH: 5 (ref 5.0–8.0)
PROTEIN: NEGATIVE mg/dL
RBC / HPF: NONE SEEN RBC/hpf (ref 0–5)
SPECIFIC GRAVITY, URINE: 1.013 (ref 1.005–1.030)
Squamous Epithelial / LPF: NONE SEEN
WBC UA: NONE SEEN WBC/hpf (ref 0–5)

## 2016-05-28 LAB — BASIC METABOLIC PANEL
ANION GAP: 12 (ref 5–15)
BUN: 17 mg/dL (ref 6–20)
CALCIUM: 9.7 mg/dL (ref 8.9–10.3)
CO2: 25 mmol/L (ref 22–32)
Chloride: 99 mmol/L — ABNORMAL LOW (ref 101–111)
Creatinine, Ser: 0.98 mg/dL (ref 0.61–1.24)
GLUCOSE: 138 mg/dL — AB (ref 65–99)
POTASSIUM: 3.7 mmol/L (ref 3.5–5.1)
SODIUM: 136 mmol/L (ref 135–145)

## 2016-05-28 LAB — TROPONIN I

## 2016-05-28 LAB — BLOOD GAS, VENOUS
ACID-BASE EXCESS: 2.7 mmol/L — AB (ref 0.0–2.0)
Bicarbonate: 27.9 mmol/L (ref 20.0–28.0)
PATIENT TEMPERATURE: 37
PCO2 VEN: 44 mmHg (ref 44.0–60.0)
PH VEN: 7.41 (ref 7.250–7.430)

## 2016-05-28 LAB — LACTIC ACID, PLASMA: Lactic Acid, Venous: 1.4 mmol/L (ref 0.5–1.9)

## 2016-05-28 MED ORDER — ISOSORBIDE MONONITRATE ER 30 MG PO TB24
30.0000 mg | ORAL_TABLET | Freq: Every morning | ORAL | Status: DC
  Administered 2016-05-28 – 2016-05-29 (×2): 30 mg via ORAL
  Filled 2016-05-28 (×2): qty 1

## 2016-05-28 MED ORDER — DEXTROSE 5 % IV SOLN
2.0000 g | Freq: Once | INTRAVENOUS | Status: AC
  Administered 2016-05-28: 2 g via INTRAVENOUS
  Filled 2016-05-28: qty 2

## 2016-05-28 MED ORDER — FAMOTIDINE 20 MG PO TABS
20.0000 mg | ORAL_TABLET | Freq: Every day | ORAL | Status: DC
Start: 2016-05-28 — End: 2016-05-29
  Administered 2016-05-28: 20 mg via ORAL
  Filled 2016-05-28 (×2): qty 1

## 2016-05-28 MED ORDER — ACETAMINOPHEN 650 MG RE SUPP: 650.0000 mg | Freq: Four times a day (QID) | RECTAL | Status: DC | PRN

## 2016-05-28 MED ORDER — ALBUTEROL SULFATE (2.5 MG/3ML) 0.083% IN NEBU: 5.0000 mg | INHALATION_SOLUTION | Freq: Once | RESPIRATORY_TRACT | Status: DC

## 2016-05-28 MED ORDER — ASPIRIN EC 81 MG PO TBEC
81.0000 mg | DELAYED_RELEASE_TABLET | Freq: Every morning | ORAL | Status: DC
  Administered 2016-05-28 – 2016-05-29 (×2): 81 mg via ORAL
  Filled 2016-05-28 (×2): qty 1

## 2016-05-28 MED ORDER — HYDROCODONE-ACETAMINOPHEN 5-325 MG PO TABS
1.0000 | ORAL_TABLET | Freq: Four times a day (QID) | ORAL | Status: DC | PRN
  Administered 2016-05-28 – 2016-05-29 (×3): 1 via ORAL
  Filled 2016-05-28 (×3): qty 1

## 2016-05-28 MED ORDER — VITAMIN D 1000 UNITS PO TABS
2000.0000 [IU] | ORAL_TABLET | Freq: Every morning | ORAL | Status: DC
  Administered 2016-05-28 – 2016-05-29 (×2): 2000 [IU] via ORAL
  Filled 2016-05-28 (×2): qty 2

## 2016-05-28 MED ORDER — SODIUM CHLORIDE 0.9 % IV BOLUS (SEPSIS)
1000.0000 mL | Freq: Once | INTRAVENOUS | Status: AC
  Administered 2016-05-28: 1000 mL via INTRAVENOUS

## 2016-05-28 MED ORDER — SODIUM CHLORIDE 0.9 % IV BOLUS (SEPSIS): 250.0000 mL | Freq: Once | INTRAVENOUS | Status: DC

## 2016-05-28 MED ORDER — ATORVASTATIN CALCIUM 20 MG PO TABS
40.0000 mg | ORAL_TABLET | Freq: Every day | ORAL | Status: DC
  Administered 2016-05-28: 40 mg via ORAL
  Filled 2016-05-28: qty 2

## 2016-05-28 MED ORDER — ENOXAPARIN SODIUM 40 MG/0.4ML ~~LOC~~ SOLN
40.0000 mg | SUBCUTANEOUS | Status: DC
  Administered 2016-05-28 – 2016-05-29 (×2): 40 mg via SUBCUTANEOUS
  Filled 2016-05-28 (×2): qty 0.4

## 2016-05-28 MED ORDER — ACETAMINOPHEN 325 MG PO TABS: 650.0000 mg | ORAL_TABLET | Freq: Four times a day (QID) | ORAL | Status: DC | PRN

## 2016-05-28 MED ORDER — DEXTROSE 5 % IV SOLN
1.0000 g | INTRAVENOUS | Status: DC
  Administered 2016-05-28: 1 g via INTRAVENOUS
  Filled 2016-05-28 (×2): qty 10

## 2016-05-28 MED ORDER — SODIUM CHLORIDE 0.9% FLUSH
3.0000 mL | Freq: Two times a day (BID) | INTRAVENOUS | Status: DC
  Administered 2016-05-28 (×2): 3 mL via INTRAVENOUS

## 2016-05-28 MED ORDER — SODIUM CHLORIDE 0.9 % IV BOLUS (SEPSIS): 1000.0000 mL | Freq: Once | INTRAVENOUS | Status: DC

## 2016-05-28 MED ORDER — NITROGLYCERIN 0.4 MG SL SUBL: 0.4000 mg | SUBLINGUAL_TABLET | SUBLINGUAL | Status: DC | PRN

## 2016-05-28 MED ORDER — VANCOMYCIN HCL IN DEXTROSE 1-5 GM/200ML-% IV SOLN
1000.0000 mg | Freq: Once | INTRAVENOUS | Status: AC
  Administered 2016-05-28: 1000 mg via INTRAVENOUS
  Filled 2016-05-28: qty 200

## 2016-05-28 MED ORDER — AZITHROMYCIN 500 MG PO TABS
500.0000 mg | ORAL_TABLET | Freq: Every day | ORAL | Status: DC
  Administered 2016-05-28 – 2016-05-29 (×2): 500 mg via ORAL
  Filled 2016-05-28 (×2): qty 1

## 2016-05-28 MED ORDER — SENNOSIDES-DOCUSATE SODIUM 8.6-50 MG PO TABS
1.0000 | ORAL_TABLET | Freq: Two times a day (BID) | ORAL | Status: DC
  Administered 2016-05-28 – 2016-05-29 (×3): 1 via ORAL
  Filled 2016-05-28 (×3): qty 1

## 2016-05-28 MED ORDER — METOPROLOL TARTRATE 50 MG PO TABS
50.0000 mg | ORAL_TABLET | Freq: Three times a day (TID) | ORAL | Status: DC
  Administered 2016-05-28 – 2016-05-29 (×2): 50 mg via ORAL
  Filled 2016-05-28 (×2): qty 1

## 2016-05-28 MED ORDER — ADULT MULTIVITAMIN W/MINERALS CH
1.0000 | ORAL_TABLET | Freq: Every evening | ORAL | Status: DC
  Administered 2016-05-28: 1 via ORAL
  Filled 2016-05-28: qty 1

## 2016-05-28 MED ORDER — ACETAMINOPHEN 500 MG PO TABS
1000.0000 mg | ORAL_TABLET | Freq: Once | ORAL | Status: AC
  Administered 2016-05-28: 1000 mg via ORAL
  Filled 2016-05-28: qty 2

## 2016-05-28 MED ORDER — ONDANSETRON HCL 4 MG PO TABS: 4.0000 mg | ORAL_TABLET | Freq: Four times a day (QID) | ORAL | Status: DC | PRN

## 2016-05-28 MED ORDER — ALLOPURINOL 300 MG PO TABS
300.0000 mg | ORAL_TABLET | Freq: Every day | ORAL | Status: DC
  Administered 2016-05-28: 300 mg via ORAL
  Filled 2016-05-28: qty 1

## 2016-05-28 MED ORDER — GABAPENTIN 600 MG PO TABS
600.0000 mg | ORAL_TABLET | Freq: Two times a day (BID) | ORAL | Status: DC
  Administered 2016-05-28 – 2016-05-29 (×2): 600 mg via ORAL
  Filled 2016-05-28 (×2): qty 1

## 2016-05-28 MED ORDER — ONDANSETRON HCL 4 MG/2ML IJ SOLN: 4.0000 mg | Freq: Four times a day (QID) | INTRAMUSCULAR | Status: DC | PRN

## 2016-05-28 MED ORDER — PNEUMOCOCCAL VAC POLYVALENT 25 MCG/0.5ML IJ INJ
0.5000 mL | INJECTION | INTRAMUSCULAR | Status: AC
  Administered 2016-05-29: 0.5 mL via INTRAMUSCULAR
  Filled 2016-05-28: qty 0.5

## 2016-05-28 NOTE — ED Triage Notes (Signed)
Pt reports 2 days of sore throat, productive cough and fever; also feeling short of breath, does not worsen with exertion; pt denies chest pain; alert and oriented x 3; talking in complete coherent sentences

## 2016-05-28 NOTE — Care Management Important Message (Signed)
Important Message  Patient Details  Name: [name] [name]. MRN: [id number] Date of Birth: 1956/01/06   Medicare Important Message Given:  Yes    Jolly Mango, RN 05/28/2016, 9:15 AM

## 2016-05-28 NOTE — Progress Notes (Signed)
Pharmacy Antibiotic Note  Cope Benzon. is a 60 y.o. male admitted on 05/28/2016 with pneumonia.  Pharmacy has been consulted for ceftriaxone dosing.  Plan: Ceftriaxone 1g IV Q24h. Azithromycin 500mg  PO daily ordered.   Height: 5\' 3"  (160 cm) Weight: 154 lb (69.9 kg) IBW/kg (Calculated) : 56.9  Temp (24hrs), Avg:99.2 F (37.3 C), Min:98.4 F (36.9 C), Max:100 F (37.8 C)   Recent Labs Lab 05/28/16 0432 05/28/16 0757  WBC 8.4  --   CREATININE 0.98  --   LATICACIDVEN  --  1.4    Estimated Creatinine Clearance: 68.6 mL/min (by C-G formula based on SCr of 0.98 mg/dL).    No Known Allergies  Antimicrobials this admission: Cefepime 9/25 >> 9/25 Vancomycin 9/25 >> 9/25 Azithromycin 9/25 >> Ceftriaxone 9/25 >>  Dose adjustments this admission:   Microbiology results: 9/25 BCx: P 9/25 UCx: needs collection 9/25: UA negative  Thank you for allowing pharmacy to be a part of this patient's care.  Loree Fee, PharmD 05/28/2016 3:53 PM

## 2016-05-28 NOTE — H&P (Signed)
[facility] [place] NAME: [name] [name]    MR#:  [id number]  DATE OF BIRTH:  08/10/1956  DATE OF ADMISSION:  05/28/2016  PRIMARY CARE PHYSICIAN: Elisabeth Cara, NP   REQUESTING/REFERRING PHYSICIAN: Dr. Eula Listen  CHIEF COMPLAINT:   Chief Complaint  Patient presents with  . Sore Throat  . Shortness of Breath  . Fever  . Cough    HISTORY OF PRESENT ILLNESS:  [name] [name]  is a 60 y.o. male with a known history of CAD status post redo CABG in July 2017, cardiomyopathy with last EF of 45%, hypertension, esophageal stricture presents to the hospital secondary to worsening shortness of breath and productive cough. Patient got released from rehabilitation about 5 weeks ago after his bypass surgery. Has been doing fine without any worsening chest pains. He has minimal shortness of breath on exertion at baseline. But over the last 3-4 days he said it has gotten worse. Symptoms started with upper respiratory tract infection with cough and congestion and now turning into more productive cough. He has been having fevers on and off yesterday. Since his discharge from rehabilitation, he has been using a walker to ambulate. He lives at home by himself. In the ER chest x-ray revealed bibasilar infiltrates. He has a fever, normal white count though. But getting very short of breath on minimal exertion. Denies any nausea or vomiting at this time. No abdominal pain or diarrhea or other symptoms.  PAST MEDICAL HISTORY:   Past Medical History:  Diagnosis Date  . Benign esophageal stricture   . Cardiomyopathy, secondary ([place])   . Coronary artery disease   . Diverticulitis   . Gout   . Hemorrhoids   . Hyperlipidemia   . Hypertension   . Sleep apnea   . Tubular adenoma of colon     PAST SURGICAL HISTORY:   Past Surgical History:  Procedure Laterality Date  . cardiac bypass    . CARDIAC CATHETERIZATION Left 02/03/2016   Procedure: Left  Heart Cath and Coronary Angiography;  Surgeon: Teodoro Spray, MD;  Location: [facility];  Service: Cardiovascular;  Laterality: Left;  . CARDIAC SURGERY    . COLONOSCOPY WITH PROPOFOL N/A 04/29/2015   Procedure: COLONOSCOPY WITH PROPOFOL;  Surgeon: Hulen Luster, MD;  Location: [facility] ENDOSCOPY;  Service: Gastroenterology;  Laterality: N/A;  . CORONARY ANGIOPLASTY WITH STENT PLACEMENT     x2  . CORONARY ARTERY BYPASS GRAFT    . ESOPHAGOGASTRODUODENOSCOPY (EGD) WITH PROPOFOL N/A 04/29/2015   Procedure: ESOPHAGOGASTRODUODENOSCOPY (EGD) WITH PROPOFOL;  Surgeon: Hulen Luster, MD;  Location: [facility] ENDOSCOPY;  Service: Gastroenterology;  Laterality: N/A;  . SEPTOPLASTY      SOCIAL HISTORY:   Social History  Substance Use Topics  . Smoking status: Former Smoker    Years: 25.00  . Smokeless tobacco: Former Systems developer  . Alcohol use Yes     Comment: occasional    FAMILY HISTORY:   Family History  Problem Relation Age of Onset  . Heart disease Mother   . Hypertension Father     DRUG ALLERGIES:  No Known Allergies  REVIEW OF SYSTEMS:   Review of Systems  Constitutional: Positive for fever and malaise/fatigue. Negative for chills and weight loss.  HENT: Negative for ear discharge, ear pain, hearing loss, nosebleeds and tinnitus.   Eyes: Negative for blurred vision, double vision and photophobia.  Respiratory: Positive for cough and shortness of breath. Negative for hemoptysis and wheezing.  Cardiovascular: Negative for chest pain, palpitations, orthopnea and leg swelling.  Gastrointestinal: Negative for abdominal pain, constipation, diarrhea, heartburn, melena, nausea and vomiting.  Genitourinary: Negative for dysuria, frequency, hematuria and urgency.  Musculoskeletal: Negative for back pain, myalgias and neck pain.  Skin: Negative for rash.  Neurological: Positive for headaches. Negative for dizziness, tingling, tremors, sensory change, speech change and focal weakness.    Endo/Heme/Allergies: Does not bruise/bleed easily.  Psychiatric/Behavioral: Negative for depression.    MEDICATIONS AT HOME:   Prior to Admission medications   Medication Sig Start Date End Date Taking? Authorizing Provider  allopurinol (ZYLOPRIM) 300 MG tablet Take 300 mg by mouth daily. *patient takes in the afternoon*   Yes Historical Provider, MD  amLODipine (NORVASC) 10 MG tablet Take 10 mg by mouth daily. *patient takes this medication in the afternoon*   Yes Historical Provider, MD  aspirin EC 81 MG tablet Take 81 mg by mouth every morning.   Yes Historical Provider, MD  atorvastatin (LIPITOR) 40 MG tablet Take 40 mg by mouth at bedtime.    Yes Historical Provider, MD  Blood Glucose Monitoring Suppl (FIFTY50 GLUCOSE METER 2.0) w/Device KIT Use as directed. Product selection permitted according to insurance preference. E11.9 Type 2 diabetes mellitus 04/09/16 04/09/17 Yes Historical Provider, MD  Cholecalciferol (VITAMIN D3) 2000 units TABS Take 2,000 Units by mouth every morning.   Yes Historical Provider, MD  gabapentin (NEURONTIN) 600 MG tablet Take by mouth. 04/12/16  Yes Historical Provider, MD  hydrochlorothiazide (HYDRODIURIL) 25 MG tablet Take 25 mg by mouth every morning.    Yes Historical Provider, MD  HYDROcodone-acetaminophen (NORCO/VICODIN) 5-325 MG tablet Take 1 tablet by mouth 2 (two) times daily as needed for severe pain. 05/21/16 06/20/16 Yes Milinda Pointer, MD  HYDROcodone-acetaminophen (NORCO/VICODIN) 5-325 MG tablet Take 1 tablet by mouth 2 (two) times daily as needed for severe pain. 06/20/16 07/20/16 Yes Milinda Pointer, MD  HYDROcodone-acetaminophen (NORCO/VICODIN) 5-325 MG tablet Take 1 tablet by mouth 2 (two) times daily as needed for severe pain. 07/20/16 08/19/16 Yes Milinda Pointer, MD  isosorbide mononitrate (IMDUR) 30 MG 24 hr tablet Take 30 mg by mouth every morning.    Yes Historical Provider, MD  Lancet Devices ([employer] LANCING DEVICE) MISC Use 1 each 3  (three) times daily. Product selection permitted according to insurance preference. E11.9 Type 2 diabetes mellitus 04/09/16 04/09/17 Yes Historical Provider, MD  losartan (COZAAR) 100 MG tablet Take 100 mg by mouth at bedtime.  03/07/16  Yes Historical Provider, MD  metoprolol (LOPRESSOR) 50 MG tablet Take 50 mg by mouth 3 (three) times daily.   Yes Historical Provider, MD  Multiple Vitamin (MULTIVITAMIN WITH MINERALS) TABS tablet Take 1 tablet by mouth every evening.   Yes Historical Provider, MD  nitroGLYCERIN (NITROSTAT) 0.4 MG SL tablet Place 0.4 mg under the tongue every 5 (five) minutes x 3 doses as needed for chest pain.  03/07/16  Yes Historical Provider, MD  Potassium 95 MG TABS Take 95 mg by mouth every morning.    Yes Historical Provider, MD  senna (SENOKOT) 8.6 MG tablet Take 1 tablet by mouth at bedtime.  04/12/16 04/12/17 Yes Historical Provider, MD      VITAL SIGNS:  Blood pressure (!) 105/51, pulse 83, temperature 98.6 F (37 C), temperature source Oral, resp. rate 16, height _0  (1.6 m), weight 69.9 kg (154 lb), SpO2 96 %.  PHYSICAL EXAMINATION:   Physical Exam  GENERAL:  60 y.o.-year-old patient lying in the bed with no acute distress.  EYES: Pupils equal, round, reactive to light and accommodation. No scleral icterus. Extraocular muscles intact.  HEENT: Head atraumatic, normocephalic. Oropharynx and nasopharynx clear.  NECK:  Supple, no jugular venous distention. No thyroid enlargement, no tenderness.  LUNGS: Normal breath sounds bilaterally, no wheezing, rales,rhonchi or crepitation. No use of accessory muscles of respiration. Decreased bibasilar breath sounds and some rhonchi at the bases CARDIOVASCULAR: S1, S2 normal. No  rubs, or gallops. CABG scar noted. 3/6 systolic murmur present ABDOMEN: Soft, nontender, nondistended. Bowel sounds present. No organomegaly or mass.  EXTREMITIES: No pedal edema, cyanosis, or clubbing.  NEUROLOGIC: Cranial nerves II through XII are intact.  Muscle strength 5/5 in all extremities. Sensation intact. Gait not checked.  PSYCHIATRIC: The patient is alert and oriented x 3.  SKIN: No obvious rash, lesion, or ulcer.   LABORATORY PANEL:   CBC  Recent Labs Lab 05/28/16 0432  WBC 8.4  HGB 14.4  HCT 41.1  PLT 174   ------------------------------------------------------------------------------------------------------------------  Chemistries   Recent Labs Lab 05/28/16 0432  NA 136  K 3.7  CL 99*  CO2 25  GLUCOSE 138*  BUN 17  CREATININE 0.98  CALCIUM 9.7   ------------------------------------------------------------------------------------------------------------------  Cardiac Enzymes  Recent Labs Lab 05/28/16 0944  TROPONINI <0.03   ------------------------------------------------------------------------------------------------------------------  RADIOLOGY:  Dg Chest 2 View  Result Date: 05/28/2016 CLINICAL DATA:  60 year old male with cough and fever and shortness of breath EXAM: CHEST  2 VIEW COMPARISON:  Chest CT dated 03/1916 FINDINGS: Two views of the chest demonstrate postsurgical changes and scarring in the left lower lobe. An area of hazy density over the left cardiac border may be postsurgical changes, however; developing infiltrate is not entirely excluded. The right lung is clear. There is no pleural effusion or pneumothorax. The cardiac silhouette is within normal limits. Multiple coronary vascular surgical clips noted. There has been interval removal of the previously seen median sternotomy wires and placement of external fixation hardware. Multiple surgical clips noted over the right upper lung field which I perineum compared the prior CT. No acute osseous pathology. IMPRESSION: Left lung base scarring. Postsurgical changes versus less likely developing infiltrate in the lingula. Postsurgical changes of CABG. Electronically Signed   By: Anner Crete M.D.   On: 05/28/2016 05:23    EKG:   Orders  placed or performed during the hospital encounter of 05/28/16  . ED EKG  . ED EKG  . EKG 12-Lead  . EKG 12-Lead  . EKG 12-Lead  . EKG 12-Lead    IMPRESSION AND PLAN:   [name] [name]  is a 60 y.o. male with a known history of CAD status post redo CABG in July 2017, cardiomyopathy with last EF of 45%, hypertension, esophageal stricture presents to the hospital secondary to worsening shortness of breath and productive cough.  #1 community-acquired pneumonia-blood cultures ordered. -Received vancomycin and cefepime in the emergency room and started on Rocephin and azithromycin in the hospital\ -O2 support if needed. - incentive spirometry  #2 CAD-status post CABG in 2013 and redo CABG in July 2017 -Follow-up with [facility] as needed. Cardiac rehabilitation. -Continue cardiac medications including aspirin, statin, Imdur and metoprolol.  #3 Hypertention-continue Imdur and metoprolol. Hold losartan and Norvasc until blood pressure is stable.  #4 neuropathy/chronic pain-continue gabapentin and Norco when necessary  #5 DVT prophylaxis-Lovenox    All the records are reviewed and case discussed with ED provider. Management plans discussed with the patient, family and they are in agreement.  CODE STATUS: Full code  TOTAL TIME  TAKING CARE OF THIS PATIENT: 50 minutes.    Gladstone Lighter M.D on 05/28/2016 at 3:27 PM  Between 7am to 6pm - Pager - [phone]  After 6pm go to [email or web address] - password [email or web address] [facility]  Office  [phone]  CC: Primary care physician; Elisabeth Cara, NP

## 2016-05-28 NOTE — ED Notes (Signed)
CODE SEPSIS CALLED TO [facility] AT [place] 

## 2016-05-28 NOTE — ED Provider Notes (Signed)
[facility] Emergency Department Provider Note  ____________________________________________  Time seen: Approximately 7:36 AM  I have reviewed the triage vital signs and the nursing notes.   HISTORY  Chief Complaint Sore Throat; Shortness of Breath; Fever; and Cough    HPI [name] [name]. is a 60 y.o. male w/ hx of CAD s/p CABG 7/17, cardiomyopathy, benign esophageal stricture presenting with 3-4 days of sore throat, congestion and clear rhinorrhea, cough productive of thick sputum, and exertional shortness of breath with fever. The patient denies any chest pain, ear pain, nausea vomiting or diarrhea, abdominal pain. No known sick contacts. Has been eating and drinking well. Has only taken acetaminophen in the setting of his hydrocodone for pain.   Past Medical History:  Diagnosis Date  . Benign esophageal stricture   . Cardiomyopathy, secondary ([place])   . Coronary artery disease   . Diverticulitis   . Gout   . Hemorrhoids   . Hyperlipidemia   . Hypertension   . Sleep apnea   . Tubular adenoma of colon     Patient Active Problem List   Diagnosis Date Noted  . Opioid-induced constipation (OIC) 05/17/2016  . Hypokalemia 05/01/2016  . 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 Carotid atherosclerosis 04/04/2016  . Hypoalbuminemia 04/04/2016  . Right sided weakness 04/02/2016  . S/P CABG (coronary artery bypass graft) 03/30/2016  . Chest pain 02/29/2016  . Unstable angina ([place]) 02/24/2016  . Keloid skin disorder 01/17/2016  . Costochondritis 01/17/2016  . Long term current use of opiate analgesic 12/23/2015  . Long term prescription opiate use 12/23/2015  . Vitamin D insufficiency 11/21/2015  . Lumbar facet syndrome (Bilateral) (R>L) 11/21/2015  . Lumbar spondylosis 11/21/2015  . Elevated sedimentation rate 11/01/2015  . Chronic low back pain (Bilateral) (R>L) 10/25/2015  . Chronic shoulder pain (Location of Tertiary source of pain) (Right)  10/25/2015  . Chronic hip pain (Left) 10/25/2015  . Chronic pain 10/25/2015  . History of alcoholism ([place]) 10/25/2015  . Opiate use 10/25/2015  . Encounter for therapeutic drug level monitoring 10/25/2015  . Chronic chest wall pain (Location of Primary Source of Pain) (Incisional Midline) (since 04/22/2012) 10/25/2015  . Chronic lower extremity pain (Location of Secondary source of pain) (Bilateral) (R>L) 10/25/2015  . History of MI (myocardial infarction) (January 2014) 10/25/2015  . Encounter for pain management planning 10/25/2015  . Chronic sacroiliac joint pain (Bilateral) (L>R) 10/25/2015  . Neuropathic pain 10/25/2015  . Neurogenic pain 10/25/2015  . Incisional pain 10/25/2015  . HLD (hyperlipidemia) 03/16/2014  . H/O coronary artery bypass surgery 05/12/2012  . Alcohol withdrawal syndrome ([place]) 04/28/2012  . Injury of kidney 04/25/2012  . Obstructive apnea 04/22/2012  . BP (high blood pressure) 04/22/2012  . Arteriosclerosis of coronary artery 04/22/2012  . Apnea, sleep 04/21/2012  . Chronic obstructive pulmonary disease ([place]) 04/21/2012    Past Surgical History:  Procedure Laterality Date  . cardiac bypass    . CARDIAC CATHETERIZATION Left 02/03/2016   Procedure: Left Heart Cath and Coronary Angiography;  Surgeon: Teodoro Spray, MD;  Location: [facility];  Service: Cardiovascular;  Laterality: Left;  . CARDIAC SURGERY    . COLONOSCOPY WITH PROPOFOL N/A 04/29/2015   Procedure: COLONOSCOPY WITH PROPOFOL;  Surgeon: Hulen Luster, MD;  Location: [facility] ENDOSCOPY;  Service: Gastroenterology;  Laterality: N/A;  . CORONARY ANGIOPLASTY WITH STENT PLACEMENT     x2  . CORONARY ARTERY BYPASS GRAFT    . ESOPHAGOGASTRODUODENOSCOPY (EGD) WITH PROPOFOL N/A 04/29/2015  Procedure: ESOPHAGOGASTRODUODENOSCOPY (EGD) WITH PROPOFOL;  Surgeon: Hulen Luster, MD;  Location: [facility] ENDOSCOPY;  Service: Gastroenterology;  Laterality: N/A;  . SEPTOPLASTY      Current Outpatient Rx  . Order #:  [id number] Class: Historical Med  . Order #: [id number] Class: Historical Med  . Order #: [id number] Class: Historical Med  . Order #: [id number] Class: Historical Med  . Order #: [id number] Class: Historical Med  . Order #: [id number] Class: Historical Med  . Order #: [id number] Class: Historical Med  . Order #: [id number] Class: Historical Med  . Order #: [id number] Class: Print  . [START ON 06/20/2016] Order #: [id number] Class: Print  . [START ON 07/20/2016] Order #: [id number] Class: Print  . Order #: [id number] Class: Historical Med  . Order #: [id number] Class: Historical Med  . Order #: [id number] Class: Historical Med  . Order #: [id number] Class: Historical Med  . Order #: [id number] Class: Historical Med  . Order #: [id number] Class: Historical Med  . Order #: [id number] Class: Historical Med  . Order #: [id number] Class: Historical Med  . Order #: [id number] Class: Historical Med    Allergies Review of patient's allergies indicates no known allergies.  Family History  Problem Relation Age of Onset  . Heart disease Mother   . Hypertension Father     Social History Social History  Substance Use Topics  . Smoking status: Former Smoker    Years: 25.00  . Smokeless tobacco: Former Systems developer  . Alcohol use Yes     Comment: occasional    Review of Systems Constitutional: Positive fever. No chills. Negative lightheadedness without syncope. Eyes: No visual changes. No eye discharge.  ENT: Positive sore throat. Positive congestion and rhinorrhea. Cardiovascular: Denies chest pain. Denies palpitations. Respiratory: Positive exertional shortness of breath.  Positive productive cough. Gastrointestinal: No abdominal pain.  No nausea, no vomiting.  No diarrhea.  No constipation. Genitourinary: Negative for dysuria. Musculoskeletal: Negative for back pain. Skin: Positive for rash on the back since surgery. Neurological: Negative for headaches. No focal numbness, tingling or weakness.   10-point ROS otherwise  negative.  ____________________________________________   PHYSICAL EXAM:  VITAL SIGNS: ED Triage Vitals  Enc Vitals Group     BP 05/28/16 0417 (!) 156/84     Pulse Rate 05/28/16 0417 (!) 107     Resp 05/28/16 0417 (!) 24     Temp 05/28/16 0417 100 F (37.8 C)     Temp src --      SpO2 05/28/16 0417 94 %     Weight 05/28/16 0417 154 lb (69.9 kg)     Height 05/28/16 0417 5\' 3"  (1.6 m)     Head Circumference --      Peak Flow --      Pain Score 05/28/16 0418 7     Pain Loc --      Pain Edu? --      Excl. in [place]? --     Constitutional: Alert and oriented. Chronically ill appearing but nontoxic Answers questions appropriately. Eyes: Conjunctivae are normal.  EOMI. No scleral icterus. No eye discharge. Head: Atraumatic. Nose: No congestion/rhinnorhea. Mouth/Throat: Mucous membranes are dry. Positive mild erythema of the posterior pharynx without tonsillar swelling or exudate. Posterior palate is symmetric and the uvula is midline. There is no stridor or drooling. Neck: No stridor.  Supple.   Cardiovascular: Fast rate, regular rhythm. No murmurs, rubs or gallops.  Respiratory: Normal respiratory effort.  No accessory muscle use or retractions. Rales in the left lung base. No wheezes or rhonchi. Good air exchange.  Gastrointestinal: Soft, nontender and nondistended.  No guarding or rebound.  No peritoneal signs. Musculoskeletal: No LE edema. No ttp in the calves or palpable cords.  Negative Homan's sign. Neurologic:  A&Ox3.  Speech is clear.  Face and smile are symmetric.  EOMI.  Moves all extremities well. Skin:  3 x 4" superficial abrasion on the mid back overlying the central spine without significant swelling or any discharge. Psychiatric: Normal mood with flat affect. Speech and behavior are normal.  Normal judgement.  ____________________________________________   LABS (all labs ordered are listed, but only abnormal results are displayed)  Labs Reviewed  BASIC METABOLIC  PANEL - Abnormal; Notable for the following:       Result Value   Chloride 99 (*)    Glucose, Bld 138 (*)    All other components within normal limits  CBC - Abnormal; Notable for the following:    RDW 15.5 (*)    All other components within normal limits  CULTURE, BLOOD (ROUTINE X 2)  CULTURE, BLOOD (ROUTINE X 2)  URINE CULTURE  TROPONIN I  LACTIC ACID, PLASMA  LACTIC ACID, PLASMA  BLOOD GAS, VENOUS  URINALYSIS COMPLETEWITH MICROSCOPIC ([facility] ONLY)   ____________________________________________  EKG  ED ECG REPORT I, Eula Listen, the attending physician, personally viewed and interpreted this ECG.   Date: 05/28/2016  EKG Time: 437  Rate: 104  Rhythm: sinus tachycardia, RBBB  Axis: leftward   Intervals:none  ST&T Change: T wave inversions in V1, V2, and V3. No ST elevation.  The morphology of the patient's T wave inversions and right bundle-branch block are new compared to previous EKG, 03/21/16. ____________________________________________  RADIOLOGY  Dg Chest 2 View  Result Date: 05/28/2016 CLINICAL DATA:  60 year old male with cough and fever and shortness of breath EXAM: CHEST  2 VIEW COMPARISON:  Chest CT dated 03/1916 FINDINGS: Two views of the chest demonstrate postsurgical changes and scarring in the left lower lobe. An area of hazy density over the left cardiac border may be postsurgical changes, however; developing infiltrate is not entirely excluded. The right lung is clear. There is no pleural effusion or pneumothorax. The cardiac silhouette is within normal limits. Multiple coronary vascular surgical clips noted. There has been interval removal of the previously seen median sternotomy wires and placement of external fixation hardware. Multiple surgical clips noted over the right upper lung field which I perineum compared the prior CT. No acute osseous pathology. IMPRESSION: Left lung base scarring. Postsurgical changes versus less likely developing  infiltrate in the lingula. Postsurgical changes of CABG. Electronically Signed   By: Anner Crete M.D.   On: 05/28/2016 05:23    ____________________________________________   PROCEDURES  Procedure(s) performed: None  Procedures  Critical Care performed: Yes, see critical care note(s) ____________________________________________   INITIAL IMPRESSION / ASSESSMENT AND PLAN / ED COURSE  Pertinent labs & imaging results that were available during my care of the patient were reviewed by me and considered in my medical decision making (see chart for details).  60 y.o. male S/P CABG 7/17 presenting with fever, cough that is productive, and arriving to the emergency department with fever, tachypnea, oxygen saturations in the low to mid 90s, and sinus tachycardia. Overall, the patient is nontoxic but I am concerned that he has hospital-acquired pneumonia with early sepsis. I have initiated a code sepsis, and he will receive empiric anabiotic's as well as intravenous fluids. I will also get a strep test, blood cultures and urine culture.  The patient will require admission to the hospital.  CRITICAL  CARE Performed by: Eula Listen   Total critical care time: 35 minutes  Critical care time was exclusive of separately billable procedures and treating other patients.  Critical care was necessary to treat or prevent imminent or life-threatening deterioration.  Critical care was time spent personally by me on the following activities: development of treatment plan with patient and/or surrogate as well as nursing, discussions with consultants, evaluation of patient's response to treatment, examination of patient, obtaining history from patient or surrogate, ordering and performing treatments and interventions, ordering and review of laboratory studies, ordering and review of radiographic studies, pulse oximetry and re-evaluation of patient's  condition.    ____________________________________________  FINAL CLINICAL IMPRESSION(S) / ED DIAGNOSES  Final diagnoses:  None    Clinical Course      NEW MEDICATIONS STARTED DURING THIS VISIT:  New Prescriptions   No medications on file      Eula Listen, MD 05/28/16 [phone]

## 2016-05-28 NOTE — ED Notes (Signed)
Pt resting quietly in the lobby; eyes closed, resp even and unlabored 

## 2016-05-28 NOTE — ED Notes (Signed)
Yellow fall risk band placed on pt's right wrist; pt understands to call for help if he needs any assistance getting up for any reason

## 2016-05-29 LAB — BASIC METABOLIC PANEL
Anion gap: 11 (ref 5–15)
BUN: 12 mg/dL (ref 6–20)
CO2: 24 mmol/L (ref 22–32)
CREATININE: 0.69 mg/dL (ref 0.61–1.24)
Calcium: 8.9 mg/dL (ref 8.9–10.3)
Chloride: 104 mmol/L (ref 101–111)
GFR calc Af Amer: >60 mL/min (ref >60)
Glucose, Bld: 103 mg/dL — ABNORMAL HIGH (ref 65–99)
POTASSIUM: 3.4 mmol/L — AB (ref 3.5–5.1)
SODIUM: 139 mmol/L (ref 135–145)

## 2016-05-29 LAB — URINE CULTURE: Culture: NO GROWTH

## 2016-05-29 LAB — CBC
HCT: 36.8 % — ABNORMAL LOW (ref 40.0–52.0)
Hemoglobin: 12.7 g/dL — ABNORMAL LOW (ref 13.0–18.0)
MCH: 30.1 pg (ref 26.0–34.0)
MCHC: 34.4 g/dL (ref 32.0–36.0)
MCV: 87.6 fL (ref 80.0–100.0)
PLATELETS: 143 10*3/uL — AB (ref 150–440)
RBC: 4.2 MIL/uL — ABNORMAL LOW (ref 4.40–5.90)
RDW: 15.5 % — AB (ref 11.5–14.5)
WBC: 5.3 10*3/uL (ref 3.8–10.6)

## 2016-05-29 MED ORDER — POTASSIUM CHLORIDE CRYS ER 20 MEQ PO TBCR: 20.0000 meq | EXTENDED_RELEASE_TABLET | Freq: Once | ORAL | Status: DC

## 2016-05-29 MED ORDER — CEFUROXIME AXETIL 500 MG PO TABS: 500.0000 mg | ORAL_TABLET | Freq: Two times a day (BID) | ORAL | 0 refills | Status: DC

## 2016-05-29 MED ORDER — AZITHROMYCIN 500 MG PO TABS: 500.0000 mg | ORAL_TABLET | Freq: Every day | ORAL | 0 refills | Status: DC

## 2016-05-29 NOTE — Discharge Summary (Signed)
[facility] [place] NAME: [name] [name]    MR#:  [id number]  DATE OF BIRTH:  Oct 22, 1955  DATE OF ADMISSION:  05/28/2016   ADMITTING PHYSICIAN: Gladstone Lighter, MD  DATE OF DISCHARGE: 05/29/2016  1:02 PM  PRIMARY CARE PHYSICIAN: SPENCER, Walker, NP   ADMISSION DIAGNOSIS:   Hospital-acquired pneumonia [J18.9] Sepsis, due to unspecified organism ([place]) [A41.9]  DISCHARGE DIAGNOSIS:   Active Problems:   Pneumonia   Pressure injury of skin   SECONDARY DIAGNOSIS:   Past Medical History:  Diagnosis Date  . Benign esophageal stricture   . Cardiomyopathy, secondary ([place])   . Coronary artery disease   . Diverticulitis   . Gout   . Hemorrhoids   . Hyperlipidemia   . Hypertension   . Sleep apnea   . Tubular adenoma of colon     HOSPITAL COURSE:   [name] [name]  is a 60 y.o. male with a known history of CAD status post redo CABG in July 2017, cardiomyopathy with last EF of 45%, hypertension, esophageal stricture presents to the hospital secondary to worsening shortness of breath and productive cough.  #1 community-acquired pneumonia-blood cultures are negative. -Received Rocephin and azithromycin in the hospital- discharge on ceftin and azithromycin -did not need o2 support - incentive spirometry  #2 CAD-status post CABG in 2013 and redo CABG in July 2017 -Follow-up with [facility] as needed. Cardiac rehabilitation. -Continue cardiac medications including aspirin, statin, Imdur and metoprolol.  #3 Hypertention-continue Imdur and metoprolol. Hold losartan, HCTZ and Norvasc until blood pressure is stable and low at discharge and in the hospital as well. Advised to f/u with PCP in 1 week and also check BP at home.  #4 neuropathy/chronic pain-continue gabapentin and Norco when necessary  Stable for discharge today  DISCHARGE CONDITIONS:   Stable  CONSULTS OBTAINED:   None  DRUG ALLERGIES:   No Known Allergies DISCHARGE  MEDICATIONS:     Medication List    STOP taking these medications   amLODipine 10 MG tablet Commonly known as:  NORVASC   hydrochlorothiazide 25 MG tablet Commonly known as:  HYDRODIURIL   losartan 100 MG tablet Commonly known as:  COZAAR     TAKE these medications   allopurinol 300 MG tablet Commonly known as:  ZYLOPRIM Take 300 mg by mouth daily. *patient takes in the afternoon*   aspirin EC 81 MG tablet Take 81 mg by mouth every morning.   atorvastatin 40 MG tablet Commonly known as:  LIPITOR Take 40 mg by mouth at bedtime.   azithromycin 500 MG tablet Commonly known as:  ZITHROMAX Take 1 tablet (500 mg total) by mouth daily. X 3 more days starting 05/30/16 Start taking on:  05/30/2016   cefUROXime 500 MG tablet Commonly known as:  CEFTIN Take 1 tablet (500 mg total) by mouth 2 (two) times daily with a meal. X 5 more days   [employer] LANCING DEVICE Misc Use 1 each 3 (three) times daily. Product selection permitted according to insurance preference. E11.9 Type 2 diabetes mellitus   FIFTY50 GLUCOSE METER 2.0 w/Device Kit Use as directed. Product selection permitted according to insurance preference. E11.9 Type 2 diabetes mellitus   gabapentin 600 MG tablet Commonly known as:  NEURONTIN Take 600 mg by mouth 2 (two) times daily.   HYDROcodone-acetaminophen 5-325 MG tablet Commonly known as:  NORCO/VICODIN Take 1 tablet by mouth 2 (two) times daily as needed for severe pain. What changed:  Another medication with the  same name was removed. Continue taking this medication, and follow the directions you see here.   isosorbide mononitrate 30 MG 24 hr tablet Commonly known as:  IMDUR Take 30 mg by mouth every morning.   metoprolol 50 MG tablet Commonly known as:  LOPRESSOR Take 50 mg by mouth 3 (three) times daily.   multivitamin with minerals Tabs tablet Take 1 tablet by mouth every evening.   nitroGLYCERIN 0.4 MG SL tablet Commonly known as:  NITROSTAT Place 0.4  mg under the tongue every 5 (five) minutes x 3 doses as needed for chest pain.   Potassium 95 MG Tabs Take 95 mg by mouth every morning.   senna 8.6 MG tablet Commonly known as:  SENOKOT Take 1 tablet by mouth at bedtime.   Vitamin D3 2000 units Tabs Take 2,000 Units by mouth every morning.        DISCHARGE INSTRUCTIONS:   1. PCP f/u in 1 week 2. Cardiology f/u in 3 weeks  DIET:   Cardiac diet  ACTIVITY:   Activity as tolerated  OXYGEN:   Home Oxygen: No.  Oxygen Delivery: room air  DISCHARGE LOCATION:   home   If you experience worsening of your admission symptoms, develop shortness of breath, life threatening emergency, suicidal or homicidal thoughts you must seek medical attention immediately by calling 911 or calling your MD immediately  if symptoms less severe.  You Must read complete instructions/literature along with all the possible adverse reactions/side effects for all the Medicines you take and that have been prescribed to you. Take any new Medicines after you have completely understood and accpet all the possible adverse reactions/side effects.   Please note  You were cared for by a hospitalist during your hospital stay. If you have any questions about your discharge medications or the care you received while you were in the hospital after you are discharged, you can call the unit and asked to speak with the hospitalist on call if the hospitalist that took care of you is not available. Once you are discharged, your primary care physician will handle any further medical issues. Please note that NO REFILLS for any discharge medications will be authorized once you are discharged, as it is imperative that you return to your primary care physician (or establish a relationship with a primary care physician if you do not have one) for your aftercare needs so that they can reassess your need for medications and monitor your lab values.    On the day of Discharge:    VITAL SIGNS:   Blood pressure 103/60, pulse 87, temperature 98.7 F (37.1 C), temperature source Oral, resp. rate 18, height '5\' 3"'  (1.6 m), weight 69.9 kg (154 lb), SpO2 95 %.  PHYSICAL EXAMINATION:    GENERAL:  60 y.o.-year-old patient lying in the bed with no acute distress.  EYES: Pupils equal, round, reactive to light and accommodation. No scleral icterus. Extraocular muscles intact.  HEENT: Head atraumatic, normocephalic. Oropharynx and nasopharynx clear.  NECK:  Supple, no jugular venous distention. No thyroid enlargement, no tenderness.  LUNGS: Normal breath sounds bilaterally, no wheezing, rales,rhonchi or crepitation. No use of accessory muscles of respiration. Decreased bibasilar breath sounds  CARDIOVASCULAR: S1, S2 normal. No  rubs, or gallops. CABG scar noted. 3/6 systolic murmur present ABDOMEN: Soft, nontender, nondistended. Bowel sounds present. No organomegaly or mass.  EXTREMITIES: No pedal edema, cyanosis, or clubbing.  NEUROLOGIC: Cranial nerves II through XII are intact. Muscle strength 5/5 in all extremities. Sensation intact.  Gait not checked.  PSYCHIATRIC: The patient is alert and oriented x 3.  SKIN: No obvious rash, lesion, or ulcer.   DATA REVIEW:   CBC  Recent Labs Lab 05/29/16 0408  WBC 5.3  HGB 12.7*  HCT 36.8*  PLT 143*    Chemistries   Recent Labs Lab 05/29/16 0408  NA 139  K 3.4*  CL 104  CO2 24  GLUCOSE 103*  BUN 12  CREATININE 0.69  CALCIUM 8.9     Microbiology Results  Results for orders placed or performed during the hospital encounter of 05/28/16  Blood Culture (routine x 2)     Status: None (Preliminary result)   Collection Time: 05/28/16  7:56 AM  Result Value Ref Range Status   Specimen Description BLOOD LEFT ARM  Final   Special Requests BOTTLES DRAWN AEROBIC AND ANAEROBIC 5CC  Final   Culture NO GROWTH 1 DAY  Final   Report Status PENDING  Incomplete  Blood Culture (routine x 2)     Status: None (Preliminary  result)   Collection Time: 05/28/16  7:57 AM  Result Value Ref Range Status   Specimen Description BLOOD RIGHT ARM  Final   Special Requests BOTTLES DRAWN AEROBIC AND ANAEROBIC  1CC  Final   Culture NO GROWTH 1 DAY  Final   Report Status PENDING  Incomplete  Urine culture     Status: None   Collection Time: 05/28/16 11:11 AM  Result Value Ref Range Status   Specimen Description URINE, RANDOM  Final   Special Requests NONE  Final   Culture NO GROWTH Performed at [facility]   Final   Report Status 05/29/2016 FINAL  Final    RADIOLOGY:  No results found.   Management plans discussed with the patient, family and they are in agreement.  CODE STATUS:     Code Status Orders        Start     Ordered   05/28/16 0923  Full code  Continuous     05/28/16 0922    Code Status History    Date Active Date Inactive Code Status Order ID Comments User Context   02/29/2016  4:06 AM 02/29/2016  8:08 PM Full Code [id number]  Saundra Shelling, MD Inpatient   02/24/2016  5:54 PM 02/25/2016  6:04 PM Full Code [id number]  Demetrios Loll, MD Inpatient    Advance Directive Documentation   Flowsheet Row Most Recent Value  Type of Advance Directive  Healthcare Power of Attorney, Living will  Pre-existing out of facility DNR order (yellow form or pink MOST form)  No data  "MOST" Form in Place?  No data      TOTAL TIME TAKING CARE OF THIS PATIENT: 37 minutes.    Gladstone Lighter M.D on 05/29/2016 at 2:26 PM  Between 7am to 6pm - Pager - [phone]  After 6pm go to [email or web address] - Proofreader  [facility]  Office  [phone]  CC: Primary care physician; Elisabeth Cara, NP   Note: This dictation was prepared with Dragon dictation along with smaller phrase technology. Any transcriptional errors that result from this process are unintentional.

## 2016-05-29 NOTE — Care Management Note (Signed)
Case Management Note  Patient Details  Name: [name] [name]. MRN: [id number] Date of Birth: March 17, 1956  Subjective/Objective:   RNCM assessment for discharge planning. Met with patient at bedside. He lives alone. Uses a walker. Drives and is independent otherwise. Provides his own ADL's. No home health or home O2. PCP is at [facility] He uses [address]. No needs identified.                   Action/Plan:   Expected Discharge Date:                  Expected Discharge Plan:  Home/Self Care  In-House Referral:     Discharge planning Services  CM Consult, Homebound not met per provider  Post Acute Care Choice:  NA Choice offered to:     DME Arranged:    DME Agency:     HH Arranged:    HH Agency:     Status of Service:  Completed, signed off  If discussed at [employer] of Stay Meetings, dates discussed:    Additional Comments:  Jolly Mango, RN 05/29/2016, 12:03 PM

## 2016-05-29 NOTE — Progress Notes (Signed)
Discharge instructions given with verbalized understanding.  Patient taken to ER waiting area where patient will drive self home in personal vehicle.

## 2016-05-29 NOTE — Progress Notes (Signed)
This was a routine visit on the floor where I saw Pt who was about to be discharged. Ch spent very little time with the patient to provide support and presence.     05/29/16 1200  Clinical Encounter Type  Visited With Patient  Visit Type Spiritual support  Spiritual Encounters  Spiritual Needs Prayer

## 2016-06-02 LAB — CULTURE, BLOOD (ROUTINE X 2)
CULTURE: NO GROWTH
CULTURE: NO GROWTH

## 2016-08-13 ENCOUNTER — Encounter: Payer: Medicare Other | Admitting: Pain Medicine

## 2016-08-16 ENCOUNTER — Ambulatory Visit: Payer: Medicare Other | Attending: Pain Medicine | Admitting: Pain Medicine

## 2016-08-16 ENCOUNTER — Encounter: Payer: Self-pay | Admitting: Pain Medicine

## 2016-08-16 VITALS — BP 127/66 | HR 68 | Temp 97.8°F | Resp 16 | Ht 63.0 in | Wt 165.0 lb

## 2016-08-16 DIAGNOSIS — F1190 Opioid use, unspecified, uncomplicated: Secondary | ICD-10-CM | POA: Diagnosis not present

## 2016-08-16 DIAGNOSIS — M792 Neuralgia and neuritis, unspecified: Secondary | ICD-10-CM | POA: Diagnosis not present

## 2016-08-16 DIAGNOSIS — T402X5A Adverse effect of other opioids, initial encounter: Secondary | ICD-10-CM

## 2016-08-16 DIAGNOSIS — E559 Vitamin D deficiency, unspecified: Secondary | ICD-10-CM | POA: Insufficient documentation

## 2016-08-16 DIAGNOSIS — L910 Hypertrophic scar: Secondary | ICD-10-CM | POA: Diagnosis not present

## 2016-08-16 DIAGNOSIS — M79604 Pain in right leg: Secondary | ICD-10-CM | POA: Insufficient documentation

## 2016-08-16 DIAGNOSIS — I10 Essential (primary) hypertension: Secondary | ICD-10-CM | POA: Insufficient documentation

## 2016-08-16 DIAGNOSIS — F10239 Alcohol dependence with withdrawal, unspecified: Secondary | ICD-10-CM | POA: Diagnosis not present

## 2016-08-16 DIAGNOSIS — R700 Elevated erythrocyte sedimentation rate: Secondary | ICD-10-CM | POA: Diagnosis not present

## 2016-08-16 DIAGNOSIS — I25110 Atherosclerotic heart disease of native coronary artery with unstable angina pectoris: Secondary | ICD-10-CM | POA: Diagnosis not present

## 2016-08-16 DIAGNOSIS — I252 Old myocardial infarction: Secondary | ICD-10-CM | POA: Diagnosis not present

## 2016-08-16 DIAGNOSIS — R0789 Other chest pain: Secondary | ICD-10-CM | POA: Insufficient documentation

## 2016-08-16 DIAGNOSIS — J449 Chronic obstructive pulmonary disease, unspecified: Secondary | ICD-10-CM | POA: Diagnosis not present

## 2016-08-16 DIAGNOSIS — M109 Gout, unspecified: Secondary | ICD-10-CM | POA: Insufficient documentation

## 2016-08-16 DIAGNOSIS — M25511 Pain in right shoulder: Secondary | ICD-10-CM

## 2016-08-16 DIAGNOSIS — G4733 Obstructive sleep apnea (adult) (pediatric): Secondary | ICD-10-CM | POA: Insufficient documentation

## 2016-08-16 DIAGNOSIS — Z79891 Long term (current) use of opiate analgesic: Secondary | ICD-10-CM | POA: Diagnosis not present

## 2016-08-16 DIAGNOSIS — Z9889 Other specified postprocedural states: Secondary | ICD-10-CM | POA: Insufficient documentation

## 2016-08-16 DIAGNOSIS — G8929 Other chronic pain: Secondary | ICD-10-CM | POA: Diagnosis not present

## 2016-08-16 DIAGNOSIS — E876 Hypokalemia: Secondary | ICD-10-CM | POA: Diagnosis not present

## 2016-08-16 DIAGNOSIS — M533 Sacrococcygeal disorders, not elsewhere classified: Secondary | ICD-10-CM | POA: Insufficient documentation

## 2016-08-16 DIAGNOSIS — M940 Chondrocostal junction syndrome [Tietze]: Secondary | ICD-10-CM | POA: Insufficient documentation

## 2016-08-16 DIAGNOSIS — G894 Chronic pain syndrome: Secondary | ICD-10-CM | POA: Insufficient documentation

## 2016-08-16 DIAGNOSIS — Z7982 Long term (current) use of aspirin: Secondary | ICD-10-CM | POA: Insufficient documentation

## 2016-08-16 DIAGNOSIS — I2510 Atherosclerotic heart disease of native coronary artery without angina pectoris: Secondary | ICD-10-CM | POA: Insufficient documentation

## 2016-08-16 DIAGNOSIS — K649 Unspecified hemorrhoids: Secondary | ICD-10-CM | POA: Insufficient documentation

## 2016-08-16 DIAGNOSIS — M545 Low back pain: Secondary | ICD-10-CM | POA: Insufficient documentation

## 2016-08-16 DIAGNOSIS — M47816 Spondylosis without myelopathy or radiculopathy, lumbar region: Secondary | ICD-10-CM | POA: Insufficient documentation

## 2016-08-16 DIAGNOSIS — M79605 Pain in left leg: Secondary | ICD-10-CM | POA: Diagnosis not present

## 2016-08-16 DIAGNOSIS — I200 Unstable angina: Secondary | ICD-10-CM | POA: Diagnosis not present

## 2016-08-16 DIAGNOSIS — Z951 Presence of aortocoronary bypass graft: Secondary | ICD-10-CM | POA: Insufficient documentation

## 2016-08-16 DIAGNOSIS — E785 Hyperlipidemia, unspecified: Secondary | ICD-10-CM | POA: Diagnosis not present

## 2016-08-16 DIAGNOSIS — R079 Chest pain, unspecified: Secondary | ICD-10-CM | POA: Diagnosis present

## 2016-08-16 DIAGNOSIS — K5903 Drug induced constipation: Secondary | ICD-10-CM | POA: Insufficient documentation

## 2016-08-16 DIAGNOSIS — M25519 Pain in unspecified shoulder: Secondary | ICD-10-CM | POA: Diagnosis present

## 2016-08-16 DIAGNOSIS — Z87891 Personal history of nicotine dependence: Secondary | ICD-10-CM | POA: Insufficient documentation

## 2016-08-16 MED ORDER — HYDROCODONE-ACETAMINOPHEN 5-325 MG PO TABS: 1.0000 | ORAL_TABLET | Freq: Two times a day (BID) | ORAL | 0 refills | Status: DC | PRN

## 2016-08-16 MED ORDER — BISACODYL 5 MG PO TBEC: 10.0000 mg | DELAYED_RELEASE_TABLET | Freq: Every evening | ORAL | 99 refills | Status: DC | PRN

## 2016-08-16 MED ORDER — BENEFIBER PO POWD: ORAL | 99 refills | Status: DC

## 2016-08-16 MED ORDER — DOCUSATE SODIUM 100 MG PO CAPS: 200.0000 mg | ORAL_CAPSULE | Freq: Every evening | ORAL | 99 refills | Status: DC | PRN

## 2016-08-16 MED ORDER — GABAPENTIN 600 MG PO TABS: 600.0000 mg | ORAL_TABLET | Freq: Two times a day (BID) | ORAL | 0 refills | Status: DC

## 2016-08-16 NOTE — Progress Notes (Signed)
Nursing Pain Medication Assessment:  Safety precautions to be maintained throughout the outpatient stay will include: orient to surroundings, keep bed in low position, maintain call bell within reach at all times, provide assistance with transfer out of bed and ambulation.  Medication Inspection Compliance: Pill count conducted under aseptic conditions, in front of the patient. Neither the pills nor the bottle was removed from the patient's sight at any time. Once count was completed pills were immediately returned to the patient in their original bottle.  Medication: Hydrocodone/APAP Pill Count: 9 of 60 pills remain Bottle Appearance: Standard pharmacy container. Clearly labeled. Filled Date: 71 / 18 / 2017 Medication last intake: 08-16-16 at 0000

## 2016-08-16 NOTE — Progress Notes (Signed)
Patient's Name: [name] [name].  MRN: [id number]  Referring Provider: Elisabeth Cara, NP  DOB: 21-Sep-1955  PCP: Elisabeth Cara, NP  DOS: 08/16/2016  Note by: Kathlen Brunswick. Dossie Arbour, MD  Service setting: Ambulatory outpatient  Specialty: Interventional Pain Management  Location: [facility] (AMB) Pain Management Facility    Patient type: Established   Primary Reason(s) for Visit: Encounter for prescription drug management (Level of risk: moderate) CC: Chest Pain; Shoulder Pain; and Leg Pain (bilateral, lower)  HPI  Mr. Soward is a 60 y.o. year old, male patient, who comes today for a medication management evaluation. He has Apnea, sleep; H/O coronary artery bypass surgery; Obstructive apnea; BP (high blood pressure); HLD (hyperlipidemia); Arteriosclerosis of coronary artery; Chronic obstructive pulmonary disease ([place]); Alcohol withdrawal syndrome ([place]); Injury of kidney; Chronic low back pain (Bilateral) (R>L); Chronic shoulder pain (Location of Tertiary source of pain) (Right); Chronic hip pain (Left); History of alcoholism ([place]); Opiate use; Encounter for therapeutic drug level monitoring; Chronic chest wall pain (Location of Primary Source of Pain) (Incisional Midline) (since 04/22/2012); Chronic lower extremity pain (Location of Secondary source of pain) (Bilateral) (R>L); History of MI (myocardial infarction) (January 2014); Encounter for pain management planning; Chronic sacroiliac joint pain (Bilateral) (L>R); Neuropathic pain; Neurogenic pain; Incisional pain; Elevated sedimentation rate; Vitamin D insufficiency; Lumbar facet syndrome (Bilateral) (R>L); Lumbar spondylosis; Long term current use of opiate analgesic; Long term prescription opiate use; Keloid skin disorder; Costochondritis; Unstable angina ([place]); Chest pain; Brachial plexus injury, right; Carotid atherosclerosis; Hypoalbuminemia; Hypokalemia; Right sided weakness; S/P CABG (coronary artery bypass graft); Opioid-induced constipation (OIC);  Pneumonia; Pressure injury of skin; COPD (chronic obstructive pulmonary disease) ([place]); and Chronic pain syndrome on his problem list. His primarily concern today is the Chest Pain; Shoulder Pain; and Leg Pain (bilateral, lower)  Pain Assessment: Self-Reported Pain Score: 4 /10             Reported level is compatible with observation.       Pain Type: Chronic pain Pain Location: Shoulder Pain Orientation: Right Pain Descriptors / Indicators:  (excruciating) Pain Frequency: Intermittent  Mr. Rosander was last seen on 05/17/2016 for medication management. During today's appointment we reviewed Mr. Knobel chronic pain status, as well as his outpatient medication regimen.  The patient  reports that he does not use drugs. His body mass index is 29.23 kg/m.  Further details on both, my assessment(s), as well as the proposed treatment plan, please see below.  Controlled Substance Pharmacotherapy Assessment REMS (Risk Evaluation and Mitigation Strategy)  Analgesic:Hydrocodone/APAP 5/325 one tablet twice a day (10 mg per day) MME/day:10 mg/day.  Landis Martins, RN  08/16/2016  9:35 AM  Sign at close encounter Nursing Pain Medication Assessment:  Safety precautions to be maintained throughout the outpatient stay will include: orient to surroundings, keep bed in low position, maintain call bell within reach at all times, provide assistance with transfer out of bed and ambulation.  Medication Inspection Compliance: Pill count conducted under aseptic conditions, in front of the patient. Neither the pills nor the bottle was removed from the patient's sight at any time. Once count was completed pills were immediately returned to the patient in their original bottle.  Medication: Hydrocodone/APAP Pill Count: 9 of 60 pills remain Bottle Appearance: Standard pharmacy container. Clearly labeled. Filled Date: 13 / 18 / 2017 Medication last intake: 08-16-16 at 0000   Pharmacokinetics: Liberation  and absorption (onset of action): WNL Distribution (time to peak effect): WNL Metabolism and excretion (duration of action): WNL  Pharmacodynamics: Desired effects: Analgesia: Mr. Stankey reports >50% benefit. Functional ability: Patient reports that medication allows him to accomplish basic ADLs Clinically meaningful improvement in function (CMIF): Sustained CMIF goals met Perceived effectiveness: Described as relatively effective, allowing for increase in activities of daily living (ADL) Undesirable effects: Side-effects or Adverse reactions: None reported Monitoring: [place] PMP: Online review of the past 36-monthperiod conducted. Compliant with practice rules and regulations List of all UDS test(s) done:  Lab Results  Component Value Date   TOXASSSELUR FINAL 01/16/2016   TOXASSSELUR FINAL 12/23/2015   TOXASSSELUR FINAL 10/25/2015   Last UDS on record: Tox[employer] Select 13  Date Value Ref Range Status  01/16/2016 FINAL  Final    Comment:    ==================================================================== TOXASSURE SELECT 13 (MW) ==================================================================== Test                             Result       Flag       Units Drug Present and Declared for Prescription Verification   Hydrocodone                    700          EXPECTED   ng/mg creat   Hydromorphone                  168          EXPECTED   ng/mg creat   Dihydrocodeine                 43           EXPECTED   ng/mg creat   Norhydrocodone                 271          EXPECTED   ng/mg creat    Sources of hydrocodone include scheduled prescription    medications. Hydromorphone, dihydrocodeine and norhydrocodone are    expected metabolites of hydrocodone. Hydromorphone and    dihydrocodeine are also available as scheduled prescription    medications. ==================================================================== Test                      Result    Flag   Units      Ref  Range   Creatinine              120              mg/dL      >=20 ==================================================================== Declared Medications:  The flagging and interpretation on this report are based on the  following declared medications.  Unexpected results may arise from  inaccuracies in the declared medications.  **Note: The testing scope of this panel includes these medications:  Hydrocodone (Hydrocodone-Acetaminophen)  **Note: The testing scope of this panel does not include following  reported medications:  Acetaminophen (Hydrocodone-Acetaminophen)  Allopurinol  Amlodipine  Aspirin  Atorvastatin  Cholecalciferol  Gabapentin  Hydrochlorothiazide  Losartan (Losartan Potassium)  Meloxicam  Metoprolol  Multivitamin  Vitamin D2 (Ergocalciferol) ==================================================================== For clinical consultation, please call [phone] ====================================================================    UDS interpretation: Compliant          Medication Assessment Form: Reviewed. Patient indicates being compliant with therapy Treatment compliance: Compliant Risk Assessment Profile: Aberrant behavior: See prior evaluations. None observed or detected today Comorbid factors increasing risk of overdose: See prior notes. No additional risks detected today Risk of substance use  disorder (SUD): Low Opioid Risk Tool (ORT) Total Score: 0  Interpretation Table:  Score <3 = Low Risk for SUD  Score between 4-7 = Moderate Risk for SUD  Score >8 = High Risk for Opioid Abuse   Risk Mitigation Strategies:  Patient Counseling: Covered Patient-Prescriber Agreement (PPA): Present and active  Notification to other healthcare providers: Done  Pharmacologic Plan: No change in therapy, at this time  Laboratory Chemistry  Inflammation Markers Lab Results  Component Value Date   ESRSEDRATE 32 (H) 10/26/2015   CRP <0.5 10/26/2015   Renal  Function Lab Results  Component Value Date   BUN 12 05/29/2016   CREATININE 0.69 05/29/2016   GFRAA >60 05/29/2016   GFRNONAA >60 05/29/2016   Hepatic Function Lab Results  Component Value Date   AST 39 02/24/2016   ALT 65 (H) 02/24/2016   ALBUMIN 4.3 02/24/2016   Electrolytes Lab Results  Component Value Date   NA 139 05/29/2016   K 3.4 (L) 05/29/2016   CL 104 05/29/2016   CALCIUM 8.9 05/29/2016   MG 2.0 10/26/2015   Pain Modulating Vitamins Lab Results  Component Value Date   VD25OH 26.5 (L) 10/26/2015   VD125OH2TOT 30.8 10/26/2015   Coagulation Parameters Lab Results  Component Value Date   INR 0.97 02/09/2015   LABPROT 13.1 02/09/2015   APTT > 160.0 (HH) 04/21/2012   PLT 143 (L) 05/29/2016   Cardiovascular Lab Results  Component Value Date   BNP 255 (H) 08/01/2014   HGB 12.7 (L) 05/29/2016   HCT 36.8 (L) 05/29/2016   Note: Lab results reviewed.  Recent Diagnostic Imaging Review  Dg Chest 2 View  Result Date: 05/28/2016 CLINICAL DATA:  60 year old male with cough and fever and shortness of breath EXAM: CHEST  2 VIEW COMPARISON:  Chest CT dated 03/1916 FINDINGS: Two views of the chest demonstrate postsurgical changes and scarring in the left lower lobe. An area of hazy density over the left cardiac border may be postsurgical changes, however; developing infiltrate is not entirely excluded. The right lung is clear. There is no pleural effusion or pneumothorax. The cardiac silhouette is within normal limits. Multiple coronary vascular surgical clips noted. There has been interval removal of the previously seen median sternotomy wires and placement of external fixation hardware. Multiple surgical clips noted over the right upper lung field which I perineum compared the prior CT. No acute osseous pathology. IMPRESSION: Left lung base scarring. Postsurgical changes versus less likely developing infiltrate in the lingula. Postsurgical changes of CABG. Electronically  Signed   By: Anner Crete M.D.   On: 05/28/2016 05:23   Note: Imaging results reviewed.          Meds  The patient has a current medication list which includes the following prescription(s): allopurinol, amlodipine, aspirin ec, atorvastatin, bisacodyl, fifty50 glucose meter 2.0, vitamin d3, docusate sodium, gabapentin, hydrochlorothiazide, hydrocodone-acetaminophen, hydrocodone-acetaminophen, hydrocodone-acetaminophen, isosorbide mononitrate, cvs lancing device, losartan, metoprolol, multivitamin with minerals, nitroglycerin, potassium, and benefiber.  Current Outpatient Prescriptions on File Prior to Visit  Medication Sig  . allopurinol (ZYLOPRIM) 300 MG tablet Take 300 mg by mouth daily. *patient takes in the afternoon*  . aspirin EC 81 MG tablet Take 81 mg by mouth every morning.  Marland Kitchen atorvastatin (LIPITOR) 40 MG tablet Take 40 mg by mouth at bedtime.   . Blood Glucose Monitoring Suppl (FIFTY50 GLUCOSE METER 2.0) w/Device KIT Use as directed. Product selection permitted according to insurance preference. E11.9 Type 2 diabetes mellitus  . Cholecalciferol (VITAMIN D3)  2000 units TABS Take 2,000 Units by mouth every morning.  . isosorbide mononitrate (IMDUR) 30 MG 24 hr tablet Take 30 mg by mouth every morning.   Elmore Guise Devices ([employer] LANCING DEVICE) MISC Use 1 each 3 (three) times daily. Product selection permitted according to insurance preference. E11.9 Type 2 diabetes mellitus  . metoprolol (LOPRESSOR) 50 MG tablet Take 50 mg by mouth 3 (three) times daily.  . Multiple Vitamin (MULTIVITAMIN WITH MINERALS) TABS tablet Take 1 tablet by mouth every evening.  . nitroGLYCERIN (NITROSTAT) 0.4 MG SL tablet Place 0.4 mg under the tongue every 5 (five) minutes x 3 doses as needed for chest pain.   Marland Kitchen Potassium 95 MG TABS Take 95 mg by mouth every morning.    No current facility-administered medications on file prior to visit.    ROS  Constitutional: Denies any fever or chills Gastrointestinal:  No reported hemesis, hematochezia, vomiting, or acute GI distress Musculoskeletal: Denies any acute onset joint swelling, redness, loss of ROM, or weakness Neurological: No reported episodes of acute onset apraxia, aphasia, dysarthria, agnosia, amnesia, paralysis, loss of coordination, or loss of consciousness  Allergies  Mr. Wildey has No Known Allergies.  PFSH  Drug: Mr. Fraticelli  reports that he does not use drugs. Alcohol:  reports that he drinks alcohol. Tobacco:  reports that he has quit smoking. He quit after 25.00 years of use. He has quit using smokeless tobacco. Medical:  has a past medical history of Benign esophageal stricture; Cardiomyopathy, secondary ([place]); Coronary artery disease; Diverticulitis; Gout; Hemorrhoids; Hyperlipidemia; Hypertension; Sleep apnea; and Tubular adenoma of colon. Family: family history includes Heart disease in his mother; Hypertension in his father.  Past Surgical History:  Procedure Laterality Date  . cardiac bypass    . CARDIAC CATHETERIZATION Left 02/03/2016   Procedure: Left Heart Cath and Coronary Angiography;  Surgeon: Teodoro Spray, MD;  Location: [place] [facility];  Service: Cardiovascular;  Laterality: Left;  . CARDIAC SURGERY    . COLONOSCOPY WITH PROPOFOL N/A 04/29/2015   Procedure: COLONOSCOPY WITH PROPOFOL;  Surgeon: Hulen Luster, MD;  Location: [facility] END[facility];  Service: Gastroenterology;  Laterality: N/A;  . CORONARY ANGIOPLASTY WITH STENT PLACEMENT     x2  . CORONARY ARTERY BYPASS GRAFT    . ESOPHAGOGASTRODUODENOSCOPY (EGD) WITH PROPOFOL N/A 04/29/2015   Procedure: ESOPHAGOGASTRODUODENOSCOPY (EGD) WITH PROPOFOL;  Surgeon: Hulen Luster, MD;  Location: [facility] ENDOSCOPY;  Service: Gastroenterology;  Laterality: N/A;  . SEPTOPLASTY     Constitutional Exam  General appearance: Well nourished, well developed, and well hydrated. In no apparent acute distress Vitals:   08/16/16 0923  BP: 127/66  Pulse: 68  Resp: 16  Temp: 97.8 F (36.6 C)   TempSrc: Oral  SpO2: 96%  Weight: 165 lb (74.8 kg)  Height: '5\' 3"'  (1.6 m)   BMI Assessment: Estimated body mass index is 29.23 kg/m as calculated from the following:   Height as of this encounter: '5\' 3"'  (1.6 m).   Weight as of this encounter: 165 lb (74.8 kg).  BMI interpretation table: BMI level Category Range association with higher incidence of chronic pain  <18 kg/m2 Underweight   18.5-24.9 kg/m2 Ideal body weight   25-29.9 kg/m2 Overweight Increased incidence by 20%  30-34.9 kg/m2 Obese (Class I) Increased incidence by 68%  35-39.9 kg/m2 Severe obesity (Class II) Increased incidence by 136%  >40 kg/m2 Extreme obesity (Class III) Increased incidence by 254%   BMI Readings from Last 4 Encounters:  08/16/16 29.23 kg/m  05/28/16 27.28 kg/m  05/17/16 27.28 kg/m  04/23/16 24.89 kg/m   Wt Readings from Last 4 Encounters:  08/16/16 165 lb (74.8 kg)  05/28/16 154 lb (69.9 kg)  05/17/16 154 lb (69.9 kg)  04/23/16 140 lb 8 oz (63.7 kg)  Psych/Mental status: Alert, oriented x 3 (person, place, & time) Eyes: PERLA Respiratory: No evidence of acute respiratory distress  Cervical Spine Exam  Inspection: No masses, redness, or swelling Alignment: Symmetrical Functional ROM: Unrestricted ROM Stability: No instability detected Muscle strength & Tone: Functionally intact Sensory: Unimpaired Palpation: Non-contributory  Upper Extremity (UE) Exam    Side: Right upper extremity  Side: Left upper extremity  Inspection: No masses, redness, swelling, or asymmetry  Inspection: No masses, redness, swelling, or asymmetry  Functional ROM: Unrestricted ROM          Functional ROM: Unrestricted ROM          Muscle strength & Tone: Functionally intact  Muscle strength & Tone: Functionally intact  Sensory: Unimpaired  Sensory: Unimpaired  Palpation: Non-contributory  Palpation: Non-contributory   Thoracic Spine Exam  Inspection: No masses, redness, or swelling Alignment:  Symmetrical Functional ROM: Unrestricted ROM Stability: No instability detected Sensory: Unimpaired Muscle strength & Tone: Functionally intact Palpation: Non-contributory  Lumbar Spine Exam  Inspection: No masses, redness, or swelling Alignment: Symmetrical Functional ROM: Unrestricted ROM Stability: No instability detected Muscle strength & Tone: Functionally intact Sensory: Unimpaired Palpation: Non-contributory Provocative Tests: Lumbar Hyperextension and rotation test: evaluation deferred today       Patrick's Maneuver: evaluation deferred today              Gait & Posture Assessment  Ambulation: Unassisted Gait: Relatively normal for age and body habitus Posture: WNL   Lower Extremity Exam    Side: Right lower extremity  Side: Left lower extremity  Inspection: No masses, redness, swelling, or asymmetry  Inspection: No masses, redness, swelling, or asymmetry  Functional ROM: Unrestricted ROM          Functional ROM: Unrestricted ROM          Muscle strength & Tone: Functionally intact  Muscle strength & Tone: Functionally intact  Sensory: Unimpaired  Sensory: Unimpaired  Palpation: Non-contributory  Palpation: Non-contributory   Assessment  Primary Diagnosis & Pertinent Problem List: The primary encounter diagnosis was Chronic pain syndrome. Diagnoses of Chronic chest wall pain (Location of Primary Source of Pain) (Incisional Midline) (since 04/22/2012), Chronic lower extremity pain (Location of Secondary source of pain) (Bilateral) (R>L), Chronic shoulder pain (Location of Tertiary source of pain) (Right), Long term current use of opiate analgesic, Opiate use, Opioid-induced constipation (OIC), and Neurogenic pain were also pertinent to this visit.  Status Diagnosis   Stable  Stable  Stable 1. Chronic pain syndrome   2. Chronic chest wall pain (Location of Primary Source of Pain) (Incisional Midline) (since 04/22/2012)   3. Chronic lower extremity pain (Location of  Secondary source of pain) (Bilateral) (R>L)   4. Chronic shoulder pain (Location of Tertiary source of pain) (Right)   5. Long term current use of opiate analgesic   6. Opiate use   7. Opioid-induced constipation (OIC)   8. Neurogenic pain      Plan of Care  Pharmacotherapy (Medications Ordered): Meds ordered this encounter  Medications  . HYDROcodone-acetaminophen (NORCO/VICODIN) 5-325 MG tablet    Sig: Take 1 tablet by mouth 2 (two) times daily as needed for severe pain.    Dispense:  60 tablet    Refill:  0  Patient may have prescription filled one day early if pharmacy is closed on scheduled refill date. Do not fill until: 10/18/16 To last until: 11/17/16  . HYDROcodone-acetaminophen (NORCO/VICODIN) 5-325 MG tablet    Sig: Take 1 tablet by mouth 2 (two) times daily as needed for severe pain.    Dispense:  60 tablet    Refill:  0    Patient may have prescription filled one day early if pharmacy is closed on scheduled refill date. Do not fill until: 09/18/16 To last until: 10/18/16  . HYDROcodone-acetaminophen (NORCO/VICODIN) 5-325 MG tablet    Sig: Take 1 tablet by mouth 2 (two) times daily as needed for severe pain.    Dispense:  60 tablet    Refill:  0    Patient may have prescription filled one day early if pharmacy is closed on scheduled refill date. Do not fill until: 08/19/16 To last until: 09/18/16  . gabapentin (NEURONTIN) 600 MG tablet    Sig: Take 1 tablet (600 mg total) by mouth 2 (two) times daily.    Dispense:  180 tablet    Refill:  0    Do not add this medication to the electronic "Automatic Refill" notification system. Patient may have prescription filled one day early if pharmacy is closed on scheduled refill date.  . Wheat Dextrin (BENEFIBER) POWD    Sig: Stir 2 tsp. TID into 4-8 oz of any non-carbonated beverage or soft food (hot or cold)    Dispense:  500 g    Refill:  PRN    This is an OTC product. This prescription is to serve as a reminder to the  patient as to our preference.  . docusate sodium (COLACE) 100 MG capsule    Sig: Take 2 capsules (200 mg total) by mouth at bedtime as needed for moderate constipation. Do not use longer than 7 days.    Dispense:  60 capsule    Refill:  PRN    Do not place this medication, or any other prescription from our practice, on "Automatic Refill". Patient may have prescription filled one day early if pharmacy is closed on scheduled refill date.  . bisacodyl (DULCOLAX) 5 MG EC tablet    Sig: Take 2 tablets (10 mg total) by mouth at bedtime as needed for moderate constipation ((Hold for loose stool)).    Dispense:  100 tablet    Refill:  PRN    Do not place this medication, or any other prescription from our practice, on "Automatic Refill". Patient may have prescription filled one day early if pharmacy is closed on scheduled refill date.   New Prescriptions   BISACODYL (DULCOLAX) 5 MG EC TABLET    Take 2 tablets (10 mg total) by mouth at bedtime as needed for moderate constipation ((Hold for loose stool)).   DOCUSATE SODIUM (COLACE) 100 MG CAPSULE    Take 2 capsules (200 mg total) by mouth at bedtime as needed for moderate constipation. Do not use longer than 7 days.   WHEAT DEXTRIN (BENEFIBER) POWD    Stir 2 tsp. TID into 4-8 oz of any non-carbonated beverage or soft food (hot or cold)   Medications administered today: Mr. Mangrum had no medications administered during this visit. Lab-work, procedure(s), and/or referral(s): No orders of the defined types were placed in this encounter.  Imaging and/or referral(s): None  Interventional therapies: Planned, scheduled, and/or pending:   None at this time.    Considering:   Lumbar facet and sacroiliac joint radiofrequency ablation.  Palliative PRN treatment(s):   Diagnostic left intra-articular hip joint injection under fluoroscopic guidance, with or without sedation.    Provider-requested follow-up: Return in about 3 months (around 11/14/2016)  for Med-Mgmt.  Future Appointments Date Time Provider [place]  11/08/2016 9:30 AM Milinda Pointer, MD [facility] None   Primary Care Physician: Elisabeth Cara, NP Location: [facility] Outpatient [facility] Management Facility Note by: Beatriz Chancellor A. Dossie Arbour, M.D, DABA, DABAPM, DABPM, DABIPP, FIPP Date: 08/16/16; Time: 12:58 PM  Pain Score Disclaimer: We use the NRS-11 scale. This is a self-reported, subjective measurement of pain severity with only modest accuracy. It is used primarily to identify changes within a particular patient. It must be understood that outpatient pain scales are significantly less accurate that those used for research, where they can be applied under ideal controlled circumstances with minimal exposure to variables. In reality, the score is likely to be a combination of pain intensity and pain affect, where pain affect describes the degree of emotional arousal or changes in action readiness caused by the sensory experience of pain. Factors such as social and work situation, setting, emotional state, anxiety levels, expectation, and prior pain experience may influence pain perception and show large inter-individual differences that may also be affected by time variables.  Patient instructions provided during this appointment: Patient Instructions  Pain Management Discharge Instructions  General Discharge Instructions :  If you need to reach your doctor call: Monday-Friday 8:00 am - 4:00 pm at [phone] or toll free [phone].  After clinic hours [phone] to have operator reach doctor.  Bring all of your medication bottles to all your appointments in the pain clinic.  To cancel or reschedule your appointment with Pain Management please remember to call 24 hours in advance to avoid a fee.  Refer to the educational materials which you have been given on: General Risks, I had my Procedure. Discharge Instructions, Post Sedation.  Post Procedure  Instructions:  The drugs you were given will stay in your system until tomorrow, so for the next 24 hours you should not drive, make any legal decisions or drink any alcoholic beverages.  You may eat anything you prefer, but it is better to start with liquids then soups and crackers, and gradually work up to solid foods.  Please notify your doctor immediately if you have any unusual bleeding, trouble breathing or pain that is not related to your normal pain.  Depending on the type of procedure that was done, some parts of your body may feel week and/or numb.  This usually clears up by tonight or the next day.  Walk with the use of an assistive device or accompanied by an adult for the 24 hours.  You may use ice on the affected area for the first 24 hours.  Put ice in a Ziploc bag and cover with a towel and place against area 15 minutes on 15 minutes off.  You may switch to heat after 24 hours.

## 2016-08-16 NOTE — Patient Instructions (Signed)

## 2016-09-11 IMAGING — CR DG SI JOINTS 3+V
1 series · 4 of 4 positions shown · non-contrast
Comparison: Coronal and sagittal reconstructed images through the
lumbar spine and pelvis from an abdominal and pelvic CT scan March 20, 2015

CLINICAL DATA: Low back and bilateral hip pain for several years
without history of injury.

EXAM:
BILATERAL SACROILIAC JOINTS - 3+ VIEW

[Series 1: dg si joints · 0.14mm/px · 4 of 4 slices shown]
[im 1/4]
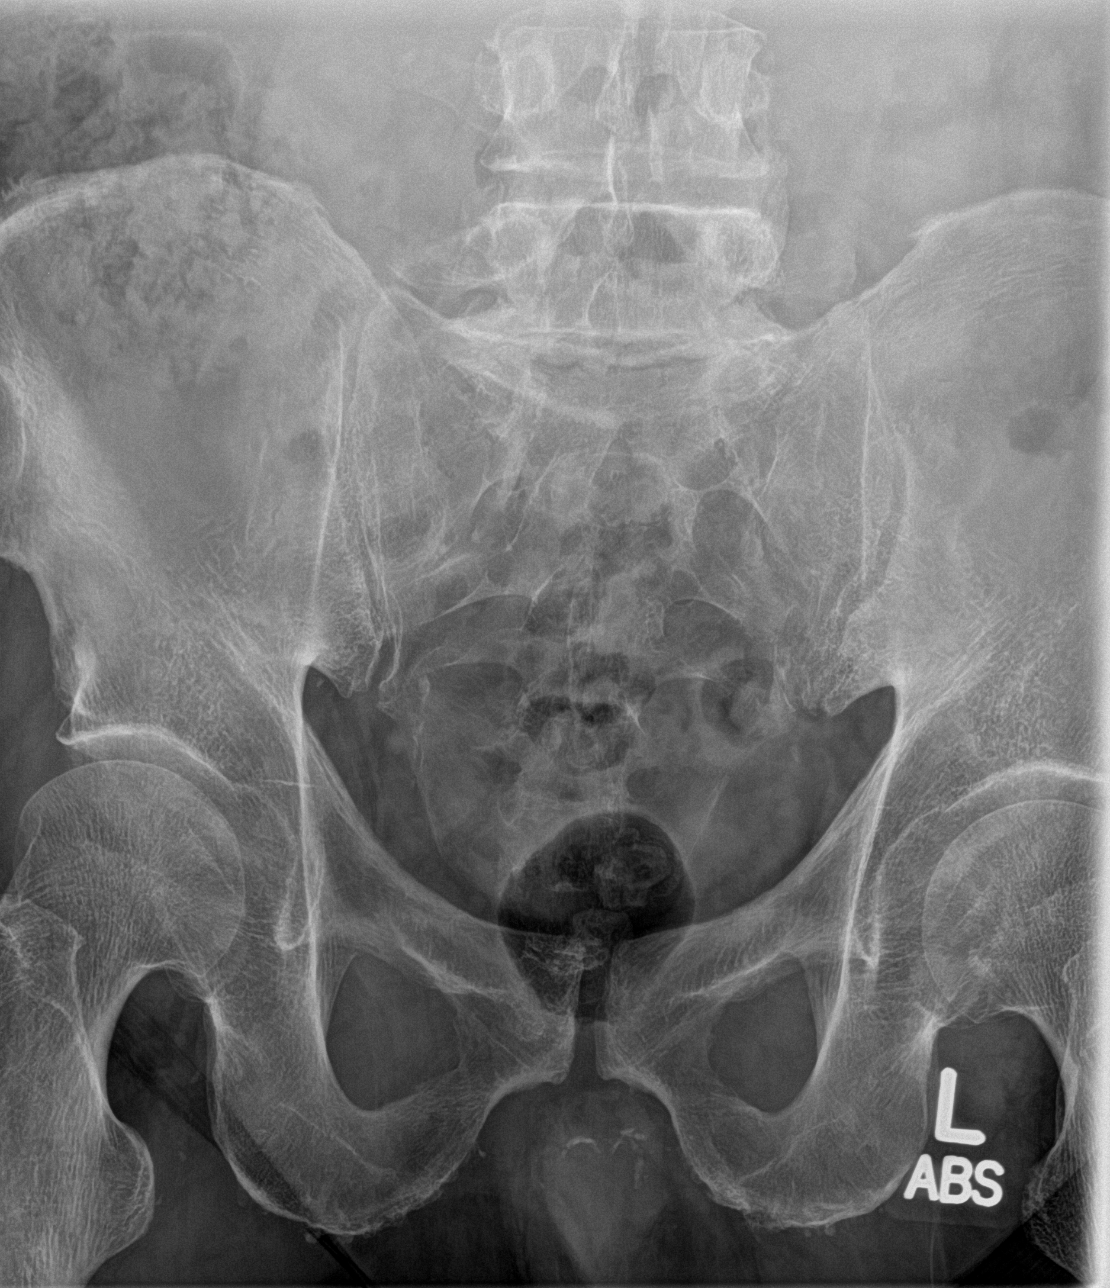
[im 2/4]
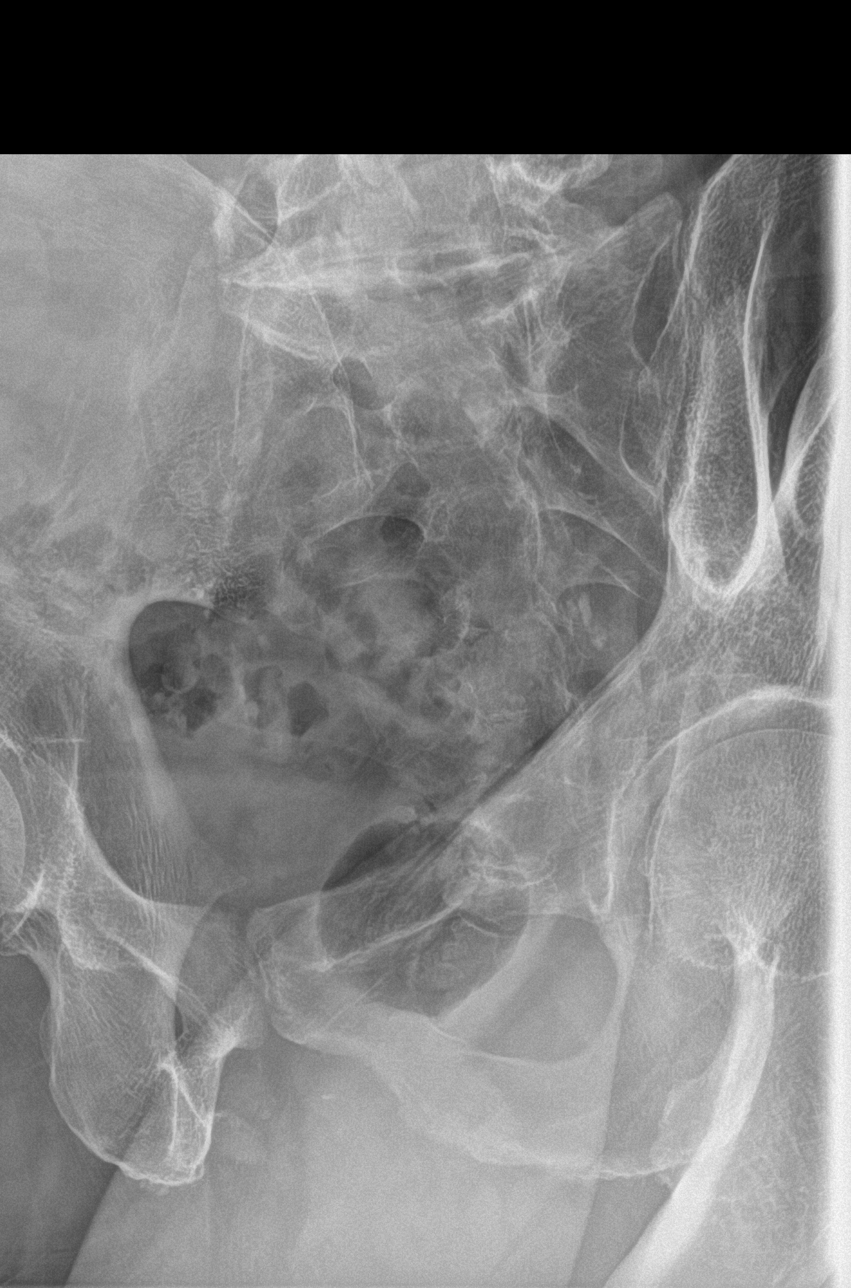
[im 3/4]
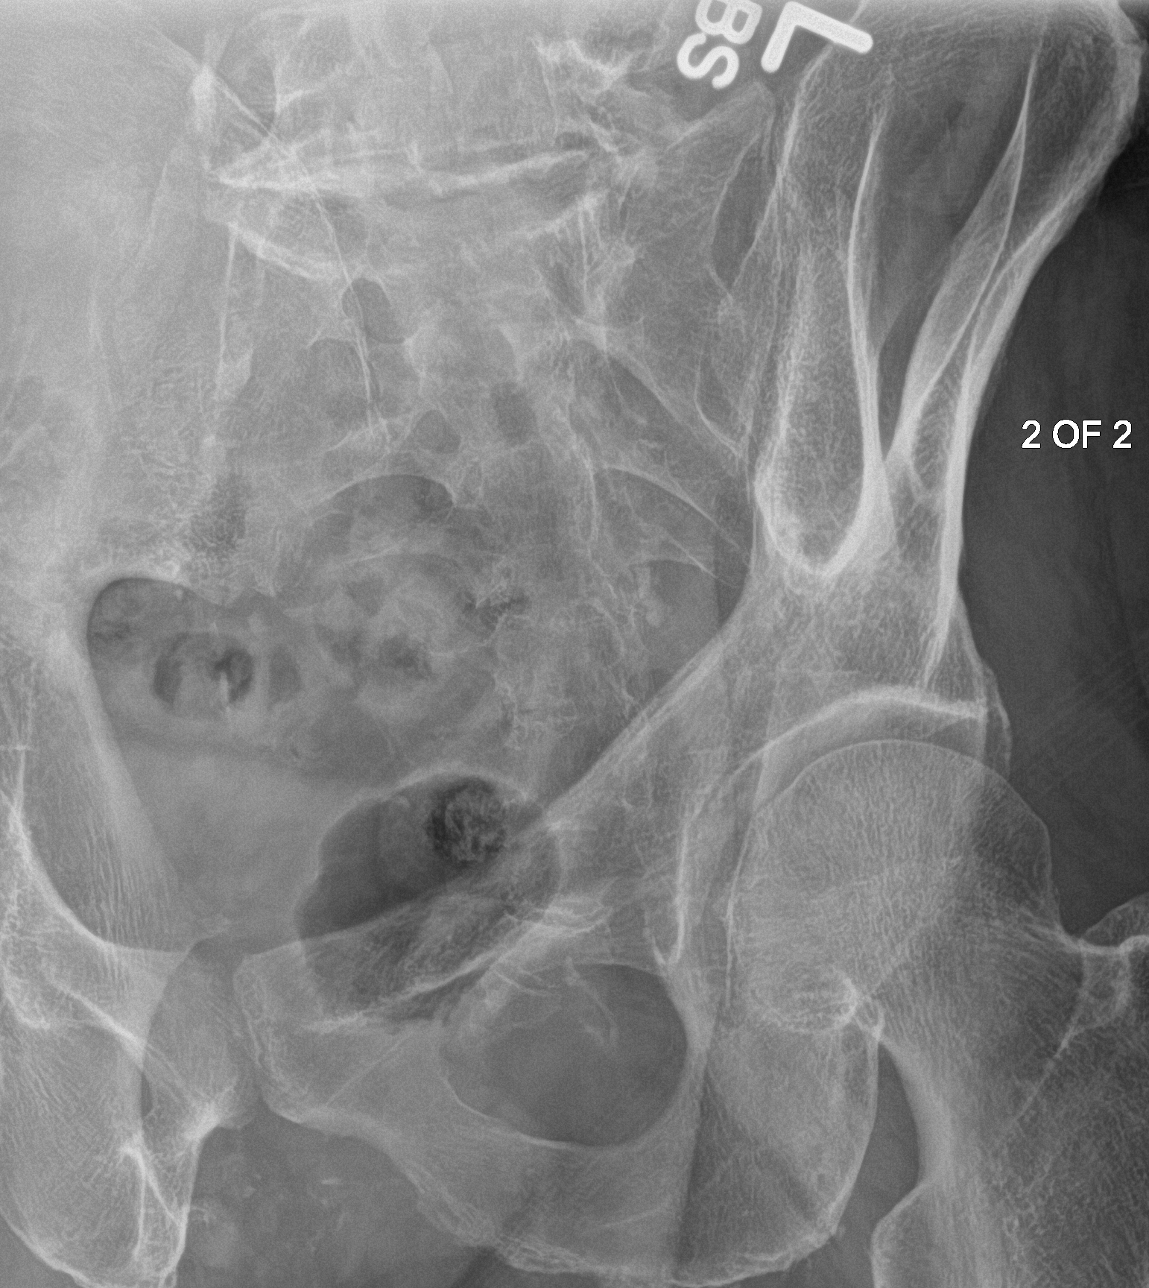
[im 4/4]
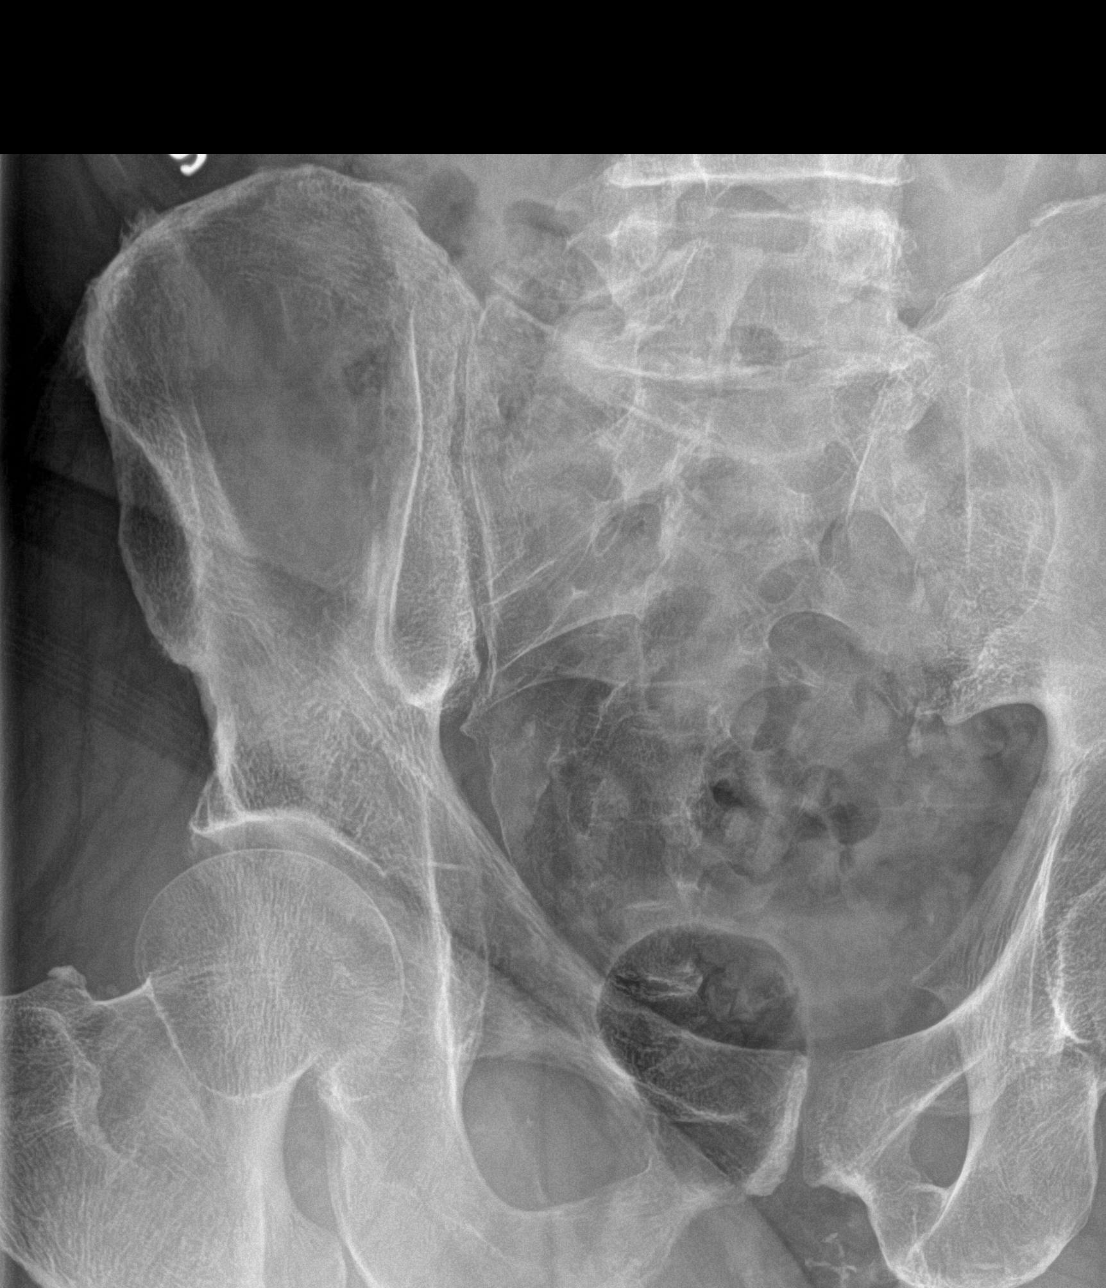

[4 of 4 positions shown; findings below may reference images not displayed]

FINDINGS: The SI joint spaces are preserved. There is no bony ankylosis nor
erosive change. The sacrum appears intact where visualized. There is
degenerative disc disease centered at L5-S1.
IMPRESSION: There is no acute or significant chronic abnormality of the SI
joints.

## 2016-09-11 IMAGING — CR DG LUMBAR SPINE COMPLETE W/ BEND
1 series · 6 of 6 positions shown · non-contrast
Comparison: 03/20/2015

CLINICAL DATA: Low back pain into both hips for years with no
injury

EXAM:
LUMBAR SPINE - COMPLETE WITH BENDING VIEWS

[Series 1: dg lumbar spine complete w/bend 6+v · 0.14mm/px · 6 of 6 slices shown]
[im 1/6]
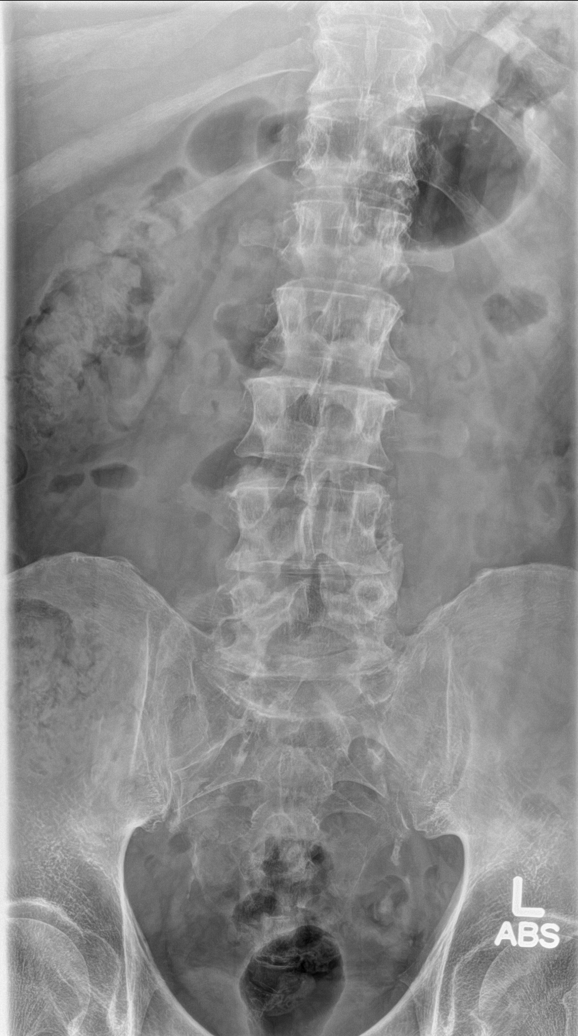
[im 2/6]
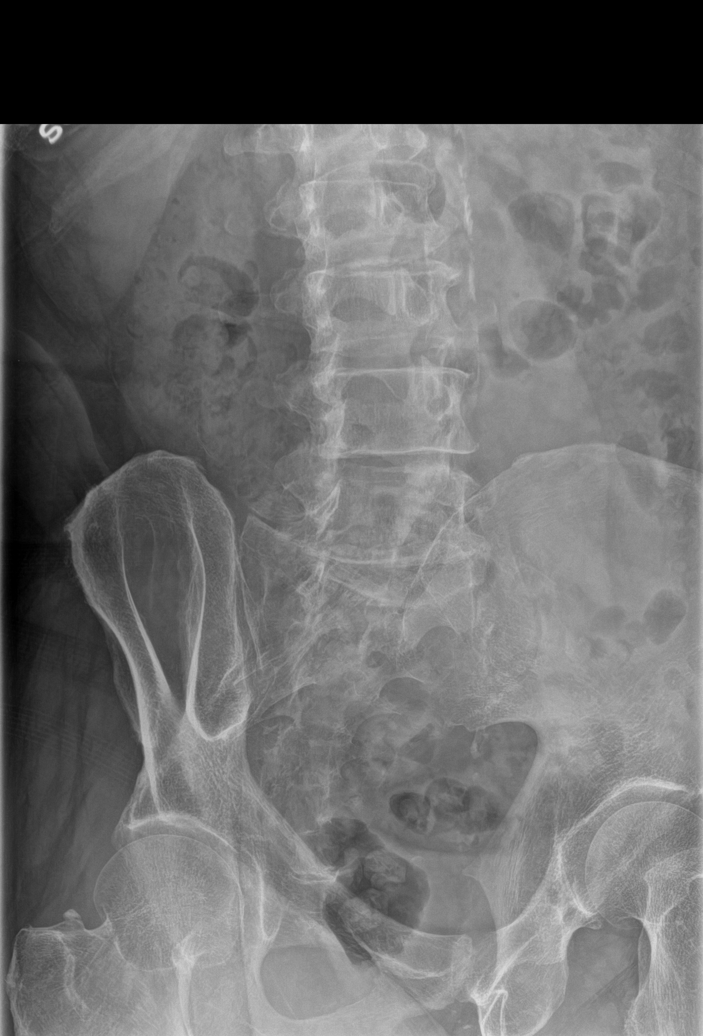
[im 3/6]
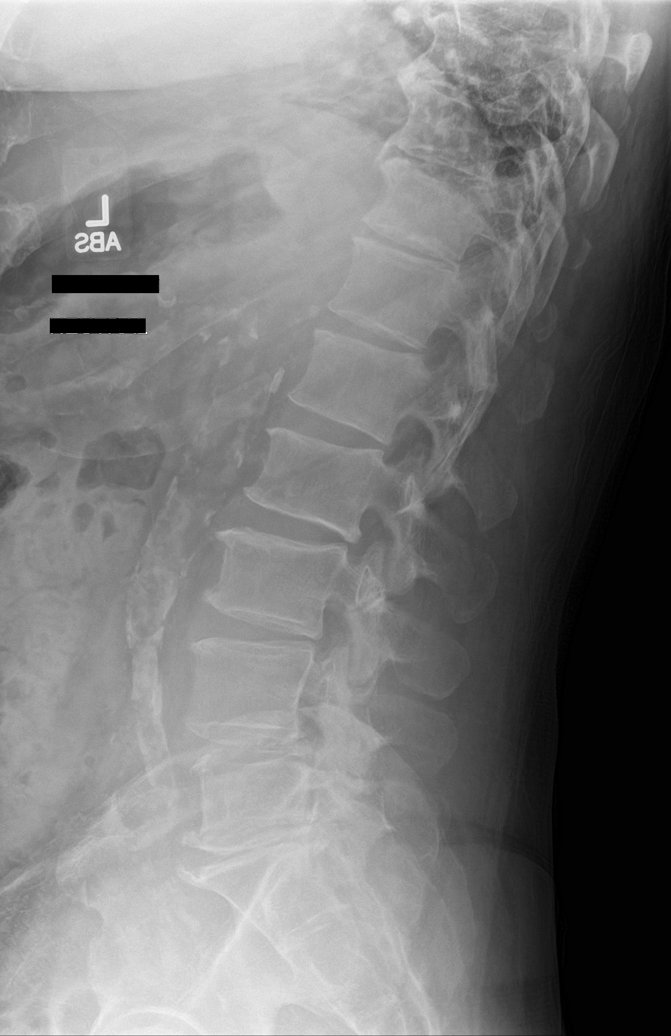
[im 4/6]
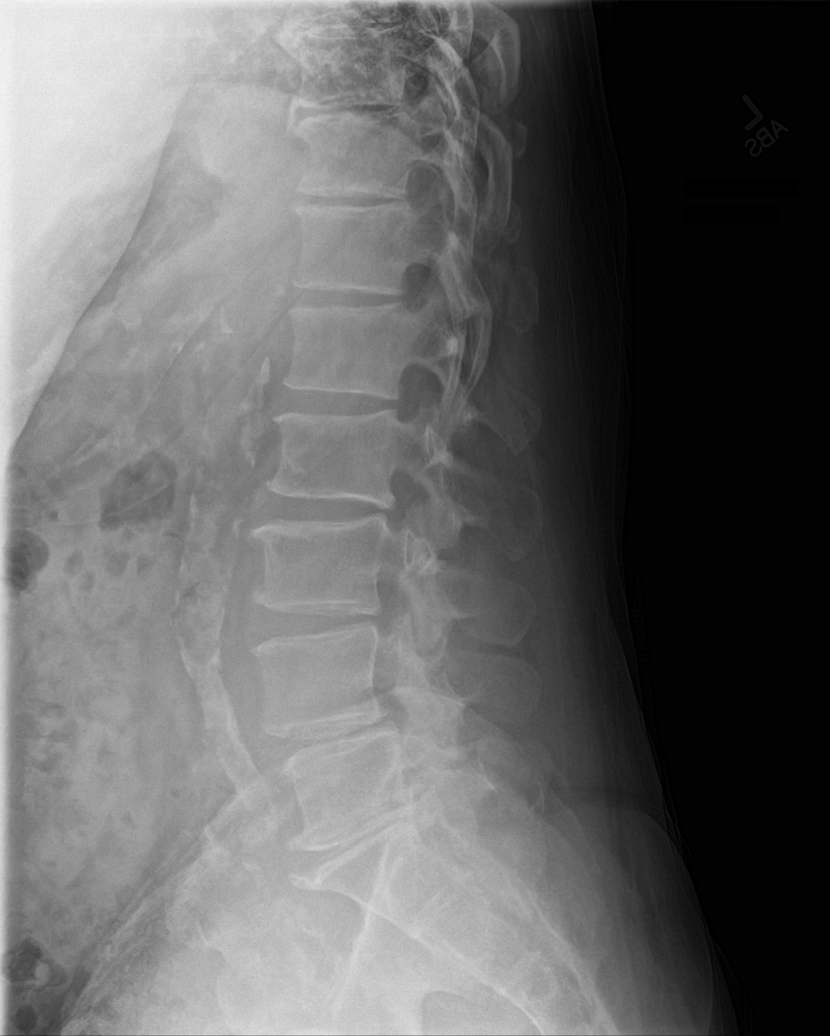
[im 5/6]
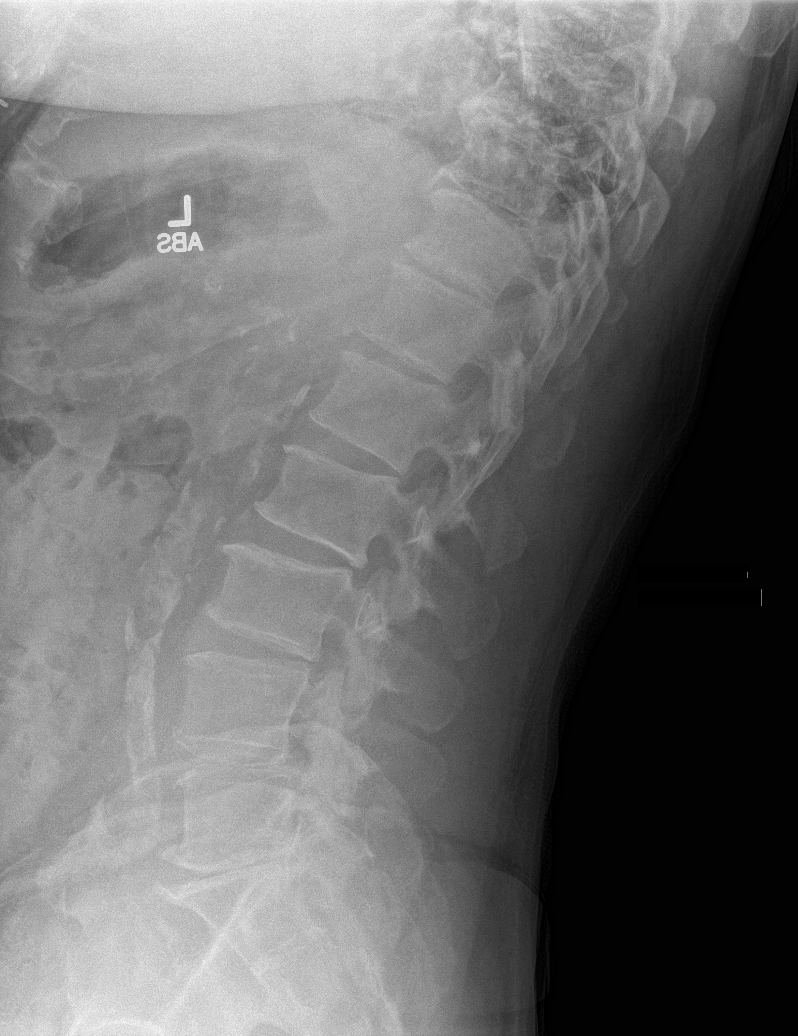
[im 6/6]
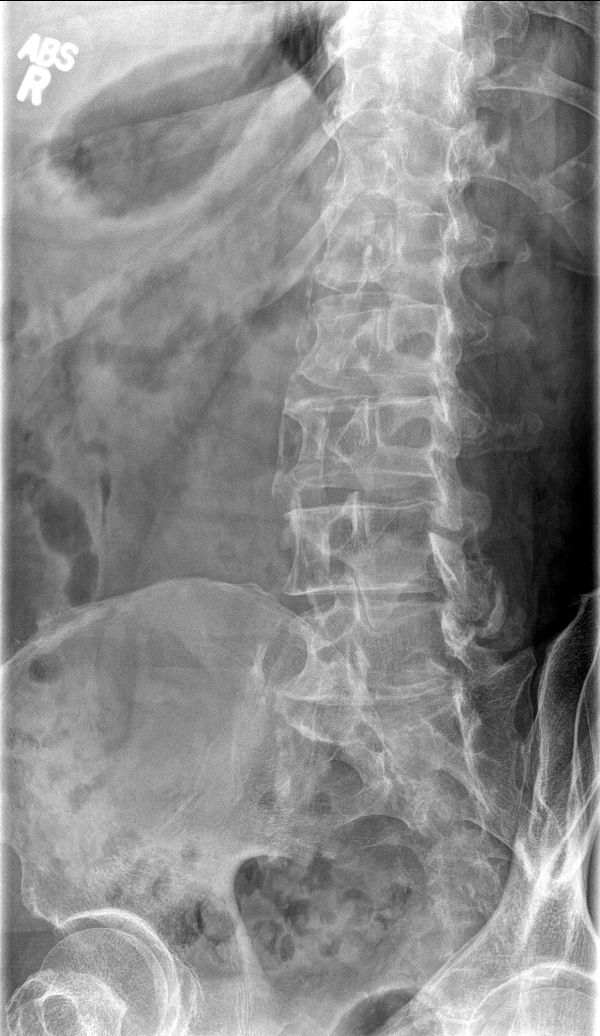

[6 of 6 positions shown; findings below may reference images not displayed]

FINDINGS: Severe aortoiliac calcification.

Grade 1 anterior listhesis of L4 on L5 due to degenerative facet
change. Stable from prior study. There is otherwise normal
anterior-posterior alignment.

There is mild degenerative disc disease at L1-2, L2-3, and L3-4. At
L4-5 and L5-S1 there is moderate degenerative disc disease. There is
degenerative facet change at L3-4, L4-5, and L5-S1.

With flexion, there is no change in alignment. There is no change in
alignment with extension.
IMPRESSION: Chronic degenerative changes similar to prior study with no acute
abnormality.

## 2016-09-12 ENCOUNTER — Emergency Department
Admission: EM | Admit: 2016-09-12 | Discharge: 2016-09-12 | Disposition: A | Discharge status: Home / Self Care | Payer: Medicare Other | Attending: Emergency Medicine | Admitting: Emergency Medicine

## 2016-09-12 ENCOUNTER — Encounter: Payer: Self-pay | Admitting: Emergency Medicine

## 2016-09-12 ENCOUNTER — Emergency Department: Payer: Medicare Other

## 2016-09-12 DIAGNOSIS — R509 Fever, unspecified: Secondary | ICD-10-CM | POA: Diagnosis present

## 2016-09-12 DIAGNOSIS — I10 Essential (primary) hypertension: Secondary | ICD-10-CM | POA: Insufficient documentation

## 2016-09-12 DIAGNOSIS — I252 Old myocardial infarction: Secondary | ICD-10-CM | POA: Insufficient documentation

## 2016-09-12 DIAGNOSIS — I25719 Atherosclerosis of autologous vein coronary artery bypass graft(s) with unspecified angina pectoris: Secondary | ICD-10-CM | POA: Diagnosis not present

## 2016-09-12 DIAGNOSIS — Z87891 Personal history of nicotine dependence: Secondary | ICD-10-CM | POA: Insufficient documentation

## 2016-09-12 DIAGNOSIS — J449 Chronic obstructive pulmonary disease, unspecified: Secondary | ICD-10-CM | POA: Insufficient documentation

## 2016-09-12 DIAGNOSIS — B349 Viral infection, unspecified: Secondary | ICD-10-CM | POA: Diagnosis not present

## 2016-09-12 LAB — POCT RAPID STREP A: STREPTOCOCCUS, GROUP A SCREEN (DIRECT): NEGATIVE

## 2016-09-12 LAB — RAPID INFLUENZA A&B ANTIGENS: Influenza B (ARMC): NEGATIVE

## 2016-09-12 LAB — RAPID INFLUENZA A&B ANTIGENS (ARMC ONLY): INFLUENZA A (ARMC): NEGATIVE

## 2016-09-12 MED ORDER — ACETAMINOPHEN 325 MG PO TABS
650.0000 mg | ORAL_TABLET | Freq: Once | ORAL | Status: AC | PRN
  Administered 2016-09-12: 650 mg via ORAL
  Filled 2016-09-12: qty 2

## 2016-09-12 NOTE — ED Provider Notes (Signed)
[facility] Emergency Department Provider Note  ____________________________________________  Time seen: Approximately 7:20 AM  I have reviewed the triage vital signs and the nursing notes.   HISTORY  Chief Complaint Fever    HPI [name] [name]. is a 61 y.o. male , NAD, presents to emergency room one-day history of fever and sore throat. Patient states he had onset of fever of 102F and scratchy sore throat yesterday. Has taken aspirin only for his symptoms without relief. Denies any sick contacts. Has not had any nasal congestion, runny nose, ear pain, sinus pressure. Has had a mild nonproductive cough. Denies any chest pain, wheezing or chest tightness. Does note that he has "shallow breathing" that is chronic but is noted no acute changes about his baseline breathing. Denies any abdominal pain, nausea, vomiting. Has had no diaphoresis or chills. Denies any changes in urinary or bowel habits. Has had no numbness, weakness, tingling. No headaches or visual changes.   Past Medical History:  Diagnosis Date  . Benign esophageal stricture   . Cardiomyopathy, secondary ([place])   . Coronary artery disease   . Diverticulitis   . Gout   . Hemorrhoids   . Hyperlipidemia   . Hypertension   . Sleep apnea   . Tubular adenoma of colon     Patient Active Problem List   Diagnosis Date Noted  . Chronic pain syndrome 08/16/2016  . Pneumonia 05/28/2016  . Pressure injury of skin 05/28/2016  . Opioid-induced constipation (OIC) 05/17/2016  . Hypokalemia 05/01/2016  . 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 Carotid atherosclerosis 04/04/2016  . Hypoalbuminemia 04/04/2016  . Right sided weakness 04/02/2016  . S/P CABG (coronary artery bypass graft) 03/30/2016  . Chest pain 02/29/2016  . Unstable angina ([place]) 02/24/2016  . Keloid skin disorder 01/17/2016  . Costochondritis 01/17/2016  . Long term current use of opiate analgesic 12/23/2015  . Long term  prescription opiate use 12/23/2015  . Vitamin D insufficiency 11/21/2015  . Lumbar facet syndrome (Bilateral) (R>L) 11/21/2015  . Lumbar spondylosis 11/21/2015  . Elevated sedimentation rate 11/01/2015  . Chronic low back pain (Bilateral) (R>L) 10/25/2015  . Chronic shoulder pain (Location of Tertiary source of pain) (Right) 10/25/2015  . Chronic hip pain (Left) 10/25/2015  . History of alcoholism ([place]) 10/25/2015  . Opiate use 10/25/2015  . Encounter for therapeutic drug level monitoring 10/25/2015  . Chronic chest wall pain (Location of Primary Source of Pain) (Incisional Midline) (since 04/22/2012) 10/25/2015  . Chronic lower extremity pain (Location of Secondary source of pain) (Bilateral) (R>L) 10/25/2015  . History of MI (myocardial infarction) (January 2014) 10/25/2015  . Encounter for pain management planning 10/25/2015  . Chronic sacroiliac joint pain (Bilateral) (L>R) 10/25/2015  . Neuropathic pain 10/25/2015  . Neurogenic pain 10/25/2015  . Incisional pain 10/25/2015  . HLD (hyperlipidemia) 03/16/2014  . H/O coronary artery bypass surgery 05/12/2012  . Alcohol withdrawal syndrome ([place]) 04/28/2012  . Injury of kidney 04/25/2012  . Obstructive apnea 04/22/2012  . BP (high blood pressure) 04/22/2012  . Arteriosclerosis of coronary artery 04/22/2012  . Apnea, sleep 04/21/2012  . Chronic obstructive pulmonary disease ([place]) 04/21/2012  . COPD (chronic obstructive pulmonary disease) ([place]) 04/21/2012    Past Surgical History:  Procedure Laterality Date  . cardiac bypass    . CARDIAC CATHETERIZATION Left 02/03/2016   Procedure: Left Heart Cath and Coronary Angiography;  Surgeon: Teodoro Spray, MD;  Location: [facility];  Service: Cardiovascular;  Laterality: Left;  .  CARDIAC SURGERY    . COLONOSCOPY WITH PROPOFOL N/A 04/29/2015   Procedure: COLONOSCOPY WITH PROPOFOL;  Surgeon: Hulen Luster, MD;  Location: [facility] ENDOSCOPY;  Service: Gastroenterology;  Laterality: N/A;  .  CORONARY ANGIOPLASTY WITH STENT PLACEMENT     x2  . CORONARY ARTERY BYPASS GRAFT    . ESOPHAGOGASTRODUODENOSCOPY (EGD) WITH PROPOFOL N/A 04/29/2015   Procedure: ESOPHAGOGASTRODUODENOSCOPY (EGD) WITH PROPOFOL;  Surgeon: Hulen Luster, MD;  Location: [facility] ENDOSCOPY;  Service: Gastroenterology;  Laterality: N/A;  . SEPTOPLASTY      Prior to Admission medications   Medication Sig Start Date End Date Taking? Authorizing Provider  allopurinol (ZYLOPRIM) 300 MG tablet Take 300 mg by mouth daily. *patient takes in the afternoon*    Historical Provider, MD  amLODipine (NORVASC) 10 MG tablet daily.  07/23/16   Historical Provider, MD  aspirin EC 81 MG tablet Take 81 mg by mouth every morning.    Historical Provider, MD  atorvastatin (LIPITOR) 40 MG tablet Take 40 mg by mouth at bedtime.     Historical Provider, MD  bisacodyl (DULCOLAX) 5 MG EC tablet Take 2 tablets (10 mg total) by mouth at bedtime as needed for moderate constipation ((Hold for loose stool)). 08/16/16   Milinda Pointer, MD  Blood Glucose Monitoring Suppl (FIFTY50 GLUCOSE METER 2.0) w/Device KIT Use as directed. Product selection permitted according to insurance preference. E11.9 Type 2 diabetes mellitus 04/09/16 04/09/17  Historical Provider, MD  Cholecalciferol (VITAMIN D3) 2000 units TABS Take 2,000 Units by mouth every morning.    Historical Provider, MD  docusate sodium (COLACE) 100 MG capsule Take 2 capsules (200 mg total) by mouth at bedtime as needed for moderate constipation. Do not use longer than 7 days. 08/16/16   Milinda Pointer, MD  gabapentin (NEURONTIN) 600 MG tablet Take 1 tablet (600 mg total) by mouth 2 (two) times daily. 08/19/16 11/17/16  Milinda Pointer, MD  hydrochlorothiazide (HYDRODIURIL) 25 MG tablet daily.  07/23/16   Historical Provider, MD  HYDROcodone-acetaminophen (NORCO/VICODIN) 5-325 MG tablet Take 1 tablet by mouth 2 (two) times daily as needed for severe pain. 10/18/16 11/17/16  Milinda Pointer, MD   HYDROcodone-acetaminophen (NORCO/VICODIN) 5-325 MG tablet Take 1 tablet by mouth 2 (two) times daily as needed for severe pain. 09/18/16 10/18/16  Milinda Pointer, MD  HYDROcodone-acetaminophen (NORCO/VICODIN) 5-325 MG tablet Take 1 tablet by mouth 2 (two) times daily as needed for severe pain. 08/19/16 09/18/16  Milinda Pointer, MD  isosorbide mononitrate (IMDUR) 30 MG 24 hr tablet Take 30 mg by mouth every morning.     Historical Provider, MD  Lancet Devices ([employer] LANCING DEVICE) MISC Use 1 each 3 (three) times daily. Product selection permitted according to insurance preference. E11.9 Type 2 diabetes mellitus 04/09/16 04/09/17  Historical Provider, MD  losartan (COZAAR) 100 MG tablet take 1 tablet by mouth once daily 08/02/16   Historical Provider, MD  metoprolol (LOPRESSOR) 50 MG tablet Take 50 mg by mouth 3 (three) times daily.    Historical Provider, MD  Multiple Vitamin (MULTIVITAMIN WITH MINERALS) TABS tablet Take 1 tablet by mouth every evening.    Historical Provider, MD  nitroGLYCERIN (NITROSTAT) 0.4 MG SL tablet Place 0.4 mg under the tongue every 5 (five) minutes x 3 doses as needed for chest pain.  03/07/16   Historical Provider, MD  Potassium 95 MG TABS Take 95 mg by mouth every morning.     Historical Provider, MD  Wheat Dextrin (BENEFIBER) POWD Stir 2 tsp. TID into 4-8 oz of  any non-carbonated beverage or soft food (hot or cold) 08/16/16   Milinda Pointer, MD    Allergies Patient has no known allergies.  Family History  Problem Relation Age of Onset  . Heart disease Mother   . Hypertension Father     Social History Social History  Substance Use Topics  . Smoking status: Former Smoker    Years: 25.00  . Smokeless tobacco: Former Systems developer  . Alcohol use Yes     Comment: occasional     Review of Systems  Constitutional: Positive fever with MAXIMUM TEMPERATURE of 102F. No chills, rigors or fatigue. No diaphoresis. Eyes: No visual changes. No discharge, redness ENT: Positive  sore throat. No ear pain, sinus pressure, nasal congestion, runny nose. Cardiovascular: No chest pain, palpitations. Respiratory: Positive mild, dry nonproductive cough. No shortness of breath. No wheezing.  Gastrointestinal: No abdominal pain.  No nausea, vomiting.  No diarrhea.  No constipation. Genitourinary: Negative for dysuria. No hematuria. No urinary hesitancy, urgency or increased frequency. Musculoskeletal: Negative for general myalgias.  Skin: Negative for rash. Neurological: Negative for headaches, focal weakness or numbness. No tingling 10-point ROS otherwise negative.  ____________________________________________   PHYSICAL EXAM:  VITAL SIGNS: ED Triage Vitals  Enc Vitals Group     BP 09/12/16 0651 136/83     Pulse Rate 09/12/16 0651 100     Resp 09/12/16 0651 18     Temp 09/12/16 0651 (!) 100.4 F (38 C)     Temp Source 09/12/16 0651 Oral     SpO2 09/12/16 0651 98 %     Weight 09/12/16 0652 168 lb (76.2 kg)     Height 09/12/16 0652 '5\' 3"'  (1.6 m)     Head Circumference --      Peak Flow --      Pain Score 09/12/16 0653 8     Pain Loc --      Pain Edu? --      Excl. in [place]? --      Constitutional: Alert and oriented. Well appearing and in no acute distress.Patient is laying comfortably in the exam bed, reading a newspaper and completing a crossword puzzle. Eyes: Conjunctivae are normal without icterus, injection or discharge. Head: Atraumatic. ENT:      Ears: TMs visualized bilaterally without erythema, bulging, effusion or perforation.      Nose: No congestion but trace clear rhinorrhea.      Mouth/Throat: Mucous membranes are moist. Pharynx without erythema, swelling, exudate. Uvula is midline. Airways patent. Clear postnasal drip. Neck: No stridor. Supple with full range of motion. Hematological/Lymphatic/Immunilogical: No cervical lymphadenopathy. Cardiovascular: Normal rate, regular rhythm. Normal S1 and S2.  Good peripheral circulation. Respiratory:  Normal respiratory effort without tachypnea or retractions. Lungs CTAB with breath sounds noted in all lung fields. No wheeze, rhonchi, rales Musculoskeletal: No lower extremity tenderness nor edema.  No joint effusions. Neurologic:  Normal speech and language. Normal gait and posture. No gross focal neurologic deficits are appreciated.  Skin:  Skin is warm, dry and intact. No rash noted. Psychiatric: Mood and affect are normal. Speech and behavior are normal. Patient exhibits appropriate insight and judgement.   ____________________________________________   LABS (all labs ordered are listed, but only abnormal results are displayed)  Labs Reviewed  RAPID INFLUENZA A&B ANTIGENS ([facility] ONLY)  CULTURE, GROUP A STREP [facility])  POCT RAPID STREP A   ____________________________________________  EKG  None ____________________________________________  RADIOLOGY I, Judithe Modest Hagler, personally viewed and evaluated these images (plain radiographs) as part of  my medical decision making, as well as reviewing the written report by the radiologist.  Dg Chest 2 View  Result Date: 09/12/2016 CLINICAL DATA:  Shortness of breath and fever since yesterday. EXAM: CHEST  2 VIEW COMPARISON:  05/28/2016 FINDINGS: The heart size and mediastinal contours are within normal limits. Aortic atherosclerosis. Mild left basilar scarring is stable. No evidence of pulmonary infiltrate or edema. No evidence of pleural effusion. Prior CABG. Surgical clips also seen in the right axillary region. IMPRESSION: No active cardiopulmonary disease. Electronically Signed   By: Earle Gell M.D.   On: 09/12/2016 08:02    ____________________________________________    PROCEDURES  Procedure(s) performed: None   Procedures   Medications  acetaminophen (TYLENOL) tablet 650 mg (650 mg Oral Given 09/12/16 0659)     ____________________________________________   INITIAL IMPRESSION / ASSESSMENT AND PLAN / ED  COURSE  Pertinent labs & imaging results that were available during my care of the patient were reviewed by me and considered in my medical decision making (see chart for details).  Clinical Course     Patient's diagnosis is consistent with viral illness. Patient will be discharged home with instructions to take OTC Robitussin (no D or DM formulation) as needed for cough and may take Tylenol as needed for fever. Patient was negative for strep, flu and CXR was negative for acute cardiopulmonary changes. Throughout the patient's ED course he was well-appearing and in no acute distress. He maintains that he has had no changes in chronic medical issues. Patient is to follow up with his primary care provider in 1-2 days for recheck. Patient is given strict precautions to return to the ED for any worsening or new symptoms.    ____________________________________________  FINAL CLINICAL IMPRESSION(S) / ED DIAGNOSES  Final diagnoses:  Viral illness      NEW MEDICATIONS STARTED DURING THIS VISIT:  Discharge Medication List as of 09/12/2016  8:51 AM           Braxton Feathers, PA-C 09/12/16 9323    Drenda Freeze, MD 09/12/16 1531

## 2016-09-12 NOTE — Discharge Instructions (Signed)
Take Tylenol as needed for aches or fever.  May take OTC Robitussin only for cough as needed.   Please see your PCP in follow up in 2 days if not improving.   Return to the ER if you have worsening cough, sputum is thickening, shortness of breath or chest pain.

## 2016-09-12 NOTE — ED Triage Notes (Signed)
Pt ambulatory to triage with steady gait with c/o fever since yesterday accompanied by sore throat, tmax at home 102. Pt a&o x 4, skin warm and dry.

## 2016-09-14 LAB — CULTURE, GROUP A STREP (THRC)

## 2016-11-08 ENCOUNTER — Encounter: Payer: Self-pay | Admitting: Pain Medicine

## 2016-11-08 ENCOUNTER — Ambulatory Visit: Payer: Medicare Other | Attending: Pain Medicine | Admitting: Pain Medicine

## 2016-11-08 VITALS — BP 110/78 | HR 69 | Temp 97.7°F | Ht 63.0 in | Wt 165.0 lb

## 2016-11-08 DIAGNOSIS — R700 Elevated erythrocyte sedimentation rate: Secondary | ICD-10-CM | POA: Diagnosis not present

## 2016-11-08 DIAGNOSIS — Z951 Presence of aortocoronary bypass graft: Secondary | ICD-10-CM | POA: Insufficient documentation

## 2016-11-08 DIAGNOSIS — Z9889 Other specified postprocedural states: Secondary | ICD-10-CM | POA: Insufficient documentation

## 2016-11-08 DIAGNOSIS — E559 Vitamin D deficiency, unspecified: Secondary | ICD-10-CM | POA: Insufficient documentation

## 2016-11-08 DIAGNOSIS — F1190 Opioid use, unspecified, uncomplicated: Secondary | ICD-10-CM

## 2016-11-08 DIAGNOSIS — Z981 Arthrodesis status: Secondary | ICD-10-CM | POA: Insufficient documentation

## 2016-11-08 DIAGNOSIS — M25552 Pain in left hip: Secondary | ICD-10-CM | POA: Diagnosis not present

## 2016-11-08 DIAGNOSIS — I252 Old myocardial infarction: Secondary | ICD-10-CM | POA: Insufficient documentation

## 2016-11-08 DIAGNOSIS — M109 Gout, unspecified: Secondary | ICD-10-CM | POA: Insufficient documentation

## 2016-11-08 DIAGNOSIS — S37009A Unspecified injury of unspecified kidney, initial encounter: Secondary | ICD-10-CM | POA: Insufficient documentation

## 2016-11-08 DIAGNOSIS — K5903 Drug induced constipation: Secondary | ICD-10-CM | POA: Insufficient documentation

## 2016-11-08 DIAGNOSIS — M533 Sacrococcygeal disorders, not elsewhere classified: Secondary | ICD-10-CM | POA: Insufficient documentation

## 2016-11-08 DIAGNOSIS — Z79891 Long term (current) use of opiate analgesic: Secondary | ICD-10-CM | POA: Insufficient documentation

## 2016-11-08 DIAGNOSIS — M47816 Spondylosis without myelopathy or radiculopathy, lumbar region: Secondary | ICD-10-CM | POA: Insufficient documentation

## 2016-11-08 DIAGNOSIS — I6529 Occlusion and stenosis of unspecified carotid artery: Secondary | ICD-10-CM | POA: Insufficient documentation

## 2016-11-08 DIAGNOSIS — T402X5A Adverse effect of other opioids, initial encounter: Secondary | ICD-10-CM | POA: Diagnosis not present

## 2016-11-08 DIAGNOSIS — I2510 Atherosclerotic heart disease of native coronary artery without angina pectoris: Secondary | ICD-10-CM | POA: Diagnosis not present

## 2016-11-08 DIAGNOSIS — M940 Chondrocostal junction syndrome [Tietze]: Secondary | ICD-10-CM | POA: Diagnosis not present

## 2016-11-08 DIAGNOSIS — I429 Cardiomyopathy, unspecified: Secondary | ICD-10-CM | POA: Insufficient documentation

## 2016-11-08 DIAGNOSIS — Z87891 Personal history of nicotine dependence: Secondary | ICD-10-CM | POA: Insufficient documentation

## 2016-11-08 DIAGNOSIS — Z7982 Long term (current) use of aspirin: Secondary | ICD-10-CM | POA: Insufficient documentation

## 2016-11-08 DIAGNOSIS — M792 Neuralgia and neuritis, unspecified: Secondary | ICD-10-CM | POA: Diagnosis not present

## 2016-11-08 DIAGNOSIS — E876 Hypokalemia: Secondary | ICD-10-CM | POA: Insufficient documentation

## 2016-11-08 DIAGNOSIS — X58XXXA Exposure to other specified factors, initial encounter: Secondary | ICD-10-CM | POA: Insufficient documentation

## 2016-11-08 DIAGNOSIS — G8929 Other chronic pain: Secondary | ICD-10-CM | POA: Diagnosis not present

## 2016-11-08 DIAGNOSIS — R0789 Other chest pain: Secondary | ICD-10-CM | POA: Insufficient documentation

## 2016-11-08 DIAGNOSIS — M79605 Pain in left leg: Secondary | ICD-10-CM | POA: Diagnosis not present

## 2016-11-08 DIAGNOSIS — E785 Hyperlipidemia, unspecified: Secondary | ICD-10-CM | POA: Diagnosis not present

## 2016-11-08 DIAGNOSIS — R079 Chest pain, unspecified: Secondary | ICD-10-CM | POA: Diagnosis present

## 2016-11-08 DIAGNOSIS — M79604 Pain in right leg: Secondary | ICD-10-CM | POA: Diagnosis present

## 2016-11-08 DIAGNOSIS — F10239 Alcohol dependence with withdrawal, unspecified: Secondary | ICD-10-CM | POA: Insufficient documentation

## 2016-11-08 DIAGNOSIS — M25511 Pain in right shoulder: Secondary | ICD-10-CM | POA: Diagnosis present

## 2016-11-08 DIAGNOSIS — G4730 Sleep apnea, unspecified: Secondary | ICD-10-CM | POA: Insufficient documentation

## 2016-11-08 DIAGNOSIS — I10 Essential (primary) hypertension: Secondary | ICD-10-CM | POA: Insufficient documentation

## 2016-11-08 DIAGNOSIS — E8809 Other disorders of plasma-protein metabolism, not elsewhere classified: Secondary | ICD-10-CM | POA: Insufficient documentation

## 2016-11-08 DIAGNOSIS — J449 Chronic obstructive pulmonary disease, unspecified: Secondary | ICD-10-CM | POA: Insufficient documentation

## 2016-11-08 DIAGNOSIS — G894 Chronic pain syndrome: Secondary | ICD-10-CM | POA: Insufficient documentation

## 2016-11-08 DIAGNOSIS — M545 Low back pain: Secondary | ICD-10-CM | POA: Insufficient documentation

## 2016-11-08 DIAGNOSIS — I700 Atherosclerosis of aorta: Secondary | ICD-10-CM | POA: Insufficient documentation

## 2016-11-08 MED ORDER — HYDROCODONE-ACETAMINOPHEN 5-325 MG PO TABS: 1.0000 | ORAL_TABLET | Freq: Two times a day (BID) | ORAL | 0 refills | Status: DC | PRN

## 2016-11-08 MED ORDER — BISACODYL 5 MG PO TBEC: 10.0000 mg | DELAYED_RELEASE_TABLET | Freq: Every evening | ORAL | 99 refills | Status: DC | PRN

## 2016-11-08 MED ORDER — DOCUSATE SODIUM 100 MG PO CAPS: 200.0000 mg | ORAL_CAPSULE | Freq: Every evening | ORAL | 99 refills | Status: DC | PRN

## 2016-11-08 MED ORDER — GABAPENTIN 600 MG PO TABS: 600.0000 mg | ORAL_TABLET | Freq: Two times a day (BID) | ORAL | 0 refills | Status: DC

## 2016-11-08 NOTE — Patient Instructions (Addendum)
You were given 3 prescriptions for Hydrocodone today and you have scripts for stool softeners and gabapentin at the pharmacy for pick up.     Pain Score  Introduction: The pain score used by this practice is the Verbal Numerical Rating Scale (VNRS-11). This is an 11-point scale. It is for adults and children 10 years or older. There are significant differences in how the pain score is reported, used, and applied. Forget everything you learned in the past and learn this scoring system.  General Information: The scale should reflect your current level of pain. Unless you are specifically asked for the level of your worst pain, or your average pain. If you are asked for one of these two, then it should be understood that it is over the past 24 hours.  Basic Activities of Daily Living (ADL): Personal hygiene, dressing, eating, transferring, and using restroom.  Instructions: Most patients tend to report their level of pain as a combination of two factors, their physical pain and their psychosocial pain. This last one is also known as "suffering" and it is reflection of how physical pain affects you socially and psychologically. From now on, report them separately. From this point on, when asked to report your pain level, report only your physical pain. Use the following table for reference.  Pain Clinic Pain Levels (0-5/10)  Pain Level Score Description  No Pain 0   Mild pain 1 Nagging, annoying, but does not interfere with basic activities of daily living (ADL). Patients are able to eat, bathe, get dressed, toileting (being able to get on and off the toilet and perform personal hygiene functions), transfer (move in and out of bed or a chair without assistance), and maintain continence (able to control bladder and bowel functions). Blood pressure and heart rate are unaffected. A normal heart rate for a healthy adult ranges from 60 to 100 bpm (beats per minute).   Mild to moderate pain 2 Noticeable and  distracting. Impossible to hide from other people. More frequent flare-ups. Still possible to adapt and function close to normal. It can be very annoying and may have occasional stronger flare-ups. With discipline, patients may get used to it and adapt.   Moderate pain 3 Interferes significantly with activities of daily living (ADL). It becomes difficult to feed, bathe, get dressed, get on and off the toilet or to perform personal hygiene functions. Difficult to get in and out of bed or a chair without assistance. Very distracting. With effort, it can be ignored when deeply involved in activities.   Moderately severe pain 4 Impossible to ignore for more than a few minutes. With effort, patients may still be able to manage work or participate in some social activities. Very difficult to concentrate. Signs of autonomic nervous system discharge are evident: dilated pupils (mydriasis); mild sweating (diaphoresis); sleep interference. Heart rate becomes elevated (>115 bpm). Diastolic blood pressure (lower number) rises above 100 mmHg. Patients find relief in laying down and not moving.   Severe pain 5 Intense and extremely unpleasant. Associated with frowning face and frequent crying. Pain overwhelms the senses.  Ability to do any activity or maintain social relationships becomes significantly limited. Conversation becomes difficult. Pacing back and forth is common, as getting into a comfortable position is nearly impossible. Pain wakes you up from deep sleep. Physical signs will be obvious: pupillary dilation; increased sweating; goosebumps; brisk reflexes; cold, clammy hands and feet; nausea, vomiting or dry heaves; loss of appetite; significant sleep disturbance with inability to fall  asleep or to remain asleep. When persistent, significant weight loss is observed due to the complete loss of appetite and sleep deprivation.  Blood pressure and heart rate becomes significantly elevated. Caution: If elevated  blood pressure triggers a pounding headache associated with blurred vision, then the patient should immediately seek attention at an urgent or emergency care unit, as these may be signs of an impending stroke.    Emergency Department Pain Levels (6-10/10)  Emergency Room Pain 6 Severely limiting. Requires emergency care and should not be seen or managed at an outpatient pain management facility. Communication becomes difficult and requires great effort. Assistance to reach the emergency department may be required. Facial flushing and profuse sweating along with potentially dangerous increases in heart rate and blood pressure will be evident.   Distressing pain 7 Self-care is very difficult. Assistance is required to transport, or use restroom. Assistance to reach the emergency department will be required. Tasks requiring coordination, such as bathing and getting dressed become very difficult.   Disabling pain 8 Self-care is no longer possible. At this level, pain is disabling. The individual is unable to do even the most "basic" activities such as walking, eating, bathing, dressing, transferring to a bed, or toileting. Fine motor skills are lost. It is difficult to think clearly.   Incapacitating pain 9 Pain becomes incapacitating. Thought processing is no longer possible. Difficult to remember your own name. Control of movement and coordination are lost.   The worst pain imaginable 10 At this level, most patients pass out from pain. When this level is reached, collapse of the autonomic nervous system occurs, leading to a sudden drop in blood pressure and heart rate. This in turn results in a temporary and dramatic drop in blood flow to the brain, leading to a loss of consciousness. Fainting is one of the body's self defense mechanisms. Passing out puts the brain in a calmed state and causes it to shut down for a while, in order to begin the healing process.    Summary: 1. Refer to this scale when  providing [place] with your pain level. 2. Be accurate and careful when reporting your pain level. This will help with your care. 3. Over-reporting your pain level will lead to loss of credibility. 4. Even a level of 1/10 means that there is pain and will be treated at our facility. 5. High, inaccurate reporting will be documented as "Symptom Exaggeration", leading to loss of credibility and suspicions of possible secondary gains such as obtaining more narcotics, or wanting to appear disabled, for fraudulent reasons. 6. Only pain levels of 5 or below will be seen at our facility. 7. Pain levels of 6 and above will be sent to the Emergency Department and the appointment cancelled. _____________________________________________________________________________________________

## 2016-11-08 NOTE — Progress Notes (Signed)
Patient's Name: [name] [name].  MRN: [id number]  Referring Provider: Elisabeth Cara, NP  DOB: 08-Jul-1956  PCP: Elisabeth Cara, NP  DOS: 11/08/2016  Note by: Kathlen Brunswick. Dossie Arbour, MD  Service setting: Ambulatory outpatient  Specialty: Interventional Pain Management  Location: [facility] (AMB) Pain Management Facility    Patient type: Established   Primary Reason(s) for Visit: Encounter for prescription drug management (Level of risk: moderate) CC: Chest Pain; Shoulder Pain (right); and Leg Pain (bilateral)  HPI  [name] [name] is a 61 y.o. year old, male patient, who comes today for a medication management evaluation. He has Apnea, sleep; H/O coronary artery bypass surgery; Obstructive apnea; BP (high blood pressure); HLD (hyperlipidemia); Arteriosclerosis of coronary artery; Chronic obstructive pulmonary disease ([place]); Alcohol withdrawal syndrome ([place]); Injury of kidney; Chronic low back pain (Bilateral) (R>L); Chronic shoulder pain (Location of Tertiary source of pain) (Right); Chronic hip pain (Left); History of alcoholism ([place]); Opiate use; Encounter for therapeutic drug level monitoring; Chronic chest wall pain (Location of Primary Source of Pain) (Incisional Midline) (since 04/22/2012); Chronic lower extremity pain (Location of Secondary source of pain) (Bilateral) (R>L); History of MI (myocardial infarction) (January 2014); Encounter for pain management planning; Chronic sacroiliac joint pain (Bilateral) (L>R); Neuropathic pain; Neurogenic pain; Incisional pain; Elevated sedimentation rate; Vitamin D insufficiency; Lumbar facet syndrome (Bilateral) (R>L); Lumbar spondylosis; Long term current use of opiate analgesic; Long term prescription opiate use; Keloid skin disorder; Costochondritis; Unstable angina ([place]); Chest pain; Brachial plexus injury, right; Carotid atherosclerosis; Hypoalbuminemia; Hypokalemia; Right sided weakness; S/P CABG (coronary artery bypass graft); Opioid-induced constipation (OIC);  Pneumonia; Pressure injury of skin; COPD (chronic obstructive pulmonary disease) ([place]); and Chronic pain syndrome on his problem list. His primarily concern today is the Chest Pain; Shoulder Pain (right); and Leg Pain (bilateral)  Pain Assessment: Self-Reported Pain Score: 4 /10 Clinically the patient looks like a 2/10 Reported level is inconsistent with clinical observations. Information on the proper use of the pain scale provided to the patient today Pain Type: Chronic pain Pain Location: Chest Pain Orientation: Right, Left Pain Descriptors / Indicators: Aching, Sharp, Dull Pain Frequency: Constant  [name] [name] was last scheduled for an appointment on 08/16/2016 for medication management. During today's appointment we reviewed [name] [name] chronic pain status, as well as his outpatient medication regimen.  The patient  reports that he does not use drugs. His body mass index is 29.23 kg/m.  Further details on both, my assessment(s), as well as the proposed treatment plan, please see below.  Controlled Substance Pharmacotherapy Assessment REMS (Risk Evaluation and Mitigation Strategy)  Analgesic:Hydrocodone/APAP 5/325 one tablet twice a day (10 mg per day) MME/day:10 mg/day.   Zenovia Jarred, RN  11/08/2016 10:38 AM  Signed Nursing Pain Medication Assessment:  Safety precautions to be maintained throughout the outpatient stay will include: orient to surroundings, keep bed in low position, maintain call bell within reach at all times, provide assistance with transfer out of bed and ambulation.  Medication Inspection Compliance: Pill count conducted under aseptic conditions, in front of the patient. Neither the pills nor the bottle was removed from the patient's sight at any time. Once count was completed pills were immediately returned to the patient in their original bottle.  Medication: Hydrocodone/APAP Pill/Patch Count: 22 of 60 pills remain Bottle Appearance: Standard pharmacy  container. Clearly labeled. Filled Date:02 / 16 / 2018 Last Medication intake:  Today   Pharmacokinetics: Liberation and absorption (onset of action): WNL Distribution (time to peak effect): WNL Metabolism and excretion (duration  of action): WNL         Pharmacodynamics: Desired effects: Analgesia: [name] [name] reports >50% benefit. Functional ability: Patient reports that medication allows him to accomplish basic ADLs Clinically meaningful improvement in function (CMIF): Sustained CMIF goals met Perceived effectiveness: Described as relatively effective, allowing for increase in activities of daily living (ADL) Undesirable effects: Side-effects or Adverse reactions: None reported Monitoring: [place] PMP: Online review of the past 90-monthperiod conducted. Compliant with practice rules and regulations List of all UDS test(s) done:  Lab Results  Component Value Date   TOXASSSELUR FINAL 01/16/2016   TOXASSSELUR FINAL 12/23/2015   TOXASSSELUR FINAL 10/25/2015   Last UDS on record: ToxAssure Select 13  Date Value Ref Range Status  01/16/2016 FINAL  Final    Comment:    ==================================================================== TOXASSURE SELECT 13 (MW) ==================================================================== Test                             Result       Flag       Units Drug Present and Declared for Prescription Verification   Hydrocodone                    700          EXPECTED   ng/mg creat   Hydromorphone                  168          EXPECTED   ng/mg creat   Dihydrocodeine                 43           EXPECTED   ng/mg creat   Norhydrocodone                 271          EXPECTED   ng/mg creat    Sources of hydrocodone include scheduled prescription    medications. Hydromorphone, dihydrocodeine and norhydrocodone are    expected metabolites of hydrocodone. Hydromorphone and    dihydrocodeine are also available as scheduled prescription     medications. ==================================================================== Test                      Result    Flag   Units      Ref Range   Creatinine              120              mg/dL      >=20 ==================================================================== Declared Medications:  The flagging and interpretation on this report are based on the  following declared medications.  Unexpected results may arise from  inaccuracies in the declared medications.  **Note: The testing scope of this panel includes these medications:  Hydrocodone (Hydrocodone-Acetaminophen)  **Note: The testing scope of this panel does not include following  reported medications:  Acetaminophen (Hydrocodone-Acetaminophen)  Allopurinol  Amlodipine  Aspirin  Atorvastatin  Cholecalciferol  Gabapentin  Hydrochlorothiazide  Losartan (Losartan Potassium)  Meloxicam  Metoprolol  Multivitamin  Vitamin D2 (Ergocalciferol) ==================================================================== For clinical consultation, please call [phone] ====================================================================    UDS interpretation: Compliant          Medication Assessment Form: Reviewed. Patient indicates being compliant with therapy Treatment compliance: Compliant Risk Assessment Profile: Aberrant behavior: See prior evaluations. None observed or detected today Comorbid factors increasing risk of overdose: See  prior notes. No additional risks detected today Risk of substance use disorder (SUD): Low Opioid Risk Tool (ORT) Total Score: 0  Interpretation Table:  Score <3 = Low Risk for SUD  Score between 4-7 = Moderate Risk for SUD  Score >8 = High Risk for Opioid Abuse   Risk Mitigation Strategies:  Patient Counseling: Covered Patient-Prescriber Agreement (PPA): Present and active  Notification to other healthcare providers: Done  Pharmacologic Plan: No change in therapy, at this  time  Laboratory Chemistry  Inflammation Markers Lab Results  Component Value Date   CRP <0.5 10/26/2015   ESRSEDRATE 32 (H) 10/26/2015   (CRP: Acute Phase) (ESR: Chronic Phase) Renal Function Markers Lab Results  Component Value Date   BUN 12 05/29/2016   CREATININE 0.69 05/29/2016   GFRAA >60 05/29/2016   GFRNONAA >60 05/29/2016   Hepatic Function Markers Lab Results  Component Value Date   AST 39 02/24/2016   ALT 65 (H) 02/24/2016   ALBUMIN 4.3 02/24/2016   ALKPHOS 69 02/24/2016   Electrolytes Lab Results  Component Value Date   NA 139 05/29/2016   K 3.4 (L) 05/29/2016   CL 104 05/29/2016   CALCIUM 8.9 05/29/2016   MG 2.0 10/26/2015   Neuropathy Markers No results found for: [id number] Bone Pathology Markers Lab Results  Component Value Date   ALKPHOS 69 02/24/2016   VD25OH 26.5 (L) 10/26/2015   VD125OH2TOT 30.8 10/26/2015   CALCIUM 8.9 05/29/2016   Coagulation Parameters Lab Results  Component Value Date   INR 0.97 02/09/2015   LABPROT 13.1 02/09/2015   APTT > 160.0 (HH) 04/21/2012   PLT 143 (L) 05/29/2016   Cardiovascular Markers Lab Results  Component Value Date   BNP 255 (H) 08/01/2014   HGB 12.7 (L) 05/29/2016   HCT 36.8 (L) 05/29/2016   Note: Lab results reviewed.  Recent Diagnostic Imaging Review  Dg Chest 2 View  Result Date: 09/12/2016 CLINICAL DATA:  Shortness of breath and fever since yesterday. EXAM: CHEST  2 VIEW COMPARISON:  05/28/2016 FINDINGS: The heart size and mediastinal contours are within normal limits. Aortic atherosclerosis. Mild left basilar scarring is stable. No evidence of pulmonary infiltrate or edema. No evidence of pleural effusion. Prior CABG. Surgical clips also seen in the right axillary region. IMPRESSION: No active cardiopulmonary disease. Electronically Signed   By: Earle Gell M.D.   On: 09/12/2016 08:02   Note: Imaging results reviewed.          Meds  The patient has a current medication list which includes  the following prescription(s): allopurinol, amlodipine, aspirin ec, atorvastatin, bisacodyl, fifty50 glucose meter 2.0, vitamin d3, docusate sodium, gabapentin, hydrochlorothiazide, hydrocodone-acetaminophen, hydrocodone-acetaminophen, hydrocodone-acetaminophen, isosorbide mononitrate, cvs lancing device, losartan, metoprolol, multivitamin with minerals, nitroglycerin, and potassium.  Current Outpatient Prescriptions on File Prior to Visit  Medication Sig  . allopurinol (ZYLOPRIM) 300 MG tablet Take 300 mg by mouth daily. *patient takes in the afternoon*  . amLODipine (NORVASC) 10 MG tablet daily.   Marland Kitchen aspirin EC 81 MG tablet Take 81 mg by mouth every morning.  Marland Kitchen atorvastatin (LIPITOR) 40 MG tablet Take 40 mg by mouth at bedtime.   . Blood Glucose Monitoring Suppl (FIFTY50 GLUCOSE METER 2.0) w/Device KIT Use as directed. Product selection permitted according to insurance preference. E11.9 Type 2 diabetes mellitus  . Cholecalciferol (VITAMIN D3) 2000 units TABS Take 2,000 Units by mouth every morning.  . hydrochlorothiazide (HYDRODIURIL) 25 MG tablet daily.   . isosorbide mononitrate (IMDUR) 30 MG 24 hr tablet  Take 30 mg by mouth every morning.   Elmore Guise Devices ([employer] LANCING DEVICE) MISC Use 1 each 3 (three) times daily. Product selection permitted according to insurance preference. E11.9 Type 2 diabetes mellitus  . losartan (COZAAR) 100 MG tablet take 1 tablet by mouth once daily  . metoprolol (LOPRESSOR) 50 MG tablet Take 50 mg by mouth 3 (three) times daily.  . Multiple Vitamin (MULTIVITAMIN WITH MINERALS) TABS tablet Take 1 tablet by mouth every evening.  . nitroGLYCERIN (NITROSTAT) 0.4 MG SL tablet Place 0.4 mg under the tongue every 5 (five) minutes x 3 doses as needed for chest pain.   Marland Kitchen Potassium 95 MG TABS Take 95 mg by mouth every morning.    No current facility-administered medications on file prior to visit.    ROS  Constitutional: Denies any fever or chills Gastrointestinal: No  reported hemesis, hematochezia, vomiting, or acute GI distress Musculoskeletal: Denies any acute onset joint swelling, redness, loss of ROM, or weakness Neurological: No reported episodes of acute onset apraxia, aphasia, dysarthria, agnosia, amnesia, paralysis, loss of coordination, or loss of consciousness  Allergies  Mr. Evilsizer has No Known Allergies.  PFSH  Drug: Mr. Scallon  reports that he does not use drugs. Alcohol:  reports that he drinks alcohol. Tobacco:  reports that he has quit smoking. He quit after 25.00 years of use. He has quit using smokeless tobacco. Medical:  has a past medical history of Benign esophageal stricture; Cardiomyopathy, secondary ([place]); Coronary artery disease; Diverticulitis; Gout; Hemorrhoids; Hyperlipidemia; Hypertension; Sleep apnea; and Tubular adenoma of colon. Family: family history includes Heart disease in his mother; Hypertension in his father.  Past Surgical History:  Procedure Laterality Date  . cardiac bypass    . CARDIAC CATHETERIZATION Left 02/03/2016   Procedure: Left Heart Cath and Coronary Angiography;  Surgeon: Teodoro Spray, MD;  Location: [place] [facility];  Service: Cardiovascular;  Laterality: Left;  . CARDIAC SURGERY    . COLONOSCOPY WITH PROPOFOL N/A 04/29/2015   Procedure: COLONOSCOPY WITH PROPOFOL;  Surgeon: Hulen Luster, MD;  Location[facility] ENDOSCOPY;  Service: Gastroenterology;  Laterality: N/A;  . CORONARY ANGIOPLASTY WITH STENT PLACEMENT     x2  . CORONARY ARTERY BYPASS GRAFT    . ESOPHAGOGASTRODUODENOSCOPY (EGD) WITH PROPOFOL N/A 04/29/2015   Procedure: ESOPHAGOGASTRODUODENOSCOPY (EGD) WITH PROPOFOL;  Surgeon: Hulen Luster, MD;  Location: [facility] ENDOSCOPY;  Service: Gastroenterology;  Laterality: N/A;  . SEPTOPLASTY     Constitutional Exam  General appearance: Well nourished, well developed, and well hydrated. In no apparent acute distress Vitals:   11/08/16 1027  BP: 110/78  Pulse: 69  Temp: 97.7 F (36.5 C)  SpO2: 94%   Weight: 165 lb (74.8 kg)  Height: '5\' 3"'  (1.6 m)   BMI Assessment: Estimated body mass index is 29.23 kg/m as calculated from the following:   Height as of this encounter: '5\' 3"'  (1.6 m).   Weight as of this encounter: 165 lb (74.8 kg).  BMI interpretation table: BMI level Category Range association with higher incidence of chronic pain  <18 kg/m2 Underweight   18.5-24.9 kg/m2 Ideal body weight   25-29.9 kg/m2 Overweight Increased incidence by 20%  30-34.9 kg/m2 Obese (Class I) Increased incidence by 68%  35-39.9 kg/m2 Severe obesity (Class II) Increased incidence by 136%  >40 kg/m2 Extreme obesity (Class III) Increased incidence by 254%   BMI Readings from Last 4 Encounters:  11/08/16 29.23 kg/m  09/12/16 29.76 kg/m  08/16/16 29.23 kg/m  05/28/16 27.28 kg/m  Wt Readings from Last 4 Encounters:  11/08/16 165 lb (74.8 kg)  09/12/16 168 lb (76.2 kg)  08/16/16 165 lb (74.8 kg)  05/28/16 154 lb (69.9 kg)  Psych/Mental status: Alert, oriented x 3 (person, place, & time)       Eyes: PERLA Respiratory: No evidence of acute respiratory distress  Cervical Spine Exam  Inspection: No masses, redness, or swelling Alignment: Symmetrical Functional ROM: Unrestricted ROM Stability: No instability detected Muscle strength & Tone: Functionally intact Sensory: Unimpaired Palpation: Non-contributory  Upper Extremity (UE) Exam    Side: Right upper extremity  Side: Left upper extremity  Inspection: No masses, redness, swelling, or asymmetry. No contractures  Inspection: No masses, redness, swelling, or asymmetry. No contractures  Functional ROM: Unrestricted ROM          Functional ROM: Unrestricted ROM          Muscle strength & Tone: Functionally intact  Muscle strength & Tone: Functionally intact  Sensory: Unimpaired  Sensory: Unimpaired  Palpation: Euthermic  Palpation: Euthermic  Specialized Test(s): Deferred         Specialized Test(s): Deferred          Thoracic Spine Exam   Inspection: No masses, redness, or swelling Alignment: Symmetrical Functional ROM: Unrestricted ROM Stability: No instability detected Sensory: Unimpaired Muscle strength & Tone: Functionally intact Palpation: Non-contributory  Lumbar Spine Exam  Inspection: No masses, redness, or swelling Alignment: Symmetrical Functional ROM: Unrestricted ROM Stability: No instability detected Muscle strength & Tone: Functionally intact Sensory: Unimpaired Palpation: Non-contributory Provocative Tests: Lumbar Hyperextension and rotation test: evaluation deferred today       Patrick's Maneuver: evaluation deferred today              Gait & Posture Assessment  Ambulation: Unassisted Gait: Relatively normal for age and body habitus Posture: WNL   Lower Extremity Exam    Side: Right lower extremity  Side: Left lower extremity  Inspection: No masses, redness, swelling, or asymmetry. No contractures  Inspection: No masses, redness, swelling, or asymmetry. No contractures  Functional ROM: Unrestricted ROM          Functional ROM: Unrestricted ROM          Muscle strength & Tone: Functionally intact  Muscle strength & Tone: Functionally intact  Sensory: Unimpaired  Sensory: Unimpaired  Palpation: No palpable anomalies  Palpation: No palpable anomalies   Assessment  Primary Diagnosis & Pertinent Problem List: The primary encounter diagnosis was Chronic chest wall pain (Location of Primary Source of Pain) (Incisional Midline) (since 04/22/2012). Diagnoses of Chronic lower extremity pain (Location of Secondary source of pain) (Bilateral) (R>L), Chronic shoulder pain (Location of Tertiary source of pain) (Right), Chronic pain syndrome, Long term prescription opiate use, Opiate use, Neurogenic pain, and Opioid-induced constipation (OIC) were also pertinent to this visit.  Status Diagnosis  Controlled Controlled Controlled 1. Chronic chest wall pain (Location of Primary Source of Pain) (Incisional  Midline) (since 04/22/2012)   2. Chronic lower extremity pain (Location of Secondary source of pain) (Bilateral) (R>L)   3. Chronic shoulder pain (Location of Tertiary source of pain) (Right)   4. Chronic pain syndrome   5. Long term prescription opiate use   6. Opiate use   7. Neurogenic pain   8. Opioid-induced constipation (OIC)      Plan of Care  Pharmacotherapy (Medications Ordered): Meds ordered this encounter  Medications  . HYDROcodone-acetaminophen (NORCO/VICODIN) 5-325 MG tablet    Sig: Take 1 tablet by mouth 2 (two) times daily  as needed for severe pain.    Dispense:  60 tablet    Refill:  0    Patient may have prescription filled one day early if pharmacy is closed on scheduled refill date. Do not fill until: 01/16/17 To last until: 02/15/17  . HYDROcodone-acetaminophen (NORCO/VICODIN) 5-325 MG tablet    Sig: Take 1 tablet by mouth 2 (two) times daily as needed for severe pain.    Dispense:  60 tablet    Refill:  0    Patient may have prescription filled one day early if pharmacy is closed on scheduled refill date. Do not fill until: 11/17/16 To last until: 12/17/16  . HYDROcodone-acetaminophen (NORCO/VICODIN) 5-325 MG tablet    Sig: Take 1 tablet by mouth 2 (two) times daily as needed for severe pain.    Dispense:  60 tablet    Refill:  0    Patient may have prescription filled one day early if pharmacy is closed on scheduled refill date. Do not fill until: 12/17/16 To last until: 01/16/17  . gabapentin (NEURONTIN) 600 MG tablet    Sig: Take 1 tablet (600 mg total) by mouth 2 (two) times daily.    Dispense:  180 tablet    Refill:  0    Do not add this medication to the electronic "Automatic Refill" notification system. Patient may have prescription filled one day early if pharmacy is closed on scheduled refill date.  . docusate sodium (COLACE) 100 MG capsule    Sig: Take 2 capsules (200 mg total) by mouth at bedtime as needed for moderate constipation. Do not use  longer than 7 days.    Dispense:  60 capsule    Refill:  PRN    Do not place this medication, or any other prescription from our practice, on "Automatic Refill". Patient may have prescription filled one day early if pharmacy is closed on scheduled refill date.  . bisacodyl (DULCOLAX) 5 MG EC tablet    Sig: Take 2 tablets (10 mg total) by mouth at bedtime as needed for moderate constipation ((Hold for loose stool)).    Dispense:  100 tablet    Refill:  PRN    Do not place this medication, or any other prescription from our practice, on "Automatic Refill". Patient may have prescription filled one day early if pharmacy is closed on scheduled refill date.   New Prescriptions   No medications on file   Medications administered today: Mr. Henton had no medications administered during this visit. Lab-work, procedure(s), and/or referral(s): No orders of the defined types were placed in this encounter.  Imaging and/or referral(s): None  Interventional therapies: Planned, scheduled, and/or pending:   Not at this time.   Considering:   Lumbar facet and sacroiliac joint radiofrequency ablation.   Palliative PRN treatment(s):   Diagnostic left intra-articular hip joint injection under fluoroscopic guidance, with or without sedation.    Provider-requested follow-up: Return in about 3 months (around 02/08/2017) for (Nurse Practitioner) Med-Mgmt, in addition.  No future appointments. Primary Care Physician: Elisabeth Cara, NP Location: [facility] Note by: Kathlen Brunswick. Dossie Arbour, M.D, DABA, DABAPM, DABPM, DABIPP, FIPP Date: 11/08/2016; Time: 8:10 AM  Pain Score Disclaimer: We use the NRS-11 scale. This is a self-reported, subjective measurement of pain severity with only modest accuracy. It is used primarily to identify changes within a particular patient. It must be understood that outpatient pain scales are significantly less accurate that those used for research,  where they can be applied  under ideal controlled circumstances with minimal exposure to variables. In reality, the score is likely to be a combination of pain intensity and pain affect, where pain affect describes the degree of emotional arousal or changes in action readiness caused by the sensory experience of pain. Factors such as social and work situation, setting, emotional state, anxiety levels, expectation, and prior pain experience may influence pain perception and show large inter-individual differences that may also be affected by time variables.  Patient instructions provided during this appointment: Patient Instructions   You were given 3 prescriptions for Hydrocodone today and you have scripts for stool softeners and gabapentin at the pharmacy for pick up.     Pain Score  Introduction: The pain score used by this practice is the Verbal Numerical Rating Scale (VNRS-11). This is an 11-point scale. It is for adults and children 10 years or older. There are significant differences in how the pain score is reported, used, and applied. Forget everything you learned in the past and learn this scoring system.  General Information: The scale should reflect your current level of pain. Unless you are specifically asked for the level of your worst pain, or your average pain. If you are asked for one of these two, then it should be understood that it is over the past 24 hours.  Basic Activities of Daily Living (ADL): Personal hygiene, dressing, eating, transferring, and using restroom.  Instructions: Most patients tend to report their level of pain as a combination of two factors, their physical pain and their psychosocial pain. This last one is also known as "suffering" and it is reflection of how physical pain affects you socially and psychologically. From now on, report them separately. From this point on, when asked to report your pain level, report only your physical pain. Use the following table  for reference.  Pain Clinic Pain Levels (0-5/10)  Pain Level Score Description  No Pain 0   Mild pain 1 Nagging, annoying, but does not interfere with basic activities of daily living (ADL). Patients are able to eat, bathe, get dressed, toileting (being able to get on and off the toilet and perform personal hygiene functions), transfer (move in and out of bed or a chair without assistance), and maintain continence (able to control bladder and bowel functions). Blood pressure and heart rate are unaffected. A normal heart rate for a healthy adult ranges from 60 to 100 bpm (beats per minute).   Mild to moderate pain 2 Noticeable and distracting. Impossible to hide from other people. More frequent flare-ups. Still possible to adapt and function close to normal. It can be very annoying and may have occasional stronger flare-ups. With discipline, patients may get used to it and adapt.   Moderate pain 3 Interferes significantly with activities of daily living (ADL). It becomes difficult to feed, bathe, get dressed, get on and off the toilet or to perform personal hygiene functions. Difficult to get in and out of bed or a chair without assistance. Very distracting. With effort, it can be ignored when deeply involved in activities.   Moderately severe pain 4 Impossible to ignore for more than a few minutes. With effort, patients may still be able to manage work or participate in some social activities. Very difficult to concentrate. Signs of autonomic nervous system discharge are evident: dilated pupils (mydriasis); mild sweating (diaphoresis); sleep interference. Heart rate becomes elevated (>115 bpm). Diastolic blood pressure (lower number) rises above 100 mmHg. Patients find relief in laying down and not  moving.   Severe pain 5 Intense and extremely unpleasant. Associated with frowning face and frequent crying. Pain overwhelms the senses.  Ability to do any activity or maintain social relationships becomes  significantly limited. Conversation becomes difficult. Pacing back and forth is common, as getting into a comfortable position is nearly impossible. Pain wakes you up from deep sleep. Physical signs will be obvious: pupillary dilation; increased sweating; goosebumps; brisk reflexes; cold, clammy hands and feet; nausea, vomiting or dry heaves; loss of appetite; significant sleep disturbance with inability to fall asleep or to remain asleep. When persistent, significant weight loss is observed due to the complete loss of appetite and sleep deprivation.  Blood pressure and heart rate becomes significantly elevated. Caution: If elevated blood pressure triggers a pounding headache associated with blurred vision, then the patient should immediately seek attention at an urgent or emergency care unit, as these may be signs of an impending stroke.    Emergency Department Pain Levels (6-10/10)  Emergency Room Pain 6 Severely limiting. Requires emergency care and should not be seen or managed at an outpatient pain management facility. Communication becomes difficult and requires great effort. Assistance to reach the emergency department may be required. Facial flushing and profuse sweating along with potentially dangerous increases in heart rate and blood pressure will be evident.   Distressing pain 7 Self-care is very difficult. Assistance is required to transport, or use restroom. Assistance to reach the emergency department will be required. Tasks requiring coordination, such as bathing and getting dressed become very difficult.   Disabling pain 8 Self-care is no longer possible. At this level, pain is disabling. The individual is unable to do even the most "basic" activities such as walking, eating, bathing, dressing, transferring to a bed, or toileting. Fine motor skills are lost. It is difficult to think clearly.   Incapacitating pain 9 Pain becomes incapacitating. Thought processing is no longer possible.  Difficult to remember your own name. Control of movement and coordination are lost.   The worst pain imaginable 10 At this level, most patients pass out from pain. When this level is reached, collapse of the autonomic nervous system occurs, leading to a sudden drop in blood pressure and heart rate. This in turn results in a temporary and dramatic drop in blood flow to the brain, leading to a loss of consciousness. Fainting is one of the body's self defense mechanisms. Passing out puts the brain in a calmed state and causes it to shut down for a while, in order to begin the healing process.    Summary: 1. Refer to this scale when providing [place] with your pain level. 2. Be accurate and careful when reporting your pain level. This will help with your care. 3. Over-reporting your pain level will lead to loss of credibility. 4. Even a level of 1/10 means that there is pain and will be treated at our facility. 5. High, inaccurate reporting will be documented as "Symptom Exaggeration", leading to loss of credibility and suspicions of possible secondary gains such as obtaining more narcotics, or wanting to appear disabled, for fraudulent reasons. 6. Only pain levels of 5 or below will be seen at our facility. 7. Pain levels of 6 and above will be sent to the Emergency Department and the appointment cancelled. _____________________________________________________________________________________________

## 2016-11-08 NOTE — Progress Notes (Signed)
Nursing Pain Medication Assessment:  Safety precautions to be maintained throughout the outpatient stay will include: orient to surroundings, keep bed in low position, maintain call bell within reach at all times, provide assistance with transfer out of bed and ambulation.  Medication Inspection Compliance: Pill count conducted under aseptic conditions, in front of the patient. Neither the pills nor the bottle was removed from the patient's sight at any time. Once count was completed pills were immediately returned to the patient in their original bottle.  Medication: Hydrocodone/APAP Pill/Patch Count: 22 of 60 pills remain Bottle Appearance: Standard pharmacy container. Clearly labeled. Filled Date:02 / 16 / 2018 Last Medication intake:  Today

## 2016-11-26 ENCOUNTER — Telehealth: Payer: Self-pay | Admitting: *Deleted

## 2016-12-04 ENCOUNTER — Emergency Department: Payer: Medicare Other

## 2016-12-04 ENCOUNTER — Inpatient Hospital Stay
Admission: EM | Admit: 2016-12-04 | Discharge: 2016-12-06 | DRG: 392 | Disposition: A | Discharge status: Home / Self Care | Payer: Medicare Other | Attending: Internal Medicine | Admitting: Internal Medicine

## 2016-12-04 DIAGNOSIS — I10 Essential (primary) hypertension: Secondary | ICD-10-CM | POA: Diagnosis present

## 2016-12-04 DIAGNOSIS — G4730 Sleep apnea, unspecified: Secondary | ICD-10-CM | POA: Diagnosis present

## 2016-12-04 DIAGNOSIS — Z87891 Personal history of nicotine dependence: Secondary | ICD-10-CM | POA: Diagnosis not present

## 2016-12-04 DIAGNOSIS — I25119 Atherosclerotic heart disease of native coronary artery with unspecified angina pectoris: Secondary | ICD-10-CM | POA: Diagnosis present

## 2016-12-04 DIAGNOSIS — I252 Old myocardial infarction: Secondary | ICD-10-CM | POA: Diagnosis not present

## 2016-12-04 DIAGNOSIS — G894 Chronic pain syndrome: Secondary | ICD-10-CM | POA: Diagnosis present

## 2016-12-04 DIAGNOSIS — Z7982 Long term (current) use of aspirin: Secondary | ICD-10-CM | POA: Diagnosis not present

## 2016-12-04 DIAGNOSIS — Z951 Presence of aortocoronary bypass graft: Secondary | ICD-10-CM

## 2016-12-04 DIAGNOSIS — A09 Infectious gastroenteritis and colitis, unspecified: Secondary | ICD-10-CM | POA: Diagnosis present

## 2016-12-04 DIAGNOSIS — K922 Gastrointestinal hemorrhage, unspecified: Secondary | ICD-10-CM

## 2016-12-04 DIAGNOSIS — K5900 Constipation, unspecified: Secondary | ICD-10-CM | POA: Diagnosis present

## 2016-12-04 DIAGNOSIS — Z79899 Other long term (current) drug therapy: Secondary | ICD-10-CM

## 2016-12-04 DIAGNOSIS — E785 Hyperlipidemia, unspecified: Secondary | ICD-10-CM | POA: Diagnosis present

## 2016-12-04 DIAGNOSIS — K529 Noninfective gastroenteritis and colitis, unspecified: Secondary | ICD-10-CM | POA: Diagnosis not present

## 2016-12-04 DIAGNOSIS — Z955 Presence of coronary angioplasty implant and graft: Secondary | ICD-10-CM | POA: Diagnosis not present

## 2016-12-04 DIAGNOSIS — K625 Hemorrhage of anus and rectum: Secondary | ICD-10-CM | POA: Diagnosis present

## 2016-12-04 LAB — COMPREHENSIVE METABOLIC PANEL
ALBUMIN: 4.7 g/dL (ref 3.5–5.0)
ALT: 55 U/L (ref 17–63)
AST: 63 U/L — ABNORMAL HIGH (ref 15–41)
Alkaline Phosphatase: 62 U/L (ref 38–126)
Anion gap: 11 (ref 5–15)
BUN: 19 mg/dL (ref 6–20)
CALCIUM: 9.3 mg/dL (ref 8.9–10.3)
CHLORIDE: 97 mmol/L — AB (ref 101–111)
CO2: 24 mmol/L (ref 22–32)
CREATININE: 1.08 mg/dL (ref 0.61–1.24)
GFR calc Af Amer: >60 mL/min (ref >60)
GLUCOSE: 197 mg/dL — AB (ref 65–99)
Potassium: 4 mmol/L (ref 3.5–5.1)
Sodium: 132 mmol/L — ABNORMAL LOW (ref 135–145)
Total Bilirubin: 1.1 mg/dL (ref 0.3–1.2)
Total Protein: 8.4 g/dL — ABNORMAL HIGH (ref 6.5–8.1)

## 2016-12-04 LAB — GASTROINTESTINAL PANEL BY PCR, STOOL (REPLACES STOOL CULTURE)
Adenovirus F40/41: NOT DETECTED
Astrovirus: NOT DETECTED
CYCLOSPORA CAYETANENSIS: NOT DETECTED
Campylobacter species: NOT DETECTED
Cryptosporidium: NOT DETECTED
ENTAMOEBA HISTOLYTICA: NOT DETECTED
Enteroaggregative E coli (EAEC): NOT DETECTED
Enteropathogenic E coli (EPEC): NOT DETECTED
Enterotoxigenic E coli (ETEC): NOT DETECTED
Giardia lamblia: NOT DETECTED
Norovirus GI/GII: NOT DETECTED
Plesimonas shigelloides: NOT DETECTED
Rotavirus A: NOT DETECTED
SALMONELLA SPECIES: NOT DETECTED
SAPOVIRUS (I, II, IV, AND V): NOT DETECTED
Shiga like toxin producing E coli (STEC): NOT DETECTED
Shigella/Enteroinvasive E coli (EIEC): NOT DETECTED
VIBRIO SPECIES: NOT DETECTED
Vibrio cholerae: NOT DETECTED
Yersinia enterocolitica: NOT DETECTED

## 2016-12-04 LAB — CBC
HCT: 44.3 % (ref 40.0–52.0)
Hemoglobin: 15.2 g/dL (ref 13.0–18.0)
MCH: 31 pg (ref 26.0–34.0)
MCHC: 34.4 g/dL (ref 32.0–36.0)
MCV: 90.1 fL (ref 80.0–100.0)
PLATELETS: 222 10*3/uL (ref 150–440)
RBC: 4.92 MIL/uL (ref 4.40–5.90)
RDW: 14.1 % (ref 11.5–14.5)
WBC: 11.8 10*3/uL — ABNORMAL HIGH (ref 3.8–10.6)

## 2016-12-04 LAB — C DIFFICILE QUICK SCREEN W PCR REFLEX
C Diff antigen: NEGATIVE
C Diff interpretation: NOT DETECTED
C Diff toxin: NEGATIVE

## 2016-12-04 LAB — HEMOGLOBIN: HEMOGLOBIN: 14 g/dL (ref 13.0–18.0)

## 2016-12-04 LAB — ABO/RH: ABO/RH(D): O POS

## 2016-12-04 MED ORDER — IOPAMIDOL (ISOVUE-300) INJECTION 61%
100.0000 mL | Freq: Once | INTRAVENOUS | Status: AC | PRN
  Administered 2016-12-04: 100 mL via INTRAVENOUS

## 2016-12-04 MED ORDER — ONDANSETRON HCL 4 MG/2ML IJ SOLN: 4.0000 mg | Freq: Four times a day (QID) | INTRAMUSCULAR | Status: DC | PRN

## 2016-12-04 MED ORDER — ISOSORBIDE MONONITRATE ER 30 MG PO TB24
30.0000 mg | ORAL_TABLET | ORAL | Status: DC
  Administered 2016-12-05 – 2016-12-06 (×2): 30 mg via ORAL
  Filled 2016-12-04 (×2): qty 1

## 2016-12-04 MED ORDER — METOPROLOL TARTRATE 50 MG PO TABS
50.0000 mg | ORAL_TABLET | Freq: Three times a day (TID) | ORAL | Status: DC
  Administered 2016-12-05 – 2016-12-06 (×5): 50 mg via ORAL
  Filled 2016-12-04 (×5): qty 1

## 2016-12-04 MED ORDER — ONDANSETRON HCL 4 MG PO TABS: 4.0000 mg | ORAL_TABLET | Freq: Four times a day (QID) | ORAL | Status: DC | PRN

## 2016-12-04 MED ORDER — HYDROCODONE-ACETAMINOPHEN 5-325 MG PO TABS
1.0000 | ORAL_TABLET | ORAL | Status: DC | PRN
  Administered 2016-12-04 – 2016-12-05 (×2): 1 via ORAL
  Administered 2016-12-05 (×2): 2 via ORAL
  Filled 2016-12-04: qty 2
  Filled 2016-12-04: qty 1
  Filled 2016-12-04: qty 2
  Filled 2016-12-04: qty 1

## 2016-12-04 MED ORDER — ACETAMINOPHEN 325 MG PO TABS: 650.0000 mg | ORAL_TABLET | Freq: Four times a day (QID) | ORAL | Status: DC | PRN

## 2016-12-04 MED ORDER — ATORVASTATIN CALCIUM 20 MG PO TABS
40.0000 mg | ORAL_TABLET | Freq: Every day | ORAL | Status: DC
  Administered 2016-12-05 (×2): 40 mg via ORAL
  Filled 2016-12-04 (×3): qty 2

## 2016-12-04 MED ORDER — ALBUTEROL SULFATE (2.5 MG/3ML) 0.083% IN NEBU: 2.5000 mg | INHALATION_SOLUTION | RESPIRATORY_TRACT | Status: DC | PRN

## 2016-12-04 MED ORDER — SODIUM CHLORIDE 0.9 % IV SOLN
250.0000 mL | INTRAVENOUS | Status: DC | PRN
Start: 2016-12-04 — End: 2016-12-06

## 2016-12-04 MED ORDER — CIPROFLOXACIN IN D5W 400 MG/200ML IV SOLN
400.0000 mg | Freq: Two times a day (BID) | INTRAVENOUS | Status: DC
  Administered 2016-12-05 – 2016-12-06 (×3): 400 mg via INTRAVENOUS
  Filled 2016-12-04 (×5): qty 200

## 2016-12-04 MED ORDER — PIPERACILLIN-TAZOBACTAM 3.375 G IVPB 30 MIN: 3.3750 g | Freq: Once | INTRAVENOUS | Status: DC

## 2016-12-04 MED ORDER — GABAPENTIN 600 MG PO TABS
600.0000 mg | ORAL_TABLET | Freq: Two times a day (BID) | ORAL | Status: DC
  Administered 2016-12-04 – 2016-12-06 (×4): 600 mg via ORAL
  Filled 2016-12-04 (×4): qty 1

## 2016-12-04 MED ORDER — ACETAMINOPHEN 650 MG RE SUPP: 650.0000 mg | Freq: Four times a day (QID) | RECTAL | Status: DC | PRN

## 2016-12-04 MED ORDER — POLYETHYLENE GLYCOL 3350 17 G PO PACK: 17.0000 g | PACK | Freq: Every day | ORAL | Status: DC | PRN

## 2016-12-04 MED ORDER — ALLOPURINOL 100 MG PO TABS
300.0000 mg | ORAL_TABLET | Freq: Every day | ORAL | Status: DC
  Administered 2016-12-05 – 2016-12-06 (×2): 300 mg via ORAL
  Filled 2016-12-04 (×3): qty 3

## 2016-12-04 MED ORDER — METOPROLOL TARTRATE 50 MG PO TABS: 50.0000 mg | ORAL_TABLET | Freq: Three times a day (TID) | ORAL | Status: DC

## 2016-12-04 MED ORDER — PIPERACILLIN-TAZOBACTAM 3.375 G IVPB
3.3750 g | Freq: Three times a day (TID) | INTRAVENOUS | Status: DC
  Administered 2016-12-05: 03:00:00 3.375 g via INTRAVENOUS
  Filled 2016-12-04 (×2): qty 50

## 2016-12-04 MED ORDER — SODIUM CHLORIDE 0.9% FLUSH: 3.0000 mL | Freq: Two times a day (BID) | INTRAVENOUS | Status: DC

## 2016-12-04 MED ORDER — IOPAMIDOL (ISOVUE-300) INJECTION 61%
30.0000 mL | Freq: Once | INTRAVENOUS | Status: AC | PRN
  Administered 2016-12-04: 30 mL via ORAL

## 2016-12-04 MED ORDER — SODIUM CHLORIDE 0.9% FLUSH: 3.0000 mL | INTRAVENOUS | Status: DC | PRN

## 2016-12-04 MED ORDER — HYDROCHLOROTHIAZIDE 25 MG PO TABS
25.0000 mg | ORAL_TABLET | Freq: Every day | ORAL | Status: DC
  Administered 2016-12-05 – 2016-12-06 (×2): 25 mg via ORAL
  Filled 2016-12-04 (×2): qty 1

## 2016-12-04 MED ORDER — METRONIDAZOLE 500 MG PO TABS
500.0000 mg | ORAL_TABLET | Freq: Three times a day (TID) | ORAL | Status: DC
  Administered 2016-12-04 – 2016-12-06 (×5): 500 mg via ORAL
  Filled 2016-12-04 (×5): qty 1

## 2016-12-04 MED ORDER — SODIUM CHLORIDE 0.9% FLUSH
3.0000 mL | Freq: Two times a day (BID) | INTRAVENOUS | Status: DC
  Administered 2016-12-04 – 2016-12-06 (×4): 3 mL via INTRAVENOUS

## 2016-12-04 MED ORDER — SODIUM CHLORIDE 0.9 % IV BOLUS (SEPSIS)
1000.0000 mL | Freq: Once | INTRAVENOUS | Status: AC
  Administered 2016-12-04: 1000 mL via INTRAVENOUS

## 2016-12-04 NOTE — ED Provider Notes (Signed)
[facility] Emergency Department Provider Note  ____________________________________________   First MD Initiated Contact with Patient 12/04/16 1656     (approximate)  I have reviewed the triage vital signs and the nursing notes.   HISTORY  Chief Complaint Rectal Bleeding   HPI [name] [name]. is a 61 y.o. male with a history of coronary artery disease on aspirin who is presenting to the emergency department today with on and off constipation over the last week and now multiple episodes of bloody diarrhea today. He says that he also has intermittent abdominal cramping to the left lower quadrant but also intermittently diffusely to the abdomen. He denies any pain at this time. No chest pain or shortness of breath. No known sick contacts.   Past Medical History:  Diagnosis Date  . Benign esophageal stricture   . Cardiomyopathy, secondary ([place])   . Coronary artery disease   . Diverticulitis   . Gout   . Hemorrhoids   . Hyperlipidemia   . Hypertension   . Sleep apnea   . Tubular adenoma of colon     Patient Active Problem List   Diagnosis Date Noted  . Chronic pain syndrome 08/16/2016  . Pneumonia 05/28/2016  . Pressure injury of skin 05/28/2016  . Opioid-induced constipation (OIC) 05/17/2016  . Hypokalemia 05/01/2016  . 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 Carotid atherosclerosis 04/04/2016  . Hypoalbuminemia 04/04/2016  . Right sided weakness 04/02/2016  . S/P CABG (coronary artery bypass graft) 03/30/2016  . Chest pain 02/29/2016  . Unstable angina ([place]) 02/24/2016  . Keloid skin disorder 01/17/2016  . Costochondritis 01/17/2016  . Long term current use of opiate analgesic 12/23/2015  . Long term prescription opiate use 12/23/2015  . Vitamin D insufficiency 11/21/2015  . Lumbar facet syndrome (Bilateral) (R>L) 11/21/2015  . Lumbar spondylosis 11/21/2015  . Elevated sedimentation rate 11/01/2015  . Chronic low back pain  (Bilateral) (R>L) 10/25/2015  . Chronic shoulder pain (Location of Tertiary source of pain) (Right) 10/25/2015  . Chronic hip pain (Left) 10/25/2015  . History of alcoholism ([place]) 10/25/2015  . Opiate use 10/25/2015  . Encounter for therapeutic drug level monitoring 10/25/2015  . Chronic chest wall pain (Location of Primary Source of Pain) (Incisional Midline) (since 04/22/2012) 10/25/2015  . Chronic lower extremity pain (Location of Secondary source of pain) (Bilateral) (R>L) 10/25/2015  . History of MI (myocardial infarction) (January 2014) 10/25/2015  . Encounter for pain management planning 10/25/2015  . Chronic sacroiliac joint pain (Bilateral) (L>R) 10/25/2015  . Neuropathic pain 10/25/2015  . Neurogenic pain 10/25/2015  . Incisional pain 10/25/2015  . HLD (hyperlipidemia) 03/16/2014  . H/O coronary artery bypass surgery 05/12/2012  . Alcohol withdrawal syndrome ([place]) 04/28/2012  . Injury of kidney 04/25/2012  . Obstructive apnea 04/22/2012  . BP (high blood pressure) 04/22/2012  . Arteriosclerosis of coronary artery 04/22/2012  . Apnea, sleep 04/21/2012  . Chronic obstructive pulmonary disease ([place]) 04/21/2012  . COPD (chronic obstructive pulmonary disease) ([place]) 04/21/2012    Past Surgical History:  Procedure Laterality Date  . cardiac bypass    . CARDIAC CATHETERIZATION Left 02/03/2016   Procedure: Left Heart Cath and Coronary Angiography;  Surgeon: Teodoro Spray, MD;  Location: [facility];  Service: Cardiovascular;  Laterality: Left;  . CARDIAC SURGERY    . COLONOSCOPY WITH PROPOFOL N/A 04/29/2015   Procedure: COLONOSCOPY WITH PROPOFOL;  Surgeon: Hulen Luster, MD;  Location: [facility] ENDOSCOPY;  Service: Gastroenterology;  Laterality: N/A;  .  CORONARY ANGIOPLASTY WITH STENT PLACEMENT     x2  . CORONARY ARTERY BYPASS GRAFT    . ESOPHAGOGASTRODUODENOSCOPY (EGD) WITH PROPOFOL N/A 04/29/2015   Procedure: ESOPHAGOGASTRODUODENOSCOPY (EGD) WITH PROPOFOL;  Surgeon: Hulen Luster,  MD;  Location: [facility] ENDOSCOPY;  Service: Gastroenterology;  Laterality: N/A;  . SEPTOPLASTY      Prior to Admission medications   Medication Sig Start Date End Date Taking? Authorizing Provider  allopurinol (ZYLOPRIM) 300 MG tablet Take 300 mg by mouth daily. *patient takes in the afternoon*   Yes Historical Provider, MD  amLODipine (NORVASC) 10 MG tablet Take 10 mg by mouth daily.  07/23/16  Yes Historical Provider, MD  aspirin EC 81 MG tablet Take 81 mg by mouth every morning.   Yes Historical Provider, MD  atorvastatin (LIPITOR) 40 MG tablet Take 40 mg by mouth at bedtime.    Yes Historical Provider, MD  Cholecalciferol (VITAMIN D3) 2000 units TABS Take 2,000 Units by mouth every morning.   Yes Historical Provider, MD  gabapentin (NEURONTIN) 600 MG tablet Take 1 tablet (600 mg total) by mouth 2 (two) times daily. 11/17/16 02/15/17 Yes Milinda Pointer, MD  hydrochlorothiazide (HYDRODIURIL) 25 MG tablet Take 25 mg by mouth daily.  07/23/16  Yes Historical Provider, MD  HYDROcodone-acetaminophen (NORCO/VICODIN) 5-325 MG tablet Take 1 tablet by mouth 2 (two) times daily as needed for severe pain. 11/17/16 12/17/16 Yes Milinda Pointer, MD  isosorbide mononitrate (IMDUR) 30 MG 24 hr tablet Take 30 mg by mouth every morning.    Yes Historical Provider, MD  losartan (COZAAR) 100 MG tablet take 1 tablet by mouth once daily 08/02/16  Yes Historical Provider, MD  metoprolol (LOPRESSOR) 50 MG tablet Take 50 mg by mouth 3 (three) times daily.   Yes Historical Provider, MD  Multiple Vitamin (MULTIVITAMIN WITH MINERALS) TABS tablet Take 1 tablet by mouth every evening.   Yes Historical Provider, MD  nitroGLYCERIN (NITROSTAT) 0.4 MG SL tablet Place 0.4 mg under the tongue every 5 (five) minutes x 3 doses as needed for chest pain.  03/07/16  Yes Historical Provider, MD  Potassium 95 MG TABS Take 95 mg by mouth every morning.    Yes Historical Provider, MD  bisacodyl (DULCOLAX) 5 MG EC tablet Take 2 tablets (10  mg total) by mouth at bedtime as needed for moderate constipation ((Hold for loose stool)). Patient not taking: Reported on 12/04/2016 11/17/16 02/15/17  Milinda Pointer, MD  Blood Glucose Monitoring Suppl (FIFTY50 GLUCOSE METER 2.0) w/Device KIT Use as directed. Product selection permitted according to insurance preference. E11.9 Type 2 diabetes mellitus 04/09/16 04/09/17  Historical Provider, MD  docusate sodium (COLACE) 100 MG capsule Take 2 capsules (200 mg total) by mouth at bedtime as needed for moderate constipation. Do not use longer than 7 days. Patient not taking: Reported on 12/04/2016 11/17/16 02/15/17  Milinda Pointer, MD  HYDROcodone-acetaminophen (NORCO/VICODIN) 5-325 MG tablet Take 1 tablet by mouth 2 (two) times daily as needed for severe pain. 01/16/17 02/15/17  Milinda Pointer, MD  HYDROcodone-acetaminophen (NORCO/VICODIN) 5-325 MG tablet Take 1 tablet by mouth 2 (two) times daily as needed for severe pain. 12/17/16 01/16/17  Milinda Pointer, MD  Lancet Devices ([employer] LANCING DEVICE) MISC Use 1 each 3 (three) times daily. Product selection permitted according to insurance preference. E11.9 Type 2 diabetes mellitus 04/09/16 04/09/17  Historical Provider, MD    Allergies Patient has no known allergies.  Family History  Problem Relation Age of Onset  . Heart disease Mother   .  Hypertension Father     Social History Social History  Substance Use Topics  . Smoking status: Former Smoker    Years: 25.00  . Smokeless tobacco: Former Systems developer  . Alcohol use Yes     Comment: occasional    Review of Systems Constitutional: No fever/chills Eyes: No visual changes. ENT: No sore throat. Cardiovascular: Denies chest pain. Respiratory: Denies shortness of breath. Gastrointestinal: no vomiting. No constipation. Genitourinary: Negative for dysuria. Musculoskeletal: Negative for back pain. Skin: Negative for rash. Neurological: Negative for headaches, focal weakness or numbness.  10-point ROS  otherwise negative.  ____________________________________________   PHYSICAL EXAM:  VITAL SIGNS: ED Triage Vitals [12/04/16 1517]  Enc Vitals Group     BP 124/68     Pulse Rate 87     Resp 20     Temp 97.8 F (36.6 C)     Temp Source Oral     SpO2 96 %     Weight 170 lb (77.1 kg)     Height '5\' 3"'  (1.6 m)     Head Circumference      Peak Flow      Pain Score      Pain Loc      Pain Edu?      Excl. in [place]?     Constitutional: Alert and oriented. Well appearing and in no acute distress. Eyes: Conjunctivae are normal. PERRL. EOMI. Head: Atraumatic. Nose: No congestion/rhinnorhea. Mouth/Throat: Mucous membranes are moist.   Neck: No stridor.   Cardiovascular: Normal rate, regular rhythm. Grossly normal heart sounds.  Respiratory: Normal respiratory effort.  No retractions. Lungs CTAB. Gastrointestinal: Soft and nontender. No distention.  Grossly bloody, liquid stool in the hat in the toilet. Patient with his initial bowel movement which appears to be about 50-60 MLS.  Musculoskeletal: No lower extremity tenderness nor edema.  No joint effusions. Neurologic:  Normal speech and language. No gross focal neurologic deficits are appreciated. No gait instability. Skin:  Skin is warm, dry and intact. No rash noted. Psychiatric: Mood and affect are normal. Speech and behavior are normal.  ____________________________________________   LABS (all labs ordered are listed, but only abnormal results are displayed)  Labs Reviewed  COMPREHENSIVE METABOLIC PANEL - Abnormal; Notable for the following:       Result Value   Sodium 132 (*)    Chloride 97 (*)    Glucose, Bld 197 (*)    Total Protein 8.4 (*)    AST 63 (*)    All other components within normal limits  CBC - Abnormal; Notable for the following:    WBC 11.8 (*)    All other components within normal limits  C DIFFICILE QUICK SCREEN W PCR REFLEX  GASTROINTESTINAL PANEL BY PCR, STOOL (REPLACES STOOL CULTURE)  TYPE AND  SCREEN  ABO/RH   ____________________________________________  EKG   ____________________________________________  RADIOLOGY  CT Abdomen Pelvis W Contrast (Final result)  Result time 12/04/16 19:01:54  Final result by Massie Kluver, MD (12/04/16 19:01:54)           Narrative:   CLINICAL DATA: Left lower quadrant pain with intermittent constipation and diarrhea for the past week. Today having bright red blood in stool.  EXAM: CT ABDOMEN AND PELVIS WITH CONTRAST  TECHNIQUE: Multidetector CT imaging of the abdomen and pelvis was performed using the standard protocol following bolus administration of intravenous contrast.  CONTRAST: 151m ISOVUE-300 IOPAMIDOL (ISOVUE-300) INJECTION 61%  COMPARISON: 03/20/2015 CT  FINDINGS: Lower chest: Normal size cardiac chambers. Minimal  scarring at the left lung base and right middle lobe. No effusion or pulmonary consolidation.  Hepatobiliary: Hepatic steatosis. Normal gallbladder.  Pancreas: Unremarkable  Spleen: Unremarkable  Adrenals/Urinary Tract: Adrenal glands are unremarkable. Kidneys are normal, without renal calculi, focal lesion, or hydronephrosis. Bladder is unremarkable.  Stomach/Bowel: Diffuse transmural thickening of descending colon through rectum consistent with proctocolitis. No bowel obstruction, perforation or abscess. Duodenal diverticulum however is of incidental note off the second portion, otherwise the small bowel is unremarkable. Normal appendix.  Vascular/Lymphatic: Aortic and branch vessel atherosclerosis without aneurysm or dissection. No enlarged lymph nodes by CT criteria.  Reproductive: Scattered peripheral calcifications of along the corpora cavernosa in the region of the tunica which can be seen with Peyronie disease.  Other: No abdominal wall hernia or abnormality. No abdominopelvic ascites. Fat noted in the left inguinal canal.  Musculoskeletal: Degenerative disc disease L4-5  and L5-S1 with slight retrolisthesis grade 1 of L5 on S1.  IMPRESSION: 1. Diffuse transmural thickening from descending colon through rectum consistent with proctocolitis. 2. Hepatic steatosis. 3. Lower lumbar degenerative disc disease L4-5 and L5-S1. 4. Calcifications along the tunica of penis which can be seen with Peyronie disease.   Electronically Signed By: Ashley Royalty M.D. On: 12/04/2016 19:01            ____________________________________________   PROCEDURES  Procedure(s) performed:   Procedures  Critical Care performed:   ____________________________________________   INITIAL IMPRESSION / ASSESSMENT AND PLAN / ED COURSE  Pertinent labs & imaging results that were available during my care of the patient were reviewed by me and considered in my medical decision making (see chart for details).  ----------------------------------------- 7:46 PM on 12/04/2016 -----------------------------------------  Patient with 2 frankly bloody stools in the emergency Department. No distress at this time. Appears to have fairly extensive proctocolitis. He'll be admitted to the hospital with IV Zosyn. He is aware of this diagnosis and plan. Signed out to Dr. Darvin Neighbours.      ____________________________________________   FINAL CLINICAL IMPRESSION(S) / ED DIAGNOSES  Proctocolitis. GI bleeding.    NEW MEDICATIONS STARTED DURING THIS VISIT:  New Prescriptions   No medications on file     Note:  This document was prepared using Dragon voice recognition software and may include unintentional dictation errors.    Orbie Pyo, MD 12/04/16 [phone]

## 2016-12-04 NOTE — Progress Notes (Signed)
Pharmacy Antibiotic Note  Tymere Depuy. is a 61 y.o. male admitted on 12/04/2016 with IAI.  Pharmacy has been consulted for Zosyn dosing.  Plan: Zosyn 3.375 gm IV Q8H EI  Height: 5\' 3"  (160 cm) Weight: 170 lb (77.1 kg) IBW/kg (Calculated) : 56.9  Temp (24hrs), Avg:97.8 F (36.6 C), Min:97.8 F (36.6 C), Max:97.8 F (36.6 C)   Recent Labs Lab 12/04/16 1539  WBC 11.8*  CREATININE 1.08    Estimated Creatinine Clearance: 64.4 mL/min (by C-G formula based on SCr of 1.08 mg/dL).    No Known Allergies  Thank you for allowing pharmacy to be a part of this patient's care.  Laural Benes, Pharm.D., BCPS Clinical Pharmacist 12/04/2016 7:55 PM

## 2016-12-04 NOTE — ED Triage Notes (Signed)
Pt c/o LLQ pain with intermittent constipation/diarrhea over the past week but today having bright red blood mixed with stool.Marland Kitchen

## 2016-12-04 NOTE — H&P (Signed)
[facility] [place] NAME: [name]    MR#:  [id number]  DATE OF BIRTH:  Jan 23, 1956  DATE OF ADMISSION:  12/04/2016  PRIMARY CARE PHYSICIAN: Elisabeth Cara, NP   REQUESTING/REFERRING PHYSICIAN: Dr. Lucita Lora  CHIEF COMPLAINT:   Chief Complaint  Patient presents with  . Rectal Bleeding    HISTORY OF PRESENT ILLNESS:  [name] [name]  is a 61 y.o. male with a known history of CAD, CKD, chronic pain syndrome, diverticulosis presents to the emergency room complaining of some mild diarrhea, left lower quadrant abdominal pain, rectal bleeding. Patient does not feel lightheaded or dizzy. He had 2 episodes of blood in his stool in the emergency room with significant amount. Hemoglobin is at 15. CT scan of the abdomen and pelvis shows proctocolitis with no abscess. Afebrile. Patient is being admitted for proctocolitis and GI bleed. Takes a baby aspirin.  PAST MEDICAL HISTORY:   Past Medical History:  Diagnosis Date  . Benign esophageal stricture   . Cardiomyopathy, secondary ([place])   . Coronary artery disease   . Diverticulitis   . Gout   . Hemorrhoids   . Hyperlipidemia   . Hypertension   . Sleep apnea   . Tubular adenoma of colon     PAST SURGICAL HISTORY:   Past Surgical History:  Procedure Laterality Date  . cardiac bypass    . CARDIAC CATHETERIZATION Left 02/03/2016   Procedure: Left Heart Cath and Coronary Angiography;  Surgeon: Teodoro Spray, MD;  Location: [facility];  Service: Cardiovascular;  Laterality: Left;  . CARDIAC SURGERY    . COLONOSCOPY WITH PROPOFOL N/A 04/29/2015   Procedure: COLONOSCOPY WITH PROPOFOL;  Surgeon: Hulen Luster, MD;  Location: [facility] ENDOSCOPY;  Service: Gastroenterology;  Laterality: N/A;  . CORONARY ANGIOPLASTY WITH STENT PLACEMENT     x2  . CORONARY ARTERY BYPASS GRAFT    . ESOPHAGOGASTRODUODENOSCOPY (EGD) WITH PROPOFOL N/A 04/29/2015   Procedure: ESOPHAGOGASTRODUODENOSCOPY (EGD) WITH PROPOFOL;   Surgeon: Hulen Luster, MD;  Location: [facility] ENDOSCOPY;  Service: Gastroenterology;  Laterality: N/A;  . SEPTOPLASTY      SOCIAL HISTORY:   Social History  Substance Use Topics  . Smoking status: Former Smoker    Years: 25.00  . Smokeless tobacco: Former Systems developer  . Alcohol use Yes     Comment: occasional    FAMILY HISTORY:   Family History  Problem Relation Age of Onset  . Heart disease Mother   . Hypertension Father     DRUG ALLERGIES:  No Known Allergies  REVIEW OF SYSTEMS:   Review of Systems  Constitutional: Negative for chills, fever and weight loss.  HENT: Negative for hearing loss and nosebleeds.   Eyes: Negative for blurred vision, double vision and pain.  Respiratory: Negative for cough, hemoptysis, sputum production, shortness of breath and wheezing.   Cardiovascular: Negative for chest pain, palpitations, orthopnea and leg swelling.  Gastrointestinal: Positive for abdominal pain, blood in stool and diarrhea. Negative for constipation, nausea and vomiting.  Genitourinary: Negative for dysuria and hematuria.  Musculoskeletal: Positive for back pain. Negative for falls and myalgias.  Skin: Negative for rash.  Neurological: Positive for weakness. Negative for dizziness, tremors, sensory change, speech change, focal weakness, seizures and headaches.  Endo/Heme/Allergies: Does not bruise/bleed easily.  Psychiatric/Behavioral: Negative for depression and memory loss. The patient is not nervous/anxious.     MEDICATIONS AT HOME:   Prior to Admission medications   Medication Sig Start Date End  Date Taking? Authorizing Provider  allopurinol (ZYLOPRIM) 300 MG tablet Take 300 mg by mouth daily. *patient takes in the afternoon*   Yes Historical Provider, MD  amLODipine (NORVASC) 10 MG tablet Take 10 mg by mouth daily.  07/23/16  Yes Historical Provider, MD  aspirin EC 81 MG tablet Take 81 mg by mouth every morning.   Yes Historical Provider, MD  atorvastatin (LIPITOR) 40 MG  tablet Take 40 mg by mouth at bedtime.    Yes Historical Provider, MD  Cholecalciferol (VITAMIN D3) 2000 units TABS Take 2,000 Units by mouth every morning.   Yes Historical Provider, MD  gabapentin (NEURONTIN) 600 MG tablet Take 1 tablet (600 mg total) by mouth 2 (two) times daily. 11/17/16 02/15/17 Yes Delano Metz, MD  hydrochlorothiazide (HYDRODIURIL) 25 MG tablet Take 25 mg by mouth daily.  07/23/16  Yes Historical Provider, MD  HYDROcodone-acetaminophen (NORCO/VICODIN) 5-325 MG tablet Take 1 tablet by mouth 2 (two) times daily as needed for severe pain. 11/17/16 12/17/16 Yes Delano Metz, MD  isosorbide mononitrate (IMDUR) 30 MG 24 hr tablet Take 30 mg by mouth every morning.    Yes Historical Provider, MD  losartan (COZAAR) 100 MG tablet take 1 tablet by mouth once daily 08/02/16  Yes Historical Provider, MD  metoprolol (LOPRESSOR) 50 MG tablet Take 50 mg by mouth 3 (three) times daily.   Yes Historical Provider, MD  Multiple Vitamin (MULTIVITAMIN WITH MINERALS) TABS tablet Take 1 tablet by mouth every evening.   Yes Historical Provider, MD  nitroGLYCERIN (NITROSTAT) 0.4 MG SL tablet Place 0.4 mg under the tongue every 5 (five) minutes x 3 doses as needed for chest pain.  03/07/16  Yes Historical Provider, MD  Potassium 95 MG TABS Take 95 mg by mouth every morning.    Yes Historical Provider, MD  bisacodyl (DULCOLAX) 5 MG EC tablet Take 2 tablets (10 mg total) by mouth at bedtime as needed for moderate constipation ((Hold for loose stool)). Patient not taking: Reported on 12/04/2016 11/17/16 02/15/17  Delano Metz, MD  Blood Glucose Monitoring Suppl (FIFTY50 GLUCOSE METER 2.0) w/Device KIT Use as directed. Product selection permitted according to insurance preference. E11.9 Type 2 diabetes mellitus 04/09/16 04/09/17  Historical Provider, MD  docusate sodium (COLACE) 100 MG capsule Take 2 capsules (200 mg total) by mouth at bedtime as needed for moderate constipation. Do not use longer than 7  days. Patient not taking: Reported on 12/04/2016 11/17/16 02/15/17  Delano Metz, MD  HYDROcodone-acetaminophen (NORCO/VICODIN) 5-325 MG tablet Take 1 tablet by mouth 2 (two) times daily as needed for severe pain. 01/16/17 02/15/17  Delano Metz, MD  HYDROcodone-acetaminophen (NORCO/VICODIN) 5-325 MG tablet Take 1 tablet by mouth 2 (two) times daily as needed for severe pain. 12/17/16 01/16/17  Delano Metz, MD  Lancet Devices ([employer] LANCING DEVICE) MISC Use 1 each 3 (three) times daily. Product selection permitted according to insurance preference. E11.9 Type 2 diabetes mellitus 04/09/16 04/09/17  Historical Provider, MD     VITAL SIGNS:  Blood pressure 126/78, pulse 81, temperature 97.8 F (36.6 C), temperature source Oral, resp. rate 19, height 5\' 3"  (1.6 m), weight 77.1 kg (170 lb), SpO2 97 %.  PHYSICAL EXAMINATION:  Physical Exam  GENERAL:  61 y.o.-year-old patient lying in the bed with no acute distress.  EYES: Pupils equal, round, reactive to light and accommodation. No scleral icterus. Extraocular muscles intact.  HEENT: Head atraumatic, normocephalic. Oropharynx and nasopharynx clear. No oropharyngeal erythema, moist oral mucosa  NECK:  Supple, no jugular venous  distention. No thyroid enlargement, no tenderness.  LUNGS: Normal breath sounds bilaterally, no wheezing, rales, rhonchi. No use of accessory muscles of respiration.  CARDIOVASCULAR: S1, S2 normal. No murmurs, rubs, or gallops.  ABDOMEN: Soft, nondistended. Bowel sounds present. No organomegaly or mass. Left lower quadrant tenderness on deep palpation EXTREMITIES: No pedal edema, cyanosis, or clubbing. + 2 pedal & radial pulses b/l.   NEUROLOGIC: Cranial nerves II through XII are intact. No focal Motor or sensory deficits appreciated b/l PSYCHIATRIC: The patient is alert and oriented x 3. Good affect.  SKIN: No obvious rash, lesion, or ulcer.   LABORATORY PANEL:   CBC  Recent Labs Lab 12/04/16 1539  WBC 11.8*  HGB  15.2  HCT 44.3  PLT 222   ------------------------------------------------------------------------------------------------------------------  Chemistries   Recent Labs Lab 12/04/16 1539  NA 132*  K 4.0  CL 97*  CO2 24  GLUCOSE 197*  BUN 19  CREATININE 1.08  CALCIUM 9.3  AST 63*  ALT 55  ALKPHOS 62  BILITOT 1.1   ------------------------------------------------------------------------------------------------------------------  Cardiac Enzymes No results for input(s): TROPONINI in the last 168 hours. ------------------------------------------------------------------------------------------------------------------  RADIOLOGY:  Ct Abdomen Pelvis W Contrast  Result Date: 12/04/2016 CLINICAL DATA:  Left lower quadrant pain with intermittent constipation and diarrhea for the past week. Today having bright red blood in stool. EXAM: CT ABDOMEN AND PELVIS WITH CONTRAST TECHNIQUE: Multidetector CT imaging of the abdomen and pelvis was performed using the standard protocol following bolus administration of intravenous contrast. CONTRAST:  ISOVUE-300 IOPAMIDOL (ISOVUE-300) INJECTION 61% COMPARISON:  03/20/2015 CT FINDINGS: Lower chest: Normal size cardiac chambers. Minimal scarring at the left lung base and right middle lobe. No effusion or pulmonary consolidation. Hepatobiliary: Hepatic steatosis.  Normal gallbladder. Pancreas: Unremarkable Spleen: Unremarkable Adrenals/Urinary Tract: Adrenal glands are unremarkable. Kidneys are normal, without renal calculi, focal lesion, or hydronephrosis. Bladder is unremarkable. Stomach/Bowel: Diffuse transmural thickening of descending colon through rectum consistent with proctocolitis. No bowel obstruction, perforation or abscess. Duodenal diverticulum however is of incidental note off the second portion, otherwise the small bowel is unremarkable. Normal appendix. Vascular/Lymphatic: Aortic and branch vessel atherosclerosis without aneurysm or  dissection. No enlarged lymph nodes by CT criteria. Reproductive: Scattered peripheral calcifications of along the corpora cavernosa in the region of the tunica which can be seen with Peyronie disease. Other: No abdominal wall hernia or abnormality. No abdominopelvic ascites. Fat noted in the left inguinal canal. Musculoskeletal: Degenerative disc disease L4-5 and L5-S1 with slight retrolisthesis grade 1 of L5 on S1. IMPRESSION: 1. Diffuse transmural thickening from descending colon through rectum consistent with proctocolitis. 2. Hepatic steatosis. 3. Lower lumbar degenerative disc disease L4-5 and L5-S1. 4. Calcifications along the tunica of penis which can be seen with Peyronie disease. Electronically Signed   By: Tollie Eth M.D.   On: 12/04/2016 19:01     IMPRESSION AND PLAN:   * Proctocolitis. We'll cover for bacterial infection with IV ciprofloxacin and Flagyl. Clear liquid diet at this time. His GI bleed is most likely due to this. Stable. It has been a few hours in the emergency room. We will repeat a stat hemoglobin. Repeat hemoglobin again in the morning. Transfuse if significant drop or symptomatic. Consult GI. Hold aspirin.  * Hypertension. Continue home medications.  * CAD. Stable. He does have chronic angina which is unchanged. Hold aspirin.  * Chronic pain syndrome. Continue Norco.  * DVT prophylaxis with SCDs    All the records are reviewed and case discussed with ED provider. Management  plans discussed with the patient, family and they are in agreement.  CODE STATUS: Full code  TOTAL TIME TAKING CARE OF THIS PATIENT: 40 minutes.   Hillary Bow R M.D on 12/04/2016 at 8:09 PM  Between 7am to 6pm - Pager - [phone]  After 6pm go to [email or web address] - password [email or web address] [facility] [facility]  Office  [phone]  CC: Primary care physician; Elisabeth Cara, NP  Note: This dictation was prepared with Dragon dictation along with smaller phrase technology.  Any transcriptional errors that result from this process are unintentional.

## 2016-12-05 DIAGNOSIS — K529 Noninfective gastroenteritis and colitis, unspecified: Secondary | ICD-10-CM

## 2016-12-05 LAB — BASIC METABOLIC PANEL
Anion gap: 8 (ref 5–15)
BUN: 15 mg/dL (ref 6–20)
CALCIUM: 8.7 mg/dL — AB (ref 8.9–10.3)
CO2: 25 mmol/L (ref 22–32)
Chloride: 104 mmol/L (ref 101–111)
Creatinine, Ser: 1 mg/dL (ref 0.61–1.24)
GFR calc Af Amer: >60 mL/min (ref >60)
GLUCOSE: 103 mg/dL — AB (ref 65–99)
Potassium: 3.5 mmol/L (ref 3.5–5.1)
Sodium: 137 mmol/L (ref 135–145)

## 2016-12-05 LAB — CBC
HEMATOCRIT: 37 % — AB (ref 40.0–52.0)
Hemoglobin: 13.1 g/dL (ref 13.0–18.0)
MCH: 31.5 pg (ref 26.0–34.0)
MCHC: 35.5 g/dL (ref 32.0–36.0)
MCV: 88.8 fL (ref 80.0–100.0)
Platelets: 175 10*3/uL (ref 150–440)
RBC: 4.17 MIL/uL — ABNORMAL LOW (ref 4.40–5.90)
RDW: 13.8 % (ref 11.5–14.5)
WBC: 8.8 10*3/uL (ref 3.8–10.6)

## 2016-12-05 LAB — GLUCOSE, CAPILLARY: GLUCOSE-CAPILLARY: 113 mg/dL — AB (ref 65–99)

## 2016-12-05 NOTE — Plan of Care (Signed)
Problem: Bowel/Gastric: Goal: Will show no signs and symptoms of gastrointestinal bleeding Outcome: Progressing Pt did have one BM today that was red in color

## 2016-12-05 NOTE — Progress Notes (Signed)
[facility] [place] NAME: [name] [name]    MR#:  [id number]  DATE OF BIRTH:  1956-08-08  SUBJECTIVE:admitted for abdominal cramps and rectal bleeding.rectal bleed resolved.abdominal cramps resolved.hb stable.pt denies any more diarrhoea also.hb stable,  CHIEF COMPLAINT:   Chief Complaint  Patient presents with  . Rectal Bleeding    REVIEW OF SYSTEMS:   Review of Systems  Constitutional: Negative for chills and fever.  HENT: Negative for hearing loss.   Eyes: Negative for blurred vision, double vision and photophobia.  Respiratory: Negative for cough, hemoptysis and shortness of breath.   Cardiovascular: Negative for palpitations, orthopnea and leg swelling.  Gastrointestinal: Negative for abdominal pain, diarrhea and vomiting.  Genitourinary: Negative for dysuria and urgency.  Musculoskeletal: Positive for back pain. Negative for myalgias and neck pain.  Skin: Negative for rash.  Neurological: Positive for weakness. Negative for dizziness, focal weakness, seizures and headaches.  Psychiatric/Behavioral: Negative for memory loss. The patient does not have insomnia.    CONSTITUTIONAL: No fever, fatigue or weakness.  EYES: No blurred or double vision.  EARS, NOSE, AND THROAT: No tinnitus or ear pain.  RESPIRATORY: No cough, shortness of breath, wheezing or hemoptysis.  CARDIOVASCULAR: No chest pain, orthopnea, edema.  GASTROINTESTINAL: No nausea, vomiting, diarrhea or abdominal pain.  GENITOURINARY: No dysuria, hematuria.  ENDOCRINE: No polyuria, nocturia,  HEMATOLOGY: No anemia, easy bruising or bleeding SKIN: No rash or lesion. MUSCULOSKELETAL: No joint pain or arthritis.   NEUROLOGIC: No tingling, numbness, weakness.  PSYCHIATRY: No anxiety or depression.   DRUG ALLERGIES:  No Known Allergies  VITALS:  Blood pressure (!) 96/56, pulse 68, temperature 98.3 F (36.8 C), resp. rate 18, height 5\' 3"  (1.6 m), weight 77.1 kg  (170 lb), SpO2 95 %.  PHYSICAL EXAMINATION:  GENERAL:  61 y.o.-year-old patient lying in the bed with no acute distress.  EYES: Pupils equal, round, reactive to light and accommodation. No scleral icterus. Extraocular muscles intact.  HEENT: Head atraumatic, normocephalic. Oropharynx and nasopharynx clear.  NECK:  Supple, no jugular venous distention. No thyroid enlargement, no tenderness.  LUNGS: Normal breath sounds bilaterally, no wheezing, rales,rhonchi or crepitation. No use of accessory muscles of respiration.  CARDIOVASCULAR: S1, S2 normal. No murmurs, rubs, or gallops.  ABDOMEN: Soft, nontender, nondistended. Bowel sounds present. No organomegaly or mass.  EXTREMITIES: No pedal edema, cyanosis, or clubbing.  NEUROLOGIC: Cranial nerves II through XII are intact. Muscle strength 5/5 in all extremities. Sensation intact. Gait not checked.  PSYCHIATRIC: The patient is alert and oriented x 3.  SKIN: No obvious rash, lesion, or ulcer.    LABORATORY PANEL:   CBC  Recent Labs Lab 12/05/16 0408  WBC 8.8  HGB 13.1  HCT 37.0*  PLT 175   ------------------------------------------------------------------------------------------------------------------  Chemistries   Recent Labs Lab 12/04/16 1539 12/05/16 0408  NA 132* 137  K 4.0 3.5  CL 97* 104  CO2 24 25  GLUCOSE 197* 103*  BUN 19 15  CREATININE 1.08 1.00  CALCIUM 9.3 8.7*  AST 63*  --   ALT 55  --   ALKPHOS 62  --   BILITOT 1.1  --    ------------------------------------------------------------------------------------------------------------------  Cardiac Enzymes No results for input(s): TROPONINI in the last 168 hours. ------------------------------------------------------------------------------------------------------------------  RADIOLOGY:  Ct Abdomen Pelvis W Contrast  Result Date: 12/04/2016 CLINICAL DATA:  Left lower quadrant pain with intermittent constipation and diarrhea for the past week. Today  having bright red blood in stool. EXAM: CT ABDOMEN  AND PELVIS WITH CONTRAST TECHNIQUE: Multidetector CT imaging of the abdomen and pelvis was performed using the standard protocol following bolus administration of intravenous contrast. CONTRAST:  12mL ISOVUE-300 IOPAMIDOL (ISOVUE-300) INJECTION 61% COMPARISON:  03/20/2015 CT FINDINGS: Lower chest: Normal size cardiac chambers. Minimal scarring at the left lung base and right middle lobe. No effusion or pulmonary consolidation. Hepatobiliary: Hepatic steatosis.  Normal gallbladder. Pancreas: Unremarkable Spleen: Unremarkable Adrenals/Urinary Tract: Adrenal glands are unremarkable. Kidneys are normal, without renal calculi, focal lesion, or hydronephrosis. Bladder is unremarkable. Stomach/Bowel: Diffuse transmural thickening of descending colon through rectum consistent with proctocolitis. No bowel obstruction, perforation or abscess. Duodenal diverticulum however is of incidental note off the second portion, otherwise the small bowel is unremarkable. Normal appendix. Vascular/Lymphatic: Aortic and branch vessel atherosclerosis without aneurysm or dissection. No enlarged lymph nodes by CT criteria. Reproductive: Scattered peripheral calcifications of along the corpora cavernosa in the region of the tunica which can be seen with Peyronie disease. Other: No abdominal wall hernia or abnormality. No abdominopelvic ascites. Fat noted in the left inguinal canal. Musculoskeletal: Degenerative disc disease L4-5 and L5-S1 with slight retrolisthesis grade 1 of L5 on S1. IMPRESSION: 1. Diffuse transmural thickening from descending colon through rectum consistent with proctocolitis. 2. Hepatic steatosis. 3. Lower lumbar degenerative disc disease L4-5 and L5-S1. 4. Calcifications along the tunica of penis which can be seen with Peyronie disease. Electronically Signed   By: Ashley Royalty M.D.   On: 12/04/2016 19:01    EKG:   Orders placed or performed during the hospital  encounter of 05/28/16  . ED EKG  . ED EKG  . EKG 12-Lead  . EKG 12-Lead  . EKG 12-Lead  . EKG 12-Lead  . EKG    ASSESSMENT AND PLAN:  1.rectal bleed and abdominal cramps due to proctocolitis;symptoms are better with cipro and flagyl.hemoglobin stable.colonoscopy 2 yrs ago showed diverticula and sessile polyp. Advance to soft  Diet.GI consult pending.hb stable  At 13.  2.htn;controlled 3.CAD;stable Chronic pain syndrome;on Narco Possible discharge am with abx if okay with GI.  All the records are reviewed and case discussed with Care Management/Social Workerr. Management plans discussed with the patient, family and they are in agreement.  CODE STATUS:full  TOTAL TIME TAKING CARE OF THIS PATIENT:35 minutes.   POSSIBLE D/C IN 1-2 DAYS, DEPENDING ON CLINICAL CONDITION.   Epifanio Lesches M.D on 12/05/2016 at 4:02 PM  Between 7am to 6pm - Pager - [phone]  After 6pm go to [email or web address] - password [email or web address]  [place] [facility]  Office  [phone]  CC: Primary care physician; Elisabeth Cara, NP   Note: This dictation was prepared with Dragon dictation along with smaller phrase technology. Any transcriptional errors that result from this process are unintentional.

## 2016-12-05 NOTE — Consult Note (Signed)
[name] Bellows MD  [address] [place], [place] [address] Phone: [phone] Fax : [phone]  Consultation  Referring Provider:    Dr Darvin Neighbours  Primary Care Physician:  Elisabeth Cara, NP Primary Gastroenterologist:           Reason for Consultation:     GI bleed   Date of Admission:  12/04/2016 Date of Consultation:  12/05/2016         HPI:   [name] [name]. is a 61 y.o. male admitted with diarrhea, LLQ pain and rectal bleeding . Diffuse thickening of the descending colon through rectum suggestive of proctocolitis.   last colonoscopy in 2016 showed no colitis by Dr Signe Colt .   Says all of a sudden yesterday had a few bloodbowel movements which stopped this morning , denies any fever, nsaid use, family history of colitis, prior similar episodes, had his dinner tonight including a cheese burger and feels well. No other sick contacts.    Past Medical History:  Diagnosis Date  . Benign esophageal stricture   . Cardiomyopathy, secondary ([place])   . Coronary artery disease   . Diverticulitis   . Gout   . Hemorrhoids   . Hyperlipidemia   . Hypertension   . Sleep apnea   . Tubular adenoma of colon     Past Surgical History:  Procedure Laterality Date  . cardiac bypass    . CARDIAC CATHETERIZATION Left 02/03/2016   Procedure: Left Heart Cath and Coronary Angiography;  Surgeon: Teodoro Spray, MD;  Location: [facility];  Service: Cardiovascular;  Laterality: Left;  . CARDIAC SURGERY    . COLONOSCOPY WITH PROPOFOL N/A 04/29/2015   Procedure: COLONOSCOPY WITH PROPOFOL;  Surgeon: Hulen Luster, MD;  Location: [facility] ENDOSCOPY;  Service: Gastroenterology;  Laterality: N/A;  . CORONARY ANGIOPLASTY WITH STENT PLACEMENT     x2  . CORONARY ARTERY BYPASS GRAFT    . ESOPHAGOGASTRODUODENOSCOPY (EGD) WITH PROPOFOL N/A 04/29/2015   Procedure: ESOPHAGOGASTRODUODENOSCOPY (EGD) WITH PROPOFOL;  Surgeon: Hulen Luster, MD;  Location: [facility] ENDOSCOPY;  Service: Gastroenterology;  Laterality: N/A;  .  SEPTOPLASTY      Prior to Admission medications   Medication Sig Start Date End Date Taking? Authorizing Provider  allopurinol (ZYLOPRIM) 300 MG tablet Take 300 mg by mouth daily. *patient takes in the afternoon*   Yes Historical Provider, MD  amLODipine (NORVASC) 10 MG tablet Take 10 mg by mouth daily.  07/23/16  Yes Historical Provider, MD  aspirin EC 81 MG tablet Take 81 mg by mouth every morning.   Yes Historical Provider, MD  atorvastatin (LIPITOR) 40 MG tablet Take 40 mg by mouth at bedtime.    Yes Historical Provider, MD  Cholecalciferol (VITAMIN D3) 2000 units TABS Take 2,000 Units by mouth every morning.   Yes Historical Provider, MD  gabapentin (NEURONTIN) 600 MG tablet Take 1 tablet (600 mg total) by mouth 2 (two) times daily. 11/17/16 02/15/17 Yes Milinda Pointer, MD  hydrochlorothiazide (HYDRODIURIL) 25 MG tablet Take 25 mg by mouth daily.  07/23/16  Yes Historical Provider, MD  HYDROcodone-acetaminophen (NORCO/VICODIN) 5-325 MG tablet Take 1 tablet by mouth 2 (two) times daily as needed for severe pain. 11/17/16 12/17/16 Yes Milinda Pointer, MD  isosorbide mononitrate (IMDUR) 30 MG 24 hr tablet Take 30 mg by mouth every morning.    Yes Historical Provider, MD  losartan (COZAAR) 100 MG tablet take 1 tablet by mouth once daily 08/02/16  Yes Historical Provider, MD  metoprolol (LOPRESSOR) 50 MG tablet  Take 50 mg by mouth 3 (three) times daily.   Yes Historical Provider, MD  Multiple Vitamin (MULTIVITAMIN WITH MINERALS) TABS tablet Take 1 tablet by mouth every evening.   Yes Historical Provider, MD  nitroGLYCERIN (NITROSTAT) 0.4 MG SL tablet Place 0.4 mg under the tongue every 5 (five) minutes x 3 doses as needed for chest pain.  03/07/16  Yes Historical Provider, MD  Potassium 95 MG TABS Take 95 mg by mouth every morning.    Yes Historical Provider, MD  bisacodyl (DULCOLAX) 5 MG EC tablet Take 2 tablets (10 mg total) by mouth at bedtime as needed for moderate constipation ((Hold for loose  stool)). Patient not taking: Reported on 12/04/2016 11/17/16 02/15/17  Milinda Pointer, MD  Blood Glucose Monitoring Suppl (FIFTY50 GLUCOSE METER 2.0) w/Device KIT Use as directed. Product selection permitted according to insurance preference. E11.9 Type 2 diabetes mellitus 04/09/16 04/09/17  Historical Provider, MD  docusate sodium (COLACE) 100 MG capsule Take 2 capsules (200 mg total) by mouth at bedtime as needed for moderate constipation. Do not use longer than 7 days. Patient not taking: Reported on 12/04/2016 11/17/16 02/15/17  Milinda Pointer, MD  HYDROcodone-acetaminophen (NORCO/VICODIN) 5-325 MG tablet Take 1 tablet by mouth 2 (two) times daily as needed for severe pain. 01/16/17 02/15/17  Milinda Pointer, MD  HYDROcodone-acetaminophen (NORCO/VICODIN) 5-325 MG tablet Take 1 tablet by mouth 2 (two) times daily as needed for severe pain. 12/17/16 01/16/17  Milinda Pointer, MD  Lancet Devices ([employer] LANCING DEVICE) MISC Use 1 each 3 (three) times daily. Product selection permitted according to insurance preference. E11.9 Type 2 diabetes mellitus 04/09/16 04/09/17  Historical Provider, MD    Family History  Problem Relation Age of Onset  . Heart disease Mother   . Hypertension Father      Social History  Substance Use Topics  . Smoking status: Former Smoker    Years: 25.00  . Smokeless tobacco: Former Systems developer  . Alcohol use Yes     Comment: occasional    Allergies as of 12/04/2016  . (No Known Allergies)    Review of Systems:    All systems reviewed and negative except where noted in HPI.   Physical Exam:  Vital signs in last 24 hours: Temp:  [97.8 F (36.6 C)-98.5 F (36.9 C)] 98 F (36.7 C) (04/04 0439) Pulse Rate:  [73-87] 77 (04/04 0905) Resp:  [17-22] 22 (04/04 0439) BP: (101-131)/(55-78) 113/66 (04/04 0905) SpO2:  [96 %-97 %] 97 % (04/04 0439) Weight:  [170 lb (77.1 kg)] 170 lb (77.1 kg) (04/03 2213) Last BM Date: 12/04/16 General:   Pleasant, cooperative in NAD Head:   Normocephalic and atraumatic. Eyes:   No icterus.   Conjunctiva pink. PERRLA. Ears:  Normal auditory acuity. Neck:  Supple; no masses or thyroidomegaly Lungs: Respirations even and unlabored. Lungs clear to auscultation bilaterally.   No wheezes, crackles, or rhonchi.  Heart:  Regular rate and rhythm;  Without murmur, clicks, rubs or gallops Abdomen:  Soft, nondistended, nontender. Normal bowel sounds. No appreciable masses or hepatomegaly.  No rebound or guarding.  Rectal:  Not performed. Extremities:  Without edema, cyanosis or clubbing. Neurologic:  Alert and oriented x3;  grossly normal neurologically. Psych:  Alert and cooperative. Normal affect.  LAB RESULTS:  Recent Labs  12/04/16 1539 12/04/16 2132 12/05/16 0408  WBC 11.8*  --  8.8  HGB 15.2 14.0 13.1  HCT 44.3  --  37.0*  PLT 222  --  175   BMET  Recent  Labs  12/04/16 1539 12/05/16 0408  NA 132* 137  K 4.0 3.5  CL 97* 104  CO2 24 25  GLUCOSE 197* 103*  BUN 19 15  CREATININE 1.08 1.00  CALCIUM 9.3 8.7*   LFT  Recent Labs  12/04/16 1539  PROT 8.4*  ALBUMIN 4.7  AST 63*  ALT 55  ALKPHOS 62  BILITOT 1.1   PT/INR No results for input(s): LABPROT, INR in the last 72 hours.  STUDIES: Ct Abdomen Pelvis W Contrast  Result Date: 12/04/2016 CLINICAL DATA:  Left lower quadrant pain with intermittent constipation and diarrhea for the past week. Today having bright red blood in stool. EXAM: CT ABDOMEN AND PELVIS WITH CONTRAST TECHNIQUE: Multidetector CT imaging of the abdomen and pelvis was performed using the standard protocol following bolus administration of intravenous contrast. CONTRAST:  128m ISOVUE-300 IOPAMIDOL (ISOVUE-300) INJECTION 61% COMPARISON:  03/20/2015 CT FINDINGS: Lower chest: Normal size cardiac chambers. Minimal scarring at the left lung base and right middle lobe. No effusion or pulmonary consolidation. Hepatobiliary: Hepatic steatosis.  Normal gallbladder. Pancreas: Unremarkable Spleen:  Unremarkable Adrenals/Urinary Tract: Adrenal glands are unremarkable. Kidneys are normal, without renal calculi, focal lesion, or hydronephrosis. Bladder is unremarkable. Stomach/Bowel: Diffuse transmural thickening of descending colon through rectum consistent with proctocolitis. No bowel obstruction, perforation or abscess. Duodenal diverticulum however is of incidental note off the second portion, otherwise the small bowel is unremarkable. Normal appendix. Vascular/Lymphatic: Aortic and branch vessel atherosclerosis without aneurysm or dissection. No enlarged lymph nodes by CT criteria. Reproductive: Scattered peripheral calcifications of along the corpora cavernosa in the region of the tunica which can be seen with Peyronie disease. Other: No abdominal wall hernia or abnormality. No abdominopelvic ascites. Fat noted in the left inguinal canal. Musculoskeletal: Degenerative disc disease L4-5 and L5-S1 with slight retrolisthesis grade 1 of L5 on S1. IMPRESSION: 1. Diffuse transmural thickening from descending colon through rectum consistent with proctocolitis. 2. Hepatic steatosis. 3. Lower lumbar degenerative disc disease L4-5 and L5-S1. 4. Calcifications along the tunica of penis which can be seen with Peyronie disease. Electronically Signed   By: DAshley RoyaltyM.D.   On: 12/04/2016 19:01      Impression / Plan:   JMahesh Sizemore is a 61y.o. y/o male with acute onset of bloody diarrhea which is resolving rapidly. CT scan shows left sided colitis. Very likely a self limiting infectious colitis with pre formed toxins that is getting better soon. Expect complete resolution in 1-2 days. If recurs will need a sigmoidoscopy to r/o ulcerative colitis.    Thank you for involving me in the care of this patient.      LOS: 1 day   KJonathon Bellows MD  12/05/2016, 11:54 AM

## 2016-12-06 LAB — HEMOGLOBIN: HEMOGLOBIN: 14.3 g/dL (ref 13.0–18.0)

## 2016-12-06 MED ORDER — CIPROFLOXACIN HCL 500 MG PO TABS: 500.0000 mg | ORAL_TABLET | Freq: Two times a day (BID) | ORAL | 0 refills | Status: DC

## 2016-12-06 MED ORDER — METRONIDAZOLE 500 MG PO TABS: 500.0000 mg | ORAL_TABLET | Freq: Three times a day (TID) | ORAL | 0 refills | Status: DC

## 2016-12-06 NOTE — Discharge Instructions (Signed)
Resume diet and activity as before ° ° °

## 2016-12-06 NOTE — Progress Notes (Signed)
MD order received to discharge pt home today; verbally reviewed AVS with pt, no questions voiced at this time; pt discharged via wheelchair by security to the visitor's entrance in order to take him to his personal car parked in the ED parking lot

## 2016-12-06 NOTE — Progress Notes (Signed)
[name] Bellows MD [address] [place], [place] [address] Phone: [phone] Fax : [phone]  [name] [name]. is being followed for acute colitis  Day 2 of follow up   Subjective: No further bleeding , had a non bloody bowel movement since yesterday evening. No abdominal pain.    Objective: Vital signs in last 24 hours: Vitals:   12/05/16 1309 12/05/16 1504 12/05/16 2001 12/06/16 0436  BP: 111/62 (!) 96/56 (!) 105/57 113/64  Pulse: 72 68 73 72  Resp:  18  20  Temp:  98.3 F (36.8 C) 98.2 F (36.8 C) 98.5 F (36.9 C)  TempSrc:   Oral   SpO2:  95% 92% 96%  Weight:      Height:       Weight change:   Intake/Output Summary (Last 24 hours) at 12/06/16 0644 Last data filed at 12/05/16 1800  Gross per 24 hour  Intake             1480 ml  Output                0 ml  Net             1480 ml     Exam: Heart:: Regular rate and rhythm, S1S2 present or without murmur or extra heart sounds Lungs: normal, clear to auscultation and clear to auscultation and percussion Abdomen: soft, nontender, normal bowel sounds   Lab Results: CBC Latest Ref Rng & Units 12/06/2016 12/05/2016 12/04/2016  WBC 3.8 - 10.6 K/uL - 8.8 -  Hemoglobin 13.0 - 18.0 g/dL 14.3 13.1 14.0  Hematocrit 40.0 - 52.0 % - 37.0(L) -  Platelets 150 - 440 K/uL - 175 -    Micro Results: Recent Results (from the past 240 hour(s))  Gastrointestinal Panel by PCR , Stool     Status: None   Collection Time: 12/04/16  6:15 PM  Result Value Ref Range Status   Campylobacter species NOT DETECTED NOT DETECTED Final   Plesimonas shigelloides NOT DETECTED NOT DETECTED Final   Salmonella species NOT DETECTED NOT DETECTED Final   Yersinia enterocolitica NOT DETECTED NOT DETECTED Final   Vibrio species NOT DETECTED NOT DETECTED Final   Vibrio cholerae NOT DETECTED NOT DETECTED Final   Enteroaggregative E coli (EAEC) NOT DETECTED NOT DETECTED Final   Enteropathogenic E coli (EPEC) NOT DETECTED NOT DETECTED Final   Enterotoxigenic E coli (ETEC) NOT DETECTED NOT DETECTED Final   Shiga like toxin producing E coli (STEC) NOT DETECTED NOT DETECTED Final   Shigella/Enteroinvasive E coli (EIEC) NOT DETECTED NOT DETECTED Final   Cryptosporidium NOT DETECTED NOT DETECTED Final   Cyclospora cayetanensis NOT DETECTED NOT DETECTED Final   Entamoeba histolytica NOT DETECTED NOT DETECTED Final   Giardia lamblia NOT DETECTED NOT DETECTED Final   Adenovirus F40/41 NOT DETECTED NOT DETECTED Final   Astrovirus NOT DETECTED NOT DETECTED Final   Norovirus GI/GII NOT DETECTED NOT DETECTED Final   Rotavirus A NOT DETECTED NOT DETECTED Final   Sapovirus (I, II, IV, and V) NOT DETECTED NOT DETECTED Final  C difficile quick scan w PCR reflex     Status: None   Collection Time: 12/04/16  6:15 PM  Result Value Ref Range Status   C Diff antigen NEGATIVE NEGATIVE Final   C Diff toxin NEGATIVE NEGATIVE Final   C Diff interpretation No C. difficile detected.  Final   Studies/Results: Ct Abdomen Pelvis W Contrast  Result Date: 12/04/2016 CLINICAL DATA:  Left lower quadrant  pain with intermittent constipation and diarrhea for the past week. Today having bright red blood in stool. EXAM: CT ABDOMEN AND PELVIS WITH CONTRAST TECHNIQUE: Multidetector CT imaging of the abdomen and pelvis was performed using the standard protocol following bolus administration of intravenous contrast. CONTRAST:  15mL ISOVUE-300 IOPAMIDOL (ISOVUE-300) INJECTION 61% COMPARISON:  03/20/2015 CT FINDINGS: Lower chest: Normal size cardiac chambers. Minimal scarring at the left lung base and right middle lobe. No effusion or pulmonary consolidation. Hepatobiliary: Hepatic steatosis.  Normal gallbladder. Pancreas: Unremarkable Spleen: Unremarkable Adrenals/Urinary Tract: Adrenal glands are unremarkable. Kidneys are normal, without renal calculi, focal lesion, or hydronephrosis. Bladder is unremarkable. Stomach/Bowel: Diffuse transmural thickening of descending colon  through rectum consistent with proctocolitis. No bowel obstruction, perforation or abscess. Duodenal diverticulum however is of incidental note off the second portion, otherwise the small bowel is unremarkable. Normal appendix. Vascular/Lymphatic: Aortic and branch vessel atherosclerosis without aneurysm or dissection. No enlarged lymph nodes by CT criteria. Reproductive: Scattered peripheral calcifications of along the corpora cavernosa in the region of the tunica which can be seen with Peyronie disease. Other: No abdominal wall hernia or abnormality. No abdominopelvic ascites. Fat noted in the left inguinal canal. Musculoskeletal: Degenerative disc disease L4-5 and L5-S1 with slight retrolisthesis grade 1 of L5 on S1. IMPRESSION: 1. Diffuse transmural thickening from descending colon through rectum consistent with proctocolitis. 2. Hepatic steatosis. 3. Lower lumbar degenerative disc disease L4-5 and L5-S1. 4. Calcifications along the tunica of penis which can be seen with Peyronie disease. Electronically Signed   By: Ashley Royalty M.D.   On: 12/04/2016 19:01   Medications: I have reviewed the patient's current medications. Scheduled Meds: . allopurinol  300 mg Oral Daily  . atorvastatin  40 mg Oral q1800  . ciprofloxacin  400 mg Intravenous Q12H  . gabapentin  600 mg Oral BID  . hydrochlorothiazide  25 mg Oral Daily  . isosorbide mononitrate  30 mg Oral [id number]  . metoprolol  50 mg Oral TID  . metroNIDAZOLE  500 mg Oral Q8H  . sodium chloride flush  3 mL Intravenous Q12H  . sodium chloride flush  3 mL Intravenous Q12H   Continuous Infusions: PRN Meds:.sodium chloride, acetaminophen **OR** acetaminophen, albuterol, HYDROcodone-acetaminophen, ondansetron **OR** ondansetron (ZOFRAN) IV, polyethylene glycol   Assessment: Active Problems:   Colitis  [name] [name]. is a 61 y.o. y/o male with acute onset of bloody diarrhea which is resolving rapidly. CT scan shows left sided colitis. Very likely  a self limiting infectious colitis with pre formed toxins that is getting better soon. Clinically seems all his symptoms have resolved. I have informed him that when he goes home if his symptoms recur, to come and see me in my office for further evaluation .   I will sign off.  Please call me if any further GI concerns or questions.  We would like to thank you for the opportunity to participate in the care of Joanna Hall..    LOS: 2 days   [name] [name] 12/06/2016, 6:44 AM

## 2016-12-07 LAB — BPAM RBC
Blood Product Expiration Date: 201804142359
Blood Product Expiration Date: 201804152359
UNIT TYPE AND RH: 5100
Unit Type and Rh: 5100

## 2016-12-07 LAB — TYPE AND SCREEN
ABO/RH(D): O POS
Antibody Screen: POSITIVE
PT AG Type: POSITIVE
UNIT DIVISION: 0
Unit division: 0

## 2016-12-10 NOTE — Discharge Summary (Signed)
[facility] NAME: [name] [name]    MR#:  [id number]  DATE OF BIRTH:  May 10, 1956  DATE OF ADMISSION:  12/04/2016 ADMITTING PHYSICIAN: Hillary Bow, MD  DATE OF DISCHARGE: 12/06/2016 10:40 AM  PRIMARY CARE PHYSICIAN: Elisabeth Cara, NP   ADMISSION DIAGNOSIS:  Proctocolitis [K52.9] Gastrointestinal hemorrhage, unspecified gastrointestinal hemorrhage type [K92.2]  DISCHARGE DIAGNOSIS:  Active Problems:   Colitis   SECONDARY DIAGNOSIS:   Past Medical History:  Diagnosis Date  . Benign esophageal stricture   . Cardiomyopathy, secondary ([place])   . Coronary artery disease   . Diverticulitis   . Gout   . Hemorrhoids   . Hyperlipidemia   . Hypertension   . Sleep apnea   . Tubular adenoma of colon      ADMITTING HISTORY  HISTORY OF PRESENT ILLNESS:  [name] [name]  is a 61 y.o. male with a known history of CAD, CKD, chronic pain syndrome, diverticulosis presents to the emergency room complaining of some mild diarrhea, left lower quadrant abdominal pain, rectal bleeding. Patient does not feel lightheaded or dizzy. He had 2 episodes of blood in his stool in the emergency room with significant amount. Hemoglobin is at 15. CT scan of the abdomen and pelvis shows proctocolitis with no abscess. Afebrile. Patient is being admitted for proctocolitis and GI bleed. Takes a baby aspirin.   HOSPITAL COURSE:   * Procotcolitis causing rectal bleeding Patient admitted to medical bed. Started on IV abx. Seen by GI and did not advise any endoscopy procedures. Hb remained stable. By the day of discharge patient has no abd pain and had a BM without blood. Tolerating soft diet. Discharge to home and can restart aspirin in 2 days if no further bleeding  F/U with GI Dr. Vicente Males in 2 weeks  CONSULTS OBTAINED:    DRUG ALLERGIES:  No Known Allergies  DISCHARGE MEDICATIONS:   Discharge Medication List as of 12/06/2016 10:26 AM    START taking these  medications   Details  ciprofloxacin (CIPRO) 500 MG tablet Take 1 tablet (500 mg total) by mouth 2 (two) times daily., Starting Thu 12/06/2016, Normal    metroNIDAZOLE (FLAGYL) 500 MG tablet Take 1 tablet (500 mg total) by mouth every 8 (eight) hours., Starting Thu 12/06/2016, Normal      CONTINUE these medications which have NOT CHANGED   Details  allopurinol (ZYLOPRIM) 300 MG tablet Take 300 mg by mouth daily. *patient takes in the afternoon*, Historical Med    amLODipine (NORVASC) 10 MG tablet Take 10 mg by mouth daily. , Starting Mon 07/23/2016, Historical Med    aspirin EC 81 MG tablet Take 81 mg by mouth every morning., Historical Med    atorvastatin (LIPITOR) 40 MG tablet Take 40 mg by mouth at bedtime. , Until Discontinued, Historical Med    Cholecalciferol (VITAMIN D3) 2000 units TABS Take 2,000 Units by mouth every morning., Historical Med    gabapentin (NEURONTIN) 600 MG tablet Take 1 tablet (600 mg total) by mouth 2 (two) times daily., Starting Sat 11/17/2016, Until Fri 02/15/2017, Normal    hydrochlorothiazide (HYDRODIURIL) 25 MG tablet Take 25 mg by mouth daily. , Starting Mon 07/23/2016, Historical Med    !! HYDROcodone-acetaminophen (NORCO/VICODIN) 5-325 MG tablet Take 1 tablet by mouth 2 (two) times daily as needed for severe pain., Starting Sat 11/17/2016, Until Mon 12/17/2016, Print    isosorbide mononitrate (IMDUR) 30 MG 24 hr tablet Take 30 mg by mouth every morning. , Historical  Med    losartan (COZAAR) 100 MG tablet take 1 tablet by mouth once daily, Historical Med    metoprolol (LOPRESSOR) 50 MG tablet Take 50 mg by mouth 3 (three) times daily., Until Discontinued, Historical Med    Multiple Vitamin (MULTIVITAMIN WITH MINERALS) TABS tablet Take 1 tablet by mouth every evening., Until Discontinued, Historical Med    nitroGLYCERIN (NITROSTAT) 0.4 MG SL tablet Place 0.4 mg under the tongue every 5 (five) minutes x 3 doses as needed for chest pain. , Starting Wed  03/07/2016, Historical Med    Potassium 95 MG TABS Take 95 mg by mouth every morning. , Historical Med    bisacodyl (DULCOLAX) 5 MG EC tablet Take 2 tablets (10 mg total) by mouth at bedtime as needed for moderate constipation ((Hold for loose stool))., Starting Sat 11/17/2016, Until Fri 02/15/2017, Normal    Blood Glucose Monitoring Suppl (FIFTY50 GLUCOSE METER 2.0) w/Device KIT Use as directed. Product selection permitted according to insurance preference. E11.9 Type 2 diabetes mellitus, Historical Med    docusate sodium (COLACE) 100 MG capsule Take 2 capsules (200 mg total) by mouth at bedtime as needed for moderate constipation. Do not use longer than 7 days., Starting Sat 11/17/2016, Until Fri 02/15/2017, Normal    !! HYDROcodone-acetaminophen (NORCO/VICODIN) 5-325 MG tablet Take 1 tablet by mouth 2 (two) times daily as needed for severe pain., Starting Wed 01/16/2017, Until Fri 02/15/2017, Print    !! HYDROcodone-acetaminophen (NORCO/VICODIN) 5-325 MG tablet Take 1 tablet by mouth 2 (two) times daily as needed for severe pain., Starting Mon 12/17/2016, Until Wed 01/16/2017, Print    Lancet Devices ([employer] LANCING DEVICE) MISC Use 1 each 3 (three) times daily. Product selection permitted according to insurance preference. E11.9 Type 2 diabetes mellitus, Historical Med     !! - Potential duplicate medications found. Please discuss with provider.      Today   VITAL SIGNS:  Blood pressure (!) 118/57, pulse 75, temperature 98.5 F (36.9 C), resp. rate 20, height '5\' 3"'  (1.6 m), weight 77.1 kg (170 lb), SpO2 96 %.  I/O:  No intake or output data in the 24 hours ending 12/10/16 0823  PHYSICAL EXAMINATION:  Physical Exam  GENERAL:  61 y.o.-year-old patient lying in the bed with no acute distress.  LUNGS: Normal breath sounds bilaterally, no wheezing, rales,rhonchi or crepitation. No use of accessory muscles of respiration.  CARDIOVASCULAR: S1, S2 normal. No murmurs, rubs, or gallops.  ABDOMEN:  Soft, non-tender, non-distended. Bowel sounds present. No organomegaly or mass.  NEUROLOGIC: Moves all 4 extremities. PSYCHIATRIC: The patient is alert and oriented x 3.  SKIN: No obvious rash, lesion, or ulcer.   DATA REVIEW:   CBC  Recent Labs Lab 12/05/16 0408 12/06/16 0422  WBC 8.8  --   HGB 13.1 14.3  HCT 37.0*  --   PLT 175  --     Chemistries   Recent Labs Lab 12/04/16 1539 12/05/16 0408  NA 132* 137  K 4.0 3.5  CL 97* 104  CO2 24 25  GLUCOSE 197* 103*  BUN 19 15  CREATININE 1.08 1.00  CALCIUM 9.3 8.7*  AST 63*  --   ALT 55  --   ALKPHOS 62  --   BILITOT 1.1  --     Cardiac Enzymes No results for input(s): TROPONINI in the last 168 hours.  Microbiology Results  Results for orders placed or performed during the hospital encounter of 12/04/16  Gastrointestinal Panel by PCR , Stool  Status: None   Collection Time: 12/04/16  6:15 PM  Result Value Ref Range Status   Campylobacter species NOT DETECTED NOT DETECTED Final   Plesimonas shigelloides NOT DETECTED NOT DETECTED Final   Salmonella species NOT DETECTED NOT DETECTED Final   Yersinia enterocolitica NOT DETECTED NOT DETECTED Final   Vibrio species NOT DETECTED NOT DETECTED Final   Vibrio cholerae NOT DETECTED NOT DETECTED Final   Enteroaggregative E coli (EAEC) NOT DETECTED NOT DETECTED Final   Enteropathogenic E coli (EPEC) NOT DETECTED NOT DETECTED Final   Enterotoxigenic E coli (ETEC) NOT DETECTED NOT DETECTED Final   Shiga like toxin producing E coli (STEC) NOT DETECTED NOT DETECTED Final   Shigella/Enteroinvasive E coli (EIEC) NOT DETECTED NOT DETECTED Final   Cryptosporidium NOT DETECTED NOT DETECTED Final   Cyclospora cayetanensis NOT DETECTED NOT DETECTED Final   Entamoeba histolytica NOT DETECTED NOT DETECTED Final   Giardia lamblia NOT DETECTED NOT DETECTED Final   Adenovirus F40/41 NOT DETECTED NOT DETECTED Final   Astrovirus NOT DETECTED NOT DETECTED Final   Norovirus GI/GII NOT  DETECTED NOT DETECTED Final   Rotavirus A NOT DETECTED NOT DETECTED Final   Sapovirus (I, II, IV, and V) NOT DETECTED NOT DETECTED Final  C difficile quick scan w PCR reflex     Status: None   Collection Time: 12/04/16  6:15 PM  Result Value Ref Range Status   C Diff antigen NEGATIVE NEGATIVE Final   C Diff toxin NEGATIVE NEGATIVE Final   C Diff interpretation No C. difficile detected.  Final    RADIOLOGY:  No results found.  Follow up with PCP in 1 week.  Management plans discussed with the patient, family and they are in agreement.  CODE STATUS:  Code Status History    Date Active Date Inactive Code Status Order ID Comments User Context   12/04/2016  8:08 PM 12/06/2016  1:52 PM Full Code [id number]  Hillary Bow, MD ED   05/28/2016  9:22 AM 05/29/2016  4:02 PM Full Code [id number]  Gladstone Lighter, MD Inpatient   02/29/2016  4:06 AM 02/29/2016  8:08 PM Full Code [id number]  Saundra Shelling, MD Inpatient   02/24/2016  5:54 PM 02/25/2016  6:04 PM Full Code [id number]  Demetrios Loll, MD Inpatient    Advance Directive Documentation     Most Recent Value  Type of Advance Directive  Healthcare Power of Attorney  Pre-existing out of facility DNR order (yellow form or pink MOST form)  -  "MOST" Form in Place?  -      TOTAL TIME TAKING CARE OF THIS PATIENT ON DAY OF DISCHARGE: more than 30 minutes.   Hillary Bow R M.D on 12/10/2016 at 8:23 AM  Between 7am to 6pm - Pager - [phone]  After 6pm go to [email or web address] - password [email or web address] [facility]  Office  [phone]  CC: Primary care physician; Elisabeth Cara, NP  Note: This dictation was prepared with Dragon dictation along with smaller phrase technology. Any transcriptional errors that result from this process are unintentional.

## 2016-12-15 IMAGING — CR DG CHEST 2V
2 series · 2 of 2 positions shown · non-contrast
Comparison: PA and lateral chest 01/28/2016 and 03/19/2015.

CLINICAL DATA: Midsternal chest pain beginning today. History of
CABG.

EXAM:
CHEST  2 VIEW

[chest pa]
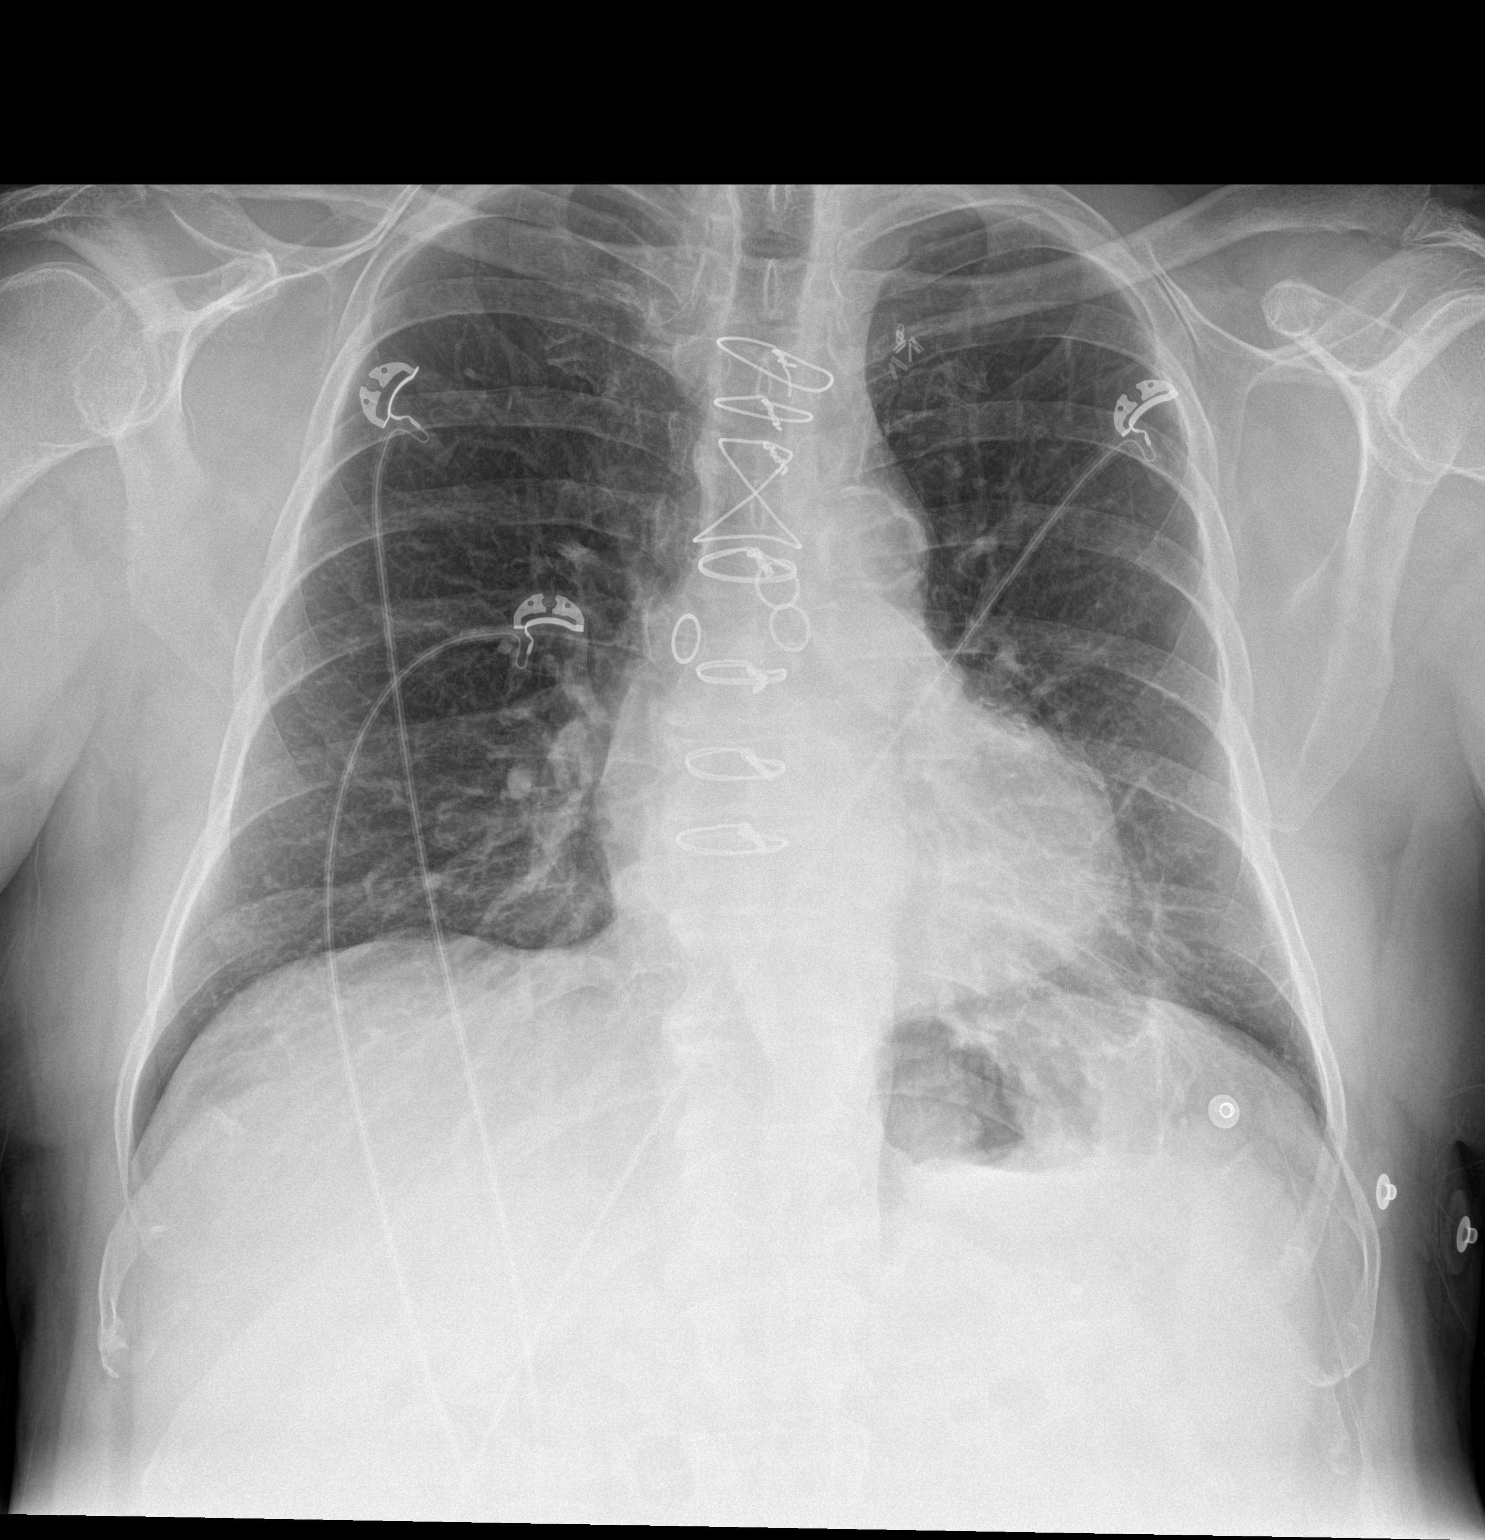

[chest lat]
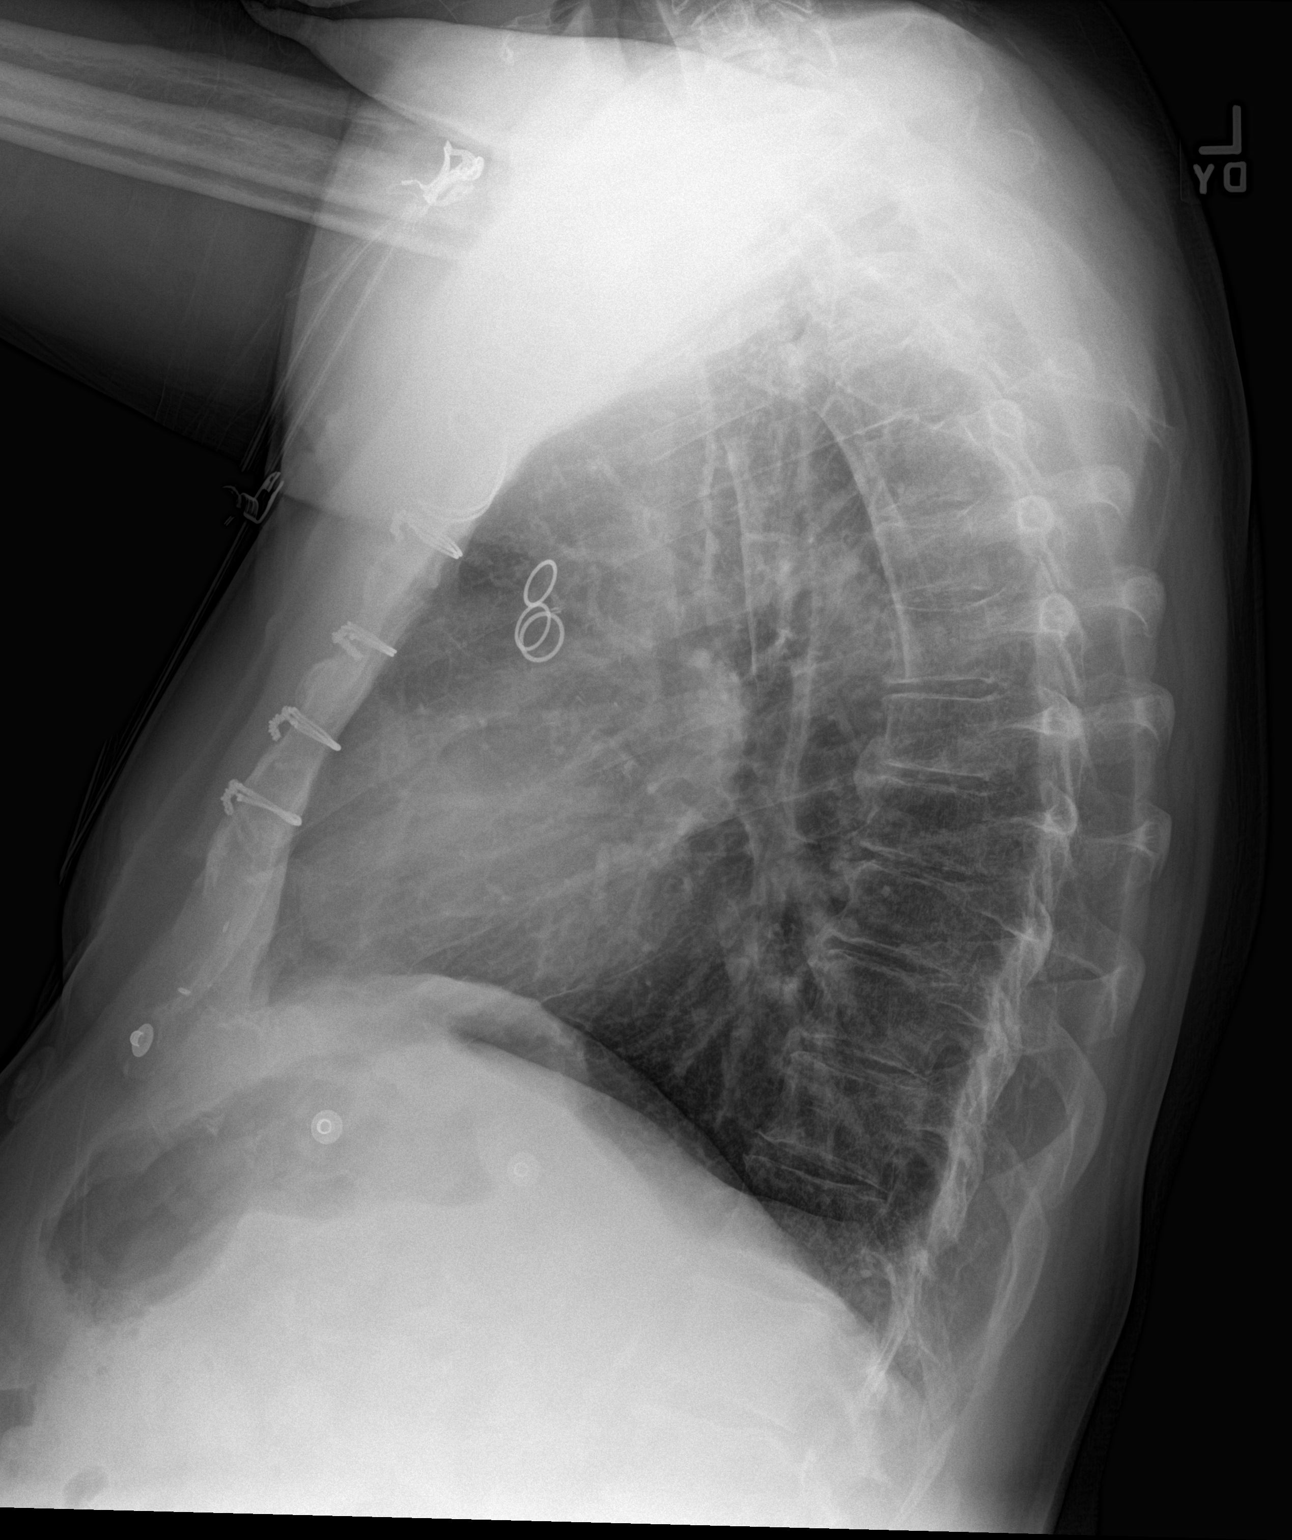

[2 of 2 positions shown; findings below may reference images not displayed]

FINDINGS: The patient is status post CABG. There is some scarring in the left
lung base. Lungs otherwise clear. No pneumothorax or pleural
effusion. Heart size is normal.
IMPRESSION: No acute disease.

## 2016-12-19 IMAGING — CR DG CHEST 2V
2 series · 2 of 2 positions shown · non-contrast
Comparison: 02/24/2016

CLINICAL DATA: Substernal chest pain

EXAM:
CHEST  2 VIEW

[chest pa]
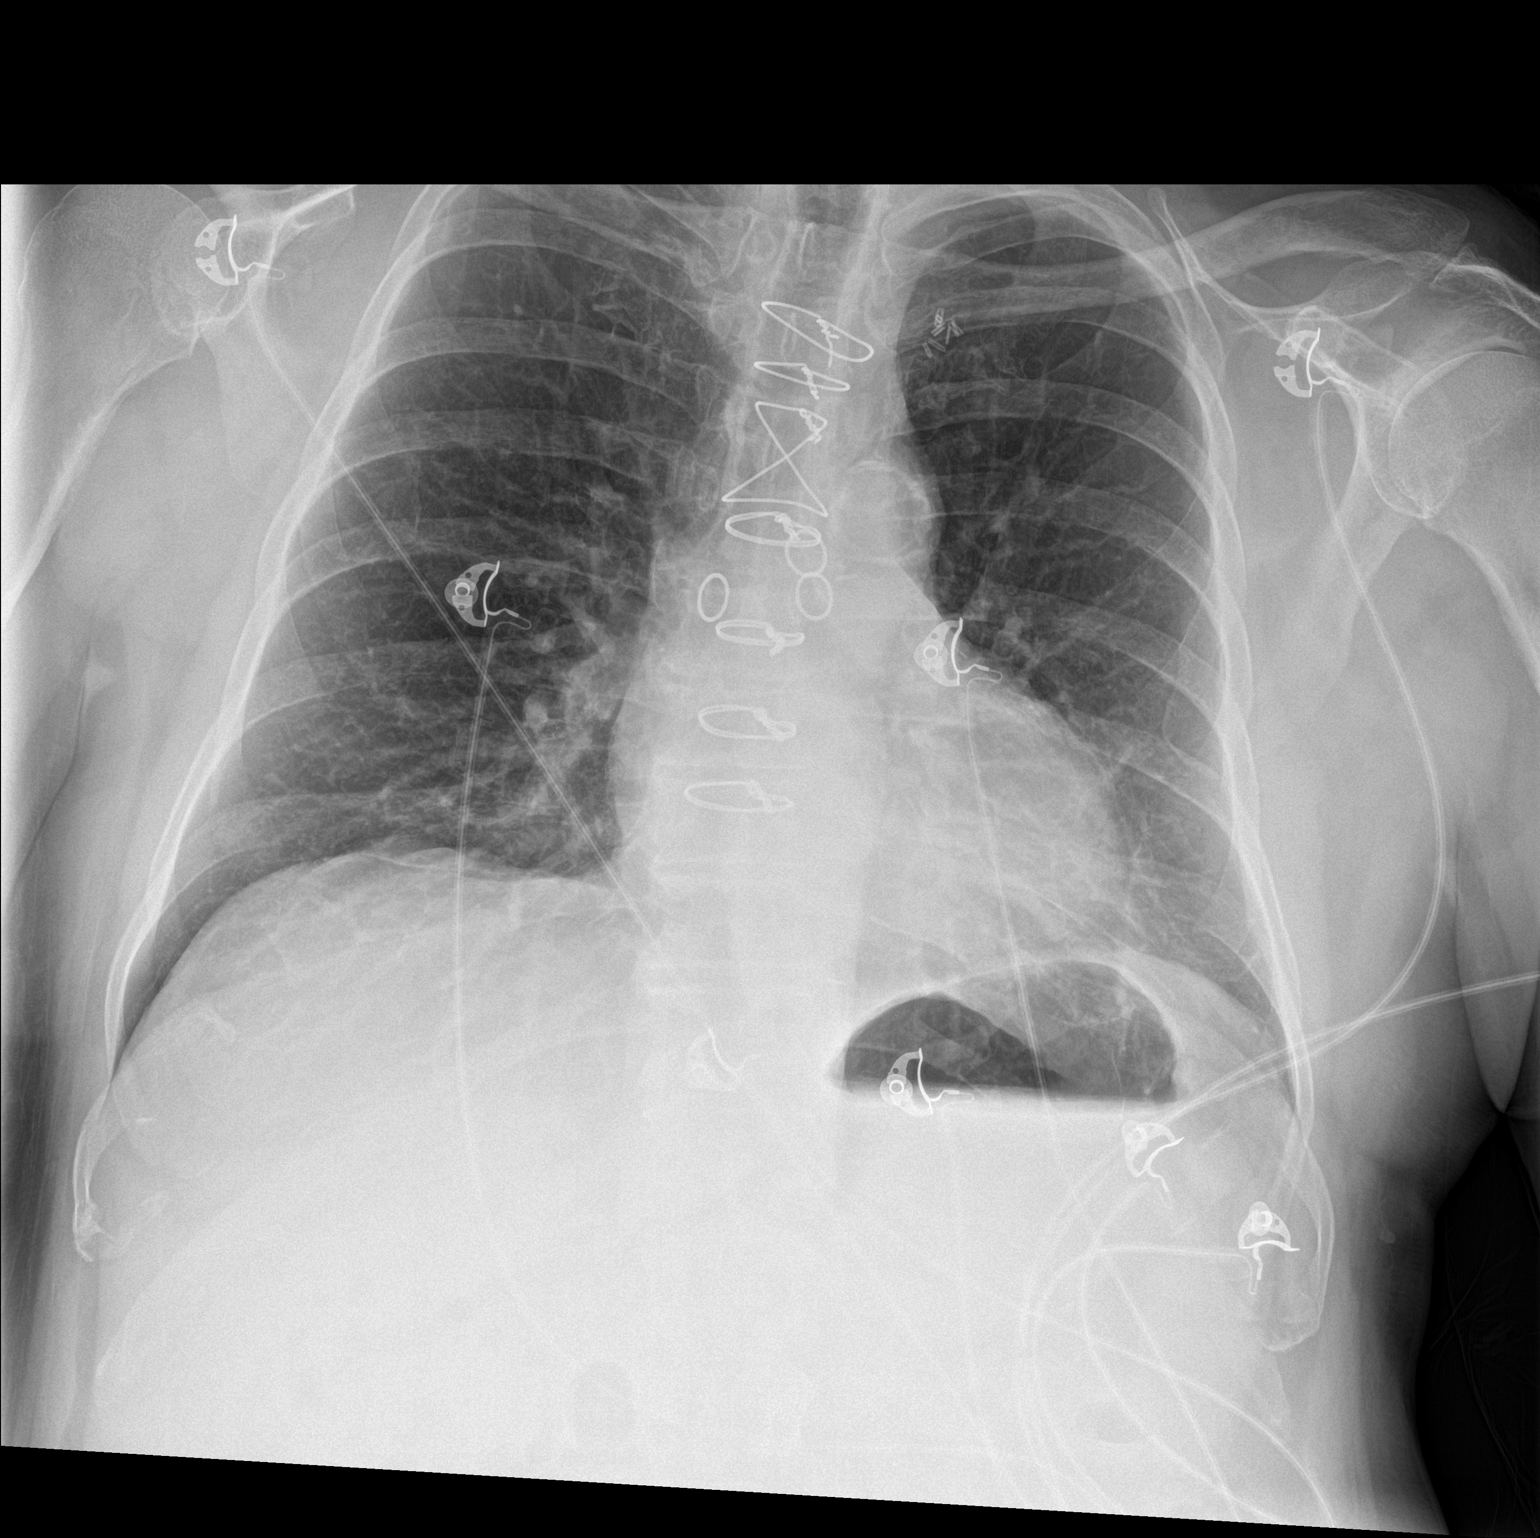

[chest lat]
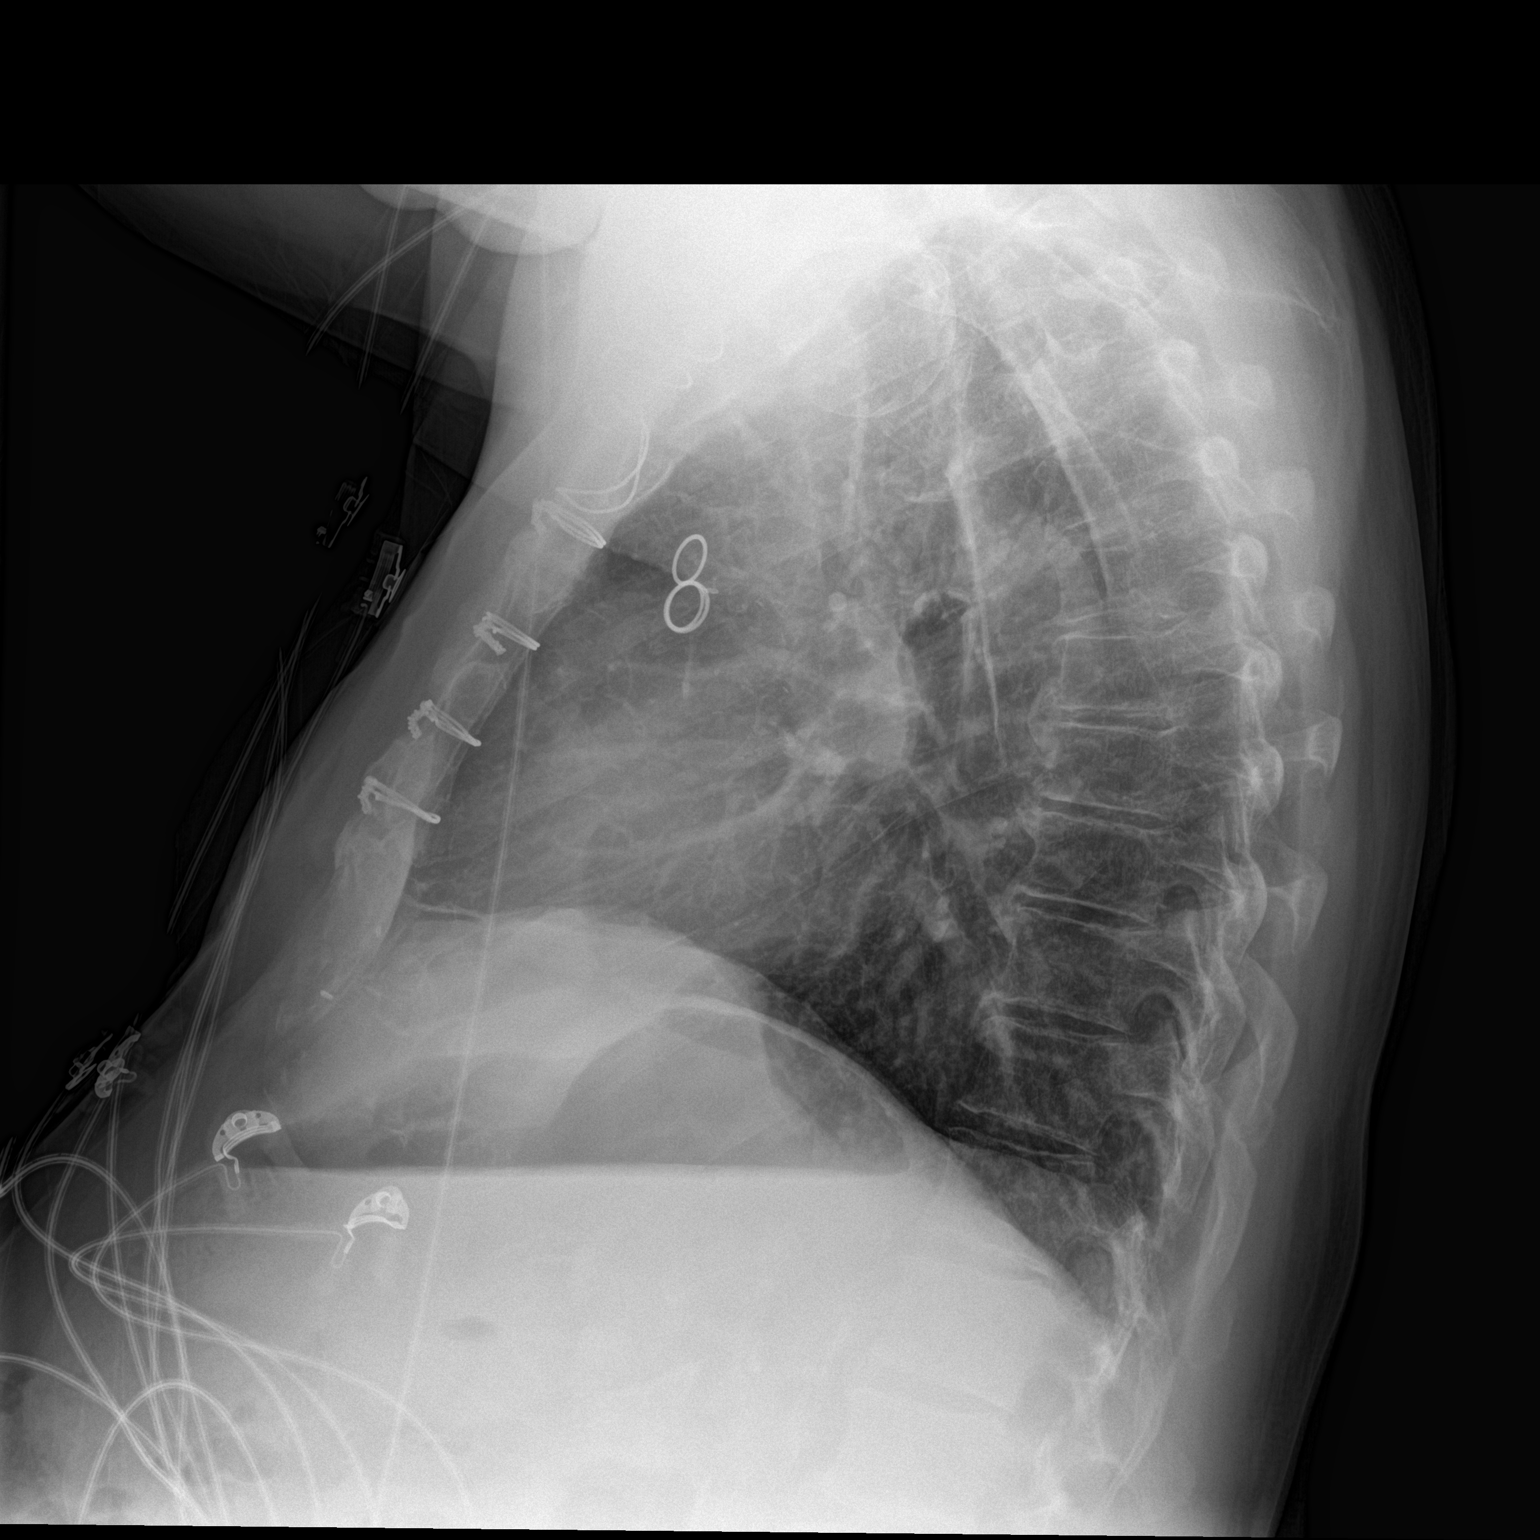

[2 of 2 positions shown; findings below may reference images not displayed]

FINDINGS: Cardiac shadow is within normal limits. Postsurgical changes are
noted. Aortic calcifications are again seen and stable. The lungs
are well aerated bilaterally. Degenerative changes of the thoracic
spine are noted.
IMPRESSION: Aortic atherosclerosis.

No acute abnormality noted.

## 2017-01-03 ENCOUNTER — Ambulatory Visit: Payer: Medicare Other | Admitting: Gastroenterology

## 2017-01-08 ENCOUNTER — Encounter: Payer: Self-pay | Admitting: Gastroenterology

## 2017-01-08 ENCOUNTER — Ambulatory Visit (INDEPENDENT_AMBULATORY_CARE_PROVIDER_SITE_OTHER): Payer: Medicare Other | Admitting: Gastroenterology

## 2017-01-08 VITALS — BP 109/70 | HR 76 | Temp 98.2°F | Ht 65.0 in | Wt 168.8 lb

## 2017-01-08 DIAGNOSIS — A09 Infectious gastroenteritis and colitis, unspecified: Secondary | ICD-10-CM | POA: Diagnosis not present

## 2017-01-08 NOTE — Progress Notes (Signed)
Primary Care Physician: Elisabeth Cara, NP  Primary Gastroenterologist:  Dr. Jonathon Bellows   No chief complaint on file.   HPI: [name] [name]. is a 61 y.o. male is here today for a hospital follow up . I was consulted to see him on 12/05/2016 when he presented with duarrhea, LLQ pain and rectal bleeding. 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 Last colonoscopy in 2016 was normal by Dr Signe Colt. The rectal bleeding began all of a sudden. My impression at that time was that he had an infective colitis and his symptoms rapidly improved with conservative management . Stool tests were negative. At that time of discharge all his symptoms had resolved. HB was 14.3 grams.    Since discharge has had normal bowel movements, 1-2 bowel movements a day - no blood in stool. Doing well , no abdominal pain.   Current Outpatient Prescriptions  Medication Sig Dispense Refill  . allopurinol (ZYLOPRIM) 300 MG tablet Take 300 mg by mouth daily. *patient takes in the afternoon*    . amLODipine (NORVASC) 10 MG tablet Take 10 mg by mouth daily.     Marland Kitchen aspirin EC 81 MG tablet Take 81 mg by mouth every morning.    Marland Kitchen atorvastatin (LIPITOR) 40 MG tablet Take 40 mg by mouth at bedtime.     . bisacodyl (DULCOLAX) 5 MG EC tablet Take 2 tablets (10 mg total) by mouth at bedtime as needed for moderate constipation ((Hold for loose stool)). (Patient not taking: Reported on 12/04/2016) 100 tablet PRN  . Blood Glucose Monitoring Suppl (FIFTY50 GLUCOSE METER 2.0) w/Device KIT Use as directed. Product selection permitted according to insurance preference. E11.9 Type 2 diabetes mellitus    . Cholecalciferol (VITAMIN D3) 2000 units TABS Take 2,000 Units by mouth every morning.    . ciprofloxacin (CIPRO) 500 MG tablet Take 1 tablet (500 mg total) by mouth 2 (two) times daily. 10 tablet 0  . docusate sodium (COLACE) 100 MG capsule Take 2 capsules (200 mg total) by mouth at bedtime as needed for moderate constipation. Do not use longer than  7 days. (Patient not taking: Reported on 12/04/2016) 60 capsule PRN  . gabapentin (NEURONTIN) 600 MG tablet Take 1 tablet (600 mg total) by mouth 2 (two) times daily. 180 tablet 0  . hydrochlorothiazide (HYDRODIURIL) 25 MG tablet Take 25 mg by mouth daily.     Derrill Memo ON 01/16/2017] HYDROcodone-acetaminophen (NORCO/VICODIN) 5-325 MG tablet Take 1 tablet by mouth 2 (two) times daily as needed for severe pain. 60 tablet 0  . HYDROcodone-acetaminophen (NORCO/VICODIN) 5-325 MG tablet Take 1 tablet by mouth 2 (two) times daily as needed for severe pain. 60 tablet 0  . HYDROcodone-acetaminophen (NORCO/VICODIN) 5-325 MG tablet Take 1 tablet by mouth 2 (two) times daily as needed for severe pain. 60 tablet 0  . isosorbide mononitrate (IMDUR) 30 MG 24 hr tablet Take 30 mg by mouth every morning.   1  . Lancet Devices ([employer] LANCING DEVICE) MISC Use 1 each 3 (three) times daily. Product selection permitted according to insurance preference. E11.9 Type 2 diabetes mellitus    . losartan (COZAAR) 100 MG tablet take 1 tablet by mouth once daily    . metoprolol (LOPRESSOR) 50 MG tablet Take 50 mg by mouth 3 (three) times daily.    . metroNIDAZOLE (FLAGYL) 500 MG tablet Take 1 tablet (500 mg total) by mouth every 8 (eight) hours. 15 tablet 0  . Multiple Vitamin (MULTIVITAMIN WITH MINERALS) TABS tablet Take  1 tablet by mouth every evening.    . nitroGLYCERIN (NITROSTAT) 0.4 MG SL tablet Place 0.4 mg under the tongue every 5 (five) minutes x 3 doses as needed for chest pain.     Marland Kitchen Potassium 95 MG TABS Take 95 mg by mouth every morning.      No current facility-administered medications for this visit.     Allergies as of 01/08/2017  . (No Known Allergies)    ROS:  General: Negative for anorexia, weight loss, fever, chills, fatigue, weakness. ENT: Negative for hoarseness, difficulty swallowing , nasal congestion. CV: Negative for chest pain, angina, palpitations, dyspnea on exertion, peripheral edema.    Respiratory: Negative for dyspnea at rest, dyspnea on exertion, cough, sputum, wheezing.  GI: See history of present illness. GU:  Negative for dysuria, hematuria, urinary incontinence, urinary frequency, nocturnal urination.  Endo: Negative for unusual weight change.    Physical Examination:   There were no vitals taken for this visit.  General: Well-nourished, well-developed in no acute distress.  Eyes: No icterus. Conjunctivae pink. Mouth: Oropharyngeal mucosa moist and pink , no lesions erythema or exudate. Lungs: Clear to auscultation bilaterally. Non-labored. Heart: Regular rate and rhythm, no murmurs rubs or gallops.  Abdomen: Bowel sounds are normal, nontender, nondistended, no hepatosplenomegaly or masses, no abdominal bruits or hernia , no rebound or guarding.   Extremities: No lower extremity edema. No clubbing or deformities. Neuro: Alert and oriented x 3.  Grossly intact. Skin: Warm and dry, no jaundice.   Psych: Alert and cooperative, normal mood and affect.    Imaging Studies: No results found.  Assessment and Plan:   Willman [name]. is a 61 y.o. y/o male who is here to follow up for a single episode of colitis/dysentry which got him admitted to hospital in 12/2016 and his symptoms all resolved rapidly during the hospitalization.   Plan  1. Follow up with PCP. No reason to repeat endoscopy as he had a colonoscopy in 2016 which was normal . If symptoms return then will need re evaluation.   Dr Jonathon Bellows  MD Follow up as needed.

## 2017-01-22 NOTE — Progress Notes (Signed)
Patient's Name: [name] [name].  MRN: [id number]  Referring Provider: Elisabeth Cara, NP  DOB: [birth year]  PCP: Elisabeth Cara, NP  DOS: 02/05/2017  Note by: Vevelyn Francois NP  Service setting: Ambulatory outpatient  Specialty: Interventional Pain Management  Location: [facility] (AMB) Pain Management Facility    Patient type: Established    Primary Reason(s) for Visit: Encounter for prescription drug management (Level of risk: moderate) CC: Back Pain (lower); Chest Pain; and Leg Pain (bilaterally)  HPI  Mr. Burnham is a 61 y.o. year old, male patient, who comes today for a medication management evaluation. He has Apnea, sleep; H/O coronary artery bypass surgery; OSA (obstructive sleep apnea); Hypertension; HLD (hyperlipidemia); Coronary artery disease; Chronic obstructive pulmonary disease ([place]); Alcohol withdrawal syndrome ([place]); Injury of kidney; Chronic low back pain (Bilateral) (R>L); Chronic shoulder pain (Location of Tertiary source of pain) (Right); Chronic hip pain (Left); History of alcoholism ([employer]); Opiate use; Encounter for therapeutic drug level monitoring; Chronic chest wall pain (Location of Primary Source of Pain) (Incisional Midline) (since 04/22/2012); Chronic lower extremity pain (Location of Secondary source of pain) (Bilateral) (R>L); History of MI (myocardial infarction) (January 2014); Encounter for pain management planning; Chronic sacroiliac joint pain (Bilateral) (L>R); Neuropathic pain; Neurogenic pain; Incisional pain; Elevated sedimentation rate; Vitamin D insufficiency; Lumbar facet syndrome (Bilateral) (R>L); Lumbar spondylosis; Long term current use of opiate analgesic; Long term prescription opiate use; Keloid skin disorder; Costochondritis; Unstable angina ([place]); Chest pain; Brachial plexus injury, right; Carotid atherosclerosis; Hypoalbuminemia; Hypokalemia; Right sided weakness; S/P CABG (coronary artery bypass graft); Opioid-induced constipation (OIC); Pneumonia; Pressure  injury of skin; COPD (chronic obstructive pulmonary disease) ([place]); and Chronic pain syndrome on his problem list. His primarily concern today is the Back Pain (lower); Chest Pain; and Leg Pain (bilaterally)  Pain Assessment: Self-Reported Pain Score: 4 /10             Reported level is compatible with observation.       Pain Type: Chronic pain Pain Location: Back Pain Orientation: Lower Pain Descriptors / Indicators: Aching, Constant, Discomfort, Grimacing, Sore, Nagging Pain Frequency: Constant  Mr. Acebo was last scheduled for an appointment on 11/08/16 for medication management. During today's appointment we reviewed Mr. Ayoub chronic pain status, as well as his outpatient medication regimen. He has chronic chest wall pain secondary to bypass surgery 2. He has radicular symptoms that goes into his shoulders. He has numbness tingling in his arms and feet and ankles. He also admits that he has generalized pain. He is concerned about his current medication he admits that when he takes the hydrocodone is effective within 30 minutes original last about hours. He does use the gabapentin but he is only using it twice daily. He denies any change in his condition. He does not wish to have any interventions at this time.  The patient  reports that he does not use drugs. His body mass index is 30.11 kg/m.  Further details on both, my assessment(s), as well as the proposed treatment plan, please see below.  Controlled Substance Pharmacotherapy Assessment REMS (Risk Evaluation and Mitigation Strategy)  Analgesic:Hydrocodone/APAP 5/325 one tablet twice a day (10 mg per day) MME/day:10 mg/day.   Ignatius Specking, RN  02/05/2017 10:16 AM  Sign at close encounter Nursing Pain Medication Assessment:  Safety precautions to be maintained throughout the outpatient stay will include: orient to surroundings, keep bed in low position, maintain call bell within reach at all times, provide assistance with  transfer out of bed  and ambulation.  Medication Inspection Compliance: Pill count conducted under aseptic conditions, in front of the patient. Neither the pills nor the bottle was removed from the patient's sight at any time. Once count was completed pills were immediately returned to the patient in their original bottle.  Medication: See above Pill/Patch Count: 22 of 60 pills remain Pill/Patch Appearance: Markings consistent with prescribed medication Bottle Appearance: Standard pharmacy container. Clearly labeled. Filled Date: 05 / 16 / 2018 Last Medication intake:  Today   Pharmacokinetics: Liberation and absorption (onset of action): WNL Distribution (time to peak effect): WNL Metabolism and excretion (duration of action): WNL         Pharmacodynamics: Desired effects: Analgesia: Mr. Sine reports >50% benefit. Functional ability: Patient reports that medication allows him to accomplish basic ADLs Clinically meaningful improvement in function (CMIF): Sustained CMIF goals met Perceived effectiveness: Described as relatively effective, allowing for increase in activities of daily living (ADL) Undesirable effects: Side-effects or Adverse reactions: None reported Monitoring: [place] PMP: Online review of the past 10-monthperiod conducted. Compliant with practice rules and regulations List of all UDS test(s) done:  Lab Results  Component Value Date   TOXASSSELUR FINAL 01/16/2016   TOXASSSELUR FINAL 12/23/2015   TOXASSSELUR FINAL 10/25/2015   Last UDS on record: ToxAssure Select 13  Date Value Ref Range Status  01/16/2016 FINAL  Final    Comment:    ==================================================================== TOXASSURE SELECT 13 (MW) ==================================================================== Test                             Result       Flag       Units Drug Present and Declared for Prescription Verification   Hydrocodone                    700          EXPECTED    ng/mg creat   Hydromorphone                  168          EXPECTED   ng/mg creat   Dihydrocodeine                 43           EXPECTED   ng/mg creat   Norhydrocodone                 271          EXPECTED   ng/mg creat    Sources of hydrocodone include scheduled prescription    medications. Hydromorphone, dihydrocodeine and norhydrocodone are    expected metabolites of hydrocodone. Hydromorphone and    dihydrocodeine are also available as scheduled prescription    medications. ==================================================================== Test                      Result    Flag   Units      Ref Range   Creatinine              120              mg/dL      >=20 ==================================================================== Declared Medications:  The flagging and interpretation on this report are based on the  following declared medications.  Unexpected results may arise from  inaccuracies in the declared medications.  **Note: The testing scope of this panel includes these medications:  Hydrocodone (  Hydrocodone-Acetaminophen)  **Note: The testing scope of this panel does not include following  reported medications:  Acetaminophen (Hydrocodone-Acetaminophen)  Allopurinol  Amlodipine  Aspirin  Atorvastatin  Cholecalciferol  Gabapentin  Hydrochlorothiazide  Losartan (Losartan Potassium)  Meloxicam  Metoprolol  Multivitamin  Vitamin D2 (Ergocalciferol) ==================================================================== For clinical consultation, please call [phone]. ====================================================================    UDS interpretation: Compliant          Medication Assessment Form: Reviewed. Patient indicates being compliant with therapy Treatment compliance: Compliant Risk Assessment Profile: Aberrant behavior: See prior evaluations. None observed or detected today Comorbid factors increasing risk of overdose: See prior notes. No  additional risks detected today Risk of substance use disorder (SUD): Low Opioid Risk Tool (ORT) Total Score:    Interpretation Table:  Score <3 = Low Risk for SUD  Score between 4-7 = Moderate Risk for SUD  Score >8 = High Risk for Opioid Abuse   Risk Mitigation Strategies:  Patient Counseling: Covered Patient-Prescriber Agreement (PPA): Present and active  Notification to other healthcare providers: Done  Pharmacologic Plan: No change in therapy, at this time  Laboratory Chemistry  Inflammation Markers Lab Results  Component Value Date   CRP <0.5 10/26/2015   ESRSEDRATE 32 (H) 10/26/2015   (CRP: Acute Phase) (ESR: Chronic Phase) Renal Function Markers Lab Results  Component Value Date   BUN 15 12/05/2016   CREATININE 1.00 12/05/2016   GFRAA >60 12/05/2016   GFRNONAA >60 12/05/2016   Hepatic Function Markers Lab Results  Component Value Date   AST 63 (H) 12/04/2016   ALT 55 12/04/2016   ALBUMIN 4.7 12/04/2016   ALKPHOS 62 12/04/2016   Electrolytes Lab Results  Component Value Date   NA 137 12/05/2016   K 3.5 12/05/2016   CL 104 12/05/2016   CALCIUM 8.7 (L) 12/05/2016   MG 2.0 10/26/2015   Neuropathy Markers No results found for: [id number] Bone Pathology Markers Lab Results  Component Value Date   ALKPHOS 62 12/04/2016   VD25OH 26.5 (L) 10/26/2015   VD125OH2TOT 30.8 10/26/2015   CALCIUM 8.7 (L) 12/05/2016   Coagulation Parameters Lab Results  Component Value Date   INR 0.97 02/09/2015   LABPROT 13.1 02/09/2015   APTT > 160.0 (HH) 04/21/2012   PLT 175 12/05/2016   Cardiovascular Markers Lab Results  Component Value Date   BNP 255 (H) 08/01/2014   HGB 14.3 12/06/2016   HCT 37.0 (L) 12/05/2016   Note: Lab results reviewed.  Recent Diagnostic Imaging Review  Ct Abdomen Pelvis W Contrast  Result Date: 12/04/2016 CLINICAL DATA:  Left lower quadrant pain with intermittent constipation and diarrhea for the past week. Today having bright red blood  in stool. EXAM: CT ABDOMEN AND PELVIS WITH CONTRAST TECHNIQUE: Multidetector CT imaging of the abdomen and pelvis was performed using the standard protocol following bolus administration of intravenous contrast. CONTRAST:  198m ISOVUE-300 IOPAMIDOL (ISOVUE-300) INJECTION 61% COMPARISON:  03/20/2015 CT FINDINGS: Lower chest: Normal size cardiac chambers. Minimal scarring at the left lung base and right middle lobe. No effusion or pulmonary consolidation. Hepatobiliary: Hepatic steatosis.  Normal gallbladder. Pancreas: Unremarkable Spleen: Unremarkable Adrenals/Urinary Tract: Adrenal glands are unremarkable. Kidneys are normal, without renal calculi, focal lesion, or hydronephrosis. Bladder is unremarkable. Stomach/Bowel: Diffuse transmural thickening of descending colon through rectum consistent with proctocolitis. No bowel obstruction, perforation or abscess. Duodenal diverticulum however is of incidental note off the second portion, otherwise the small bowel is unremarkable. Normal appendix. Vascular/Lymphatic: Aortic and branch vessel atherosclerosis without aneurysm or dissection. No enlarged  lymph nodes by CT criteria. Reproductive: Scattered peripheral calcifications of along the corpora cavernosa in the region of the tunica which can be seen with Peyronie disease. Other: No abdominal wall hernia or abnormality. No abdominopelvic ascites. Fat noted in the left inguinal canal. Musculoskeletal: Degenerative disc disease L4-5 and L5-S1 with slight retrolisthesis grade 1 of L5 on S1. IMPRESSION: 1. Diffuse transmural thickening from descending colon through rectum consistent with proctocolitis. 2. Hepatic steatosis. 3. Lower lumbar degenerative disc disease L4-5 and L5-S1. 4. Calcifications along the tunica of penis which can be seen with Peyronie disease. Electronically Signed   By: Ashley Royalty M.D.   On: 12/04/2016 19:01   Note: Imaging results reviewed.          Meds  The patient has a current medication  list which includes the following prescription(s): allopurinol, amlodipine, aspirin ec, atorvastatin, fifty50 glucose meter 2.0, vitamin d3, gabapentin, hydrochlorothiazide, hydrocodone-acetaminophen, isosorbide mononitrate, [employer]vs lancing device, losartan, metoprolol tartrate, multivitamin with minerals, nitroglycerin, potassium, hydrocodone-acetaminophen, and hydrocodone-acetaminophen.  Current Outpatient Prescriptions on File Prior to Visit  Medication Sig   allopurinol (ZYLOPRIM) 300 MG tablet Take 300 mg by mouth daily. *patient takes in the afternoon*   amLODipine (NORVASC) 10 MG tablet Take 10 mg by mouth daily.    aspirin EC 81 MG tablet Take 81 mg by mouth every morning.   atorvastatin (LIPITOR) 40 MG tablet Take 40 mg by mouth at bedtime.    Blood Glucose Monitoring Suppl (FIFTY50 GLUCOSE METER 2.0) w/Device KIT Use as directed. Product selection permitted according to insurance preference. E11.9 Type 2 diabetes mellitus   Cholecalciferol (VITAMIN D3) 2000 units TABS Take 2,000 Units by mouth every morning.   gabapentin (NEURONTIN) 600 MG tablet Take 1 tablet (600 mg total) by mouth 2 (two) times daily.   hydrochlorothiazide (HYDRODIURIL) 25 MG tablet Take 25 mg by mouth daily.    isosorbide mononitrate (IMDUR) 30 MG 24 hr tablet Take 30 mg by mouth every morning.    Lancet Devices ([employer] LANCING DEVICE) MISC Use 1 each 3 (three) times daily. Product selection permitted according to insurance preference. E11.9 Type 2 diabetes mellitus   losartan (COZAAR) 100 MG tablet take 1 tablet by mouth once daily   metoprolol (LOPRESSOR) 50 MG tablet Take 50 mg by mouth 3 (three) times daily.   Multiple Vitamin (MULTIVITAMIN WITH MINERALS) TABS tablet Take 1 tablet by mouth every evening.   nitroGLYCERIN (NITROSTAT) 0.4 MG SL tablet Place 0.4 mg under the tongue every 5 (five) minutes x 3 doses as needed for chest pain.    Potassium 95 MG TABS Take 95 mg by mouth every morning.    No  current facility-administered medications on file prior to visit.    ROS  Constitutional: Denies any fever or chills Gastrointestinal: No reported hemesis, hematochezia, vomiting, or acute GI distress Musculoskeletal: Denies any acute onset joint swelling, redness, loss of ROM, or weakness Neurological: No reported episodes of acute onset apraxia, aphasia, dysarthria, agnosia, amnesia, paralysis, loss of coordination, or loss of consciousness  Allergies  Mr. Bechtol has No Known Allergies.  PFSH  Drug: Mr. Yadav  reports that he does not use drugs. Alcohol:  reports that he drinks alcohol. Tobacco:  reports that he has quit smoking. He quit after 25.00 years of use. He has quit using smokeless tobacco. Medical:  has a past medical history of Benign esophageal stricture; Cardiomyopathy, secondary ([place]); Coronary artery disease; Diverticulitis; GI bleed; Gout; Hemorrhoids; Hyperlipidemia; Hypertension; Sleep apnea; and Tubular adenoma of  colon. Family: family history includes Heart disease in his mother; Hypertension in his father.  Past Surgical History:  Procedure Laterality Date   cardiac bypass     CARDIAC CATHETERIZATION Left 02/03/2016   Procedure: Left Heart Cath and Coronary Angiography;  Surgeon: Teodoro Spray, MD;  Location: [facility];  Service: Cardiovascular;  Laterality: Left;   CARDIAC SURGERY     COLONOSCOPY WITH PROPOFOL N/A 04/29/2015   Procedure: COLONOSCOPY WITH PROPOFOL;  Surgeon: Hulen Luster, MD;  Location: [facility] ENDOSCOPY;  Service: Gastroenterology;  Laterality: N/A;   CORONARY ANGIOPLASTY WITH STENT PLACEMENT     x2   CORONARY ARTERY BYPASS GRAFT     ESOPHAGOGASTRODUODENOSCOPY (EGD) WITH PROPOFOL N/A 04/29/2015   Procedure: ESOPHAGOGASTRODUODENOSCOPY (EGD) WITH PROPOFOL;  Surgeon: Hulen Luster, MD;  Location: [facility] ENDOSCOPY;  Service: Gastroenterology;  Laterality: N/A;   SEPTOPLASTY     Constitutional Exam  General appearance: Well nourished, well  developed, and well hydrated. In no apparent acute distress Vitals:   02/05/17 1000  BP: (!) 116/56  Pulse: 71  Resp: 16  Temp: 97.7 F (36.5 C)  SpO2: 96%  Weight: 170 lb (77.1 kg)  Height: '5\' 3"'  (1.6 m)   BMI Assessment: Estimated body mass index is 30.11 kg/m as calculated from the following:   Height as of this encounter: '5\' 3"'  (1.6 m).   Weight as of this encounter: 170 lb (77.1 kg).  BMI interpretation table: BMI level Category Range association with higher incidence of chronic pain  <18 kg/m2 Underweight   18.5-24.9 kg/m2 Ideal body weight   25-29.9 kg/m2 Overweight Increased incidence by 20%  30-34.9 kg/m2 Obese (Class I) Increased incidence by 68%  35-39.9 kg/m2 Severe obesity (Class II) Increased incidence by 136%  >40 kg/m2 Extreme obesity (Class III) Increased incidence by 254%   BMI Readings from Last 4 Encounters:  02/05/17 30.11 kg/m  01/08/17 28.09 kg/m  12/04/16 30.11 kg/m  11/08/16 29.23 kg/m   Wt Readings from Last 4 Encounters:  02/05/17 170 lb (77.1 kg)  01/08/17 168 lb 12.8 oz (76.6 kg)  12/04/16 170 lb (77.1 kg)  11/08/16 165 lb (74.8 kg)  Psych/Mental status: Alert, oriented x 3 (person, place, & time)       Eyes: PERLA Respiratory: No evidence of acute respiratory distress  Cervical Spine Exam  Inspection: No masses, redness, or swelling Alignment: Symmetrical Functional ROM: Unrestricted ROM      Stability: No instability detected Muscle strength & Tone: Functionally intact Sensory: Unimpaired Palpation: No palpable anomalies              Upper Extremity (UE) Exam    Side: Right upper extremity  Side: Left upper extremity  Inspection: No masses, redness, swelling, or asymmetry. No contractures  Inspection: No masses, redness, swelling, or asymmetry. No contractures  Functional ROM: Unrestricted ROM          Functional ROM: Unrestricted ROM          Muscle strength & Tone: Functionally intact  Muscle strength & Tone: Functionally  intact  Sensory: Unimpaired  Sensory: Unimpaired  Palpation: No palpable anomalies              Palpation: No palpable anomalies              Specialized Test(s): Deferred         Specialized Test(s): Deferred          Thoracic Spine Exam  Inspection: No masses, redness, or swelling Alignment:  Symmetrical Functional ROM: Unrestricted ROM Stability: No instability detected Sensory: Unimpaired Muscle strength & Tone: No palpable anomalies  Lumbar Spine Exam  Inspection: No masses, redness, or swelling Alignment: Symmetrical Functional ROM: Unrestricted ROM      Stability: No instability detected Muscle strength & Tone: Functionally intact Sensory: Unimpaired Palpation: No palpable anomalies       Provocative Tests: Lumbar Hyperextension and rotation test: evaluation deferred today       Patrick's Maneuver: evaluation deferred today                    Gait & Posture Assessment  Ambulation: Unassisted Gait: Relatively normal for age and body habitus Posture: WNL   Lower Extremity Exam    Side: Right lower extremity  Side: Left lower extremity  Inspection: No masses, redness, swelling, or asymmetry. No contractures  Inspection: No masses, redness, swelling, or asymmetry. No contractures  Functional ROM: Unrestricted ROM          Functional ROM: Unrestricted ROM          Muscle strength & Tone: Functionally intact  Muscle strength & Tone: Functionally intact  Sensory: Unimpaired  Sensory: Unimpaired  Palpation: No palpable anomalies  Palpation: No palpable anomalies   Assessment  Primary Diagnosis & Pertinent Problem List: The primary encounter diagnosis was Chronic chest wall pain (Location of Primary Source of Pain) (Incisional Midline) (since 04/22/2012). Diagnoses of Chronic lower extremity pain (Location of Secondary source of pain) (Bilateral) (R>L), Neurogenic pain, Chronic shoulder pain (Location of Tertiary source of pain) (Right), Chronic pain syndrome, and Long term  current use of opiate analgesic were also pertinent to this visit.  Status Diagnosis  Controlled Controlled Controlled 1. Chronic chest wall pain (Location of Primary Source of Pain) (Incisional Midline) (since 04/22/2012)   2. Chronic lower extremity pain (Location of Secondary source of pain) (Bilateral) (R>L)   3. Neurogenic pain   4. Chronic shoulder pain (Location of Tertiary source of pain) (Right)   5. Chronic pain syndrome   6. Long term current use of opiate analgesic     Problems updated and reviewed during this visit: No problems updated. Plan of Care  Pharmacotherapy (Medications Ordered): Meds ordered this encounter  Medications   HYDROcodone-acetaminophen (NORCO/VICODIN) 5-325 MG tablet    Sig: Take 1 tablet by mouth 2 (two) times daily as needed for severe pain.    Dispense:  60 tablet    Refill:  0    Patient may have prescription filled one day early if pharmacy is closed on scheduled refill date. Do not fill until: 02/15/17 To last until: 03/17/17    Order Specific Question:   Supervising Provider    Answer:   Milinda Pointer [phone]   HYDROcodone-acetaminophen (NORCO/VICODIN) 5-325 MG tablet    Sig: Take 1 tablet by mouth 2 (two) times daily as needed for severe pain.    Dispense:  60 tablet    Refill:  0    Patient may have prescription filled one day early if pharmacy is closed on scheduled refill date. Do not fill until: 03/17/17 To last until: 04/16/17    Order Specific Question:   Supervising Provider    Answer:   Milinda Pointer [phone]   HYDROcodone-acetaminophen (NORCO/VICODIN) 5-325 MG tablet    Sig: Take 1 tablet by mouth 2 (two) times daily as needed for severe pain.    Dispense:  60 tablet    Refill:  0    Patient may have prescription filled one  day early if pharmacy is closed on scheduled refill date. Do not fill until: 04/16/17 To last until: 05/16/17    Order Specific Question:   Supervising Provider    Answer:   Milinda Pointer [phone]   New Prescriptions   No medications on file   Medications administered today: Mr. Culp had no medications administered during this visit. Lab-work, procedure(s), and/or referral(s): No orders of the defined types were placed in this encounter.  Imaging and/or referral(s): None  Interventional therapies: Planned, scheduled, and/or pending:   Continue with current regimen  Refill on gabapentin not need today Did give information on drug on the and encouraged interventional therapy    Considering:   Lumbar facet and sacroiliac joint radiofrequency ablation.   Palliative PRN treatment(s):   Diagnostic left intra-articular hip joint injection   Provider-requested follow-up: Return in about 3 months (around 05/08/2017) for Medication Mgmt.  No future appointments. Primary Care Physician: Elisabeth Cara, NP Location: [facility] Outpatient [facility] Management [facility] Note by: Vevelyn Francois NP Date: 02/05/2017; Time: 10:53 AM  Pain Score Disclaimer: We use the NRS-11 scale. This is a self-reported, subjective measurement of pain severity with only modest accuracy. It is used primarily to identify changes within a particular patient. It must be understood that outpatient pain scales are significantly less accurate that those used for research, where they can be applied under ideal controlled circumstances with minimal exposure to variables. In reality, the score is likely to be a combination of pain intensity and pain affect, where pain affect describes the degree of emotional arousal or changes in action readiness caused by the sensory experience of pain. Factors such as social and work situation, setting, emotional state, anxiety levels, expectation, and prior pain experience may influence pain perception and show large inter-individual differences that may also be affected by time variables.  Patient instructions provided during this appointment: Patient Instructions     ____________________________________________________________________________________________  Medication Rules  Applies to: All patients receiving prescriptions (written or electronic).  Pharmacy of record: Pharmacy where electronic prescriptions will be sent. If written prescriptions are taken to a different pharmacy, please inform the nursing staff. The pharmacy listed in the electronic medical record should be the one where you would like electronic prescriptions to be sent.  Prescription refills: Only during scheduled appointments. Applies to both, written and electronic prescriptions.  NOTE: The following applies primarily to controlled substances (Opioid Pain Medications)  Patient's responsibilities: 1. Pain Pills: Bring all pain pills to every appointment (except for procedure appointments). 2. Pill Bottles: Bring pills in original pharmacy bottle. Always bring newest bottle. Bring bottle, even if empty. 3. Medication refills: You are responsible for knowing and keeping track of what medications you need refilled. The day before your appointment, write a list of all prescriptions that need to be refilled. Bring that list to your appointment and give it to the admitting nurse. Prescriptions will be written only during appointments. If you forget a medication, it will not be "Called in", "Faxed", or "electronically sent". You will need to get another appointment to get these prescribed. 4. Prescription Accuracy: You are responsible for carefully inspecting your prescriptions before leaving our office. Have the discharge nurse carefully go over each prescription with you, before taking them home. Make sure that your name is accurately spelled, that your address is correct. Check the name and dose of your medication to make sure it is accurate. Check the number of pills, and the written instructions to make sure they are  clear and accurate. Make sure that you are given enough medication to  last until your next medication refill appointment. 5. Taking Medication: Take medication as prescribed. Never take more pills than instructed. Never take medication more frequently than prescribed. Taking less pills or less frequently is permitted and encouraged, when it comes to controlled substances (written prescriptions).  6. Inform other Doctors: Always inform, all of your healthcare providers, of all the medications you take. 7. Pain Medication from other Providers: You are not allowed to accept any additional pain medication from any other Doctor or Healthcare provider. There are two exceptions to this rule. (see below) In the event that you require additional pain medication, you are responsible for notifying us, as stated below. 8. Medication Agreement: You are responsible for carefully reading and following our Medication Agreement. This must be signed before receiving any prescriptions from our practice. Safely store a copy of your signed Agreement. Violations to the Agreement will result in no further prescriptions. (Additional copies of our Medication Agreement are available upon request.) 9. Laws, Rules, & Regulations: All patients are expected to follow all Federal and [employer], Rules, [employer]. Ignorance of the Laws does not constitute a valid excuse.  Exceptions: There are only two exceptions to the rule of not receiving pain medications from other Healthcare Providers. 1. Exception #1 (Emergencies): In the event of an emergency (i.e.: accident requiring emergency care), you are allowed to receive additional pain medication. However, you are responsible for: As soon as you are able, call our office [phone], at any time of the day or night, and leave a message stating your name, the date and nature of the emergency, and the name and dose of the medication prescribed. In the event that your call is answered by a member of our staff, make sure to document and save the  date, time, and the name of the person that took your information.  2. Exception #2 (Planned Surgery): In the event that you are scheduled by another doctor or dentist to have any type of surgery or procedure, you are allowed (for a period no longer than 30 days), to receive additional pain medication, for the acute post-op pain. However, in this case, you are responsible for picking up a copy of our "Post-op Pain Management for Surgeons" handout, and giving it to your surgeon or dentist. This document is available at our office, and does not require an appointment to obtain it. Simply go to our office during business hours (Monday-Thursday from 8:00 AM to 4:00 PM) (Friday 8:00 AM to 12:00 Noon) or if you have a scheduled appointment with [place], prior to your surgery, and ask for it by name. In addition, you will need to provide [place] with your name, name of your surgeon, type of surgery, and date of procedure or surgery.  ____________________________________________________________________________________________ ____________________________________________________________________________________________  DRUG HOLIDAYS  Definitions Tolerance: defined as the progressively decreased responsiveness to a drug. Occurs when the drug is used repeatedly and the body adapts to the continued presence of the drug. As a result, a larger dose of the drug is needed to achieve the effect originally obtained by a smaller dose. It is thought to be due to the formation of excess opioid receptors.  Drug Holiday: is when a patient stops taking a medication(s) for a period of time; anywhere from a few days to several weeks.  Withdrawals: refers to the wide range of symptoms that occur after stopping or dramatically reducing opiate drugs after  heavy and prolonged use. Withdrawal symptoms do not occur to patients that use low dose opioids, or those who take the medication sporadically. Contrary to benzodiazepine (example: Valium,  Xanax, etc.) or alcohol withdrawals (Delirium Tremens), opioid withdrawals are not lethal. Withdrawals are the physical manifestation of the body getting rid of the excess receptors.  Purpose To eliminate tolerance.  Duration of Holiday 14 consecutive days. (2 weeks)  Expected Symptoms Early symptoms of withdrawal include:  Agitation  Anxiety  Muscle aches  Increased tearing  Insomnia  Runny nose  Sweating  Yawning  Late symptoms of withdrawal include:  Abdominal cramping  Diarrhea  Dilated pupils  Goose bumps  Nausea  Vomiting  Opioid withdrawal reactions are very uncomfortable but are not life-threatening. Symptoms usually start within 12 hours of last opioid dose and within 30 hours of last methadone exposure.  Duration of Symptoms 48 to 72 hours for short acting medications and 2 to 14 days for methadone.  Treatment  Clonidine (Catapres) or tizanidine (Zanaflex) for agitation, sweating, tearing, runny nose.  Promethazine (Phenergan) for nausea, vomiting.  NSAIDs for pain.  Benefits  Improved effectiveness of opioids.  Decreased opioid dose needed to achieve benefits.  Improved pain with lesser dose. ____________________________________________________________________________________________  ____________________________________________________________________________________________  Pain Scale  Introduction: The pain score used by this practice is the Verbal Numerical Rating Scale (VNRS-11). This is an 11-point scale. It is for adults and children 10 years or older. There are significant differences in how the pain score is reported, used, and applied. Forget everything you learned in the past and learn this scoring system.  General Information: The scale should reflect your current level of pain. Unless you are specifically asked for the level of your worst pain, or your average pain. If you are asked for one of these two, then it  should be understood that it is over the past 24 hours.  Basic Activities of Daily Living (ADL): Personal hygiene, dressing, eating, transferring, and using restroom.  Instructions: Most patients tend to report their level of pain as a combination of two factors, their physical pain and their psychosocial pain. This last one is also known as suffering and it is reflection of how physical pain affects you socially and psychologically. From now on, report them separately. From this point on, when asked to report your pain level, report only your physical pain. Use the following table for reference.  Pain Clinic Pain Levels (0-5/10)  Pain Level Score  Description  No Pain 0   Mild pain 1 Nagging, annoying, but does not interfere with basic activities of daily living (ADL). Patients are able to eat, bathe, get dressed, toileting (being able to get on and off the toilet and perform personal hygiene functions), transfer (move in and out of bed or a chair without assistance), and maintain continence (able to control bladder and bowel functions). Blood pressure and heart rate are unaffected. A normal heart rate for a healthy adult ranges from 60 to 100 bpm (beats per minute).   Mild to moderate pain 2 Noticeable and distracting. Impossible to hide from other people. More frequent flare-ups. Still possible to adapt and function close to normal. It can be very annoying and may have occasional stronger flare-ups. With discipline, patients may get used to it and adapt.   Moderate pain 3 Interferes significantly with activities of daily living (ADL). It becomes difficult to feed, bathe, get dressed, get on and off the toilet or to perform personal hygiene functions. Difficult to get  in and out of bed or a chair without assistance. Very distracting. With effort, it can be ignored when deeply involved in activities.   Moderately severe pain 4 Impossible to ignore for more than a few minutes. With effort, patients  may still be able to manage work or participate in some social activities. Very difficult to concentrate. Signs of autonomic nervous system discharge are evident: dilated pupils (mydriasis); mild sweating (diaphoresis); sleep interference. Heart rate becomes elevated (>115 bpm). Diastolic blood pressure (lower number) rises above 100 mmHg. Patients find relief in laying down and not moving.   Severe pain 5 Intense and extremely unpleasant. Associated with frowning face and frequent crying. Pain overwhelms the senses.  Ability to do any activity or maintain social relationships becomes significantly limited. Conversation becomes difficult. Pacing back and forth is common, as getting into a comfortable position is nearly impossible. Pain wakes you up from deep sleep. Physical signs will be obvious: pupillary dilation; increased sweating; goosebumps; brisk reflexes; cold, clammy hands and feet; nausea, vomiting or dry heaves; loss of appetite; significant sleep disturbance with inability to fall asleep or to remain asleep. When persistent, significant weight loss is observed due to the complete loss of appetite and sleep deprivation.  Blood pressure and heart rate becomes significantly elevated. Caution: If elevated blood pressure triggers a pounding headache associated with blurred vision, then the patient should immediately seek attention at an urgent or emergency care unit, as these may be signs of an impending stroke.    Emergency Department Pain Levels (6-10/10)  Emergency Room Pain 6 Severely limiting. Requires emergency care and should not be seen or managed at an outpatient pain management facility. Communication becomes difficult and requires great effort. Assistance to reach the emergency department may be required. Facial flushing and profuse sweating along with potentially dangerous increases in heart rate and blood pressure will be evident.   Distressing pain 7 Self-care is very difficult.  Assistance is required to transport, or use restroom. Assistance to reach the emergency department will be required. Tasks requiring coordination, such as bathing and getting dressed become very difficult.   Disabling pain 8 Self-care is no longer possible. At this level, pain is disabling. The individual is unable to do even the most basic activities such as walking, eating, bathing, dressing, transferring to a bed, or toileting. Fine motor skills are lost. It is difficult to think clearly.   Incapacitating pain 9 Pain becomes incapacitating. Thought processing is no longer possible. Difficult to remember your own name. Control of movement and coordination are lost.   The worst pain imaginable 10 At this level, most patients pass out from pain. When this level is reached, collapse of the autonomic nervous system occurs, leading to a sudden drop in blood pressure and heart rate. This in turn results in a temporary and dramatic drop in blood flow to the brain, leading to a loss of consciousness. Fainting is one of the bodys self defense mechanisms. Passing out puts the brain in a calmed state and causes it to shut down for a while, in order to begin the healing process.    Summary: 1. Refer to this scale when providing [place] with your pain level. 2. Be accurate and careful when reporting your pain level. This will help with your care. 3. Over-reporting your pain level will lead to loss of credibility. 4. Even a level of 1/10 means that there is pain and will be treated at our facility. 5. High, inaccurate reporting will be  documented as Symptom Exaggeration, leading to loss of credibility and suspicions of possible secondary gains such as obtaining more narcotics, or wanting to appear disabled, for fraudulent reasons. 6. Only pain levels of 5 or below will be seen at our facility. 7. Pain levels of 6 and above will be sent to the Emergency Department and the appointment  cancelled. ____________________________________________________________________________________________

## 2017-02-05 ENCOUNTER — Ambulatory Visit: Payer: Medicare Other | Attending: Nurse Practitioner | Admitting: Nurse Practitioner

## 2017-02-05 ENCOUNTER — Encounter: Payer: Self-pay | Admitting: Nurse Practitioner

## 2017-02-05 VITALS — BP 116/56 | HR 71 | Temp 97.7°F | Resp 16 | Ht 63.0 in | Wt 170.0 lb

## 2017-02-05 DIAGNOSIS — G8929 Other chronic pain: Secondary | ICD-10-CM | POA: Insufficient documentation

## 2017-02-05 DIAGNOSIS — R079 Chest pain, unspecified: Secondary | ICD-10-CM | POA: Diagnosis present

## 2017-02-05 DIAGNOSIS — I252 Old myocardial infarction: Secondary | ICD-10-CM | POA: Diagnosis not present

## 2017-02-05 DIAGNOSIS — K760 Fatty (change of) liver, not elsewhere classified: Secondary | ICD-10-CM | POA: Diagnosis not present

## 2017-02-05 DIAGNOSIS — M25552 Pain in left hip: Secondary | ICD-10-CM | POA: Insufficient documentation

## 2017-02-05 DIAGNOSIS — F1021 Alcohol dependence, in remission: Secondary | ICD-10-CM | POA: Diagnosis not present

## 2017-02-05 DIAGNOSIS — M79605 Pain in left leg: Secondary | ICD-10-CM | POA: Diagnosis not present

## 2017-02-05 DIAGNOSIS — G4733 Obstructive sleep apnea (adult) (pediatric): Secondary | ICD-10-CM | POA: Insufficient documentation

## 2017-02-05 DIAGNOSIS — Z7982 Long term (current) use of aspirin: Secondary | ICD-10-CM | POA: Diagnosis not present

## 2017-02-05 DIAGNOSIS — Z87891 Personal history of nicotine dependence: Secondary | ICD-10-CM | POA: Insufficient documentation

## 2017-02-05 DIAGNOSIS — M533 Sacrococcygeal disorders, not elsewhere classified: Secondary | ICD-10-CM | POA: Insufficient documentation

## 2017-02-05 DIAGNOSIS — Z79891 Long term (current) use of opiate analgesic: Secondary | ICD-10-CM | POA: Insufficient documentation

## 2017-02-05 DIAGNOSIS — E785 Hyperlipidemia, unspecified: Secondary | ICD-10-CM | POA: Insufficient documentation

## 2017-02-05 DIAGNOSIS — M792 Neuralgia and neuritis, unspecified: Secondary | ICD-10-CM

## 2017-02-05 DIAGNOSIS — M79604 Pain in right leg: Secondary | ICD-10-CM | POA: Diagnosis not present

## 2017-02-05 DIAGNOSIS — Z951 Presence of aortocoronary bypass graft: Secondary | ICD-10-CM | POA: Diagnosis not present

## 2017-02-05 DIAGNOSIS — I25110 Atherosclerotic heart disease of native coronary artery with unstable angina pectoris: Secondary | ICD-10-CM | POA: Diagnosis not present

## 2017-02-05 DIAGNOSIS — G894 Chronic pain syndrome: Secondary | ICD-10-CM

## 2017-02-05 DIAGNOSIS — M109 Gout, unspecified: Secondary | ICD-10-CM | POA: Diagnosis not present

## 2017-02-05 DIAGNOSIS — J449 Chronic obstructive pulmonary disease, unspecified: Secondary | ICD-10-CM | POA: Insufficient documentation

## 2017-02-05 DIAGNOSIS — M25511 Pain in right shoulder: Secondary | ICD-10-CM | POA: Diagnosis not present

## 2017-02-05 DIAGNOSIS — I10 Essential (primary) hypertension: Secondary | ICD-10-CM | POA: Insufficient documentation

## 2017-02-05 DIAGNOSIS — K5903 Drug induced constipation: Secondary | ICD-10-CM | POA: Diagnosis not present

## 2017-02-05 DIAGNOSIS — R0789 Other chest pain: Secondary | ICD-10-CM | POA: Diagnosis not present

## 2017-02-05 DIAGNOSIS — M545 Low back pain: Secondary | ICD-10-CM | POA: Diagnosis present

## 2017-02-05 DIAGNOSIS — I429 Cardiomyopathy, unspecified: Secondary | ICD-10-CM | POA: Insufficient documentation

## 2017-02-05 DIAGNOSIS — E559 Vitamin D deficiency, unspecified: Secondary | ICD-10-CM | POA: Insufficient documentation

## 2017-02-05 DIAGNOSIS — M940 Chondrocostal junction syndrome [Tietze]: Secondary | ICD-10-CM | POA: Insufficient documentation

## 2017-02-05 DIAGNOSIS — Z955 Presence of coronary angioplasty implant and graft: Secondary | ICD-10-CM | POA: Insufficient documentation

## 2017-02-05 DIAGNOSIS — M47816 Spondylosis without myelopathy or radiculopathy, lumbar region: Secondary | ICD-10-CM | POA: Insufficient documentation

## 2017-02-05 MED ORDER — HYDROCODONE-ACETAMINOPHEN 5-325 MG PO TABS: 1.0000 | ORAL_TABLET | Freq: Two times a day (BID) | ORAL | 0 refills | Status: DC | PRN

## 2017-02-05 NOTE — Patient Instructions (Addendum)
____________________________________________________________________________________________  Medication Rules  Applies to: All patients receiving prescriptions (written or electronic).  Pharmacy of record: Pharmacy where electronic prescriptions will be sent. If written prescriptions are taken to a different pharmacy, please inform the nursing staff. The pharmacy listed in the electronic medical record should be the one where you would like electronic prescriptions to be sent.  Prescription refills: Only during scheduled appointments. Applies to both, written and electronic prescriptions.  NOTE: The following applies primarily to controlled substances (Opioid Pain Medications)  Patient's responsibilities: 1. Pain Pills: Bring all pain pills to every appointment (except for procedure appointments). 2. Pill Bottles: Bring pills in original pharmacy bottle. Always bring newest bottle. Bring bottle, even if empty. 3. Medication refills: You are responsible for knowing and keeping track of what medications you need refilled. The day before your appointment, write a list of all prescriptions that need to be refilled. Bring that list to your appointment and give it to the admitting nurse. Prescriptions will be written only during appointments. If you forget a medication, it will not be "Called in", "Faxed", or "electronically sent". You will need to get another appointment to get these prescribed. 4. Prescription Accuracy: You are responsible for carefully inspecting your prescriptions before leaving our office. Have the discharge nurse carefully go over each prescription with you, before taking them home. Make sure that your name is accurately spelled, that your address is correct. Check the name and dose of your medication to make sure it is accurate. Check the number of pills, and the written instructions to make sure they are clear and accurate. Make sure that you are given enough medication to last  until your next medication refill appointment. 5. Taking Medication: Take medication as prescribed. Never take more pills than instructed. Never take medication more frequently than prescribed. Taking less pills or less frequently is permitted and encouraged, when it comes to controlled substances (written prescriptions).  6. Inform other Doctors: Always inform, all of your healthcare providers, of all the medications you take. 7. Pain Medication from other Providers: You are not allowed to accept any additional pain medication from any other Doctor or Healthcare provider. There are two exceptions to this rule. (see below) In the event that you require additional pain medication, you are responsible for notifying us, as stated below. 8. Medication Agreement: You are responsible for carefully reading and following our Medication Agreement. This must be signed before receiving any prescriptions from our practice. Safely store a copy of your signed Agreement. Violations to the Agreement will result in no further prescriptions. (Additional copies of our Medication Agreement are available upon request.) 9. Laws, Rules, & Regulations: All patients are expected to follow all Federal and [employer], Rules, [employer]. Ignorance of the Laws does not constitute a valid excuse.  Exceptions: There are only two exceptions to the rule of not receiving pain medications from other Healthcare Providers. 1. Exception #1 (Emergencies): In the event of an emergency (i.e.: accident requiring emergency care), you are allowed to receive additional pain medication. However, you are responsible for: As soon as you are able, call our office [phone], at any time of the day or night, and leave a message stating your name, the date and nature of the emergency, and the name and dose of the medication prescribed. In the event that your call is answered by a member of our staff, make sure to document and save the date,  time, and the name of the person that  took your information.  2. Exception #2 (Planned Surgery): In the event that you are scheduled by another doctor or dentist to have any type of surgery or procedure, you are allowed (for a period no longer than 30 days), to receive additional pain medication, for the acute post-op pain. However, in this case, you are responsible for picking up a copy of our "Post-op Pain Management for Surgeons" handout, and giving it to your surgeon or dentist. This document is available at our office, and does not require an appointment to obtain it. Simply go to our office during business hours (Monday-Thursday from 8:00 AM to 4:00 PM) (Friday 8:00 AM to 12:00 Noon) or if you have a scheduled appointment with [place], prior to your surgery, and ask for it by name. In addition, you will need to provide [place] with your name, name of your surgeon, type of surgery, and date of procedure or surgery.  ____________________________________________________________________________________________ ____________________________________________________________________________________________  DRUG HOLIDAYS  Definitions Tolerance: defined as the progressively decreased responsiveness to a drug. Occurs when the drug is used repeatedly and the body adapts to the continued presence of the drug. As a result, a larger dose of the drug is needed to achieve the effect originally obtained by a smaller dose. It is thought to be due to the formation of excess opioid receptors.  Drug Holiday: is when a patient stops taking a medication(s) for a period of time; anywhere from a few days to several weeks.  Withdrawals: refers to the wide range of symptoms that occur after stopping or dramatically reducing opiate drugs after heavy and prolonged use. Withdrawal symptoms do not occur to patients that use low dose opioids, or those who take the medication sporadically. Contrary to benzodiazepine (example: Valium, Xanax,  etc.) or alcohol withdrawals (Delirium Tremens), opioid withdrawals are not lethal. Withdrawals are the physical manifestation of the body getting rid of the excess receptors.  Purpose To eliminate tolerance.  Duration of Holiday 14 consecutive days. (2 weeks)  Expected Symptoms Early symptoms of withdrawal include:  Agitation  Anxiety  Muscle aches  Increased tearing  Insomnia  Runny nose  Sweating  Yawning  Late symptoms of withdrawal include:  Abdominal cramping  Diarrhea  Dilated pupils  Goose bumps  Nausea  Vomiting  Opioid withdrawal reactions are very uncomfortable but are not life-threatening. Symptoms usually start within 12 hours of last opioid dose and within 30 hours of last methadone exposure.  Duration of Symptoms 48 to 72 hours for short acting medications and 2 to 14 days for methadone.  Treatment  Clonidine (Catapres) or tizanidine (Zanaflex) for agitation, sweating, tearing, runny nose.  Promethazine (Phenergan) for nausea, vomiting.  NSAIDs for pain.  Benefits  Improved effectiveness of opioids.  Decreased opioid dose needed to achieve benefits.  Improved pain with lesser dose. ____________________________________________________________________________________________  ____________________________________________________________________________________________  Pain Scale  Introduction: The pain score used by this practice is the Verbal Numerical Rating Scale (VNRS-11). This is an 11-point scale. It is for adults and children 10 years or older. There are significant differences in how the pain score is reported, used, and applied. Forget everything you learned in the past and learn this scoring system.  General Information: The scale should reflect your current level of pain. Unless you are specifically asked for the level of your worst pain, or your average pain. If you are asked for one of these two, then it should be  understood that it is over the past 24 hours.  Basic Activities of Daily  Living (ADL): Personal hygiene, dressing, eating, transferring, and using restroom.  Instructions: Most patients tend to report their level of pain as a combination of two factors, their physical pain and their psychosocial pain. This last one is also known as suffering and it is reflection of how physical pain affects you socially and psychologically. From now on, report them separately. From this point on, when asked to report your pain level, report only your physical pain. Use the following table for reference.  Pain Clinic Pain Levels (0-5/10)  Pain Level Score  Description  No Pain 0   Mild pain 1 Nagging, annoying, but does not interfere with basic activities of daily living (ADL). Patients are able to eat, bathe, get dressed, toileting (being able to get on and off the toilet and perform personal hygiene functions), transfer (move in and out of bed or a chair without assistance), and maintain continence (able to control bladder and bowel functions). Blood pressure and heart rate are unaffected. A normal heart rate for a healthy adult ranges from 60 to 100 bpm (beats per minute).   Mild to moderate pain 2 Noticeable and distracting. Impossible to hide from other people. More frequent flare-ups. Still possible to adapt and function close to normal. It can be very annoying and may have occasional stronger flare-ups. With discipline, patients may get used to it and adapt.   Moderate pain 3 Interferes significantly with activities of daily living (ADL). It becomes difficult to feed, bathe, get dressed, get on and off the toilet or to perform personal hygiene functions. Difficult to get in and out of bed or a chair without assistance. Very distracting. With effort, it can be ignored when deeply involved in activities.   Moderately severe pain 4 Impossible to ignore for more than a few minutes. With effort, patients may still  be able to manage work or participate in some social activities. Very difficult to concentrate. Signs of autonomic nervous system discharge are evident: dilated pupils (mydriasis); mild sweating (diaphoresis); sleep interference. Heart rate becomes elevated (>115 bpm). Diastolic blood pressure (lower number) rises above 100 mmHg. Patients find relief in laying down and not moving.   Severe pain 5 Intense and extremely unpleasant. Associated with frowning face and frequent crying. Pain overwhelms the senses.  Ability to do any activity or maintain social relationships becomes significantly limited. Conversation becomes difficult. Pacing back and forth is common, as getting into a comfortable position is nearly impossible. Pain wakes you up from deep sleep. Physical signs will be obvious: pupillary dilation; increased sweating; goosebumps; brisk reflexes; cold, clammy hands and feet; nausea, vomiting or dry heaves; loss of appetite; significant sleep disturbance with inability to fall asleep or to remain asleep. When persistent, significant weight loss is observed due to the complete loss of appetite and sleep deprivation.  Blood pressure and heart rate becomes significantly elevated. Caution: If elevated blood pressure triggers a pounding headache associated with blurred vision, then the patient should immediately seek attention at an urgent or emergency care unit, as these may be signs of an impending stroke.    Emergency Department Pain Levels (6-10/10)  Emergency Room Pain 6 Severely limiting. Requires emergency care and should not be seen or managed at an outpatient pain management facility. Communication becomes difficult and requires great effort. Assistance to reach the emergency department may be required. Facial flushing and profuse sweating along with potentially dangerous increases in heart rate and blood pressure will be evident.   Distressing pain 7 Self-care is  very difficult. Assistance is  required to transport, or use restroom. Assistance to reach the emergency department will be required. Tasks requiring coordination, such as bathing and getting dressed become very difficult.   Disabling pain 8 Self-care is no longer possible. At this level, pain is disabling. The individual is unable to do even the most basic activities such as walking, eating, bathing, dressing, transferring to a bed, or toileting. Fine motor skills are lost. It is difficult to think clearly.   Incapacitating pain 9 Pain becomes incapacitating. Thought processing is no longer possible. Difficult to remember your own name. Control of movement and coordination are lost.   The worst pain imaginable 10 At this level, most patients pass out from pain. When this level is reached, collapse of the autonomic nervous system occurs, leading to a sudden drop in blood pressure and heart rate. This in turn results in a temporary and dramatic drop in blood flow to the brain, leading to a loss of consciousness. Fainting is one of the bodys self defense mechanisms. Passing out puts the brain in a calmed state and causes it to shut down for a while, in order to begin the healing process.    Summary: 1. Refer to this scale when providing [place] with your pain level. 2. Be accurate and careful when reporting your pain level. This will help with your care. 3. Over-reporting your pain level will lead to loss of credibility. 4. Even a level of 1/10 means that there is pain and will be treated at our facility. 5. High, inaccurate reporting will be documented as Symptom Exaggeration, leading to loss of credibility and suspicions of possible secondary gains such as obtaining more narcotics, or wanting to appear disabled, for fraudulent reasons. 6. Only pain levels of 5 or below will be seen at our facility. 7. Pain levels of 6 and above will be sent to the Emergency Department and the appointment  cancelled. ____________________________________________________________________________________________

## 2017-02-05 NOTE — Progress Notes (Signed)
Nursing Pain Medication Assessment:  Safety precautions to be maintained throughout the outpatient stay will include: orient to surroundings, keep bed in low position, maintain call bell within reach at all times, provide assistance with transfer out of bed and ambulation.  Medication Inspection Compliance: Pill count conducted under aseptic conditions, in front of the patient. Neither the pills nor the bottle was removed from the patient's sight at any time. Once count was completed pills were immediately returned to the patient in their original bottle.  Medication: See above Pill/Patch Count: 22 of 60 pills remain Pill/Patch Appearance: Markings consistent with prescribed medication Bottle Appearance: Standard pharmacy container. Clearly labeled. Filled Date: 05 / 16 / 2018 Last Medication intake:  Today

## 2017-02-08 LAB — TOXASSURE SELECT 13 (MW), URINE

## 2017-05-07 ENCOUNTER — Encounter: Payer: Self-pay | Admitting: Nurse Practitioner

## 2017-05-07 ENCOUNTER — Ambulatory Visit: Payer: Medicare Other | Attending: Nurse Practitioner | Admitting: Nurse Practitioner

## 2017-05-07 VITALS — BP 136/82 | HR 75 | Temp 98.1°F | Resp 16 | Ht 63.0 in | Wt 170.0 lb

## 2017-05-07 DIAGNOSIS — Z87891 Personal history of nicotine dependence: Secondary | ICD-10-CM | POA: Insufficient documentation

## 2017-05-07 DIAGNOSIS — M79604 Pain in right leg: Secondary | ICD-10-CM | POA: Insufficient documentation

## 2017-05-07 DIAGNOSIS — G8929 Other chronic pain: Secondary | ICD-10-CM

## 2017-05-07 DIAGNOSIS — M545 Low back pain: Secondary | ICD-10-CM | POA: Insufficient documentation

## 2017-05-07 DIAGNOSIS — Z79899 Other long term (current) drug therapy: Secondary | ICD-10-CM | POA: Diagnosis not present

## 2017-05-07 DIAGNOSIS — Z5181 Encounter for therapeutic drug level monitoring: Secondary | ICD-10-CM | POA: Insufficient documentation

## 2017-05-07 DIAGNOSIS — M47896 Other spondylosis, lumbar region: Secondary | ICD-10-CM | POA: Diagnosis not present

## 2017-05-07 DIAGNOSIS — I429 Cardiomyopathy, unspecified: Secondary | ICD-10-CM | POA: Insufficient documentation

## 2017-05-07 DIAGNOSIS — Z79891 Long term (current) use of opiate analgesic: Secondary | ICD-10-CM | POA: Diagnosis not present

## 2017-05-07 DIAGNOSIS — M47816 Spondylosis without myelopathy or radiculopathy, lumbar region: Secondary | ICD-10-CM | POA: Diagnosis not present

## 2017-05-07 DIAGNOSIS — E559 Vitamin D deficiency, unspecified: Secondary | ICD-10-CM | POA: Diagnosis not present

## 2017-05-07 DIAGNOSIS — I10 Essential (primary) hypertension: Secondary | ICD-10-CM | POA: Insufficient documentation

## 2017-05-07 DIAGNOSIS — L7682 Other postprocedural complications of skin and subcutaneous tissue: Secondary | ICD-10-CM | POA: Diagnosis not present

## 2017-05-07 DIAGNOSIS — R0789 Other chest pain: Secondary | ICD-10-CM | POA: Diagnosis not present

## 2017-05-07 DIAGNOSIS — M79605 Pain in left leg: Secondary | ICD-10-CM | POA: Insufficient documentation

## 2017-05-07 DIAGNOSIS — E785 Hyperlipidemia, unspecified: Secondary | ICD-10-CM | POA: Insufficient documentation

## 2017-05-07 DIAGNOSIS — Z951 Presence of aortocoronary bypass graft: Secondary | ICD-10-CM | POA: Diagnosis not present

## 2017-05-07 DIAGNOSIS — I25110 Atherosclerotic heart disease of native coronary artery with unstable angina pectoris: Secondary | ICD-10-CM | POA: Insufficient documentation

## 2017-05-07 DIAGNOSIS — M109 Gout, unspecified: Secondary | ICD-10-CM | POA: Diagnosis not present

## 2017-05-07 DIAGNOSIS — G4733 Obstructive sleep apnea (adult) (pediatric): Secondary | ICD-10-CM | POA: Diagnosis not present

## 2017-05-07 DIAGNOSIS — G894 Chronic pain syndrome: Secondary | ICD-10-CM

## 2017-05-07 DIAGNOSIS — I252 Old myocardial infarction: Secondary | ICD-10-CM | POA: Insufficient documentation

## 2017-05-07 DIAGNOSIS — G629 Polyneuropathy, unspecified: Secondary | ICD-10-CM | POA: Diagnosis not present

## 2017-05-07 DIAGNOSIS — Z7982 Long term (current) use of aspirin: Secondary | ICD-10-CM | POA: Diagnosis not present

## 2017-05-07 DIAGNOSIS — J449 Chronic obstructive pulmonary disease, unspecified: Secondary | ICD-10-CM | POA: Diagnosis not present

## 2017-05-07 DIAGNOSIS — M792 Neuralgia and neuritis, unspecified: Secondary | ICD-10-CM | POA: Diagnosis not present

## 2017-05-07 DIAGNOSIS — M25511 Pain in right shoulder: Secondary | ICD-10-CM | POA: Insufficient documentation

## 2017-05-07 DIAGNOSIS — M4696 Unspecified inflammatory spondylopathy, lumbar region: Secondary | ICD-10-CM | POA: Diagnosis not present

## 2017-05-07 MED ORDER — HYDROCODONE-ACETAMINOPHEN 5-325 MG PO TABS: 1.0000 | ORAL_TABLET | Freq: Two times a day (BID) | ORAL | 0 refills | Status: DC | PRN

## 2017-05-07 MED ORDER — GABAPENTIN 600 MG PO TABS: 600.0000 mg | ORAL_TABLET | Freq: Two times a day (BID) | ORAL | 0 refills | Status: DC

## 2017-05-07 NOTE — Patient Instructions (Addendum)
____________________________________________________________________________________________  Medication Rules  Applies to: All patients receiving prescriptions (written or electronic).  Pharmacy of record: Pharmacy where electronic prescriptions will be sent. If written prescriptions are taken to a different pharmacy, please inform the nursing staff. The pharmacy listed in the electronic medical record should be the one where you would like electronic prescriptions to be sent.  Prescription refills: Only during scheduled appointments. Applies to both, written and electronic prescriptions.  NOTE: The following applies primarily to controlled substances (Opioid* Pain Medications).   Patient's responsibilities: 1. Pain Pills: Bring all pain pills to every appointment (except for procedure appointments). 2. Pill Bottles: Bring pills in original pharmacy bottle. Always bring newest bottle. Bring bottle, even if empty. 3. Medication refills: You are responsible for knowing and keeping track of what medications you need refilled. The day before your appointment, write a list of all prescriptions that need to be refilled. Bring that list to your appointment and give it to the admitting nurse. Prescriptions will be written only during appointments. If you forget a medication, it will not be "Called in", "Faxed", or "electronically sent". You will need to get another appointment to get these prescribed. 4. Prescription Accuracy: You are responsible for carefully inspecting your prescriptions before leaving our office. Have the discharge nurse carefully go over each prescription with you, before taking them home. Make sure that your name is accurately spelled, that your address is correct. Check the name and dose of your medication to make sure it is accurate. Check the number of pills, and the written instructions to make sure they are clear and accurate. Make sure that you are given enough medication to  last until your next medication refill appointment. 5. Taking Medication: Take medication as prescribed. Never take more pills than instructed. Never take medication more frequently than prescribed. Taking less pills or less frequently is permitted and encouraged, when it comes to controlled substances (written prescriptions).  6. Inform other Doctors: Always inform, all of your healthcare providers, of all the medications you take. 7. Pain Medication from other Providers: You are not allowed to accept any additional pain medication from any other Doctor or Healthcare provider. There are two exceptions to this rule. (see below) In the event that you require additional pain medication, you are responsible for notifying us, as stated below. 8. Medication Agreement: You are responsible for carefully reading and following our Medication Agreement. This must be signed before receiving any prescriptions from our practice. Safely store a copy of your signed Agreement. Violations to the Agreement will result in no further prescriptions. (Additional copies of our Medication Agreement are available upon request.) 9. Laws, Rules, & Regulations: All patients are expected to follow all Federal and State Laws, Statutes, Rules, & Regulations. Ignorance of the Laws does not constitute a valid excuse. The use of any illegal substances is prohibited. 10. Adopted CDC guidelines & recommendations: Target dosing levels will be at or below 60 MME/day. Use of benzodiazepines** is not recommended.  Exceptions: There are only two exceptions to the rule of not receiving pain medications from other Healthcare Providers. 1. Exception #1 (Emergencies): In the event of an emergency (i.e.: accident requiring emergency care), you are allowed to receive additional pain medication. However, you are responsible for: As soon as you are able, call our office [phone], at any time of the day or night, and leave a message stating your  name, the date and nature of the emergency, and the name and dose of the medication   prescribed. In the event that your call is answered by a member of our staff, make sure to document and save the date, time, and the name of the person that took your information.  2. Exception #2 (Planned Surgery): In the event that you are scheduled by another doctor or dentist to have any type of surgery or procedure, you are allowed (for a period no longer than 30 days), to receive additional pain medication, for the acute post-op pain. However, in this case, you are responsible for picking up a copy of our "Post-op Pain Management for Surgeons" handout, and giving it to your surgeon or dentist. This document is available at our office, and does not require an appointment to obtain it. Simply go to our office during business hours (Monday-Thursday from 8:00 AM to 4:00 PM) (Friday 8:00 AM to 12:00 Noon) or if you have a scheduled appointment with [place], prior to your surgery, and ask for it by name. In addition, you will need to provide [place] with your name, name of your surgeon, type of surgery, and date of procedure or surgery.  *Opioid medications include: morphine, codeine, oxycodone, oxymorphone, hydrocodone, hydromorphone, meperidine, tramadol, tapentadol, buprenorphine, fentanyl, methadone. **Benzodiazepine medications include: diazepam (Valium), alprazolam (Xanax), clonazepam (Klonopine), lorazepam (Ativan), clorazepate (Tranxene), chlordiazepoxide (Librium), estazolam (Prosom), oxazepam (Serax), temazepam (Restoril), triazolam (Halcion)  ____________________________________________________________________________________________  BMI Assessment: Estimated body mass index is 30.11 kg/m as calculated from the following:   Height as of this encounter: 5\' 3"  (1.6 m).   Weight as of this encounter: 170 lb (77.1 kg).  BMI interpretation table: BMI level Category Range association with higher incidence of chronic pain   <18 kg/m2 Underweight   18.5-24.9 kg/m2 Ideal body weight   25-29.9 kg/m2 Overweight Increased incidence by 20%  30-34.9 kg/m2 Obese (Class I) Increased incidence by 68%  35-39.9 kg/m2 Severe obesity (Class II) Increased incidence by 136%  >40 kg/m2 Extreme obesity (Class III) Increased incidence by 254%   BMI Readings from Last 4 Encounters:  05/07/17 30.11 kg/m  02/05/17 30.11 kg/m  01/08/17 28.09 kg/m  12/04/16 30.11 kg/m   Wt Readings from Last 4 Encounters:  05/07/17 170 lb (77.1 kg)  02/05/17 170 lb (77.1 kg)  01/08/17 168 lb 12.8 oz (76.6 kg)  12/04/16 170 lb (77.1 kg)  Pain Management Discharge Instructions  General Discharge Instructions :  If you need to reach your doctor call: Monday-Friday 8:00 am - 4:00 pm at [phone] or toll free [phone].  After clinic hours [phone] to have operator reach doctor.  Bring all of your medication bottles to all your appointments in the pain clinic.  To cancel or reschedule your appointment with Pain Management please remember to call 24 hours in advance to avoid a fee.  Refer to the educational materials which you have been given on: General Risks, I had my Procedure. Discharge Instructions, Post Sedation.  Post Procedure Instructions:  The drugs you were given will stay in your system until tomorrow, so for the next 24 hours you should not drive, make any legal decisions or drink any alcoholic beverages.  You may eat anything you prefer, but it is better to start with liquids then soups and crackers, and gradually work up to solid foods.  Please notify your doctor immediately if you have any unusual bleeding, trouble breathing or pain that is not related to your normal pain.  Depending on the type of procedure that was done, some parts of your body may feel week and/or numb.  This usually clears up by tonight or the next day.  Walk with the use of an assistive device or accompanied by an adult for the 24  hours.  You may use ice on the affected area for the first 24 hours.  Put ice in a Ziploc bag and cover with a towel and place against area 15 minutes on 15 minutes off.  You may switch to heat after 24 hours.

## 2017-05-07 NOTE — Progress Notes (Signed)
Nursing Pain Medication Assessment:  Safety precautions to be maintained throughout the outpatient stay will include: orient to surroundings, keep bed in low position, maintain call bell within reach at all times, provide assistance with transfer out of bed and ambulation.  Medication Inspection Compliance: Pill count conducted under aseptic conditions, in front of the patient. Neither the pills nor the bottle was removed from the patient's sight at any time. Once count was completed pills were immediately returned to the patient in their original bottle.  Medication: Hydrocodone/APAP Pill/Patch Count: 20 of 60 pills remain Pill/Patch Appearance: Markings consistent with prescribed medication Bottle Appearance: Standard pharmacy container. Clearly labeled. Filled Date: 08 / 14 / 2018 Last Medication intake:  Yesterday

## 2017-05-07 NOTE — Progress Notes (Signed)
Patient's Name: [name] [name].  MRN: [id number]  Referring Provider: Elisabeth Cara, NP  DOB: 03/28/1956  PCP: Elisabeth Cara, NP  DOS: 05/07/2017  Note by: Vevelyn Francois NP  Service setting: Ambulatory outpatient  Specialty: Interventional Pain Management  Location: [facility] (AMB) Pain Management Facility    Patient type: Established    Primary Reason(s) for Visit: Encounter for prescription drug management. (Level of risk: moderate)  CC: Chest Pain (upper sternum); Leg Pain (both); Back Pain (lower); and Shoulder Pain (right)  HPI  [name] [name] is a 61 y.o. year old, male patient, who comes today for a medication management evaluation. He has Apnea, sleep; H/O coronary artery bypass surgery; OSA (obstructive sleep apnea); Hypertension; HLD (hyperlipidemia); Coronary artery disease; Chronic obstructive pulmonary disease ([place]); Alcohol withdrawal syndrome ([place]); Injury of kidney; Chronic low back pain (Bilateral) (R>L); Chronic shoulder pain (Location of Tertiary source of pain) (Right); Chronic hip pain (Left); History of alcoholism ([place]); Opiate use; Encounter for therapeutic drug level monitoring; Chronic chest wall pain (Location of Primary Source of Pain) (Incisional Midline) (since 04/22/2012); Chronic lower extremity pain (Location of Secondary source of pain) (Bilateral) (R>L); History of MI (myocardial infarction) (January 2014); Encounter for pain management planning; Chronic sacroiliac joint pain (Bilateral) (L>R); Neuropathic pain; Neurogenic pain; Incisional pain; Elevated sedimentation rate; Vitamin D insufficiency; Lumbar facet syndrome (Bilateral) (R>L); Lumbar spondylosis; Long term current use of opiate analgesic; Long term prescription opiate use; Keloid skin disorder; Costochondritis; Unstable angina ([place]); Chest pain; Brachial plexus injury, right; Carotid atherosclerosis; Hypoalbuminemia; Hypokalemia; Right sided weakness; S/P CABG (coronary artery bypass graft); Opioid-induced  constipation ([place]); Pneumonia; Pressure injury of skin; COPD (chronic obstructive pulmonary disease) ([place]); and Chronic pain syndrome on his problem list. His primarily concern today is the Chest Pain (upper sternum); Leg Pain (both); Back Pain (lower); and Shoulder Pain (right)  Pain Assessment: Location: Upper Chest Radiating: upper chest Onset: More than a month ago Duration: Chronic pain Quality: Aching, Constant, Radiating Severity: 4 /10 (self-reported pain score)  Note: Reported level is compatible with observation.                   Effect on ADL: pace self Timing: Constant Modifying factors: medicine, rest  [name] [name] was last scheduled for an appointment on 02/05/2017 for medication management. During today's appointment we reviewed [name] [name] chronic pain status, as well as his outpatient medication regimen. He denies any radicular symptoms.   The patient  reports that he does not use drugs. His body mass index is 30.11 kg/m.  Further details on both, my assessment(s), as well as the proposed treatment plan, please see below.  Controlled Substance Pharmacotherapy Assessment REMS (Risk Evaluation and Mitigation Strategy)  Analgesic:Hydrocodone/APAP 5/325 one tablet twice a day (10 mg per day) MME/day:10 mg/day.  Florene Glen, Ladon Applebaum, RN  05/07/2017 11:04 AM  Sign at close encounter Nursing Pain Medication Assessment:  Safety precautions to be maintained throughout the outpatient stay will include: orient to surroundings, keep bed in low position, maintain call bell within reach at all times, provide assistance with transfer out of bed and ambulation.  Medication Inspection Compliance: Pill count conducted under aseptic conditions, in front of the patient. Neither the pills nor the bottle was removed from the patient's sight at any time. Once count was completed pills were immediately returned to the patient in their original bottle.  Medication: Hydrocodone/APAP Pill/Patch  Count: 20 of 60 pills remain Pill/Patch Appearance: Markings consistent with prescribed medication Bottle Appearance:  Standard pharmacy container. Clearly labeled. Filled Date: 08 / 14 / 2018 Last Medication intake:  Yesterday   Pharmacokinetics: Liberation and absorption (onset of action): WNL Distribution (time to peak effect): WNL Metabolism and excretion (duration of action): WNL         Pharmacodynamics: Desired effects: Analgesia: [name] [name] reports >50% benefit. Functional ability: Patient reports that medication allows him to accomplish basic ADLs Clinically meaningful improvement in function (CMIF): Sustained CMIF goals met Perceived effectiveness: Described as relatively effective, allowing for increase in activities of daily living (ADL) Undesirable effects: Side-effects or Adverse reactions: None reported Monitoring: [place] PMP: Online review of the past 67-monthperiod conducted. Compliant with practice rules and regulations List of all UDS test(s) done:  Lab Results  Component Value Date   TOXASSSELUR FINAL 01/16/2016   TOXASSSELUR FINAL 12/23/2015   TOXASSSELUR FINAL 10/25/2015   SUMMARY FINAL 02/05/2017   Last UDS on record: [employer] Select 13  Date Value Ref Range Status  01/16/2016 FINAL  Final    Comment:    ==================================================================== TOXASSURE SELECT 13 (MW) ==================================================================== Test                             Result       Flag       Units Drug Present and Declared for Prescription Verification   Hydrocodone                    700          EXPECTED   ng/mg creat   Hydromorphone                  168          EXPECTED   ng/mg creat   Dihydrocodeine                 43           EXPECTED   ng/mg creat   Norhydrocodone                 271          EXPECTED   ng/mg creat    Sources of hydrocodone include scheduled prescription    medications. Hydromorphone, dihydrocodeine  and norhydrocodone are    expected metabolites of hydrocodone. Hydromorphone and    dihydrocodeine are also available as scheduled prescription    medications. ==================================================================== Test                      Result    Flag   Units      Ref Range   Creatinine              120              mg/dL      >=20 ==================================================================== Declared Medications:  The flagging and interpretation on this report are based on the  following declared medications.  Unexpected results may arise from  inaccuracies in the declared medications.  **Note: The testing scope of this panel includes these medications:  Hydrocodone (Hydrocodone-Acetaminophen)  **Note: The testing scope of this panel does not include following  reported medications:  Acetaminophen (Hydrocodone-Acetaminophen)  Allopurinol  Amlodipine  Aspirin  Atorvastatin  Cholecalciferol  Gabapentin  Hydrochlorothiazide  Losartan (Losartan Potassium)  Meloxicam  Metoprolol  Multivitamin  Vitamin D2 (Ergocalciferol) ==================================================================== For clinical consultation, please call [phone] ====================================================================    Summary  Date  Value Ref Range Status  02/05/2017 FINAL  Final    Comment:    ==================================================================== TOXASSURE SELECT 13 (MW) ==================================================================== Test                             Result       Flag       Units Drug Present and Declared for Prescription Verification   Hydrocodone                    333          EXPECTED   ng/mg creat   Hydromorphone                  155          EXPECTED   ng/mg creat   Dihydrocodeine                 32           EXPECTED   ng/mg creat   Norhydrocodone                 299          EXPECTED   ng/mg creat    Sources of  hydrocodone include scheduled prescription    medications. Hydromorphone, dihydrocodeine and norhydrocodone are    expected metabolites of hydrocodone. Hydromorphone and    dihydrocodeine are also available as scheduled prescription    medications. ==================================================================== Test                      Result    Flag   Units      Ref Range   Creatinine              199              mg/dL      >=20 ==================================================================== Declared Medications:  The flagging and interpretation on this report are based on the  following declared medications.  Unexpected results may arise from  inaccuracies in the declared medications.  **Note: The testing scope of this panel includes these medications:  Hydrocodone (Hydrocodone-Acetaminophen)  **Note: The testing scope of this panel does not include following  reported medications:  Acetaminophen (Hydrocodone-Acetaminophen)  Allopurinol  Amlodipine  Aspirin (Aspirin 81)  Atorvastatin  Gabapentin (Neurontin)  Hydrochlorothiazide (Hydrodiuril)  Isosorbide (Imdur)  Losartan (Cozaar)  Metoprolol  Multivitamin  Nitroglycerin  Potassium  Vitamin D3 ==================================================================== For clinical consultation, please call [phone]. ====================================================================    UDS interpretation: Compliant          Medication Assessment Form: Reviewed. Patient indicates being compliant with therapy Treatment compliance: Compliant Risk Assessment Profile: Aberrant behavior: See prior evaluations. None observed or detected today Comorbid factors increasing risk of overdose: See prior notes. No additional risks detected today Risk of substance use disorder (SUD): Low     Opioid Risk Tool - 05/07/17 1101      Family History of Substance Abuse   Alcohol Negative   Illegal Drugs Negative   Rx Drugs  Negative     Personal History of Substance Abuse   Alcohol Negative   Illegal Drugs Negative   Rx Drugs Negative     Age   Age between 22-45 years  No     History of Preadolescent Sexual Abuse   History of Preadolescent Sexual Abuse Negative or Male     Psychological Disease   Psychological Disease Negative  Depression Negative     Total Score   Opioid Risk Tool Scoring 0   Opioid Risk Interpretation Low Risk     ORT Scoring interpretation table:  Score <3 = Low Risk for SUD  Score between 4-7 = Moderate Risk for SUD  Score >8 = High Risk for Opioid Abuse   Risk Mitigation Strategies:  Patient Counseling: Covered Patient-Prescriber Agreement (PPA): Present and active  Notification to other healthcare providers: Done  Pharmacologic Plan: No change in therapy, at this time  Laboratory Chemistry  Inflammation Markers (CRP: Acute Phase) (ESR: Chronic Phase) Lab Results  Component Value Date   CRP <0.5 10/26/2015   ESRSEDRATE 32 (H) 10/26/2015                 Renal Function Markers Lab Results  Component Value Date   BUN 15 12/05/2016   CREATININE 1.00 12/05/2016   GFRAA >60 12/05/2016   GFRNONAA >60 12/05/2016                 Hepatic Function Markers Lab Results  Component Value Date   AST 63 (H) 12/04/2016   ALT 55 12/04/2016   ALBUMIN 4.7 12/04/2016   ALKPHOS 62 12/04/2016                 Electrolytes Lab Results  Component Value Date   NA 137 12/05/2016   K 3.5 12/05/2016   CL 104 12/05/2016   CALCIUM 8.7 (L) 12/05/2016   MG 2.0 10/26/2015                 Neuropathy Markers No results found for: [id number]               Bone Pathology Markers Lab Results  Component Value Date   ALKPHOS 62 12/04/2016   VD25OH 26.5 (L) 10/26/2015   VD125OH2TOT 30.8 10/26/2015   CALCIUM 8.7 (L) 12/05/2016                 Coagulation Parameters Lab Results  Component Value Date   INR 0.97 02/09/2015   LABPROT 13.1 02/09/2015   APTT > 160.0 (HH)  04/21/2012   PLT 175 12/05/2016                 Cardiovascular Markers Lab Results  Component Value Date   BNP 255 (H) 08/01/2014   HGB 14.3 12/06/2016   HCT 37.0 (L) 12/05/2016                 Note: Lab results reviewed.  Recent Diagnostic Imaging Review  Ct Abdomen Pelvis W Contrast  Result Date: 12/04/2016 CLINICAL DATA:  Left lower quadrant pain with intermittent constipation and diarrhea for the past week. Today having bright red blood in stool. EXAM: CT ABDOMEN AND PELVIS WITH CONTRAST TECHNIQUE: Multidetector CT imaging of the abdomen and pelvis was performed using the standard protocol following bolus administration of intravenous contrast. CONTRAST:  128m ISOVUE-300 IOPAMIDOL (ISOVUE-300) INJECTION 61% COMPARISON:  03/20/2015 CT FINDINGS: Lower chest: Normal size cardiac chambers. Minimal scarring at the left lung base and right middle lobe. No effusion or pulmonary consolidation. Hepatobiliary: Hepatic steatosis.  Normal gallbladder. Pancreas: Unremarkable Spleen: Unremarkable Adrenals/Urinary Tract: Adrenal glands are unremarkable. Kidneys are normal, without renal calculi, focal lesion, or hydronephrosis. Bladder is unremarkable. Stomach/Bowel: Diffuse transmural thickening of descending colon through rectum consistent with proctocolitis. No bowel obstruction, perforation or abscess. Duodenal diverticulum however is of incidental note off the second portion, otherwise the small bowel is unremarkable. Normal appendix.  Vascular/Lymphatic: Aortic and branch vessel atherosclerosis without aneurysm or dissection. No enlarged lymph nodes by CT criteria. Reproductive: Scattered peripheral calcifications of along the corpora cavernosa in the region of the tunica which can be seen with Peyronie disease. Other: No abdominal wall hernia or abnormality. No abdominopelvic ascites. Fat noted in the left inguinal canal. Musculoskeletal: Degenerative disc disease L4-5 and L5-S1 with slight  retrolisthesis grade 1 of L5 on S1. IMPRESSION: 1. Diffuse transmural thickening from descending colon through rectum consistent with proctocolitis. 2. Hepatic steatosis. 3. Lower lumbar degenerative disc disease L4-5 and L5-S1. 4. Calcifications along the tunica of penis which can be seen with Peyronie disease. Electronically Signed   By: Ashley Royalty M.D.   On: 12/04/2016 19:01   Note: Imaging results reviewed.          Meds   Current Outpatient Prescriptions:  .  allopurinol (ZYLOPRIM) 300 MG tablet, Take 300 mg by mouth daily. *patient takes in the afternoon*, Disp: , Rfl:  .  amLODipine (NORVASC) 10 MG tablet, Take 10 mg by mouth daily. , Disp: , Rfl:  .  atorvastatin (LIPITOR) 40 MG tablet, Take 40 mg by mouth at bedtime. , Disp: , Rfl:  .  Cholecalciferol (VITAMIN D3) 2000 units TABS, Take 2,000 Units by mouth every morning., Disp: , Rfl:  .  hydrochlorothiazide (HYDRODIURIL) 25 MG tablet, Take 25 mg by mouth daily. , Disp: , Rfl:  .  [START ON 05/16/2017] HYDROcodone-acetaminophen (NORCO/VICODIN) 5-325 MG tablet, Take 1 tablet by mouth 2 (two) times daily as needed for severe pain., Disp: 60 tablet, Rfl: 0 .  isosorbide mononitrate (IMDUR) 30 MG 24 hr tablet, Take 30 mg by mouth every morning. , Disp: , Rfl: 1 .  losartan (COZAAR) 100 MG tablet, take 1 tablet by mouth once daily, Disp: , Rfl:  .  metoprolol (LOPRESSOR) 50 MG tablet, Take 50 mg by mouth 3 (three) times daily., Disp: , Rfl:  .  Multiple Vitamin (MULTIVITAMIN WITH MINERALS) TABS tablet, Take 1 tablet by mouth every evening., Disp: , Rfl:  .  nitroGLYCERIN (NITROSTAT) 0.4 MG SL tablet, Place 0.4 mg under the tongue every 5 (five) minutes x 3 doses as needed for chest pain. , Disp: , Rfl:  .  Potassium 95 MG TABS, Take 95 mg by mouth every morning. , Disp: , Rfl:  .  aspirin EC 81 MG tablet, Take 81 mg by mouth every morning., Disp: , Rfl:  .  [START ON 05/16/2017] gabapentin (NEURONTIN) 600 MG tablet, Take 1 tablet (600 mg  total) by mouth 2 (two) times daily., Disp: 180 tablet, Rfl: 0 .  hydrochlorothiazide (HYDRODIURIL) 25 MG tablet, Take by mouth., Disp: , Rfl:  .  [START ON 06/15/2017] HYDROcodone-acetaminophen (NORCO/VICODIN) 5-325 MG tablet, Take 1 tablet by mouth 2 (two) times daily as needed for severe pain., Disp: 60 tablet, Rfl: 0 .  [START ON 07/15/2017] HYDROcodone-acetaminophen (NORCO/VICODIN) 5-325 MG tablet, Take 1 tablet by mouth 2 (two) times daily as needed for severe pain., Disp: 60 tablet, Rfl: 0 .  isosorbide mononitrate (IMDUR) 30 MG 24 hr tablet, Take by mouth., Disp: , Rfl:  .  losartan (COZAAR) 100 MG tablet, Take by mouth., Disp: , Rfl:   ROS  Constitutional: Denies any fever or chills Gastrointestinal: No reported hemesis, hematochezia, vomiting, or acute GI distress Musculoskeletal: Denies any acute onset joint swelling, redness, loss of ROM, or weakness Neurological: No reported episodes of acute onset apraxia, aphasia, dysarthria, agnosia, amnesia, paralysis, loss of coordination, or  loss of consciousness  Allergies  [name] [name] has No Known Allergies.  PFSH  Drug: [name] [name]  reports that he does not use drugs. Alcohol:  reports that he drinks alcohol. Tobacco:  reports that he has quit smoking. He quit after 25.00 years of use. He has quit using smokeless tobacco. Medical:  has a past medical history of Benign esophageal stricture; Cardiomyopathy, secondary ([place]); Coronary artery disease; Diverticulitis; GI bleed; Gout; Hemorrhoids; Hyperlipidemia; Hypertension; Sleep apnea; and Tubular adenoma of colon. Surgical: [name] [name]  has a past surgical history that includes Coronary angioplasty with stent; cardiac bypass; Cardiac surgery; Coronary artery bypass graft; Colonoscopy with propofol (N/A, 04/29/2015); Esophagogastroduodenoscopy (egd) with propofol (N/A, 04/29/2015); Septoplasty; and Cardiac catheterization (Left, 02/03/2016). Family: family history includes Heart disease in his  mother; Hypertension in his father.  Constitutional Exam  General appearance: Well nourished, well developed, and well hydrated. In no apparent acute distress Vitals:   05/07/17 1053  BP: 136/82  Pulse: 75  Resp: 16  Temp: 98.1 F (36.7 C)  SpO2: 96%  Weight: 170 lb (77.1 kg)  Height: _0  (1.6 m)  Psych/Mental status: Alert, oriented x 3 (person, place, & time)       Eyes: PERLA Respiratory: No evidence of acute respiratory distress  Cervical Spine Area Exam  Skin & Axial Inspection: No masses, redness, edema, swelling, or associated skin lesions Alignment: Symmetrical Functional ROM: Unrestricted ROM      Stability: No instability detected Muscle Tone/Strength: Functionally intact. No obvious neuro-muscular anomalies detected. Sensory (Neurological): Unimpaired Palpation: No palpable anomalies              Upper Extremity (UE) Exam    Side: Right upper extremity  Side: Left upper extremity  Skin & Extremity Inspection: Skin color, temperature, and hair growth are WNL. No peripheral edema or cyanosis. No masses, redness, swelling, asymmetry, or associated skin lesions. No contractures.  Skin & Extremity Inspection: Skin color, temperature, and hair growth are WNL. No peripheral edema or cyanosis. No masses, redness, swelling, asymmetry, or associated skin lesions. No contractures.  Functional ROM: Unrestricted ROM          Functional ROM: Unrestricted ROM          Muscle Tone/Strength: Functionally intact. No obvious neuro-muscular anomalies detected.  Muscle Tone/Strength: Functionally intact. No obvious neuro-muscular anomalies detected.  Sensory (Neurological): Unimpaired          Sensory (Neurological): Unimpaired          Palpation: No palpable anomalies              Palpation: No palpable anomalies              Specialized Test(s): Deferred         Specialized Test(s): Deferred          Thoracic Spine Area Exam  Skin & Axial Inspection: No masses, redness, or  swelling Alignment: Symmetrical Functional ROM: Unrestricted ROM Stability: No instability detected Muscle Tone/Strength: Functionally intact. No obvious neuro-muscular anomalies detected. Sensory (Neurological): Unimpaired Muscle strength & Tone: No palpable anomalies  Lumbar Spine Area Exam  Skin & Axial Inspection: No masses, redness, or swelling Alignment: Symmetrical Functional ROM: Unrestricted ROM      Stability: No instability detected Muscle Tone/Strength: Functionally intact. No obvious neuro-muscular anomalies detected. Sensory (Neurological): Unimpaired Palpation: No palpable anomalies       Provocative Tests: Lumbar Hyperextension and rotation test: evaluation deferred today       Lumbar Lateral bending test: evaluation  deferred today       Patrick's Maneuver: evaluation deferred today                    Gait & Posture Assessment  Ambulation: Unassisted Gait: Relatively normal for age and body habitus Posture: WNL   Lower Extremity Exam    Side: Right lower extremity  Side: Left lower extremity  Skin & Extremity Inspection: Skin color, temperature, and hair growth are WNL. No peripheral edema or cyanosis. No masses, redness, swelling, asymmetry, or associated skin lesions. No contractures.  Skin & Extremity Inspection: Skin color, temperature, and hair growth are WNL. No peripheral edema or cyanosis. No masses, redness, swelling, asymmetry, or associated skin lesions. No contractures.  Functional ROM: Unrestricted ROM          Functional ROM: Unrestricted ROM          Muscle Tone/Strength: Functionally intact. No obvious neuro-muscular anomalies detected.  Muscle Tone/Strength: Functionally intact. No obvious neuro-muscular anomalies detected.  Sensory (Neurological): Unimpaired  Sensory (Neurological): Unimpaired  Palpation: No palpable anomalies  Palpation: No palpable anomalies   Assessment  Primary Diagnosis & Pertinent Problem List: The primary encounter  diagnosis was Chronic chest wall pain (Location of Primary Source of Pain) (Incisional Midline) (since 04/22/2012). Diagnoses of Lumbar facet syndrome (Bilateral) (R>L), Lumbar spondylosis, Incisional pain, Neurogenic pain, and Chronic pain syndrome were also pertinent to this visit.  Status Diagnosis  Controlled Controlled Controlled 1. Chronic chest wall pain (Location of Primary Source of Pain) (Incisional Midline) (since 04/22/2012)   2. Lumbar facet syndrome (Bilateral) (R>L)   3. Lumbar spondylosis   4. Incisional pain   5. Neurogenic pain   6. Chronic pain syndrome     Problems updated and reviewed during this visit: No problems updated. Plan of Care  Pharmacotherapy (Medications Ordered): Meds ordered this encounter  Medications  . gabapentin (NEURONTIN) 600 MG tablet    Sig: Take 1 tablet (600 mg total) by mouth 2 (two) times daily.    Dispense:  180 tablet    Refill:  0    Do not add this medication to the electronic "Automatic Refill" notification system. Patient may have prescription filled one day early if pharmacy is closed on scheduled refill date.    Order Specific Question:   Supervising Provider    Answer:   Milinda Pointer [phone]  . HYDROcodone-acetaminophen (NORCO/VICODIN) 5-325 MG tablet    Sig: Take 1 tablet by mouth 2 (two) times daily as needed for severe pain.    Dispense:  60 tablet    Refill:  0    Patient may have prescription filled one day early if pharmacy is closed on scheduled refill date. Do not fill until: 05/16/2017 To last until:06/15/2017    Order Specific Question:   Supervising Provider    Answer:   Milinda Pointer [phone]  . HYDROcodone-acetaminophen (NORCO/VICODIN) 5-325 MG tablet    Sig: Take 1 tablet by mouth 2 (two) times daily as needed for severe pain.    Dispense:  60 tablet    Refill:  0    Patient may have prescription filled one day early if pharmacy is closed on scheduled refill date. Do not fill until: 06/15/2017 To  last until:07/15/2017    Order Specific Question:   Supervising Provider    Answer:   Milinda Pointer [phone]  . HYDROcodone-acetaminophen (NORCO/VICODIN) 5-325 MG tablet    Sig: Take 1 tablet by mouth 2 (two) times daily as needed for severe pain.  Dispense:  60 tablet    Refill:  0    Patient may have prescription filled one day early if pharmacy is closed on scheduled refill date. Do not fill until:07/15/2017 To last until:08/14/2017    Order Specific Question:   Supervising Provider    Answer:   Milinda Pointer [[id number]]   New Prescriptions   No medications on file   Medications administered today: [name] [name] had no medications administered during this visit. Lab-work, procedure(s), and/or referral(s):  No orders of the defined types were placed in this encounter.  Imaging and/or referral(s): None  Interventional therapies: Planned, scheduled, and/or pending:   Continue with current regimen     Considering:   Lumbar facet and sacroiliac joint radiofrequency ablation.   Palliative PRN treatment(s):   Diagnostic left intra-articular hip joint injection     Provider-requested follow-up: Return in about 3 months (around 08/06/2017) for MedMgmt.  Future Appointments Date Time Provider [place]  08/06/2017 12:45 PM Vevelyn Francois, NP [facility] None   Primary Care Physician: Elisabeth Cara, NP Location: [facility] Note by: Vevelyn Francois NP Date: 05/07/2017; Time: 3:39 PM  Pain Score Disclaimer: We use the NRS-11 scale. This is a self-reported, subjective measurement of pain severity with only modest accuracy. It is used primarily to identify changes within a particular patient. It must be understood that outpatient pain scales are significantly less accurate that those used for research, where they can be applied under ideal controlled circumstances with minimal exposure to variables. In reality, the score is likely to be  a combination of pain intensity and pain affect, where pain affect describes the degree of emotional arousal or changes in action readiness caused by the sensory experience of pain. Factors such as social and work situation, setting, emotional state, anxiety levels, expectation, and prior pain experience may influence pain perception and show large inter-individual differences that may also be affected by time variables.  Patient instructions provided during this appointment: Patient Instructions    ____________________________________________________________________________________________  Medication Rules  Applies to: All patients receiving prescriptions (written or electronic).  Pharmacy of record: Pharmacy where electronic prescriptions will be sent. If written prescriptions are taken to a different pharmacy, please inform the nursing staff. The pharmacy listed in the electronic medical record should be the one where you would like electronic prescriptions to be sent.  Prescription refills: Only during scheduled appointments. Applies to both, written and electronic prescriptions.  NOTE: The following applies primarily to controlled substances (Opioid* Pain Medications).   Patient's responsibilities: 1. Pain Pills: Bring all pain pills to every appointment (except for procedure appointments). 2. Pill Bottles: Bring pills in original pharmacy bottle. Always bring newest bottle. Bring bottle, even if empty. 3. Medication refills: You are responsible for knowing and keeping track of what medications you need refilled. The day before your appointment, write a list of all prescriptions that need to be refilled. Bring that list to your appointment and give it to the admitting nurse. Prescriptions will be written only during appointments. If you forget a medication, it will not be "Called in", "Faxed", or "electronically sent". You will need to get another appointment to get these  prescribed. 4. Prescription Accuracy: You are responsible for carefully inspecting your prescriptions before leaving our office. Have the discharge nurse carefully go over each prescription with you, before taking them home. Make sure that your name is accurately spelled, that your address is correct. Check the name and dose of your medication to  make sure it is accurate. Check the number of pills, and the written instructions to make sure they are clear and accurate. Make sure that you are given enough medication to last until your next medication refill appointment. 5. Taking Medication: Take medication as prescribed. Never take more pills than instructed. Never take medication more frequently than prescribed. Taking less pills or less frequently is permitted and encouraged, when it comes to controlled substances (written prescriptions).  6. Inform other Doctors: Always inform, all of your healthcare providers, of all the medications you take. 7. Pain Medication from other Providers: You are not allowed to accept any additional pain medication from any other Doctor or Healthcare provider. There are two exceptions to this rule. (see below) In the event that you require additional pain medication, you are responsible for notifying us, as stated below. 8. Medication Agreement: You are responsible for carefully reading and following our Medication Agreement. This must be signed before receiving any prescriptions from our practice. Safely store a copy of your signed Agreement. Violations to the Agreement will result in no further prescriptions. (Additional copies of our Medication Agreement are available upon request.) 9. Laws, Rules, & Regulations: All patients are expected to follow all Federal and [employer], Rules, [employer]. Ignorance of the Laws does not constitute a valid excuse. The use of any illegal substances is prohibited. 10. Adopted CDC guidelines & recommendations: Target dosing  levels will be at or below 60 MME/day. Use of benzodiazepines** is not recommended.  Exceptions: There are only two exceptions to the rule of not receiving pain medications from other Healthcare Providers. 1. Exception #1 (Emergencies): In the event of an emergency (i.e.: accident requiring emergency care), you are allowed to receive additional pain medication. However, you are responsible for: As soon as you are able, call our office [phone], at any time of the day or night, and leave a message stating your name, the date and nature of the emergency, and the name and dose of the medication prescribed. In the event that your call is answered by a member of our staff, make sure to document and save the date, time, and the name of the person that took your information.  2. Exception #2 (Planned Surgery): In the event that you are scheduled by another doctor or dentist to have any type of surgery or procedure, you are allowed (for a period no longer than 30 days), to receive additional pain medication, for the acute post-op pain. However, in this case, you are responsible for picking up a copy of our "Post-op Pain Management for Surgeons" handout, and giving it to your surgeon or dentist. This document is available at our office, and does not require an appointment to obtain it. Simply go to our office during business hours (Monday-Thursday from 8:00 AM to 4:00 PM) (Friday 8:00 AM to 12:00 Noon) or if you have a scheduled appointment with [place], prior to your surgery, and ask for it by name. In addition, you will need to provide [place] with your name, name of your surgeon, type of surgery, and date of procedure or surgery.  *Opioid medications include: morphine, codeine, oxycodone, oxymorphone, hydrocodone, hydromorphone, meperidine, tramadol, tapentadol, buprenorphine, fentanyl, methadone. **Benzodiazepine medications include: diazepam (Valium), alprazolam (Xanax), clonazepam (Klonopine), lorazepam (Ativan),  clorazepate (Tranxene), chlordiazepoxide (Librium), estazolam (Prosom), oxazepam (Serax), temazepam (Restoril), triazolam (Halcion)  ____________________________________________________________________________________________  BMI Assessment: Estimated body mass index is 30.11 kg/m as calculated from the following:   Height as of this encounter: 5'  3" (1.6 m).   Weight as of this encounter: 170 lb (77.1 kg).  BMI interpretation table: BMI level Category Range association with higher incidence of chronic pain  <18 kg/m2 Underweight   18.5-24.9 kg/m2 Ideal body weight   25-29.9 kg/m2 Overweight Increased incidence by 20%  30-34.9 kg/m2 Obese (Class I) Increased incidence by 68%  35-39.9 kg/m2 Severe obesity (Class II) Increased incidence by 136%  >40 kg/m2 Extreme obesity (Class III) Increased incidence by 254%   BMI Readings from Last 4 Encounters:  05/07/17 30.11 kg/m  02/05/17 30.11 kg/m  01/08/17 28.09 kg/m  12/04/16 30.11 kg/m   Wt Readings from Last 4 Encounters:  05/07/17 170 lb (77.1 kg)  02/05/17 170 lb (77.1 kg)  01/08/17 168 lb 12.8 oz (76.6 kg)  12/04/16 170 lb (77.1 kg)  Pain Management Discharge Instructions  General Discharge Instructions :  If you need to reach your doctor call: Monday-Friday 8:00 am - 4:00 pm at [phone] or toll free [phone].  After clinic hours [phone] to have operator reach doctor.  Bring all of your medication bottles to all your appointments in the pain clinic.  To cancel or reschedule your appointment with Pain Management please remember to call 24 hours in advance to avoid a fee.  Refer to the educational materials which you have been given on: General Risks, I had my Procedure. Discharge Instructions, Post Sedation.  Post Procedure Instructions:  The drugs you were given will stay in your system until tomorrow, so for the next 24 hours you should not drive, make any legal decisions or drink any alcoholic  beverages.  You may eat anything you prefer, but it is better to start with liquids then soups and crackers, and gradually work up to solid foods.  Please notify your doctor immediately if you have any unusual bleeding, trouble breathing or pain that is not related to your normal pain.  Depending on the type of procedure that was done, some parts of your body may feel week and/or numb.  This usually clears up by tonight or the next day.  Walk with the use of an assistive device or accompanied by an adult for the 24 hours.  You may use ice on the affected area for the first 24 hours.  Put ice in a Ziploc bag and cover with a towel and place against area 15 minutes on 15 minutes off.  You may switch to heat after 24 hours.

## 2017-06-16 ENCOUNTER — Encounter: Payer: Self-pay | Admitting: Emergency Medicine

## 2017-06-16 ENCOUNTER — Emergency Department
Admission: EM | Admit: 2017-06-16 | Discharge: 2017-06-16 | Disposition: A | Discharge status: Home / Self Care | Payer: Medicare Other | Attending: Emergency Medicine | Admitting: Emergency Medicine

## 2017-06-16 ENCOUNTER — Emergency Department: Payer: Medicare Other

## 2017-06-16 DIAGNOSIS — R079 Chest pain, unspecified: Secondary | ICD-10-CM

## 2017-06-16 DIAGNOSIS — I10 Essential (primary) hypertension: Secondary | ICD-10-CM | POA: Insufficient documentation

## 2017-06-16 DIAGNOSIS — R0789 Other chest pain: Secondary | ICD-10-CM | POA: Diagnosis not present

## 2017-06-16 DIAGNOSIS — Z951 Presence of aortocoronary bypass graft: Secondary | ICD-10-CM | POA: Insufficient documentation

## 2017-06-16 DIAGNOSIS — E876 Hypokalemia: Secondary | ICD-10-CM | POA: Insufficient documentation

## 2017-06-16 DIAGNOSIS — I428 Other cardiomyopathies: Secondary | ICD-10-CM | POA: Diagnosis not present

## 2017-06-16 DIAGNOSIS — Z87891 Personal history of nicotine dependence: Secondary | ICD-10-CM | POA: Diagnosis not present

## 2017-06-16 DIAGNOSIS — Z7982 Long term (current) use of aspirin: Secondary | ICD-10-CM | POA: Insufficient documentation

## 2017-06-16 DIAGNOSIS — I2510 Atherosclerotic heart disease of native coronary artery without angina pectoris: Secondary | ICD-10-CM | POA: Insufficient documentation

## 2017-06-16 DIAGNOSIS — E785 Hyperlipidemia, unspecified: Secondary | ICD-10-CM | POA: Insufficient documentation

## 2017-06-16 DIAGNOSIS — Z79899 Other long term (current) drug therapy: Secondary | ICD-10-CM | POA: Insufficient documentation

## 2017-06-16 LAB — TROPONIN I: Troponin I: <0.03 ng/mL (ref <0.03)

## 2017-06-16 LAB — BASIC METABOLIC PANEL
Anion gap: 14 (ref 5–15)
BUN: 24 mg/dL — AB (ref 6–20)
CHLORIDE: 97 mmol/L — AB (ref 101–111)
CO2: 25 mmol/L (ref 22–32)
CREATININE: 1.02 mg/dL (ref 0.61–1.24)
Calcium: 9.2 mg/dL (ref 8.9–10.3)
GFR calc non Af Amer: >60 mL/min (ref >60)
Glucose, Bld: 203 mg/dL — ABNORMAL HIGH (ref 65–99)
POTASSIUM: 3.2 mmol/L — AB (ref 3.5–5.1)
Sodium: 136 mmol/L (ref 135–145)

## 2017-06-16 LAB — CBC
HEMATOCRIT: 39.6 % — AB (ref 40.0–52.0)
Hemoglobin: 13.9 g/dL (ref 13.0–18.0)
MCH: 31.3 pg (ref 26.0–34.0)
MCHC: 35.1 g/dL (ref 32.0–36.0)
MCV: 89.2 fL (ref 80.0–100.0)
PLATELETS: 192 10*3/uL (ref 150–440)
RBC: 4.44 MIL/uL (ref 4.40–5.90)
RDW: 13.1 % (ref 11.5–14.5)
WBC: 6.7 10*3/uL (ref 3.8–10.6)

## 2017-06-16 LAB — FIBRIN DERIVATIVES D-DIMER (ARMC ONLY): Fibrin derivatives D-dimer (ARMC): 363.8 ng/mL (FEU) (ref 0.00–499.00)

## 2017-06-16 MED ORDER — KETOROLAC TROMETHAMINE 30 MG/ML IJ SOLN
10.0000 mg | Freq: Once | INTRAMUSCULAR | Status: AC
  Administered 2017-06-16: 9.9 mg via INTRAVENOUS
  Filled 2017-06-16: qty 1

## 2017-06-16 MED ORDER — POTASSIUM CHLORIDE CRYS ER 20 MEQ PO TBCR
40.0000 meq | EXTENDED_RELEASE_TABLET | Freq: Once | ORAL | Status: AC
  Administered 2017-06-16: 40 meq via ORAL
  Filled 2017-06-16: qty 2

## 2017-06-16 NOTE — ED Provider Notes (Signed)
[facility] Emergency Department Provider Note   ____________________________________________   First MD Initiated Contact with Patient 06/16/17 [phone]     (approximate)  I have reviewed the triage vital signs and the nursing notes.   HISTORY  Chief Complaint Chest Pain    HPI [name] [name]. is a 61 y.o. male brought to the ED from home with a chief complaint of chest pain.Patient has a history of CAD status post CABG as well as chronic chest wall pain followed by the pain clinic. Reports a several-day history of intermittent left, sharp chest pain. Describes nonradiating pain not associated with diaphoresis, shortness of breath, nauseous or vomiting, palpitations or dizziness. Sometimes the pain goes away on its own and sometimes patient takes a nitroglycerin which alleviates the pain. Denies recent fever, chills, cough, abdominal pain, GERD. Denies recent travel or trauma.   Past Medical History:  Diagnosis Date  . Benign esophageal stricture   . Cardiomyopathy, secondary ([place])   . Coronary artery disease   . Diverticulitis   . GI bleed   . Gout   . Hemorrhoids   . Hyperlipidemia   . Hypertension   . Sleep apnea   . Tubular adenoma of colon     Patient Active Problem List   Diagnosis Date Noted  . Chronic pain syndrome 08/16/2016  . Pneumonia 05/28/2016  . Pressure injury of skin 05/28/2016  . Opioid-induced constipation (OIC) 05/17/2016  . Hypokalemia 05/01/2016  . 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 Carotid atherosclerosis 04/04/2016  . Hypoalbuminemia 04/04/2016  . Right sided weakness 04/02/2016  . S/P CABG (coronary artery bypass graft) 03/30/2016  . Chest pain 02/29/2016  . Unstable angina ([place]) 02/24/2016  . Keloid skin disorder 01/17/2016  . Costochondritis 01/17/2016  . Long term current use of opiate analgesic 12/23/2015  . Long term prescription opiate use 12/23/2015  . Vitamin D insufficiency 11/21/2015  .  Lumbar facet syndrome (Bilateral) (R>L) 11/21/2015  . Lumbar spondylosis 11/21/2015  . Elevated sedimentation rate 11/01/2015  . Chronic low back pain (Bilateral) (R>L) 10/25/2015  . Chronic shoulder pain (Location of Tertiary source of pain) (Right) 10/25/2015  . Chronic hip pain (Left) 10/25/2015  . History of alcoholism ([place]) 10/25/2015  . Opiate use 10/25/2015  . Encounter for therapeutic drug level monitoring 10/25/2015  . Chronic chest wall pain (Location of Primary Source of Pain) (Incisional Midline) (since 04/22/2012) 10/25/2015  . Chronic lower extremity pain (Location of Secondary source of pain) (Bilateral) (R>L) 10/25/2015  . History of MI (myocardial infarction) (January 2014) 10/25/2015  . Encounter for pain management planning 10/25/2015  . Chronic sacroiliac joint pain (Bilateral) (L>R) 10/25/2015  . Neuropathic pain 10/25/2015  . Neurogenic pain 10/25/2015  . Incisional pain 10/25/2015  . HLD (hyperlipidemia) 03/16/2014  . H/O coronary artery bypass surgery 05/12/2012  . Alcohol withdrawal syndrome ([place]) 04/28/2012  . Injury of kidney 04/25/2012  . OSA (obstructive sleep apnea) 04/22/2012  . Hypertension 04/22/2012  . Coronary artery disease 04/22/2012  . Apnea, sleep 04/21/2012  . Chronic obstructive pulmonary disease ([place]) 04/21/2012  . COPD (chronic obstructive pulmonary disease) ([place]) 04/21/2012    Past Surgical History:  Procedure Laterality Date  . cardiac bypass    . CARDIAC CATHETERIZATION Left 02/03/2016   Procedure: Left Heart Cath and Coronary Angiography;  Surgeon: Teodoro Spray, MD;  Location: [place] CV LAB;  Service: Cardiovascular;  Laterality: Left;  . CARDIAC SURGERY    . COLONOSCOPY WITH PROPOFOL N/A 04/29/2015  Procedure: COLONOSCOPY WITH PROPOFOL;  Surgeon: Hulen Luster, MD;  Location: [facility] ENDOSCOPY;  Service: Gastroenterology;  Laterality: N/A;  . CORONARY ANGIOPLASTY WITH STENT PLACEMENT     x2  . CORONARY ARTERY BYPASS GRAFT    .  ESOPHAGOGASTRODUODENOSCOPY (EGD) WITH PROPOFOL N/A 04/29/2015   Procedure: ESOPHAGOGASTRODUODENOSCOPY (EGD) WITH PROPOFOL;  Surgeon: Hulen Luster, MD;  Location: [facility] ENDOSCOPY;  Service: Gastroenterology;  Laterality: N/A;  . SEPTOPLASTY      Prior to Admission medications   Medication Sig Start Date End Date Taking? Authorizing Provider  allopurinol (ZYLOPRIM) 300 MG tablet Take 300 mg by mouth daily. *patient takes in the afternoon*    [provider]  amLODipine (NORVASC) 10 MG tablet Take 10 mg by mouth daily.  07/23/16   [provider]  aspirin EC 81 MG tablet Take 81 mg by mouth every morning.    [provider]  atorvastatin (LIPITOR) 40 MG tablet Take 40 mg by mouth at bedtime.     [provider]  Cholecalciferol (VITAMIN D3) 2000 units TABS Take 2,000 Units by mouth every morning.    [provider]  gabapentin (NEURONTIN) 600 MG tablet Take 1 tablet (600 mg total) by mouth 2 (two) times daily. 05/16/17 08/14/17  Vevelyn Francois, NP  hydrochlorothiazide (HYDRODIURIL) 25 MG tablet Take 25 mg by mouth daily.  07/23/16   [provider]  hydrochlorothiazide (HYDRODIURIL) 25 MG tablet Take by mouth. 07/23/16   [provider]  HYDROcodone-acetaminophen (NORCO/VICODIN) 5-325 MG tablet Take 1 tablet by mouth 2 (two) times daily as needed for severe pain. 05/16/17 06/15/17  Vevelyn Francois, NP  HYDROcodone-acetaminophen (NORCO/VICODIN) 5-325 MG tablet Take 1 tablet by mouth 2 (two) times daily as needed for severe pain. 06/15/17 07/15/17  Vevelyn Francois, NP  HYDROcodone-acetaminophen (NORCO/VICODIN) 5-325 MG tablet Take 1 tablet by mouth 2 (two) times daily as needed for severe pain. 07/15/17 08/14/17  Vevelyn Francois, NP  isosorbide mononitrate (IMDUR) 30 MG 24 hr tablet Take 30 mg by mouth every morning.     [provider]  isosorbide mononitrate (IMDUR) 30 MG 24 hr tablet Take by mouth. 02/27/17   [provider]    losartan (COZAAR) 100 MG tablet take 1 tablet by mouth once daily 08/02/16   [provider]  losartan (COZAAR) 100 MG tablet Take by mouth. 02/27/17   [provider]  metoprolol (LOPRESSOR) 50 MG tablet Take 50 mg by mouth 3 (three) times daily.    [provider]  Multiple Vitamin (MULTIVITAMIN WITH MINERALS) TABS tablet Take 1 tablet by mouth every evening.    [provider]  nitroGLYCERIN (NITROSTAT) 0.4 MG SL tablet Place 0.4 mg under the tongue every 5 (five) minutes x 3 doses as needed for chest pain.  03/07/16   [provider]  Potassium 95 MG TABS Take 95 mg by mouth every morning.     [provider]    Allergies Patient has no known allergies.  Family History  Problem Relation Age of Onset  . Heart disease Mother   . Hypertension Father     Social History Social History  Substance Use Topics  . Smoking status: Former Smoker    Years: 25.00  . Smokeless tobacco: Former Systems developer  . Alcohol use Yes     Comment: occasional    Review of Systems  Constitutional: No fever/chills. Eyes: No visual changes. ENT: No sore throat. Cardiovascular: positive for chest pain. Respiratory: Denies shortness  of breath. Gastrointestinal: No abdominal pain.  No nausea, no vomiting.  No diarrhea.  No constipation. Genitourinary: Negative for dysuria. Musculoskeletal: Negative for back pain. Skin: Negative for rash. Neurological: Negative for headaches, focal weakness or numbness.   ____________________________________________   PHYSICAL EXAM:  VITAL SIGNS: ED Triage Vitals  Enc Vitals Group     BP 06/16/17 0233 (!) 160/81     Pulse Rate 06/16/17 0233 74     Resp 06/16/17 0233 19     Temp 06/16/17 0233 97.6 F (36.4 C)     Temp Source 06/16/17 0233 Oral     SpO2 06/16/17 0233 96 %     Weight 06/16/17 0228 170 lb (77.1 kg)     Height 06/16/17 0228 5\' 3"  (1.6 m)     Head Circumference --      Peak Flow --      Pain Score  06/16/17 0227 0     Pain Loc --      Pain Edu? --      Excl. in [place]? --     Constitutional: Alert and oriented. Well appearing and in no acute distress. Eyes: Conjunctivae are normal. PERRL. EOMI. Head: Atraumatic. Nose: No congestion/rhinnorhea. Mouth/Throat: Mucous membranes are moist.  Oropharynx non-erythematous. Neck: No stridor.  No carotid bruits. Cardiovascular: Normal rate, regular rhythm. Grossly normal heart sounds.  Good peripheral circulation. Respiratory: Normal respiratory effort.  No retractions. Lungs CTAB. Left anterior chest wall tender to palpation and with movement of trunk.  Gastrointestinal: Soft and nontender. No distention. No abdominal bruits. No CVA tenderness. Musculoskeletal: No lower extremity tenderness nor edema.  No joint effusions. Neurologic:  Normal speech and language. No gross focal neurologic deficits are appreciated.  Skin:  Skin is warm, dry and intact. No rash noted. Psychiatric: Mood and affect are normal. Speech and behavior are normal.  ____________________________________________   LABS (all labs ordered are listed, but only abnormal results are displayed)  Labs Reviewed  BASIC METABOLIC PANEL - Abnormal; Notable for the following:       Result Value   Potassium 3.2 (*)    Chloride 97 (*)    Glucose, Bld 203 (*)    BUN 24 (*)    All other components within normal limits  CBC - Abnormal; Notable for the following:    HCT 39.6 (*)    All other components within normal limits  TROPONIN I  FIBRIN DERIVATIVES D-DIMER ([facility] ONLY)  TROPONIN I   ____________________________________________  EKG  ED ECG REPORT I, SUNG,JADE J, the attending physician, personally viewed and interpreted this ECG.   Date: 06/16/2017  EKG Time: 0228  Rate: 71  Rhythm: normal EKG, normal sinus rhythm  Axis: LAD  Intervals:right bundle branch block  ST&T Change: nonspecific  ____________________________________________  RADIOLOGY  Dg Chest 2  View  Result Date: 06/16/2017 CLINICAL DATA:  Acute onset of generalized chest pain. High blood pressure. Initial encounter. EXAM: CHEST  2 VIEW COMPARISON:  Chest radiograph performed 09/12/2016 FINDINGS: The lungs are well-aerated. Pulmonary vascularity is at the upper limits of normal. There is no evidence of focal opacification, pleural effusion or pneumothorax. The heart is borderline normal in size. The patient is status post median sternotomy, with evidence of prior CABG. No acute osseous abnormalities are seen. Clips are noted at the right axilla. IMPRESSION: No acute cardiopulmonary process seen. Electronically Signed   By: Garald Balding M.D.   On: 06/16/2017 03:11    ____________________________________________   PROCEDURES  Procedure(s) performed: None  Procedures  Critical Care performed: No  ____________________________________________   INITIAL IMPRESSION / ASSESSMENT AND PLAN / ED COURSE  As part of my medical decision making, I reviewed the following data within the [place] notes reviewed and incorporated, Labs reviewed, EKG interpreted, Radiograph reviewed and Notes from prior ED visits.   61 year old male with CAD status post CABG as well as chronic chest wall pain who sees pain management presenting with a several-day history of sharp, reproducible left anterior chest wall pain. Differential diagnosis includes, but is not limited to, ACS, aortic dissection, pulmonary embolism, cardiac tamponade, pneumothorax, pneumonia, pericarditis/myocarditis, GI-related causes including esophagitis/gastritis, and musculoskeletal chest wall pain.  Initial EKG and troponin are unremarkable. Potassium repleted. Will check repeat troponin as well as d-dimer. Non-opioid analgesic administered.  ----------------------------------------- 4:09 AM on 06/16/2017 -----------------------------------------   OBSERVATION CARE: This patient is being placed under  observation care for the following reasons: Chest pain with repeat testing to rule out ischemia   Clinical Course as of Jun 16 658  Sun Jun 16, 2017  0657 Patient sleeping in no acute distress. Updated him of repeat troponin and d-dimer results. He will follow up closely with his cardiologist early next week. Strict return precautions given. Patient verbalizes understanding and agrees with plan of care.  [JS]    Clinical Course User Index [JS] Paulette Blanch, MD    ----------------------------------------- 6:58 AM on 06/16/2017 -----------------------------------------   END OF OBSERVATION STATUS: After an appropriate period of observation, this patient is being discharged due to the following reason(s):  2 sets of negative cardiac enzymes, negative D-dimer, reproducible and atypical chest pain. ____________________________________________   FINAL CLINICAL IMPRESSION(S) / ED DIAGNOSES  Final diagnoses:  Nonspecific chest pain  Chest wall pain  Hypokalemia      NEW MEDICATIONS STARTED DURING THIS VISIT:  New Prescriptions   No medications on file     Note:  This document was prepared using Dragon voice recognition software and may include unintentional dictation errors.    Paulette Blanch, MD 06/16/17 [phone]

## 2017-06-16 NOTE — ED Triage Notes (Signed)
Pt arrives via [facility] with c/o chest pain over the last several days. Pt denies any cardiac symptoms at this time. Per EMS, pt VS WDL with the exception of slightly elevated BP of 169/85. Pt is in NAD at this time.

## 2017-06-16 NOTE — Discharge Instructions (Signed)
Continue all of your medicines as directed by your doctor. Return to the ER for worsening symptoms, persistent vomiting, difficulty breathing or other concerns.

## 2017-08-06 ENCOUNTER — Encounter: Payer: Self-pay | Admitting: Nurse Practitioner

## 2017-08-06 ENCOUNTER — Ambulatory Visit: Payer: Medicare Other | Attending: Nurse Practitioner | Admitting: Nurse Practitioner

## 2017-08-06 VITALS — BP 92/63 | HR 71 | Resp 16 | Ht 63.0 in | Wt 170.0 lb

## 2017-08-06 DIAGNOSIS — Z79899 Other long term (current) drug therapy: Secondary | ICD-10-CM | POA: Insufficient documentation

## 2017-08-06 DIAGNOSIS — I25110 Atherosclerotic heart disease of native coronary artery with unstable angina pectoris: Secondary | ICD-10-CM | POA: Insufficient documentation

## 2017-08-06 DIAGNOSIS — M25512 Pain in left shoulder: Secondary | ICD-10-CM | POA: Insufficient documentation

## 2017-08-06 DIAGNOSIS — G8918 Other acute postprocedural pain: Secondary | ICD-10-CM | POA: Insufficient documentation

## 2017-08-06 DIAGNOSIS — M79604 Pain in right leg: Secondary | ICD-10-CM | POA: Insufficient documentation

## 2017-08-06 DIAGNOSIS — E876 Hypokalemia: Secondary | ICD-10-CM | POA: Insufficient documentation

## 2017-08-06 DIAGNOSIS — R091 Pleurisy: Secondary | ICD-10-CM | POA: Insufficient documentation

## 2017-08-06 DIAGNOSIS — M79605 Pain in left leg: Secondary | ICD-10-CM | POA: Diagnosis not present

## 2017-08-06 DIAGNOSIS — M47816 Spondylosis without myelopathy or radiculopathy, lumbar region: Secondary | ICD-10-CM | POA: Insufficient documentation

## 2017-08-06 DIAGNOSIS — E785 Hyperlipidemia, unspecified: Secondary | ICD-10-CM | POA: Insufficient documentation

## 2017-08-06 DIAGNOSIS — J449 Chronic obstructive pulmonary disease, unspecified: Secondary | ICD-10-CM | POA: Insufficient documentation

## 2017-08-06 DIAGNOSIS — M5442 Lumbago with sciatica, left side: Secondary | ICD-10-CM | POA: Insufficient documentation

## 2017-08-06 DIAGNOSIS — Z7982 Long term (current) use of aspirin: Secondary | ICD-10-CM | POA: Insufficient documentation

## 2017-08-06 DIAGNOSIS — I10 Essential (primary) hypertension: Secondary | ICD-10-CM | POA: Diagnosis not present

## 2017-08-06 DIAGNOSIS — M25552 Pain in left hip: Secondary | ICD-10-CM | POA: Diagnosis present

## 2017-08-06 DIAGNOSIS — Z79891 Long term (current) use of opiate analgesic: Secondary | ICD-10-CM | POA: Insufficient documentation

## 2017-08-06 DIAGNOSIS — M792 Neuralgia and neuritis, unspecified: Secondary | ICD-10-CM

## 2017-08-06 DIAGNOSIS — M5441 Lumbago with sciatica, right side: Secondary | ICD-10-CM | POA: Insufficient documentation

## 2017-08-06 DIAGNOSIS — M533 Sacrococcygeal disorders, not elsewhere classified: Secondary | ICD-10-CM | POA: Insufficient documentation

## 2017-08-06 DIAGNOSIS — Z951 Presence of aortocoronary bypass graft: Secondary | ICD-10-CM | POA: Diagnosis not present

## 2017-08-06 DIAGNOSIS — G8929 Other chronic pain: Secondary | ICD-10-CM

## 2017-08-06 DIAGNOSIS — I252 Old myocardial infarction: Secondary | ICD-10-CM | POA: Insufficient documentation

## 2017-08-06 DIAGNOSIS — Z955 Presence of coronary angioplasty implant and graft: Secondary | ICD-10-CM | POA: Diagnosis not present

## 2017-08-06 DIAGNOSIS — R0789 Other chest pain: Secondary | ICD-10-CM

## 2017-08-06 DIAGNOSIS — G894 Chronic pain syndrome: Secondary | ICD-10-CM | POA: Diagnosis not present

## 2017-08-06 DIAGNOSIS — E559 Vitamin D deficiency, unspecified: Secondary | ICD-10-CM | POA: Insufficient documentation

## 2017-08-06 DIAGNOSIS — M25551 Pain in right hip: Secondary | ICD-10-CM | POA: Diagnosis present

## 2017-08-06 MED ORDER — GABAPENTIN 600 MG PO TABS: 600.0000 mg | ORAL_TABLET | Freq: Two times a day (BID) | ORAL | 0 refills | Status: DC

## 2017-08-06 MED ORDER — HYDROCODONE-ACETAMINOPHEN 5-325 MG PO TABS: 1.0000 | ORAL_TABLET | Freq: Two times a day (BID) | ORAL | 0 refills | Status: DC | PRN

## 2017-08-06 NOTE — Patient Instructions (Addendum)
____________________________________________________________________________________________  Medication Rules  Applies to: All patients receiving prescriptions (written or electronic).  Pharmacy of record: Pharmacy where electronic prescriptions will be sent. If written prescriptions are taken to a different pharmacy, please inform the nursing staff. The pharmacy listed in the electronic medical record should be the one where you would like electronic prescriptions to be sent.  Prescription refills: Only during scheduled appointments. Applies to both, written and electronic prescriptions.  NOTE: The following applies primarily to controlled substances (Opioid* Pain Medications).   Patient's responsibilities: 1. Pain Pills: Bring all pain pills to every appointment (except for procedure appointments). 2. Pill Bottles: Bring pills in original pharmacy bottle. Always bring newest bottle. Bring bottle, even if empty. 3. Medication refills: You are responsible for knowing and keeping track of what medications you need refilled. The day before your appointment, write a list of all prescriptions that need to be refilled. Bring that list to your appointment and give it to the admitting nurse. Prescriptions will be written only during appointments. If you forget a medication, it will not be "Called in", "Faxed", or "electronically sent". You will need to get another appointment to get these prescribed. 4. Prescription Accuracy: You are responsible for carefully inspecting your prescriptions before leaving our office. Have the discharge nurse carefully go over each prescription with you, before taking them home. Make sure that your name is accurately spelled, that your address is correct. Check the name and dose of your medication to make sure it is accurate. Check the number of pills, and the written instructions to make sure they are clear and accurate. Make sure that you are given enough medication to  last until your next medication refill appointment. 5. Taking Medication: Take medication as prescribed. Never take more pills than instructed. Never take medication more frequently than prescribed. Taking less pills or less frequently is permitted and encouraged, when it comes to controlled substances (written prescriptions).  6. Inform other Doctors: Always inform, all of your healthcare providers, of all the medications you take. 7. Pain Medication from other Providers: You are not allowed to accept any additional pain medication from any other Doctor or Healthcare provider. There are two exceptions to this rule. (see below) In the event that you require additional pain medication, you are responsible for notifying us, as stated below. 8. Medication Agreement: You are responsible for carefully reading and following our Medication Agreement. This must be signed before receiving any prescriptions from our practice. Safely store a copy of your signed Agreement. Violations to the Agreement will result in no further prescriptions. (Additional copies of our Medication Agreement are available upon request.) 9. Laws, Rules, & Regulations: All patients are expected to follow all Federal and State Laws, Statutes, Rules, & Regulations. Ignorance of the Laws does not constitute a valid excuse. The use of any illegal substances is prohibited. 10. Adopted CDC guidelines & recommendations: Target dosing levels will be at or below 60 MME/day. Use of benzodiazepines** is not recommended.  Exceptions: There are only two exceptions to the rule of not receiving pain medications from other Healthcare Providers. 1. Exception #1 (Emergencies): In the event of an emergency (i.e.: accident requiring emergency care), you are allowed to receive additional pain medication. However, you are responsible for: As soon as you are able, call our office [phone], at any time of the day or night, and leave a message stating your  name, the date and nature of the emergency, and the name and dose of the medication   prescribed. In the event that your call is answered by a member of our staff, make sure to document and save the date, time, and the name of the person that took your information.  2. Exception #2 (Planned Surgery): In the event that you are scheduled by another doctor or dentist to have any type of surgery or procedure, you are allowed (for a period no longer than 30 days), to receive additional pain medication, for the acute post-op pain. However, in this case, you are responsible for picking up a copy of our "Post-op Pain Management for Surgeons" handout, and giving it to your surgeon or dentist. This document is available at our office, and does not require an appointment to obtain it. Simply go to our office during business hours (Monday-Thursday from 8:00 AM to 4:00 PM) (Friday 8:00 AM to 12:00 Noon) or if you have a scheduled appointment with [place], prior to your surgery, and ask for it by name. In addition, you will need to provide [place] with your name, name of your surgeon, type of surgery, and date of procedure or surgery.  *Opioid medications include: morphine, codeine, oxycodone, oxymorphone, hydrocodone, hydromorphone, meperidine, tramadol, tapentadol, buprenorphine, fentanyl, methadone. **Benzodiazepine medications include: diazepam (Valium), alprazolam (Xanax), clonazepam (Klonopine), lorazepam (Ativan), clorazepate (Tranxene), chlordiazepoxide (Librium), estazolam (Prosom), oxazepam (Serax), temazepam (Restoril), triazolam (Halcion)  ____________________________________________________________________________________________   BMI Assessment: Estimated body mass index is 30.11 kg/m as calculated from the following:   Height as of this encounter: 5\' 3"  (1.6 m).   Weight as of this encounter: 170 lb (77.1 kg).  BMI interpretation table: BMI level Category Range association with higher incidence of chronic pain   <18 kg/m2 Underweight   18.5-24.9 kg/m2 Ideal body weight   25-29.9 kg/m2 Overweight Increased incidence by 20%  30-34.9 kg/m2 Obese (Class I) Increased incidence by 68%  35-39.9 kg/m2 Severe obesity (Class II) Increased incidence by 136%  >40 kg/m2 Extreme obesity (Class III) Increased incidence by 254%   BMI Readings from Last 4 Encounters:  08/06/17 30.11 kg/m  06/16/17 30.11 kg/m  05/07/17 30.11 kg/m  02/05/17 30.11 kg/m   Wt Readings from Last 4 Encounters:  08/06/17 170 lb (77.1 kg)  06/16/17 170 lb (77.1 kg)  05/07/17 170 lb (77.1 kg)  02/05/17 170 lb (77.1 kg)

## 2017-08-06 NOTE — Progress Notes (Signed)
Nursing Pain Medication Assessment:  Safety precautions to be maintained throughout the outpatient stay will include: orient to surroundings, keep bed in low position, maintain call bell within reach at all times, provide assistance with transfer out of bed and ambulation.  Medication Inspection Compliance: Pill count conducted under aseptic conditions, in front of the patient. Neither the pills nor the bottle was removed from the patient's sight at any time. Once count was completed pills were immediately returned to the patient in their original bottle.  Medication: Hydrocodone/APAP Pill/Patch Count: 23 of 60 pills remain Pill/Patch Appearance: Markings consistent with prescribed medication Bottle Appearance: Standard pharmacy container. Clearly labeled. Filled Date: 73 / 12 / 2018 Last Medication intake:  Today

## 2017-08-06 NOTE — Progress Notes (Signed)
Patient's Name: [name] [name].  MRN: [id number]  Referring Provider: Elisabeth Cara, NP  DOB: [birth year]  PCP: Elisabeth Cara, NP  DOS: 08/06/2017  Note by: Vevelyn Francois NP  Service setting: Ambulatory outpatient  Specialty: Interventional Pain Management  Location: [facility] (AMB) Pain Management Facility    Patient type: Established    Primary Reason(s) for Visit: Encounter for prescription drug management. (Level of risk: moderate)  CC: Pleurisy (incisional pain); Leg Pain (bilateral); and Hip Pain (bilateral)  HPI  [name] [name] is a 61 y.o. year old, male patient, who comes today for a medication management evaluation. He has Apnea, sleep; H/O coronary artery bypass surgery; OSA (obstructive sleep apnea); Hypertension; HLD (hyperlipidemia); Coronary artery disease; Chronic obstructive pulmonary disease ([place]); Alcohol withdrawal syndrome ([place]); Injury of kidney; Chronic low back pain (Bilateral) (R>L); Chronic shoulder pain (Location of Tertiary source of pain) (Right); Chronic hip pain (Left); History of alcoholism ([place]); Opiate use; Encounter for therapeutic drug level monitoring; Chronic chest wall pain (Location of Primary Source of Pain) (Incisional Midline) (since 04/22/2012); Chronic lower extremity pain (Location of Secondary source of pain) (Bilateral) (R>L); History of MI (myocardial infarction) (January 2014); Encounter for pain management planning; Chronic sacroiliac joint pain (Bilateral) (L>R); Neuropathic pain; Neurogenic pain; Incisional pain; Elevated sedimentation rate; Vitamin D insufficiency; Lumbar facet syndrome (Bilateral) (R>L); Lumbar spondylosis; Long term current use of opiate analgesic; Long term prescription opiate use; Keloid skin disorder; Costochondritis; Unstable angina ([place]); Chest pain; Brachial plexus injury, right; Carotid atherosclerosis; Hypoalbuminemia; Hypokalemia; Right sided weakness; S/P CABG (coronary artery bypass graft); 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 His primarily concern today is the Pleurisy (incisional pain); Leg Pain (bilateral); and Hip Pain (bilateral)  Pain Assessment: Location: (leg pain bilateral, hip pain, leg pain is post surgical pain) Chest(legs) Radiating: na Onset: More than a month ago Duration: Chronic pain Quality: Aching, Discomfort, Constant Severity: 4 /10 (self-reported pain score)  Note: Reported level is compatible with observation.                          Effect on ADL: does what he can.  walking causes pain Timing: Constant Modifying factors: resting  [name] [name] was last scheduled for an appointment on 05/07/2017 for medication management. During today's appointment we reviewed Mr. Situ chronic pain status, as well as his outpatient medication regimen. He admits that his pain is stable. He denies any concerns today,.   The patient  reports that he does not use drugs. His body mass index is 30.11 kg/m.  Further details on both, my assessment(s), as well as the proposed treatment plan, please see below.  Controlled Substance Pharmacotherapy Assessment REMS (Risk Evaluation and Mitigation Strategy)  Analgesic:Hydrocodone/APAP 5/325 one tablet twice a day (10 mg per day) MME/day:10 mg/day.     Janett Billow, RN  08/06/2017 12:59 PM  Sign at close encounter Nursing Pain Medication Assessment:  Safety precautions to be maintained throughout the outpatient stay will include: orient to surroundings, keep bed in low position, maintain call bell within reach at all times, provide assistance with transfer out of bed and ambulation.  Medication Inspection Compliance: Pill count conducted under aseptic conditions, in front of the patient. Neither the pills nor the bottle was removed from the patient's sight at any time. Once count was completed pills were immediately  returned to the patient in their  original bottle.  Medication: Hydrocodone/APAP Pill/Patch Count: 23 of 60 pills remain Pill/Patch Appearance: Markings consistent with prescribed medication Bottle Appearance: Standard pharmacy container. Clearly labeled. Filled Date: 57 / 12 / 2018 Last Medication intake:  Today   Pharmacokinetics: Liberation and absorption (onset of action): WNL Distribution (time to peak effect): WNL Metabolism and excretion (duration of action): WNL         Pharmacodynamics: Desired effects: Analgesia: [name] reports >50% benefit. Functional ability: Patient reports that medication allows him to accomplish basic ADLs Clinically meaningful improvement in function (CMIF): Sustained CMIF goals met Perceived effectiveness: Described as relatively effective, allowing for increase in activities of daily living (ADL) Undesirable effects: Side-effects or Adverse reactions: None reported Monitoring: [place] PMP: Online review of the past 81-monthperiod conducted. Compliant with practice rules and regulations Last UDS on record: Summary  Date Value Ref Range Status  02/05/2017 FINAL  Final    Comment:    ==================================================================== TOXASSURE SELECT 13 (MW) ==================================================================== Test                             Result       Flag       Units Drug Present and Declared for Prescription Verification   Hydrocodone                    333          EXPECTED   ng/mg creat   Hydromorphone                  155          EXPECTED   ng/mg creat   Dihydrocodeine                 32           EXPECTED   ng/mg creat   Norhydrocodone                 299          EXPECTED   ng/mg creat    Sources of hydrocodone include scheduled prescription    medications. Hydromorphone, dihydrocodeine and norhydrocodone are    expected metabolites of hydrocodone. Hydromorphone and    dihydrocodeine are also  available as scheduled prescription    medications. ==================================================================== Test                      Result    Flag   Units      Ref Range   Creatinine              199              mg/dL      >=20 ==================================================================== Declared Medications:  The flagging and interpretation on this report are based on the  following declared medications.  Unexpected results may arise from  inaccuracies in the declared medications.  **Note: The testing scope of this panel includes these medications:  Hydrocodone (Hydrocodone-Acetaminophen)  **Note: The testing scope of this panel does not include following  reported medications:  Acetaminophen (Hydrocodone-Acetaminophen)  Allopurinol  Amlodipine  Aspirin (Aspirin 81)  Atorvastatin  Gabapentin (Neurontin)  Hydrochlorothiazide (Hydrodiuril)  Isosorbide (Imdur)  Losartan (Cozaar)  Metoprolol  Multivitamin  Nitroglycerin  Potassium  Vitamin D3 ==================================================================== For clinical consultation, please call ([phone] ====================================================================    UDS interpretation: Compliant          Medication Assessment  Form: Reviewed. Patient indicates being compliant with therapy Treatment compliance: Compliant Risk Assessment Profile: Aberrant behavior: See prior evaluations. None observed or detected today Comorbid factors increasing risk of overdose: See prior notes. No additional risks detected today Risk of substance use disorder (SUD): Low Opioid Risk Tool - 08/06/17 1256      Family History of Substance Abuse   Alcohol  Negative    Illegal Drugs  Negative    Rx Drugs  Negative      Personal History of Substance Abuse   Alcohol  Negative    Illegal Drugs  Negative    Rx Drugs  Negative      Psychological Disease   Psychological Disease  Negative    Depression   Negative      Total Score   Opioid Risk Tool Scoring  0    Opioid Risk Interpretation  Low Risk      ORT Scoring interpretation table:  Score <3 = Low Risk for SUD  Score between 4-7 = Moderate Risk for SUD  Score >8 = High Risk for Opioid Abuse   Risk Mitigation Strategies:  Patient Counseling: Covered Patient-Prescriber Agreement (PPA): Present and active  Notification to other healthcare providers: Done  Pharmacologic Plan: No change in therapy, at this time  Laboratory Chemistry  Inflammation Markers (CRP: Acute Phase) (ESR: Chronic Phase) Lab Results  Component Value Date   CRP <0.5 10/26/2015   ESRSEDRATE 32 (H) 10/26/2015   LATICACIDVEN 1.4 05/28/2016                 Rheumatology Markers No results found for: RF, ANA, LABURIC, URICUR, LYMEIGGIGMAB, LYMEABIGMQN              Renal Function Markers Lab Results  Component Value Date   BUN 24 (H) 06/16/2017   CREATININE 1.02 06/16/2017   GFRAA >60 06/16/2017   GFRNONAA >60 06/16/2017                 Hepatic Function Markers Lab Results  Component Value Date   AST 63 (H) 12/04/2016   ALT 55 12/04/2016   ALBUMIN 4.7 12/04/2016   ALKPHOS 62 12/04/2016   LIPASE 29 03/19/2015                 Electrolytes Lab Results  Component Value Date   NA 136 06/16/2017   K 3.2 (L) 06/16/2017   CL 97 (L) 06/16/2017   CALCIUM 9.2 06/16/2017   MG 2.0 10/26/2015                 Neuropathy Markers Lab Results  Component Value Date   HGBA1C 6.1 04/17/2012                 Bone Pathology Markers Lab Results  Component Value Date   VD25OH 26.5 (L) 10/26/2015   VD125OH2TOT 30.8 10/26/2015                 Coagulation Parameters Lab Results  Component Value Date   INR 0.97 02/09/2015   LABPROT 13.1 02/09/2015   APTT > 160.0 (HH) 04/21/2012   PLT 192 06/16/2017                 Cardiovascular Markers Lab Results  Component Value Date   BNP 255 (H) 08/01/2014   CKTOTAL 82 05/16/2013   CKMB 1.4 05/16/2013    TROPONINI <0.03 06/16/2017   HGB 13.9 06/16/2017   HCT 39.6 (L) 06/16/2017  CA Markers No results found for: CEA, CA125, LABCA2               Note: Lab results reviewed.  Recent Diagnostic Imaging Results  DG Chest 2 View CLINICAL DATA:  Acute onset of generalized chest pain. High blood pressure. Initial encounter.  EXAM: CHEST  2 VIEW  COMPARISON:  Chest radiograph performed 09/12/2016  FINDINGS: The lungs are well-aerated. Pulmonary vascularity is at the upper limits of normal. There is no evidence of focal opacification, pleural effusion or pneumothorax.  The heart is borderline normal in size. The patient is status post median sternotomy, with evidence of prior CABG. No acute osseous abnormalities are seen. Clips are noted at the right axilla.  IMPRESSION: No acute cardiopulmonary process seen.  Electronically Signed   By: Garald Balding M.D.   On: 06/16/2017 03:11  Complexity Note: Imaging results reviewed. Results shared with Mr. Pelaez, using Layman's terms.                         Meds   Current Outpatient Medications:  .  allopurinol (ZYLOPRIM) 300 MG tablet, Take 300 mg by mouth daily. *patient takes in the afternoon*, Disp: , Rfl:  .  amLODipine (NORVASC) 10 MG tablet, Take 10 mg by mouth daily. , Disp: , Rfl:  .  aspirin EC 81 MG tablet, Take 81 mg by mouth every morning., Disp: , Rfl:  .  atorvastatin (LIPITOR) 40 MG tablet, Take 40 mg by mouth at bedtime. , Disp: , Rfl:  .  Cholecalciferol (VITAMIN D3) 2000 units TABS, Take 2,000 Units by mouth every morning., Disp: , Rfl:  .  [START ON 08/14/2017] gabapentin (NEURONTIN) 600 MG tablet, Take 1 tablet (600 mg total) by mouth 2 (two) times daily., Disp: 180 tablet, Rfl: 0 .  hydrochlorothiazide (HYDRODIURIL) 25 MG tablet, Take 25 mg by mouth daily. , Disp: , Rfl:  .  [START ON 10/13/2017] HYDROcodone-acetaminophen (NORCO/VICODIN) 5-325 MG tablet, Take 1 tablet by mouth 2 (two) times daily as  needed for severe pain., Disp: 60 tablet, Rfl: 0 .  isosorbide mononitrate (IMDUR) 30 MG 24 hr tablet, Take 30 mg by mouth every morning. , Disp: , Rfl: 1 .  losartan (COZAAR) 100 MG tablet, take 1 tablet by mouth once daily, Disp: , Rfl:  .  metoprolol (LOPRESSOR) 50 MG tablet, Take 50 mg by mouth 3 (three) times daily., Disp: , Rfl:  .  Multiple Vitamin (MULTIVITAMIN WITH MINERALS) TABS tablet, Take 1 tablet by mouth every evening., Disp: , Rfl:  .  nitroGLYCERIN (NITROSTAT) 0.4 MG SL tablet, Place 0.4 mg under the tongue every 5 (five) minutes x 3 doses as needed for chest pain. , Disp: , Rfl:  .  Potassium 95 MG TABS, Take 95 mg by mouth every morning. , Disp: , Rfl:  .  [START ON 09/13/2017] HYDROcodone-acetaminophen (NORCO/VICODIN) 5-325 MG tablet, Take 1 tablet by mouth 2 (two) times daily as needed for severe pain., Disp: 60 tablet, Rfl: 0 .  [START ON 08/14/2017] HYDROcodone-acetaminophen (NORCO/VICODIN) 5-325 MG tablet, Take 1 tablet by mouth 2 (two) times daily as needed for severe pain., Disp: 60 tablet, Rfl: 0  ROS  Constitutional: Denies any fever or chills Gastrointestinal: No reported hemesis, hematochezia, vomiting, or acute GI distress Musculoskeletal: Denies any acute onset joint swelling, redness, loss of ROM, or weakness Neurological: No reported episodes of acute onset apraxia, aphasia, dysarthria, agnosia, amnesia, paralysis, loss of coordination, or loss of consciousness  Allergies  Mr. Fancher has No Known Allergies.  PFSH  Drug: Mr. Shouse  reports that he does not use drugs. Alcohol:  reports that he drinks alcohol. Tobacco:  reports that he has quit smoking. He quit after 25.00 years of use. He has quit using smokeless tobacco. Medical:  has a past medical history of Benign esophageal stricture, Cardiomyopathy, secondary ([place]), Coronary artery disease, Diverticulitis, GI bleed, Gout, Hemorrhoids, Hyperlipidemia, Hypertension, Sleep apnea, and Tubular adenoma of  colon. Surgical: Mr. Mehra  has a past surgical history that includes Coronary angioplasty with stent; cardiac bypass; Cardiac surgery; Coronary artery bypass graft; Colonoscopy with propofol (N/A, 04/29/2015); Esophagogastroduodenoscopy (egd) with propofol (N/A, 04/29/2015); Septoplasty; and Cardiac catheterization (Left, 02/03/2016). Family: family history includes Heart disease in his mother; Hypertension in his father.  Constitutional Exam  General appearance: Well nourished, well developed, and well hydrated. In no apparent acute distress Vitals:   08/06/17 1246  BP: 92/63  Pulse: 71  Resp: 16  SpO2: 94%  Weight: 170 lb (77.1 kg)  Height: '5\' 3"'$  (1.6 m)   BMI Assessment: Estimated body mass index is 30.11 kg/m as calculated from the following:   Height as of this encounter: '5\' 3"'$  (1.6 m).   Weight as of this encounter: 170 lb (77.1 kg). Psych/Mental status: Alert, oriented x 3 (person, place, & time)       Eyes: PERLA Respiratory: No evidence of acute respiratory distress  Cervical Spine Area Exam  Skin & Axial Inspection: No masses, redness, edema, swelling, or associated skin lesions Alignment: Symmetrical Functional ROM: Unrestricted ROM      Stability: No instability detected Muscle Tone/Strength: Functionally intact. No obvious neuro-muscular anomalies detected. Sensory (Neurological): Unimpaired Palpation: No palpable anomalies              Upper Extremity (UE) Exam    Side: Right upper extremity  Side: Left upper extremity  Skin & Extremity Inspection: Skin color, temperature, and hair growth are WNL. No peripheral edema or cyanosis. No masses, redness, swelling, asymmetry, or associated skin lesions. No contractures.  Skin & Extremity Inspection: Skin color, temperature, and hair growth are WNL. No peripheral edema or cyanosis. No masses, redness, swelling, asymmetry, or associated skin lesions. No contractures.  Functional ROM: Unrestricted ROM          Functional ROM:  Unrestricted ROM          Muscle Tone/Strength: Functionally intact. No obvious neuro-muscular anomalies detected.  Muscle Tone/Strength: Functionally intact. No obvious neuro-muscular anomalies detected.  Sensory (Neurological): Unimpaired          Sensory (Neurological): Unimpaired          Palpation: No palpable anomalies              Palpation: No palpable anomalies              Specialized Test(s): Deferred         Specialized Test(s): Deferred          Thoracic Spine Area Exam  Skin & Axial Inspection: No masses, redness, or swelling Alignment: Symmetrical Functional ROM: Unrestricted ROM Stability: No instability detected Muscle Tone/Strength: Functionally intact. No obvious neuro-muscular anomalies detected. Sensory (Neurological): Unimpaired Muscle strength & Tone: No palpable anomalies  Lumbar Spine Area Exam  Skin & Axial Inspection: No masses, redness, or swelling Alignment: Symmetrical Functional ROM: Unrestricted ROM      Stability: No instability detected Muscle Tone/Strength: Functionally intact. No obvious neuro-muscular anomalies detected. Sensory (Neurological): Unimpaired Palpation: No palpable anomalies  Provocative Tests: Lumbar Hyperextension and rotation test: evaluation deferred today       Lumbar Lateral bending test: evaluation deferred today       Patrick's Maneuver: evaluation deferred today                    Gait & Posture Assessment  Ambulation: Unassisted Gait: Relatively normal for age and body habitus Posture: WNL   Lower Extremity Exam    Side: Right lower extremity  Side: Left lower extremity  Skin & Extremity Inspection: Skin color, temperature, and hair growth are WNL. No peripheral edema or cyanosis. No masses, redness, swelling, asymmetry, or associated skin lesions. No contractures.  Skin & Extremity Inspection: Skin color, temperature, and hair growth are WNL. No peripheral edema or cyanosis. No masses, redness, swelling, asymmetry,  or associated skin lesions. No contractures.  Functional ROM: Unrestricted ROM          Functional ROM: Unrestricted ROM          Muscle Tone/Strength: Functionally intact. No obvious neuro-muscular anomalies detected.  Muscle Tone/Strength: Functionally intact. No obvious neuro-muscular anomalies detected.  Sensory (Neurological): Unimpaired  Sensory (Neurological): Unimpaired  Palpation: No palpable anomalies  Palpation: No palpable anomalies   Assessment  Primary Diagnosis & Pertinent Problem List: The primary encounter diagnosis was Chronic lower extremity pain (Location of Secondary source of pain) (Bilateral) (R>L). Diagnoses of Chronic hip pain (Left), Chronic chest wall pain (Location of Primary Source of Pain) (Incisional Midline) (since 04/22/2012), Chronic pain syndrome, Neurogenic pain, Long term current use of opiate analgesic, and Chronic bilateral low back pain with bilateral sciatica were also pertinent to this visit.  Status Diagnosis  Controlled Controlled Controlled 1. Chronic lower extremity pain (Location of Secondary source of pain) (Bilateral) (R>L)   2. Chronic hip pain (Left)   3. Chronic chest wall pain (Location of Primary Source of Pain) (Incisional Midline) (since 04/22/2012)   4. Chronic pain syndrome   5. Neurogenic pain   6. Long term current use of opiate analgesic   7. Chronic bilateral low back pain with bilateral sciatica     Problems updated and reviewed during this visit: No problems updated. Plan of Care  Pharmacotherapy (Medications Ordered): Meds ordered this encounter  Medications  . HYDROcodone-acetaminophen (NORCO/VICODIN) 5-325 MG tablet    Sig: Take 1 tablet by mouth 2 (two) times daily as needed for severe pain.    Dispense:  60 tablet    Refill:  0    Patient may have prescription filled one day early if pharmacy is closed on scheduled refill date. Do not fill until:10/13/2017 To last until:11/12/2017    Order Specific Question:    Supervising Provider    Answer:   Milinda Pointer [phone]  . HYDROcodone-acetaminophen (NORCO/VICODIN) 5-325 MG tablet    Sig: Take 1 tablet by mouth 2 (two) times daily as needed for severe pain.    Dispense:  60 tablet    Refill:  0    Patient may have prescription filled one day early if pharmacy is closed on scheduled refill date. Do not fill until: 09/13/2017 To last until:10/13/2017    Order Specific Question:   Supervising Provider    Answer:   Milinda Pointer [phone]  . HYDROcodone-acetaminophen (NORCO/VICODIN) 5-325 MG tablet    Sig: Take 1 tablet by mouth 2 (two) times daily as needed for severe pain.    Dispense:  60 tablet    Refill:  0    Patient may have prescription  filled one day early if pharmacy is closed on scheduled refill date. Do not fill until: 08/14/2017 To last until:09/13/2017    Order Specific Question:   Supervising Provider    Answer:   Milinda Pointer [phone]  . gabapentin (NEURONTIN) 600 MG tablet    Sig: Take 1 tablet (600 mg total) by mouth 2 (two) times daily.    Dispense:  180 tablet    Refill:  0    Do not add this medication to the electronic "Automatic Refill" notification system. Patient may have prescription filled one day early if pharmacy is closed on scheduled refill date.    Order Specific Question:   Supervising Provider    Answer:   Milinda Pointer [[id number]]  This SmartLink is deprecated. Use AVSMEDLIST instead to display the medication list for a patient. Medications administered today: Margaretha Seeds. "Tim" had no medications administered during this visit. Lab-work, procedure(s), and/or referral(s): No orders of the defined types were placed in this encounter.  Imaging and/or referral(s): None  Interventional therapies: Planned, scheduled, and/or pending:  Continue with current regimen    Considering:  Lumbar facet and sacroiliac joint radiofrequency ablation.   Palliative PRN treatment(s):  Diagnostic  left intra-articular hip joint injection   Provider-requested follow-up: Return in about 3 months (around 11/04/2017) for MedMgmt.  Future Appointments  Date Time Provider [place]  11/05/2017 12:45 PM Vevelyn Francois, NP [facility] None   Primary Care Physician: Elisabeth Cara, NP Location: [facility] Outpatient Pain Management Facility Note by: Vevelyn Francois NP Date: 08/06/2017; Time: 3:27 PM  Pain Score Disclaimer: We use the NRS-11 scale. This is a self-reported, subjective measurement of pain severity with only modest accuracy. It is used primarily to identify changes within a particular patient. It must be understood that outpatient pain scales are significantly less accurate that those used for research, where they can be applied under ideal controlled circumstances with minimal exposure to variables. In reality, the score is likely to be a combination of pain intensity and pain affect, where pain affect describes the degree of emotional arousal or changes in action readiness caused by the sensory experience of pain. Factors such as social and work situation, setting, emotional state, anxiety levels, expectation, and prior pain experience may influence pain perception and show large inter-individual differences that may also be affected by time variables.  Patient instructions provided during this appointment: Patient Instructions    ____________________________________________________________________________________________  Medication Rules  Applies to: All patients receiving prescriptions (written or electronic).  Pharmacy of record: Pharmacy where electronic prescriptions will be sent. If written prescriptions are taken to a different pharmacy, please inform the nursing staff. The pharmacy listed in the electronic medical record should be the one where you would like electronic prescriptions to be sent.  Prescription refills: Only during scheduled appointments. Applies to both,  written and electronic prescriptions.  NOTE: The following applies primarily to controlled substances (Opioid* Pain Medications).   Patient's responsibilities: 1. Pain Pills: Bring all pain pills to every appointment (except for procedure appointments). 2. Pill Bottles: Bring pills in original pharmacy bottle. Always bring newest bottle. Bring bottle, even if empty. 3. Medication refills: You are responsible for knowing and keeping track of what medications you need refilled. The day before your appointment, write a list of all prescriptions that need to be refilled. Bring that list to your appointment and give it to the admitting nurse. Prescriptions will be written only during appointments. If you forget a medication, it will  not be "Called in", "Faxed", or "electronically sent". You will need to get another appointment to get these prescribed. 4. Prescription Accuracy: You are responsible for carefully inspecting your prescriptions before leaving our office. Have the discharge nurse carefully go over each prescription with you, before taking them home. Make sure that your name is accurately spelled, that your address is correct. Check the name and dose of your medication to make sure it is accurate. Check the number of pills, and the written instructions to make sure they are clear and accurate. Make sure that you are given enough medication to last until your next medication refill appointment. 5. Taking Medication: Take medication as prescribed. Never take more pills than instructed. Never take medication more frequently than prescribed. Taking less pills or less frequently is permitted and encouraged, when it comes to controlled substances (written prescriptions).  6. Inform other Doctors: Always inform, all of your healthcare providers, of all the medications you take. 7. Pain Medication from other Providers: You are not allowed to accept any additional pain medication from any other Doctor or  Healthcare provider. There are two exceptions to this rule. (see below) In the event that you require additional pain medication, you are responsible for notifying us, as stated below. 8. Medication Agreement: You are responsible for carefully reading and following our Medication Agreement. This must be signed before receiving any prescriptions from our practice. Safely store a copy of your signed Agreement. Violations to the Agreement will result in no further prescriptions. (Additional copies of our Medication Agreement are available upon request.) 9. Laws, Rules, & Regulations: All patients are expected to follow all Federal and [employer], [employer], Rules, [employer] [facility]. Ignorance of the Laws does not constitute a valid excuse. The use of any illegal substances is prohibited. 10. Adopted CDC guidelines & recommendations: Target dosing levels will be at or below 60 MME/day. Use of benzodiazepines** is not recommended.  Exceptions: There are only two exceptions to the rule of not receiving pain medications from other Healthcare Providers. 1. Exception #1 (Emergencies): In the event of an emergency (i.e.: accident requiring emergency care), you are allowed to receive additional pain medication. However, you are responsible for: As soon as you are able, call our office [phone], at any time of the day or night, and leave a message stating your name, the date and nature of the emergency, and the name and dose of the medication prescribed. In the event that your call is answered by a member of our staff, make sure to document and save the date, time, and the name of the person that took your information.  2. Exception #2 (Planned Surgery): In the event that you are scheduled by another doctor or dentist to have any type of surgery or procedure, you are allowed (for a period no longer than 30 days), to receive additional pain medication, for the acute post-op pain. However, in this case, you are  responsible for picking up a copy of our "Post-op Pain Management for Surgeons" handout, and giving it to your surgeon or dentist. This document is available at our office, and does not require an appointment to obtain it. Simply go to our office during business hours (Monday-Thursday from 8:00 AM to 4:00 PM) (Friday 8:00 AM to 12:00 Noon) or if you have a scheduled appointment with [place], prior to your surgery, and ask for it by name. In addition, you will need to provide [place] with your name, name of your surgeon, type of  surgery, and date of procedure or surgery.  *Opioid medications include: morphine, codeine, oxycodone, oxymorphone, hydrocodone, hydromorphone, meperidine, tramadol, tapentadol, buprenorphine, fentanyl, methadone. **Benzodiazepine medications include: diazepam (Valium), alprazolam (Xanax), clonazepam (Klonopine), lorazepam (Ativan), clorazepate (Tranxene), chlordiazepoxide (Librium), estazolam (Prosom), oxazepam (Serax), temazepam (Restoril), triazolam (Halcion)  ____________________________________________________________________________________________   BMI Assessment: Estimated body mass index is 30.11 kg/m as calculated from the following:   Height as of this encounter: '5\' 3"'$  (1.6 m).   Weight as of this encounter: 170 lb (77.1 kg).  BMI interpretation table: BMI level Category Range association with higher incidence of chronic pain  <18 kg/m2 Underweight   18.5-24.9 kg/m2 Ideal body weight   25-29.9 kg/m2 Overweight Increased incidence by 20%  30-34.9 kg/m2 Obese (Class I) Increased incidence by 68%  35-39.9 kg/m2 Severe obesity (Class II) Increased incidence by 136%  >40 kg/m2 Extreme obesity (Class III) Increased incidence by 254%   BMI Readings from Last 4 Encounters:  08/06/17 30.11 kg/m  06/16/17 30.11 kg/m  05/07/17 30.11 kg/m  02/05/17 30.11 kg/m   Wt Readings from Last 4 Encounters:  08/06/17 170 lb (77.1 kg)  06/16/17 170 lb (77.1 kg)  05/07/17 170 lb  (77.1 kg)  02/05/17 170 lb (77.1 kg)

## 2017-09-16 DIAGNOSIS — Z0000 Encounter for general adult medical examination without abnormal findings: Secondary | ICD-10-CM | POA: Insufficient documentation

## 2017-10-08 DIAGNOSIS — E1165 Type 2 diabetes mellitus with hyperglycemia: Secondary | ICD-10-CM | POA: Diagnosis present

## 2017-11-05 ENCOUNTER — Other Ambulatory Visit: Payer: Self-pay

## 2017-11-05 ENCOUNTER — Ambulatory Visit: Payer: Medicare Other | Attending: Nurse Practitioner | Admitting: Nurse Practitioner

## 2017-11-05 ENCOUNTER — Encounter: Payer: Self-pay | Admitting: Nurse Practitioner

## 2017-11-05 VITALS — BP 115/65 | HR 78 | Temp 97.8°F | Resp 18 | Ht 63.0 in | Wt 156.0 lb

## 2017-11-05 DIAGNOSIS — G894 Chronic pain syndrome: Secondary | ICD-10-CM

## 2017-11-05 DIAGNOSIS — Z5181 Encounter for therapeutic drug level monitoring: Secondary | ICD-10-CM | POA: Diagnosis not present

## 2017-11-05 DIAGNOSIS — M109 Gout, unspecified: Secondary | ICD-10-CM | POA: Diagnosis not present

## 2017-11-05 DIAGNOSIS — J449 Chronic obstructive pulmonary disease, unspecified: Secondary | ICD-10-CM | POA: Insufficient documentation

## 2017-11-05 DIAGNOSIS — M25511 Pain in right shoulder: Secondary | ICD-10-CM | POA: Insufficient documentation

## 2017-11-05 DIAGNOSIS — G4733 Obstructive sleep apnea (adult) (pediatric): Secondary | ICD-10-CM | POA: Insufficient documentation

## 2017-11-05 DIAGNOSIS — Z79899 Other long term (current) drug therapy: Secondary | ICD-10-CM | POA: Insufficient documentation

## 2017-11-05 DIAGNOSIS — Z79891 Long term (current) use of opiate analgesic: Secondary | ICD-10-CM

## 2017-11-05 DIAGNOSIS — E785 Hyperlipidemia, unspecified: Secondary | ICD-10-CM | POA: Insufficient documentation

## 2017-11-05 DIAGNOSIS — M545 Low back pain: Secondary | ICD-10-CM | POA: Diagnosis not present

## 2017-11-05 DIAGNOSIS — M533 Sacrococcygeal disorders, not elsewhere classified: Secondary | ICD-10-CM | POA: Diagnosis not present

## 2017-11-05 DIAGNOSIS — M25512 Pain in left shoulder: Secondary | ICD-10-CM | POA: Diagnosis not present

## 2017-11-05 DIAGNOSIS — K222 Esophageal obstruction: Secondary | ICD-10-CM | POA: Insufficient documentation

## 2017-11-05 DIAGNOSIS — E876 Hypokalemia: Secondary | ICD-10-CM | POA: Insufficient documentation

## 2017-11-05 DIAGNOSIS — M79605 Pain in left leg: Secondary | ICD-10-CM | POA: Insufficient documentation

## 2017-11-05 DIAGNOSIS — Z951 Presence of aortocoronary bypass graft: Secondary | ICD-10-CM | POA: Insufficient documentation

## 2017-11-05 DIAGNOSIS — M25552 Pain in left hip: Secondary | ICD-10-CM | POA: Insufficient documentation

## 2017-11-05 DIAGNOSIS — E559 Vitamin D deficiency, unspecified: Secondary | ICD-10-CM | POA: Insufficient documentation

## 2017-11-05 DIAGNOSIS — M792 Neuralgia and neuritis, unspecified: Secondary | ICD-10-CM | POA: Diagnosis not present

## 2017-11-05 DIAGNOSIS — M79604 Pain in right leg: Secondary | ICD-10-CM | POA: Insufficient documentation

## 2017-11-05 DIAGNOSIS — I252 Old myocardial infarction: Secondary | ICD-10-CM | POA: Diagnosis not present

## 2017-11-05 DIAGNOSIS — I10 Essential (primary) hypertension: Secondary | ICD-10-CM | POA: Insufficient documentation

## 2017-11-05 DIAGNOSIS — I25110 Atherosclerotic heart disease of native coronary artery with unstable angina pectoris: Secondary | ICD-10-CM | POA: Diagnosis not present

## 2017-11-05 DIAGNOSIS — G8929 Other chronic pain: Secondary | ICD-10-CM

## 2017-11-05 DIAGNOSIS — I429 Cardiomyopathy, unspecified: Secondary | ICD-10-CM | POA: Diagnosis not present

## 2017-11-05 DIAGNOSIS — M47896 Other spondylosis, lumbar region: Secondary | ICD-10-CM | POA: Diagnosis not present

## 2017-11-05 DIAGNOSIS — M940 Chondrocostal junction syndrome [Tietze]: Secondary | ICD-10-CM | POA: Diagnosis not present

## 2017-11-05 DIAGNOSIS — R079 Chest pain, unspecified: Secondary | ICD-10-CM | POA: Diagnosis not present

## 2017-11-05 DIAGNOSIS — Z794 Long term (current) use of insulin: Secondary | ICD-10-CM | POA: Insufficient documentation

## 2017-11-05 DIAGNOSIS — Z7982 Long term (current) use of aspirin: Secondary | ICD-10-CM | POA: Insufficient documentation

## 2017-11-05 DIAGNOSIS — M47816 Spondylosis without myelopathy or radiculopathy, lumbar region: Secondary | ICD-10-CM

## 2017-11-05 DIAGNOSIS — Z87891 Personal history of nicotine dependence: Secondary | ICD-10-CM | POA: Insufficient documentation

## 2017-11-05 MED ORDER — HYDROCODONE-ACETAMINOPHEN 5-325 MG PO TABS: 1.0000 | ORAL_TABLET | Freq: Two times a day (BID) | ORAL | 0 refills | Status: DC | PRN

## 2017-11-05 MED ORDER — GABAPENTIN 600 MG PO TABS: 600.0000 mg | ORAL_TABLET | Freq: Two times a day (BID) | ORAL | 0 refills | Status: DC

## 2017-11-05 NOTE — Progress Notes (Signed)
Patient's Name: [name] T Roscher Jr.  MRN: [id number]  Referring Provider: Spencer, Nicole, NP  DOB: 05/29/1956  PCP: Spencer, Nicole, NP  DOS: 11/05/2017  Note by: Crystal M. King NP  Service setting: Ambulatory outpatient  Specialty: Interventional Pain Management  Location: [facility] (AMB) Pain Management Facility    Patient type: Established    Primary Reason(s) for Visit: Encounter for prescription drug management. (Level of risk: moderate)  CC: Chest Pain; Back Pain (lower); Shoulder Pain (bilateral); and Leg Pain (bilateral, lower)  HPI  [name] [name] is a 61 y.o. year old, male patient, who comes today for a medication management evaluation. He has Apnea, sleep; H/O coronary artery bypass surgery; OSA (obstructive sleep apnea); Hypertension; HLD (hyperlipidemia); Coronary artery disease; Chronic obstructive pulmonary disease (HCC); Alcohol withdrawal syndrome (HCC); Injury of kidney; Chronic low back pain (Bilateral) (R>L); Chronic shoulder pain (Location of Tertiary source of pain) (Right); Chronic hip pain (Left); History of alcoholism (HCC); Opiate use; Encounter for therapeutic drug level monitoring; Chronic chest wall pain (Location of Primary Source of Pain) (Incisional Midline) (since 04/22/2012); Chronic lower extremity pain (Location of Secondary source of pain) (Bilateral) (R>L); History of MI (myocardial infarction) (January 2014); Encounter for pain management planning; Chronic sacroiliac joint pain (Bilateral) (L>R); Neuropathic pain; Neurogenic pain; Incisional pain; Elevated sedimentation rate; Vitamin D insufficiency; Lumbar facet syndrome (Bilateral) (R>L); Lumbar spondylosis; Long term current use of opiate analgesic; Long term prescription opiate use; Keloid skin disorder; Costochondritis; Unstable angina (HCC); Chest pain; Brachial plexus injury, right; Carotid atherosclerosis; Hypoalbuminemia; Hypokalemia; Right sided weakness; S/P CABG (coronary artery bypass graft); Opioid-induced  constipation (OIC); Pneumonia; Pressure injury of skin; COPD (chronic obstructive pulmonary disease) (HCC); and Chronic pain syndrome on their problem list. His primarily concern today is the Chest Pain; Back Pain (lower); Shoulder Pain (bilateral); and Leg Pain (bilateral, lower)  Pain Assessment: Location: Lower Back Radiating: denies Onset: More than a month ago Duration: Chronic pain Quality: Constant Severity: 4 /10 (self-reported pain score)  Note: Reported level is compatible with observation.                          Timing: Constant Modifying factors: rest  [name] [name] was last scheduled for an appointment on 08/06/2017 for medication management. During today's appointment we reviewed [name] [name]'s chronic pain status, as well as his outpatient medication regimen. He admits that his pain is stable.   The patient  reports that he does not use drugs. His body mass index is 27.63 kg/m.  Further details on both, my assessment(s), as well as the proposed treatment plan, please see below.  Controlled Substance Pharmacotherapy Assessment REMS (Risk Evaluation and Mitigation Strategy)  Analgesic:Hydrocodone/APAP 5/325 one tablet twice a day (10 mg per day) MME/day:10 mg/day.     Wheatley, Dena L, RN  11/05/2017 12:55 PM  Sign at close encounter Nursing Pain Medication Assessment:  Safety precautions to be maintained throughout the outpatient stay will include: orient to surroundings, keep bed in low position, maintain call bell within reach at all times, provide assistance with transfer out of bed and ambulation.  Medication Inspection Compliance: Pill count conducted under aseptic conditions, in front of the patient. Neither the pills nor the bottle was removed from the patient's sight at any time. Once count was completed pills were immediately returned to the patient in their original bottle.  Medication: Hydrocodone/APAP Pill/Patch Count: 15 of 60 pills remain Pill/Patch  Appearance: Markings consistent with prescribed medication Bottle Appearance: Standard   pharmacy container. Clearly labeled. Filled Date: 02/10 / 2019 Last Medication intake:  Today   Pharmacokinetics: Liberation and absorption (onset of action): WNL Distribution (time to peak effect): WNL Metabolism and excretion (duration of action): WNL         Pharmacodynamics: Desired effects: Analgesia: [name] [name] reports >50% benefit. Functional ability: Patient reports that medication allows him to accomplish basic ADLs Clinically meaningful improvement in function (CMIF): Sustained CMIF goals met Perceived effectiveness: Described as relatively effective, allowing for increase in activities of daily living (ADL) Undesirable effects: Side-effects or Adverse reactions: None reported Monitoring: [place] PMP: Online review of the past 12-month period conducted. Compliant with practice rules and regulations Last UDS on record: Summary  Date Value Ref Range Status  02/05/2017 FINAL  Final    Comment:    ==================================================================== TOXASSURE SELECT 13 (MW) ==================================================================== Test                             Result       Flag       Units Drug Present and Declared for Prescription Verification   Hydrocodone                    333          EXPECTED   ng/mg creat   Hydromorphone                  155          EXPECTED   ng/mg creat   Dihydrocodeine                 32           EXPECTED   ng/mg creat   Norhydrocodone                 299          EXPECTED   ng/mg creat    Sources of hydrocodone include scheduled prescription    medications. Hydromorphone, dihydrocodeine and norhydrocodone are    expected metabolites of hydrocodone. Hydromorphone and    dihydrocodeine are also available as scheduled prescription    medications. ==================================================================== Test                       Result    Flag   Units      Ref Range   Creatinine              199              mg/dL      >=20 ==================================================================== Declared Medications:  The flagging and interpretation on this report are based on the  following declared medications.  Unexpected results may arise from  inaccuracies in the declared medications.  **Note: The testing scope of this panel includes these medications:  Hydrocodone (Hydrocodone-Acetaminophen)  **Note: The testing scope of this panel does not include following  reported medications:  Acetaminophen (Hydrocodone-Acetaminophen)  Allopurinol  Amlodipine  Aspirin (Aspirin 81)  Atorvastatin  Gabapentin (Neurontin)  Hydrochlorothiazide (Hydrodiuril)  Isosorbide (Imdur)  Losartan (Cozaar)  Metoprolol  Multivitamin  Nitroglycerin  Potassium  Vitamin D3 ==================================================================== For clinical consultation, please call [phone]. ====================================================================    UDS interpretation: Compliant          Medication Assessment Form: Reviewed. Patient indicates being compliant with therapy Treatment compliance: Compliant Risk Assessment Profile: Aberrant behavior: See prior evaluations. None observed or detected today   Comorbid factors increasing risk of overdose: See prior notes. No additional risks detected today Risk of substance use disorder (SUD): Low  ORT Scoring interpretation table:  Score <3 = Low Risk for SUD  Score between 4-7 = Moderate Risk for SUD  Score >8 = High Risk for Opioid Abuse   Risk Mitigation Strategies:  Patient Counseling: Covered Patient-Prescriber Agreement (PPA): Present and active  Notification to other healthcare providers: Done  Pharmacologic Plan: No change in therapy, at this time.             Laboratory Chemistry  Inflammation Markers (CRP: Acute Phase) (ESR: Chronic Phase) Lab  Results  Component Value Date   CRP <0.5 10/26/2015   ESRSEDRATE 32 (H) 10/26/2015   LATICACIDVEN 1.4 05/28/2016                         Rheumatology Markers No results found for: RF, ANA, LABURIC, URICUR, LYMEIGGIGMAB, LYMEABIGMQN              Renal Function Markers Lab Results  Component Value Date   BUN 24 (H) 06/16/2017   CREATININE 1.02 06/16/2017   GFRAA >60 06/16/2017   GFRNONAA >60 06/16/2017                 Hepatic Function Markers Lab Results  Component Value Date   AST 63 (H) 12/04/2016   ALT 55 12/04/2016   ALBUMIN 4.7 12/04/2016   ALKPHOS 62 12/04/2016   LIPASE 29 03/19/2015                 Electrolytes Lab Results  Component Value Date   NA 136 06/16/2017   K 3.2 (L) 06/16/2017   CL 97 (L) 06/16/2017   CALCIUM 9.2 06/16/2017   MG 2.0 10/26/2015                        Neuropathy Markers Lab Results  Component Value Date   HGBA1C 6.1 04/17/2012                 Bone Pathology Markers Lab Results  Component Value Date   VD25OH 26.5 (L) 10/26/2015   VD125OH2TOT 30.8 10/26/2015                         Coagulation Parameters Lab Results  Component Value Date   INR 0.97 02/09/2015   LABPROT 13.1 02/09/2015   APTT > 160.0 (HH) 04/21/2012   PLT 192 06/16/2017                 Cardiovascular Markers Lab Results  Component Value Date   BNP 255 (H) 08/01/2014   CKTOTAL 82 05/16/2013   CKMB 1.4 05/16/2013   TROPONINI <0.03 06/16/2017   HGB 13.9 06/16/2017   HCT 39.6 (L) 06/16/2017                 CA Markers No results found for: CEA, CA125, LABCA2               Note: Lab results reviewed.  Recent Diagnostic Imaging Results  DG Chest 2 View CLINICAL DATA:  Acute onset of generalized chest pain. High blood pressure. Initial encounter.  EXAM: CHEST  2 VIEW  COMPARISON:  Chest radiograph performed 09/12/2016  FINDINGS: The lungs are well-aerated. Pulmonary vascularity is at the upper limits of normal. There is no evidence of focal  opacification, pleural effusion or pneumothorax.  The heart is borderline normal in size. The patient is status post median sternotomy, with evidence of prior CABG. No acute osseous abnormalities are seen. Clips are noted at the right axilla.  IMPRESSION: No acute cardiopulmonary process seen.  Electronically Signed   By: Jeffery  Chang M.D.   On: 06/16/2017 03:11  Complexity Note: Imaging results reviewed. Results shared with [name] [name], using Layman's terms.                         Meds   Current Outpatient Medications:  .  allopurinol (ZYLOPRIM) 300 MG tablet, Take 100 mg by mouth daily. *patient takes in the afternoon*, Disp: , Rfl:  .  amLODipine (NORVASC) 10 MG tablet, Take 5 mg by mouth daily. , Disp: , Rfl:  .  aspirin EC 81 MG tablet, Take 81 mg by mouth every morning., Disp: , Rfl:  .  atorvastatin (LIPITOR) 40 MG tablet, Take 40 mg by mouth at bedtime. , Disp: , Rfl:  .  Cholecalciferol (VITAMIN D3) 2000 units TABS, Take 2,000 Units by mouth every morning., Disp: , Rfl:  .  hydrochlorothiazide (HYDRODIURIL) 25 MG tablet, Take 25 mg by mouth daily. , Disp: , Rfl:  .  Insulin Detemir (LEVEMIR FLEXTOUCH) 100 UNIT/ML Pen, Inject 20 Units into the skin 2 (two) times daily., Disp: , Rfl:  .  isosorbide mononitrate (IMDUR) 30 MG 24 hr tablet, Take 30 mg by mouth every morning. , Disp: , Rfl: 1 .  linagliptin (TRADJENTA) 5 MG TABS tablet, Take 5 mg by mouth daily., Disp: , Rfl:  .  losartan (COZAAR) 100 MG tablet, take 1 tablet by mouth once daily, Disp: , Rfl:  .  metoprolol (LOPRESSOR) 50 MG tablet, Take 50 mg by mouth 3 (three) times daily., Disp: , Rfl:  .  Multiple Vitamin (MULTIVITAMIN WITH MINERALS) TABS tablet, Take 1 tablet by mouth every evening., Disp: , Rfl:  .  nitroGLYCERIN (NITROSTAT) 0.4 MG SL tablet, Place 0.4 mg under the tongue every 5 (five) minutes x 3 doses as needed for chest pain. , Disp: , Rfl:  .  Potassium 95 MG TABS, Take 95 mg by mouth every morning.  , Disp: , Rfl:  .  [START ON 11/12/2017] gabapentin (NEURONTIN) 600 MG tablet, Take 1 tablet (600 mg total) by mouth 2 (two) times daily., Disp: 180 tablet, Rfl: 0 .  [START ON 01/11/2018] HYDROcodone-acetaminophen (NORCO/VICODIN) 5-325 MG tablet, Take 1 tablet by mouth 2 (two) times daily as needed for severe pain., Disp: 60 tablet, Rfl: 0 .  [START ON 12/12/2017] HYDROcodone-acetaminophen (NORCO/VICODIN) 5-325 MG tablet, Take 1 tablet by mouth 2 (two) times daily as needed for severe pain., Disp: 60 tablet, Rfl: 0 .  [START ON 11/12/2017] HYDROcodone-acetaminophen (NORCO/VICODIN) 5-325 MG tablet, Take 1 tablet by mouth 2 (two) times daily as needed for severe pain., Disp: 60 tablet, Rfl: 0  ROS  Constitutional: Denies any fever or chills Gastrointestinal: No reported hemesis, hematochezia, vomiting, or acute GI distress Musculoskeletal: Denies any acute onset joint swelling, redness, loss of ROM, or weakness Neurological: No reported episodes of acute onset apraxia, aphasia, dysarthria, agnosia, amnesia, paralysis, loss of coordination, or loss of consciousness  Allergies  [name] [name] has No Known Allergies.  PFSH  Drug: [name] [name]  reports that he does not use drugs. Alcohol:  reports that he drinks alcohol. Tobacco:  reports that he has quit smoking. He quit after 25.00 years of use. He has quit using smokeless   tobacco. Medical:  has a past medical history of Benign esophageal stricture, Cardiomyopathy, secondary (HCC), Coronary artery disease, Diverticulitis, GI bleed, Gout, Hemorrhoids, Hyperlipidemia, Hypertension, Sleep apnea, and Tubular adenoma of colon. Surgical: [name] [name]  has a past surgical history that includes Coronary angioplasty with stent; cardiac bypass; Cardiac surgery; Coronary artery bypass graft; Colonoscopy with propofol (N/A, 04/29/2015); Esophagogastroduodenoscopy (egd) with propofol (N/A, 04/29/2015); Septoplasty; and Cardiac catheterization (Left, 02/03/2016). Family:  family history includes Heart disease in his mother; Hypertension in his father.  Constitutional Exam  General appearance: Well nourished, well developed, and well hydrated. In no apparent acute distress Vitals:   11/05/17 1246  BP: 115/65  Pulse: 78  Resp: 18  Temp: 97.8 F (36.6 C)  TempSrc: Oral  SpO2: 100%  Weight: 156 lb (70.8 kg)  Height: 5' 3" (1.6 m)   BMI Assessment: Estimated body mass index is 27.63 kg/m as calculated from the following:   Height as of this encounter: 5' 3" (1.6 m).   Weight as of this encounter: 156 lb (70.8 kg). Psych/Mental status: Alert, oriented x 3 (person, place, & time)       Eyes: PERLA Respiratory: No evidence of acute respiratory distress  Cervical Spine Area Exam  Skin & Axial Inspection: No masses, redness, edema, swelling, or associated skin lesions Alignment: Symmetrical Functional ROM: Unrestricted ROM      Stability: No instability detected Muscle Tone/Strength: Functionally intact. No obvious neuro-muscular anomalies detected. Sensory (Neurological): Unimpaired Palpation: No palpable anomalies              Upper Extremity (UE) Exam    Side: Right upper extremity  Side: Left upper extremity  Skin & Extremity Inspection: Skin color, temperature, and hair growth are WNL. No peripheral edema or cyanosis. No masses, redness, swelling, asymmetry, or associated skin lesions. No contractures.  Skin & Extremity Inspection: Skin color, temperature, and hair growth are WNL. No peripheral edema or cyanosis. No masses, redness, swelling, asymmetry, or associated skin lesions. No contractures.  Functional ROM: Unrestricted ROM          Functional ROM: Unrestricted ROM          Muscle Tone/Strength: Functionally intact. No obvious neuro-muscular anomalies detected.  Muscle Tone/Strength: Functionally intact. No obvious neuro-muscular anomalies detected.  Sensory (Neurological): Unimpaired          Sensory (Neurological): Unimpaired           Palpation: No palpable anomalies              Palpation: No palpable anomalies              Specialized Test(s): Deferred         Specialized Test(s): Deferred          Thoracic Spine Area Exam  Skin & Axial Inspection: No masses, redness, or swelling Alignment: Symmetrical Functional ROM: Unrestricted ROM Stability: No instability detected Muscle Tone/Strength: Functionally intact. No obvious neuro-muscular anomalies detected. Sensory (Neurological): Unimpaired Muscle strength & Tone: No palpable anomalies  Lumbar Spine Area Exam  Skin & Axial Inspection: No masses, redness, or swelling Alignment: Symmetrical Functional ROM: Unrestricted ROM      Stability: No instability detected Muscle Tone/Strength: Functionally intact. No obvious neuro-muscular anomalies detected. Sensory (Neurological): Unimpaired Palpation: Complains of area being tender to palpation       Provocative Tests: Lumbar Hyperextension and rotation test: evaluation deferred today       Lumbar Lateral bending test: evaluation deferred today       Patrick's   Maneuver: evaluation deferred today                    Gait & Posture Assessment  Ambulation: Unassisted Gait: Relatively normal for age and body habitus Posture: WNL   Lower Extremity Exam    Side: Right lower extremity  Side: Left lower extremity  Skin & Extremity Inspection: Skin color, temperature, and hair growth are WNL. No peripheral edema or cyanosis. No masses, redness, swelling, asymmetry, or associated skin lesions. No contractures.  Skin & Extremity Inspection: Skin color, temperature, and hair growth are WNL. No peripheral edema or cyanosis. No masses, redness, swelling, asymmetry, or associated skin lesions. No contractures.  Functional ROM: Unrestricted ROM          Functional ROM: Unrestricted ROM          Muscle Tone/Strength: Functionally intact. No obvious neuro-muscular anomalies detected.  Muscle Tone/Strength: Functionally intact. No  obvious neuro-muscular anomalies detected.  Sensory (Neurological): Unimpaired  Sensory (Neurological): Unimpaired  Palpation: No palpable anomalies  Palpation: No palpable anomalies   Assessment  Primary Diagnosis & Pertinent Problem List: The primary encounter diagnosis was Lumbar spondylosis. Diagnoses of Lumbar facet syndrome (Bilateral) (R>L), Chronic sacroiliac joint pain (Bilateral) (L>R), Neurogenic pain, Chronic pain syndrome, and Long term current use of opiate analgesic were also pertinent to this visit.  Status Diagnosis  Controlled Controlled Controlled 1. Lumbar spondylosis   2. Lumbar facet syndrome (Bilateral) (R>L)   3. Chronic sacroiliac joint pain (Bilateral) (L>R)   4. Neurogenic pain   5. Chronic pain syndrome   6. Long term current use of opiate analgesic     Problems updated and reviewed during this visit: No problems updated. Plan of Care  Pharmacotherapy (Medications Ordered): Meds ordered this encounter  Medications  . HYDROcodone-acetaminophen (NORCO/VICODIN) 5-325 MG tablet    Sig: Take 1 tablet by mouth 2 (two) times daily as needed for severe pain.    Dispense:  60 tablet    Refill:  0    Patient may have prescription filled one day early if pharmacy is closed on scheduled refill date. Do not fill until:01/11/2018 To last until:02/10/2018    Order Specific Question:   Supervising Provider    Answer:   NAVEIRA, FRANCISCO [[id number]]  . HYDROcodone-acetaminophen (NORCO/VICODIN) 5-325 MG tablet    Sig: Take 1 tablet by mouth 2 (two) times daily as needed for severe pain.    Dispense:  60 tablet    Refill:  0    Patient may have prescription filled one day early if pharmacy is closed on scheduled refill date. Do not fill until: 12/12/2017 To last until:01/11/2018    Order Specific Question:   Supervising Provider    Answer:   NAVEIRA, FRANCISCO [[id number]]  . HYDROcodone-acetaminophen (NORCO/VICODIN) 5-325 MG tablet    Sig: Take 1 tablet by mouth 2 (two) times  daily as needed for severe pain.    Dispense:  60 tablet    Refill:  0    Patient may have prescription filled one day early if pharmacy is closed on scheduled refill date. Do not fill until: 11/12/2017 To last until:12/12/2017    Order Specific Question:   Supervising Provider    Answer:   NAVEIRA, FRANCISCO [[id number]]  . gabapentin (NEURONTIN) 600 MG tablet    Sig: Take 1 tablet (600 mg total) by mouth 2 (two) times daily.    Dispense:  180 tablet    Refill:  0    Do not add this medication   to the electronic "Automatic Refill" notification system. Patient may have prescription filled one day early if pharmacy is closed on scheduled refill date.    Order Specific Question:   Supervising Provider    Answer:   NAVEIRA, FRANCISCO [[id number]]   New Prescriptions   No medications on file   Medications administered today: [name] T. Foutz Jr. "Tim" had no medications administered during this visit. Lab-work, procedure(s), and/or referral(s): Orders Placed This Encounter  Procedures  . ToxASSURE Select 13 (MW), Urine   Imaging and/or referral(s): None  Interventional therapies: Planned, scheduled, and/or pending:   None at this time.    Considering:   Lumbar facet and sacroiliac joint radiofrequency ablation.   Palliative PRN treatment(s):   Diagnostic left intra-articular hip joint injection under fluoroscopic guidance, with or without sedation.    Provider-requested follow-up: Return in about 3 months (around 02/05/2018) for MedMgmt with Me (Crystal King).  No future appointments. Primary Care Physician: Spencer, Nicole, NP Location: [facility] Note by: Crystal M. King NP Date: 11/05/2017; Time: 1:17 PM  Pain Score Disclaimer: We use the NRS-11 scale. This is a self-reported, subjective measurement of pain severity with only modest accuracy. It is used primarily to identify changes within a particular patient. It must be understood that outpatient pain  scales are significantly less accurate that those used for research, where they can be applied under ideal controlled circumstances with minimal exposure to variables. In reality, the score is likely to be a combination of pain intensity and pain affect, where pain affect describes the degree of emotional arousal or changes in action readiness caused by the sensory experience of pain. Factors such as social and work situation, setting, emotional state, anxiety levels, expectation, and prior pain experience may influence pain perception and show large inter-individual differences that may also be affected by time variables.  Patient instructions provided during this appointment: Patient Instructions  ____________________________________________________________________________________________  Medication Rules  Applies to: All patients receiving prescriptions (written or electronic).  Pharmacy of record: Pharmacy where electronic prescriptions will be sent. If written prescriptions are taken to a different pharmacy, please inform the nursing staff. The pharmacy listed in the electronic medical record should be the one where you would like electronic prescriptions to be sent.  Prescription refills: Only during scheduled appointments. Applies to both, written and electronic prescriptions.  NOTE: The following applies primarily to controlled substances (Opioid* Pain Medications).   Patient's responsibilities: 1. Pain Pills: Bring all pain pills to every appointment (except for procedure appointments). 2. Pill Bottles: Bring pills in original pharmacy bottle. Always bring newest bottle. Bring bottle, even if empty. 3. Medication refills: You are responsible for knowing and keeping track of what medications you need refilled. The day before your appointment, write a list of all prescriptions that need to be refilled. Bring that list to your appointment and give it to the admitting nurse. Prescriptions  will be written only during appointments. If you forget a medication, it will not be "Called in", "Faxed", or "electronically sent". You will need to get another appointment to get these prescribed. 4. Prescription Accuracy: You are responsible for carefully inspecting your prescriptions before leaving our office. Have the discharge nurse carefully go over each prescription with you, before taking them home. Make sure that your name is accurately spelled, that your address is correct. Check the name and dose of your medication to make sure it is accurate. Check the number of pills, and the written instructions to make sure   they are clear and accurate. Make sure that you are given enough medication to last until your next medication refill appointment. 5. Taking Medication: Take medication as prescribed. Never take more pills than instructed. Never take medication more frequently than prescribed. Taking less pills or less frequently is permitted and encouraged, when it comes to controlled substances (written prescriptions).  6. Inform other Doctors: Always inform, all of your healthcare providers, of all the medications you take. 7. Pain Medication from other Providers: You are not allowed to accept any additional pain medication from any other Doctor or Healthcare provider. There are two exceptions to this rule. (see below) In the event that you require additional pain medication, you are responsible for notifying us, as stated below. 8. Medication Agreement: You are responsible for carefully reading and following our Medication Agreement. This must be signed before receiving any prescriptions from our practice. Safely store a copy of your signed Agreement. Violations to the Agreement will result in no further prescriptions. (Additional copies of our Medication Agreement are available upon request.) 9. Laws, Rules, & Regulations: All patients are expected to follow all Federal and State Laws, Statutes, Rules,  & Regulations. Ignorance of the Laws does not constitute a valid excuse. The use of any illegal substances is prohibited. 10. Adopted CDC guidelines & recommendations: Target dosing levels will be at or below 60 MME/day. Use of benzodiazepines** is not recommended.  Exceptions: There are only two exceptions to the rule of not receiving pain medications from other Healthcare Providers. 1. Exception #1 (Emergencies): In the event of an emergency (i.e.: accident requiring emergency care), you are allowed to receive additional pain medication. However, you are responsible for: As soon as you are able, call our office [phone], at any time of the day or night, and leave a message stating your name, the date and nature of the emergency, and the name and dose of the medication prescribed. In the event that your call is answered by a member of our staff, make sure to document and save the date, time, and the name of the person that took your information.  2. Exception #2 (Planned Surgery): In the event that you are scheduled by another doctor or dentist to have any type of surgery or procedure, you are allowed (for a period no longer than 30 days), to receive additional pain medication, for the acute post-op pain. However, in this case, you are responsible for picking up a copy of our "Post-op Pain Management for Surgeons" handout, and giving it to your surgeon or dentist. This document is available at our office, and does not require an appointment to obtain it. Simply go to our office during business hours (Monday-Thursday from 8:00 AM to 4:00 PM) (Friday 8:00 AM to 12:00 Noon) or if you have a scheduled appointment with us, prior to your surgery, and ask for it by name. In addition, you will need to provide us with your name, name of your surgeon, type of surgery, and date of procedure or surgery.  *Opioid medications include: morphine, codeine, oxycodone, oxymorphone, hydrocodone, hydromorphone, meperidine,  tramadol, tapentadol, buprenorphine, fentanyl, methadone. **Benzodiazepine medications include: diazepam (Valium), alprazolam (Xanax), clonazepam (Klonopine), lorazepam (Ativan), clorazepate (Tranxene), chlordiazepoxide (Librium), estazolam (Prosom), oxazepam (Serax), temazepam (Restoril), triazolam (Halcion) (Last updated: 10/31/2017) ____________________________________________________________________________________________       

## 2017-11-05 NOTE — Progress Notes (Signed)
Nursing Pain Medication Assessment:  Safety precautions to be maintained throughout the outpatient stay will include: orient to surroundings, keep bed in low position, maintain call bell within reach at all times, provide assistance with transfer out of bed and ambulation.  Medication Inspection Compliance: Pill count conducted under aseptic conditions, in front of the patient. Neither the pills nor the bottle was removed from the patient's sight at any time. Once count was completed pills were immediately returned to the patient in their original bottle.  Medication: Hydrocodone/APAP Pill/Patch Count: 15 of 60 pills remain Pill/Patch Appearance: Markings consistent with prescribed medication Bottle Appearance: Standard pharmacy container. Clearly labeled. Filled Date: 02/10 / 2019 Last Medication intake:  Today

## 2017-11-05 NOTE — Patient Instructions (Addendum)
____________________________________________________________________________________________  Medication Rules  Applies to: All patients receiving prescriptions (written or electronic).  Pharmacy of record: Pharmacy where electronic prescriptions will be sent. If written prescriptions are taken to a different pharmacy, please inform the nursing staff. The pharmacy listed in the electronic medical record should be the one where you would like electronic prescriptions to be sent.  Prescription refills: Only during scheduled appointments. Applies to both, written and electronic prescriptions.  NOTE: The following applies primarily to controlled substances (Opioid* Pain Medications).   Patient's responsibilities: 1. Pain Pills: Bring all pain pills to every appointment (except for procedure appointments). 2. Pill Bottles: Bring pills in original pharmacy bottle. Always bring newest bottle. Bring bottle, even if empty. 3. Medication refills: You are responsible for knowing and keeping track of what medications you need refilled. The day before your appointment, write a list of all prescriptions that need to be refilled. Bring that list to your appointment and give it to the admitting nurse. Prescriptions will be written only during appointments. If you forget a medication, it will not be "Called in", "Faxed", or "electronically sent". You will need to get another appointment to get these prescribed. 4. Prescription Accuracy: You are responsible for carefully inspecting your prescriptions before leaving our office. Have the discharge nurse carefully go over each prescription with you, before taking them home. Make sure that your name is accurately spelled, that your address is correct. Check the name and dose of your medication to make sure it is accurate. Check the number of pills, and the written instructions to make sure they are clear and accurate. Make sure that you are given enough medication to last  until your next medication refill appointment. 5. Taking Medication: Take medication as prescribed. Never take more pills than instructed. Never take medication more frequently than prescribed. Taking less pills or less frequently is permitted and encouraged, when it comes to controlled substances (written prescriptions).  6. Inform other Doctors: Always inform, all of your healthcare providers, of all the medications you take. 7. Pain Medication from other Providers: You are not allowed to accept any additional pain medication from any other Doctor or Healthcare provider. There are two exceptions to this rule. (see below) In the event that you require additional pain medication, you are responsible for notifying us, as stated below. 8. Medication Agreement: You are responsible for carefully reading and following our Medication Agreement. This must be signed before receiving any prescriptions from our practice. Safely store a copy of your signed Agreement. Violations to the Agreement will result in no further prescriptions. (Additional copies of our Medication Agreement are available upon request.) 9. Laws, Rules, & Regulations: All patients are expected to follow all Federal and State Laws, Statutes, Rules, & Regulations. Ignorance of the Laws does not constitute a valid excuse. The use of any illegal substances is prohibited. 10. Adopted CDC guidelines & recommendations: Target dosing levels will be at or below 60 MME/day. Use of benzodiazepines** is not recommended.  Exceptions: There are only two exceptions to the rule of not receiving pain medications from other Healthcare Providers. 1. Exception #1 (Emergencies): In the event of an emergency (i.e.: accident requiring emergency care), you are allowed to receive additional pain medication. However, you are responsible for: As soon as you are able, call our office [phone], at any time of the day or night, and leave a message stating your name, the  date and nature of the emergency, and the name and dose of the medication   prescribed. In the event that your call is answered by a member of our staff, make sure to document and save the date, time, and the name of the person that took your information.  2. Exception #2 (Planned Surgery): In the event that you are scheduled by another doctor or dentist to have any type of surgery or procedure, you are allowed (for a period no longer than 30 days), to receive additional pain medication, for the acute post-op pain. However, in this case, you are responsible for picking up a copy of our "Post-op Pain Management for Surgeons" handout, and giving it to your surgeon or dentist. This document is available at our office, and does not require an appointment to obtain it. Simply go to our office during business hours (Monday-Thursday from 8:00 AM to 4:00 PM) (Friday 8:00 AM to 12:00 Noon) or if you have a scheduled appointment with [place], prior to your surgery, and ask for it by name. In addition, you will need to provide [place] with your name, name of your surgeon, type of surgery, and date of procedure or surgery.  *Opioid medications include: morphine, codeine, oxycodone, oxymorphone, hydrocodone, hydromorphone, meperidine, tramadol, tapentadol, buprenorphine, fentanyl, methadone. **Benzodiazepine medications include: diazepam (Valium), alprazolam (Xanax), clonazepam (Klonopine), lorazepam (Ativan), clorazepate (Tranxene), chlordiazepoxide (Librium), estazolam (Prosom), oxazepam (Serax), temazepam (Restoril), triazolam (Halcion)  You were given 3 prescriptions for Hydrocodone today. A prescription for Gabapentin was sent to your pharmacy. (Last updated: 10/31/2017) ____________________________________________________________________________________________

## 2017-11-09 LAB — TOXASSURE SELECT 13 (MW), URINE

## 2018-02-04 ENCOUNTER — Other Ambulatory Visit: Payer: Self-pay

## 2018-02-04 ENCOUNTER — Ambulatory Visit: Payer: Medicare Other | Attending: Nurse Practitioner | Admitting: Nurse Practitioner

## 2018-02-04 ENCOUNTER — Encounter: Payer: Self-pay | Admitting: Nurse Practitioner

## 2018-02-04 VITALS — BP 131/81 | HR 78 | Temp 98.1°F | Resp 16 | Ht 63.0 in | Wt 170.0 lb

## 2018-02-04 DIAGNOSIS — Z7984 Long term (current) use of oral hypoglycemic drugs: Secondary | ICD-10-CM | POA: Diagnosis not present

## 2018-02-04 DIAGNOSIS — E559 Vitamin D deficiency, unspecified: Secondary | ICD-10-CM | POA: Diagnosis not present

## 2018-02-04 DIAGNOSIS — M47816 Spondylosis without myelopathy or radiculopathy, lumbar region: Secondary | ICD-10-CM | POA: Diagnosis not present

## 2018-02-04 DIAGNOSIS — L7682 Other postprocedural complications of skin and subcutaneous tissue: Secondary | ICD-10-CM

## 2018-02-04 DIAGNOSIS — Z79891 Long term (current) use of opiate analgesic: Secondary | ICD-10-CM | POA: Insufficient documentation

## 2018-02-04 DIAGNOSIS — E785 Hyperlipidemia, unspecified: Secondary | ICD-10-CM | POA: Diagnosis not present

## 2018-02-04 DIAGNOSIS — I252 Old myocardial infarction: Secondary | ICD-10-CM | POA: Diagnosis not present

## 2018-02-04 DIAGNOSIS — M25512 Pain in left shoulder: Secondary | ICD-10-CM | POA: Insufficient documentation

## 2018-02-04 DIAGNOSIS — M545 Low back pain: Secondary | ICD-10-CM | POA: Insufficient documentation

## 2018-02-04 DIAGNOSIS — M533 Sacrococcygeal disorders, not elsewhere classified: Secondary | ICD-10-CM | POA: Insufficient documentation

## 2018-02-04 DIAGNOSIS — M940 Chondrocostal junction syndrome [Tietze]: Secondary | ICD-10-CM

## 2018-02-04 DIAGNOSIS — R700 Elevated erythrocyte sedimentation rate: Secondary | ICD-10-CM | POA: Insufficient documentation

## 2018-02-04 DIAGNOSIS — M25511 Pain in right shoulder: Secondary | ICD-10-CM | POA: Insufficient documentation

## 2018-02-04 DIAGNOSIS — G894 Chronic pain syndrome: Secondary | ICD-10-CM | POA: Diagnosis not present

## 2018-02-04 DIAGNOSIS — Z794 Long term (current) use of insulin: Secondary | ICD-10-CM | POA: Diagnosis not present

## 2018-02-04 DIAGNOSIS — M25551 Pain in right hip: Secondary | ICD-10-CM | POA: Insufficient documentation

## 2018-02-04 DIAGNOSIS — I2510 Atherosclerotic heart disease of native coronary artery without angina pectoris: Secondary | ICD-10-CM | POA: Insufficient documentation

## 2018-02-04 DIAGNOSIS — M792 Neuralgia and neuritis, unspecified: Secondary | ICD-10-CM

## 2018-02-04 DIAGNOSIS — M109 Gout, unspecified: Secondary | ICD-10-CM | POA: Insufficient documentation

## 2018-02-04 DIAGNOSIS — Z955 Presence of coronary angioplasty implant and graft: Secondary | ICD-10-CM | POA: Insufficient documentation

## 2018-02-04 DIAGNOSIS — Z951 Presence of aortocoronary bypass graft: Secondary | ICD-10-CM | POA: Insufficient documentation

## 2018-02-04 DIAGNOSIS — I429 Cardiomyopathy, unspecified: Secondary | ICD-10-CM | POA: Diagnosis not present

## 2018-02-04 DIAGNOSIS — Z87891 Personal history of nicotine dependence: Secondary | ICD-10-CM | POA: Diagnosis not present

## 2018-02-04 DIAGNOSIS — J449 Chronic obstructive pulmonary disease, unspecified: Secondary | ICD-10-CM | POA: Insufficient documentation

## 2018-02-04 DIAGNOSIS — I10 Essential (primary) hypertension: Secondary | ICD-10-CM | POA: Insufficient documentation

## 2018-02-04 DIAGNOSIS — F1021 Alcohol dependence, in remission: Secondary | ICD-10-CM | POA: Insufficient documentation

## 2018-02-04 DIAGNOSIS — R079 Chest pain, unspecified: Secondary | ICD-10-CM | POA: Diagnosis present

## 2018-02-04 DIAGNOSIS — G4733 Obstructive sleep apnea (adult) (pediatric): Secondary | ICD-10-CM | POA: Insufficient documentation

## 2018-02-04 DIAGNOSIS — M25552 Pain in left hip: Secondary | ICD-10-CM | POA: Diagnosis not present

## 2018-02-04 DIAGNOSIS — Z8249 Family history of ischemic heart disease and other diseases of the circulatory system: Secondary | ICD-10-CM | POA: Insufficient documentation

## 2018-02-04 DIAGNOSIS — Z79899 Other long term (current) drug therapy: Secondary | ICD-10-CM | POA: Diagnosis not present

## 2018-02-04 DIAGNOSIS — R531 Weakness: Secondary | ICD-10-CM | POA: Insufficient documentation

## 2018-02-04 DIAGNOSIS — Z7982 Long term (current) use of aspirin: Secondary | ICD-10-CM | POA: Insufficient documentation

## 2018-02-04 MED ORDER — HYDROCODONE-ACETAMINOPHEN 5-325 MG PO TABS: 1.0000 | ORAL_TABLET | Freq: Two times a day (BID) | ORAL | 0 refills | Status: DC | PRN

## 2018-02-04 MED ORDER — GABAPENTIN 600 MG PO TABS
600.0000 mg | ORAL_TABLET | Freq: Two times a day (BID) | ORAL | 0 refills | Status: DC
Start: 2018-02-10 — End: 2018-05-06

## 2018-02-04 NOTE — Progress Notes (Signed)
Patient's Name: [name] [name].  MRN: [id number]  Referring Provider: Elisabeth Cara, NP  DOB: 1956/03/25  PCP: Elisabeth Cara, NP  DOS: 02/04/2018  Note by: Vevelyn Francois NP  Service setting: Ambulatory outpatient  Specialty: Interventional Pain Management  Location: [facility] (AMB) Pain Management Facility    Patient type: Established    Primary Reason(s) for Visit: Encounter for prescription drug management. (Level of risk: moderate)  CC: Pain (chest incision); Shoulder Pain (right); and Hip Pain (bilateral)  HPI  [name] [name] is a 62 y.o. year old, male patient, who comes today for a medication management evaluation. He has Apnea, sleep; H/O coronary artery bypass surgery; OSA (obstructive sleep apnea); Hypertension; Hyperlipidemia; Coronary artery disease; Chronic obstructive pulmonary disease ([place]); Alcohol withdrawal syndrome ([place]); Injury of kidney; Chronic low back pain (Bilateral) (R>L); Chronic shoulder pain (Location of Tertiary source of pain) (Right); Chronic hip pain (Left); History of alcoholism ([place]); Opiate use; Encounter for therapeutic drug level monitoring; Chronic chest wall pain (Location of Primary Source of Pain) (Incisional Midline) (since 04/22/2012); Chronic lower extremity pain (Location of Secondary source of pain) (Bilateral) (R>L); History of MI (myocardial infarction) (January 2014); Encounter for pain management planning; Chronic sacroiliac joint pain (Bilateral) (L>R); Neuropathic pain; Neurogenic pain; Incisional pain; Elevated sedimentation rate; Vitamin D insufficiency; Lumbar facet syndrome (Bilateral) (R>L); Lumbar spondylosis; Long term current use of opiate analgesic; Long term prescription opiate use; Keloid skin disorder; Costochondritis; Unstable angina ([place]); Chest pain; Brachial plexus injury, right; Carotid atherosclerosis; Hypoalbuminemia; Hypokalemia; Right sided weakness; S/P CABG (coronary artery bypass graft); Opioid-induced constipation (OIC); Pneumonia;  Pressure injury of skin; COPD (chronic obstructive pulmonary disease) ([place]); and Chronic pain syndrome on their problem list. His primarily concern today is the Pain (chest incision); Shoulder Pain (right); and Hip Pain (bilateral)  Pain Assessment: Location: Upper Chest(cchest./rib) Radiating: denies Duration: Chronic pain Quality: Aching, Constant, Tender, Discomfort Severity: 4 /10 (subjective, self-reported pain score)  Note: Reported level is compatible with observation.                          Effect on ADL:   Timing:   Modifying factors:   BP: 131/81  HR: 78  [name] [name] was last scheduled for an appointment on 11/05/2017 for medication management. During today's appointment we reviewed [name] [name] chronic pain status, as well as his outpatient medication regimen. He denies any changes in his pain. He admits that it is due to previous surgeries.   The patient  reports that he does not use drugs. His body mass index is 30.11 kg/m.  Further details on both, my assessment(s), as well as the proposed treatment plan, please see below.  Controlled Substance Pharmacotherapy Assessment REMS (Risk Evaluation and Mitigation Strategy)  Analgesic:Hydrocodone/APAP 5/325 one tablet twice a day (10 mg per day) MME/day:10 mg/day.       Ignatius Specking, RN  02/04/2018  1:50 PM  Sign at close encounter Nursing Pain Medication Assessment:  Safety precautions to be maintained throughout the outpatient stay will include: orient to surroundings, keep bed in low position, maintain call bell within reach at all times, provide assistance with transfer out of bed and ambulation.  Medication Inspection Compliance: Pill count conducted under aseptic conditions, in front of the patient. Neither the pills nor the bottle was removed from the patient's sight at any time. Once count was completed pills were immediately returned to the patient in their original bottle.  Medication:  Hydrocodone/APAP Pill/Patch Count:  13 of 60 pills remain Pill/Patch Appearance: Markings consistent with prescribed medication Bottle Appearance: Standard pharmacy container. Clearly labeled. Filled Date: 5 / 74 / 2019 Last Medication intake:  Today   Pharmacokinetics: Liberation and absorption (onset of action): WNL Distribution (time to peak effect): WNL Metabolism and excretion (duration of action): WNL         Pharmacodynamics: Desired effects: Analgesia: [name] [name] reports >50% benefit. Functional ability: Patient reports that medication allows him to accomplish basic ADLs Clinically meaningful improvement in function (CMIF): Sustained CMIF goals met Perceived effectiveness: Described as relatively effective, allowing for increase in activities of daily living (ADL) Undesirable effects: Side-effects or Adverse reactions: None reported Monitoring:  PMP: Online review of the past 8-monthperiod conducted. Compliant with practice rules and regulations Last UDS on record: Summary  Date Value Ref Range Status  11/05/2017 FINAL  Final    Comment:    ==================================================================== TOXASSURE SELECT 13 (MW) ==================================================================== Test                             Result       Flag       Units Drug Present and Declared for Prescription Verification   Hydrocodone                    984          EXPECTED   ng/mg creat   Hydromorphone                  293          EXPECTED   ng/mg creat   Dihydrocodeine                 41           EXPECTED   ng/mg creat   Norhydrocodone                 483          EXPECTED   ng/mg creat    Sources of hydrocodone include scheduled prescription    medications. Hydromorphone, dihydrocodeine and norhydrocodone are    expected metabolites of hydrocodone. Hydromorphone and    dihydrocodeine are also available as scheduled prescription     medications. ==================================================================== Test                      Result    Flag   Units      Ref Range   Creatinine              205              mg/dL      >=20 ==================================================================== Declared Medications:  The flagging and interpretation on this report are based on the  following declared medications.  Unexpected results may arise from  inaccuracies in the declared medications.  **Note: The testing scope of this panel includes these medications:  Hydrocodone (Norco)  **Note: The testing scope of this panel does not include following  reported medications:  Acetaminophen (Norco)  Allopurinol  Amlodipine (Norvasc)  Aspirin (Aspirin 81)  Atorvastatin (Lipitor)  Gabapentin  Hydrochlorothiazide  Insulin (Levemir)  Isosorbide (Imdur)  Linagliptin (Tradjenta)  Losartan (Cozaar)  Metoprolol (Lopressor)  Multivitamin  Nitroglycerin  Potassium  Vitamin D3 ==================================================================== For clinical consultation, please call [phone] ====================================================================    UDS interpretation: Compliant          Medication Assessment  Form: Reviewed. Patient indicates being compliant with therapy Treatment compliance: Compliant Risk Assessment Profile: Aberrant behavior: See prior evaluations. None observed or detected today Comorbid factors increasing risk of overdose: See prior notes. No additional risks detected today Risk of substance use disorder (SUD): Low Opioid Risk Tool - 02/04/18 1358      Personal History of Substance Abuse   Alcohol  Negative    Illegal Drugs  Negative    Rx Drugs  Negative      Psychological Disease   Psychological Disease  Negative    Depression  Negative      Total Score   Opioid Risk Tool Scoring  0    Opioid Risk Interpretation  Low Risk      ORT Scoring interpretation  table:  Score <3 = Low Risk for SUD  Score between 4-7 = Moderate Risk for SUD  Score >8 = High Risk for Opioid Abuse   Risk Mitigation Strategies:  Patient Counseling: Covered Patient-Prescriber Agreement (PPA): Present and active  Notification to other healthcare providers: Done  Pharmacologic Plan: No change in therapy, at this time.             Laboratory Chemistry  Inflammation Markers (CRP: Acute Phase) (ESR: Chronic Phase) Lab Results  Component Value Date   CRP <0.5 10/26/2015   ESRSEDRATE 32 (H) 10/26/2015   LATICACIDVEN 1.4 05/28/2016                         Rheumatology Markers No results found for: RF, ANA, LABURIC, URICUR, LYMEIGGIGMAB, LYMEABIGMQN, HLAB27                      Renal Function Markers Lab Results  Component Value Date   BUN 24 (H) 06/16/2017   CREATININE 1.02 06/16/2017   GFRAA >60 06/16/2017   GFRNONAA >60 06/16/2017                              Hepatic Function Markers Lab Results  Component Value Date   AST 63 (H) 12/04/2016   ALT 55 12/04/2016   ALBUMIN 4.7 12/04/2016   ALKPHOS 62 12/04/2016   LIPASE 29 03/19/2015                        Electrolytes Lab Results  Component Value Date   NA 136 06/16/2017   K 3.2 (L) 06/16/2017   CL 97 (L) 06/16/2017   CALCIUM 9.2 06/16/2017   MG 2.0 10/26/2015                        Neuropathy Markers Lab Results  Component Value Date   HGBA1C 6.1 04/17/2012                        Bone Pathology Markers Lab Results  Component Value Date   VD25OH 26.5 (L) 10/26/2015   VD125OH2TOT 30.8 10/26/2015                         Coagulation Parameters Lab Results  Component Value Date   INR 0.97 02/09/2015   LABPROT 13.1 02/09/2015   APTT > 160.0 (HH) 04/21/2012   PLT 192 06/16/2017  Cardiovascular Markers Lab Results  Component Value Date   BNP 255 (H) 08/01/2014   CKTOTAL 82 05/16/2013   CKMB 1.4 05/16/2013   TROPONINI <0.03 06/16/2017   HGB 13.9 06/16/2017    HCT 39.6 (L) 06/16/2017                         CA Markers No results found for: CEA, CA125, LABCA2                      Note: Lab results reviewed.  Recent Diagnostic Imaging Results  DG Chest 2 View CLINICAL DATA:  Acute onset of generalized chest pain. High blood pressure. Initial encounter.  EXAM: CHEST  2 VIEW  COMPARISON:  Chest radiograph performed 09/12/2016  FINDINGS: The lungs are well-aerated. Pulmonary vascularity is at the upper limits of normal. There is no evidence of focal opacification, pleural effusion or pneumothorax.  The heart is borderline normal in size. The patient is status post median sternotomy, with evidence of prior CABG. No acute osseous abnormalities are seen. Clips are noted at the right axilla.  IMPRESSION: No acute cardiopulmonary process seen.  Electronically Signed   By: Garald Balding M.D.   On: 06/16/2017 03:11  Complexity Note: Imaging results reviewed. Results shared with Mr. Adriano, using Layman's terms.                         Meds   Current Outpatient Medications:  .  allopurinol (ZYLOPRIM) 300 MG tablet, Take 100 mg by mouth daily. *patient takes in the afternoon*, Disp: , Rfl:  .  amLODipine (NORVASC) 10 MG tablet, Take 5 mg by mouth daily. , Disp: , Rfl:  .  aspirin EC 81 MG tablet, Take 81 mg by mouth every morning., Disp: , Rfl:  .  atorvastatin (LIPITOR) 40 MG tablet, Take 40 mg by mouth at bedtime. , Disp: , Rfl:  .  Cholecalciferol (VITAMIN D3) 2000 units TABS, Take 2,000 Units by mouth every morning., Disp: , Rfl:  .  [START ON 02/10/2018] gabapentin (NEURONTIN) 600 MG tablet, Take 1 tablet (600 mg total) by mouth 2 (two) times daily., Disp: 180 tablet, Rfl: 0 .  hydrochlorothiazide (HYDRODIURIL) 25 MG tablet, Take 25 mg by mouth daily. , Disp: , Rfl:  .  [START ON 04/11/2018] HYDROcodone-acetaminophen (NORCO/VICODIN) 5-325 MG tablet, Take 1 tablet by mouth 2 (two) times daily as needed for severe pain., Disp: 60  tablet, Rfl: 0 .  Insulin Detemir (LEVEMIR FLEXTOUCH) 100 UNIT/ML Pen, Inject 20 Units into the skin 2 (two) times daily., Disp: , Rfl:  .  isosorbide mononitrate (IMDUR) 30 MG 24 hr tablet, Take 30 mg by mouth every morning. , Disp: , Rfl: 1 .  linagliptin (TRADJENTA) 5 MG TABS tablet, Take 5 mg by mouth daily., Disp: , Rfl:  .  losartan (COZAAR) 100 MG tablet, take 1 tablet by mouth once daily, Disp: , Rfl:  .  metoprolol (LOPRESSOR) 50 MG tablet, Take 50 mg by mouth 3 (three) times daily., Disp: , Rfl:  .  Multiple Vitamin (MULTIVITAMIN WITH MINERALS) TABS tablet, Take 1 tablet by mouth every evening., Disp: , Rfl:  .  nitroGLYCERIN (NITROSTAT) 0.4 MG SL tablet, Place 0.4 mg under the tongue every 5 (five) minutes x 3 doses as needed for chest pain. , Disp: , Rfl:  .  Potassium 95 MG TABS, Take 95 mg by mouth every morning. , Disp: ,  Rfl:  .  [START ON 03/12/2018] HYDROcodone-acetaminophen (NORCO/VICODIN) 5-325 MG tablet, Take 1 tablet by mouth 2 (two) times daily as needed for severe pain., Disp: 60 tablet, Rfl: 0 .  [START ON 02/10/2018] HYDROcodone-acetaminophen (NORCO/VICODIN) 5-325 MG tablet, Take 1 tablet by mouth 2 (two) times daily as needed for severe pain., Disp: 60 tablet, Rfl: 0  ROS  Constitutional: Denies any fever or chills Gastrointestinal: No reported hemesis, hematochezia, vomiting, or acute GI distress Musculoskeletal: Denies any acute onset joint swelling, redness, loss of ROM, or weakness Neurological: No reported episodes of acute onset apraxia, aphasia, dysarthria, agnosia, amnesia, paralysis, loss of coordination, or loss of consciousness  Allergies  Mr. Dugdale has No Known Allergies.  PFSH  Drug: Mr. Depass  reports that he does not use drugs. Alcohol:  reports that he drinks alcohol. Tobacco:  reports that he has quit smoking. He quit after 25.00 years of use. He has quit using smokeless tobacco. Medical:  has a past medical history of Benign esophageal  stricture, Cardiomyopathy, secondary ([place]), Coronary artery disease, Diverticulitis, GI bleed, Gout, Hemorrhoids, Hyperlipidemia, Hypertension, Sleep apnea, and Tubular adenoma of colon. Surgical: Mr. Lazo  has a past surgical history that includes Coronary angioplasty with stent; cardiac bypass; Cardiac surgery; Coronary artery bypass graft; Colonoscopy with propofol (N/A, 04/29/2015); Esophagogastroduodenoscopy (egd) with propofol (N/A, 04/29/2015); Septoplasty; and Cardiac catheterization (Left, 02/03/2016). Family: family history includes Heart disease in his mother; Hypertension in his father.  Constitutional Exam  General appearance: Well nourished, well developed, and well hydrated. In no apparent acute distress Vitals:   02/04/18 1346  BP: 131/81  Pulse: 78  Resp: 16  Temp: 98.1 F (36.7 C)  SpO2: 100%  Weight: 170 lb (77.1 kg)  Height: '5\' 3"'  (1.6 m)  Psych/Mental status: Alert, oriented x 3 (person, place, & time)       Eyes: PERLA Respiratory: No evidence of acute respiratory distress CHEST: Well healed incision   Gait & Posture Assessment  Ambulation: Unassisted Gait: Relatively normal for age and body habitus Posture: WNL    Assessment  Primary Diagnosis & Pertinent Problem List: The primary encounter diagnosis was Incisional pain. Diagnoses of Costochondritis, Neurogenic pain, and Chronic pain syndrome were also pertinent to this visit.  Status Diagnosis  Controlled Controlled Controlled 1. Incisional pain   2. Costochondritis   3. Neurogenic pain   4. Chronic pain syndrome     Problems updated and reviewed during this visit: Problem  Hyperlipidemia   Plan of Care  Pharmacotherapy (Medications Ordered): Meds ordered this encounter  Medications  . HYDROcodone-acetaminophen (NORCO/VICODIN) 5-325 MG tablet    Sig: Take 1 tablet by mouth 2 (two) times daily as needed for severe pain.    Dispense:  60 tablet    Refill:  0    DO NOT DELETE, even if Expired!!!  Patient may have prescription filled one day early if pharmacy is closed on scheduled refill date. Do not fill until:04/11/2018 To last until:05/11/2018    Order Specific Question:   Supervising Provider    Answer:   Milinda Pointer [phone]  . HYDROcodone-acetaminophen (NORCO/VICODIN) 5-325 MG tablet    Sig: Take 1 tablet by mouth 2 (two) times daily as needed for severe pain.    Dispense:  60 tablet    Refill:  0    DO NOT DELETE, even if Expired!!! Patient may have prescription filled one day early if pharmacy is closed on scheduled refill date. Do not fill until: 03/12/2018 To last until:04/11/2018  Order Specific Question:   Supervising Provider    Answer:   Milinda Pointer [phone]  . HYDROcodone-acetaminophen (NORCO/VICODIN) 5-325 MG tablet    Sig: Take 1 tablet by mouth 2 (two) times daily as needed for severe pain.    Dispense:  60 tablet    Refill:  0    DO NOT DELETE, even if Expired!!! Patient may have prescription filled one day early if pharmacy is closed on scheduled refill date. Do not fill until: 02/10/2018 To last until:03/12/2018    Order Specific Question:   Supervising Provider    Answer:   Milinda Pointer [phone]  . gabapentin (NEURONTIN) 600 MG tablet    Sig: Take 1 tablet (600 mg total) by mouth 2 (two) times daily.    Dispense:  180 tablet    Refill:  0    Do not add this medication to the electronic "Automatic Refill" notification system. Patient may have prescription filled one day early if pharmacy is closed on scheduled refill date.    Order Specific Question:   Supervising Provider    Answer:   Milinda Pointer [[id number]]   New Prescriptions   No medications on file   Medications administered today: Margaretha Seeds. "Tim" had no medications administered during this visit. Lab-work, procedure(s), and/or referral(s): No orders of the defined types were placed in this encounter.  Imaging and/or referral(s): None  Interventional  therapies: Planned, scheduled, and/or pending: None at this time.   Considering: Lumbar facet and sacroiliac joint radiofrequency ablation.   Palliative PRN treatment(s): Diagnostic left intra-articular hip joint injection under fluoroscopic guidance, with or without sedation      Provider-requested follow-up: Return in about 3 months (around 05/07/2018) for MedMgmt with Me Donella Stade [facility]).  Future Appointments  Date Time Provider [place]  05/06/2018  1:30 PM Vevelyn Francois, NP [facility] None   Primary Care Physician: Elisabeth Cara, NP Location: [facility] Outpatient Pain Management Facility Note by: Vevelyn Francois NP Date: 02/04/2018; Time: 3:09 PM  Pain Score Disclaimer: We use the NRS-11 scale. This is a self-reported, subjective measurement of pain severity with only modest accuracy. It is used primarily to identify changes within a particular patient. It must be understood that outpatient pain scales are significantly less accurate that those used for research, where they can be applied under ideal controlled circumstances with minimal exposure to variables. In reality, the score is likely to be a combination of pain intensity and pain affect, where pain affect describes the degree of emotional arousal or changes in action readiness caused by the sensory experience of pain. Factors such as social and work situation, setting, emotional state, anxiety levels, expectation, and prior pain experience may influence pain perception and show large inter-individual differences that may also be affected by time variables.  Patient instructions provided during this appointment: Patient Instructions   ____________________________________________________________________________________________  Medication Rules  Applies to: All patients receiving prescriptions (written or electronic).  Pharmacy of record: Pharmacy where electronic prescriptions will be sent. If written  prescriptions are taken to a different pharmacy, please inform the nursing staff. The pharmacy listed in the electronic medical record should be the one where you would like electronic prescriptions to be sent.  Prescription refills: Only during scheduled appointments. Applies to both, written and electronic prescriptions.  NOTE: The following applies primarily to controlled substances (Opioid* Pain Medications).   Patient's responsibilities: 1. Pain Pills: Bring all pain pills to every appointment (except for procedure appointments). 2. Pill Bottles: Bring pills  in original pharmacy bottle. Always bring newest bottle. Bring bottle, even if empty. 3. Medication refills: You are responsible for knowing and keeping track of what medications you need refilled. The day before your appointment, write a list of all prescriptions that need to be refilled. Bring that list to your appointment and give it to the admitting nurse. Prescriptions will be written only during appointments. If you forget a medication, it will not be "Called in", "Faxed", or "electronically sent". You will need to get another appointment to get these prescribed. 4. Prescription Accuracy: You are responsible for carefully inspecting your prescriptions before leaving our office. Have the discharge nurse carefully go over each prescription with you, before taking them home. Make sure that your name is accurately spelled, that your address is correct. Check the name and dose of your medication to make sure it is accurate. Check the number of pills, and the written instructions to make sure they are clear and accurate. Make sure that you are given enough medication to last until your next medication refill appointment. 5. Taking Medication: Take medication as prescribed. Never take more pills than instructed. Never take medication more frequently than prescribed. Taking less pills or less frequently is permitted and encouraged, when it comes to  controlled substances (written prescriptions).  6. Inform other Doctors: Always inform, all of your healthcare providers, of all the medications you take. 7. Pain Medication from other Providers: You are not allowed to accept any additional pain medication from any other Doctor or Healthcare provider. There are two exceptions to this rule. (see below) In the event that you require additional pain medication, you are responsible for notifying us, as stated below. 8. Medication Agreement: You are responsible for carefully reading and following our Medication Agreement. This must be signed before receiving any prescriptions from our practice. Safely store a copy of your signed Agreement. Violations to the Agreement will result in no further prescriptions. (Additional copies of our Medication Agreement are available upon request.) 9. Laws, Rules, & Regulations: All patients are expected to follow all Federal and [employer], [employer], Rules, [employer]. Ignorance of the Laws does not constitute a valid excuse. The use of any illegal substances is prohibited. 10. Adopted CDC guidelines & recommendations: Target dosing levels will be at or below 60 MME/day. Use of benzodiazepines** is not recommended.  Exceptions: There are only two exceptions to the rule of not receiving pain medications from other Healthcare Providers. 1. Exception #1 (Emergencies): In the event of an emergency (i.e.: accident requiring emergency care), you are allowed to receive additional pain medication. However, you are responsible for: As soon as you are able, call our office [phone], at any time of the day or night, and leave a message stating your name, the date and nature of the emergency, and the name and dose of the medication prescribed. In the event that your call is answered by a member of our staff, make sure to document and save the date, time, and the name of the person that took your information.  2. Exception #2  (Planned Surgery): In the event that you are scheduled by another doctor or dentist to have any type of surgery or procedure, you are allowed (for a period no longer than 30 days), to receive additional pain medication, for the acute post-op pain. However, in this case, you are responsible for picking up a copy of our "Post-op Pain Management for Surgeons" handout, and giving it to your surgeon or dentist.  This document is available at our office, and does not require an appointment to obtain it. Simply go to our office during business hours (Monday-Thursday from 8:00 AM to 4:00 PM) (Friday 8:00 AM to 12:00 Noon) or if you have a scheduled appointment with [place], prior to your surgery, and ask for it by name. In addition, you will need to provide [place] with your name, name of your surgeon, type of surgery, and date of procedure or surgery.  *Opioid medications include: morphine, codeine, oxycodone, oxymorphone, hydrocodone, hydromorphone, meperidine, tramadol, tapentadol, buprenorphine, fentanyl, methadone. **Benzodiazepine medications include: diazepam (Valium), alprazolam (Xanax), clonazepam (Klonopine), lorazepam (Ativan), clorazepate (Tranxene), chlordiazepoxide (Librium), estazolam (Prosom), oxazepam (Serax), temazepam (Restoril), triazolam (Halcion) (Last updated: 10/31/2017) ____________________________________________________________________________________________    BMI Assessment: Estimated body mass index is 30.11 kg/m as calculated from the following:   Height as of this encounter: '5\' 3"'  (1.6 m).   Weight as of this encounter: 170 lb (77.1 kg).  BMI interpretation table: BMI level Category Range association with higher incidence of chronic pain  <18 kg/m2 Underweight   18.5-24.9 kg/m2 Ideal body weight   25-29.9 kg/m2 Overweight Increased incidence by 20%  30-34.9 kg/m2 Obese (Class I) Increased incidence by 68%  35-39.9 kg/m2 Severe obesity (Class II) Increased incidence by 136%  >40  kg/m2 Extreme obesity (Class III) Increased incidence by 254%   Patient's current BMI Ideal Body weight  Body mass index is 30.11 kg/m. Ideal body weight: 56.9 kg (125 lb 7.1 oz) Adjusted ideal body weight: 65 kg (143 lb 4.2 oz)   BMI Readings from Last 4 Encounters:  02/04/18 30.11 kg/m  11/05/17 27.63 kg/m  08/06/17 30.11 kg/m  06/16/17 30.11 kg/m   Wt Readings from Last 4 Encounters:  02/04/18 170 lb (77.1 kg)  11/05/17 156 lb (70.8 kg)  08/06/17 170 lb (77.1 kg)  06/16/17 170 lb (77.1 kg)

## 2018-02-04 NOTE — Patient Instructions (Addendum)
____________________________________________________________________________________________  Medication Rules  Applies to: All patients receiving prescriptions (written or electronic).  Pharmacy of record: Pharmacy where electronic prescriptions will be sent. If written prescriptions are taken to a different pharmacy, please inform the nursing staff. The pharmacy listed in the electronic medical record should be the one where you would like electronic prescriptions to be sent.  Prescription refills: Only during scheduled appointments. Applies to both, written and electronic prescriptions.  NOTE: The following applies primarily to controlled substances (Opioid* Pain Medications).   Patient's responsibilities: 1. Pain Pills: Bring all pain pills to every appointment (except for procedure appointments). 2. Pill Bottles: Bring pills in original pharmacy bottle. Always bring newest bottle. Bring bottle, even if empty. 3. Medication refills: You are responsible for knowing and keeping track of what medications you need refilled. The day before your appointment, write a list of all prescriptions that need to be refilled. Bring that list to your appointment and give it to the admitting nurse. Prescriptions will be written only during appointments. If you forget a medication, it will not be "Called in", "Faxed", or "electronically sent". You will need to get another appointment to get these prescribed. 4. Prescription Accuracy: You are responsible for carefully inspecting your prescriptions before leaving our office. Have the discharge nurse carefully go over each prescription with you, before taking them home. Make sure that your name is accurately spelled, that your address is correct. Check the name and dose of your medication to make sure it is accurate. Check the number of pills, and the written instructions to make sure they are clear and accurate. Make sure that you are given enough medication to last  until your next medication refill appointment. 5. Taking Medication: Take medication as prescribed. Never take more pills than instructed. Never take medication more frequently than prescribed. Taking less pills or less frequently is permitted and encouraged, when it comes to controlled substances (written prescriptions).  6. Inform other Doctors: Always inform, all of your healthcare providers, of all the medications you take. 7. Pain Medication from other Providers: You are not allowed to accept any additional pain medication from any other Doctor or Healthcare provider. There are two exceptions to this rule. (see below) In the event that you require additional pain medication, you are responsible for notifying us, as stated below. 8. Medication Agreement: You are responsible for carefully reading and following our Medication Agreement. This must be signed before receiving any prescriptions from our practice. Safely store a copy of your signed Agreement. Violations to the Agreement will result in no further prescriptions. (Additional copies of our Medication Agreement are available upon request.) 9. Laws, Rules, & Regulations: All patients are expected to follow all Federal and State Laws, Statutes, Rules, & Regulations. Ignorance of the Laws does not constitute a valid excuse. The use of any illegal substances is prohibited. 10. Adopted CDC guidelines & recommendations: Target dosing levels will be at or below 60 MME/day. Use of benzodiazepines** is not recommended.  Exceptions: There are only two exceptions to the rule of not receiving pain medications from other Healthcare Providers. 1. Exception #1 (Emergencies): In the event of an emergency (i.e.: accident requiring emergency care), you are allowed to receive additional pain medication. However, you are responsible for: As soon as you are able, call our office [phone], at any time of the day or night, and leave a message stating your name, the  date and nature of the emergency, and the name and dose of the medication   prescribed. In the event that your call is answered by a member of our staff, make sure to document and save the date, time, and the name of the person that took your information.  2. Exception #2 (Planned Surgery): In the event that you are scheduled by another doctor or dentist to have any type of surgery or procedure, you are allowed (for a period no longer than 30 days), to receive additional pain medication, for the acute post-op pain. However, in this case, you are responsible for picking up a copy of our "Post-op Pain Management for Surgeons" handout, and giving it to your surgeon or dentist. This document is available at our office, and does not require an appointment to obtain it. Simply go to our office during business hours (Monday-Thursday from 8:00 AM to 4:00 PM) (Friday 8:00 AM to 12:00 Noon) or if you have a scheduled appointment with [place], prior to your surgery, and ask for it by name. In addition, you will need to provide [place] with your name, name of your surgeon, type of surgery, and date of procedure or surgery.  *Opioid medications include: morphine, codeine, oxycodone, oxymorphone, hydrocodone, hydromorphone, meperidine, tramadol, tapentadol, buprenorphine, fentanyl, methadone. **Benzodiazepine medications include: diazepam (Valium), alprazolam (Xanax), clonazepam (Klonopine), lorazepam (Ativan), clorazepate (Tranxene), chlordiazepoxide (Librium), estazolam (Prosom), oxazepam (Serax), temazepam (Restoril), triazolam (Halcion) (Last updated: 10/31/2017) ____________________________________________________________________________________________    BMI Assessment: Estimated body mass index is 30.11 kg/m as calculated from the following:   Height as of this encounter: 5\' 3"  (1.6 m).   Weight as of this encounter: 170 lb (77.1 kg).  BMI interpretation table: BMI level Category Range association with higher  incidence of chronic pain  <18 kg/m2 Underweight   18.5-24.9 kg/m2 Ideal body weight   25-29.9 kg/m2 Overweight Increased incidence by 20%  30-34.9 kg/m2 Obese (Class I) Increased incidence by 68%  35-39.9 kg/m2 Severe obesity (Class II) Increased incidence by 136%  >40 kg/m2 Extreme obesity (Class III) Increased incidence by 254%   Patient's current BMI Ideal Body weight  Body mass index is 30.11 kg/m. Ideal body weight: 56.9 kg (125 lb 7.1 oz) Adjusted ideal body weight: 65 kg (143 lb 4.2 oz)   BMI Readings from Last 4 Encounters:  02/04/18 30.11 kg/m  11/05/17 27.63 kg/m  08/06/17 30.11 kg/m  06/16/17 30.11 kg/m   Wt Readings from Last 4 Encounters:  02/04/18 170 lb (77.1 kg)  11/05/17 156 lb (70.8 kg)  08/06/17 170 lb (77.1 kg)  06/16/17 170 lb (77.1 kg)

## 2018-02-04 NOTE — Progress Notes (Signed)
Nursing Pain Medication Assessment:  Safety precautions to be maintained throughout the outpatient stay will include: orient to surroundings, keep bed in low position, maintain call bell within reach at all times, provide assistance with transfer out of bed and ambulation.  Medication Inspection Compliance: Pill count conducted under aseptic conditions, in front of the patient. Neither the pills nor the bottle was removed from the patient's sight at any time. Once count was completed pills were immediately returned to the patient in their original bottle.  Medication: Hydrocodone/APAP Pill/Patch Count: 13 of 60 pills remain Pill/Patch Appearance: Markings consistent with prescribed medication Bottle Appearance: Standard pharmacy container. Clearly labeled. Filled Date: 5 / 88 / 2019 Last Medication intake:  Today

## 2018-05-06 ENCOUNTER — Encounter: Payer: Self-pay | Admitting: Nurse Practitioner

## 2018-05-06 ENCOUNTER — Ambulatory Visit: Payer: Medicare Other | Attending: Nurse Practitioner | Admitting: Nurse Practitioner

## 2018-05-06 ENCOUNTER — Other Ambulatory Visit: Payer: Self-pay

## 2018-05-06 VITALS — BP 114/63 | HR 67 | Temp 97.9°F | Resp 16 | Ht 63.0 in | Wt 175.0 lb

## 2018-05-06 DIAGNOSIS — S37009A Unspecified injury of unspecified kidney, initial encounter: Secondary | ICD-10-CM | POA: Diagnosis not present

## 2018-05-06 DIAGNOSIS — I252 Old myocardial infarction: Secondary | ICD-10-CM | POA: Diagnosis not present

## 2018-05-06 DIAGNOSIS — F10239 Alcohol dependence with withdrawal, unspecified: Secondary | ICD-10-CM | POA: Insufficient documentation

## 2018-05-06 DIAGNOSIS — Z5181 Encounter for therapeutic drug level monitoring: Secondary | ICD-10-CM | POA: Insufficient documentation

## 2018-05-06 DIAGNOSIS — I25110 Atherosclerotic heart disease of native coronary artery with unstable angina pectoris: Secondary | ICD-10-CM | POA: Insufficient documentation

## 2018-05-06 DIAGNOSIS — I10 Essential (primary) hypertension: Secondary | ICD-10-CM | POA: Insufficient documentation

## 2018-05-06 DIAGNOSIS — M792 Neuralgia and neuritis, unspecified: Secondary | ICD-10-CM

## 2018-05-06 DIAGNOSIS — K5903 Drug induced constipation: Secondary | ICD-10-CM | POA: Diagnosis not present

## 2018-05-06 DIAGNOSIS — Z79891 Long term (current) use of opiate analgesic: Secondary | ICD-10-CM | POA: Diagnosis not present

## 2018-05-06 DIAGNOSIS — Z951 Presence of aortocoronary bypass graft: Secondary | ICD-10-CM | POA: Diagnosis not present

## 2018-05-06 DIAGNOSIS — Z8249 Family history of ischemic heart disease and other diseases of the circulatory system: Secondary | ICD-10-CM | POA: Insufficient documentation

## 2018-05-06 DIAGNOSIS — M533 Sacrococcygeal disorders, not elsewhere classified: Secondary | ICD-10-CM

## 2018-05-06 DIAGNOSIS — R531 Weakness: Secondary | ICD-10-CM | POA: Diagnosis not present

## 2018-05-06 DIAGNOSIS — J449 Chronic obstructive pulmonary disease, unspecified: Secondary | ICD-10-CM | POA: Diagnosis not present

## 2018-05-06 DIAGNOSIS — E876 Hypokalemia: Secondary | ICD-10-CM | POA: Insufficient documentation

## 2018-05-06 DIAGNOSIS — M25552 Pain in left hip: Secondary | ICD-10-CM | POA: Insufficient documentation

## 2018-05-06 DIAGNOSIS — M79604 Pain in right leg: Secondary | ICD-10-CM

## 2018-05-06 DIAGNOSIS — L910 Hypertrophic scar: Secondary | ICD-10-CM | POA: Diagnosis not present

## 2018-05-06 DIAGNOSIS — G4733 Obstructive sleep apnea (adult) (pediatric): Secondary | ICD-10-CM | POA: Insufficient documentation

## 2018-05-06 DIAGNOSIS — R700 Elevated erythrocyte sedimentation rate: Secondary | ICD-10-CM | POA: Insufficient documentation

## 2018-05-06 DIAGNOSIS — M940 Chondrocostal junction syndrome [Tietze]: Secondary | ICD-10-CM | POA: Insufficient documentation

## 2018-05-06 DIAGNOSIS — G8929 Other chronic pain: Secondary | ICD-10-CM

## 2018-05-06 DIAGNOSIS — Z9889 Other specified postprocedural states: Secondary | ICD-10-CM | POA: Insufficient documentation

## 2018-05-06 DIAGNOSIS — E785 Hyperlipidemia, unspecified: Secondary | ICD-10-CM | POA: Diagnosis not present

## 2018-05-06 DIAGNOSIS — Z7982 Long term (current) use of aspirin: Secondary | ICD-10-CM | POA: Insufficient documentation

## 2018-05-06 DIAGNOSIS — M79605 Pain in left leg: Secondary | ICD-10-CM | POA: Diagnosis not present

## 2018-05-06 DIAGNOSIS — I6529 Occlusion and stenosis of unspecified carotid artery: Secondary | ICD-10-CM | POA: Diagnosis not present

## 2018-05-06 DIAGNOSIS — J189 Pneumonia, unspecified organism: Secondary | ICD-10-CM | POA: Insufficient documentation

## 2018-05-06 DIAGNOSIS — M25511 Pain in right shoulder: Secondary | ICD-10-CM | POA: Diagnosis not present

## 2018-05-06 DIAGNOSIS — M47816 Spondylosis without myelopathy or radiculopathy, lumbar region: Secondary | ICD-10-CM | POA: Diagnosis not present

## 2018-05-06 DIAGNOSIS — Z79899 Other long term (current) drug therapy: Secondary | ICD-10-CM | POA: Insufficient documentation

## 2018-05-06 DIAGNOSIS — Z794 Long term (current) use of insulin: Secondary | ICD-10-CM | POA: Insufficient documentation

## 2018-05-06 DIAGNOSIS — G894 Chronic pain syndrome: Secondary | ICD-10-CM

## 2018-05-06 MED ORDER — HYDROCODONE-ACETAMINOPHEN 5-325 MG PO TABS: 1.0000 | ORAL_TABLET | Freq: Two times a day (BID) | ORAL | 0 refills | Status: DC | PRN

## 2018-05-06 MED ORDER — GABAPENTIN 600 MG PO TABS: 600.0000 mg | ORAL_TABLET | Freq: Two times a day (BID) | ORAL | 0 refills | Status: DC

## 2018-05-06 NOTE — Progress Notes (Signed)
Patient's Name: [name] [name].  MRN: [id number]  Referring Provider: Elisabeth Cara, NP  DOB: 15-Jan-1956  PCP: Elisabeth Cara, NP  DOS: 05/06/2018  Note by: Vevelyn Francois NP  Service setting: Ambulatory outpatient  Specialty: Interventional Pain Management  Location: [facility] (AMB) Pain Management Facility    Patient type: Established    Primary Reason(s) for Visit: Encounter for prescription drug management. (Level of risk: moderate)  CC: Leg Pain (lower extrem. bilateral); Back Pain (lower); and Chest Pain (from surgery)  HPI  [name] [name] is a 62 y.o. year old, male patient, who comes today for a medication management evaluation. He has Apnea, sleep; H/O coronary artery bypass surgery; OSA (obstructive sleep apnea); Hypertension; Hyperlipidemia; Coronary artery disease; Chronic obstructive pulmonary disease ([place]); Alcohol withdrawal syndrome ([place]); Injury of kidney; Chronic low back pain (Bilateral) (R>L); Chronic shoulder pain (Location of Tertiary source of pain) (Right); Chronic hip pain (Left); History of alcoholism ([place]); Opiate use; Encounter for therapeutic drug level monitoring; Chronic chest wall pain (Location of Primary Source of Pain) (Incisional Midline) (since 04/22/2012); Chronic lower extremity pain (Location of Secondary source of pain) (Bilateral) (R>L); History of MI (myocardial infarction) (January 2014); Encounter for pain management planning; Chronic sacroiliac joint pain (Bilateral) (L>R); Neuropathic pain; Neurogenic pain; Incisional pain; Elevated sedimentation rate; Vitamin D insufficiency; Lumbar facet syndrome (Bilateral) (R>L); Lumbar spondylosis; Long term current use of opiate analgesic; Long term prescription opiate use; Keloid skin disorder; Costochondritis; Unstable angina ([place]); Chest pain; Brachial plexus injury, right; Carotid atherosclerosis; Hypoalbuminemia; Hypokalemia; Right sided weakness; S/P CABG (coronary artery bypass graft); Opioid-induced constipation  ([facility]); Pneumonia; Pressure injury of skin; COPD (chronic obstructive pulmonary disease) ([place]); and Chronic pain syndrome on their problem list. His primarily concern today is the Leg Pain (lower extrem. bilateral); Back Pain (lower); and Chest Pain (from surgery)  Pain Assessment: Location:   Hip Radiating:   Onset: More than a month ago Duration: Chronic pain Quality: Aching, Discomfort Severity: 4 /10 (subjective, self-reported pain score)  Note: Reported level is compatible with observation.                          Effect on ADL: prolonged walking Timing: Constant Modifying factors: medication sometimes BP: 114/63  HR: 68  [name] [name] was last scheduled for an appointment on 02/04/2018 for medication management. During today's appointment we reviewed [name] [name] chronic pain status, as well as his outpatient medication regimen. He admits that he is having increased pain in her left hip. He denies any fall or injuries.    The patient  reports that he does not use drugs. His body mass index is 31 kg/m.  Further details on both, my assessment(s), as well as the proposed treatment plan, please see below.  Controlled Substance Pharmacotherapy Assessment REMS (Risk Evaluation and Mitigation Strategy)  Analgesic:Hydrocodone/APAP 5/325 one tablet twice a day (10 mg per day) MME/day:10 mg/day.  [name] Specking, RN  05/06/2018  2:04 PM  Sign at close encounter Nursing Pain Medication Assessment:  Safety precautions to be maintained throughout the outpatient stay will include: orient to surroundings, keep bed in low position, maintain call bell within reach at all times, provide assistance with transfer out of bed and ambulation.  Medication Inspection Compliance: Pill count conducted under aseptic conditions, in front of the patient. Neither the pills nor the bottle was removed from the patient's sight at any time. Once count was completed pills were immediately returned to the patient in  their original bottle.  Medication: Oxycodone/APAP Pill/Patch Count: 11 of 60 pills remain Pill/Patch Appearance: Markings consistent with prescribed medication Bottle Appearance: Standard pharmacy container. Clearly labeled. Filled Date: 8 / 9 / 2019 Last Medication intake:  Today   Pharmacokinetics: Liberation and absorption (onset of action): WNL Distribution (time to peak effect): WNL Metabolism and excretion (duration of action): WNL         Pharmacodynamics: Desired effects: Analgesia: [name] [name] reports >50% benefit. Functional ability: Patient reports that medication allows him to accomplish basic ADLs Clinically meaningful improvement in function (CMIF): Sustained CMIF goals met Perceived effectiveness: Described as relatively effective, allowing for increase in activities of daily living (ADL) Undesirable effects: Side-effects or Adverse reactions: None reported Monitoring: [place] PMP: Online review of the past 84-monthperiod conducted. Compliant with practice rules and regulations Last UDS on record: Summary  Date Value Ref Range Status  11/05/2017 FINAL  Final    Comment:    ==================================================================== TOXASSURE SELECT 13 (MW) ==================================================================== Test                             Result       Flag       Units Drug Present and Declared for Prescription Verification   Hydrocodone                    984          EXPECTED   ng/mg creat   Hydromorphone                  293          EXPECTED   ng/mg creat   Dihydrocodeine                 41           EXPECTED   ng/mg creat   Norhydrocodone                 483          EXPECTED   ng/mg creat    Sources of hydrocodone include scheduled prescription    medications. Hydromorphone, dihydrocodeine and norhydrocodone are    expected metabolites of hydrocodone. Hydromorphone and    dihydrocodeine are also available as scheduled prescription     medications. ==================================================================== Test                      Result    Flag   Units      Ref Range   Creatinine              205              mg/dL      >=20 ==================================================================== Declared Medications:  The flagging and interpretation on this report are based on the  following declared medications.  Unexpected results may arise from  inaccuracies in the declared medications.  **Note: The testing scope of this panel includes these medications:  Hydrocodone (Norco)  **Note: The testing scope of this panel does not include following  reported medications:  Acetaminophen (Norco)  Allopurinol  Amlodipine (Norvasc)  Aspirin (Aspirin 81)  Atorvastatin (Lipitor)  Gabapentin  Hydrochlorothiazide  Insulin (Levemir)  Isosorbide (Imdur)  Linagliptin (Tradjenta)  Losartan (Cozaar)  Metoprolol (Lopressor)  Multivitamin  Nitroglycerin  Potassium  Vitamin D3 ==================================================================== For clinical consultation, please call ([phone] ====================================================================    UDS interpretation: Compliant  Medication Assessment Form: Reviewed. Patient indicates being compliant with therapy Treatment compliance: Compliant Risk Assessment Profile: Aberrant behavior: See prior evaluations. None observed or detected today Comorbid factors increasing risk of overdose: See prior notes. No additional risks detected today Opioid risk tool (ORT) (Total Score): 0 Personal History of Substance Abuse (SUD-Substance use disorder):  Alcohol: Negative  Illegal Drugs: Negative  Rx Drugs: Negative  ORT Risk Level calculation: Low Risk Risk of substance use disorder (SUD): Low Opioid Risk Tool - 05/06/18 1401      Personal History of Substance Abuse   Alcohol  Negative    Illegal Drugs  Negative    Rx Drugs  Negative       Psychological Disease   Psychological Disease  Negative    Depression  Negative      Total Score   Opioid Risk Tool Scoring  0    Opioid Risk Interpretation  Low Risk      ORT Scoring interpretation table:  Score <3 = Low Risk for SUD  Score between 4-7 = Moderate Risk for SUD  Score >8 = High Risk for Opioid Abuse   Risk Mitigation Strategies:  Patient Counseling: Covered Patient-Prescriber Agreement (PPA): Present and active  Notification to other healthcare providers: Done  Pharmacologic Plan: No change in therapy, at this time.             Laboratory Chemistry  Inflammation Markers (CRP: Acute Phase) (ESR: Chronic Phase) Lab Results  Component Value Date   CRP <0.5 10/26/2015   ESRSEDRATE 32 (H) 10/26/2015   LATICACIDVEN 1.4 05/28/2016                         Rheumatology Markers No results found for: RF, ANA, LABURIC, URICUR, LYMEIGGIGMAB, LYMEABIGMQN, HLAB27                      Renal Function Markers Lab Results  Component Value Date   BUN 24 (H) 06/16/2017   CREATININE 1.02 06/16/2017   GFRAA >60 06/16/2017   GFRNONAA >60 06/16/2017                             Hepatic Function Markers Lab Results  Component Value Date   AST 63 (H) 12/04/2016   ALT 55 12/04/2016   ALBUMIN 4.7 12/04/2016   ALKPHOS 62 12/04/2016   LIPASE 29 03/19/2015                        Electrolytes Lab Results  Component Value Date   NA 136 06/16/2017   K 3.2 (L) 06/16/2017   CL 97 (L) 06/16/2017   CALCIUM 9.2 06/16/2017   MG 2.0 10/26/2015                        Neuropathy Markers Lab Results  Component Value Date   HGBA1C 6.1 04/17/2012                        Bone Pathology Markers Lab Results  Component Value Date   VD25OH 26.5 (L) 10/26/2015   VD125OH2TOT 30.8 10/26/2015                         Coagulation Parameters Lab Results  Component Value Date   INR 0.97 02/09/2015   LABPROT 13.1 02/09/2015  APTT > 160.0 (HH) 04/21/2012   PLT 192 06/16/2017                         Cardiovascular Markers Lab Results  Component Value Date   BNP 255 (H) 08/01/2014   CKTOTAL 82 05/16/2013   CKMB 1.4 05/16/2013   TROPONINI <0.03 06/16/2017   HGB 13.9 06/16/2017   HCT 39.6 (L) 06/16/2017                         CA Markers No results found for: CEA, CA125, LABCA2                      Note: Lab results reviewed.  Recent Diagnostic Imaging Results  DG Chest 2 View CLINICAL DATA:  Acute onset of generalized chest pain. High blood pressure. Initial encounter.  EXAM: CHEST  2 VIEW  COMPARISON:  Chest radiograph performed 09/12/2016  FINDINGS: The lungs are well-aerated. Pulmonary vascularity is at the upper limits of normal. There is no evidence of focal opacification, pleural effusion or pneumothorax.  The heart is borderline normal in size. The patient is status post median sternotomy, with evidence of prior CABG. No acute osseous abnormalities are seen. Clips are noted at the right axilla.  IMPRESSION: No acute cardiopulmonary process seen.  Electronically Signed   By: Garald Balding M.D.   On: 06/16/2017 03:11  Complexity Note: Imaging results reviewed. Results shared with Mr. Cheatum, using Layman's terms.                         Meds   Current Outpatient Medications:  .  allopurinol (ZYLOPRIM) 300 MG tablet, Take 100 mg by mouth daily. *patient takes in the afternoon*, Disp: , Rfl:  .  amLODipine (NORVASC) 10 MG tablet, Take 5 mg by mouth daily. , Disp: , Rfl:  .  aspirin EC 81 MG tablet, Take 81 mg by mouth every morning., Disp: , Rfl:  .  atorvastatin (LIPITOR) 40 MG tablet, Take 40 mg by mouth at bedtime. , Disp: , Rfl:  .  Cholecalciferol (VITAMIN D3) 2000 units TABS, Take 2,000 Units by mouth every morning., Disp: , Rfl:  .  hydrochlorothiazide (HYDRODIURIL) 25 MG tablet, Take 25 mg by mouth daily. , Disp: , Rfl:  .  Insulin Detemir (LEVEMIR FLEXTOUCH) 100 UNIT/ML Pen, Inject 46 Units into the skin 2 (two) times daily.  , Disp: , Rfl:  .  isosorbide mononitrate (IMDUR) 30 MG 24 hr tablet, Take 30 mg by mouth every morning. , Disp: , Rfl: 1 .  linagliptin (TRADJENTA) 5 MG TABS tablet, Take 5 mg by mouth daily., Disp: , Rfl:  .  losartan (COZAAR) 100 MG tablet, take 1 tablet by mouth once daily, Disp: , Rfl:  .  metoprolol (LOPRESSOR) 50 MG tablet, Take 50 mg by mouth 3 (three) times daily., Disp: , Rfl:  .  Multiple Vitamin (MULTIVITAMIN WITH MINERALS) TABS tablet, Take 1 tablet by mouth every evening., Disp: , Rfl:  .  nitroGLYCERIN (NITROSTAT) 0.4 MG SL tablet, Place 0.4 mg under the tongue every 5 (five) minutes x 3 doses as needed for chest pain. , Disp: , Rfl:  .  Potassium 95 MG TABS, Take 95 mg by mouth every morning. , Disp: , Rfl:  .  [START ON 05/11/2018] gabapentin (NEURONTIN) 600 MG tablet, Take 1 tablet (600 mg total) by mouth 2 (  two) times daily., Disp: 180 tablet, Rfl: 0 .  [START ON 07/10/2018] HYDROcodone-acetaminophen (NORCO/VICODIN) 5-325 MG tablet, Take 1 tablet by mouth 2 (two) times daily as needed for severe pain., Disp: 60 tablet, Rfl: 0 .  [START ON 06/10/2018] HYDROcodone-acetaminophen (NORCO/VICODIN) 5-325 MG tablet, Take 1 tablet by mouth 2 (two) times daily as needed for severe pain., Disp: 60 tablet, Rfl: 0 .  [START ON 05/11/2018] HYDROcodone-acetaminophen (NORCO/VICODIN) 5-325 MG tablet, Take 1 tablet by mouth 2 (two) times daily as needed for severe pain., Disp: 60 tablet, Rfl: 0  ROS  Constitutional: Denies any fever or chills Gastrointestinal: No reported hemesis, hematochezia, vomiting, or acute GI distress Musculoskeletal: Denies any acute onset joint swelling, redness, loss of ROM, or weakness Neurological: No reported episodes of acute onset apraxia, aphasia, dysarthria, agnosia, amnesia, paralysis, loss of coordination, or loss of consciousness  Allergies  Mr. Sones has No Known Allergies.  PFSH  Drug: Mr. Culbertson  reports that he does not use drugs. Alcohol:  reports that  he drinks alcohol. Tobacco:  reports that he has quit smoking. He quit after 25.00 years of use. He has quit using smokeless tobacco. Medical:  has a past medical history of Benign esophageal stricture, Cardiomyopathy, secondary ([place]), Coronary artery disease, Diverticulitis, GI bleed, Gout, Hemorrhoids, Hyperlipidemia, Hypertension, Sleep apnea, and Tubular adenoma of colon. Surgical: Mr. Tigges  has a past surgical history that includes Coronary angioplasty with stent; cardiac bypass; Cardiac surgery; Coronary artery bypass graft; Colonoscopy with propofol (N/A, 04/29/2015); Esophagogastroduodenoscopy (egd) with propofol (N/A, 04/29/2015); Septoplasty; and Cardiac catheterization (Left, 02/03/2016). Family: family history includes Heart disease in his mother; Hypertension in his father.  Constitutional Exam  General appearance: Well nourished, well developed, and well hydrated. In no apparent acute distress Vitals:   05/06/18 1340  BP: 114/63  Pulse: 67  Resp: 16  Temp: 97.9 F (36.6 C)  SpO2: 99%  Weight: 175 lb (79.4 kg)  Height: '5\' 3"'  (1.6 m)  Psych/Mental status: Alert, oriented x 3 (person, place, & time)       Eyes: PERLA Respiratory: No evidence of acute respiratory distress  Cervical Spine Area Exam  Skin & Axial Inspection: No masses, redness, edema, swelling, or associated skin lesions Alignment: Symmetrical Functional ROM: Unrestricted ROM      Stability: No instability detected Muscle Tone/Strength: Functionally intact. No obvious neuro-muscular anomalies detected. Sensory (Neurological): Unimpaired Palpation: No palpable anomalies              Upper Extremity (UE) Exam    Side: Right upper extremity  Side: Left upper extremity  Skin & Extremity Inspection: Skin color, temperature, and hair growth are WNL. No peripheral edema or cyanosis. No masses, redness, swelling, asymmetry, or associated skin lesions. No contractures.  Skin & Extremity Inspection: Skin color,  temperature, and hair growth are WNL. No peripheral edema or cyanosis. No masses, redness, swelling, asymmetry, or associated skin lesions. No contractures.  Functional ROM: Unrestricted ROM          Functional ROM: Unrestricted ROM          Muscle Tone/Strength: Functionally intact. No obvious neuro-muscular anomalies detected.  Muscle Tone/Strength: Functionally intact. No obvious neuro-muscular anomalies detected.  Sensory (Neurological): Unimpaired          Sensory (Neurological): Unimpaired          Palpation: No palpable anomalies              Palpation: No palpable anomalies  Provocative Test(s):  Phalen's test: deferred Tinel's test: deferred Apley's scratch test (touch opposite shoulder):  Action 1 (Across chest): deferred Action 2 (Overhead): deferred Action 3 (LB reach): deferred   Provocative Test(s):  Phalen's test: deferred Tinel's test: deferred Apley's scratch test (touch opposite shoulder):  Action 1 (Across chest): deferred Action 2 (Overhead): deferred Action 3 (LB reach): deferred    Thoracic Spine Area Exam  Skin & Axial Inspection: No masses, redness, or swelling Alignment: Symmetrical Functional ROM: Unrestricted ROM Stability: No instability detected Muscle Tone/Strength: Functionally intact. No obvious neuro-muscular anomalies detected. Sensory (Neurological): Unimpaired Muscle strength & Tone: No palpable anomalies  Lumbar Spine Area Exam  Skin & Axial Inspection: No masses, redness, or swelling Alignment: Symmetrical Functional ROM: Unrestricted ROM       Stability: No instability detected Muscle Tone/Strength: Functionally intact. No obvious neuro-muscular anomalies detected. Sensory (Neurological): Unimpaired Palpation: No palpable anomalies       Provocative Tests: Hyperextension/rotation test: deferred today       Lumbar quadrant test (Kemp's test): deferred today       Lateral bending test: deferred today       Patrick's Maneuver:  deferred today                   FABER test: deferred today                   S-I anterior distraction/compression test: deferred today         S-I lateral compression test: deferred today         S-I Thigh-thrust test: deferred today         S-I Gaenslen's test: deferred today          Gait & Posture Assessment  Ambulation: Unassisted Gait: Relatively normal for age and body habitus Posture: WNL   Lower Extremity Exam    Side: Right lower extremity  Side: Left lower extremity  Stability: No instability observed          Stability: No instability observed          Skin & Extremity Inspection: Skin color, temperature, and hair growth are WNL. No peripheral edema or cyanosis. No masses, redness, swelling, asymmetry, or associated skin lesions. No contractures.  Skin & Extremity Inspection: Skin color, temperature, and hair growth are WNL. No peripheral edema or cyanosis. No masses, redness, swelling, asymmetry, or associated skin lesions. No contractures.  Functional ROM: Unrestricted ROM                  Functional ROM: Unrestricted ROM                  Muscle Tone/Strength: Functionally intact. No obvious neuro-muscular anomalies detected.  Muscle Tone/Strength: Functionally intact. No obvious neuro-muscular anomalies detected.  Sensory (Neurological): Unimpaired  Sensory (Neurological): Unimpaired  Palpation: No palpable anomalies  Palpation: No palpable anomalies   Assessment  Primary Diagnosis & Pertinent Problem List: The primary encounter diagnosis was Chronic hip pain (Left). Diagnoses of Lumbar spondylosis, Chronic lower extremity pain (Location of Secondary source of pain) (Bilateral) (R>L), Chronic sacroiliac joint pain (Bilateral) (L>R), Neurogenic pain, Long term current use of opiate analgesic, and Chronic pain syndrome were also pertinent to this visit.  Status Diagnosis  Persistent Persistent Persistent 1. Chronic hip pain (Left)   2. Lumbar spondylosis   3. Chronic  lower extremity pain (Location of Secondary source of pain) (Bilateral) (R>L)   4. Chronic sacroiliac joint pain (Bilateral) (L>R)   5.  Neurogenic pain   6. Long term current use of opiate analgesic   7. Chronic pain syndrome     Problems updated and reviewed during this visit: No problems updated. Plan of Care  Pharmacotherapy (Medications Ordered): Meds ordered this encounter  Medications  . gabapentin (NEURONTIN) 600 MG tablet    Sig: Take 1 tablet (600 mg total) by mouth 2 (two) times daily.    Dispense:  180 tablet    Refill:  0    Do not add this medication to the electronic "Automatic Refill" notification system. Patient may have prescription filled one day early if pharmacy is closed on scheduled refill date.    Order Specific Question:   Supervising Provider    Answer:   Milinda Pointer [phone]  . HYDROcodone-acetaminophen (NORCO/VICODIN) 5-325 MG tablet    Sig: Take 1 tablet by mouth 2 (two) times daily as needed for severe pain.    Dispense:  60 tablet    Refill:  0    Patient may have prescription filled one day early if pharmacy is closed on scheduled refill date. Do not fill until:07/10/2018 To last until:08/09/2018    Order Specific Question:   Supervising Provider    Answer:   Milinda Pointer [phone]  . HYDROcodone-acetaminophen (NORCO/VICODIN) 5-325 MG tablet    Sig: Take 1 tablet by mouth 2 (two) times daily as needed for severe pain.    Dispense:  60 tablet    Refill:  0    Patient may have prescription filled one day early if pharmacy is closed on scheduled refill date. Do not fill until: 06/10/2018 To last until:07/10/2018    Order Specific Question:   Supervising Provider    Answer:   Milinda Pointer [phone]  . HYDROcodone-acetaminophen (NORCO/VICODIN) 5-325 MG tablet    Sig: Take 1 tablet by mouth 2 (two) times daily as needed for severe pain.    Dispense:  60 tablet    Refill:  0    Patient may have prescription filled one day early if pharmacy is  closed on scheduled refill date. Do not fill until: 05/11/2018 To last until:06/10/2018    Order Specific Question:   Supervising Provider    Answer:   Milinda Pointer [phone]   New Prescriptions   No medications on file   Medications administered today: Margaretha Seeds. "Tim" had no medications administered during this visit. Lab-work, procedure(s), and/or referral(s): Orders Placed This Encounter  Procedures  . HIP INJECTION  . ToxASSURE Select 13 (MW), Urine   Imaging and/or referral(s): None  Interventional therapies: Planned, scheduled, and/or pending: None at this time.   Considering: Lumbar facet and sacroiliac joint radiofrequency ablation.   Palliative PRN treatment(s): Diagnostic left intra-articular hip joint injection under fluoroscopic guidance, with or without sedation    Provider-requested follow-up: Return in about 3 months (around 08/05/2018) for MedMgmt with Me Dionisio David), in addition, Procedure(NS), w/ Dr. Dossie Arbour.  Future Appointments  Date Time Provider [place]  05/13/2018  1:15 PM Milinda Pointer, MD [facility] None  08/05/2018 11:00 AM Vevelyn Francois, NP [facility] None   Primary Care Physician: Elisabeth Cara, NP Location: [facility] Note by: Vevelyn Francois NP Date: 05/06/2018; Time: 4:05 PM  Pain Score Disclaimer: We use the NRS-11 scale. This is a self-reported, subjective measurement of pain severity with only modest accuracy. It is used primarily to identify changes within a particular patient. It must be understood that outpatient pain scales are significantly less accurate that  those used for research, where they can be applied under ideal controlled circumstances with minimal exposure to variables. In reality, the score is likely to be a combination of pain intensity and pain affect, where pain affect describes the degree of emotional arousal or changes in action readiness caused by the  sensory experience of pain. Factors such as social and work situation, setting, emotional state, anxiety levels, expectation, and prior pain experience may influence pain perception and show large inter-individual differences that may also be affected by time variables.  Patient instructions provided during this appointment: Patient Instructions  ____________________________________________________________________________________________  Medication Rules  Applies to: All patients receiving prescriptions (written or electronic).  Pharmacy of record: Pharmacy where electronic prescriptions will be sent. If written prescriptions are taken to a different pharmacy, please inform the nursing staff. The pharmacy listed in the electronic medical record should be the one where you would like electronic prescriptions to be sent.  Prescription refills: Only during scheduled appointments. Applies to both, written and electronic prescriptions.  NOTE: The following applies primarily to controlled substances (Opioid* Pain Medications).   Patient's responsibilities: 1. Pain Pills: Bring all pain pills to every appointment (except for procedure appointments). 2. Pill Bottles: Bring pills in original pharmacy bottle. Always bring newest bottle. Bring bottle, even if empty. 3. Medication refills: You are responsible for knowing and keeping track of what medications you need refilled. The day before your appointment, write a list of all prescriptions that need to be refilled. Bring that list to your appointment and give it to the admitting nurse. Prescriptions will be written only during appointments. If you forget a medication, it will not be "Called in", "Faxed", or "electronically sent". You will need to get another appointment to get these prescribed. 4. Prescription Accuracy: You are responsible for carefully inspecting your prescriptions before leaving our office. Have the discharge nurse carefully go over  each prescription with you, before taking them home. Make sure that your name is accurately spelled, that your address is correct. Check the name and dose of your medication to make sure it is accurate. Check the number of pills, and the written instructions to make sure they are clear and accurate. Make sure that you are given enough medication to last until your next medication refill appointment. 5. Taking Medication: Take medication as prescribed. Never take more pills than instructed. Never take medication more frequently than prescribed. Taking less pills or less frequently is permitted and encouraged, when it comes to controlled substances (written prescriptions).  6. Inform other Doctors: Always inform, all of your healthcare providers, of all the medications you take. 7. Pain Medication from other Providers: You are not allowed to accept any additional pain medication from any other Doctor or Healthcare provider. There are two exceptions to this rule. (see below) In the event that you require additional pain medication, you are responsible for notifying us, as stated below. 8. Medication Agreement: You are responsible for carefully reading and following our Medication Agreement. This must be signed before receiving any prescriptions from our practice. Safely store a copy of your signed Agreement. Violations to the Agreement will result in no further prescriptions. (Additional copies of our Medication Agreement are available upon request.) 9. Laws, Rules, & Regulations: All patients are expected to follow all Federal and [employer], Rules, [employer]. Ignorance of the Laws does not constitute a valid excuse. The use of any illegal substances is prohibited. 10. Adopted CDC guidelines & recommendations: Target dosing levels will be  at or below 60 MME/day. Use of benzodiazepines** is not recommended.  Exceptions: There are only two exceptions to the rule of not receiving pain medications  from other Healthcare Providers. 1. Exception #1 (Emergencies): In the event of an emergency (i.e.: accident requiring emergency care), you are allowed to receive additional pain medication. However, you are responsible for: As soon as you are able, call our office [phone], at any time of the day or night, and leave a message stating your name, the date and nature of the emergency, and the name and dose of the medication prescribed. In the event that your call is answered by a member of our staff, make sure to document and save the date, time, and the name of the person that took your information.  2. Exception #2 (Planned Surgery): In the event that you are scheduled by another doctor or dentist to have any type of surgery or procedure, you are allowed (for a period no longer than 30 days), to receive additional pain medication, for the acute post-op pain. However, in this case, you are responsible for picking up a copy of our "Post-op Pain Management for Surgeons" handout, and giving it to your surgeon or dentist. This document is available at our office, and does not require an appointment to obtain it. Simply go to our office during business hours (Monday-Thursday from 8:00 AM to 4:00 PM) (Friday 8:00 AM to 12:00 Noon) or if you have a scheduled appointment with [place], prior to your surgery, and ask for it by name. In addition, you will need to provide [place] with your name, name of your surgeon, type of surgery, and date of procedure or surgery.  *Opioid medications include: morphine, codeine, oxycodone, oxymorphone, hydrocodone, hydromorphone, meperidine, tramadol, tapentadol, buprenorphine, fentanyl, methadone. **Benzodiazepine medications include: diazepam (Valium), alprazolam (Xanax), clonazepam (Klonopine), lorazepam (Ativan), clorazepate (Tranxene), chlordiazepoxide (Librium), estazolam (Prosom), oxazepam (Serax), temazepam (Restoril), triazolam (Halcion) (Last updated:  10/31/2017) ____________________________________________________________________________________________   Preparing for your procedure (without sedation) Instructions: . Oral Intake: Do not eat or drink anything for at least 3 hours prior to your procedure. . Transportation: Unless otherwise stated by your physician, you may drive yourself after the procedure. . Blood Pressure Medicine: Take your blood pressure medicine with a sip of water the morning of the procedure. . Insulin: Take only  of your normal insulin dose. . Preventing infections: Shower with an antibacterial soap the morning of your procedure. . Build-up your immune system: Take 1000 mg of Vitamin C with every meal (3 times a day) the day prior to your procedure. . Pregnancy: If you are pregnant, call and cancel the procedure. . Sickness: If you have a cold, fever, or any active infections, call and cancel the procedure. . Arrival: You must be in the facility at least 30 minutes prior to your scheduled procedure. . Children: Do not bring any children with you. . Dress appropriately: Bring dark clothing that you would not mind if they get stained. . Valuables: Do not bring any jewelry or valuables. Procedure appointments are reserved for interventional treatments only. Marland Kitchen No Prescription Refills. . No medication changes will be discussed during procedure appointments. . No disability issues will be discussed.

## 2018-05-06 NOTE — Patient Instructions (Addendum)
____________________________________________________________________________________________  Medication Rules  Applies to: All patients receiving prescriptions (written or electronic).  Pharmacy of record: Pharmacy where electronic prescriptions will be sent. If written prescriptions are taken to a different pharmacy, please inform the nursing staff. The pharmacy listed in the electronic medical record should be the one where you would like electronic prescriptions to be sent.  Prescription refills: Only during scheduled appointments. Applies to both, written and electronic prescriptions.  NOTE: The following applies primarily to controlled substances (Opioid* Pain Medications).   Patient's responsibilities: 1. Pain Pills: Bring all pain pills to every appointment (except for procedure appointments). 2. Pill Bottles: Bring pills in original pharmacy bottle. Always bring newest bottle. Bring bottle, even if empty. 3. Medication refills: You are responsible for knowing and keeping track of what medications you need refilled. The day before your appointment, write a list of all prescriptions that need to be refilled. Bring that list to your appointment and give it to the admitting nurse. Prescriptions will be written only during appointments. If you forget a medication, it will not be "Called in", "Faxed", or "electronically sent". You will need to get another appointment to get these prescribed. 4. Prescription Accuracy: You are responsible for carefully inspecting your prescriptions before leaving our office. Have the discharge nurse carefully go over each prescription with you, before taking them home. Make sure that your name is accurately spelled, that your address is correct. Check the name and dose of your medication to make sure it is accurate. Check the number of pills, and the written instructions to make sure they are clear and accurate. Make sure that you are given enough medication to last  until your next medication refill appointment. 5. Taking Medication: Take medication as prescribed. Never take more pills than instructed. Never take medication more frequently than prescribed. Taking less pills or less frequently is permitted and encouraged, when it comes to controlled substances (written prescriptions).  6. Inform other Doctors: Always inform, all of your healthcare providers, of all the medications you take. 7. Pain Medication from other Providers: You are not allowed to accept any additional pain medication from any other Doctor or Healthcare provider. There are two exceptions to this rule. (see below) In the event that you require additional pain medication, you are responsible for notifying us, as stated below. 8. Medication Agreement: You are responsible for carefully reading and following our Medication Agreement. This must be signed before receiving any prescriptions from our practice. Safely store a copy of your signed Agreement. Violations to the Agreement will result in no further prescriptions. (Additional copies of our Medication Agreement are available upon request.) 9. Laws, Rules, & Regulations: All patients are expected to follow all Federal and State Laws, Statutes, Rules, & Regulations. Ignorance of the Laws does not constitute a valid excuse. The use of any illegal substances is prohibited. 10. Adopted CDC guidelines & recommendations: Target dosing levels will be at or below 60 MME/day. Use of benzodiazepines** is not recommended.  Exceptions: There are only two exceptions to the rule of not receiving pain medications from other Healthcare Providers. 1. Exception #1 (Emergencies): In the event of an emergency (i.e.: accident requiring emergency care), you are allowed to receive additional pain medication. However, you are responsible for: As soon as you are able, call our office [phone], at any time of the day or night, and leave a message stating your name, the  date and nature of the emergency, and the name and dose of the medication   prescribed. In the event that your call is answered by a member of our staff, make sure to document and save the date, time, and the name of the person that took your information.  2. Exception #2 (Planned Surgery): In the event that you are scheduled by another doctor or dentist to have any type of surgery or procedure, you are allowed (for a period no longer than 30 days), to receive additional pain medication, for the acute post-op pain. However, in this case, you are responsible for picking up a copy of our "Post-op Pain Management for Surgeons" handout, and giving it to your surgeon or dentist. This document is available at our office, and does not require an appointment to obtain it. Simply go to our office during business hours (Monday-Thursday from 8:00 AM to 4:00 PM) (Friday 8:00 AM to 12:00 Noon) or if you have a scheduled appointment with [place], prior to your surgery, and ask for it by name. In addition, you will need to provide [place] with your name, name of your surgeon, type of surgery, and date of procedure or surgery.  *Opioid medications include: morphine, codeine, oxycodone, oxymorphone, hydrocodone, hydromorphone, meperidine, tramadol, tapentadol, buprenorphine, fentanyl, methadone. **Benzodiazepine medications include: diazepam (Valium), alprazolam (Xanax), clonazepam (Klonopine), lorazepam (Ativan), clorazepate (Tranxene), chlordiazepoxide (Librium), estazolam (Prosom), oxazepam (Serax), temazepam (Restoril), triazolam (Halcion) (Last updated: 10/31/2017) ____________________________________________________________________________________________   Preparing for your procedure (without sedation) Instructions: . Oral Intake: Do not eat or drink anything for at least 3 hours prior to your procedure. . Transportation: Unless otherwise stated by your physician, you may drive yourself after the procedure. . Blood Pressure  Medicine: Take your blood pressure medicine with a sip of water the morning of the procedure. . Insulin: Take only  of your normal insulin dose. . Preventing infections: Shower with an antibacterial soap the morning of your procedure. . Build-up your immune system: Take 1000 mg of Vitamin C with every meal (3 times a day) the day prior to your procedure. . Pregnancy: If you are pregnant, call and cancel the procedure. . Sickness: If you have a cold, fever, or any active infections, call and cancel the procedure. . Arrival: You must be in the facility at least 30 minutes prior to your scheduled procedure. . Children: Do not bring any children with you. . Dress appropriately: Bring dark clothing that you would not mind if they get stained. . Valuables: Do not bring any jewelry or valuables. Procedure appointments are reserved for interventional treatments only. Marland Kitchen No Prescription Refills. . No medication changes will be discussed during procedure appointments. . No disability issues will be discussed.

## 2018-05-06 NOTE — Progress Notes (Signed)
Nursing Pain Medication Assessment:  Safety precautions to be maintained throughout the outpatient stay will include: orient to surroundings, keep bed in low position, maintain call bell within reach at all times, provide assistance with transfer out of bed and ambulation.  Medication Inspection Compliance: Pill count conducted under aseptic conditions, in front of the patient. Neither the pills nor the bottle was removed from the patient's sight at any time. Once count was completed pills were immediately returned to the patient in their original bottle.  Medication: Oxycodone/APAP Pill/Patch Count: 11 of 60 pills remain Pill/Patch Appearance: Markings consistent with prescribed medication Bottle Appearance: Standard pharmacy container. Clearly labeled. Filled Date: 8 / 9 / 2019 Last Medication intake:  Today

## 2018-05-10 LAB — TOXASSURE SELECT 13 (MW), URINE

## 2018-05-13 ENCOUNTER — Encounter: Payer: Self-pay | Admitting: Pain Medicine

## 2018-05-13 ENCOUNTER — Other Ambulatory Visit: Payer: Self-pay

## 2018-05-13 ENCOUNTER — Ambulatory Visit
Admission: RE | Admit: 2018-05-13 | Discharge: 2018-05-13 | Disposition: A | Discharge status: Home / Self Care | Payer: Medicare Other | Source: Ambulatory Visit | Attending: Pain Medicine | Admitting: Pain Medicine

## 2018-05-13 ENCOUNTER — Ambulatory Visit (HOSPITAL_BASED_OUTPATIENT_CLINIC_OR_DEPARTMENT_OTHER): Payer: Medicare Other | Admitting: Pain Medicine

## 2018-05-13 VITALS — BP 116/87 | HR 63 | Temp 97.7°F | Resp 22 | Ht 63.0 in | Wt 175.0 lb

## 2018-05-13 DIAGNOSIS — Z794 Long term (current) use of insulin: Secondary | ICD-10-CM | POA: Insufficient documentation

## 2018-05-13 DIAGNOSIS — Z79899 Other long term (current) drug therapy: Secondary | ICD-10-CM | POA: Diagnosis not present

## 2018-05-13 DIAGNOSIS — G8929 Other chronic pain: Secondary | ICD-10-CM | POA: Insufficient documentation

## 2018-05-13 DIAGNOSIS — M25552 Pain in left hip: Secondary | ICD-10-CM | POA: Insufficient documentation

## 2018-05-13 DIAGNOSIS — Z7982 Long term (current) use of aspirin: Secondary | ICD-10-CM | POA: Diagnosis not present

## 2018-05-13 DIAGNOSIS — Z79891 Long term (current) use of opiate analgesic: Secondary | ICD-10-CM | POA: Diagnosis not present

## 2018-05-13 DIAGNOSIS — M47816 Spondylosis without myelopathy or radiculopathy, lumbar region: Secondary | ICD-10-CM | POA: Insufficient documentation

## 2018-05-13 DIAGNOSIS — M79604 Pain in right leg: Secondary | ICD-10-CM | POA: Diagnosis not present

## 2018-05-13 DIAGNOSIS — M5137 Other intervertebral disc degeneration, lumbosacral region: Secondary | ICD-10-CM | POA: Insufficient documentation

## 2018-05-13 DIAGNOSIS — M4310 Spondylolisthesis, site unspecified: Secondary | ICD-10-CM | POA: Insufficient documentation

## 2018-05-13 DIAGNOSIS — M79605 Pain in left leg: Secondary | ICD-10-CM | POA: Diagnosis not present

## 2018-05-13 DIAGNOSIS — Z951 Presence of aortocoronary bypass graft: Secondary | ICD-10-CM | POA: Diagnosis not present

## 2018-05-13 DIAGNOSIS — Z955 Presence of coronary angioplasty implant and graft: Secondary | ICD-10-CM | POA: Diagnosis not present

## 2018-05-13 MED ORDER — LIDOCAINE HCL 2 % IJ SOLN
20.0000 mL | Freq: Once | INTRAMUSCULAR | Status: AC
  Administered 2018-05-13: 400 mg
  Filled 2018-05-13: qty 20

## 2018-05-13 MED ORDER — IOPAMIDOL (ISOVUE-M 200) INJECTION 41%
10.0000 mL | Freq: Once | INTRAMUSCULAR | Status: AC
  Administered 2018-05-13: 10 mL via EPIDURAL
  Filled 2018-05-13: qty 10

## 2018-05-13 MED ORDER — METHYLPREDNISOLONE ACETATE 80 MG/ML IJ SUSP
80.0000 mg | Freq: Once | INTRAMUSCULAR | Status: AC
  Administered 2018-05-13: 80 mg via INTRA_ARTICULAR
  Filled 2018-05-13: qty 1

## 2018-05-13 MED ORDER — ROPIVACAINE HCL 2 MG/ML IJ SOLN
4.0000 mL | Freq: Once | INTRAMUSCULAR | Status: AC
  Administered 2018-05-13: 4 mL via INTRA_ARTICULAR
  Filled 2018-05-13: qty 10

## 2018-05-13 NOTE — Progress Notes (Signed)
Safety precautions to be maintained throughout the outpatient stay will include: orient to surroundings, keep bed in low position, maintain call bell within reach at all times, provide assistance with transfer out of bed and ambulation.  

## 2018-05-13 NOTE — Progress Notes (Signed)
Patient's Name: [name] [name].  MRN: [id number]  Referring Provider: Vevelyn Francois, NP  DOB: Aug 02, 1956  PCP: Elisabeth Cara, NP  DOS: 05/13/2018  Note by: Gaspar Cola, MD  Service setting: Ambulatory outpatient  Specialty: Interventional Pain Management  Patient type: Established  Location: [facility] (AMB) Pain Management Facility  Visit type: Interventional Procedure   Primary Reason for Visit: Interventional Pain Management Treatment. CC: Hip Pain (left)  Procedure:          Anesthesia, Analgesia, Anxiolysis:  Type: Intra-Articular Hip Injection #1  Primary Purpose: Diagnostic Region: Posterolateral hip joint area. Level: Lower pelvic and hip joint level. Target Area: Superior aspect of the hip joint cavity, going thru the superior portion of the capsular ligament. Approach: Posterolateral approach. Laterality: Left-Sided  Type: Local Anesthesia Indication(s): Analgesia         Route: Infiltration ([place]/IM) IV Access: Declined Sedation: Declined  Local Anesthetic: Lidocaine 1-2%  Position: Right Lateral Decubitus. Prepped Area: Entire Posterolateral hip area. Prepping solution: ChloraPrep (2% chlorhexidine gluconate and 70% isopropyl alcohol)   Indications: 1. Chronic hip pain (Left)   2. Chronic lower extremity pain (Location of Secondary source of pain) (Bilateral) (R>L)    Pain Score: Pre-procedure: 4 /10 Post-procedure: 0-No pain/10  Pre-op Assessment:  [name] [name] is a 62 y.o. (year old), male patient, seen today for interventional treatment. He  has a past surgical history that includes Coronary angioplasty with stent; cardiac bypass; Cardiac surgery; Coronary artery bypass graft; Colonoscopy with propofol (N/A, 04/29/2015); Esophagogastroduodenoscopy (egd) with propofol (N/A, 04/29/2015); Septoplasty; and Cardiac catheterization (Left, 02/03/2016). [name] [name] has a current medication list which includes the following prescription(s): allopurinol, amlodipine, aspirin  ec, atorvastatin, vitamin d3, gabapentin, hydrochlorothiazide, hydrocodone-acetaminophen, hydrocodone-acetaminophen, hydrocodone-acetaminophen, insulin detemir, isosorbide mononitrate, linagliptin, losartan, metoprolol tartrate, multivitamin with minerals, nitroglycerin, and potassium. His primarily concern today is the Hip Pain (left)  Initial Vital Signs:  Pulse/HCG Rate: 63ECG Heart Rate: 68 Temp: 97.7 F (36.5 C) Resp: 16 BP: 126/74 SpO2: 96 %  BMI: Estimated body mass index is 31 kg/m as calculated from the following:   Height as of this encounter: 5\' 3"  (1.6 m).   Weight as of this encounter: 175 lb (79.4 kg).  Risk Assessment: Allergies: Reviewed. He has No Known Allergies.  Allergy Precautions: None required Coagulopathies: Reviewed. None identified.  Blood-thinner therapy: None at this time Active Infection(s): Reviewed. None identified. [name] [name] is afebrile  Site Confirmation: [name] [name] was asked to confirm the procedure and laterality before marking the site Procedure checklist: Completed Consent: Before the procedure and under the influence of no sedative(s), amnesic(s), or anxiolytics, the patient was informed of the treatment options, risks and possible complications. To fulfill our ethical and legal obligations, as recommended by the American Medical Association's Code of Ethics, I have informed the patient of my clinical impression; the nature and purpose of the treatment or procedure; the risks, benefits, and possible complications of the intervention; the alternatives, including doing nothing; the risk(s) and benefit(s) of the alternative treatment(s) or procedure(s); and the risk(s) and benefit(s) of doing nothing. The patient was provided information about the general risks and possible complications associated with the procedure. These may include, but are not limited to: failure to achieve desired goals, infection, bleeding, organ or nerve damage, allergic  reactions, paralysis, and death. In addition, the patient was informed of those risks and complications associated to the procedure, such as failure to decrease pain; infection; bleeding; organ or nerve damage with subsequent damage to sensory,  motor, and/or autonomic systems, resulting in permanent pain, numbness, and/or weakness of one or several areas of the body; allergic reactions; (i.e.: anaphylactic reaction); and/or death. Furthermore, the patient was informed of those risks and complications associated with the medications. These include, but are not limited to: allergic reactions (i.e.: anaphylactic or anaphylactoid reaction(s)); adrenal axis suppression; blood sugar elevation that in diabetics may result in ketoacidosis or comma; water retention that in patients with history of congestive heart failure may result in shortness of breath, pulmonary edema, and decompensation with resultant heart failure; weight gain; swelling or edema; medication-induced neural toxicity; particulate matter embolism and blood vessel occlusion with resultant organ, and/or nervous system infarction; and/or aseptic necrosis of one or more joints. Finally, the patient was informed that Medicine is not an exact science; therefore, there is also the possibility of unforeseen or unpredictable risks and/or possible complications that may result in a catastrophic outcome. The patient indicated having understood very clearly. We have given the patient no guarantees and we have made no promises. Enough time was given to the patient to ask questions, all of which were answered to the patient's satisfaction. [name] [name] has indicated that he wanted to continue with the procedure. Attestation: I, the ordering provider, attest that I have discussed with the patient the benefits, risks, side-effects, alternatives, likelihood of achieving goals, and potential problems during recovery for the procedure that I have provided informed  consent. Date  Time: 05/13/2018  1:03 PM  Pre-Procedure Preparation:  Monitoring: As per clinic protocol. Respiration, ETCO2, SpO2, BP, heart rate and rhythm monitor placed and checked for adequate function Safety Precautions: Patient was assessed for positional comfort and pressure points before starting the procedure. Time-out: I initiated and conducted the "Time-out" before starting the procedure, as per protocol. The patient was asked to participate by confirming the accuracy of the "Time Out" information. Verification of the correct person, site, and procedure were performed and confirmed by me, the nursing staff, and the patient. "Time-out" conducted as per Joint Commission's Universal Protocol (UP.01.01.01). Time: 1332  Description of Procedure:          Safety Precautions: Aspiration looking for blood return was conducted prior to all injections. At no point did we inject any substances, as a needle was being advanced. No attempts were made at seeking any paresthesias. Safe injection practices and needle disposal techniques used. Medications properly checked for expiration dates. SDV (single dose vial) medications used. Description of the Procedure: Protocol guidelines were followed. The patient was placed in position over the fluoroscopy table. The target area was identified and the area prepped in the usual manner. Skin & deeper tissues infiltrated with local anesthetic. Appropriate amount of time allowed to pass for local anesthetics to take effect. The procedure needles were then advanced to the target area. Proper needle placement secured. Negative aspiration confirmed. Solution injected in intermittent fashion, asking for systemic symptoms every 0.5cc of injectate. The needles were then removed and the area cleansed, making sure to leave some of the prepping solution back to take advantage of its long term bactericidal properties. Vitals:   05/13/18 1301 05/13/18 1328 05/13/18 1334  05/13/18 1339  BP: 126/74 125/74 122/79 116/87  Pulse: 63     Resp: 16 16 12  (!) 22  Temp: 97.7 F (36.5 C)     TempSrc: Oral     SpO2: 96% 95% 97% 96%  Weight: 175 lb (79.4 kg)     Height: 5\' 3"  (1.6 m)  Start Time: 1332 hrs. End Time: 1337 hrs. Materials:  Needle(s) Type: Regular needle Gauge: 22G Length: 3.5-in Medication(s): Please see orders for medications and dosing details.  Imaging Guidance (Non-Spinal):          Type of Imaging Technique: Fluoroscopy Guidance (Non-Spinal) Indication(s): Assistance in needle guidance and placement for procedures requiring needle placement in or near specific anatomical locations not easily accessible without such assistance. Exposure Time: Please see nurses notes. Contrast: Before injecting any contrast, we confirmed that the patient did not have an allergy to iodine, shellfish, or radiological contrast. Once satisfactory needle placement was completed at the desired level, radiological contrast was injected. Contrast injected under live fluoroscopy. No contrast complications. See chart for type and volume of contrast used. Fluoroscopic Guidance: I was personally present during the use of fluoroscopy. "Tunnel Vision Technique" used to obtain the best possible view of the target area. Parallax error corrected before commencing the procedure. "Direction-depth-direction" technique used to introduce the needle under continuous pulsed fluoroscopy. Once target was reached, antero-posterior, oblique, and lateral fluoroscopic projection used confirm needle placement in all planes. Images permanently stored in EMR. Interpretation: I personally interpreted the imaging intraoperatively. Adequate needle placement confirmed in multiple planes. Appropriate spread of contrast into desired area was observed. No evidence of afferent or efferent intravascular uptake. Permanent images saved into the patient's record.  Antibiotic Prophylaxis:   Anti-infectives  (From admission, onward)   None     Indication(s): None identified  Post-operative Assessment:  Post-procedure Vital Signs:  Pulse/HCG Rate: 6368 Temp: 97.7 F (36.5 C) Resp: (!) 22 BP: 116/87 SpO2: 96 %  EBL: None  Complications: No immediate post-treatment complications observed by team, or reported by patient.  Note: The patient tolerated the entire procedure well. A repeat set of vitals were taken after the procedure and the patient was kept under observation following institutional policy, for this type of procedure. Post-procedural neurological assessment was performed, showing return to baseline, prior to discharge. The patient was provided with post-procedure discharge instructions, including a section on how to identify potential problems. Should any problems arise concerning this procedure, the patient was given instructions to immediately contact us, at any time, without hesitation. In any case, we plan to contact the patient by telephone for a follow-up status report regarding this interventional procedure.  Comments:  No additional relevant information.  Plan of Care    Imaging Orders     DG C-Arm 1-60 Min-No Report  Procedure Orders     HIP INJECTION  Medications ordered for procedure: Meds ordered this encounter  Medications  . iopamidol (ISOVUE-M) 41 % intrathecal injection 10 mL    Must be Myelogram-compatible. If not available, you may substitute with a water-soluble, non-ionic, hypoallergenic, myelogram-compatible radiological contrast medium.  Marland Kitchen lidocaine (XYLOCAINE) 2 % (with pres) injection 400 mg  . ropivacaine (PF) 2 mg/mL (0.2%) (NAROPIN) injection 4 mL  . methylPREDNISolone acetate (DEPO-MEDROL) injection 80 mg   Medications administered: We administered iopamidol, lidocaine, ropivacaine (PF) 2 mg/mL (0.2%), and methylPREDNISolone acetate.  See the medical record for exact dosing, route, and time of administration.  New Prescriptions   No  medications on file   Disposition: Discharge home  Discharge Date & Time: 05/13/2018; 1342 hrs.   Physician-requested Follow-up: Return for post-procedure eval (2 wks), w/ Dr. Dossie Arbour.  Future Appointments  Date Time Provider [place]  05/26/2018  1:00 PM Milinda Pointer, MD [facility] None  08/05/2018 11:00 AM Vevelyn Francois, NP [facility] None   Primary Care  Physician: Elisabeth Cara, NP Location: [facility] Outpatient Pain Management Facility Note by: Gaspar Cola, MD Date: 05/13/2018; Time: 1:56 PM  Disclaimer:  Medicine is not an Chief Strategy Officer. The only guarantee in medicine is that nothing is guaranteed. It is important to note that the decision to proceed with this intervention was based on the information collected from the patient. The Data and conclusions were drawn from the patient's questionnaire, the interview, and the physical examination. Because the information was provided in large part by the patient, it cannot be guaranteed that it has not been purposely or unconsciously manipulated. Every effort has been made to obtain as much relevant data as possible for this evaluation. It is important to note that the conclusions that lead to this procedure are derived in large part from the available data. Always take into account that the treatment will also be dependent on availability of resources and existing treatment guidelines, considered by other Pain Management Practitioners as being common knowledge and practice, at the time of the intervention. For Medico-Legal purposes, it is also important to point out that variation in procedural techniques and pharmacological choices are the acceptable norm. The indications, contraindications, technique, and results of the above procedure should only be interpreted and judged by a Board-Certified Interventional Pain Specialist with extensive familiarity and expertise in the same exact procedure and technique.

## 2018-05-13 NOTE — Patient Instructions (Signed)

## 2018-05-14 ENCOUNTER — Telehealth: Payer: Self-pay

## 2018-05-14 NOTE — Telephone Encounter (Signed)
No answer, Left message on answering machine to call if needed.

## 2018-05-25 DIAGNOSIS — G8929 Other chronic pain: Secondary | ICD-10-CM | POA: Insufficient documentation

## 2018-05-25 DIAGNOSIS — M545 Low back pain, unspecified: Secondary | ICD-10-CM | POA: Insufficient documentation

## 2018-05-25 DIAGNOSIS — M5388 Other specified dorsopathies, sacral and sacrococcygeal region: Secondary | ICD-10-CM | POA: Insufficient documentation

## 2018-05-25 DIAGNOSIS — M47817 Spondylosis without myelopathy or radiculopathy, lumbosacral region: Secondary | ICD-10-CM | POA: Insufficient documentation

## 2018-05-25 NOTE — Progress Notes (Signed)
Patient's Name: [name] [name].  MRN: [id number]  Referring Provider: Elisabeth Cara, NP  DOB: 1956/03/20  PCP: Elisabeth Cara, NP  DOS: 05/26/2018  Note by: Gaspar Cola, MD  Service setting: Ambulatory outpatient  Specialty: Interventional Pain Management  Location: [facility] (AMB) Pain Management Facility    Patient type: Established   Primary Reason(s) for Visit: Encounter for post-procedure evaluation of chronic illness with mild to moderate exacerbation CC: Hip Pain (left)  HPI  [name] [name] is a 62 y.o. year old, male patient, who comes today for a post-procedure evaluation. He has Apnea, sleep; H/O coronary artery bypass surgery; OSA (obstructive sleep apnea); Hypertension; Hyperlipidemia; Coronary artery disease; Chronic obstructive pulmonary disease ([place]); Alcohol withdrawal syndrome ([place]); Injury of kidney; Chronic shoulder pain (Location of Tertiary source of pain) (Right); Chronic hip pain (Left); History of alcoholism ([place]); Opiate use; Encounter for therapeutic drug level monitoring; Chronic chest wall pain (Location of Primary Source of Pain) (Incisional Midline) (since 04/22/2012); Chronic lower extremity pain (Secondary source of pain) (Bilateral) (R>L); History of MI (myocardial infarction) (January 2014); Encounter for pain management planning; Chronic sacroiliac joint pain (Bilateral) (L>R); Neuropathic pain; Neurogenic pain; Incisional pain; Elevated sedimentation rate; Vitamin D insufficiency; Lumbar facet syndrome (Bilateral) (R>L); Lumbar spondylosis; Long term current use of opiate analgesic; Long term prescription opiate use; Keloid skin disorder; Costochondritis; Unstable angina ([place]); Chest pain; Brachial plexus injury, right; Carotid atherosclerosis; Hypoalbuminemia; Hypokalemia; Right sided weakness; S/P CABG (coronary artery bypass graft); 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 His primarily concern today is the Hip Pain (left)  Pain Assessment: Location: Left Hip Radiating: denies Onset: More than a month ago Duration: Chronic pain Quality: Dull, Aching Severity: 1 /10 (subjective, self-reported pain score)  Note: Reported level is compatible with observation.                         When using our objective Pain Scale, levels between 6 and 10/10 are said to belong in an emergency room, as it progressively worsens from a 6/10, described as severely limiting, requiring emergency care not usually available at an outpatient pain management facility. At a 6/10 level, communication becomes difficult and requires great effort. Assistance to reach the emergency department may be required. Facial flushing and profuse sweating along with potentially dangerous increases in heart rate and blood pressure will be evident. Timing: Intermittent(worsens with walking) Modifying factors: procedures, sitting BP: 122/88  HR: 68  [name] [name] comes in today for post-procedure evaluation after the treatment done on 05/14/2018.  Further details on both, my assessment(s), as well as the proposed treatment plan, please see below.  Post-Procedure Assessment  05/13/2018 Procedure: Diagnostic Left IA Hip joint injection #1 under fluoro, no sedation Pre-procedure pain score:  4/10 Post-procedure pain score: 0/10 (100% relief) Influential Factors: BMI: 31.00 kg/m Intra-procedural challenges: None observed.         Assessment challenges: None detected.              Reported side-effects: None.        Post-procedural  adverse reactions or complications: None reported         Sedation:  No sedation used. When no sedatives are used, the analgesic levels obtained are directly associated to the effectiveness of the local anesthetics. However, when sedation is provided, the level of analgesia obtained during the initial 1 hour following the intervention, is believed to be the result of a combination of factors. These factors may include, but are not limited to: 1. The effectiveness of the local anesthetics used. 2. The effects of the analgesic(s) and/or anxiolytic(s) used. 3. The degree of discomfort experienced by the patient at the time of the procedure. 4. The patients ability and reliability in recalling and recording the events. 5. The presence and influence of possible secondary gains and/or psychosocial factors. Reported result: Relief experienced during the 1st hour after the procedure: 100 % (Ultra-Short Term Relief) [name] [name] has indicated area to have been numb during this time. Interpretative annotation: Clinically appropriate result. No IV Analgesic or Anxiolytic given, therefore benefits are completely due to Local Anesthetic effects. Such findings would suggest that 100% of the pain, in the treated area, is coming from the blocked structure.  Effects of local anesthetic: The analgesic effects attained during this period are directly associated to the localized infiltration of local anesthetics and therefore cary significant diagnostic value as to the etiological location, or anatomical origin, of the pain. Expected duration of relief is directly dependent on the pharmacodynamics of the local anesthetic used. Long-acting (4-6 hours) anesthetics used.  Reported result: Relief during the next 4 to 6 hour after the procedure: 100 % (Short-Term Relief) [name] has indicated area to have been numb during this time. Interpretative annotation: Clinically appropriate result. Analgesia during this period is  likely to be Local Anesthetic-related.          Long-term benefit: Defined as the period of time past the expected duration of local anesthetics (1 hour for short-acting and 4-6 hours for long-acting). With the possible exception of prolonged sympathetic blockade from the local anesthetics, benefits during this period are typically attributed to, or associated with, other factors such as analgesic sensory neuropraxia, antiinflammatory effects, or beneficial biochemical changes provided by agents other than the local anesthetics.  Reported result: Extended relief following procedure: 75 % (Long-Term Relief)            Interpretative annotation: Clinically possible results. Good relief. Therapeutic success. Inflammation plays a part in the etiology to the pain.          Current benefits: Defined as reported results that persistent at this point in time.   Analgesia: >75 % Mr. Leider reports improvement of arthralgia. Function: Mr. Jurewicz reports improvement in function ROM: Mr. Budhu reports improvement in ROM Interpretative annotation: Ongoing benefit. Therapeutic benefit observed. Effective therapeutic approach.          Interpretation: Results would suggest a successful palliative intervention.                  Plan:  Set up procedure as a PRN palliative treatment option for this patient.                Laboratory Chemistry  Inflammation Markers (CRP: Acute Phase) (ESR: Chronic Phase) Lab Results  Component Value Date   CRP <0.5 10/26/2015   ESRSEDRATE 32 (H) 10/26/2015   LATICACIDVEN 1.4 05/28/2016                         Renal Markers Lab Results  Component Value Date   BUN 24 (H) 06/16/2017   CREATININE  1.02 06/16/2017   GFRAA >60 06/16/2017   GFRNONAA >60 06/16/2017                             Hepatic Markers Lab Results  Component Value Date   AST 63 (H) 12/04/2016   ALT 55 12/04/2016   ALBUMIN 4.7 12/04/2016                        Neuropathy Markers Lab Results   Component Value Date   HGBA1C 6.1 04/17/2012                        Hematology Parameters Lab Results  Component Value Date   INR 0.97 02/09/2015   LABPROT 13.1 02/09/2015   APTT > 160.0 (HH) 04/21/2012   PLT 192 06/16/2017   HGB 13.9 06/16/2017   HCT 39.6 (L) 06/16/2017                        CV Markers Lab Results  Component Value Date   BNP 255 (H) 08/01/2014   CKTOTAL 82 05/16/2013   CKMB 1.4 05/16/2013   TROPONINI <0.03 06/16/2017                         Note: Lab results reviewed.  Recent Imaging Results   Results for orders placed in visit on 05/13/18  DG C-Arm 1-60 Min-No Report   Narrative Fluoroscopy was utilized by the requesting physician.  No radiographic  interpretation.    Interpretation Report: Fluoroscopy was used during the procedure to assist with needle guidance. The images were interpreted intraoperatively by the requesting physician.  Meds   Current Outpatient Medications:  .  allopurinol (ZYLOPRIM) 300 MG tablet, Take 100 mg by mouth daily. *patient takes in the afternoon*, Disp: , Rfl:  .  amLODipine (NORVASC) 10 MG tablet, Take 5 mg by mouth daily. , Disp: , Rfl:  .  aspirin EC 81 MG tablet, Take 81 mg by mouth every morning., Disp: , Rfl:  .  atorvastatin (LIPITOR) 40 MG tablet, Take 40 mg by mouth at bedtime. , Disp: , Rfl:  .  Cholecalciferol (VITAMIN D3) 2000 units TABS, Take 2,000 Units by mouth every morning., Disp: , Rfl:  .  gabapentin (NEURONTIN) 600 MG tablet, Take 1 tablet (600 mg total) by mouth 2 (two) times daily., Disp: 180 tablet, Rfl: 0 .  hydrochlorothiazide (HYDRODIURIL) 25 MG tablet, Take 25 mg by mouth daily. , Disp: , Rfl:  .  [START ON 07/10/2018] HYDROcodone-acetaminophen (NORCO/VICODIN) 5-325 MG tablet, Take 1 tablet by mouth 2 (two) times daily as needed for severe pain., Disp: 60 tablet, Rfl: 0 .  [START ON 06/10/2018] HYDROcodone-acetaminophen (NORCO/VICODIN) 5-325 MG tablet, Take 1 tablet by mouth 2 (two) times daily  as needed for severe pain., Disp: 60 tablet, Rfl: 0 .  HYDROcodone-acetaminophen (NORCO/VICODIN) 5-325 MG tablet, Take 1 tablet by mouth 2 (two) times daily as needed for severe pain., Disp: 60 tablet, Rfl: 0 .  Insulin Detemir (LEVEMIR FLEXTOUCH) 100 UNIT/ML Pen, Inject 46 Units into the skin 2 (two) times daily. , Disp: , Rfl:  .  isosorbide mononitrate (IMDUR) 30 MG 24 hr tablet, Take 30 mg by mouth every morning. , Disp: , Rfl: 1 .  linagliptin (TRADJENTA) 5 MG TABS tablet, Take 5 mg by mouth daily., Disp: , Rfl:  .  losartan (COZAAR) 100 MG tablet, take 1 tablet by mouth once daily, Disp: , Rfl:  .  metoprolol (LOPRESSOR) 50 MG tablet, Take 50 mg by mouth 3 (three) times daily., Disp: , Rfl:  .  Multiple Vitamin (MULTIVITAMIN WITH MINERALS) TABS tablet, Take 1 tablet by mouth every evening., Disp: , Rfl:  .  nitroGLYCERIN (NITROSTAT) 0.4 MG SL tablet, Place 0.4 mg under the tongue every 5 (five) minutes x 3 doses as needed for chest pain. , Disp: , Rfl:  .  Potassium 95 MG TABS, Take 95 mg by mouth every morning. , Disp: , Rfl:   ROS  Constitutional: Denies any fever or chills Gastrointestinal: No reported hemesis, hematochezia, vomiting, or acute GI distress Musculoskeletal: Denies any acute onset joint swelling, redness, loss of ROM, or weakness Neurological: No reported episodes of acute onset apraxia, aphasia, dysarthria, agnosia, amnesia, paralysis, loss of coordination, or loss of consciousness  Allergies  Mr. Geno has No Known Allergies.  PFSH  Drug: Mr. Apolinar  reports that he does not use drugs. Alcohol:  reports that he drinks alcohol. Tobacco:  reports that he has quit smoking. He quit after 25.00 years of use. He has quit using smokeless tobacco. Medical:  has a past medical history of Benign esophageal stricture, Cardiomyopathy, secondary ([place]), Coronary artery disease, Diverticulitis, GI bleed, Gout, Hemorrhoids, Hyperlipidemia, Hypertension, Sleep apnea, and Tubular  adenoma of colon. Surgical: Mr. Heroux  has a past surgical history that includes Coronary angioplasty with stent; cardiac bypass; Cardiac surgery; Coronary artery bypass graft; Colonoscopy with propofol (N/A, 04/29/2015); Esophagogastroduodenoscopy (egd) with propofol (N/A, 04/29/2015); Septoplasty; and Cardiac catheterization (Left, 02/03/2016). Family: family history includes Heart disease in his mother; Hypertension in his father.  Constitutional Exam  General appearance: Well nourished, well developed, and well hydrated. In no apparent acute distress Vitals:   05/26/18 1333  BP: 122/88  Pulse: 68  Resp: 18  Temp: 97.9 F (36.6 C)  TempSrc: Oral  SpO2: 97%  Weight: 175 lb (79.4 kg)  Height: '5\' 3"'  (1.6 m)   BMI Assessment: Estimated body mass index is 31 kg/m as calculated from the following:   Height as of this encounter: '5\' 3"'  (1.6 m).   Weight as of this encounter: 175 lb (79.4 kg).  BMI interpretation table: BMI level Category Range association with higher incidence of chronic pain  <18 kg/m2 Underweight   18.5-24.9 kg/m2 Ideal body weight   25-29.9 kg/m2 Overweight Increased incidence by 20%  30-34.9 kg/m2 Obese (Class I) Increased incidence by 68%  35-39.9 kg/m2 Severe obesity (Class II) Increased incidence by 136%  >40 kg/m2 Extreme obesity (Class III) Increased incidence by 254%   Patient's current BMI Ideal Body weight  Body mass index is 31 kg/m. Ideal body weight: 56.9 kg (125 lb 7.1 oz) Adjusted ideal body weight: 65.9 kg (145 lb 4.2 oz)   BMI Readings from Last 4 Encounters:  05/26/18 31.00 kg/m  05/13/18 31.00 kg/m  05/06/18 31.00 kg/m  02/04/18 30.11 kg/m   Wt Readings from Last 4 Encounters:  05/26/18 175 lb (79.4 kg)  05/13/18 175 lb (79.4 kg)  05/06/18 175 lb (79.4 kg)  02/04/18 170 lb (77.1 kg)  Psych/Mental status: Alert, oriented x 3 (person, place, & time)       Eyes: PERLA Respiratory: No evidence of acute respiratory distress  Cervical  Spine Area Exam  Skin & Axial Inspection: No masses, redness, edema, swelling, or associated skin lesions Alignment: Symmetrical Functional ROM: Unrestricted ROM  Stability: No instability detected Muscle Tone/Strength: Functionally intact. No obvious neuro-muscular anomalies detected. Sensory (Neurological): Unimpaired Palpation: No palpable anomalies              Upper Extremity (UE) Exam    Side: Right upper extremity  Side: Left upper extremity  Skin & Extremity Inspection: Skin color, temperature, and hair growth are WNL. No peripheral edema or cyanosis. No masses, redness, swelling, asymmetry, or associated skin lesions. No contractures.  Skin & Extremity Inspection: Skin color, temperature, and hair growth are WNL. No peripheral edema or cyanosis. No masses, redness, swelling, asymmetry, or associated skin lesions. No contractures.  Functional ROM: Unrestricted ROM          Functional ROM: Unrestricted ROM          Muscle Tone/Strength: Functionally intact. No obvious neuro-muscular anomalies detected.  Muscle Tone/Strength: Functionally intact. No obvious neuro-muscular anomalies detected.  Sensory (Neurological): Unimpaired          Sensory (Neurological): Unimpaired          Palpation: No palpable anomalies              Palpation: No palpable anomalies              Provocative Test(s):  Phalen's test: deferred Tinel's test: deferred Apley's scratch test (touch opposite shoulder):  Action 1 (Across chest): deferred Action 2 (Overhead): deferred Action 3 (LB reach): deferred   Provocative Test(s):  Phalen's test: deferred Tinel's test: deferred Apley's scratch test (touch opposite shoulder):  Action 1 (Across chest): deferred Action 2 (Overhead): deferred Action 3 (LB reach): deferred    Thoracic Spine Area Exam  Skin & Axial Inspection: No masses, redness, or swelling Alignment: Symmetrical Functional ROM: Unrestricted ROM Stability: No instability detected Muscle  Tone/Strength: Functionally intact. No obvious neuro-muscular anomalies detected. Sensory (Neurological): Unimpaired Muscle strength & Tone: No palpable anomalies  Lumbar Spine Area Exam  Skin & Axial Inspection: No masses, redness, or swelling Alignment: Symmetrical Functional ROM: Unrestricted ROM       Stability: No instability detected Muscle Tone/Strength: Functionally intact. No obvious neuro-muscular anomalies detected. Sensory (Neurological): Unimpaired Palpation: No palpable anomalies       Provocative Tests: Hyperextension/rotation test: deferred today       Lumbar quadrant test (Kemp's test): deferred today       Lateral bending test: deferred today       Patrick's Maneuver: deferred today                   FABER test: deferred today                   S-I anterior distraction/compression test: deferred today         S-I lateral compression test: deferred today         S-I Thigh-thrust test: deferred today         S-I Gaenslen's test: deferred today          Gait & Posture Assessment  Ambulation: Unassisted Gait: Relatively normal for age and body habitus Posture: WNL   Lower Extremity Exam    Side: Right lower extremity  Side: Left lower extremity  Stability: No instability observed          Stability: No instability observed          Skin & Extremity Inspection: Skin color, temperature, and hair growth are WNL. No peripheral edema or cyanosis. No masses, redness, swelling, asymmetry, or associated skin lesions. No contractures.  Skin &  Extremity Inspection: Skin color, temperature, and hair growth are WNL. No peripheral edema or cyanosis. No masses, redness, swelling, asymmetry, or associated skin lesions. No contractures.  Functional ROM: Unrestricted ROM                  Functional ROM: Improved after treatment                  Muscle Tone/Strength: Functionally intact. No obvious neuro-muscular anomalies detected.  Muscle Tone/Strength: Functionally intact. No  obvious neuro-muscular anomalies detected.  Sensory (Neurological): Unimpaired  Sensory (Neurological): Improved  Palpation: No palpable anomalies  Palpation: No palpable anomalies   Assessment  Primary Diagnosis & Pertinent Problem List: The primary encounter diagnosis was Chronic low back pain (Bilateral) (R>L). Diagnoses of Spondylosis without myelopathy or radiculopathy, lumbosacral region, Lumbar facet syndrome (Bilateral) (R>L), Other specified dorsopathies, sacral and sacrococcygeal region, Chronic sacroiliac joint pain (Bilateral) (L>R), Lumbar facet arthropathy (Multilevel) (Bilateral), Chronic hip pain (Left), and Osteoarthritis of hip (Left) were also pertinent to this visit.  Status Diagnosis  Controlled Controlled Controlled 1. Chronic low back pain (Bilateral) (R>L)   2. Spondylosis without myelopathy or radiculopathy, lumbosacral region   3. Lumbar facet syndrome (Bilateral) (R>L)   4. Other specified dorsopathies, sacral and sacrococcygeal region   5. Chronic sacroiliac joint pain (Bilateral) (L>R)   6. Lumbar facet arthropathy (Multilevel) (Bilateral)   7. Chronic hip pain (Left)   8. Osteoarthritis of hip (Left)     Problems updated and reviewed during this visit: Problem  Osteoarthritis of hip (Left)   Plan of Care  Pharmacotherapy (Medications Ordered): No orders of the defined types were placed in this encounter.  Medications administered today: Margaretha Seeds. "Tim" had no medications administered during this visit.   Procedure Orders     HIP INJECTION Lab Orders  No laboratory test(s) ordered today   Imaging Orders  No imaging studies ordered today   Referral Orders  No referral(s) requested today   Interventional management options: Planned, scheduled, and/or pending:   None at this time.   Considering:   Therapeutic bilateral Lumbar facet + bilateral sacroiliac joint RFA #1  Palliative bilateral lumbar facet block #3  Palliative bilateral  sacroiliac joint block #2    Palliative PRN treatment(s):   Palliative left intra-articular hip joint injection #2  Palliative bilateral lumbar facet block #3  Palliative bilateral sacroiliac joint block #2    Provider-requested follow-up: Return for PRN Procedure, Med-Mgmt, w/ Dionisio David, NP.  Future Appointments  Date Time Provider [place]  08/05/2018 11:00 AM Vevelyn Francois, NP [facility] None   Primary Care Physician: Elisabeth Cara, NP Location: [facility] Note by: Gaspar Cola, MD Date: 05/26/2018; Time: 2:02 PM

## 2018-05-26 ENCOUNTER — Other Ambulatory Visit: Payer: Self-pay

## 2018-05-26 ENCOUNTER — Ambulatory Visit: Payer: Medicare Other | Attending: Pain Medicine | Admitting: Pain Medicine

## 2018-05-26 ENCOUNTER — Encounter: Payer: Self-pay | Admitting: Pain Medicine

## 2018-05-26 VITALS — BP 122/88 | HR 68 | Temp 97.9°F | Resp 18 | Ht 63.0 in | Wt 175.0 lb

## 2018-05-26 DIAGNOSIS — M25552 Pain in left hip: Secondary | ICD-10-CM | POA: Diagnosis present

## 2018-05-26 DIAGNOSIS — M5388 Other specified dorsopathies, sacral and sacrococcygeal region: Secondary | ICD-10-CM | POA: Diagnosis not present

## 2018-05-26 DIAGNOSIS — G4730 Sleep apnea, unspecified: Secondary | ICD-10-CM | POA: Insufficient documentation

## 2018-05-26 DIAGNOSIS — G8929 Other chronic pain: Secondary | ICD-10-CM | POA: Diagnosis not present

## 2018-05-26 DIAGNOSIS — Z8249 Family history of ischemic heart disease and other diseases of the circulatory system: Secondary | ICD-10-CM | POA: Insufficient documentation

## 2018-05-26 DIAGNOSIS — Z79899 Other long term (current) drug therapy: Secondary | ICD-10-CM | POA: Insufficient documentation

## 2018-05-26 DIAGNOSIS — J449 Chronic obstructive pulmonary disease, unspecified: Secondary | ICD-10-CM | POA: Diagnosis not present

## 2018-05-26 DIAGNOSIS — I2510 Atherosclerotic heart disease of native coronary artery without angina pectoris: Secondary | ICD-10-CM | POA: Diagnosis not present

## 2018-05-26 DIAGNOSIS — G894 Chronic pain syndrome: Secondary | ICD-10-CM | POA: Insufficient documentation

## 2018-05-26 DIAGNOSIS — M4316 Spondylolisthesis, lumbar region: Secondary | ICD-10-CM | POA: Insufficient documentation

## 2018-05-26 DIAGNOSIS — I10 Essential (primary) hypertension: Secondary | ICD-10-CM | POA: Diagnosis not present

## 2018-05-26 DIAGNOSIS — I252 Old myocardial infarction: Secondary | ICD-10-CM | POA: Diagnosis not present

## 2018-05-26 DIAGNOSIS — M47816 Spondylosis without myelopathy or radiculopathy, lumbar region: Secondary | ICD-10-CM | POA: Diagnosis not present

## 2018-05-26 DIAGNOSIS — R531 Weakness: Secondary | ICD-10-CM | POA: Insufficient documentation

## 2018-05-26 DIAGNOSIS — Z7982 Long term (current) use of aspirin: Secondary | ICD-10-CM | POA: Diagnosis not present

## 2018-05-26 DIAGNOSIS — E559 Vitamin D deficiency, unspecified: Secondary | ICD-10-CM | POA: Insufficient documentation

## 2018-05-26 DIAGNOSIS — M1612 Unilateral primary osteoarthritis, left hip: Secondary | ICD-10-CM | POA: Insufficient documentation

## 2018-05-26 DIAGNOSIS — Z87891 Personal history of nicotine dependence: Secondary | ICD-10-CM | POA: Insufficient documentation

## 2018-05-26 DIAGNOSIS — Z794 Long term (current) use of insulin: Secondary | ICD-10-CM | POA: Diagnosis not present

## 2018-05-26 DIAGNOSIS — M533 Sacrococcygeal disorders, not elsewhere classified: Secondary | ICD-10-CM | POA: Insufficient documentation

## 2018-05-26 DIAGNOSIS — Z951 Presence of aortocoronary bypass graft: Secondary | ICD-10-CM | POA: Insufficient documentation

## 2018-05-26 DIAGNOSIS — M47817 Spondylosis without myelopathy or radiculopathy, lumbosacral region: Secondary | ICD-10-CM | POA: Diagnosis not present

## 2018-05-26 DIAGNOSIS — M545 Low back pain: Secondary | ICD-10-CM | POA: Diagnosis not present

## 2018-05-26 DIAGNOSIS — M25511 Pain in right shoulder: Secondary | ICD-10-CM | POA: Insufficient documentation

## 2018-05-26 DIAGNOSIS — Z86010 Personal history of colonic polyps: Secondary | ICD-10-CM | POA: Insufficient documentation

## 2018-05-26 DIAGNOSIS — E785 Hyperlipidemia, unspecified: Secondary | ICD-10-CM | POA: Diagnosis not present

## 2018-05-26 DIAGNOSIS — Z79891 Long term (current) use of opiate analgesic: Secondary | ICD-10-CM | POA: Insufficient documentation

## 2018-05-26 DIAGNOSIS — I429 Cardiomyopathy, unspecified: Secondary | ICD-10-CM | POA: Insufficient documentation

## 2018-05-26 DIAGNOSIS — Z5181 Encounter for therapeutic drug level monitoring: Secondary | ICD-10-CM | POA: Insufficient documentation

## 2018-05-26 DIAGNOSIS — Z955 Presence of coronary angioplasty implant and graft: Secondary | ICD-10-CM | POA: Insufficient documentation

## 2018-05-26 NOTE — Progress Notes (Signed)
Safety precautions to be maintained throughout the outpatient stay will include: orient to surroundings, keep bed in low position, maintain call bell within reach at all times, provide assistance with transfer out of bed and ambulation.  

## 2018-07-29 ENCOUNTER — Telehealth: Payer: Self-pay | Admitting: Nurse Practitioner

## 2018-07-29 ENCOUNTER — Other Ambulatory Visit: Payer: Self-pay | Admitting: Nurse Practitioner

## 2018-07-29 DIAGNOSIS — M792 Neuralgia and neuritis, unspecified: Secondary | ICD-10-CM

## 2018-07-29 MED ORDER — GABAPENTIN 600 MG PO TABS: 600.0000 mg | ORAL_TABLET | Freq: Two times a day (BID) | ORAL | 0 refills | Status: DC

## 2018-07-29 NOTE — Telephone Encounter (Signed)
He should have enough to last until 08/09/18 He was given 180 tabs Please find out how he is taking this it is indicated for BID.  I will just send in a 30 days supply. Thanks

## 2018-07-29 NOTE — Telephone Encounter (Signed)
Pt called and said that he needs his gabapentin refilled he doesn't have enough to last until his next appt with Crystal on 12/3. He uses [facility] in [place] on [address].

## 2018-08-05 ENCOUNTER — Ambulatory Visit: Payer: Medicare Other | Attending: Nurse Practitioner | Admitting: Nurse Practitioner

## 2018-08-05 ENCOUNTER — Encounter: Payer: Self-pay | Admitting: Nurse Practitioner

## 2018-08-05 VITALS — BP 157/84 | HR 71 | Temp 97.8°F | Resp 18 | Ht 63.0 in | Wt 175.0 lb

## 2018-08-05 DIAGNOSIS — M940 Chondrocostal junction syndrome [Tietze]: Secondary | ICD-10-CM | POA: Diagnosis not present

## 2018-08-05 DIAGNOSIS — Z8249 Family history of ischemic heart disease and other diseases of the circulatory system: Secondary | ICD-10-CM | POA: Insufficient documentation

## 2018-08-05 DIAGNOSIS — J449 Chronic obstructive pulmonary disease, unspecified: Secondary | ICD-10-CM | POA: Diagnosis not present

## 2018-08-05 DIAGNOSIS — M5137 Other intervertebral disc degeneration, lumbosacral region: Secondary | ICD-10-CM | POA: Insufficient documentation

## 2018-08-05 DIAGNOSIS — Z951 Presence of aortocoronary bypass graft: Secondary | ICD-10-CM | POA: Diagnosis not present

## 2018-08-05 DIAGNOSIS — Z955 Presence of coronary angioplasty implant and graft: Secondary | ICD-10-CM | POA: Diagnosis not present

## 2018-08-05 DIAGNOSIS — Z79899 Other long term (current) drug therapy: Secondary | ICD-10-CM | POA: Insufficient documentation

## 2018-08-05 DIAGNOSIS — I25110 Atherosclerotic heart disease of native coronary artery with unstable angina pectoris: Secondary | ICD-10-CM | POA: Diagnosis not present

## 2018-08-05 DIAGNOSIS — G8929 Other chronic pain: Secondary | ICD-10-CM

## 2018-08-05 DIAGNOSIS — I252 Old myocardial infarction: Secondary | ICD-10-CM | POA: Diagnosis not present

## 2018-08-05 DIAGNOSIS — M25511 Pain in right shoulder: Secondary | ICD-10-CM | POA: Insufficient documentation

## 2018-08-05 DIAGNOSIS — G894 Chronic pain syndrome: Secondary | ICD-10-CM | POA: Diagnosis present

## 2018-08-05 DIAGNOSIS — M79604 Pain in right leg: Secondary | ICD-10-CM | POA: Insufficient documentation

## 2018-08-05 DIAGNOSIS — M47817 Spondylosis without myelopathy or radiculopathy, lumbosacral region: Secondary | ICD-10-CM

## 2018-08-05 DIAGNOSIS — M792 Neuralgia and neuritis, unspecified: Secondary | ICD-10-CM

## 2018-08-05 DIAGNOSIS — Z79891 Long term (current) use of opiate analgesic: Secondary | ICD-10-CM | POA: Insufficient documentation

## 2018-08-05 DIAGNOSIS — G4733 Obstructive sleep apnea (adult) (pediatric): Secondary | ICD-10-CM | POA: Insufficient documentation

## 2018-08-05 DIAGNOSIS — E785 Hyperlipidemia, unspecified: Secondary | ICD-10-CM | POA: Insufficient documentation

## 2018-08-05 DIAGNOSIS — Z7982 Long term (current) use of aspirin: Secondary | ICD-10-CM | POA: Insufficient documentation

## 2018-08-05 DIAGNOSIS — Z87891 Personal history of nicotine dependence: Secondary | ICD-10-CM | POA: Insufficient documentation

## 2018-08-05 DIAGNOSIS — Z5181 Encounter for therapeutic drug level monitoring: Secondary | ICD-10-CM | POA: Diagnosis not present

## 2018-08-05 DIAGNOSIS — E876 Hypokalemia: Secondary | ICD-10-CM | POA: Insufficient documentation

## 2018-08-05 DIAGNOSIS — M79605 Pain in left leg: Secondary | ICD-10-CM | POA: Insufficient documentation

## 2018-08-05 DIAGNOSIS — M25512 Pain in left shoulder: Secondary | ICD-10-CM | POA: Diagnosis not present

## 2018-08-05 DIAGNOSIS — Z794 Long term (current) use of insulin: Secondary | ICD-10-CM | POA: Insufficient documentation

## 2018-08-05 DIAGNOSIS — R0789 Other chest pain: Secondary | ICD-10-CM

## 2018-08-05 DIAGNOSIS — E559 Vitamin D deficiency, unspecified: Secondary | ICD-10-CM | POA: Insufficient documentation

## 2018-08-05 DIAGNOSIS — I10 Essential (primary) hypertension: Secondary | ICD-10-CM | POA: Diagnosis not present

## 2018-08-05 DIAGNOSIS — F10239 Alcohol dependence with withdrawal, unspecified: Secondary | ICD-10-CM | POA: Insufficient documentation

## 2018-08-05 MED ORDER — HYDROCODONE-ACETAMINOPHEN 5-325 MG PO TABS: 1.0000 | ORAL_TABLET | Freq: Two times a day (BID) | ORAL | 0 refills | Status: DC | PRN

## 2018-08-05 MED ORDER — GABAPENTIN 600 MG PO TABS: 600.0000 mg | ORAL_TABLET | Freq: Two times a day (BID) | ORAL | 2 refills | Status: DC

## 2018-08-05 NOTE — Progress Notes (Signed)
Patient's Name: [name] [name].  MRN: [id number]  Referring Provider: Elisabeth Cara, NP  DOB: [birth year]  PCP: Elisabeth Cara, NP  DOS: 08/05/2018  Note by: Vevelyn Francois NP  Service setting: Ambulatory outpatient  Specialty: Interventional Pain Management  Location: [facility] (AMB) Pain Management Facility    Patient type: Established    Primary Reason(s) for Visit: Encounter for prescription drug management. (Level of risk: moderate)  CC: Leg Pain (bilateral); Shoulder Pain (bilaterl); and Chest Pain  HPI  [name] [name] is a 62 y.o. year old, male patient, who comes today for a medication management evaluation. He has Apnea, sleep; H/O coronary artery bypass surgery; OSA (obstructive sleep apnea); Hypertension; Hyperlipidemia; Coronary artery disease; Chronic obstructive pulmonary disease ([place]); Alcohol withdrawal syndrome ([place]); Injury of kidney; Chronic shoulder pain (Location of Tertiary source of pain) (Right); Chronic hip pain (Left); History of alcoholism ([place]); Opiate use; Encounter for therapeutic drug level monitoring; Chronic chest wall pain (Location of Primary Source of Pain) (Incisional Midline) (since 04/22/2012); Chronic lower extremity pain (Secondary source of pain) (Bilateral) (R>L); History of MI (myocardial infarction) (January 2014); Encounter for pain management planning; Chronic sacroiliac joint pain (Bilateral) (L>R); Neuropathic pain; Neurogenic pain; Incisional pain; Elevated sedimentation rate; Vitamin D insufficiency; Lumbar facet syndrome (Bilateral) (R>L); Lumbar spondylosis; Long term current use of opiate analgesic; Long term prescription opiate use; Keloid skin disorder; Costochondritis; Unstable angina ([place]); Chest pain; Brachial plexus injury, right; Carotid atherosclerosis; Hypoalbuminemia; Hypokalemia; Right sided weakness; S/P CABG (coronary artery bypass graft); Opioid-induced constipation (OIC); Pneumonia; Pressure injury of skin; COPD (chronic obstructive pulmonary  disease) ([place]); Chronic pain syndrome; DDD (degenerative disc disease), lumbosacral; Lumbar facet arthropathy (Multilevel) (Bilateral); Grade 1 Anterolisthesis of L4 on L5; Spondylosis without myelopathy or radiculopathy, lumbosacral region; Other specified dorsopathies, sacral and sacrococcygeal region; Chronic low back pain (Bilateral) (R>L); and Osteoarthritis of hip (Left) on their problem list. His primarily concern today is the Leg Pain (bilateral); Shoulder Pain (bilaterl); and Chest Pain  Pain Assessment: Location: Right, Left   Radiating: denies Onset: More than a month ago Duration: Chronic pain Quality: Aching, Constant Severity: 4 /10 (subjective, self-reported pain score)  Note: Reported level is compatible with observation.                          Effect on ADL:   Timing: Intermittent Modifying factors: medications, prolonged standing, walking BP: (!) 157/84  HR: 71  [name] [name] was last scheduled for an appointment on 07/29/2018 for medication management. During today's appointment we reviewed [name] [name] chronic pain status, as well as his outpatient medication regimen.  He admits that his pain is stable.  He denies any new pain concerns.  He denies any side effects of his current regimen.  The patient  reports that he does not use drugs. His body mass index is 31 kg/m.  Further details on both, my assessment(s), as well as the proposed treatment plan, please see below.  Controlled Substance Pharmacotherapy Assessment REMS (Risk Evaluation and Mitigation Strategy)  Analgesic:Hydrocodone/APAP 5/325 one tablet twice a day (10 mg per day) MME/day:10 mg/day.  Ignatius Specking, RN  08/05/2018 11:12 AM  Sign at close encounter Nursing Pain Medication Assessment:  Safety precautions to be maintained throughout the outpatient stay will include: orient to surroundings, keep bed in low position, maintain call bell within reach at all times, provide assistance with transfer out  of bed and ambulation.  Medication Inspection Compliance: Pill count conducted under aseptic  conditions, in front of the patient. Neither the pills nor the bottle was removed from the patient's sight at any time. Once count was completed pills were immediately returned to the patient in their original bottle.  Medication: Hydrocodone Pill/Patch Count: 9 of 60 pills remain Pill/Patch Appearance: Markings consistent with prescribed medication Bottle Appearance: Standard pharmacy container. Clearly labeled. Filled Date: 38 / 07 / 2019 Last Medication intake:  Today   Pharmacokinetics: Liberation and absorption (onset of action): WNL Distribution (time to peak effect): WNL Metabolism and excretion (duration of action): WNL         Pharmacodynamics: Desired effects: Analgesia: [name] [name] reports >50% benefit. Functional ability: Patient reports that medication allows him to accomplish basic ADLs Clinically meaningful improvement in function (CMIF): Sustained CMIF goals met Perceived effectiveness: Described as relatively effective, allowing for increase in activities of daily living (ADL) Undesirable effects: Side-effects or Adverse reactions: None reported Monitoring: [place] PMP: Online review of the past 6-monthperiod conducted. Compliant with practice rules and regulations Last UDS on record: Summary  Date Value Ref Range Status  05/06/2018 FINAL  Final    Comment:    ==================================================================== TOXASSURE SELECT 13 (MW) ==================================================================== Test                             Result       Flag       Units Drug Present and Declared for Prescription Verification   Hydrocodone                    924          EXPECTED   ng/mg creat   Hydromorphone                  159          EXPECTED   ng/mg creat   Dihydrocodeine                 38           EXPECTED   ng/mg creat   Norhydrocodone                  636          EXPECTED   ng/mg creat    Sources of hydrocodone include scheduled prescription    medications. Hydromorphone, dihydrocodeine and norhydrocodone are    expected metabolites of hydrocodone. Hydromorphone and    dihydrocodeine are also available as scheduled prescription    medications. ==================================================================== Test                      Result    Flag   Units      Ref Range   Creatinine              245              mg/dL      >=20 ==================================================================== Declared Medications:  The flagging and interpretation on this report are based on the  following declared medications.  Unexpected results may arise from  inaccuracies in the declared medications.  **Note: The testing scope of this panel includes these medications:  Hydrocodone (Norco)  **Note: The testing scope of this panel does not include following  reported medications:  Acetaminophen (Norco)  Allopurinol (Zyloprim)  Amlodipine (Norvasc)  Aspirin  Atorvastatin (Lipitor)  Gabapentin (Neurontin)  Hydrochlorothiazide (Hydrodiuril)  Insulin (Levemir)  Isosorbide (Imdur)  Linagliptin (  Tradjenta)  Losartan (Cozaar)  Metoprolol (Lopressor)  Multivitamin  Nitroglycerin (Nitrostat)  Potassium  Vitamin D3 ==================================================================== For clinical consultation, please call [phone]. ====================================================================    UDS interpretation: Compliant          Medication Assessment Form: Reviewed. Patient indicates being compliant with therapy Treatment compliance: Compliant Risk Assessment Profile: Aberrant behavior: See prior evaluations. None observed or detected today Comorbid factors increasing risk of overdose: See prior notes. No additional risks detected today Opioid risk tool (ORT) (Total Score): 0 Personal History of Substance Abuse  (SUD-Substance use disorder):  Alcohol: Negative  Illegal Drugs: Negative  Rx Drugs: Negative  ORT Risk Level calculation: Low Risk Risk of substance use disorder (SUD): Low Opioid Risk Tool - 08/05/18 1110      Family History of Substance Abuse   Alcohol  Negative    Illegal Drugs  Negative    Rx Drugs  Negative      Personal History of Substance Abuse   Alcohol  Negative    Illegal Drugs  Negative    Rx Drugs  Negative      Age   Age between 6-45 years   No      Psychological Disease   Psychological Disease  Negative    Depression  Negative      Total Score   Opioid Risk Tool Scoring  0    Opioid Risk Interpretation  Low Risk      ORT Scoring interpretation table:  Score <3 = Low Risk for SUD  Score between 4-7 = Moderate Risk for SUD  Score >8 = High Risk for Opioid Abuse   Risk Mitigation Strategies:  Patient Counseling: Covered Patient-Prescriber Agreement (PPA): Present and active  Notification to other healthcare providers: Done  Pharmacologic Plan: No change in therapy, at this time.             Laboratory Chemistry  Inflammation Markers (CRP: Acute Phase) (ESR: Chronic Phase) Lab Results  Component Value Date   CRP <0.5 10/26/2015   ESRSEDRATE 32 (H) 10/26/2015   LATICACIDVEN 1.4 05/28/2016                         Rheumatology Markers No results found for: RF, ANA, LABURIC, URICUR, LYMEIGGIGMAB, LYMEABIGMQN, HLAB27                      Renal Function Markers Lab Results  Component Value Date   BUN 24 (H) 06/16/2017   CREATININE 1.02 06/16/2017   GFRAA >60 06/16/2017   GFRNONAA >60 06/16/2017                             Hepatic Function Markers Lab Results  Component Value Date   AST 63 (H) 12/04/2016   ALT 55 12/04/2016   ALBUMIN 4.7 12/04/2016   ALKPHOS 62 12/04/2016   LIPASE 29 03/19/2015                        Electrolytes Lab Results  Component Value Date   NA 136 06/16/2017   K 3.2 (L) 06/16/2017   CL 97 (L) 06/16/2017    CALCIUM 9.2 06/16/2017   MG 2.0 10/26/2015                        Neuropathy Markers Lab Results  Component Value Date   HGBA1C 6.1  04/17/2012                        CNS Tests No results found for: COLORCSF, APPEARCSF, RBCCOUNTCSF, WBCCSF, POLYSCSF, LYMPHSCSF, EOSCSF, PROTEINCSF, GLUCCSF, JCVIRUS, CSFOLI, IGGCSF                      Bone Pathology Markers Lab Results  Component Value Date   VD25OH 26.5 (L) 10/26/2015   VD125OH2TOT 30.8 10/26/2015                         Coagulation Parameters Lab Results  Component Value Date   INR 0.97 02/09/2015   LABPROT 13.1 02/09/2015   APTT > 160.0 (HH) 04/21/2012   PLT 192 06/16/2017                        Cardiovascular Markers Lab Results  Component Value Date   BNP 255 (H) 08/01/2014   CKTOTAL 82 05/16/2013   CKMB 1.4 05/16/2013   TROPONINI <0.03 06/16/2017   HGB 13.9 06/16/2017   HCT 39.6 (L) 06/16/2017                         CA Markers No results found for: CEA, CA125, LABCA2                      Note: Lab results reviewed.  Recent Diagnostic Imaging Results  DG C-Arm 1-60 Min-No Report Fluoroscopy was utilized by the requesting physician.  No radiographic  interpretation.   Complexity Note: Imaging results reviewed. Results shared with [name] [name], using Layman's terms.                         Meds   Current Outpatient Medications:  .  allopurinol (ZYLOPRIM) 300 MG tablet, Take 100 mg by mouth daily. *patient takes in the afternoon*, Disp: , Rfl:  .  amLODipine (NORVASC) 10 MG tablet, Take 5 mg by mouth daily. , Disp: , Rfl:  .  aspirin EC 81 MG tablet, Take 81 mg by mouth every morning., Disp: , Rfl:  .  atorvastatin (LIPITOR) 40 MG tablet, Take 40 mg by mouth at bedtime. , Disp: , Rfl:  .  Cholecalciferol (VITAMIN D3) 2000 units TABS, Take 2,000 Units by mouth every morning., Disp: , Rfl:  .  [START ON 09/03/2018] gabapentin (NEURONTIN) 600 MG tablet, Take 1 tablet (600 mg total) by mouth 2 (two) times  daily., Disp: 60 tablet, Rfl: 2 .  hydrochlorothiazide (HYDRODIURIL) 25 MG tablet, Take 25 mg by mouth daily. , Disp: , Rfl:  .  [START ON 10/08/2018] HYDROcodone-acetaminophen (NORCO/VICODIN) 5-325 MG tablet, Take 1 tablet by mouth 2 (two) times daily as needed for severe pain., Disp: 60 tablet, Rfl: 0 .  Insulin Detemir (LEVEMIR FLEXTOUCH) 100 UNIT/ML Pen, Inject 46 Units into the skin 2 (two) times daily. , Disp: , Rfl:  .  isosorbide mononitrate (IMDUR) 30 MG 24 hr tablet, Take 30 mg by mouth every morning. , Disp: , Rfl: 1 .  linagliptin (TRADJENTA) 5 MG TABS tablet, Take 5 mg by mouth daily., Disp: , Rfl:  .  losartan (COZAAR) 100 MG tablet, take 1 tablet by mouth once daily, Disp: , Rfl:  .  metoprolol (LOPRESSOR) 50 MG tablet, Take 50 mg by mouth 3 (three) times daily., Disp: , Rfl:  .  Multiple Vitamin (MULTIVITAMIN WITH MINERALS) TABS tablet, Take 1 tablet by mouth every evening., Disp: , Rfl:  .  nitroGLYCERIN (NITROSTAT) 0.4 MG SL tablet, Place 0.4 mg under the tongue every 5 (five) minutes x 3 doses as needed for chest pain. , Disp: , Rfl:  .  Potassium 95 MG TABS, Take 95 mg by mouth every morning. , Disp: , Rfl:  .  [START ON 09/08/2018] HYDROcodone-acetaminophen (NORCO/VICODIN) 5-325 MG tablet, Take 1 tablet by mouth 2 (two) times daily as needed for severe pain., Disp: 60 tablet, Rfl: 0 .  [START ON 08/09/2018] HYDROcodone-acetaminophen (NORCO/VICODIN) 5-325 MG tablet, Take 1 tablet by mouth 2 (two) times daily as needed for severe pain., Disp: 60 tablet, Rfl: 0  ROS  Constitutional: Denies any fever or chills Gastrointestinal: No reported hemesis, hematochezia, vomiting, or acute GI distress Musculoskeletal: Denies any acute onset joint swelling, redness, loss of ROM, or weakness Neurological: No reported episodes of acute onset apraxia, aphasia, dysarthria, agnosia, amnesia, paralysis, loss of coordination, or loss of consciousness  Allergies  [name] [name] has No Known  Allergies.  PFSH  Drug: [name] [name]  reports that he does not use drugs. Alcohol:  reports that he drinks alcohol. Tobacco:  reports that he has quit smoking. He quit after 25.00 years of use. He has quit using smokeless tobacco. Medical:  has a past medical history of Benign esophageal stricture, Cardiomyopathy, secondary ([employer]), Coronary artery disease, Diverticulitis, GI bleed, Gout, Hemorrhoids, Hyperlipidemia, Hypertension, Sleep apnea, and Tubular adenoma of colon. Surgical: [name] [name]  has a past surgical history that includes Coronary angioplasty with stent; cardiac bypass; Cardiac surgery; Coronary artery bypass graft; Colonoscopy with propofol (N/A, 04/29/2015); Esophagogastroduodenoscopy (egd) with propofol (N/A, 04/29/2015); Septoplasty; and Cardiac catheterization (Left, 02/03/2016). Family: family history includes Heart disease in his mother; Hypertension in his father.  Constitutional Exam  General appearance: Well nourished, well developed, and well hydrated. In no apparent acute distress Vitals:   08/05/18 1103  BP: (!) 157/84  Pulse: 71  Resp: 18  Temp: 97.8 F (36.6 C)  SpO2: 98%  Weight: 175 lb (79.4 kg)  Height: '5\' 3"'  (1.6 m)  Psych/Mental status: Alert, oriented x 3 (person, place, & time)       Eyes: PERLA Respiratory: No evidence of acute respiratory distress  Cervical Spine Area Exam  Skin & Axial Inspection: No masses, redness, edema, swelling, or associated skin lesions Alignment: Symmetrical Functional ROM: Unrestricted ROM      Stability: No instability detected Muscle Tone/Strength: Functionally intact. No obvious neuro-muscular anomalies detected. Sensory (Neurological): Unimpaired Palpation: No palpable anomalies               Thoracic Spine Area Exam  Skin & Axial Inspection: No masses, redness, or swelling Alignment: Symmetrical Functional ROM: Unrestricted ROM Stability: No instability detected Muscle Tone/Strength: Functionally intact. No  obvious neuro-muscular anomalies detected. Sensory (Neurological): Unimpaired Muscle strength & Tone: No palpable anomalies  Lumbar Spine Area Exam  Skin & Axial Inspection: No masses, redness, or swelling Alignment: Symmetrical Functional ROM: Unrestricted ROM       Stability: No instability detected Muscle Tone/Strength: Functionally intact. No obvious neuro-muscular anomalies detected. Sensory (Neurological): Unimpaired Palpation: No palpable anomalies        Gait & Posture Assessment  Ambulation: Unassisted Gait: Relatively normal for age and body habitus Posture: WNL   Lower Extremity Exam    Side: Right lower extremity  Side: Left lower extremity  Stability: No instability observed  Stability: No instability observed          Skin & Extremity Inspection: Evidence of prior LE surgeries  Skin & Extremity Inspection: Evidence of prior LE surgeries  Functional ROM: Unrestricted ROM                  Functional ROM: Unrestricted ROM                  Muscle Tone/Strength: Deconditioned  Muscle Tone/Strength: Deconditioned  Sensory (Neurological): Unimpaired        Sensory (Neurological): Unimpaired            Palpation: No palpable anomalies  Palpation: No palpable anomalies   Assessment  Primary Diagnosis & Pertinent Problem List: The primary encounter diagnosis was Spondylosis without myelopathy or radiculopathy, lumbosacral region. Diagnoses of Chronic chest wall pain (Location of Primary Source of Pain) (Incisional Midline) (since 04/22/2012), Neurogenic pain, and Chronic pain syndrome were also pertinent to this visit.  Status Diagnosis  Controlled Controlled Controlled 1. Spondylosis without myelopathy or radiculopathy, lumbosacral region   2. Chronic chest wall pain (Location of Primary Source of Pain) (Incisional Midline) (since 04/22/2012)   3. Neurogenic pain   4. Chronic pain syndrome     Problems updated and reviewed during this visit: No problems  updated. Plan of Care  Pharmacotherapy (Medications Ordered): Meds ordered this encounter  Medications  . gabapentin (NEURONTIN) 600 MG tablet    Sig: Take 1 tablet (600 mg total) by mouth 2 (two) times daily.    Dispense:  60 tablet    Refill:  2    Do not add this medication to the electronic "Automatic Refill" notification system. Patient may have prescription filled one day early if pharmacy is closed on scheduled refill date.    Order Specific Question:   Supervising Provider    Answer:   Milinda Pointer [phone]  . HYDROcodone-acetaminophen (NORCO/VICODIN) 5-325 MG tablet    Sig: Take 1 tablet by mouth 2 (two) times daily as needed for severe pain.    Dispense:  60 tablet    Refill:  0    Do not place this medication, or any other prescription from our practice, on "Automatic Refill". Patient may have prescription filled one day early if pharmacy is closed on scheduled refill date.    Order Specific Question:   Supervising Provider    Answer:   Milinda Pointer [phone]  . HYDROcodone-acetaminophen (NORCO/VICODIN) 5-325 MG tablet    Sig: Take 1 tablet by mouth 2 (two) times daily as needed for severe pain.    Dispense:  60 tablet    Refill:  0    Do not place this medication, or any other prescription from our practice, on "Automatic Refill". Patient may have prescription filled one day early if pharmacy is closed on scheduled refill date.    Order Specific Question:   Supervising Provider    Answer:   Milinda Pointer [phone]  . HYDROcodone-acetaminophen (NORCO/VICODIN) 5-325 MG tablet    Sig: Take 1 tablet by mouth 2 (two) times daily as needed for severe pain.    Dispense:  60 tablet    Refill:  0    Do not place this medication, or any other prescription from our practice, on "Automatic Refill". Patient may have prescription filled one day early if pharmacy is closed on scheduled refill date.    Order Specific Question:   Supervising Provider    Answer:   Milinda Pointer [phone]   New  Prescriptions   No medications on file   Medications administered today: Margaretha Seeds. "Tim" had no medications administered during this visit. Lab-work, procedure(s), and/or referral(s): No orders of the defined types were placed in this encounter.  Imaging and/or referral(s): None  Interventional therapies: Planned, scheduled, and/or pending: None at this time. Labs to be sent from primary care office; [facility].   Considering: Lumbar facet and sacroiliac joint radiofrequency ablation.   Palliative PRN treatment(s): Diagnostic left intra-articular hip joint injection under fluoroscopic guidance, with or without sedation     Provider-requested follow-up: Return in about 3 months (around 11/04/2018) for MedMgmt, medical record release.  Future Appointments  Date Time Provider [place]  11/03/2018 11:00 AM Vevelyn Francois, NP [facility] None   Primary Care Physician: Elisabeth Cara, NP Location: [facility] Outpatient [facility] Management Facility Note by: Vevelyn Francois NP Date: 08/05/2018; Time: 2:33 PM  Pain Score Disclaimer: We use the NRS-11 scale. This is a self-reported, subjective measurement of pain severity with only modest accuracy. It is used primarily to identify changes within a particular patient. It must be understood that outpatient pain scales are significantly less accurate that those used for research, where they can be applied under ideal controlled circumstances with minimal exposure to variables. In reality, the score is likely to be a combination of pain intensity and pain affect, where pain affect describes the degree of emotional arousal or changes in action readiness caused by the sensory experience of pain. Factors such as social and work situation, setting, emotional state, anxiety levels, expectation, and prior pain experience may influence pain perception and show large inter-individual differences that may also  be affected by time variables.  Patient instructions provided during this appointment: Patient Instructions   ____________________________________________________________________________________________  Medication Rules  Purpose: To inform patients, and their family members, of our rules and regulations.  Applies to: All patients receiving prescriptions (written or electronic).  Pharmacy of record: Pharmacy where electronic prescriptions will be sent. If written prescriptions are taken to a different pharmacy, please inform the nursing staff. The pharmacy listed in the electronic medical record should be the one where you would like electronic prescriptions to be sent.  Electronic prescriptions: In compliance with the [place] (STOP) Act of 2017 (Session Lanny Cramp [phone]), effective September 03, 2018, all controlled substances must be electronically prescribed. Calling prescriptions to the pharmacy will cease to exist.  Prescription refills: Only during scheduled appointments. Applies to all prescriptions.  NOTE: The following applies primarily to controlled substances (Opioid* Pain Medications).   Patient's responsibilities: 1. Pain Pills: Bring all pain pills to every appointment (except for procedure appointments). 2. Pill Bottles: Bring pills in original pharmacy bottle. Always bring the newest bottle. Bring bottle, even if empty. 3. Medication refills: You are responsible for knowing and keeping track of what medications you take and those you need refilled. The day before your appointment: write a list of all prescriptions that need to be refilled. The day of the appointment: give the list to the admitting nurse. Prescriptions will be written only during appointments. If you forget a medication: it will not be "Called in", "Faxed", or "electronically sent". You will need to get another appointment to get these prescribed. No early refills. Do  not call asking to have your prescription filled early. 4. Prescription Accuracy: You are responsible for carefully inspecting your prescriptions before leaving our office. Have the discharge nurse carefully go over each prescription with you, before taking  them home. Make sure that your name is accurately spelled, that your address is correct. Check the name and dose of your medication to make sure it is accurate. Check the number of pills, and the written instructions to make sure they are clear and accurate. Make sure that you are given enough medication to last until your next medication refill appointment. 5. Taking Medication: Take medication as prescribed. When it comes to controlled substances, taking less pills or less frequently than prescribed is permitted and encouraged. Never take more pills than instructed. Never take medication more frequently than prescribed.  6. Inform other Doctors: Always inform, all of your healthcare providers, of all the medications you take. 7. Pain Medication from other Providers: You are not allowed to accept any additional pain medication from any other Doctor or Healthcare provider. There are two exceptions to this rule. (see below) In the event that you require additional pain medication, you are responsible for notifying us, as stated below. 8. Medication Agreement: You are responsible for carefully reading and following our Medication Agreement. This must be signed before receiving any prescriptions from our practice. Safely store a copy of your signed Agreement. Violations to the Agreement will result in no further prescriptions. (Additional copies of our Medication Agreement are available upon request.) 9. Laws, Rules, & Regulations: All patients are expected to follow all Federal and [employer], [employer], Rules, [employer]. Ignorance of the Laws does not constitute a valid excuse. The use of any illegal substances is prohibited. 10. Adopted CDC guidelines  & recommendations: Target dosing levels will be at or below 60 MME/day. Use of benzodiazepines** is not recommended.  Exceptions: There are only two exceptions to the rule of not receiving pain medications from other Healthcare Providers. 1. Exception #1 (Emergencies): In the event of an emergency (i.e.: accident requiring emergency care), you are allowed to receive additional pain medication. However, you are responsible for: As soon as you are able, call our office [phone], at any time of the day or night, and leave a message stating your name, the date and nature of the emergency, and the name and dose of the medication prescribed. In the event that your call is answered by a member of our staff, make sure to document and save the date, time, and the name of the person that took your information.  2. Exception #2 (Planned Surgery): In the event that you are scheduled by another doctor or dentist to have any type of surgery or procedure, you are allowed (for a period no longer than 30 days), to receive additional pain medication, for the acute post-op pain. However, in this case, you are responsible for picking up a copy of our "Post-op Pain Management for Surgeons" handout, and giving it to your surgeon or dentist. This document is available at our office, and does not require an appointment to obtain it. Simply go to our office during business hours (Monday-Thursday from 8:00 AM to 4:00 PM) (Friday 8:00 AM to 12:00 Noon) or if you have a scheduled appointment with [place], prior to your surgery, and ask for it by name. In addition, you will need to provide [place] with your name, name of your surgeon, type of surgery, and date of procedure or surgery.  *Opioid medications include: morphine, codeine, oxycodone, oxymorphone, hydrocodone, hydromorphone, meperidine, tramadol, tapentadol, buprenorphine, fentanyl, methadone. **Benzodiazepine medications include: diazepam (Valium), alprazolam (Xanax), clonazepam  (Klonopine), lorazepam (Ativan), clorazepate (Tranxene), chlordiazepoxide (Librium), estazolam (Prosom), oxazepam (Serax), temazepam (Restoril), triazolam (Halcion) (Last  updated: 10/31/2017) ____________________________________________________________________________________________  BMI Assessment: Estimated body mass index is 31 kg/m as calculated from the following:   Height as of this encounter: '5\' 3"'  (1.6 m).   Weight as of this encounter: 175 lb (79.4 kg).  BMI interpretation table: BMI level Category Range association with higher incidence of chronic pain  <18 kg/m2 Underweight   18.5-24.9 kg/m2 Ideal body weight   25-29.9 kg/m2 Overweight Increased incidence by 20%  30-34.9 kg/m2 Obese (Class I) Increased incidence by 68%  35-39.9 kg/m2 Severe obesity (Class II) Increased incidence by 136%  >40 kg/m2 Extreme obesity (Class III) Increased incidence by 254%   Patient's current BMI Ideal Body weight  Body mass index is 31 kg/m. Ideal body weight: 56.9 kg (125 lb 7.1 oz) Adjusted ideal body weight: 65.9 kg (145 lb 4.2 oz)   BMI Readings from Last 4 Encounters:  08/05/18 31.00 kg/m  05/26/18 31.00 kg/m  05/13/18 31.00 kg/m  05/06/18 31.00 kg/m   Wt Readings from Last 4 Encounters:  08/05/18 175 lb (79.4 kg)  05/26/18 175 lb (79.4 kg)  05/13/18 175 lb (79.4 kg)  05/06/18 175 lb (79.4 kg)

## 2018-08-05 NOTE — Patient Instructions (Addendum)
____________________________________________________________________________________________  Medication Rules  Purpose: To inform patients, and their family members, of our rules and regulations.  Applies to: All patients receiving prescriptions (written or electronic).  Pharmacy of record: Pharmacy where electronic prescriptions will be sent. If written prescriptions are taken to a different pharmacy, please inform the nursing staff. The pharmacy listed in the electronic medical record should be the one where you would like electronic prescriptions to be sent.  Electronic prescriptions: In compliance with the [place] Strengthen Opioid Misuse Prevention (STOP) Act of 2017 (Session Law 2017-74/H243), effective September 03, 2018, all controlled substances must be electronically prescribed. Calling prescriptions to the pharmacy will cease to exist.  Prescription refills: Only during scheduled appointments. Applies to all prescriptions.  NOTE: The following applies primarily to controlled substances (Opioid* Pain Medications).   Patient's responsibilities: 1. Pain Pills: Bring all pain pills to every appointment (except for procedure appointments). 2. Pill Bottles: Bring pills in original pharmacy bottle. Always bring the newest bottle. Bring bottle, even if empty. 3. Medication refills: You are responsible for knowing and keeping track of what medications you take and those you need refilled. The day before your appointment: write a list of all prescriptions that need to be refilled. The day of the appointment: give the list to the admitting nurse. Prescriptions will be written only during appointments. If you forget a medication: it will not be "Called in", "Faxed", or "electronically sent". You will need to get another appointment to get these prescribed. No early refills. Do not call asking to have your prescription filled early. 4. Prescription Accuracy: You are responsible for  carefully inspecting your prescriptions before leaving our office. Have the discharge nurse carefully go over each prescription with you, before taking them home. Make sure that your name is accurately spelled, that your address is correct. Check the name and dose of your medication to make sure it is accurate. Check the number of pills, and the written instructions to make sure they are clear and accurate. Make sure that you are given enough medication to last until your next medication refill appointment. 5. Taking Medication: Take medication as prescribed. When it comes to controlled substances, taking less pills or less frequently than prescribed is permitted and encouraged. Never take more pills than instructed. Never take medication more frequently than prescribed.  6. Inform other Doctors: Always inform, all of your healthcare providers, of all the medications you take. 7. Pain Medication from other Providers: You are not allowed to accept any additional pain medication from any other Doctor or Healthcare provider. There are two exceptions to this rule. (see below) In the event that you require additional pain medication, you are responsible for notifying us, as stated below. 8. Medication Agreement: You are responsible for carefully reading and following our Medication Agreement. This must be signed before receiving any prescriptions from our practice. Safely store a copy of your signed Agreement. Violations to the Agreement will result in no further prescriptions. (Additional copies of our Medication Agreement are available upon request.) 9. Laws, Rules, & Regulations: All patients are expected to follow all Federal and State Laws, Statutes, Rules, & Regulations. Ignorance of the Laws does not constitute a valid excuse. The use of any illegal substances is prohibited. 10. Adopted CDC guidelines & recommendations: Target dosing levels will be at or below 60 MME/day. Use of benzodiazepines** is not  recommended.  Exceptions: There are only two exceptions to the rule of not receiving pain medications from other Healthcare Providers. 1.   Exception #1 (Emergencies): In the event of an emergency (i.e.: accident requiring emergency care), you are allowed to receive additional pain medication. However, you are responsible for: As soon as you are able, call our office [phone], at any time of the day or night, and leave a message stating your name, the date and nature of the emergency, and the name and dose of the medication prescribed. In the event that your call is answered by a member of our staff, make sure to document and save the date, time, and the name of the person that took your information.  2. Exception #2 (Planned Surgery): In the event that you are scheduled by another doctor or dentist to have any type of surgery or procedure, you are allowed (for a period no longer than 30 days), to receive additional pain medication, for the acute post-op pain. However, in this case, you are responsible for picking up a copy of our "Post-op Pain Management for Surgeons" handout, and giving it to your surgeon or dentist. This document is available at our office, and does not require an appointment to obtain it. Simply go to our office during business hours (Monday-Thursday from 8:00 AM to 4:00 PM) (Friday 8:00 AM to 12:00 Noon) or if you have a scheduled appointment with [place], prior to your surgery, and ask for it by name. In addition, you will need to provide [place] with your name, name of your surgeon, type of surgery, and date of procedure or surgery.  *Opioid medications include: morphine, codeine, oxycodone, oxymorphone, hydrocodone, hydromorphone, meperidine, tramadol, tapentadol, buprenorphine, fentanyl, methadone. **Benzodiazepine medications include: diazepam (Valium), alprazolam (Xanax), clonazepam (Klonopine), lorazepam (Ativan), clorazepate (Tranxene), chlordiazepoxide (Librium), estazolam (Prosom),  oxazepam (Serax), temazepam (Restoril), triazolam (Halcion) (Last updated: 10/31/2017) ____________________________________________________________________________________________  BMI Assessment: Estimated body mass index is 31 kg/m as calculated from the following:   Height as of this encounter: 5\' 3"  (1.6 m).   Weight as of this encounter: 175 lb (79.4 kg).  BMI interpretation table: BMI level Category Range association with higher incidence of chronic pain  <18 kg/m2 Underweight   18.5-24.9 kg/m2 Ideal body weight   25-29.9 kg/m2 Overweight Increased incidence by 20%  30-34.9 kg/m2 Obese (Class I) Increased incidence by 68%  35-39.9 kg/m2 Severe obesity (Class II) Increased incidence by 136%  >40 kg/m2 Extreme obesity (Class III) Increased incidence by 254%   Patient's current BMI Ideal Body weight  Body mass index is 31 kg/m. Ideal body weight: 56.9 kg (125 lb 7.1 oz) Adjusted ideal body weight: 65.9 kg (145 lb 4.2 oz)   BMI Readings from Last 4 Encounters:  08/05/18 31.00 kg/m  05/26/18 31.00 kg/m  05/13/18 31.00 kg/m  05/06/18 31.00 kg/m   Wt Readings from Last 4 Encounters:  08/05/18 175 lb (79.4 kg)  05/26/18 175 lb (79.4 kg)  05/13/18 175 lb (79.4 kg)  05/06/18 175 lb (79.4 kg)

## 2018-08-05 NOTE — Progress Notes (Signed)
Nursing Pain Medication Assessment:  Safety precautions to be maintained throughout the outpatient stay will include: orient to surroundings, keep bed in low position, maintain call bell within reach at all times, provide assistance with transfer out of bed and ambulation.  Medication Inspection Compliance: Pill count conducted under aseptic conditions, in front of the patient. Neither the pills nor the bottle was removed from the patient's sight at any time. Once count was completed pills were immediately returned to the patient in their original bottle.  Medication: Hydrocodone Pill/Patch Count: 9 of 60 pills remain Pill/Patch Appearance: Markings consistent with prescribed medication Bottle Appearance: Standard pharmacy container. Clearly labeled. Filled Date: 97 / 07 / 2019 Last Medication intake:  Today

## 2018-09-23 DIAGNOSIS — Z6831 Body mass index (BMI) 31.0-31.9, adult: Secondary | ICD-10-CM | POA: Insufficient documentation

## 2018-09-30 ENCOUNTER — Telehealth: Payer: Self-pay | Admitting: Nurse Practitioner

## 2018-09-30 NOTE — Telephone Encounter (Signed)
Will be in [place] all next week when his meds are due to be filled. Wants to know if you can cancel his script for here and send it to [facility]  [address], [place]

## 2018-09-30 NOTE — Telephone Encounter (Signed)
Please call the patient and make him aware that this is not policy.  What are his reasons for going?

## 2018-10-02 NOTE — Telephone Encounter (Signed)
This patient was told on the phone yesterday that this was not going to be possible per his medication contract. Tried to call patient again today to reiterate this and there was no answer.

## 2018-10-27 ENCOUNTER — Ambulatory Visit: Payer: Medicare Other | Admitting: Nurse Practitioner

## 2018-11-03 ENCOUNTER — Encounter: Payer: Self-pay | Admitting: Nurse Practitioner

## 2018-11-03 ENCOUNTER — Ambulatory Visit: Payer: Medicare Other | Attending: Nurse Practitioner | Admitting: Nurse Practitioner

## 2018-11-03 ENCOUNTER — Other Ambulatory Visit: Payer: Self-pay

## 2018-11-03 VITALS — BP 112/86 | HR 79 | Temp 97.5°F | Resp 16 | Ht 63.0 in | Wt 175.0 lb

## 2018-11-03 DIAGNOSIS — Z79891 Long term (current) use of opiate analgesic: Secondary | ICD-10-CM | POA: Diagnosis present

## 2018-11-03 DIAGNOSIS — M792 Neuralgia and neuritis, unspecified: Secondary | ICD-10-CM | POA: Diagnosis present

## 2018-11-03 DIAGNOSIS — M47816 Spondylosis without myelopathy or radiculopathy, lumbar region: Secondary | ICD-10-CM | POA: Insufficient documentation

## 2018-11-03 DIAGNOSIS — R0789 Other chest pain: Secondary | ICD-10-CM

## 2018-11-03 DIAGNOSIS — G8929 Other chronic pain: Secondary | ICD-10-CM | POA: Diagnosis present

## 2018-11-03 DIAGNOSIS — G894 Chronic pain syndrome: Secondary | ICD-10-CM | POA: Diagnosis present

## 2018-11-03 MED ORDER — HYDROCODONE-ACETAMINOPHEN 5-325 MG PO TABS: 1.0000 | ORAL_TABLET | Freq: Two times a day (BID) | ORAL | 0 refills | Status: DC | PRN

## 2018-11-03 MED ORDER — GABAPENTIN 600 MG PO TABS: 600.0000 mg | ORAL_TABLET | Freq: Two times a day (BID) | ORAL | 2 refills | Status: DC

## 2018-11-03 NOTE — Progress Notes (Signed)
Patient's Name: [name] [name].  MRN: [id number]  Referring Provider: Elisabeth Cara, NP  DOB: Mar 20, 1956  PCP: Elisabeth Cara, NP  DOS: 11/03/2018  Note by: Dionisio David, NP  Service setting: Ambulatory outpatient  Specialty: Interventional Pain Management  Location: [facility] (AMB) Pain Management Facility    Patient type: Established   HPI  Reason for Visit: Mr. Christoper Bushey. is a 63 y.o. year old, male patient, who comes today with a chief complaint of Back Pain (bilateral lumbar); Leg Pain (bilateral ); and Abdominal Pain (mid sternum from open heart surgery ) Last Appointment: His last appointment at our practice was on 09/30/2018. I last saw him on 09/30/2018.  Pain Assessment: Today, Mr. Zajkowski describes the severity of the Chronic pain as a 3 /10. He indicates the location/referral of the pain to be Back(legs) Lower, Left, Right/denies . Onset was: More than a month ago. The quality of pain is described as Discomfort, Nagging, Aching. Temporal description, or timing of pain is: Constant. Possible modifying factors: medications. Mr. Wierzbicki  height is '5\' 3"'  (1.6 m) and weight is 175 lb (79.4 kg). His oral temperature is 97.5 F (36.4 C) (abnormal). His blood pressure is 112/86 and his pulse is 79. His respiration is 16 and oxygen saturation is 97%. He denies any changes with his pain.  He denies any new concerns today.  He denies any side effects of his medication.  Controlled Substance Pharmacotherapy Assessment REMS (Risk Evaluation and Mitigation Strategy)  Analgesic:Hydrocodone/APAP 5/325 one tablet twice a day (10 mg per day) MME/day:10 mg/day. Janett Billow, RN  11/03/2018 11:06 AM  Sign when Signing Visit Nursing Pain Medication Assessment:  Safety precautions to be maintained throughout the outpatient stay will include: orient to surroundings, keep bed in low position, maintain call bell within reach at all times, provide assistance with transfer out of bed and ambulation.   Medication Inspection Compliance: Pill count conducted under aseptic conditions, in front of the patient. Neither the pills nor the bottle was removed from the patient's sight at any time. Once count was completed pills were immediately returned to the patient in their original bottle.  Medication: Hydrocodone/APAP Pill/Patch Count: 17 of 60 pills remain Pill/Patch Appearance: Markings consistent with prescribed medication Bottle Appearance: Standard pharmacy container. Clearly labeled. Filled Date: 02 / 10 / 2020 Last Medication intake:  Today   Pharmacokinetics: Liberation and absorption (onset of action): WNL Distribution (time to peak effect): WNL Metabolism and excretion (duration of action): WNL         Pharmacodynamics: Desired effects: Analgesia: Mr. Lambson reports >50% benefit. Functional ability: Patient reports that medication allows him to accomplish basic ADLs Clinically meaningful improvement in function (CMIF): Sustained CMIF goals met Perceived effectiveness: Described as relatively effective, allowing for increase in activities of daily living (ADL) Undesirable effects: Side-effects or Adverse reactions: None reported Monitoring: [place] PMP: Online review of the past 52-monthperiod conducted. Compliant with practice rules and regulations Last UDS on record: Summary  Date Value Ref Range Status  05/06/2018 FINAL  Final    Comment:    ==================================================================== TOXASSURE SELECT 13 (MW) ==================================================================== Test                             Result       Flag       Units Drug Present and Declared for Prescription Verification   Hydrocodone  924          EXPECTED   ng/mg creat   Hydromorphone                  159          EXPECTED   ng/mg creat   Dihydrocodeine                 38           EXPECTED   ng/mg creat   Norhydrocodone                 636           EXPECTED   ng/mg creat    Sources of hydrocodone include scheduled prescription    medications. Hydromorphone, dihydrocodeine and norhydrocodone are    expected metabolites of hydrocodone. Hydromorphone and    dihydrocodeine are also available as scheduled prescription    medications. ==================================================================== Test                      Result    Flag   Units      Ref Range   Creatinine              245              mg/dL      >=20 ==================================================================== Declared Medications:  The flagging and interpretation on this report are based on the  following declared medications.  Unexpected results may arise from  inaccuracies in the declared medications.  **Note: The testing scope of this panel includes these medications:  Hydrocodone (Norco)  **Note: The testing scope of this panel does not include following  reported medications:  Acetaminophen (Norco)  Allopurinol (Zyloprim)  Amlodipine (Norvasc)  Aspirin  Atorvastatin (Lipitor)  Gabapentin (Neurontin)  Hydrochlorothiazide (Hydrodiuril)  Insulin (Levemir)  Isosorbide (Imdur)  Linagliptin (Tradjenta)  Losartan (Cozaar)  Metoprolol (Lopressor)  Multivitamin  Nitroglycerin (Nitrostat)  Potassium  Vitamin D3 ==================================================================== For clinical consultation, please call [phone]. ====================================================================    UDS interpretation: Compliant          Medication Assessment Form: Reviewed. Patient indicates being compliant with therapy Treatment compliance: Compliant Risk Assessment Profile: Aberrant behavior: See initial evaluations. None observed or detected today Comorbid factors increasing risk of overdose: See initial evaluation. No additional risks detected today Opioid risk tool (ORT):  Opioid Risk  08/05/2018  Alcohol 0  Illegal Drugs 0  Rx  Drugs 0  Alcohol 0  Illegal Drugs 0  Rx Drugs 0  Age between 16-45 years  0  History of Preadolescent Sexual Abuse -  Psychological Disease 0  Depression 0  Opioid Risk Tool Scoring 0  Opioid Risk Interpretation Low Risk    ORT Scoring interpretation table:  Score <3 = Low Risk for SUD  Score between 4-7 = Moderate Risk for SUD  Score >8 = High Risk for Opioid Abuse   Risk of substance use disorder (SUD): Low  Risk Mitigation Strategies:  Patient Counseling: Covered Patient-Prescriber Agreement (PPA): Present and active  Notification to other healthcare providers: Done  Pharmacologic Plan: No change in therapy, at this time.            ROS  Constitutional: Denies any fever or chills Gastrointestinal: No reported hemesis, hematochezia, vomiting, or acute GI distress Musculoskeletal: Denies any acute onset joint swelling, redness, loss of ROM, or weakness Neurological: No reported episodes of acute onset apraxia, aphasia, dysarthria, agnosia, amnesia, paralysis, loss of  coordination, or loss of consciousness  Medication Review  HYDROcodone-acetaminophen, Insulin Detemir, Potassium, Semaglutide(0.25 or 0.5MG/DOS), Vitamin D3, allopurinol, amLODipine, aspirin EC, atorvastatin, gabapentin, hydrochlorothiazide, isosorbide mononitrate, linagliptin, losartan, metoprolol tartrate, multivitamin with minerals, and nitroGLYCERIN  History Review  Allergy: Mr. Garno has No Known Allergies. Drug: Mr. Whitecotton  reports no history of drug use. Alcohol:  reports current alcohol use. Tobacco:  reports that he has quit smoking. He quit after 25.00 years of use. He has quit using smokeless tobacco. Social: Mr. Dunlow  reports that he has quit smoking. He quit after 25.00 years of use. He has quit using smokeless tobacco. He reports current alcohol use. He reports that he does not use drugs. Medical:  has a past medical history of Benign esophageal stricture, Cardiomyopathy, secondary ([place]),  Coronary artery disease, Diverticulitis, GI bleed, Gout, Hemorrhoids, Hyperlipidemia, Hypertension, Sleep apnea, and Tubular adenoma of colon. Surgical: Mr. Masden  has a past surgical history that includes Coronary angioplasty with stent; cardiac bypass; Cardiac surgery; Coronary artery bypass graft; Colonoscopy with propofol (N/A, 04/29/2015); Esophagogastroduodenoscopy (egd) with propofol (N/A, 04/29/2015); Septoplasty; and Cardiac catheterization (Left, 02/03/2016). Family: family history includes Heart disease in his mother; Hypertension in his father. Problem List: Mr. Whitford has Chronic shoulder pain (Location of Tertiary source of pain) (Right); Chronic hip pain (Left); Chronic chest wall pain (Location of Primary Source of Pain) (Incisional Midline) (since 04/22/2012); Chronic lower extremity pain (Secondary source of pain) (Bilateral) (R>L); Chronic sacroiliac joint pain (Bilateral) (L>R); Neuropathic pain; Neurogenic pain; Incisional pain; Lumbar facet syndrome (Bilateral) (R>L); Lumbar spondylosis; Keloid skin disorder; Costochondritis; and Chronic pain syndrome on their pertinent problem list.  Lab Review  Kidney Function Lab Results  Component Value Date   BUN 24 (H) 06/16/2017   CREATININE 1.02 06/16/2017   GFRAA >60 06/16/2017   GFRNONAA >60 06/16/2017  Liver Function Lab Results  Component Value Date   AST 63 (H) 12/04/2016   ALT 55 12/04/2016   ALBUMIN 4.7 12/04/2016  Note: Above Lab results reviewed.  Imaging Review  DG C-Arm 1-60 Min-No Report Fluoroscopy was utilized by the requesting physician.  No radiographic  interpretation.  Note: Reviewed        Physical Exam  General appearance: Well nourished, well developed, and well hydrated. In no apparent acute distress Mental status: Alert, oriented x 3 (person, place, & time)       Respiratory: No evidence of acute respiratory distress Eyes: PERLA Vitals: BP 112/86 (BP Location: Left Arm, Patient Position: Sitting,  Cuff Size: Normal)   Pulse 79   Temp (!) 97.5 F (36.4 C) (Oral)   Resp 16   Ht '5\' 3"'  (1.6 m)   Wt 175 lb (79.4 kg)   SpO2 97%   BMI 31.00 kg/m  BMI: Estimated body mass index is 31 kg/m as calculated from the following:   Height as of this encounter: '5\' 3"'  (1.6 m).   Weight as of this encounter: 175 lb (79.4 kg). Ideal: Ideal body weight: 56.9 kg (125 lb 7.1 oz) Adjusted ideal body weight: 65.9 kg (145 lb 4.2 oz) Lumbar Spine Area Exam  Skin & Axial Inspection: No masses, redness, or swelling Alignment: Symmetrical Functional ROM: Unrestricted ROM       Stability: No instability detected Muscle Tone/Strength: Functionally intact. No obvious neuro-muscular anomalies detected. Sensory (Neurological): Unimpaired Palpation: No palpable anomalies        *(Flexion, ABduction and External Rotation) Gait & Posture Assessment  Ambulation: Unassisted Gait: Relatively normal for age and body habitus  Posture: WNL   Assessment   Status Diagnosis  Controlled Controlled Controlled 1. Chronic chest wall pain (Location of Primary Source of Pain) (Incisional Midline) (since 04/22/2012)   2. Lumbar spondylosis   3. Neurogenic pain   4. Chronic pain syndrome   5. Long term current use of opiate analgesic      Updated Problems: No problems updated.  Plan of Care  Pharmacotherapy (Medications Ordered): Meds ordered this encounter  Medications  . gabapentin (NEURONTIN) 600 MG tablet    Sig: Take 1 tablet (600 mg total) by mouth 2 (two) times daily.    Dispense:  60 tablet    Refill:  2    Do not add this medication to the electronic "Automatic Refill" notification system. Patient may have prescription filled one day early if pharmacy is closed on scheduled refill date.    Order Specific Question:   Supervising Provider    Answer:   Milinda Pointer [phone]  . HYDROcodone-acetaminophen (NORCO/VICODIN) 5-325 MG tablet    Sig: Take 1 tablet by mouth 2 (two) times daily as needed  for up to 30 days for severe pain.    Dispense:  60 tablet    Refill:  0    Do not place this medication, or any other prescription from our practice, on "Automatic Refill". Patient may have prescription filled one day early if pharmacy is closed on scheduled refill date.    Order Specific Question:   Supervising Provider    Answer:   Milinda Pointer [phone]  . HYDROcodone-acetaminophen (NORCO/VICODIN) 5-325 MG tablet    Sig: Take 1 tablet by mouth 2 (two) times daily as needed for up to 30 days for severe pain.    Dispense:  60 tablet    Refill:  0    Do not place this medication, or any other prescription from our practice, on "Automatic Refill". Patient may have prescription filled one day early if pharmacy is closed on scheduled refill date.    Order Specific Question:   Supervising Provider    Answer:   Milinda Pointer [phone]  . HYDROcodone-acetaminophen (NORCO/VICODIN) 5-325 MG tablet    Sig: Take 1 tablet by mouth 2 (two) times daily as needed for up to 30 days for severe pain.    Dispense:  60 tablet    Refill:  0    Do not place this medication, or any other prescription from our practice, on "Automatic Refill". Patient may have prescription filled one day early if pharmacy is closed on scheduled refill date.    Order Specific Question:   Supervising Provider    Answer:   Milinda Pointer [[id number]]   Administered today: Margaretha Seeds. "Tim" had no medications administered during this visit.  Orders:  No orders of the defined types were placed in this encounter.  Interventional options: Planned follow-up:   Not at this time.  Plan: Return in about 3 months (around 02/03/2019) for MedMgmt.   Considering: Lumbar facet and sacroiliac joint radiofrequency ablation.   Palliative PRN treatment(s): Diagnostic left intra-articular hip joint injection under fluoroscopic guidance, with or without sedation     Note by: Dionisio David, NP Date: 11/03/2018; Time:  12:05 PM

## 2018-11-03 NOTE — Progress Notes (Signed)
Nursing Pain Medication Assessment:  Safety precautions to be maintained throughout the outpatient stay will include: orient to surroundings, keep bed in low position, maintain call bell within reach at all times, provide assistance with transfer out of bed and ambulation.  Medication Inspection Compliance: Pill count conducted under aseptic conditions, in front of the patient. Neither the pills nor the bottle was removed from the patient's sight at any time. Once count was completed pills were immediately returned to the patient in their original bottle.  Medication: Hydrocodone/APAP Pill/Patch Count: 17 of 60 pills remain Pill/Patch Appearance: Markings consistent with prescribed medication Bottle Appearance: Standard pharmacy container. Clearly labeled. Filled Date: 02 / 10 / 2020 Last Medication intake:  Today

## 2018-11-03 NOTE — Patient Instructions (Signed)
____________________________________________________________________________________________  Medication Rules  Purpose: To inform patients, and their family members, of our rules and regulations.  Applies to: All patients receiving prescriptions (written or electronic).  Pharmacy of record: Pharmacy where electronic prescriptions will be sent. If written prescriptions are taken to a different pharmacy, please inform the nursing staff. The pharmacy listed in the electronic medical record should be the one where you would like electronic prescriptions to be sent.  Electronic prescriptions: In compliance with the [place] Strengthen Opioid Misuse Prevention (STOP) Act of 2017 (Session Law 2017-74/H243), effective September 03, 2018, all controlled substances must be electronically prescribed. Calling prescriptions to the pharmacy will cease to exist.  Prescription refills: Only during scheduled appointments. Applies to all prescriptions.  NOTE: The following applies primarily to controlled substances (Opioid* Pain Medications).   Patient's responsibilities: 1. Pain Pills: Bring all pain pills to every appointment (except for procedure appointments). 2. Pill Bottles: Bring pills in original pharmacy bottle. Always bring the newest bottle. Bring bottle, even if empty. 3. Medication refills: You are responsible for knowing and keeping track of what medications you take and those you need refilled. The day before your appointment: write a list of all prescriptions that need to be refilled. The day of the appointment: give the list to the admitting nurse. Prescriptions will be written only during appointments. No prescriptions will be written on procedure days. If you forget a medication: it will not be "Called in", "Faxed", or "electronically sent". You will need to get another appointment to get these prescribed. No early refills. Do not call asking to have your prescription filled  early. 4. Prescription Accuracy: You are responsible for carefully inspecting your prescriptions before leaving our office. Have the discharge nurse carefully go over each prescription with you, before taking them home. Make sure that your name is accurately spelled, that your address is correct. Check the name and dose of your medication to make sure it is accurate. Check the number of pills, and the written instructions to make sure they are clear and accurate. Make sure that you are given enough medication to last until your next medication refill appointment. 5. Taking Medication: Take medication as prescribed. When it comes to controlled substances, taking less pills or less frequently than prescribed is permitted and encouraged. Never take more pills than instructed. Never take medication more frequently than prescribed.  6. Inform other Doctors: Always inform, all of your healthcare providers, of all the medications you take. 7. Pain Medication from other Providers: You are not allowed to accept any additional pain medication from any other Doctor or Healthcare provider. There are two exceptions to this rule. (see below) In the event that you require additional pain medication, you are responsible for notifying us, as stated below. 8. Medication Agreement: You are responsible for carefully reading and following our Medication Agreement. This must be signed before receiving any prescriptions from our practice. Safely store a copy of your signed Agreement. Violations to the Agreement will result in no further prescriptions. (Additional copies of our Medication Agreement are available upon request.) 9. Laws, Rules, & Regulations: All patients are expected to follow all Federal and State Laws, Statutes, Rules, & Regulations. Ignorance of the Laws does not constitute a valid excuse. The use of any illegal substances is prohibited. 10. Adopted CDC guidelines & recommendations: Target dosing levels will be  at or below 60 MME/day. Use of benzodiazepines** is not recommended.  Exceptions: There are only two exceptions to the rule of not   receiving pain medications from other Healthcare Providers. 1. Exception #1 (Emergencies): In the event of an emergency (i.e.: accident requiring emergency care), you are allowed to receive additional pain medication. However, you are responsible for: As soon as you are able, call our office [phone], at any time of the day or night, and leave a message stating your name, the date and nature of the emergency, and the name and dose of the medication prescribed. In the event that your call is answered by a member of our staff, make sure to document and save the date, time, and the name of the person that took your information.  2. Exception #2 (Planned Surgery): In the event that you are scheduled by another doctor or dentist to have any type of surgery or procedure, you are allowed (for a period no longer than 30 days), to receive additional pain medication, for the acute post-op pain. However, in this case, you are responsible for picking up a copy of our "Post-op Pain Management for Surgeons" handout, and giving it to your surgeon or dentist. This document is available at our office, and does not require an appointment to obtain it. Simply go to our office during business hours (Monday-Thursday from 8:00 AM to 4:00 PM) (Friday 8:00 AM to 12:00 Noon) or if you have a scheduled appointment with us, prior to your surgery, and ask for it by name. In addition, you will need to provide us with your name, name of your surgeon, type of surgery, and date of procedure or surgery.  *Opioid medications include: morphine, codeine, oxycodone, oxymorphone, hydrocodone, hydromorphone, meperidine, tramadol, tapentadol, buprenorphine, fentanyl, methadone. **Benzodiazepine medications include: diazepam (Valium), alprazolam (Xanax), clonazepam (Klonopine), lorazepam (Ativan), clorazepate  (Tranxene), chlordiazepoxide (Librium), estazolam (Prosom), oxazepam (Serax), temazepam (Restoril), triazolam (Halcion) (Last updated: 10/31/2017) ____________________________________________________________________________________________    

## 2019-02-02 ENCOUNTER — Other Ambulatory Visit: Payer: Self-pay

## 2019-02-02 ENCOUNTER — Ambulatory Visit: Payer: Medicare Other | Attending: Nurse Practitioner | Admitting: Pain Medicine

## 2019-02-02 DIAGNOSIS — G894 Chronic pain syndrome: Secondary | ICD-10-CM

## 2019-02-02 DIAGNOSIS — M79604 Pain in right leg: Secondary | ICD-10-CM | POA: Diagnosis not present

## 2019-02-02 DIAGNOSIS — M792 Neuralgia and neuritis, unspecified: Secondary | ICD-10-CM

## 2019-02-02 DIAGNOSIS — M25511 Pain in right shoulder: Secondary | ICD-10-CM | POA: Diagnosis not present

## 2019-02-02 DIAGNOSIS — R0789 Other chest pain: Secondary | ICD-10-CM

## 2019-02-02 DIAGNOSIS — M79605 Pain in left leg: Secondary | ICD-10-CM

## 2019-02-02 DIAGNOSIS — G8929 Other chronic pain: Secondary | ICD-10-CM

## 2019-02-02 MED ORDER — HYDROCODONE-ACETAMINOPHEN 5-325 MG PO TABS: 1.0000 | ORAL_TABLET | Freq: Two times a day (BID) | ORAL | 0 refills | Status: DC | PRN

## 2019-02-02 MED ORDER — GABAPENTIN 600 MG PO TABS: 600.0000 mg | ORAL_TABLET | Freq: Two times a day (BID) | ORAL | 2 refills | Status: DC

## 2019-02-02 NOTE — Progress Notes (Signed)
Pain Management Virtual Encounter Note - Virtual Visit via Telephone Telehealth (real-time audio visits between healthcare provider and patient).  Patient's Phone No. & Preferred Pharmacy:  [phone] (home); [phone] (mobile); (Preferred) [phone] [email or web address]  [facility] [place], [place] - [address] [place] [address] Phone: [phone] Fax: [phone]  [facility] [place], [place] [place] [address] Phone: [phone] Fax: [phone]   Pre-screening note:  Our staff contacted Mr. Fyock and offered him an "in person", "face-to-face" appointment versus a telephone encounter. He indicated preferring the telephone encounter, at this time.  Reason for Virtual Visit: COVID-19*  Social distancing based on CDC and AMA recommendations.   I contacted Margaretha Seeds. on 02/02/2019 at 1:20 PM via telephone.      I clearly identified myself as Gaspar Cola, MD. I verified that I was speaking with the correct person using two identifiers (Name and date of birth: [birth year]).  Advanced Informed Consent I sought verbal advanced consent from Margaretha Seeds. for virtual visit interactions. I informed Mr. Xiang of possible security and privacy concerns, risks, and limitations associated with providing "not-in-person" medical evaluation and management services. I also informed Mr. Wolford of the availability of "in-person" appointments. Finally, I informed him that there would be a charge for the virtual visit and that he could be  personally, fully or partially, financially responsible for it. Mr. Marcy expressed understanding and agreed to proceed.   Historic Elements   Mr. Emily Forse. is a 63 y.o. year old, male patient evaluated today after his last encounter by our practice on 11/03/2018. Mr. Gorrell  has a past  medical history of Benign esophageal stricture, Cardiomyopathy, secondary ([place]), Coronary artery disease, Diverticulitis, GI bleed, Gout, Hemorrhoids, Hyperlipidemia, Hypertension, Sleep apnea, and Tubular adenoma of colon. He also  has a past surgical history that includes Coronary angioplasty with stent; cardiac bypass; Cardiac surgery; Coronary artery bypass graft; Colonoscopy with propofol (N/A, 04/29/2015); Esophagogastroduodenoscopy (egd) with propofol (N/A, 04/29/2015); Septoplasty; and Cardiac catheterization (Left, 02/03/2016). Mr. Lindahl has a current medication list which includes the following prescription(s): allopurinol, amlodipine, aspirin ec, atorvastatin, vitamin d3, gabapentin, hydrochlorothiazide, hydrocodone-acetaminophen, hydrocodone-acetaminophen, hydrocodone-acetaminophen, insulin detemir, isosorbide mononitrate, linagliptin, losartan, metoprolol tartrate, multivitamin with minerals, nitroglycerin, ozempic (0.25 or 0.5 mg/dose), and potassium. He  reports that he has quit smoking. He quit after 25.00 years of use. He has quit using smokeless tobacco. He reports current alcohol use. He reports that he does not use drugs. Mr. Cisse has No Known Allergies.   HPI  I last communicated with him on Visit date not found. Today, he is being contacted for medication management.  Pharmacotherapy Assessment  Analgesic: Hydrocodone/APAP 5/325 1 tablet p.o. twice daily (10 mg/day of hydrocodone) MME/day: 10 mg/day.   Monitoring: Pharmacotherapy: No side-effects or adverse reactions reported. [place] PMP: PDMP reviewed during this encounter.       Compliance: No problems identified. Effectiveness: Clinically acceptable. Plan: Refer to "POC".  Pertinent Labs  Renal Function Lab Results  Component Value Date   BUN 24 (H) 06/16/2017   CREATININE 1.02 06/16/2017   GFRAA >60 06/16/2017   GFRNONAA >60 06/16/2017   Hepatic Function Lab Results  Component Value Date   AST 63 (H) 12/04/2016    ALT 55 12/04/2016   ALBUMIN 4.7 12/04/2016   UDS Summary  Date Value  Ref Range Status  05/06/2018 FINAL  Final    Comment:    ==================================================================== TOXASSURE SELECT 13 (MW) ==================================================================== Test                             Result       Flag       Units Drug Present and Declared for Prescription Verification   Hydrocodone                    924          EXPECTED   ng/mg creat   Hydromorphone                  159          EXPECTED   ng/mg creat   Dihydrocodeine                 38           EXPECTED   ng/mg creat   Norhydrocodone                 636          EXPECTED   ng/mg creat    Sources of hydrocodone include scheduled prescription    medications. Hydromorphone, dihydrocodeine and norhydrocodone are    expected metabolites of hydrocodone. Hydromorphone and    dihydrocodeine are also available as scheduled prescription    medications. ==================================================================== Test                      Result    Flag   Units      Ref Range   Creatinine              245              mg/dL      >=20 ==================================================================== Declared Medications:  The flagging and interpretation on this report are based on the  following declared medications.  Unexpected results may arise from  inaccuracies in the declared medications.  **Note: The testing scope of this panel includes these medications:  Hydrocodone (Norco)  **Note: The testing scope of this panel does not include following  reported medications:  Acetaminophen (Norco)  Allopurinol (Zyloprim)  Amlodipine (Norvasc)  Aspirin  Atorvastatin (Lipitor)  Gabapentin (Neurontin)  Hydrochlorothiazide (Hydrodiuril)  Insulin (Levemir)  Isosorbide (Imdur)  Linagliptin (Tradjenta)  Losartan (Cozaar)  Metoprolol (Lopressor)  Multivitamin  Nitroglycerin (Nitrostat)   Potassium  Vitamin D3 ==================================================================== For clinical consultation, please call [phone]. ====================================================================    Note: Above Lab results reviewed.  Recent imaging  DG C-Arm 1-60 Min-No Report Fluoroscopy was utilized by the requesting physician.  No radiographic  interpretation.   Assessment  The primary encounter diagnosis was Chronic pain syndrome. Diagnoses of Chronic chest wall pain (Primary Area of Pain) (Incisional Midline) (since 04/22/2012), Chronic lower extremity pain (Secondary area of Pain) (Bilateral) (R>L), Chronic shoulder pain (Third area of Pain) (Right), and Neurogenic pain were also pertinent to this visit.  Plan of Care  I have changed Margaretha Seeds. "Tim"'s HYDROcodone-acetaminophen, HYDROcodone-acetaminophen, and HYDROcodone-acetaminophen. I am also having him maintain his metoprolol tartrate, allopurinol, atorvastatin, isosorbide mononitrate, multivitamin with minerals, Potassium, nitroGLYCERIN, aspirin EC, Vitamin D3, amLODipine, hydrochlorothiazide, losartan, Insulin Detemir, linagliptin, Ozempic (0.25 or 0.5 MG/DOSE), and gabapentin.  Pharmacotherapy (Medications Ordered): Meds ordered this encounter  Medications  . HYDROcodone-acetaminophen (NORCO/VICODIN) 5-325 MG tablet    Sig: Take  1 tablet by mouth 2 (two) times daily as needed for up to 30 days for severe pain. Must last 30 days    Dispense:  60 tablet    Refill:  0    Chronic Pain: STOP Act - Not applicable. Fill 1 day early if closed on scheduled refill date. Do not fill until: 04/11/19. To last until: 05/11/19. Instruct to avoid benzodiazepines within 8 hours of opioid.  Marland Kitchen HYDROcodone-acetaminophen (NORCO/VICODIN) 5-325 MG tablet    Sig: Take 1 tablet by mouth 2 (two) times daily as needed for up to 30 days for severe pain. Must last 30 days    Dispense:  60 tablet    Refill:  0    Chronic Pain:  STOP Act - Not applicable. Fill 1 day early if closed on scheduled refill date. Do not fill until: 03/12/19. To last until: 04/11/19. Instruct to avoid benzodiazepines within 8 hours of opioid.  Marland Kitchen HYDROcodone-acetaminophen (NORCO/VICODIN) 5-325 MG tablet    Sig: Take 1 tablet by mouth 2 (two) times daily as needed for up to 30 days for severe pain. Must last 30 days    Dispense:  60 tablet    Refill:  0    Chronic Pain: STOP Act - Not applicable. Fill 1 day early if closed on scheduled refill date. Do not fill until: 02/10/19. To last until: 03/12/19. Instruct to avoid benzodiazepines within 8 hours of opioid.  Marland Kitchen gabapentin (NEURONTIN) 600 MG tablet    Sig: Take 1 tablet (600 mg total) by mouth 2 (two) times daily.    Dispense:  60 tablet    Refill:  2    Fill one day early if pharmacy is closed on scheduled refill date. May substitute for generic if available.   Orders:  No orders of the defined types were placed in this encounter.  Follow-up plan:   Return for (3 mo) Med-Mgmt, (Virtual Visit).    I discussed the assessment and treatment plan with the patient. The patient was provided an opportunity to ask questions and all were answered. The patient agreed with the plan and demonstrated an understanding of the instructions.  Patient advised to call back or seek an in-person evaluation if the symptoms or condition worsens.  Total duration of non-face-to-face encounter: 13 minutes.  Note by: Gaspar Cola, MD Date: 02/02/2019; Time: 1:35 PM  Note: This dictation was prepared with Dragon dictation. Any transcriptional errors that may result from this process are unintentional.  Disclaimer:  * Given the special circumstances of the COVID-19 pandemic, the federal government has announced that the Office for Civil Rights (OCR) will exercise its enforcement discretion and will not impose penalties on physicians using telehealth in the event of noncompliance with regulatory requirements under the  [place] (HIPAA) in connection with the good faith provision of telehealth during the YBWLS-93 national public health emergency. ([place])

## 2019-02-02 NOTE — Patient Instructions (Signed)
____________________________________________________________________________________________  Medication Rules  Purpose: To inform patients, and their family members, of our rules and regulations.  Applies to: All patients receiving prescriptions (written or electronic).  Pharmacy of record: Pharmacy where electronic prescriptions will be sent. If written prescriptions are taken to a different pharmacy, please inform the nursing staff. The pharmacy listed in the electronic medical record should be the one where you would like electronic prescriptions to be sent.  Electronic prescriptions: In compliance with the [place] Strengthen Opioid Misuse Prevention (STOP) Act of 2017 (Session Law 2017-74/H243), effective September 03, 2018, all controlled substances must be electronically prescribed. Calling prescriptions to the pharmacy will cease to exist.  Prescription refills: Only during scheduled appointments. Applies to all prescriptions.  NOTE: The following applies primarily to controlled substances (Opioid* Pain Medications).   Patient's responsibilities: 1. Pain Pills: Bring all pain pills to every appointment (except for procedure appointments). 2. Pill Bottles: Bring pills in original pharmacy bottle. Always bring the newest bottle. Bring bottle, even if empty. 3. Medication refills: You are responsible for knowing and keeping track of what medications you take and those you need refilled. The day before your appointment: write a list of all prescriptions that need to be refilled. The day of the appointment: give the list to the admitting nurse. Prescriptions will be written only during appointments. No prescriptions will be written on procedure days. If you forget a medication: it will not be "Called in", "Faxed", or "electronically sent". You will need to get another appointment to get these prescribed. No early refills. Do not call asking to have your prescription filled  early. 4. Prescription Accuracy: You are responsible for carefully inspecting your prescriptions before leaving our office. Have the discharge nurse carefully go over each prescription with you, before taking them home. Make sure that your name is accurately spelled, that your address is correct. Check the name and dose of your medication to make sure it is accurate. Check the number of pills, and the written instructions to make sure they are clear and accurate. Make sure that you are given enough medication to last until your next medication refill appointment. 5. Taking Medication: Take medication as prescribed. When it comes to controlled substances, taking less pills or less frequently than prescribed is permitted and encouraged. Never take more pills than instructed. Never take medication more frequently than prescribed.  6. Inform other Doctors: Always inform, all of your healthcare providers, of all the medications you take. 7. Pain Medication from other Providers: You are not allowed to accept any additional pain medication from any other Doctor or Healthcare provider. There are two exceptions to this rule. (see below) In the event that you require additional pain medication, you are responsible for notifying us, as stated below. 8. Medication Agreement: You are responsible for carefully reading and following our Medication Agreement. This must be signed before receiving any prescriptions from our practice. Safely store a copy of your signed Agreement. Violations to the Agreement will result in no further prescriptions. (Additional copies of our Medication Agreement are available upon request.) 9. Laws, Rules, & Regulations: All patients are expected to follow all Federal and State Laws, Statutes, Rules, & Regulations. Ignorance of the Laws does not constitute a valid excuse. The use of any illegal substances is prohibited. 10. Adopted CDC guidelines & recommendations: Target dosing levels will be  at or below 60 MME/day. Use of benzodiazepines** is not recommended.  Exceptions: There are only two exceptions to the rule of not   receiving pain medications from other Healthcare Providers. 1. Exception #1 (Emergencies): In the event of an emergency (i.e.: accident requiring emergency care), you are allowed to receive additional pain medication. However, you are responsible for: As soon as you are able, call our office [phone], at any time of the day or night, and leave a message stating your name, the date and nature of the emergency, and the name and dose of the medication prescribed. In the event that your call is answered by a member of our staff, make sure to document and save the date, time, and the name of the person that took your information.  2. Exception #2 (Planned Surgery): In the event that you are scheduled by another doctor or dentist to have any type of surgery or procedure, you are allowed (for a period no longer than 30 days), to receive additional pain medication, for the acute post-op pain. However, in this case, you are responsible for picking up a copy of our "Post-op Pain Management for Surgeons" handout, and giving it to your surgeon or dentist. This document is available at our office, and does not require an appointment to obtain it. Simply go to our office during business hours (Monday-Thursday from 8:00 AM to 4:00 PM) (Friday 8:00 AM to 12:00 Noon) or if you have a scheduled appointment with us, prior to your surgery, and ask for it by name. In addition, you will need to provide us with your name, name of your surgeon, type of surgery, and date of procedure or surgery.  *Opioid medications include: morphine, codeine, oxycodone, oxymorphone, hydrocodone, hydromorphone, meperidine, tramadol, tapentadol, buprenorphine, fentanyl, methadone. **Benzodiazepine medications include: diazepam (Valium), alprazolam (Xanax), clonazepam (Klonopine), lorazepam (Ativan), clorazepate  (Tranxene), chlordiazepoxide (Librium), estazolam (Prosom), oxazepam (Serax), temazepam (Restoril), triazolam (Halcion) (Last updated: 10/31/2017) ____________________________________________________________________________________________   ____________________________________________________________________________________________  Medication Recommendations and Reminders  Applies to: All patients receiving prescriptions (written and/or electronic).  Medication Rules & Regulations: These rules and regulations exist for your safety and that of others. They are not flexible and neither are we. Dismissing or ignoring them will be considered "non-compliance" with medication therapy, resulting in complete and irreversible termination of such therapy. (See document titled "Medication Rules" for more details.) In all conscience, because of safety reasons, we cannot continue providing a therapy where the patient does not follow instructions.  Pharmacy of record:   Definition: This is the pharmacy where your electronic prescriptions will be sent.   We do not endorse any particular pharmacy.  You are not restricted in your choice of pharmacy.  The pharmacy listed in the electronic medical record should be the one where you want electronic prescriptions to be sent.  If you choose to change pharmacy, simply notify our nursing staff of your choice of new pharmacy.  Recommendations:  Keep all of your pain medications in a safe place, under lock and key, even if you live alone.   After you fill your prescription, take 1 week's worth of pills and put them away in a safe place. You should keep a separate, properly labeled bottle for this purpose. The remainder should be kept in the original bottle. Use this as your primary supply, until it runs out. Once it's gone, then you know that you have 1 week's worth of medicine, and it is time to come in for a prescription refill. If you do this correctly, it  is unlikely that you will ever run out of medicine.  To make sure that the above recommendation works,   it is very important that you make sure your medication refill appointments are scheduled at least 1 week before you run out of medicine. To do this in an effective manner, make sure that you do not leave the office without scheduling your next medication management appointment. Always ask the nursing staff to show you in your prescription , when your medication will be running out. Then arrange for the receptionist to get you a return appointment, at least 7 days before you run out of medicine. Do not wait until you have 1 or 2 pills left, to come in. This is very poor planning and does not take into consideration that we may need to cancel appointments due to bad weather, sickness, or emergencies affecting our staff.  "Partial Fill": If for any reason your pharmacy does not have enough pills/tablets to completely fill or refill your prescription, do not allow for a "partial fill". You will need a separate prescription to fill the remaining amount, which we will not provide. If the reason for the partial fill is your insurance, you will need to talk to the pharmacist about payment alternatives for the remaining tablets, but again, do not accept a partial fill.  Prescription refills and/or changes in medication(s):   Prescription refills, and/or changes in dose or medication, will be conducted only during scheduled medication management appointments. (Applies to both, written and electronic prescriptions.)  No refills on procedure days. No medication will be changed or started on procedure days. No changes, adjustments, and/or refills will be conducted on a procedure day. Doing so will interfere with the diagnostic portion of the procedure.  No phone refills. No medications will be "called into the pharmacy".  No Fax refills.  No weekend refills.  No Holliday refills.  No after hours  refills.  Remember:  Business hours are:  Monday to Thursday 8:00 AM to 4:00 PM Provider's Schedule: Crystal King, NP - Appointments are:  Medication management: Monday to Thursday 8:00 AM to 4:00 PM Jaqwan Wieber, MD - Appointments are:  Medication management: Monday and Wednesday 8:00 AM to 4:00 PM Procedure day: Tuesday and Thursday 7:30 AM to 4:00 PM Bilal Lateef, MD - Appointments are:  Medication management: Tuesday and Thursday 8:00 AM to 4:00 PM Procedure day: Monday and Wednesday 7:30 AM to 4:00 PM (Last update: 10/31/2017) ____________________________________________________________________________________________   ____________________________________________________________________________________________  CANNABIDIOL (AKA: CBD Oil or Pills)  Applies to: All patients receiving prescriptions of controlled substances (written and/or electronic).  General Information: Cannabidiol (CBD) was discovered in 1940. It is one of some 113 identified cannabinoids in cannabis (Marijuana) plants, accounting for up to 40% of the plant's extract. As of 2018, preliminary clinical research on cannabidiol included studies of anxiety, cognition, movement disorders, and pain.  Cannabidiol is consummed in multiple ways, including inhalation of cannabis smoke or vapor, as an aerosol spray into the cheek, and by mouth. It may be supplied as CBD oil containing CBD as the active ingredient (no added tetrahydrocannabinol (THC) or terpenes), a full-plant CBD-dominant hemp extract oil, capsules, dried cannabis, or as a liquid solution. CBD is thought not have the same psychoactivity as THC, and may affect the actions of THC. Studies suggest that CBD may interact with different biological targets, including cannabinoid receptors and other neurotransmitter receptors. As of 2018 the mechanism of action for its biological effects has not been determined.  In the [place], cannabidiol has a limited  approval by the Food and Drug Administration (FDA) for treatment of only two types   of epilepsy disorders. The side effects of long-term use of the drug include somnolence, decreased appetite, diarrhea, fatigue, malaise, weakness, sleeping problems, and others.  CBD remains a Schedule I drug prohibited for any use.  Legality: Some manufacturers ship CBD products nationally, an illegal action which the FDA has not enforced in 2018, with CBD remaining the subject of an FDA investigational new drug evaluation, and is not considered legal as a dietary supplement or food ingredient as of December 2018. Federal illegality has made it difficult historically to conduct research on CBD. CBD is openly sold in head shops and health food stores in some states where such sales have not been explicitly legalized.  Warning: Because it is not FDA approved for general use or treatment of pain, it is not required to undergo the same manufacturing controls as prescription drugs.  This means that the available cannabidiol (CBD) may be contaminated with THC.  If this is the case, it will trigger a positive urine drug screen (UDS) test for cannabinoids (Marijuana).  Because a positive UDS for illicit substances is a violation of our medication agreement, your opioid analgesics (pain medicine) may be permanently discontinued. (Last update: 11/21/2017) ____________________________________________________________________________________________    

## 2019-02-03 ENCOUNTER — Encounter: Payer: Medicare Other | Admitting: Nurse Practitioner

## 2019-02-12 ENCOUNTER — Other Ambulatory Visit: Payer: Self-pay

## 2019-02-12 ENCOUNTER — Emergency Department
Admission: EM | Admit: 2019-02-12 | Discharge: 2019-02-12 | Disposition: A | Discharge status: Home / Self Care | Payer: Medicare Other | Attending: Emergency Medicine | Admitting: Emergency Medicine

## 2019-02-12 ENCOUNTER — Encounter: Payer: Self-pay | Admitting: Emergency Medicine

## 2019-02-12 ENCOUNTER — Emergency Department: Payer: Medicare Other

## 2019-02-12 DIAGNOSIS — I252 Old myocardial infarction: Secondary | ICD-10-CM | POA: Diagnosis not present

## 2019-02-12 DIAGNOSIS — I25810 Atherosclerosis of coronary artery bypass graft(s) without angina pectoris: Secondary | ICD-10-CM | POA: Diagnosis not present

## 2019-02-12 DIAGNOSIS — Z87891 Personal history of nicotine dependence: Secondary | ICD-10-CM | POA: Diagnosis not present

## 2019-02-12 DIAGNOSIS — J449 Chronic obstructive pulmonary disease, unspecified: Secondary | ICD-10-CM | POA: Diagnosis not present

## 2019-02-12 DIAGNOSIS — I10 Essential (primary) hypertension: Secondary | ICD-10-CM | POA: Diagnosis not present

## 2019-02-12 DIAGNOSIS — M7532 Calcific tendinitis of left shoulder: Secondary | ICD-10-CM | POA: Diagnosis not present

## 2019-02-12 DIAGNOSIS — M25512 Pain in left shoulder: Secondary | ICD-10-CM | POA: Diagnosis not present

## 2019-02-12 DIAGNOSIS — Z79899 Other long term (current) drug therapy: Secondary | ICD-10-CM | POA: Insufficient documentation

## 2019-02-12 MED ORDER — MELOXICAM 7.5 MG PO TABS: 7.5000 mg | ORAL_TABLET | Freq: Every day | ORAL | 0 refills | Status: DC

## 2019-02-12 NOTE — ED Triage Notes (Signed)
Patient ambulatory to triage with steady gait, without difficulty or distress noted, mask in place; pt reports left shoulder pain for few days that increases with ROM; tender with palpation; pt denies any accomp symptoms or any radiating pain; denies any injury or hx of same

## 2019-02-12 NOTE — Discharge Instructions (Addendum)
Follow-up with schedule orthopedic appointment.  Wear arm sling as needed.

## 2019-02-12 NOTE — ED Provider Notes (Signed)
[facility]ERG[facility] DEPARTMENT Provider Note   CSN: [id number] Arrival date & time: 02/12/19  0543     History   Chief Complaint Chief Complaint  Patient presents with  . Shoulder Pain    HPI Aasim Restivo. is a 63 y.o. male.     HPI Patient presents with 2 to 3 days of atraumatic left shoulder pain.  Patient points to the [facility] joint as a source of pain.  Patient the pain increases with abduction and overhead reaching.  Patient denies numbness or tingling to the upper extremity.  Patient rates pain as a 6/10.  Patient scribed pain is "achy".  No palliative measure for complaint.  Patient has history of osteoarthritis of the hips. Past Medical History:  Diagnosis Date  . Benign esophageal stricture   . Cardiomyopathy, secondary ([place])   . Coronary artery disease   . Diverticulitis   . GI bleed   . Gout   . Hemorrhoids   . Hyperlipidemia   . Hypertension   . Sleep apnea   . Tubular adenoma of colon     Patient Active Problem List   Diagnosis Date Noted  . Osteoarthritis of hip (Left) 05/26/2018  . Spondylosis without myelopathy or radiculopathy, lumbosacral region 05/25/2018  . Other specified dorsopathies, sacral and sacrococcygeal region 05/25/2018  . Chronic low back pain (Bilateral) (R>L) 05/25/2018  . DDD (degenerative disc disease), lumbosacral 05/13/2018  . Lumbar facet arthropathy (Multilevel) (Bilateral) 05/13/2018  . Grade 1 Anterolisthesis of L4 on L5 05/13/2018  . Chronic pain syndrome 08/16/2016  . Pneumonia 05/28/2016  . Pressure injury of skin 05/28/2016  . Opioid-induced constipation (OIC) 05/17/2016  . Hypokalemia 05/01/2016  . 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 Carotid atherosclerosis 04/04/2016  . Hypoalbuminemia 04/04/2016  . Right sided weakness 04/02/2016  . S/P CABG (coronary artery bypass graft) 03/30/2016  . Chest pain 02/29/2016  . Unstable angina ([place]) 02/24/2016  . Keloid skin disorder 01/17/2016  .  Costochondritis 01/17/2016  . Long term current use of opiate analgesic 12/23/2015  . Long term prescription opiate use 12/23/2015  . Vitamin D insufficiency 11/21/2015  . Lumbar facet syndrome (Bilateral) (R>L) 11/21/2015  . Lumbar spondylosis 11/21/2015  . Elevated sedimentation rate 11/01/2015  . Chronic shoulder pain (Third area of Pain) (Right) 10/25/2015  . Chronic hip pain (Left) 10/25/2015  . History of alcoholism ([place]) 10/25/2015  . Opiate use 10/25/2015  . Encounter for therapeutic drug level monitoring 10/25/2015  . Chronic chest wall pain (Primary Area of Pain) (Incisional Midline) (since 04/22/2012) 10/25/2015  . Chronic lower extremity pain (Secondary area of Pain) (Bilateral) (R>L) 10/25/2015  . History of MI (myocardial infarction) (January 2014) 10/25/2015  . Encounter for pain management planning 10/25/2015  . Chronic sacroiliac joint pain (Bilateral) (L>R) 10/25/2015  . Neuropathic pain 10/25/2015  . Neurogenic pain 10/25/2015  . Incisional pain 10/25/2015  . Hyperlipidemia 03/16/2014  . H/O coronary artery bypass surgery 05/12/2012  . Alcohol withdrawal syndrome ([place]) 04/28/2012  . Injury of kidney 04/25/2012  . OSA (obstructive sleep apnea) 04/22/2012  . Hypertension 04/22/2012  . Coronary artery disease 04/22/2012  . Apnea, sleep 04/21/2012  . Chronic obstructive pulmonary disease ([place]) 04/21/2012  . COPD (chronic obstructive pulmonary disease) ([place]) 04/21/2012    Past Surgical History:  Procedure Laterality Date  . cardiac bypass    . CARDIAC CATHETERIZATION Left 02/03/2016   Procedure: Left Heart Cath and Coronary Angiography;  Surgeon: Teodoro Spray, MD;  Location: [facility]  CV LAB;  Service: Cardiovascular;  Laterality: Left;  . CARDIAC SURGERY    . COLONOSCOPY WITH PROPOFOL N/A 04/29/2015   Procedure: COLONOSCOPY WITH PROPOFOL;  Surgeon: Hulen Luster, MD;  Location: [facility] ENDOSCOPY;  Service: Gastroenterology;  Laterality: N/A;  . CORONARY ANGIOPLASTY  WITH STENT PLACEMENT     x2  . CORONARY ARTERY BYPASS GRAFT    . ESOPHAGOGASTRODUODENOSCOPY (EGD) WITH PROPOFOL N/A 04/29/2015   Procedure: ESOPHAGOGASTRODUODENOSCOPY (EGD) WITH PROPOFOL;  Surgeon: Hulen Luster, MD;  Location: [facility] ENDOSCOPY;  Service: Gastroenterology;  Laterality: N/A;  . SEPTOPLASTY          Home Medications    Prior to Admission medications   Medication Sig Start Date End Date Taking? Authorizing Provider  allopurinol (ZYLOPRIM) 300 MG tablet Take 100 mg by mouth daily. *patient takes in the afternoon*    [provider]  amLODipine (NORVASC) 10 MG tablet Take 5 mg by mouth daily.  07/23/16   [provider]  aspirin EC 81 MG tablet Take 81 mg by mouth every morning.    [provider]  atorvastatin (LIPITOR) 40 MG tablet Take 40 mg by mouth at bedtime.     [provider]  Cholecalciferol (VITAMIN D3) 2000 units TABS Take 2,000 Units by mouth every morning.    [provider]  gabapentin (NEURONTIN) 600 MG tablet Take 1 tablet (600 mg total) by mouth 2 (two) times daily. 02/10/19 05/11/19  Milinda Pointer, MD  hydrochlorothiazide (HYDRODIURIL) 25 MG tablet Take 25 mg by mouth daily.  07/23/16   [provider]  HYDROcodone-acetaminophen (NORCO/VICODIN) 5-325 MG tablet Take 1 tablet by mouth 2 (two) times daily as needed for up to 30 days for severe pain. Must last 30 days 04/11/19 05/11/19  Milinda Pointer, MD  HYDROcodone-acetaminophen (NORCO/VICODIN) 5-325 MG tablet Take 1 tablet by mouth 2 (two) times daily as needed for up to 30 days for severe pain. Must last 30 days 03/12/19 04/11/19  Milinda Pointer, MD  HYDROcodone-acetaminophen (NORCO/VICODIN) 5-325 MG tablet Take 1 tablet by mouth 2 (two) times daily as needed for up to 30 days for severe pain. Must last 30 days 02/10/19 03/12/19  Milinda Pointer, MD  Insulin Detemir (LEVEMIR FLEXTOUCH) 100 UNIT/ML Pen Inject 46 Units into the skin 2 (two) times daily.      [provider]  isosorbide mononitrate (IMDUR) 30 MG 24 hr tablet Take 30 mg by mouth every morning.     [provider]  linagliptin (TRADJENTA) 5 MG TABS tablet Take 5 mg by mouth daily.    [provider]  losartan (COZAAR) 100 MG tablet take 1 tablet by mouth once daily 08/02/16   [provider]  metoprolol (LOPRESSOR) 50 MG tablet Take 50 mg by mouth 3 (three) times daily.    [provider]  Multiple Vitamin (MULTIVITAMIN WITH MINERALS) TABS tablet Take 1 tablet by mouth every evening.    [provider]  nitroGLYCERIN (NITROSTAT) 0.4 MG SL tablet Place 0.4 mg under the tongue every 5 (five) minutes x 3 doses as needed for chest pain.  03/07/16   [provider]  OZEMPIC, 0.25 OR 0.5 MG/DOSE, 2 MG/1.5ML SOPN Inject 0.5 mg as directed once a week. 09/23/18   [provider]  Potassium 95 MG TABS Take 99 mg by mouth every morning.     [provider]    Family History Family History  Problem Relation Age of Onset  . Heart disease Mother   .  Hypertension Father     Social History Social History   Tobacco Use  . Smoking status: Former Smoker    Years: 25.00  . Smokeless tobacco: Former Network engineer Use Topics  . Alcohol use: Yes    Comment: occasional  . Drug use: No     Allergies   Patient has no known allergies.   Review of Systems Review of Systems   Physical Exam Updated Vital Signs BP (!) 136/101 (BP Location: Right Arm)   Pulse 92   Temp 98.3 F (36.8 C) (Oral)   Resp 20   Ht 5\' 3"  (1.6 m)   Wt 78.5 kg   SpO2 97%   BMI 30.65 kg/m   Physical Exam   ED Treatments / Results  Labs (all labs ordered are listed, but only abnormal results are displayed) Labs Reviewed - No data to display  EKG    Radiology No results found. Calcified lesion left shoulder Procedures Procedures (including critical care time)  Medications Ordered in ED Medications - No data to display    Initial Impression / Assessment and Plan / ED Course  I have reviewed the triage vital signs and the nursing notes.  Pertinent labs & imaging results that were available during my care of the patient were reviewed by me and considered in my medical decision making (see chart for details).        Left shoulder pain secondary to calcified tendinitis.  Discussed x-ray findings with patient.  Patient has a scheduled appointment orthopedic next week.  Patient given discharge care instruction advised take anti-inflammatory medication as directed.  Final Clinical Impressions(s) / ED Diagnoses   Final diagnoses:  None  Right shoulder pain secondary to calcified tendinitis.  ED Discharge Orders    None       Hewitt Blade 02/12/19 0801    Carrie Mew, MD 02/12/19 1436

## 2019-02-12 NOTE — ED Notes (Signed)
See triage note  Presents with left shoulder pain  States pain started 2 days ago w/o injury.no deformity noted   Having increased pain with movement

## 2019-04-30 ENCOUNTER — Telehealth: Payer: Self-pay | Admitting: *Deleted

## 2019-04-30 ENCOUNTER — Encounter: Payer: Self-pay | Admitting: Pain Medicine

## 2019-04-30 NOTE — Telephone Encounter (Signed)
Attempted to call for pre appointment asssessment. Message left.

## 2019-05-03 NOTE — Patient Instructions (Signed)
____________________________________________________________________________________________  Medication Recommendations and Reminders  Applies to: All patients receiving prescriptions (written and/or electronic).  Medication Rules & Regulations: These rules and regulations exist for your safety and that of others. They are not flexible and neither are we. Dismissing or ignoring them will be considered "non-compliance" with medication therapy, resulting in complete and irreversible termination of such therapy. (See document titled "Medication Rules" for more details.) In all conscience, because of safety reasons, we cannot continue providing a therapy where the patient does not follow instructions.  Pharmacy of record:   Definition: This is the pharmacy where your electronic prescriptions will be sent.   We do not endorse any particular pharmacy.  You are not restricted in your choice of pharmacy.  The pharmacy listed in the electronic medical record should be the one where you want electronic prescriptions to be sent.  If you choose to change pharmacy, simply notify our nursing staff of your choice of new pharmacy.  Recommendations:  Keep all of your pain medications in a safe place, under lock and key, even if you live alone.   After you fill your prescription, take 1 week's worth of pills and put them away in a safe place. You should keep a separate, properly labeled bottle for this purpose. The remainder should be kept in the original bottle. Use this as your primary supply, until it runs out. Once it's gone, then you know that you have 1 week's worth of medicine, and it is time to come in for a prescription refill. If you do this correctly, it is unlikely that you will ever run out of medicine.  To make sure that the above recommendation works, it is very important that you make sure your medication refill appointments are scheduled at least 1 week before you run out of medicine. To do  this in an effective manner, make sure that you do not leave the office without scheduling your next medication management appointment. Always ask the nursing staff to show you in your prescription , when your medication will be running out. Then arrange for the receptionist to get you a return appointment, at least 7 days before you run out of medicine. Do not wait until you have 1 or 2 pills left, to come in. This is very poor planning and does not take into consideration that we may need to cancel appointments due to bad weather, sickness, or emergencies affecting our staff.  "Partial Fill": If for any reason your pharmacy does not have enough pills/tablets to completely fill or refill your prescription, do not allow for a "partial fill". You will need a separate prescription to fill the remaining amount, which we will not provide. If the reason for the partial fill is your insurance, you will need to talk to the pharmacist about payment alternatives for the remaining tablets, but again, do not accept a partial fill.  Prescription refills and/or changes in medication(s):   Prescription refills, and/or changes in dose or medication, will be conducted only during scheduled medication management appointments. (Applies to both, written and electronic prescriptions.)  No refills on procedure days. No medication will be changed or started on procedure days. No changes, adjustments, and/or refills will be conducted on a procedure day. Doing so will interfere with the diagnostic portion of the procedure.  No phone refills. No medications will be "called into the pharmacy".  No Fax refills.  No weekend refills.  No [place] refills.  No after hours refills.  Remember:  Business   hours are:  Monday to Thursday 8:00 AM to 4:00 PM Provider's Schedule: Shanel Prazak, MD - Appointments are:  Medication management: Monday and Wednesday 8:00 AM to 4:00 PM Procedure day: Tuesday and Thursday 7:30 AM  to 4:00 PM Bilal Lateef, MD - Appointments are:  Medication management: Tuesday and Thursday 8:00 AM to 4:00 PM Procedure day: Monday and Wednesday 7:30 AM to 4:00 PM (Last update: 10/31/2017) ____________________________________________________________________________________________    

## 2019-05-03 NOTE — Progress Notes (Signed)
Pain Management Virtual Encounter Note - Virtual Visit via Telephone Telehealth (real-time audio visits between healthcare provider and patient).   Patient's Phone No. & Preferred Pharmacy:  [phone] (home); [phone] (mobile); (Preferred) [phone] [email or web address]  [facility] [id number], [address] [place] [address] Phone: [phone] Fax: [phone]    Pre-screening note:  Our staff contacted Mr. Menton and offered him an "in person", "face-to-face" appointment versus a telephone encounter. He indicated preferring the telephone encounter, at this time.   Reason for Virtual Visit: COVID-19*  Social distancing based on CDC and AMA recommendations.   I contacted Margaretha Seeds. on 05/04/2019 via telephone.      I clearly identified myself as Gaspar Cola, MD. I verified that I was speaking with the correct person using two identifiers (Name: Archie Little., and date of birth: May 11, 1956).  Advanced Informed Consent I sought verbal advanced consent from Margaretha Seeds. for virtual visit interactions. I informed Mr. Grieco of possible security and privacy concerns, risks, and limitations associated with providing "not-in-person" medical evaluation and management services. I also informed Mr. Muhammed of the availability of "in-person" appointments. Finally, I informed him that there would be a charge for the virtual visit and that he could be  personally, fully or partially, financially responsible for it. Mr. Lazzara expressed understanding and agreed to proceed.   Historic Elements   Mr. Harlow Mcdermott. is a 63 y.o. year old, male patient evaluated today after his last encounter by our practice on 04/30/2019. Mr. Brinks  has a past medical history of Benign esophageal stricture, Cardiomyopathy, secondary ([place]), Coronary artery disease, Diverticulitis, GI bleed, Gout,  Hemorrhoids, Hyperlipidemia, Hypertension, Sleep apnea, and Tubular adenoma of colon. He also  has a past surgical history that includes Coronary angioplasty with stent; cardiac bypass; Cardiac surgery; Coronary artery bypass graft; Colonoscopy with propofol (N/A, 04/29/2015); Esophagogastroduodenoscopy (egd) with propofol (N/A, 04/29/2015); Septoplasty; and Cardiac catheterization (Left, 02/03/2016). Mr. Brueggemann has a current medication list which includes the following prescription(s): allopurinol, amlodipine, aspirin ec, atorvastatin, vitamin d3, gabapentin, hydrochlorothiazide, hydrocodone-acetaminophen, hydrocodone-acetaminophen, hydrocodone-acetaminophen, insulin detemir, isosorbide mononitrate, linagliptin, losartan, metoprolol tartrate, multivitamin with minerals, nitroglycerin, ozempic (0.25 or 0.5 mg/dose), potassium, b-d uf iii mini pen needles, and paroex. He  reports that he has quit smoking. He quit after 25.00 years of use. He has quit using smokeless tobacco. He reports current alcohol use. He reports that he does not use drugs. Mr. Buxbaum has No Known Allergies.   HPI  Today, he is being contacted for medication management.  Pharmacotherapy Assessment  Analgesic: Hydrocodone/APAP 5/325 mg, 1 tab PO BID (10 mg/day of hydrocodone) MME/day: 10 mg/day.   Monitoring: Pharmacotherapy: No side-effects or adverse reactions reported. [place] PMP: PDMP reviewed during this encounter.       Compliance: No problems identified. Effectiveness: Clinically acceptable. Plan: Refer to "POC".  UDS:  Summary  Date Value Ref Range Status  05/06/2018 FINAL  Final    Comment:    ==================================================================== TOXASSURE SELECT 13 (MW) ==================================================================== Test                             Result       Flag       Units Drug Present and Declared for Prescription Verification   Hydrocodone  924           EXPECTED   ng/mg creat   Hydromorphone                  159          EXPECTED   ng/mg creat   Dihydrocodeine                 38           EXPECTED   ng/mg creat   Norhydrocodone                 636          EXPECTED   ng/mg creat    Sources of hydrocodone include scheduled prescription    medications. Hydromorphone, dihydrocodeine and norhydrocodone are    expected metabolites of hydrocodone. Hydromorphone and    dihydrocodeine are also available as scheduled prescription    medications. ==================================================================== Test                      Result    Flag   Units      Ref Range   Creatinine              245              mg/dL      >=20 ==================================================================== Declared Medications:  The flagging and interpretation on this report are based on the  following declared medications.  Unexpected results may arise from  inaccuracies in the declared medications.  **Note: The testing scope of this panel includes these medications:  Hydrocodone (Norco)  **Note: The testing scope of this panel does not include following  reported medications:  Acetaminophen (Norco)  Allopurinol (Zyloprim)  Amlodipine (Norvasc)  Aspirin  Atorvastatin (Lipitor)  Gabapentin (Neurontin)  Hydrochlorothiazide (Hydrodiuril)  Insulin (Levemir)  Isosorbide (Imdur)  Linagliptin (Tradjenta)  Losartan (Cozaar)  Metoprolol (Lopressor)  Multivitamin  Nitroglycerin (Nitrostat)  Potassium  Vitamin D3 ==================================================================== For clinical consultation, please call [phone]. ====================================================================    Laboratory Chemistry Profile (12 mo)  Renal: No results found for requested labs within last 8760 hours.  Lab Results  Component Value Date   GFRAA >60 06/16/2017   GFRNONAA >60 06/16/2017   Hepatic: No results found for requested labs  within last 8760 hours. Lab Results  Component Value Date   AST 63 (H) 12/04/2016   ALT 55 12/04/2016   Other: No results found for requested labs within last 8760 hours. Note: Above Lab results reviewed.  Imaging  Last 90 days:  Dg Shoulder Left  Result Date: 02/12/2019 CLINICAL DATA:  Left shoulder pain over the last few days which increases with movement. Tender to palpation. Initial encounter. EXAM: LEFT SHOULDER - 2+ VIEW COMPARISON:  None. FINDINGS: The left shoulder is located. No acute abnormality is present. Calcification anterior to the proximal humerus on the axillary view may be within the tendon. Visualized hemithorax is clear. IMPRESSION: 1. No acute abnormality. 2. Question calcific tendinitis along the deltoid. Electronically Signed   By: San Morelle M.D.   On: 02/12/2019 07:48    Assessment  The primary encounter diagnosis was Chronic pain syndrome. Diagnoses of Chronic chest wall pain (Primary Area of Pain) (Incisional Midline) (since 04/22/2012), Chronic lower extremity pain (Secondary area of Pain) (Bilateral) (R>L), Chronic shoulder pain (Third area of Pain) (Right), Neurogenic pain, Pharmacologic therapy, Disorder of skeletal system, and Problems influencing health status were also pertinent to this visit.  Plan of Care  I have discontinued Margaretha Seeds. "Tim"'s HYDROcodone-acetaminophen, HYDROcodone-acetaminophen, and meloxicam. I have also changed his HYDROcodone-acetaminophen. Additionally, I am having him start on HYDROcodone-acetaminophen and HYDROcodone-acetaminophen. Lastly, I am having him maintain his metoprolol tartrate, atorvastatin, isosorbide mononitrate, multivitamin with minerals, Potassium, nitroGLYCERIN, aspirin EC, Vitamin D3, hydrochlorothiazide, losartan, Insulin Detemir, linagliptin, Ozempic (0.25 or 0.5 MG/DOSE), amLODipine, allopurinol, B-D UF III MINI PEN NEEDLES, Paroex, and gabapentin.  Pharmacotherapy (Medications Ordered): Meds  ordered this encounter  Medications  . gabapentin (NEURONTIN) 600 MG tablet    Sig: Take 1 tablet (600 mg total) by mouth 2 (two) times daily.    Dispense:  60 tablet    Refill:  2    Fill one day early if pharmacy is closed on scheduled refill date. May substitute for generic if available.  Marland Kitchen HYDROcodone-acetaminophen (NORCO/VICODIN) 5-325 MG tablet    Sig: Take 1 tablet by mouth 2 (two) times daily as needed for severe pain. Must last 30 days    Dispense:  60 tablet    Refill:  0    Chronic Pain: STOP Act (Not applicable) Fill 1 day early if closed on refill date. Do not fill until: 05/11/2019. To last until: 06/10/2019. Avoid benzodiazepines within 8 hours of opioids  . HYDROcodone-acetaminophen (NORCO/VICODIN) 5-325 MG tablet    Sig: Take 1 tablet by mouth 2 (two) times daily as needed for severe pain. Must last 30 days    Dispense:  60 tablet    Refill:  0    Chronic Pain: STOP Act (Not applicable) Fill 1 day early if closed on refill date. Do not fill until: 06/10/2019. To last until: 07/10/2019. Avoid benzodiazepines within 8 hours of opioids  . HYDROcodone-acetaminophen (NORCO/VICODIN) 5-325 MG tablet    Sig: Take 1 tablet by mouth 2 (two) times daily as needed for severe pain. Must last 30 days    Dispense:  60 tablet    Refill:  0    Chronic Pain: STOP Act (Not applicable) Fill 1 day early if closed on refill date. Do not fill until: 07/10/2019. To last until: 08/09/2019. Avoid benzodiazepines within 8 hours of opioids   Orders:  Orders Placed This Encounter  Procedures  . ToxASSURE Select 13 (MW), Urine    Volume: 30 ml(s). Minimum 3 ml of urine is needed. Document temperature of fresh sample. Indications: Long term (current) use of opiate analgesic (Z79.891)  . Comp. Metabolic Panel (12)    With GFR. Indications: Chronic Pain Syndrome (G89.4) & Pharmacotherapy [id number])    Order Specific Question:   Has the patient fasted?    Answer:   No    Order Specific Question:   CC  Results    Answer:   PCP-NURSE [id number]  . Magnesium    Indication: Pharmacologic therapy [id number])    Order Specific Question:   CC Results    Answer:   PCP-NURSE [id number]  . Vitamin B12    Indication: Pharmacologic therapy [id number]).    Order Specific Question:   CC Results    Answer:   PCP-NURSE [id number]  . Sedimentation rate    Indication: Disorder of skeletal system (M89.9)    Order Specific Question:   CC Results    Answer:   PCP-NURSE [id number]  . 25-Hydroxyvitamin D Lcms D2+D3    Indication: Disorder of skeletal system (M89.9).    Order Specific Question:   CC Results    Answer:   PCP-NURSE [id number]  . C-reactive protein    Indication:  Problems influencing health status (Z78.9)    Order Specific Question:   CC Results    Answer:   PCP-NURSE [id number]   Follow-up plan:   Return in about 3 months (around 08/05/2019) for (VV), E/M, (MM).      Interventional management options: Planned, scheduled, and/or pending:   None at this time.   Considering:   Therapeutic bilateral Lumbar facet + bilateral sacroiliac joint RFA #1    Palliative PRN treatment(s):   Palliative left intra-articular hip joint injection #2  Palliative bilateral lumbar facet block #3  Palliative bilateral sacroiliac joint block #2     Recent Visits No visits were found meeting these conditions.  Showing recent visits within past 90 days and meeting all other requirements   Today's Visits Date Type Provider Dept  05/04/19 Office Visit Milinda Pointer, MD [facility]  Showing today's visits and meeting all other requirements   Future Appointments No visits were found meeting these conditions.  Showing future appointments within next 90 days and meeting all other requirements   I discussed the assessment and treatment plan with the patient. The patient was provided an opportunity to ask questions and all were answered. The patient agreed with the plan and demonstrated an understanding of  the instructions.  Patient advised to call back or seek an in-person evaluation if the symptoms or condition worsens.  Total duration of non-face-to-face encounter: 15 minutes.  Note by: Gaspar Cola, MD Date: 05/04/2019; Time: 1:09 PM  Note: This dictation was prepared with Dragon dictation. Any transcriptional errors that may result from this process are unintentional.  Disclaimer:  * Given the special circumstances of the COVID-19 pandemic, the federal government has announced that the Office for Civil Rights (OCR) will exercise its enforcement discretion and will not impose penalties on physicians using telehealth in the event of noncompliance with regulatory requirements under the [place] and [place] (HIPAA) in connection with the good faith provision of telehealth during the XX123456 national public health emergency. ([place])

## 2019-05-04 ENCOUNTER — Other Ambulatory Visit: Payer: Self-pay

## 2019-05-04 ENCOUNTER — Ambulatory Visit: Payer: Medicare Other | Attending: Pain Medicine | Admitting: Pain Medicine

## 2019-05-04 DIAGNOSIS — G894 Chronic pain syndrome: Secondary | ICD-10-CM | POA: Diagnosis not present

## 2019-05-04 DIAGNOSIS — Z79899 Other long term (current) drug therapy: Secondary | ICD-10-CM

## 2019-05-04 DIAGNOSIS — M899 Disorder of bone, unspecified: Secondary | ICD-10-CM | POA: Insufficient documentation

## 2019-05-04 DIAGNOSIS — M79604 Pain in right leg: Secondary | ICD-10-CM | POA: Diagnosis not present

## 2019-05-04 DIAGNOSIS — G8929 Other chronic pain: Secondary | ICD-10-CM

## 2019-05-04 DIAGNOSIS — M792 Neuralgia and neuritis, unspecified: Secondary | ICD-10-CM

## 2019-05-04 DIAGNOSIS — R0789 Other chest pain: Secondary | ICD-10-CM | POA: Diagnosis not present

## 2019-05-04 DIAGNOSIS — M25511 Pain in right shoulder: Secondary | ICD-10-CM | POA: Diagnosis not present

## 2019-05-04 DIAGNOSIS — Z789 Other specified health status: Secondary | ICD-10-CM

## 2019-05-04 DIAGNOSIS — M79605 Pain in left leg: Secondary | ICD-10-CM

## 2019-05-04 MED ORDER — HYDROCODONE-ACETAMINOPHEN 5-325 MG PO TABS: 1.0000 | ORAL_TABLET | Freq: Two times a day (BID) | ORAL | 0 refills | Status: DC | PRN

## 2019-05-04 MED ORDER — GABAPENTIN 600 MG PO TABS: 600.0000 mg | ORAL_TABLET | Freq: Two times a day (BID) | ORAL | 2 refills | Status: DC

## 2019-05-07 ENCOUNTER — Other Ambulatory Visit
Admission: RE | Admit: 2019-05-07 | Discharge: 2019-05-07 | Disposition: A | Discharge status: Home / Self Care | Payer: Medicare Other | Source: Ambulatory Visit | Attending: Pain Medicine | Admitting: Pain Medicine

## 2019-05-07 DIAGNOSIS — M899 Disorder of bone, unspecified: Secondary | ICD-10-CM | POA: Insufficient documentation

## 2019-05-07 DIAGNOSIS — Z789 Other specified health status: Secondary | ICD-10-CM | POA: Insufficient documentation

## 2019-05-07 DIAGNOSIS — G894 Chronic pain syndrome: Secondary | ICD-10-CM | POA: Diagnosis present

## 2019-05-07 DIAGNOSIS — Z79899 Other long term (current) drug therapy: Secondary | ICD-10-CM | POA: Insufficient documentation

## 2019-05-07 LAB — COMPREHENSIVE METABOLIC PANEL
ALT: 57 U/L — ABNORMAL HIGH (ref 0–44)
AST: 46 U/L — ABNORMAL HIGH (ref 15–41)
Albumin: 4.3 g/dL (ref 3.5–5.0)
Alkaline Phosphatase: 77 U/L (ref 38–126)
Anion gap: 13 (ref 5–15)
BUN: 21 mg/dL (ref 8–23)
CO2: 25 mmol/L (ref 22–32)
Calcium: 10.2 mg/dL (ref 8.9–10.3)
Chloride: 99 mmol/L (ref 98–111)
Creatinine, Ser: 1.39 mg/dL — ABNORMAL HIGH (ref 0.61–1.24)
GFR calc Af Amer: >60 mL/min (ref >60)
GFR calc non Af Amer: 53 mL/min — ABNORMAL LOW (ref >60)
Glucose, Bld: 115 mg/dL — ABNORMAL HIGH (ref 70–99)
Potassium: 4 mmol/L (ref 3.5–5.1)
Sodium: 137 mmol/L (ref 135–145)
Total Bilirubin: 1.1 mg/dL (ref 0.3–1.2)
Total Protein: 7.7 g/dL (ref 6.5–8.1)

## 2019-05-07 LAB — SEDIMENTATION RATE: Sed Rate: 41 mm/hr — ABNORMAL HIGH (ref 0–20)

## 2019-05-07 LAB — VITAMIN B12: Vitamin B-12: 191 pg/mL (ref 180–914)

## 2019-05-07 LAB — C-REACTIVE PROTEIN: CRP: 0.9 mg/dL (ref <1.0)

## 2019-05-07 LAB — MAGNESIUM: Magnesium: 1.9 mg/dL (ref 1.7–2.4)

## 2019-05-09 LAB — VITAMIN D 25 HYDROXY (VIT D DEFICIENCY, FRACTURES): Vit D, 25-Hydroxy: 74.7 ng/mL (ref 30.0–100.0)

## 2019-07-01 NOTE — Progress Notes (Signed)
Test: Blood sugar (Glucose levels) Finding(s): High (hyperglycemia) Normal Level(s): Normal fasting (NPO x 8 hours) glucose levels are between 65-99 mg/dl, with 2 hour fasting, levels are usually less than 140 mg/dl. Clinical significance: Any random blood glucose level greater than 200 mg/dl is considered to be Diabetes. Signs and symptoms may include: (when persistently above 180 mg/dL) Increased thirst; headaches; trouble concentrating; blurred vision; frequent peeing (urination); fatigue (weakness and tired feeling); weight loss. The most common and classical symptoms of an undiagnosed diabetes with hyperglycemia are: Increased urinary frequency (polyuria), thirst (polydipsia), hunger (polyphagia), and unexplained weight loss; numbness in the extremities, pain in feet (dysesthesias), fatigue, and blurred vision; recurrent or severe infections; loss of consciousness or severe nausea/vomiting (ketoacidosis) or coma. Patient Recommendation(s): Fasting levels above 140 mg/dL or any levels above 200 mg/dL should follow-up with PCP (Primary Care Physician) for further evaluation. ___________________________________________________________________________________ - Normal Creatinine levels are between 0.5 and 0.9 mg/dl for our lab. Any condition that impairs the function of the kidneys is likely to raise the creatinine level in the blood. The most common causes of longstanding (chronic) kidney disease in adults are high blood pressure and diabetes. Other causes of elevated blood creatinine levels include drugs, ingestion of a large amount of dietary meat, kidney infections, rhabdomyolysis (abnormal muscle breakdown), and urinary tract obstruction. ___________________________________________________________________________________ - The normal range for ALT (SGPT) values is about 7 to 56 units per liter. Muscle injections and/or strenuous exercise, may increase alanine aminotransferase (ALT) levels. Many drugs  may raise levels by causing liver damage. Other causes of moderate increases include bile duct obstruction, cirrhosis, heart damage, alcohol abuse, and liver tumors. In most types of liver diseases, the ALT level is higher than AST, leading to a low AST/ALT ratio( >1). Exceptions include alcoholic or acute hepatitis, cirrhosis, as well as heart and/or muscle injury. Levels  (>10 X normal) may be seen with acute hepatitis while results (sometimes >100 X normal) may indicate liver exposure to toxic substances. ___________________________________________________________________________________ - Normal levels of AST are between 5 and 40 U/L. Pregnancy, a muscle injection, or even strenuous exercise may increase AST levels. Acute burns, surgery, and seizures may raise AST levels as well. Very high levels of AST (> 10 X normal) are usually due to acute hepatitis. Levels > 100 X normal can be seen with liver exposure to hepatotoxic substances. Moderate increases may be seen in other diseases of the liver, especially when the bile ducts are blocked, or with cirrhosis or certain cancers of the liver. AST may also increase after heart attacks and with muscle injury, usually to a much greater degree than ALT. In most types of liver disease, the ALT level is higher than AST and the AST/ALT ratio will be low (less than 1). With heart or muscle injury, AST is often much higher than ALT (often 3-5 times as high) and levels tend to stay higher than ALT for longer than with liver injury. ___________________________________________________________________________________ - AST and ALT are considered to be two of the most important tests to detect liver injury, although ALT is more specific for the liver than is AST and is more commonly increased than is AST. Sometimes AST is compared directly to ALT and an AST/ALT ratio is calculated. This ratio may be used to distinguish between different causes of liver damage and to  distinguish liver injury from damage to heart or muscle - In most types of liver disease, the ALT level is higher than AST (AST/ALT ratio < 1). There are a  few exceptions; the AST/ALT ratio is usually increased in alcoholic hepatitis, cirrhosis, hepatitis C virus-related chronic liver disease, and in the first day or two of acute hepatitis or injury from bile duct obstruction. With heart or muscle injury, AST is often much higher than ALT (often 3-5 times as high) and levels tend to stay higher than ALT for longer than with liver injury. - When the AST is higher than ALT (AST/ALT Ratio > 1), a muscle source of these enzymes should be considered. For example, muscle inflammation due to dermatomyositis may cause AST>ALT. This is a good reminder that AST and ALT are not good measures of liver function because they do not reliably reflect the synthetic ability of the liver and they may come from tissues other than liver (such as muscle) ___________________________________________________________________________________ eGFR (Estimated Glomerular Filtration Rate) results are reported as milliliters/minute/1.54m (mL/min/1.724m. Because some laboratories do not collect information on a patient's race when the sample is collected for testing, they may report calculated results for both African Americans and non-African Americans.  The NaN[facility] [employer] Insurance[facility] Centersuggests only reporting actual results once values are < 60 mL/min. 1. Normal values: 90-120 mL/min 2. Below 60 mL/min suggests that some kidney damage has occurred. 3. Between 5952nd 30 indicate (Moderate) Stage 3 kidney disease. 4. Between 29 and 15 represent (Severe) Stage 4 kidney disease. 5. Less than 15 is considered (Kidney Failure) Stage 5. ___________________________________________________________________________________

## 2019-07-01 NOTE — Progress Notes (Signed)
Test: ESR (Erythrocyte Sedimentation Rate) levels Finding(s): Elevated  Normal Level(s): below 30 mm/hr. Clinical significance: The sed rate is an acute phase reactant that indirectly measures the degree of inflammation present in the body. It can be acute, developing rapidly after trauma, injury or infection, for example, or can occur over an extended time (chronic) with conditions such as autoimmune diseases or cancer. The ESR is not diagnostic; it is a non-specific, screening test that may be elevated in a number of these different conditions. It provides general information about the presence or absence of an inflammatory condition. Signs and symptoms may include: None Possible causes:  - Most common: inflammation Patient Recommendation(s): Unless contraindicated, consider the use of anti-inflammatory diet and medications. (visit http://inflammationfactor.com/ ) ___________________________________________________________________________________ 

## 2019-08-04 ENCOUNTER — Encounter: Payer: Self-pay | Admitting: Pain Medicine

## 2019-08-04 NOTE — Progress Notes (Signed)
Pain Management Virtual Encounter Note - Virtual Visit via Telephone Telehealth (real-time audio visits between healthcare provider and patient).   Patient's Phone No. & Preferred Pharmacy:  [phone] (home); [phone] (mobile); (Preferred) [phone] [name] [name]  [facility] [id number] [place] [address] Phone: [phone] Fax: [phone]    Pre-screening note:  Our staff contacted [name] [name] and offered him an "in person", "face-to-face" appointment versus a telephone encounter. He indicated preferring the telephone encounter, at this time.   Reason for Virtual Visit: COVID-19*  Social distancing based on CDC and AMA recommendations.   I contacted [name] [name]. on 08/05/2019 via telephone.      I clearly identified myself as Gaspar Cola, MD. I verified that I was speaking with the correct person using two identifiers (Name: [name] Cefalo., and date of birth: 05/18/1956).  Advanced Informed Consent I sought verbal advanced consent from [name] [name]. for virtual visit interactions. I informed [name] [name] of possible security and privacy concerns, risks, and limitations associated with providing "not-in-person" medical evaluation and management services. I also informed [name] [name] of the availability of "in-person" appointments. Finally, I informed him that there would be a charge for the virtual visit and that he could be  personally, fully or partially, financially responsible for it. [name] [name] expressed understanding and agreed to proceed.   Historic Elements   [name] is a 63 y.o. year old, male patient evaluated today after his last encounter by our practice on 05/04/2019. [name] [name]  has a past medical history of Benign esophageal stricture, Cardiomyopathy, secondary ([name]), Coronary artery disease, Diverticulitis, GI bleed, Gout,  Hemorrhoids, Hyperlipidemia, Hypertension, Sleep apnea, and Tubular adenoma of colon. He also  has a past surgical history that includes Coronary angioplasty with stent; cardiac bypass; Cardiac surgery; Coronary artery bypass graft; Colonoscopy with propofol (N/A, 04/29/2015); Esophagogastroduodenoscopy (egd) with propofol (N/A, 04/29/2015); Septoplasty; and Cardiac catheterization (Left, 02/03/2016). [name] [name] has a current medication list which includes the following prescription(s): allopurinol, aspirin ec, atorvastatin, b-d uf iii mini pen needles, vitamin d3, gabapentin, hydrochlorothiazide, hydrocodone-acetaminophen, hydrocodone-acetaminophen, hydrocodone-acetaminophen, insulin detemir, isosorbide mononitrate, linagliptin, losartan, metoprolol tartrate, multivitamin with minerals, nitroglycerin, ozempic (0.25 or 0.5 mg/dose), and potassium. He  reports that he has quit smoking. He quit after 25.00 years of use. He has quit using smokeless tobacco. He reports current alcohol use. He reports that he does not use drugs. [name] [name] has No Known Allergies.   HPI  Today, he is being contacted for medication management.  The patient indicates doing well with the current medication regimen. No adverse reactions or side effects reported to the medications.   Pharmacotherapy Assessment  Analgesic: Hydrocodone/APAP 5/325 mg, 1 tab PO BID (10 mg/day of hydrocodone) MME/day: 10 mg/day.   Monitoring: Pharmacotherapy: No side-effects or adverse reactions reported. [place] PMP: PDMP reviewed during this encounter.       Compliance: No problems identified. Effectiveness: Clinically acceptable. Plan: Refer to "POC".  UDS:  Summary  Date Value Ref Range Status  05/06/2018 FINAL  Final    Comment:    ==================================================================== TOXASSURE SELECT 13 (MW) ==================================================================== Test                             Result       Flag  Units Drug Present and Declared for Prescription Verification   Hydrocodone                    924          EXPECTED   ng/mg creat   Hydromorphone                  159          EXPECTED   ng/mg creat   Dihydrocodeine                 38           EXPECTED   ng/mg creat   Norhydrocodone                 636          EXPECTED   ng/mg creat    Sources of hydrocodone include scheduled prescription    medications. Hydromorphone, dihydrocodeine and norhydrocodone are    expected metabolites of hydrocodone. Hydromorphone and    dihydrocodeine are also available as scheduled prescription    medications. ==================================================================== Test                      Result    Flag   Units      Ref Range   Creatinine              245              mg/dL      >=20 ==================================================================== Declared Medications:  The flagging and interpretation on this report are based on the  following declared medications.  Unexpected results may arise from  inaccuracies in the declared medications.  **Note: The testing scope of this panel includes these medications:  Hydrocodone (Norco)  **Note: The testing scope of this panel does not include following  reported medications:  Acetaminophen (Norco)  Allopurinol (Zyloprim)  Amlodipine (Norvasc)  Aspirin  Atorvastatin (Lipitor)  Gabapentin (Neurontin)  Hydrochlorothiazide (Hydrodiuril)  Insulin (Levemir)  Isosorbide (Imdur)  Linagliptin (Tradjenta)  Losartan (Cozaar)  Metoprolol (Lopressor)  Multivitamin  Nitroglycerin (Nitrostat)  Potassium  Vitamin D3 ==================================================================== For clinical consultation, please call [phone]. ====================================================================    Laboratory Chemistry Profile (12 mo)  Renal: 05/07/2019: BUN 21; Creatinine, Ser 1.39  Lab Results  Component Value Date   GFRAA  >60 05/07/2019   GFRNONAA 53 (L) 05/07/2019   Hepatic: 05/07/2019: Albumin 4.3 Lab Results  Component Value Date   AST 46 (H) 05/07/2019   ALT 57 (H) 05/07/2019   Other: 05/07/2019: CRP 0.9; Sed Rate 41; Vit D, 25-Hydroxy 74.7; Vitamin B-12 191 Note: Above Lab results reviewed.  Imaging  DG Shoulder Left CLINICAL DATA:  Left shoulder pain over the last few days which increases with movement. Tender to palpation. Initial encounter.  EXAM: LEFT SHOULDER - 2+ VIEW  COMPARISON:  None.  FINDINGS: The left shoulder is located. No acute abnormality is present. Calcification anterior to the proximal humerus on the axillary view may be within the tendon. Visualized hemithorax is clear.  IMPRESSION: 1. No acute abnormality. 2. Question calcific tendinitis along the deltoid.  Electronically Signed   By: San Morelle M.D.   On: 02/12/2019 07:48   Assessment  The primary encounter diagnosis was Chronic pain syndrome. Diagnoses of Chronic chest wall pain (Primary Area of Pain) (Incisional Midline) (since 04/22/2012), Chronic lower extremity pain (Secondary area of Pain) (Bilateral) (R>L), and Neurogenic pain were also pertinent to this visit.  Plan of Care  Problem-specific:  No problem-specific Assessment & Plan notes found for this encounter.  I have discontinued [name] [name]. "[name] [name]"'s amLODipine and Paroex. I am also having him start on HYDROcodone-acetaminophen and HYDROcodone-acetaminophen. Additionally, I am having him maintain his metoprolol tartrate, atorvastatin, isosorbide mononitrate, multivitamin with minerals, Potassium, nitroGLYCERIN, aspirin EC, Vitamin D3, hydrochlorothiazide, losartan, Insulin Detemir, linagliptin, Ozempic (0.25 or 0.5 MG/DOSE), allopurinol, B-D UF III MINI PEN NEEDLES, gabapentin, and HYDROcodone-acetaminophen.  Pharmacotherapy (Medications Ordered): Meds ordered this encounter  Medications  . gabapentin (NEURONTIN) 600 MG tablet    Sig:  Take 1 tablet (600 mg total) by mouth 2 (two) times daily.    Dispense:  60 tablet    Refill:  5    Fill one day early if pharmacy is closed on scheduled refill date. May substitute for generic if available.  Marland Kitchen HYDROcodone-acetaminophen (NORCO/VICODIN) 5-325 MG tablet    Sig: Take 1 tablet by mouth 2 (two) times daily as needed for severe pain. Must last 30 days    Dispense:  60 tablet    Refill:  0    Chronic Pain: STOP Act (Not applicable) Fill 1 day early if closed on refill date. Do not fill until: 08/09/2019. To last until: 09/08/2019. Avoid benzodiazepines within 8 hours of opioids  . HYDROcodone-acetaminophen (NORCO/VICODIN) 5-325 MG tablet    Sig: Take 1 tablet by mouth 2 (two) times daily as needed for severe pain. Must last 30 days    Dispense:  60 tablet    Refill:  0    Chronic Pain: STOP Act (Not applicable) Fill 1 day early if closed on refill date. Do not fill until: 09/08/2019. To last until: 10/08/2019. Avoid benzodiazepines within 8 hours of opioids  . HYDROcodone-acetaminophen (NORCO/VICODIN) 5-325 MG tablet    Sig: Take 1 tablet by mouth 2 (two) times daily as needed for severe pain. Must last 30 days    Dispense:  60 tablet    Refill:  0    Chronic Pain: STOP Act (Not applicable) Fill 1 day early if closed on refill date. Do not fill until: 10/08/2019. To last until: 11/07/2019. Avoid benzodiazepines within 8 hours of opioids   Orders:  No orders of the defined types were placed in this encounter.  Follow-up plan:   Return in about 13 weeks (around 11/04/2019) for (VV), (MM).      Interventional management options: Planned, scheduled, and/or pending:   None at this time.   Considering:   Therapeutic bilateral Lumbar facet + bilateral sacroiliac joint RFA #1    Palliative PRN treatment(s):   Palliative left intra-articular hip joint injection #2  Palliative bilateral lumbar facet block #3  Palliative bilateral sacroiliac joint block #2      Recent Visits No visits were  found meeting these conditions.  Showing recent visits within past 90 days and meeting all other requirements   Today's Visits Date Type Provider Dept  08/05/19 Telemedicine Milinda Pointer, MD [facility]  Showing today's visits and meeting all other requirements   Future Appointments No visits were found meeting these conditions.  Showing future appointments within next 90 days and meeting all other requirements   I discussed the assessment and treatment plan with the patient. The patient was provided an opportunity to ask questions and all were answered. The patient agreed with the plan and demonstrated an understanding of the instructions.  Patient advised to call back or seek an in-person evaluation if the symptoms or condition worsens.  Total duration of non-face-to-face encounter: 15 minutes.  Note by: Gaspar Cola, MD Date: 08/05/2019; Time: 3:01 PM  Note: This dictation was prepared with Dragon dictation. Any transcriptional errors that may result from this process are unintentional.  Disclaimer:  * Given the special circumstances of the COVID-19 pandemic, the federal government has announced that the Office for Civil Rights (OCR) will exercise its enforcement discretion and will not impose penalties on physicians using telehealth in the event of noncompliance with regulatory requirements under the [place] (HIPAA) in connection with the good faith provision of telehealth during the XX123456 national public health emergency. ([place])

## 2019-08-05 ENCOUNTER — Other Ambulatory Visit: Payer: Self-pay

## 2019-08-05 ENCOUNTER — Ambulatory Visit: Payer: Medicare Other | Attending: Pain Medicine | Admitting: Pain Medicine

## 2019-08-05 DIAGNOSIS — G894 Chronic pain syndrome: Secondary | ICD-10-CM

## 2019-08-05 DIAGNOSIS — M79604 Pain in right leg: Secondary | ICD-10-CM

## 2019-08-05 DIAGNOSIS — R0789 Other chest pain: Secondary | ICD-10-CM | POA: Diagnosis not present

## 2019-08-05 DIAGNOSIS — M792 Neuralgia and neuritis, unspecified: Secondary | ICD-10-CM | POA: Diagnosis not present

## 2019-08-05 DIAGNOSIS — G8929 Other chronic pain: Secondary | ICD-10-CM

## 2019-08-05 DIAGNOSIS — M79605 Pain in left leg: Secondary | ICD-10-CM

## 2019-08-05 MED ORDER — HYDROCODONE-ACETAMINOPHEN 5-325 MG PO TABS: 1.0000 | ORAL_TABLET | Freq: Two times a day (BID) | ORAL | 0 refills | Status: DC | PRN

## 2019-08-05 MED ORDER — GABAPENTIN 600 MG PO TABS: 600.0000 mg | ORAL_TABLET | Freq: Two times a day (BID) | ORAL | 5 refills | Status: DC

## 2019-08-05 NOTE — Patient Instructions (Signed)
____________________________________________________________________________________________  Medication Rules  Purpose: To inform patients, and their family members, of our rules and regulations.  Applies to: All patients receiving prescriptions (written or electronic).  Pharmacy of record: Pharmacy where electronic prescriptions will be sent. If written prescriptions are taken to a different pharmacy, please inform the nursing staff. The pharmacy listed in the electronic medical record should be the one where you would like electronic prescriptions to be sent.  Electronic prescriptions: In compliance with the [place] Strengthen Opioid Misuse Prevention (STOP) Act of 2017 (Session Law 2017-74/H243), effective September 03, 2018, all controlled substances must be electronically prescribed. Calling prescriptions to the pharmacy will cease to exist.  Prescription refills: Only during scheduled appointments. Applies to all prescriptions.  NOTE: The following applies primarily to controlled substances (Opioid* Pain Medications).   Patient's responsibilities: 1. Pain Pills: Bring all pain pills to every appointment (except for procedure appointments). 2. Pill Bottles: Bring pills in original pharmacy bottle. Always bring the newest bottle. Bring bottle, even if empty. 3. Medication refills: You are responsible for knowing and keeping track of what medications you take and those you need refilled. The day before your appointment: write a list of all prescriptions that need to be refilled. The day of the appointment: give the list to the admitting nurse. Prescriptions will be written only during appointments. No prescriptions will be written on procedure days. If you forget a medication: it will not be "Called in", "Faxed", or "electronically sent". You will need to get another appointment to get these prescribed. No early refills. Do not call asking to have your prescription filled  early. 4. Prescription Accuracy: You are responsible for carefully inspecting your prescriptions before leaving our office. Have the discharge nurse carefully go over each prescription with you, before taking them home. Make sure that your name is accurately spelled, that your address is correct. Check the name and dose of your medication to make sure it is accurate. Check the number of pills, and the written instructions to make sure they are clear and accurate. Make sure that you are given enough medication to last until your next medication refill appointment. 5. Taking Medication: Take medication as prescribed. When it comes to controlled substances, taking less pills or less frequently than prescribed is permitted and encouraged. Never take more pills than instructed. Never take medication more frequently than prescribed.  6. Inform other Doctors: Always inform, all of your healthcare providers, of all the medications you take. 7. Pain Medication from other Providers: You are not allowed to accept any additional pain medication from any other Doctor or Healthcare provider. There are two exceptions to this rule. (see below) In the event that you require additional pain medication, you are responsible for notifying us, as stated below. 8. Medication Agreement: You are responsible for carefully reading and following our Medication Agreement. This must be signed before receiving any prescriptions from our practice. Safely store a copy of your signed Agreement. Violations to the Agreement will result in no further prescriptions. (Additional copies of our Medication Agreement are available upon request.) 9. Laws, Rules, & Regulations: All patients are expected to follow all Federal and State Laws, Statutes, Rules, & Regulations. Ignorance of the Laws does not constitute a valid excuse. The use of any illegal substances is prohibited. 10. Adopted CDC guidelines & recommendations: Target dosing levels will be  at or below 60 MME/day. Use of benzodiazepines** is not recommended.  Exceptions: There are only two exceptions to the rule of not   receiving pain medications from other Healthcare Providers. 1. Exception #1 (Emergencies): In the event of an emergency (i.e.: accident requiring emergency care), you are allowed to receive additional pain medication. However, you are responsible for: As soon as you are able, call our office [phone], at any time of the day or night, and leave a message stating your name, the date and nature of the emergency, and the name and dose of the medication prescribed. In the event that your call is answered by a member of our staff, make sure to document and save the date, time, and the name of the person that took your information.  2. Exception #2 (Planned Surgery): In the event that you are scheduled by another doctor or dentist to have any type of surgery or procedure, you are allowed (for a period no longer than 30 days), to receive additional pain medication, for the acute post-op pain. However, in this case, you are responsible for picking up a copy of our "Post-op Pain Management for Surgeons" handout, and giving it to your surgeon or dentist. This document is available at our office, and does not require an appointment to obtain it. Simply go to our office during business hours (Monday-Thursday from 8:00 AM to 4:00 PM) (Friday 8:00 AM to 12:00 Noon) or if you have a scheduled appointment with us, prior to your surgery, and ask for it by name. In addition, you will need to provide us with your name, name of your surgeon, type of surgery, and date of procedure or surgery.  *Opioid medications include: morphine, codeine, oxycodone, oxymorphone, hydrocodone, hydromorphone, meperidine, tramadol, tapentadol, buprenorphine, fentanyl, methadone. **Benzodiazepine medications include: diazepam (Valium), alprazolam (Xanax), clonazepam (Klonopine), lorazepam (Ativan), clorazepate  (Tranxene), chlordiazepoxide (Librium), estazolam (Prosom), oxazepam (Serax), temazepam (Restoril), triazolam (Halcion) (Last updated: 10/31/2017) ____________________________________________________________________________________________   ____________________________________________________________________________________________  Medication Recommendations and Reminders  Applies to: All patients receiving prescriptions (written and/or electronic).  Medication Rules & Regulations: These rules and regulations exist for your safety and that of others. They are not flexible and neither are we. Dismissing or ignoring them will be considered "non-compliance" with medication therapy, resulting in complete and irreversible termination of such therapy. (See document titled "Medication Rules" for more details.) In all conscience, because of safety reasons, we cannot continue providing a therapy where the patient does not follow instructions.  Pharmacy of record:   Definition: This is the pharmacy where your electronic prescriptions will be sent.   We do not endorse any particular pharmacy.  You are not restricted in your choice of pharmacy.  The pharmacy listed in the electronic medical record should be the one where you want electronic prescriptions to be sent.  If you choose to change pharmacy, simply notify our nursing staff of your choice of new pharmacy.  Recommendations:  Keep all of your pain medications in a safe place, under lock and key, even if you live alone.   After you fill your prescription, take 1 week's worth of pills and put them away in a safe place. You should keep a separate, properly labeled bottle for this purpose. The remainder should be kept in the original bottle. Use this as your primary supply, until it runs out. Once it's gone, then you know that you have 1 week's worth of medicine, and it is time to come in for a prescription refill. If you do this correctly, it  is unlikely that you will ever run out of medicine.  To make sure that the above recommendation works,   it is very important that you make sure your medication refill appointments are scheduled at least 1 week before you run out of medicine. To do this in an effective manner, make sure that you do not leave the office without scheduling your next medication management appointment. Always ask the nursing staff to show you in your prescription , when your medication will be running out. Then arrange for the receptionist to get you a return appointment, at least 7 days before you run out of medicine. Do not wait until you have 1 or 2 pills left, to come in. This is very poor planning and does not take into consideration that we may need to cancel appointments due to bad weather, sickness, or emergencies affecting our staff.  "Partial Fill": If for any reason your pharmacy does not have enough pills/tablets to completely fill or refill your prescription, do not allow for a "partial fill". You will need a separate prescription to fill the remaining amount, which we will not provide. If the reason for the partial fill is your insurance, you will need to talk to the pharmacist about payment alternatives for the remaining tablets, but again, do not accept a partial fill.  Prescription refills and/or changes in medication(s):   Prescription refills, and/or changes in dose or medication, will be conducted only during scheduled medication management appointments. (Applies to both, written and electronic prescriptions.)  No refills on procedure days. No medication will be changed or started on procedure days. No changes, adjustments, and/or refills will be conducted on a procedure day. Doing so will interfere with the diagnostic portion of the procedure.  No phone refills. No medications will be "called into the pharmacy".  No Fax refills.  No weekend refills.  No Holliday refills.  No after hours  refills.  Remember:  Business hours are:  Monday to Thursday 8:00 AM to 4:00 PM Provider's Schedule: Tameem Pullara, MD - Appointments are:  Medication management: Monday and Wednesday 8:00 AM to 4:00 PM Procedure day: Tuesday and Thursday 7:30 AM to 4:00 PM Bilal Lateef, MD - Appointments are:  Medication management: Tuesday and Thursday 8:00 AM to 4:00 PM Procedure day: Monday and Wednesday 7:30 AM to 4:00 PM (Last update: 10/31/2017) ____________________________________________________________________________________________   ____________________________________________________________________________________________  CANNABIDIOL (AKA: CBD Oil or Pills)  Applies to: All patients receiving prescriptions of controlled substances (written and/or electronic).  General Information: Cannabidiol (CBD) was discovered in 1940. It is one of some 113 identified cannabinoids in cannabis (Marijuana) plants, accounting for up to 40% of the plant's extract. As of 2018, preliminary clinical research on cannabidiol included studies of anxiety, cognition, movement disorders, and pain.  Cannabidiol is consummed in multiple ways, including inhalation of cannabis smoke or vapor, as an aerosol spray into the cheek, and by mouth. It may be supplied as CBD oil containing CBD as the active ingredient (no added tetrahydrocannabinol (THC) or terpenes), a full-plant CBD-dominant hemp extract oil, capsules, dried cannabis, or as a liquid solution. CBD is thought not have the same psychoactivity as THC, and may affect the actions of THC. Studies suggest that CBD may interact with different biological targets, including cannabinoid receptors and other neurotransmitter receptors. As of 2018 the mechanism of action for its biological effects has not been determined.  In the [place], cannabidiol has a limited approval by the Food and Drug Administration (FDA) for treatment of only two types of epilepsy  disorders. The side effects of long-term use of the drug include somnolence, decreased appetite, diarrhea,   fatigue, malaise, weakness, sleeping problems, and others.  CBD remains a Schedule I drug prohibited for any use.  Legality: Some manufacturers ship CBD products nationally, an illegal action which the FDA has not enforced in 2018, with CBD remaining the subject of an FDA investigational new drug evaluation, and is not considered legal as a dietary supplement or food ingredient as of December 2018. Federal illegality has made it difficult historically to conduct research on CBD. CBD is openly sold in head shops and health food stores in some states where such sales have not been explicitly legalized.  Warning: Because it is not FDA approved for general use or treatment of pain, it is not required to undergo the same manufacturing controls as prescription drugs.  This means that the available cannabidiol (CBD) may be contaminated with THC.  If this is the case, it will trigger a positive urine drug screen (UDS) test for cannabinoids (Marijuana).  Because a positive UDS for illicit substances is a violation of our medication agreement, your opioid analgesics (pain medicine) may be permanently discontinued. (Last update: 11/21/2017) ____________________________________________________________________________________________    

## 2019-08-10 ENCOUNTER — Other Ambulatory Visit: Payer: Self-pay

## 2019-08-10 ENCOUNTER — Encounter: Payer: Self-pay | Admitting: Emergency Medicine

## 2019-08-10 ENCOUNTER — Emergency Department
Admission: EM | Admit: 2019-08-10 | Discharge: 2019-08-10 | Disposition: A | Discharge status: Home / Self Care | Payer: Medicare Other | Attending: Student in an Organized Health Care Education/Training Program | Admitting: Student in an Organized Health Care Education/Training Program

## 2019-08-10 DIAGNOSIS — Z87891 Personal history of nicotine dependence: Secondary | ICD-10-CM | POA: Insufficient documentation

## 2019-08-10 DIAGNOSIS — Z79899 Other long term (current) drug therapy: Secondary | ICD-10-CM | POA: Diagnosis not present

## 2019-08-10 DIAGNOSIS — Y998 Other external cause status: Secondary | ICD-10-CM | POA: Insufficient documentation

## 2019-08-10 DIAGNOSIS — Z794 Long term (current) use of insulin: Secondary | ICD-10-CM | POA: Diagnosis not present

## 2019-08-10 DIAGNOSIS — S61211A Laceration without foreign body of left index finger without damage to nail, initial encounter: Secondary | ICD-10-CM | POA: Insufficient documentation

## 2019-08-10 DIAGNOSIS — I2510 Atherosclerotic heart disease of native coronary artery without angina pectoris: Secondary | ICD-10-CM | POA: Diagnosis not present

## 2019-08-10 DIAGNOSIS — Z7982 Long term (current) use of aspirin: Secondary | ICD-10-CM | POA: Insufficient documentation

## 2019-08-10 DIAGNOSIS — Y92010 Kitchen of single-family (private) house as the place of occurrence of the external cause: Secondary | ICD-10-CM | POA: Insufficient documentation

## 2019-08-10 DIAGNOSIS — J449 Chronic obstructive pulmonary disease, unspecified: Secondary | ICD-10-CM | POA: Diagnosis not present

## 2019-08-10 DIAGNOSIS — Z951 Presence of aortocoronary bypass graft: Secondary | ICD-10-CM | POA: Diagnosis not present

## 2019-08-10 DIAGNOSIS — W260XXA Contact with knife, initial encounter: Secondary | ICD-10-CM | POA: Diagnosis not present

## 2019-08-10 DIAGNOSIS — Z23 Encounter for immunization: Secondary | ICD-10-CM | POA: Insufficient documentation

## 2019-08-10 DIAGNOSIS — Z955 Presence of coronary angioplasty implant and graft: Secondary | ICD-10-CM | POA: Diagnosis not present

## 2019-08-10 DIAGNOSIS — I10 Essential (primary) hypertension: Secondary | ICD-10-CM | POA: Diagnosis not present

## 2019-08-10 DIAGNOSIS — I252 Old myocardial infarction: Secondary | ICD-10-CM | POA: Insufficient documentation

## 2019-08-10 DIAGNOSIS — Y93G1 Activity, food preparation and clean up: Secondary | ICD-10-CM | POA: Insufficient documentation

## 2019-08-10 DIAGNOSIS — S6992XA Unspecified injury of left wrist, hand and finger(s), initial encounter: Secondary | ICD-10-CM | POA: Diagnosis present

## 2019-08-10 MED ORDER — TETANUS-DIPHTH-ACELL PERTUSSIS 5-2.5-18.5 LF-MCG/0.5 IM SUSP
0.5000 mL | Freq: Once | INTRAMUSCULAR | Status: AC
  Administered 2019-08-10: 23:00:00 0.5 mL via INTRAMUSCULAR

## 2019-08-10 MED ORDER — LIDOCAINE-EPINEPHRINE-TETRACAINE (LET) TOPICAL GEL
3.0000 mL | Freq: Once | TOPICAL | Status: AC
  Administered 2019-08-10: 23:00:00 3 mL via TOPICAL

## 2019-08-10 NOTE — ED Provider Notes (Signed)
[facility] EMERGENCY DEPARTMENT Provider Note   CSN: [id number] Arrival date & time: 08/10/19  2017     History   Chief Complaint Chief Complaint  Patient presents with  . Laceration    HPI Piers Summerson. is a 63 y.o. male presents to the emergency department evaluation of laceration to the left index finger radial side along the middle phalanx.  Laceration occurred just prior to arrival.  Laceration occurred with a kitchen knife as he was cutting an onion.  His tetanus is not up-to-date.  No numbness or tingling.  Denies any limitation of range of motion.  He is not having any pain.     HPI  Past Medical History:  Diagnosis Date  . Benign esophageal stricture   . Cardiomyopathy, secondary ([place])   . Coronary artery disease   . Diverticulitis   . GI bleed   . Gout   . Hemorrhoids   . Hyperlipidemia   . Hypertension   . Sleep apnea   . Tubular adenoma of colon     Patient Active Problem List   Diagnosis Date Noted  . Pharmacologic therapy 05/04/2019  . Disorder of skeletal system 05/04/2019  . Problems influencing health status 05/04/2019  . Osteoarthritis of hip (Left) 05/26/2018  . Spondylosis without myelopathy or radiculopathy, lumbosacral region 05/25/2018  . Other specified dorsopathies, sacral and sacrococcygeal region 05/25/2018  . Chronic low back pain (Bilateral) (R>L) 05/25/2018  . DDD (degenerative disc disease), lumbosacral 05/13/2018  . Lumbar facet arthropathy (Multilevel) (Bilateral) 05/13/2018  . Grade 1 Anterolisthesis of L4 on L5 05/13/2018  . Chronic pain syndrome 08/16/2016  . Pneumonia 05/28/2016  . Pressure injury of skin 05/28/2016  . Opioid-induced constipation (OIC) 05/17/2016  . Hypokalemia 05/01/2016  . 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 Carotid atherosclerosis 04/04/2016  . Hypoalbuminemia 04/04/2016  . Right sided weakness 04/02/2016  . S/P CABG (coronary artery bypass graft) 03/30/2016  . Chest  pain 02/29/2016  . Unstable angina ([place]) 02/24/2016  . Keloid skin disorder 01/17/2016  . Costochondritis 01/17/2016  . Long term current use of opiate analgesic 12/23/2015  . Long term prescription opiate use 12/23/2015  . Vitamin D insufficiency 11/21/2015  . Lumbar facet syndrome (Bilateral) (R>L) 11/21/2015  . Lumbar spondylosis 11/21/2015  . Elevated sedimentation rate 11/01/2015  . Chronic shoulder pain (Third area of Pain) (Right) 10/25/2015  . Chronic hip pain (Left) 10/25/2015  . History of alcoholism ([place]) 10/25/2015  . Opiate use 10/25/2015  . Encounter for therapeutic drug level monitoring 10/25/2015  . Chronic chest wall pain (Primary Area of Pain) (Incisional Midline) (since 04/22/2012) 10/25/2015  . Chronic lower extremity pain (Secondary area of Pain) (Bilateral) (R>L) 10/25/2015  . History of MI (myocardial infarction) (January 2014) 10/25/2015  . Encounter for pain management planning 10/25/2015  . Chronic sacroiliac joint pain (Bilateral) (L>R) 10/25/2015  . Neuropathic pain 10/25/2015  . Neurogenic pain 10/25/2015  . Incisional pain 10/25/2015  . Hyperlipidemia 03/16/2014  . H/O coronary artery bypass surgery 05/12/2012  . Alcohol withdrawal syndrome ([place]) 04/28/2012  . Injury of kidney 04/25/2012  . OSA (obstructive sleep apnea) 04/22/2012  . Hypertension 04/22/2012  . Coronary artery disease 04/22/2012  . Apnea, sleep 04/21/2012  . Chronic obstructive pulmonary disease ([place]) 04/21/2012  . COPD (chronic obstructive pulmonary disease) ([place]) 04/21/2012    Past Surgical History:  Procedure Laterality Date  . cardiac bypass    . CARDIAC CATHETERIZATION Left 02/03/2016   Procedure: Left Heart Cath and  Coronary Angiography;  Surgeon: Teodoro Spray, MD;  Location: [facility];  Service: Cardiovascular;  Laterality: Left;  . CARDIAC SURGERY    . COLONOSCOPY WITH PROPOFOL N/A 04/29/2015   Procedure: COLONOSCOPY WITH PROPOFOL;  Surgeon: Hulen Luster, MD;   Location: [facility] ENDOSCOPY;  Service: Gastroenterology;  Laterality: N/A;  . CORONARY ANGIOPLASTY WITH STENT PLACEMENT     x2  . CORONARY ARTERY BYPASS GRAFT    . ESOPHAGOGASTRODUODENOSCOPY (EGD) WITH PROPOFOL N/A 04/29/2015   Procedure: ESOPHAGOGASTRODUODENOSCOPY (EGD) WITH PROPOFOL;  Surgeon: Hulen Luster, MD;  Location: [facility] ENDOSCOPY;  Service: Gastroenterology;  Laterality: N/A;  . SEPTOPLASTY          Home Medications    Prior to Admission medications   Medication Sig Start Date End Date Taking? Authorizing Provider  allopurinol (ZYLOPRIM) 100 MG tablet  12/25/18   [provider]  aspirin EC 81 MG tablet Take 81 mg by mouth every morning.    [provider]  atorvastatin (LIPITOR) 40 MG tablet Take 40 mg by mouth at bedtime.     [provider]  B-D UF III MINI PEN NEEDLES 31G X 5 MM MISC U WITH INSULIN PEN INJECTIONS D 12/30/18   [provider]  Cholecalciferol (VITAMIN D3) 2000 units TABS Take 2,000 Units by mouth every morning.    [provider]  gabapentin (NEURONTIN) 600 MG tablet Take 1 tablet (600 mg total) by mouth 2 (two) times daily. 08/09/19 02/05/20  Milinda Pointer, MD  hydrochlorothiazide (HYDRODIURIL) 25 MG tablet Take 25 mg by mouth daily.  07/23/16   [provider]  HYDROcodone-acetaminophen (NORCO/VICODIN) 5-325 MG tablet Take 1 tablet by mouth 2 (two) times daily as needed for severe pain. Must last 30 days 08/09/19 09/08/19  Milinda Pointer, MD  HYDROcodone-acetaminophen (NORCO/VICODIN) 5-325 MG tablet Take 1 tablet by mouth 2 (two) times daily as needed for severe pain. Must last 30 days 09/08/19 10/08/19  Milinda Pointer, MD  HYDROcodone-acetaminophen (NORCO/VICODIN) 5-325 MG tablet Take 1 tablet by mouth 2 (two) times daily as needed for severe pain. Must last 30 days 10/08/19 11/07/19  Milinda Pointer, MD  Insulin Detemir (LEVEMIR FLEXTOUCH) 100 UNIT/ML Pen Inject 46 Units into the skin 2 (two) times daily.      [provider]  isosorbide mononitrate (IMDUR) 30 MG 24 hr tablet Take 30 mg by mouth every morning.     [provider]  linagliptin (TRADJENTA) 5 MG TABS tablet Take 5 mg by mouth daily.    [provider]  losartan (COZAAR) 100 MG tablet take 1 tablet by mouth once daily 08/02/16   [provider]  metoprolol (LOPRESSOR) 50 MG tablet Take 50 mg by mouth 3 (three) times daily.    [provider]  Multiple Vitamin (MULTIVITAMIN WITH MINERALS) TABS tablet Take 1 tablet by mouth every evening.    [provider]  nitroGLYCERIN (NITROSTAT) 0.4 MG SL tablet Place 0.4 mg under the tongue every 5 (five) minutes x 3 doses as needed for chest pain.  03/07/16   [provider]  OZEMPIC, 0.25 OR 0.5 MG/DOSE, 2 MG/1.5ML SOPN Inject 0.5 mg as directed once a week. 09/23/18   [provider]  Potassium 95 MG TABS Take 99 mg by mouth every morning.     [provider]    Family History Family History  Problem Relation Age of Onset  . Heart disease Mother   . Hypertension Father     Social History  Social History   Tobacco Use  . Smoking status: Former Smoker    Years: 25.00  . Smokeless tobacco: Former Network engineer Use Topics  . Alcohol use: Yes    Comment: occasional  . Drug use: No     Allergies   Patient has no known allergies.   Review of Systems Review of Systems  Constitutional: Negative for fever.  Musculoskeletal: Negative for arthralgias, gait problem, joint swelling and myalgias.  Skin: Positive for wound.     Physical Exam Updated Vital Signs BP (!) 167/78 (BP Location: Right Arm)   Pulse 83   Temp 98.2 F (36.8 C) (Oral)   Resp 17   Ht 5\' 3"  (1.6 m)   Wt 77.6 kg   SpO2 98%   BMI 30.29 kg/m   Physical Exam Constitutional:      Appearance: He is well-developed.  HENT:     Head: Normocephalic and atraumatic.  Eyes:     Conjunctiva/sclera: Conjunctivae normal.  Neck:      Musculoskeletal: Normal range of motion.  Cardiovascular:     Rate and Rhythm: Normal rate.  Pulmonary:     Effort: Pulmonary effort is normal. No respiratory distress.  Musculoskeletal: Normal range of motion.     Comments: 1 cm laceration to the radial aspect of the left index finger middle phalanx.  No visible or palpable foreign body.  No tendon deficits noted.  Wound thoroughly irrigated and repaired with sutures.  Skin:    General: Skin is warm.     Findings: No rash.  Neurological:     Mental Status: He is alert and oriented to person, place, and time.  Psychiatric:        Behavior: Behavior normal.        Thought Content: Thought content normal.      ED Treatments / Results  Labs (all labs ordered are listed, but only abnormal results are displayed) Labs Reviewed - No data to display  EKG None  Radiology No results found.  Procedures .Marland KitchenLaceration Repair  Date/Time: 08/10/2019 11:09 PM Performed by: Duanne Guess, PA-C Authorized by: Duanne Guess, PA-C   Consent:    Consent obtained:  Verbal   Consent given by:  Patient Anesthesia (see MAR for exact dosages):    Anesthesia method:  Topical application   Topical anesthetic:  LET Laceration details:    Location:  Finger   Finger location:  L index finger   Length (cm):  1   Depth (mm):  2 Repair type:    Repair type:  Simple Exploration:    Contaminated: no   Treatment:    Area cleansed with:  Betadine and saline Skin repair:    Repair method:  Sutures   Suture size:  5-0   Suture technique:  Simple interrupted   Number of sutures:  2 Approximation:    Approximation:  Close Post-procedure details:    Dressing:  Adhesive bandage   Patient tolerance of procedure:  Tolerated well, no immediate complications   (including critical care time)  Medications Ordered in ED Medications  lidocaine-EPINEPHrine-tetracaine (LET) topical gel (3 mLs Topical Given 08/10/19 2230)  Tdap (BOOSTRIX) injection  0.5 mL (0.5 mLs Intramuscular Given 08/10/19 2232)     Initial Impression / Assessment and Plan / ED Course  I have reviewed the triage vital signs and the nursing notes.  Pertinent labs & imaging results that were available during my care of the patient were reviewed by me and considered in  my medical decision making (see chart for details).        63 year old male with left index finger laceration.  Tetanus updated.  Laceration repaired after thorough irrigation.  He is neurovascular intact no tendon deficits noted.  He is educated on wound care and follow-up for suture removal.  Final Clinical Impressions(s) / ED Diagnoses   Final diagnoses:  Laceration of left index finger without foreign body without damage to nail, initial encounter    ED Discharge Orders    None       Renata Caprice 08/10/19 2311    Merlyn Lot, MD 08/11/19 1827

## 2019-08-10 NOTE — Discharge Instructions (Signed)
Please keep laceration site clean.  You may shower and get wet but do not submerge underwater.  Keep laceration covered throughout the day.  You may cleanse with hydrogen peroxide or alcohol daily.  Return to the ER for any increasing pain swelling warmth or redness.  Follow-up with primary care provider, walk-in clinic in 8 to 10 days for suture removal.

## 2019-08-10 NOTE — ED Triage Notes (Signed)
Pt cutting onion tonight when he cut the left index finger. Bleeding controled, new bandage applied.

## 2019-08-20 DIAGNOSIS — S61219D Laceration without foreign body of unspecified finger without damage to nail, subsequent encounter: Secondary | ICD-10-CM | POA: Insufficient documentation

## 2019-11-03 ENCOUNTER — Telehealth: Payer: Self-pay

## 2019-11-03 ENCOUNTER — Encounter: Payer: Self-pay | Admitting: Pain Medicine

## 2019-11-03 MED ORDER — HYDROCODONE-ACETAMINOPHEN 5-325 MG PO TABS: 1.0000 | ORAL_TABLET | Freq: Two times a day (BID) | ORAL | 0 refills | Status: DC | PRN

## 2019-11-03 NOTE — Telephone Encounter (Signed)
LM for patient to call office

## 2019-11-03 NOTE — Progress Notes (Signed)
Patient: [name] [name].  Service Category: E/M  Provider: Gaspar Cola, MD  DOB: 1955-12-31  DOS: 11/04/2019  Location: Office  MRN: [id number]  Setting: Ambulatory outpatient  Referring Provider: Ranae Plumber, PA  Type: Established Patient  Specialty: Interventional Pain Management  PCP: Ranae Plumber, PA  Location: Remote location  Delivery: TeleHealth     Virtual Encounter - Pain Management PROVIDER NOTE: Information contained herein reflects review and annotations entered in association with encounter. Interpretation of such information and data should be left to medically-trained personnel. Information provided to patient can be located elsewhere in the medical record under "Patient Instructions". Document created using STT-dictation technology, any transcriptional errors that may result from process are unintentional.    Contact & Pharmacy Preferred: [phone] Home: [phone] (home) Mobile: [phone] (mobile) E-mail: [email or web address] [place], [place] AT [facility] [address] [place] [address] Phone: [phone] Fax: [phone]   Pre-screening  Mr. Tursi offered "in-person" vs "virtual" encounter. He indicated preferring virtual for this encounter.   Reason COVID-19*  Social distancing based on CDC and AMA recommendations.   I contacted Margaretha Seeds. on 11/04/2019 via telephone.      I clearly identified myself as Gaspar Cola, MD. I verified that I was speaking with the correct person using two identifiers (Name: Khayman Kirsch., and date of birth: 31-Aug-1956).  Consent I sought verbal advanced consent from Margaretha Seeds. for virtual visit interactions. I informed Mr. Grant of possible security and privacy concerns, risks, and limitations associated with providing "not-in-person" medical evaluation and management services. I also informed Mr. Hayashi of the  availability of "in-person" appointments. Finally, I informed him that there would be a charge for the virtual visit and that he could be  personally, fully or partially, financially responsible for it. Mr. Holberg expressed understanding and agreed to proceed.   Historic Elements   Mr. Kiyoshi Schaab. is a 64 y.o. year old, male patient evaluated today after his last contact with our practice on Visit date not found. Mr. Valeriano  has a past medical history of Benign esophageal stricture, Cardiomyopathy, secondary ([place]), Coronary artery disease, Diverticulitis, GI bleed, Gout, Hemorrhoids, Hyperlipidemia, Hypertension, Sleep apnea, and Tubular adenoma of colon. He also  has a past surgical history that includes Coronary angioplasty with stent; cardiac bypass; Cardiac surgery; Coronary artery bypass graft; Colonoscopy with propofol (N/A, 04/29/2015); Esophagogastroduodenoscopy (egd) with propofol (N/A, 04/29/2015); Septoplasty; and Cardiac catheterization (Left, 02/03/2016). Mr. Rivero has a current medication list which includes the following prescription(s): allopurinol, aspirin ec, atorvastatin, b-d uf iii mini pen needles, vitamin d3, gabapentin, hydrochlorothiazide, insulin detemir, isosorbide mononitrate, linagliptin, losartan, metoprolol tartrate, multivitamin with minerals, nitroglycerin, ozempic (0.25 or 0.5 mg/dose), potassium, [START ON 11/07/2019] hydrocodone-acetaminophen, [START ON 12/07/2019] hydrocodone-acetaminophen, and [START ON 01/06/2020] hydrocodone-acetaminophen. He  reports that he has quit smoking. He quit after 25.00 years of use. He has quit using smokeless tobacco. He reports current alcohol use. He reports that he does not use drugs. Mr. Eichorn has No Known Allergies.   HPI  Today, he is being contacted for medication management.  Pharmacotherapy Assessment  Analgesic: Hydrocodone/APAP 5/325 mg, 1 tab PO BID (10 mg/day of hydrocodone) MME/day: 10 mg/day.   Monitoring: [place] PMP: PDMP  reviewed during this encounter.       Pharmacotherapy: No side-effects or adverse reactions reported. Compliance: No problems identified. Effectiveness: Clinically  acceptable. Plan: Refer to "POC".  UDS:  Summary  Date Value Ref Range Status  05/06/2018 FINAL  Final    Comment:    ==================================================================== TOXASSURE SELECT 13 (MW) ==================================================================== Test                             Result       Flag       Units Drug Present and Declared for Prescription Verification   Hydrocodone                    924          EXPECTED   ng/mg creat   Hydromorphone                  159          EXPECTED   ng/mg creat   Dihydrocodeine                 38           EXPECTED   ng/mg creat   Norhydrocodone                 636          EXPECTED   ng/mg creat    Sources of hydrocodone include scheduled prescription    medications. Hydromorphone, dihydrocodeine and norhydrocodone are    expected metabolites of hydrocodone. Hydromorphone and    dihydrocodeine are also available as scheduled prescription    medications. ==================================================================== Test                      Result    Flag   Units      Ref Range   Creatinine              245              mg/dL      >=20 ==================================================================== Declared Medications:  The flagging and interpretation on this report are based on the  following declared medications.  Unexpected results may arise from  inaccuracies in the declared medications.  **Note: The testing scope of this panel includes these medications:  Hydrocodone (Norco)  **Note: The testing scope of this panel does not include following  reported medications:  Acetaminophen (Norco)  Allopurinol (Zyloprim)  Amlodipine (Norvasc)  Aspirin  Atorvastatin (Lipitor)  Gabapentin (Neurontin)  Hydrochlorothiazide (Hydrodiuril)   Insulin (Levemir)  Isosorbide (Imdur)  Linagliptin (Tradjenta)  Losartan (Cozaar)  Metoprolol (Lopressor)  Multivitamin  Nitroglycerin (Nitrostat)  Potassium  Vitamin D3 ==================================================================== For clinical consultation, please call [phone]. ====================================================================    Laboratory Chemistry Profile   Renal Lab Results  Component Value Date   BUN 21 05/07/2019   CREATININE 1.39 (H) 05/07/2019   GFRAA >60 05/07/2019   GFRNONAA 53 (L) 05/07/2019    Hepatic Lab Results  Component Value Date   AST 46 (H) 05/07/2019   ALT 57 (H) 05/07/2019   ALBUMIN 4.3 05/07/2019   ALKPHOS 77 05/07/2019   LIPASE 29 03/19/2015    Electrolytes Lab Results  Component Value Date   NA 137 05/07/2019   K 4.0 05/07/2019   CL 99 05/07/2019   CALCIUM 10.2 05/07/2019   MG 1.9 05/07/2019    Bone Lab Results  Component Value Date   VD25OH 74.7 05/07/2019   VD125OH2TOT 30.8 10/26/2015    Inflammation (CRP: Acute Phase) (ESR: Chronic Phase) Lab Results  Component Value  Date   CRP 0.9 05/07/2019   ESRSEDRATE 41 (H) 05/07/2019   LATICACIDVEN 1.4 05/28/2016      Note: Above Lab results reviewed.  Imaging  DG Shoulder Left CLINICAL DATA:  Left shoulder pain over the last few days which increases with movement. Tender to palpation. Initial encounter.  EXAM: LEFT SHOULDER - 2+ VIEW  COMPARISON:  None.  FINDINGS: The left shoulder is located. No acute abnormality is present. Calcification anterior to the proximal humerus on the axillary view may be within the tendon. Visualized hemithorax is clear.  IMPRESSION: 1. No acute abnormality. 2. Question calcific tendinitis along the deltoid.  Electronically Signed   By: San Morelle M.D.   On: 02/12/2019 07:48  Assessment  The primary encounter diagnosis was Chronic pain syndrome. Diagnoses of Chronic chest wall pain (Primary Area of  Pain) (Incisional Midline) (since 04/22/2012) and Chronic lower extremity pain (Secondary area of Pain) (Bilateral) (R>L) were also pertinent to this visit.  Plan of Care  Problem-specific:  No problem-specific Assessment & Plan notes found for this encounter.  Mr. Matai Carpenito. has a current medication list which includes the following long-term medication(s): allopurinol, atorvastatin, gabapentin, insulin detemir, isosorbide mononitrate, linagliptin, metoprolol tartrate, potassium, [START ON 11/07/2019] hydrocodone-acetaminophen, [START ON 12/07/2019] hydrocodone-acetaminophen, and [START ON 01/06/2020] hydrocodone-acetaminophen.  Pharmacotherapy (Medications Ordered): Meds ordered this encounter  Medications  . HYDROcodone-acetaminophen (NORCO/VICODIN) 5-325 MG tablet    Sig: Take 1 tablet by mouth 2 (two) times daily as needed for severe pain. Must last 30 days    Dispense:  60 tablet    Refill:  0    Chronic Pain: STOP Act (Not applicable) Fill 1 day early if closed on refill date. Do not fill until: 11/07/2019. To last until: 12/07/2019. Avoid benzodiazepines within 8 hours of opioids  . HYDROcodone-acetaminophen (NORCO/VICODIN) 5-325 MG tablet    Sig: Take 1 tablet by mouth 2 (two) times daily as needed for severe pain. Must last 30 days    Dispense:  60 tablet    Refill:  0    Chronic Pain: STOP Act (Not applicable) Fill 1 day early if closed on refill date. Do not fill until: 12/07/2019. To last until: 01/06/2020. Avoid benzodiazepines within 8 hours of opioids  . HYDROcodone-acetaminophen (NORCO/VICODIN) 5-325 MG tablet    Sig: Take 1 tablet by mouth 2 (two) times daily as needed for severe pain. Must last 30 days    Dispense:  60 tablet    Refill:  0    Chronic Pain: STOP Act (Not applicable) Fill 1 day early if closed on refill date. Do not fill until: 01/06/2020. To last until: 02/05/2020. Avoid benzodiazepines within 8 hours of opioids   Orders:  No orders of the defined types were  placed in this encounter.  Follow-up plan:   Return in about 13 weeks (around 02/03/2020) for (VV), (MM).      Interventional management options: Planned, scheduled, and/or pending:   None at this time.   Considering:   Therapeutic bilateral Lumbar facet + bilateral sacroiliac joint RFA #1    Palliative PRN treatment(s):   Palliative left intra-articular hip joint injection #2  Palliative bilateral lumbar facet block #3  Palliative bilateral sacroiliac joint block #2       Recent Visits No visits were found meeting these conditions.  Showing recent visits within past 90 days and meeting all other requirements   Today's Visits Date Type Provider Dept  11/04/19 Telemedicine Milinda Pointer, [facility]  Showing today's visits and meeting all other requirements   Future Appointments No visits were found meeting these conditions.  Showing future appointments within next 90 days and meeting all other requirements   I discussed the assessment and treatment plan with the patient. The patient was provided an opportunity to ask questions and all were answered. The patient agreed with the plan and demonstrated an understanding of the instructions.  Patient advised to call back or seek an in-person evaluation if the symptoms or condition worsens.  Duration of encounter: 12 minutes.  Note by: Gaspar Cola, MD Date: 11/04/2019; Time: 8:15 AM

## 2019-11-04 ENCOUNTER — Other Ambulatory Visit: Payer: Self-pay

## 2019-11-04 ENCOUNTER — Telehealth: Payer: Self-pay | Admitting: *Deleted

## 2019-11-04 ENCOUNTER — Ambulatory Visit: Payer: Medicare Other | Attending: Pain Medicine | Admitting: Pain Medicine

## 2019-11-04 DIAGNOSIS — G894 Chronic pain syndrome: Secondary | ICD-10-CM | POA: Diagnosis not present

## 2019-11-04 DIAGNOSIS — M79604 Pain in right leg: Secondary | ICD-10-CM | POA: Diagnosis not present

## 2019-11-04 DIAGNOSIS — G8929 Other chronic pain: Secondary | ICD-10-CM | POA: Diagnosis not present

## 2019-11-04 DIAGNOSIS — M79605 Pain in left leg: Secondary | ICD-10-CM

## 2019-11-04 DIAGNOSIS — R0789 Other chest pain: Secondary | ICD-10-CM | POA: Diagnosis not present

## 2020-02-03 ENCOUNTER — Ambulatory Visit: Payer: Medicare Other | Attending: Pain Medicine | Admitting: Pain Medicine

## 2020-02-03 ENCOUNTER — Other Ambulatory Visit: Payer: Self-pay

## 2020-02-03 ENCOUNTER — Encounter: Payer: Self-pay | Admitting: Pain Medicine

## 2020-02-03 VITALS — BP 164/81 | HR 75 | Temp 97.2°F | Resp 18 | Ht 63.0 in | Wt 170.0 lb

## 2020-02-03 DIAGNOSIS — Z79899 Other long term (current) drug therapy: Secondary | ICD-10-CM | POA: Diagnosis present

## 2020-02-03 DIAGNOSIS — G8929 Other chronic pain: Secondary | ICD-10-CM

## 2020-02-03 DIAGNOSIS — G894 Chronic pain syndrome: Secondary | ICD-10-CM

## 2020-02-03 DIAGNOSIS — M79605 Pain in left leg: Secondary | ICD-10-CM | POA: Insufficient documentation

## 2020-02-03 DIAGNOSIS — M25511 Pain in right shoulder: Secondary | ICD-10-CM | POA: Insufficient documentation

## 2020-02-03 DIAGNOSIS — M792 Neuralgia and neuritis, unspecified: Secondary | ICD-10-CM | POA: Diagnosis present

## 2020-02-03 DIAGNOSIS — R0789 Other chest pain: Secondary | ICD-10-CM | POA: Insufficient documentation

## 2020-02-03 DIAGNOSIS — M79604 Pain in right leg: Secondary | ICD-10-CM | POA: Diagnosis present

## 2020-02-03 MED ORDER — HYDROCODONE-ACETAMINOPHEN 5-325 MG PO TABS: 1.0000 | ORAL_TABLET | Freq: Two times a day (BID) | ORAL | 0 refills | Status: DC | PRN

## 2020-02-03 MED ORDER — GABAPENTIN 600 MG PO TABS: 600.0000 mg | ORAL_TABLET | Freq: Two times a day (BID) | ORAL | 5 refills | Status: DC

## 2020-02-03 NOTE — Progress Notes (Signed)
PROVIDER NOTE: Information contained herein reflects review and annotations entered in association with encounter. Interpretation of such information and data should be left to medically-trained personnel. Information provided to patient can be located elsewhere in the medical record under "Patient Instructions". Document created using STT-dictation technology, any transcriptional errors that may result from process are unintentional.    Patient: [name] [name].  Service Category: E/M  Provider: Gaspar Cola, MD  DOB: 26-Feb-1956  DOS: 02/03/2020  Referring Provider: Ranae Plumber, [place]  MRN: [id number]  Setting: Ambulatory outpatient  PCP: Ranae Plumber, PA  Type: Established Patient  Specialty: Interventional Pain Management    Location: Office  Delivery: Face-to-face     HPI  Reason for encounter: Mr. Abdiaziz Klahn., a 64 y.o. year old male, is here today for evaluation and management of his Chronic pain syndrome [G89.4]. Mr. Romanoski primary complain today is Pain (legs, buttocks, chest.) Last encounter: Practice (11/04/2019). My last encounter with him was on Visit date not found. Pertinent problems: Mr. Allman has Chronic shoulder pain (Third area of Pain) (Right); Chronic hip pain (Left); Chronic chest wall pain (Primary Area of Pain) (Incisional Midline) (since 04/22/2012); Chronic lower extremity pain (Secondary area of Pain) (Bilateral) (R>L); Chronic sacroiliac joint pain (Bilateral) (L>R); Neuropathic pain; Neurogenic pain; Incisional pain; Lumbar facet syndrome (Bilateral) (R>L); Lumbar spondylosis; Keloid skin disorder; Costochondritis; Brachial plexus injury, right; Right sided weakness; Chronic pain syndrome; DDD (degenerative disc disease), lumbosacral; Lumbar facet arthropathy (Multilevel) (Bilateral); Grade 1 Anterolisthesis of L4 on L5; Spondylosis without myelopathy or radiculopathy, lumbosacral region; Other specified dorsopathies, sacral and sacrococcygeal region; Chronic  low back pain (Bilateral) (R>L); and Osteoarthritis of hip (Left) on their pertinent problem list. Pain Assessment: Severity of Chronic pain is reported as a 3 /10. Location: Buttocks Mid/pain all the time in the right leg.. Onset: More than a month ago. Quality: Constant, Dull, Aching. Timing: Constant. Modifying factor(s): meds.. Vitals:  height is 5' 3" (1.6 m) and weight is 170 lb (77.1 kg). His temporal temperature is 97.2 F (36.2 C) (abnormal). His blood pressure is 164/81 (abnormal) and his pulse is 75. His respiration is 18 and oxygen saturation is 97%.    The patient indicates doing well with the current medication regimen. No adverse reactions or side effects reported to the medications.   Pharmacotherapy Assessment  Analgesic: Hydrocodone/APAP 5/325 mg, 1 tab PO BID (10 mg/day of hydrocodone) MME/day: 10 mg/day.   Monitoring: [place] PMP: PDMP reviewed during this encounter.       Pharmacotherapy: No side-effects or adverse reactions reported. Compliance: No problems identified. Effectiveness: Clinically acceptable.  UDS:  Summary  Date Value Ref Range Status  05/06/2018 FINAL  Final    Comment:    ==================================================================== TOXASSURE SELECT 13 (MW) ==================================================================== Test                             Result       Flag       Units Drug Present and Declared for Prescription Verification   Hydrocodone                    924          EXPECTED   ng/mg creat   Hydromorphone                  159          EXPECTED   ng/mg creat  Dihydrocodeine                 38           EXPECTED   ng/mg creat   Norhydrocodone                 636          EXPECTED   ng/mg creat    Sources of hydrocodone include scheduled prescription    medications. Hydromorphone, dihydrocodeine and norhydrocodone are    expected metabolites of hydrocodone. Hydromorphone and    dihydrocodeine are also available as scheduled  prescription    medications. ==================================================================== Test                      Result    Flag   Units      Ref Range   Creatinine              245              mg/dL      >=20 ==================================================================== Declared Medications:  The flagging and interpretation on this report are based on the  following declared medications.  Unexpected results may arise from  inaccuracies in the declared medications.  **Note: The testing scope of this panel includes these medications:  Hydrocodone (Norco)  **Note: The testing scope of this panel does not include following  reported medications:  Acetaminophen (Norco)  Allopurinol (Zyloprim)  Amlodipine (Norvasc)  Aspirin  Atorvastatin (Lipitor)  Gabapentin (Neurontin)  Hydrochlorothiazide (Hydrodiuril)  Insulin (Levemir)  Isosorbide (Imdur)  Linagliptin (Tradjenta)  Losartan (Cozaar)  Metoprolol (Lopressor)  Multivitamin  Nitroglycerin (Nitrostat)  Potassium  Vitamin D3 ==================================================================== For clinical consultation, please call [phone]. ====================================================================    ROS  Constitutional: Denies any fever or chills Gastrointestinal: No reported hemesis, hematochezia, vomiting, or acute GI distress Musculoskeletal: Denies any acute onset joint swelling, redness, loss of ROM, or weakness Neurological: No reported episodes of acute onset apraxia, aphasia, dysarthria, agnosia, amnesia, paralysis, loss of coordination, or loss of consciousness  Medication Review  HYDROcodone-acetaminophen, Insulin Pen Needle, Potassium, Semaglutide(0.25 or 0.5MG/DOS), Vitamin D3, allopurinol, aspirin EC, atorvastatin, gabapentin, hydrochlorothiazide, insulin detemir, isosorbide mononitrate, linagliptin, losartan, metoprolol tartrate, multivitamin with minerals, and  nitroGLYCERIN  History Review  Allergy: Mr. Sedore has No Known Allergies. Drug: Mr. Brookover  reports no history of drug use. Alcohol:  reports current alcohol use. Tobacco:  reports that he has quit smoking. He quit after 25.00 years of use. He has quit using smokeless tobacco. Social: Mr. Dettman  reports that he has quit smoking. He quit after 25.00 years of use. He has quit using smokeless tobacco. He reports current alcohol use. He reports that he does not use drugs. Medical:  has a past medical history of Benign esophageal stricture, Cardiomyopathy, secondary ([place]), Coronary artery disease, Diverticulitis, GI bleed, Gout, Hemorrhoids, Hyperlipidemia, Hypertension, Sleep apnea, and Tubular adenoma of colon. Surgical: Mr. Bard  has a past surgical history that includes Coronary angioplasty with stent; cardiac bypass; Cardiac surgery; Coronary artery bypass graft; Colonoscopy with propofol (N/A, 04/29/2015); Esophagogastroduodenoscopy (egd) with propofol (N/A, 04/29/2015); Septoplasty; and Cardiac catheterization (Left, 02/03/2016). Family: family history includes Heart disease in his mother; Hypertension in his father.  Laboratory Chemistry Profile   Renal Lab Results  Component Value Date   BUN 21 05/07/2019   CREATININE 1.39 (H) 05/07/2019   GFRAA >60 05/07/2019   GFRNONAA 53 (L) 05/07/2019     Hepatic Lab Results  Component Value Date  AST 46 (H) 05/07/2019   ALT 57 (H) 05/07/2019   ALBUMIN 4.3 05/07/2019   ALKPHOS 77 05/07/2019   LIPASE 29 03/19/2015     Electrolytes Lab Results  Component Value Date   NA 137 05/07/2019   K 4.0 05/07/2019   CL 99 05/07/2019   CALCIUM 10.2 05/07/2019   MG 1.9 05/07/2019     Bone Lab Results  Component Value Date   VD25OH 74.7 05/07/2019   VD125OH2TOT 30.8 10/26/2015     Inflammation (CRP: Acute Phase) (ESR: Chronic Phase) Lab Results  Component Value Date   CRP 0.9 05/07/2019   ESRSEDRATE 41 (H) 05/07/2019   LATICACIDVEN  1.4 05/28/2016       Note: Above Lab results reviewed.  Recent Imaging Review  DG Shoulder Left CLINICAL DATA:  Left shoulder pain over the last few days which increases with movement. Tender to palpation. Initial encounter.  EXAM: LEFT SHOULDER - 2+ VIEW  COMPARISON:  None.  FINDINGS: The left shoulder is located. No acute abnormality is present. Calcification anterior to the proximal humerus on the axillary view may be within the tendon. Visualized hemithorax is clear.  IMPRESSION: 1. No acute abnormality. 2. Question calcific tendinitis along the deltoid.  Electronically Signed   By: San Morelle M.D.   On: 02/12/2019 07:48 Note: Reviewed        Physical Exam  General appearance: Well nourished, well developed, and well hydrated. In no apparent acute distress Mental status: Alert, oriented x 3 (person, place, & time)       Respiratory: No evidence of acute respiratory distress Eyes: PERLA Vitals: BP (!) 164/81   Pulse 75   Temp (!) 97.2 F (36.2 C) (Temporal)   Resp 18   Ht 5' 3" (1.6 m)   Wt 170 lb (77.1 kg)   SpO2 97%   BMI 30.11 kg/m  BMI: Estimated body mass index is 30.11 kg/m as calculated from the following:   Height as of this encounter: 5' 3" (1.6 m).   Weight as of this encounter: 170 lb (77.1 kg). Ideal: Ideal body weight: 56.9 kg (125 lb 7.1 oz) Adjusted ideal body weight: 65 kg (143 lb 4.2 oz)  Assessment   Status Diagnosis  Controlled Controlled Controlled 1. Chronic pain syndrome   2. Chronic chest wall pain (Primary Area of Pain) (Incisional Midline) (since 04/22/2012)   3. Chronic lower extremity pain (Secondary area of Pain) (Bilateral) (R>L)   4. Chronic shoulder pain (Third area of Pain) (Right)   5. Neurogenic pain   6. Pharmacologic therapy      Updated Problems: Problem  Chronic Pain Syndrome  Coronary Artery Disease   Overview:  Coronary artery bypass grafting on 04/22/12 with a free LIMA to the LAD, saphenous vein  graft to PDA, saphenous vein graft to OM2, saphenous vein graft to ramus intermedius.  Overview:  Coronary artery bypass grafting on 04/22/12 with a free LIMA to the LAD, saphenous vein graft to PDA, saphenous vein graft to OM2, saphenous vein graft to ramus intermedius.  Formatting of this note might be different from the original. Coronary artery bypass grafting on 04/22/12 with a free LIMA to the LAD, saphenous vein graft to PDA, saphenous vein graft to OM2, saphenous vein graft to ramus intermedius.     PLAN OF CARE  Chronic Opioid Analgesic:  Hydrocodone/APAP 5/325 mg, 1 tab PO BID (10 mg/day of hydrocodone) MME/day: 10 mg/day.  Problem-specific:  No problem-specific Assessment & Plan notes found for this encounter.  Mr. Kebron Pulse. has a current medication list which includes the following long-term medication(s): allopurinol, atorvastatin, [START ON 02/05/2020] gabapentin, [START ON 02/05/2020] hydrocodone-acetaminophen, [START ON 03/06/2020] hydrocodone-acetaminophen, [START ON 04/05/2020] hydrocodone-acetaminophen, insulin detemir, isosorbide mononitrate, linagliptin, metoprolol tartrate, and potassium. Orders:  Orders Placed This Encounter  Procedures  . ToxASSURE Select 13 (MW), Urine    Volume: 30 ml(s). Minimum 3 ml of urine is needed. Document temperature of fresh sample. Indications: Long term (current) use of opiate analgesic (Z79.891)    Order Specific Question:   Release to patient    Answer:   Immediate    Lab Orders     ToxASSURE Select 13 (MW), Urine Imaging Orders  No imaging studies ordered today   Referral Orders  No referral(s) requested today   Pharmacotherapy (Medications Ordered): Meds ordered this encounter  Medications  . HYDROcodone-acetaminophen (NORCO/VICODIN) 5-325 MG tablet    Sig: Take 1 tablet by mouth 2 (two) times daily as needed for severe pain. Must last 30 days    Dispense:  60 tablet    Refill:  0    Chronic Pain: STOP Act (Not  applicable) Fill 1 day early if closed on refill date. Do not fill until: 02/05/2020. To last until: 03/06/2020. Avoid benzodiazepines within 8 hours of opioids  . gabapentin (NEURONTIN) 600 MG tablet    Sig: Take 1 tablet (600 mg total) by mouth 2 (two) times daily.    Dispense:  60 tablet    Refill:  5    Fill one day early if pharmacy is closed on scheduled refill date. May substitute for generic if available.  Marland Kitchen HYDROcodone-acetaminophen (NORCO/VICODIN) 5-325 MG tablet    Sig: Take 1 tablet by mouth 2 (two) times daily as needed for severe pain. Must last 30 days    Dispense:  60 tablet    Refill:  0    Chronic Pain: STOP Act (Not applicable) Fill 1 day early if closed on refill date. Do not fill until: 03/06/2020. To last until: 04/05/2020. Avoid benzodiazepines within 8 hours of opioids  . HYDROcodone-acetaminophen (NORCO/VICODIN) 5-325 MG tablet    Sig: Take 1 tablet by mouth 2 (two) times daily as needed for severe pain. Must last 30 days    Dispense:  60 tablet    Refill:  0    Chronic Pain: STOP Act (Not applicable) Fill 1 day early if closed on refill date. Do not fill until: 04/05/2020. To last until: 05/05/2020. Avoid benzodiazepines within 8 hours of opioids   Medications administered today: [name] [name]. "Tim" had no medications administered during this visit. Problem-specific:  No problem-specific Assessment & Plan notes found for this encounter.  Mr. Ellard Nan. has a current medication list which includes the following long-term medication(s): allopurinol, atorvastatin, [START ON 02/05/2020] gabapentin, [START ON 02/05/2020] hydrocodone-acetaminophen, [START ON 03/06/2020] hydrocodone-acetaminophen, [START ON 04/05/2020] hydrocodone-acetaminophen, insulin detemir, isosorbide mononitrate, linagliptin, metoprolol tartrate, and potassium. Medications administered: [name] [name]. "Tim" had no medications administered during this visit.   Follow-up plan:   Return in about 13  weeks (around 05/04/2020) for (F2F), (MM).      Interventional management options: Planned, scheduled, and/or pending:   None at this time.   Considering:   Therapeutic bilateral Lumbar facet + bilateral sacroiliac joint RFA #1    Palliative PRN treatment(s):   Palliative left intra-articular hip joint injection #2  Palliative bilateral lumbar facet block #3  Palliative bilateral sacroiliac joint block #2  Recent Visits No visits were found meeting these conditions.  Showing recent visits within past 90 days and meeting all other requirements   Today's Visits Date Type Provider Dept  02/03/20 Office Visit Milinda Pointer, MD [facility]  Showing today's visits and meeting all other requirements   Future Appointments Date Type Provider Dept  05/02/20 Appointment Milinda Pointer, MD [facility]  Showing future appointments within next 90 days and meeting all other requirements    I discussed the assessment and treatment plan with the patient. The patient was provided an opportunity to ask questions and all were answered. The patient agreed with the plan and demonstrated an understanding of the instructions.  Patient advised to call back or seek an in-person evaluation if the symptoms or condition worsens.  Duration of encounter: 30 minutes.  Note by: Gaspar Cola, MD Date: 02/03/2020; Time: 8:44 PM

## 2020-02-03 NOTE — Progress Notes (Signed)
Nursing Pain Medication Assessment:  Safety precautions to be maintained throughout the outpatient stay will include: orient to surroundings, keep bed in low position, maintain call bell within reach at all times, provide assistance with transfer out of bed and ambulation.  Medication Inspection Compliance: Pill count conducted under aseptic conditions, in front of the patient. Neither the pills nor the bottle was removed from the patient's sight at any time. Once count was completed pills were immediately returned to the patient in their original bottle.  Medication: Hydrocodone/APAP Pill/Patch Count: 6 of 60 pills remain Pill/Patch Appearance: Markings consistent with prescribed medication Bottle Appearance: Standard pharmacy container. Clearly labeled. Filled Date: 05 / 05 / 2021 Last Medication intake:  Yesterday

## 2020-05-02 ENCOUNTER — Ambulatory Visit: Payer: Medicare Other | Admitting: Pain Medicine

## 2020-05-02 DIAGNOSIS — Z9181 History of falling: Secondary | ICD-10-CM | POA: Insufficient documentation

## 2020-05-04 ENCOUNTER — Encounter: Payer: Self-pay | Admitting: Student in an Organized Health Care Education/Training Program

## 2020-05-04 ENCOUNTER — Other Ambulatory Visit: Payer: Self-pay

## 2020-05-04 ENCOUNTER — Ambulatory Visit
Payer: Medicare Other | Attending: Pain Medicine | Admitting: Student in an Organized Health Care Education/Training Program

## 2020-05-04 VITALS — BP 142/97 | HR 81 | Temp 97.2°F | Resp 16 | Ht 63.0 in | Wt 165.0 lb

## 2020-05-04 DIAGNOSIS — M47817 Spondylosis without myelopathy or radiculopathy, lumbosacral region: Secondary | ICD-10-CM

## 2020-05-04 DIAGNOSIS — M79605 Pain in left leg: Secondary | ICD-10-CM | POA: Diagnosis present

## 2020-05-04 DIAGNOSIS — M792 Neuralgia and neuritis, unspecified: Secondary | ICD-10-CM | POA: Diagnosis present

## 2020-05-04 DIAGNOSIS — R0789 Other chest pain: Secondary | ICD-10-CM | POA: Insufficient documentation

## 2020-05-04 DIAGNOSIS — M79604 Pain in right leg: Secondary | ICD-10-CM | POA: Insufficient documentation

## 2020-05-04 DIAGNOSIS — M25511 Pain in right shoulder: Secondary | ICD-10-CM | POA: Diagnosis present

## 2020-05-04 DIAGNOSIS — M47816 Spondylosis without myelopathy or radiculopathy, lumbar region: Secondary | ICD-10-CM | POA: Diagnosis present

## 2020-05-04 DIAGNOSIS — G894 Chronic pain syndrome: Secondary | ICD-10-CM | POA: Insufficient documentation

## 2020-05-04 DIAGNOSIS — G8929 Other chronic pain: Secondary | ICD-10-CM | POA: Insufficient documentation

## 2020-05-04 MED ORDER — HYDROCODONE-ACETAMINOPHEN 5-325 MG PO TABS: 1.0000 | ORAL_TABLET | Freq: Two times a day (BID) | ORAL | 0 refills | Status: DC | PRN

## 2020-05-04 NOTE — Progress Notes (Signed)
Nursing Pain Medication Assessment:  Safety precautions to be maintained throughout the outpatient stay will include: orient to surroundings, keep bed in low position, maintain call bell within reach at all times, provide assistance with transfer out of bed and ambulation.  Medication Inspection Compliance: Pill count conducted under aseptic conditions, in front of the patient. Neither the pills nor the bottle was removed from the patient's sight at any time. Once count was completed pills were immediately returned to the patient in their original bottle.  Medication: Hydrocodone/APAP Pill/Patch Count: 3 of 60 pills remain Pill/Patch Appearance: Markings consistent with prescribed medication Bottle Appearance: Standard pharmacy container. Clearly labeled. Filled Date: 04/05/2020 Last Medication intake:  Today

## 2020-05-04 NOTE — Progress Notes (Signed)
PROVIDER NOTE: Information contained herein reflects review and annotations entered in association with encounter. Interpretation of such information and data should be left to medically-trained personnel. Information provided to patient can be located elsewhere in the medical record under "Patient Instructions". Document created using STT-dictation technology, any transcriptional errors that may result from process are unintentional.    Patient: [name] [name].  Service Category: E/M  Provider: Gillis Santa, MD  DOB: [birth year]  DOS: 05/04/2020  Specialty: Interventional Pain Management  MRN: [id number]  Setting: Ambulatory outpatient  PCP: Ranae Plumber, PA  Type: Established Patient    Referring Provider: Ranae Plumber, PA  Location: Office  Delivery: Face-to-face     HPI  Reason for encounter: Mr. Eidan Muellner., a 64 y.o. year old male, is here today for evaluation and management of his Chronic chest wall pain [R07.89, G89.29]. Mr. Knope primary complain today is Chest Pain, Hip Pain (bilateral), Leg Pain (bilateral), and Back Pain (lower) Last encounter: Practice (02/03/2020). My last encounter with him was on Visit date not found. Pertinent problems: Mr. Burak has Coronary artery disease; Chronic shoulder pain (Third area of Pain) (Right); Chronic hip pain (Left); Opiate use; Chronic chest wall pain (Primary Area of Pain) (Incisional Midline) (since 04/22/2012); Chronic lower extremity pain (Secondary area of Pain) (Bilateral) (R>L); Chronic sacroiliac joint pain (Bilateral) (L>R); Neuropathic pain; Lumbar facet syndrome (Bilateral) (R>L); Lumbar spondylosis; Long term current use of opiate analgesic; Keloid skin disorder; Chronic pain syndrome; DDD (degenerative disc disease), lumbosacral; Lumbar facet arthropathy (Multilevel) (Bilateral); Grade 1 Anterolisthesis of L4 on L5; Spondylosis without myelopathy or radiculopathy, lumbosacral region; and Chronic low back pain (Bilateral) (R>L) on  their pertinent problem list. Pain Assessment: Severity of Chronic pain is reported as a 3 /10. Location: Back Lower/denies. Onset: More than a month ago. Quality: Constant. Timing: Constant. Modifying factor(s): medications. Vitals:  height is '5\' 3"'  (1.6 m) and weight is 165 lb (74.8 kg). His temporal temperature is 97.2 F (36.2 C) (abnormal). His blood pressure is 142/97 (abnormal) and his pulse is 81. His respiration is 16 and oxygen saturation is 97%.   No change in medical history since last visit.  Patient's pain is at baseline.  Patient continues multimodal pain regimen as prescribed.  States that it provides pain relief and improvement in functional status.   Pharmacotherapy Assessment   04/05/2020  3   02/03/2020  Hydrocodone-Acetamin 5-325 MG  60.00  30 Fr Nav   [id number]   Nor (1409)   0/0  10.00 MME  Medicare   [place]     Analgesic: Norco 5 mg twice daily as needed, quantity 60/month MME equals 10  Monitoring:  PMP: PDMP not reviewed this encounter.       Pharmacotherapy: No side-effects or adverse reactions reported. Compliance: No problems identified. Effectiveness: Clinically acceptable.  Landis Martins, RN  05/04/2020 12:56 PM  Sign when Signing Visit Nursing Pain Medication Assessment:  Safety precautions to be maintained throughout the outpatient stay will include: orient to surroundings, keep bed in low position, maintain call bell within reach at all times, provide assistance with transfer out of bed and ambulation.  Medication Inspection Compliance: Pill count conducted under aseptic conditions, in front of the patient. Neither the pills nor the bottle was removed from the patient's sight at any time. Once count was completed pills were immediately returned to the patient in their original bottle.  Medication: Hydrocodone/APAP Pill/Patch Count: 3 of 60 pills remain Pill/Patch Appearance: Markings consistent with  prescribed medication Bottle Appearance: Standard  pharmacy container. Clearly labeled. Filled Date: 04/05/2020 Last Medication intake:  Today    UDS:  Summary  Date Value Ref Range Status  05/06/2018 FINAL  Final    Comment:    ==================================================================== TOXASSURE SELECT 13 (MW) ==================================================================== Test                             Result       Flag       Units Drug Present and Declared for Prescription Verification   Hydrocodone                    924          EXPECTED   ng/mg creat   Hydromorphone                  159          EXPECTED   ng/mg creat   Dihydrocodeine                 38           EXPECTED   ng/mg creat   Norhydrocodone                 636          EXPECTED   ng/mg creat    Sources of hydrocodone include scheduled prescription    medications. Hydromorphone, dihydrocodeine and norhydrocodone are    expected metabolites of hydrocodone. Hydromorphone and    dihydrocodeine are also available as scheduled prescription    medications. ==================================================================== Test                      Result    Flag   Units      Ref Range   Creatinine              245              mg/dL      >=20 ==================================================================== Declared Medications:  The flagging and interpretation on this report are based on the  following declared medications.  Unexpected results may arise from  inaccuracies in the declared medications.  **Note: The testing scope of this panel includes these medications:  Hydrocodone (Norco)  **Note: The testing scope of this panel does not include following  reported medications:  Acetaminophen (Norco)  Allopurinol (Zyloprim)  Amlodipine (Norvasc)  Aspirin  Atorvastatin (Lipitor)  Gabapentin (Neurontin)  Hydrochlorothiazide (Hydrodiuril)  Insulin (Levemir)  Isosorbide (Imdur)  Linagliptin (Tradjenta)  Losartan (Cozaar)  Metoprolol  (Lopressor)  Multivitamin  Nitroglycerin (Nitrostat)  Potassium  Vitamin D3 ==================================================================== For clinical consultation, please call [phone]. ====================================================================      ROS  Constitutional: Denies any fever or chills Gastrointestinal: No reported hemesis, hematochezia, vomiting, or acute GI distress Musculoskeletal: +lbp Neurological: No reported episodes of acute onset apraxia, aphasia, dysarthria, agnosia, amnesia, paralysis, loss of coordination, or loss of consciousness  Medication Review  HYDROcodone-acetaminophen, Insulin Pen Needle, Potassium, Semaglutide(0.25 or 0.5MG/DOS), Vitamin D3, allopurinol, aspirin EC, atorvastatin, gabapentin, hydrochlorothiazide, insulin detemir, isosorbide mononitrate, linagliptin, losartan, metoprolol tartrate, multivitamin with minerals, and nitroGLYCERIN  History Review  Allergy: Mr. Rowe has No Known Allergies. Drug: Mr. Negron  reports no history of drug use. Alcohol:  reports current alcohol use. Tobacco:  reports that he has quit smoking. He quit after 25.00 years of use. He has quit using smokeless tobacco. Social: Mr. Weathington  reports that he has quit smoking. He quit after 25.00 years of use. He has quit using smokeless tobacco. He reports current alcohol use. He reports that he does not use drugs. Medical:  has a past medical history of Benign esophageal stricture, Cardiomyopathy, secondary ([place]), Coronary artery disease, Diverticulitis, GI bleed, Gout, Hemorrhoids, Hyperlipidemia, Hypertension, Sleep apnea, and Tubular adenoma of colon. Surgical: Mr. Chaput  has a past surgical history that includes Coronary angioplasty with stent; cardiac bypass; Cardiac surgery; Coronary artery bypass graft; Colonoscopy with propofol (N/A, 04/29/2015); Esophagogastroduodenoscopy (egd) with propofol (N/A, 04/29/2015); Septoplasty; and Cardiac  catheterization (Left, 02/03/2016). Family: family history includes Heart disease in his mother; Hypertension in his father.  Laboratory Chemistry Profile   Renal Lab Results  Component Value Date   BUN 21 05/07/2019   CREATININE 1.39 (H) 05/07/2019   GFRAA >60 05/07/2019   GFRNONAA 53 (L) 05/07/2019     Hepatic Lab Results  Component Value Date   AST 46 (H) 05/07/2019   ALT 57 (H) 05/07/2019   ALBUMIN 4.3 05/07/2019   ALKPHOS 77 05/07/2019   LIPASE 29 03/19/2015     Electrolytes Lab Results  Component Value Date   NA 137 05/07/2019   K 4.0 05/07/2019   CL 99 05/07/2019   CALCIUM 10.2 05/07/2019   MG 1.9 05/07/2019     Bone Lab Results  Component Value Date   VD25OH 74.7 05/07/2019   VD125OH2TOT 30.8 10/26/2015     Inflammation (CRP: Acute Phase) (ESR: Chronic Phase) Lab Results  Component Value Date   CRP 0.9 05/07/2019   ESRSEDRATE 41 (H) 05/07/2019   LATICACIDVEN 1.4 05/28/2016       Note: Above Lab results reviewed.  Recent Imaging Review  DG Shoulder Left CLINICAL DATA:  Left shoulder pain over the last few days which increases with movement. Tender to palpation. Initial encounter.  EXAM: LEFT SHOULDER - 2+ VIEW  COMPARISON:  None.  FINDINGS: The left shoulder is located. No acute abnormality is present. Calcification anterior to the proximal humerus on the axillary view may be within the tendon. Visualized hemithorax is clear.  IMPRESSION: 1. No acute abnormality. 2. Question calcific tendinitis along the deltoid.  Electronically Signed   By: San Morelle M.D.   On: 02/12/2019 07:48 Note: Reviewed        Physical Exam  General appearance: Well nourished, well developed, and well hydrated. In no apparent acute distress Mental status: Alert, oriented x 3 (person, place, & time)       Respiratory: No evidence of acute respiratory distress Eyes: PERLA Vitals: BP (!) 142/97   Pulse 81   Temp (!) 97.2 F (36.2 C) (Temporal)    Resp 16   Ht '5\' 3"'  (1.6 m)   Wt 165 lb (74.8 kg)   SpO2 97%   BMI 29.23 kg/m  BMI: Estimated body mass index is 29.23 kg/m as calculated from the following:   Height as of this encounter: '5\' 3"'  (1.6 m).   Weight as of this encounter: 165 lb (74.8 kg). Ideal: Ideal body weight: 56.9 kg (125 lb 7.1 oz) Adjusted ideal body weight: 64.1 kg (141 lb 4.2 oz)  Lumbar Spine Area Exam  Skin & Axial Inspection: No masses, redness, or swelling Alignment: Symmetrical Functional ROM: Decreased ROM affecting primarily the left Stability: No instability detected Muscle Tone/Strength: Functionally intact. No obvious neuro-muscular anomalies detected. Sensory (Neurological): Musculoskeletal pain pattern  Gait & Posture Assessment  Ambulation: Unassisted Gait: Relatively normal for age and body habitus  Posture: WNL  Lower Extremity Exam    Side: Right lower extremity  Side: Left lower extremity  Stability: No instability observed          Stability: No instability observed          Skin & Extremity Inspection: Skin color, temperature, and hair growth are WNL. No peripheral edema or cyanosis. No masses, redness, swelling, asymmetry, or associated skin lesions. No contractures.  Skin & Extremity Inspection: Skin color, temperature, and hair growth are WNL. No peripheral edema or cyanosis. No masses, redness, swelling, asymmetry, or associated skin lesions. No contractures.  Functional ROM: Unrestricted ROM                  Functional ROM: Unrestricted ROM                  Muscle Tone/Strength: Functionally intact. No obvious neuro-muscular anomalies detected.  Muscle Tone/Strength: Functionally intact. No obvious neuro-muscular anomalies detected.  Sensory (Neurological): Unimpaired        Sensory (Neurological): Unimpaired        DTR: Patellar: deferred today Achilles: deferred today Plantar: deferred today  DTR: Patellar: deferred today Achilles: deferred today Plantar: deferred today   Palpation: No palpable anomalies  Palpation: No palpable anomalies    Assessment   Status Diagnosis  Controlled Controlled Controlled 1. Chronic chest wall pain (Primary Area of Pain) (Incisional Midline) (since 04/22/2012)   2. Chronic pain syndrome   3. Chronic lower extremity pain (Secondary area of Pain) (Bilateral) (R>L)   4. Chronic shoulder pain (Third area of Pain) (Right)   5. Neurogenic pain   6. Lumbar spondylosis   7. Spondylosis without myelopathy or radiculopathy, lumbosacral region   8. Lumbar facet arthropathy (Multilevel) (Bilateral)      Updated Problems: Problem  Spondylosis Without Myelopathy Or Radiculopathy, Lumbosacral Region  Chronic low back pain (Bilateral) (R>L)  Ddd (Degenerative Disc Disease), Lumbosacral  Lumbar facet arthropathy (Multilevel) (Bilateral)  Grade 1 Anterolisthesis of L4 on L5  Chronic Pain Syndrome  Keloid Skin Disorder  Long Term Current Use of Opiate Analgesic  Lumbar facet syndrome (Bilateral) (R>L)  Lumbar Spondylosis  Chronic shoulder pain (Third area of Pain) (Right)  Chronic hip pain (Left)  Opiate Use  Chronic chest wall pain (Primary Area of Pain) (Incisional Midline) (since 04/22/2012)  Chronic lower extremity pain (Secondary area of Pain) (Bilateral) (R>L)  Chronic sacroiliac joint pain (Bilateral) (L>R)  Neuropathic Pain  Coronary Artery Disease   Overview:  Coronary artery bypass grafting on 04/22/12 with a free LIMA to the LAD, saphenous vein graft to PDA, saphenous vein graft to OM2, saphenous vein graft to ramus intermedius.  Overview:  Coronary artery bypass grafting on 04/22/12 with a free LIMA to the LAD, saphenous vein graft to PDA, saphenous vein graft to OM2, saphenous vein graft to ramus intermedius.  Formatting of this note might be different from the original. Coronary artery bypass grafting on 04/22/12 with a free LIMA to the LAD, saphenous vein graft to PDA, saphenous vein graft to OM2, saphenous  vein graft to ramus intermedius.     Plan of Care   Mr. Nickalas Mccarrick. has a current medication list which includes the following long-term medication(s): allopurinol, atorvastatin, gabapentin, insulin detemir, isosorbide mononitrate, linagliptin, metoprolol tartrate, potassium, [START ON 05/05/2020] hydrocodone-acetaminophen, [START ON 06/04/2020] hydrocodone-acetaminophen, and [START ON 07/04/2020] hydrocodone-acetaminophen.  Pharmacotherapy (Medications Ordered): Meds ordered this encounter  Medications  . HYDROcodone-acetaminophen (NORCO/VICODIN) 5-325 MG tablet    Sig: Take  1 tablet by mouth 2 (two) times daily as needed for severe pain. Must last 30 days    Dispense:  60 tablet    Refill:  0    Chronic Pain: STOP Act (Not applicable) Fill 1 day early if closed on refill date.  Marland Kitchen HYDROcodone-acetaminophen (NORCO/VICODIN) 5-325 MG tablet    Sig: Take 1 tablet by mouth 2 (two) times daily as needed for severe pain. Must last 30 days    Dispense:  60 tablet    Refill:  0    Chronic Pain: STOP Act (Not applicable) Fill 1 day early if closed on refill date.  Marland Kitchen HYDROcodone-acetaminophen (NORCO/VICODIN) 5-325 MG tablet    Sig: Take 1 tablet by mouth 2 (two) times daily as needed for severe pain. Must last 30 days    Dispense:  60 tablet    Refill:  0    Chronic Pain: STOP Act (Not applicable) Fill 1 day early if closed on refill date.   Orders:  Orders Placed This Encounter  Procedures  . ToxASSURE Select 13 (MW), Urine    Volume: 30 ml(s). Minimum 3 ml of urine is needed. Document temperature of fresh sample. Indications: Long term (current) use of opiate analgesic [phone])    Order Specific Question:   Release to patient    Answer:   Immediate   Follow-up plan:   Return in about 3 months (around 08/03/2020) for Medication Management, in person (Dr Delane Ginger).   Recent Visits No visits were found meeting these conditions. Showing recent visits within past 90 days and meeting all other  requirements Today's Visits Date Type Provider Dept  05/04/20 Office Visit Gillis Santa, MD [facility]  Showing today's visits and meeting all other requirements Future Appointments Date Type Provider Dept  07/25/20 Appointment Milinda Pointer, MD [facility]  Showing future appointments within next 90 days and meeting all other requirements  I discussed the assessment and treatment plan with the patient. The patient was provided an opportunity to ask questions and all were answered. The patient agreed with the plan and demonstrated an understanding of the instructions.  Patient advised to call back or seek an in-person evaluation if the symptoms or condition worsens.  Duration of encounter: 41mnutes.  Note by: BGillis Santa MD Date: 05/04/2020; Time: 3:15 PM

## 2020-05-12 LAB — TOXASSURE SELECT 13 (MW), URINE

## 2020-05-18 ENCOUNTER — Ambulatory Visit: Payer: Medicare Other | Admitting: Pain Medicine

## 2020-07-18 ENCOUNTER — Other Ambulatory Visit: Payer: Self-pay

## 2020-07-18 ENCOUNTER — Other Ambulatory Visit
Admission: RE | Admit: 2020-07-18 | Discharge: 2020-07-18 | Disposition: A | Discharge status: Home / Self Care | Payer: Medicare Other | Source: Ambulatory Visit | Attending: Internal Medicine | Admitting: Internal Medicine

## 2020-07-18 DIAGNOSIS — Z01812 Encounter for preprocedural laboratory examination: Secondary | ICD-10-CM | POA: Insufficient documentation

## 2020-07-18 DIAGNOSIS — Z20822 Contact with and (suspected) exposure to covid-19: Secondary | ICD-10-CM | POA: Diagnosis not present

## 2020-07-19 ENCOUNTER — Encounter: Payer: Self-pay | Admitting: Internal Medicine

## 2020-07-19 LAB — SARS CORONAVIRUS 2 (TAT 6-24 HRS): SARS Coronavirus 2: NEGATIVE

## 2020-07-20 ENCOUNTER — Ambulatory Visit: Payer: Medicare Other | Admitting: Anesthesiology

## 2020-07-20 ENCOUNTER — Encounter: Payer: Self-pay | Admitting: Internal Medicine

## 2020-07-20 ENCOUNTER — Ambulatory Visit
Admission: RE | Admit: 2020-07-20 | Discharge: 2020-07-20 | Disposition: A | Discharge status: Home / Self Care | Payer: Medicare Other | Attending: Internal Medicine | Admitting: Internal Medicine

## 2020-07-20 ENCOUNTER — Other Ambulatory Visit: Payer: Self-pay

## 2020-07-20 ENCOUNTER — Encounter
Admission: RE | Disposition: A | Discharge status: Home / Self Care | Payer: Self-pay | Source: Home / Self Care | Attending: Internal Medicine

## 2020-07-20 DIAGNOSIS — Z1211 Encounter for screening for malignant neoplasm of colon: Secondary | ICD-10-CM | POA: Insufficient documentation

## 2020-07-20 DIAGNOSIS — Z7982 Long term (current) use of aspirin: Secondary | ICD-10-CM | POA: Diagnosis not present

## 2020-07-20 DIAGNOSIS — Z79899 Other long term (current) drug therapy: Secondary | ICD-10-CM | POA: Insufficient documentation

## 2020-07-20 DIAGNOSIS — Z794 Long term (current) use of insulin: Secondary | ICD-10-CM | POA: Insufficient documentation

## 2020-07-20 DIAGNOSIS — Z86010 Personal history of colonic polyps: Secondary | ICD-10-CM | POA: Diagnosis present

## 2020-07-20 DIAGNOSIS — K640 First degree hemorrhoids: Secondary | ICD-10-CM | POA: Diagnosis not present

## 2020-07-20 DIAGNOSIS — K5730 Diverticulosis of large intestine without perforation or abscess without bleeding: Secondary | ICD-10-CM | POA: Insufficient documentation

## 2020-07-20 DIAGNOSIS — Z87891 Personal history of nicotine dependence: Secondary | ICD-10-CM | POA: Insufficient documentation

## 2020-07-20 HISTORY — DX: Type 2 diabetes mellitus without complications: E11.9

## 2020-07-20 HISTORY — PX: COLONOSCOPY WITH PROPOFOL: SHX5780

## 2020-07-20 LAB — GLUCOSE, CAPILLARY: Glucose-Capillary: 120 mg/dL — ABNORMAL HIGH (ref 70–99)

## 2020-07-20 SURGERY — COLONOSCOPY WITH PROPOFOL
Anesthesia: General

## 2020-07-20 MED ORDER — PROPOFOL 500 MG/50ML IV EMUL
INTRAVENOUS | Status: AC
  Filled 2020-07-20: qty 50

## 2020-07-20 MED ORDER — SODIUM CHLORIDE 0.9 % IV SOLN: INTRAVENOUS | Status: DC

## 2020-07-20 MED ORDER — PROPOFOL 500 MG/50ML IV EMUL
INTRAVENOUS | Status: DC | PRN
  Administered 2020-07-20: 130 ug/kg/min via INTRAVENOUS

## 2020-07-20 MED ORDER — PROPOFOL 10 MG/ML IV BOLUS
INTRAVENOUS | Status: DC | PRN
  Administered 2020-07-20: 90 mg via INTRAVENOUS

## 2020-07-20 MED ORDER — LIDOCAINE HCL (CARDIAC) PF 100 MG/5ML IV SOSY
PREFILLED_SYRINGE | INTRAVENOUS | Status: DC | PRN
  Administered 2020-07-20: 50 mg via INTRAVENOUS

## 2020-07-20 NOTE — H&P (Signed)
Outpatient short stay form Pre-procedure 07/20/2020 2:46 PM [name] [name] K. Alice Reichert, M.D.  Primary Physician: Ranae Plumber, PA-C  Reason for visit:  Personal history of colon polyps  History of present illness:                            Patient presents for colonoscopy for a personal hx of colon polyps. The patient denies abdominal pain, abnormal weight loss or rectal bleeding.      Current Facility-Administered Medications:  .  0.9 %  sodium chloride infusion, , Intravenous, Continuous, [place], Benay Pike, MD, [place] Bag at 07/20/20 1443  Facility-Administered Medications Ordered in Other Encounters:  .  lidocaine (cardiac) 100 mg/3mL (XYLOCAINE) injection 2%, , Intravenous, Anesthesia Intra-op, Eben Burow, CRNA, 50 mg at 07/20/20 1445  Medications Prior to Admission  Medication Sig Dispense Refill Last Dose  . allopurinol (ZYLOPRIM) 100 MG tablet Take 100 mg by mouth daily.    07/19/2020 at Unknown time  . aspirin EC 81 MG tablet Take 81 mg by mouth every morning.   07/19/2020 at Unknown time  . atorvastatin (LIPITOR) 40 MG tablet Take 40 mg by mouth at bedtime.    07/19/2020 at Unknown time  . B-D UF III MINI PEN NEEDLES 31G X 5 MM MISC U WITH INSULIN PEN INJECTIONS D   07/19/2020 at Unknown time  . Cholecalciferol (VITAMIN D3) 2000 units TABS Take 2,000 Units by mouth every morning.   07/19/2020 at Unknown time  . gabapentin (NEURONTIN) 600 MG tablet Take 1 tablet (600 mg total) by mouth 2 (two) times daily. 60 tablet 5 07/19/2020 at Unknown time  . hydrochlorothiazide (HYDRODIURIL) 25 MG tablet Take 25 mg by mouth daily.    07/19/2020 at Unknown time  . HYDROcodone-acetaminophen (NORCO/VICODIN) 5-325 MG tablet Take 1 tablet by mouth 2 (two) times daily as needed for severe pain. Must last 30 days 60 tablet 0 07/19/2020 at 17oo  . Insulin Detemir (LEVEMIR FLEXTOUCH) 100 UNIT/ML Pen Inject 46 Units into the skin 2 (two) times daily.    07/19/2020 at Unknown time  . isosorbide mononitrate  (IMDUR) 30 MG 24 hr tablet Take 30 mg by mouth every morning.   1 07/19/2020 at Unknown time  . linagliptin (TRADJENTA) 5 MG TABS tablet Take 5 mg by mouth daily.   Past Week at Unknown time  . losartan (COZAAR) 100 MG tablet take 1 tablet by mouth once daily   07/19/2020 at Unknown time  . metoprolol (LOPRESSOR) 50 MG tablet Take 50 mg by mouth 3 (three) times daily.   07/19/2020 at Unknown time  . Multiple Vitamin (MULTIVITAMIN WITH MINERALS) TABS tablet Take 1 tablet by mouth every evening.   07/19/2020 at Unknown time  . OZEMPIC, 0.25 OR 0.5 MG/DOSE, 2 MG/1.5ML SOPN Inject 0.5 mg as directed once a week.   Past Week at Unknown time  . Potassium 95 MG TABS Take 99 mg by mouth every morning.    07/19/2020 at 0900  . HYDROcodone-acetaminophen (NORCO/VICODIN) 5-325 MG tablet Take 1 tablet by mouth 2 (two) times daily as needed for severe pain. Must last 30 days 60 tablet 0   . HYDROcodone-acetaminophen (NORCO/VICODIN) 5-325 MG tablet Take 1 tablet by mouth 2 (two) times daily as needed for severe pain. Must last 30 days 60 tablet 0   . nitroGLYCERIN (NITROSTAT) 0.4 MG SL tablet Place 0.4 mg under the tongue every 5 (five) minutes x 3 doses as needed for chest pain.  No Known Allergies   Past Medical History:  Diagnosis Date  . Benign esophageal stricture   . Cardiomyopathy, secondary ([place])   . Coronary artery disease   . Diabetes mellitus without complication ([place])   . Diverticulitis   . GI bleed   . Gout   . Hemorrhoids   . Hyperlipidemia   . Hypertension   . Sleep apnea   . Tubular adenoma of colon     Review of systems:  Otherwise negative.    Physical Exam  Gen: Alert, oriented. Appears stated age.  HEENT: [place]/AT. PERRLA. Lungs: CTA, no wheezes. CV: RR nl S1, S2. Abd: soft, benign, no masses. BS+ Ext: No edema. Pulses 2+    Planned procedures: Proceed with colonoscopy. The patient understands the nature of the planned procedure, indications, risks, alternatives  and potential complications including but not limited to bleeding, infection, perforation, damage to internal organs and possible oversedation/side effects from anesthesia. The patient agrees and gives consent to proceed.  Please refer to procedure notes for findings, recommendations and patient disposition/instructions.     [name] [name] K. Alice Reichert, M.D. Gastroenterology 07/20/2020  2:46 PM

## 2020-07-20 NOTE — Anesthesia Preprocedure Evaluation (Addendum)
Anesthesia Evaluation  Patient identified by MRN, date of birth, ID band Patient awake    Reviewed: Allergy & Precautions, NPO status , Patient's Chart, lab work & pertinent test results  History of Anesthesia Complications Negative for: history of anesthetic complications  Airway Mallampati: III  TM Distance: >3 FB Neck ROM: Full    Dental  (+) Poor Dentition   Pulmonary sleep apnea , COPD, former smoker,    breath sounds clear to auscultation- rhonchi (-) wheezing      Cardiovascular hypertension, Pt. on medications + CAD, + Cardiac Stents and + CABG   Rhythm:Regular Rate:Normal - Systolic murmurs and - Diastolic murmurs    Neuro/Psych neg Seizures negative neurological ROS  negative psych ROS   GI/Hepatic negative GI ROS, Neg liver ROS,   Endo/Other  diabetes, Insulin Dependent  Renal/GU      Musculoskeletal  (+) Arthritis ,   Abdominal (+) - obese,   Peds  Hematology negative hematology ROS (+)   Anesthesia Other Findings Past Medical History: No date: Benign esophageal stricture No date: Cardiomyopathy, secondary ([place]) No date: Coronary artery disease No date: Diabetes mellitus without complication ([facility]) No date: Diverticulitis No date: GI bleed No date: Gout No date: Hemorrhoids No date: Hyperlipidemia No date: Hypertension No date: Sleep apnea No date: Tubular adenoma of colon   Reproductive/Obstetrics                          Anesthesia Physical Anesthesia Plan  ASA: III  Anesthesia Plan: General   Post-op Pain Management:    Induction: Intravenous  PONV Risk Score and Plan: 1 and Propofol infusion  Airway Management Planned: Natural Airway  Additional Equipment:   Intra-op Plan:   Post-operative Plan:   Informed Consent: I have reviewed the patients History and Physical, chart, labs and discussed the procedure including the risks, benefits and alternatives  for the proposed anesthesia with the patient or authorized representative who has indicated his/her understanding and acceptance.     Dental advisory given  Plan Discussed with: CRNA and Anesthesiologist  Anesthesia Plan Comments:         Anesthesia Quick Evaluation

## 2020-07-20 NOTE — Op Note (Signed)
[facility] Gastroenterology Patient Name: [name] [name] Procedure Date: 07/20/2020 2:44 PM MRN: [id number] Account #: [id number] Date of Birth: May 07, 1956 Admit Type: Outpatient Age: 64 Room: [facility] ENDO ROOM 2 Gender: Male Note Status: Finalized Procedure:             Colonoscopy Indications:           Surveillance: Personal history of adenomatous polyps                         on last colonoscopy > 5 years ago Providers:             Lorie Apley K. Genesys Coggeshall MD, MD Medicines:             Propofol per Anesthesia Complications:         No immediate complications. Procedure:             Pre-Anesthesia Assessment:                        - The risks and benefits of the procedure and the                         sedation options and risks were discussed with the                         patient. All questions were answered and informed                         consent was obtained.                        - Patient identification and proposed procedure were                         verified prior to the procedure by the nurse. The                         procedure was verified in the procedure room.                        - ASA Grade Assessment: III - A patient with severe                         systemic disease.                        - After reviewing the risks and benefits, the patient                         was deemed in satisfactory condition to undergo the                         procedure.                        After obtaining informed consent, the colonoscope was                         passed under direct vision. Throughout the procedure,  the patient's blood pressure, pulse, and oxygen                         saturations were monitored continuously. The                         Colonoscope was introduced through the anus and                         advanced to the the cecum, identified by appendiceal                         orifice and ileocecal  valve. The colonoscopy was                         performed without difficulty. The patient tolerated                         the procedure well. The quality of the bowel                         preparation was good. Findings:      The perianal and digital rectal examinations were normal. Pertinent       negatives include normal sphincter tone and no palpable rectal lesions.      Non-bleeding internal hemorrhoids were found during retroflexion. The       hemorrhoids were Grade I (internal hemorrhoids that do not prolapse).      Multiple medium-mouthed diverticula were found in the sigmoid colon.       There was no evidence of diverticular bleeding.      A few medium-mouthed diverticula were found in the transverse colon and       ascending colon. There was no evidence of diverticular bleeding.      The exam was otherwise without abnormality. Impression:            - Non-bleeding internal hemorrhoids.                        - Moderate diverticulosis in the sigmoid colon. There                         was no evidence of diverticular bleeding.                        - Mild diverticulosis in the transverse colon and in                         the ascending colon. There was no evidence of                         diverticular bleeding.                        - The examination was otherwise normal.                        - No specimens collected. Recommendation:        - Patient has a contact number available for  emergencies. The signs and symptoms of potential                         delayed complications were discussed with the patient.                         Return to normal activities tomorrow. Written                         discharge instructions were provided to the patient.                        - Resume previous diet.                        - Continue present medications.                        - Repeat colonoscopy in 5 years for surveillance.                         - Return to GI office PRN.                        - The findings and recommendations were discussed with                         the patient. Procedure Code(s):     --- Professional ---                        S9233, Colorectal cancer screening; colonoscopy on                         individual at high risk Diagnosis Code(s):     --- Professional ---                        K57.30, Diverticulosis of large intestine without                         perforation or abscess without bleeding                        K64.0, First degree hemorrhoids                        Z86.010, Personal history of colonic polyps CPT copyright 2019 American Medical Association. All rights reserved. The codes documented in this report are preliminary and upon coder review may  be revised to meet current compliance requirements. Efrain Sella MD, MD 07/20/2020 3:18:41 PM This report has been signed electronically. Number of Addenda: 0 Note Initiated On: 07/20/2020 2:44 PM Scope Withdrawal Time: 0 hours 7 minutes 38 seconds  Total Procedure Duration: 0 hours 18 minutes 35 seconds  Estimated Blood Loss:  Estimated blood loss: none.      [facility]

## 2020-07-20 NOTE — Transfer of Care (Signed)
Immediate Anesthesia Transfer of Care Note  Patient: [name] [name].  Procedure(s) Performed: COLONOSCOPY WITH PROPOFOL (N/A )  Patient Location: PACU and Endoscopy Unit  Anesthesia Type:General  Level of Consciousness: drowsy  Airway & Oxygen Therapy: Patient Spontanous Breathing  Post-op Assessment: Report given to RN and Post -op Vital signs reviewed and stable  Post vital signs: Reviewed and stable  Last Vitals:  Vitals Value Taken Time  BP 100/67 07/20/20 1519  Temp    Pulse 84 07/20/20 1519  Resp 14 07/20/20 1519  SpO2 92 % 07/20/20 1519  Vitals shown include unvalidated device data.  Last Pain:  Vitals:   07/20/20 1306  TempSrc: Temporal  PainSc: 0-No pain      Patients Stated Pain Goal: 0 (62/19/47 1252)  Complications: No complications documented.

## 2020-07-20 NOTE — Interval H&P Note (Signed)
History and Physical Interval Note:  07/20/2020 2:50 PM  [name] [name].  has presented today for surgery, with the diagnosis of HX ADEN POLYPS.  The various methods of treatment have been discussed with the patient and family. After consideration of risks, benefits and other options for treatment, the patient has consented to  Procedure(s): COLONOSCOPY WITH PROPOFOL (N/A) as a surgical intervention.  The patient's history has been reviewed, patient examined, no change in status, stable for surgery.  I have reviewed the patient's chart and labs.  Questions were answered to the patient's satisfaction.     [place], [place]

## 2020-07-25 ENCOUNTER — Encounter: Payer: Self-pay | Admitting: Pain Medicine

## 2020-07-25 ENCOUNTER — Other Ambulatory Visit: Payer: Self-pay

## 2020-07-25 ENCOUNTER — Ambulatory Visit: Payer: Medicare Other | Attending: Pain Medicine | Admitting: Pain Medicine

## 2020-07-25 VITALS — BP 141/84 | HR 76 | Temp 97.0°F | Ht 63.0 in | Wt 165.0 lb

## 2020-07-25 DIAGNOSIS — M79605 Pain in left leg: Secondary | ICD-10-CM | POA: Diagnosis present

## 2020-07-25 DIAGNOSIS — G894 Chronic pain syndrome: Secondary | ICD-10-CM | POA: Diagnosis not present

## 2020-07-25 DIAGNOSIS — R0789 Other chest pain: Secondary | ICD-10-CM | POA: Diagnosis not present

## 2020-07-25 DIAGNOSIS — G8929 Other chronic pain: Secondary | ICD-10-CM | POA: Diagnosis present

## 2020-07-25 DIAGNOSIS — M25511 Pain in right shoulder: Secondary | ICD-10-CM | POA: Diagnosis not present

## 2020-07-25 DIAGNOSIS — M792 Neuralgia and neuritis, unspecified: Secondary | ICD-10-CM | POA: Insufficient documentation

## 2020-07-25 DIAGNOSIS — Z79899 Other long term (current) drug therapy: Secondary | ICD-10-CM | POA: Diagnosis present

## 2020-07-25 DIAGNOSIS — M79604 Pain in right leg: Secondary | ICD-10-CM | POA: Diagnosis not present

## 2020-07-25 MED ORDER — HYDROCODONE-ACETAMINOPHEN 5-325 MG PO TABS: 1.0000 | ORAL_TABLET | Freq: Two times a day (BID) | ORAL | 0 refills | Status: DC | PRN

## 2020-07-25 MED ORDER — GABAPENTIN 600 MG PO TABS: 600.0000 mg | ORAL_TABLET | Freq: Two times a day (BID) | ORAL | 2 refills | Status: DC

## 2020-07-25 NOTE — Progress Notes (Signed)
Nursing Pain Medication Assessment:  Safety precautions to be maintained throughout the outpatient stay will include: orient to surroundings, keep bed in low position, maintain call bell within reach at all times, provide assistance with transfer out of bed and ambulation.  Medication Inspection Compliance: Pill count conducted under aseptic conditions, in front of the patient. Neither the pills nor the bottle was removed from the patient's sight at any time. Once count was completed pills were immediately returned to the patient in their original bottle.  Medication: Hydrocodone/APAP Pill/Patch Count: 19 of 60 pills remain Pill/Patch Appearance: Markings consistent with prescribed medication Bottle Appearance: Standard pharmacy container. Clearly labeled. Filled Date: 25 / 1 / 21 Last Medication intake:  TodaySafety precautions to be maintained throughout the outpatient stay will include: orient to surroundings, keep bed in low position, maintain call bell within reach at all times, provide assistance with transfer out of bed and ambulation.

## 2020-07-25 NOTE — Progress Notes (Signed)
PROVIDER NOTE: Information contained herein reflects review and annotations entered in association with encounter. Interpretation of such information and data should be left to medically-trained personnel. Information provided to patient can be located elsewhere in the medical record under "Patient Instructions". Document created using STT-dictation technology, any transcriptional errors that may result from process are unintentional.    Patient: [name] [name].  Service Category: E/M  Provider: Gaspar Cola, MD  DOB: 1956-05-09  DOS: 07/25/2020  Specialty: Interventional Pain Management  MRN: [id number]  Setting: Ambulatory outpatient  PCP: Ranae Plumber, PA  Type: Established Patient    Referring Provider: Ranae Plumber, PA  Location: Office  Delivery: Face-to-face     HPI-  Mr. Shoji Pertuit., a 64 y.o. year old male, is here today because of his Chronic pain syndrome [G89.4]. Mr. Knittle primary complain today is Leg Pain Last encounter: My last encounter with him was on 02/03/2020. Pertinent problems: Mr. Dahir has Chronic shoulder pain (3ry area of Pain) (Right); Chronic hip pain (Left); Chronic chest wall pain (1ry area of Pain) (Incisional Midline) (since 04/22/2012); Chronic lower extremity pain (2ry area of Pain) (Bilateral) (R>L); Chronic sacroiliac joint pain (Bilateral) (L>R); Neuropathic pain; Neurogenic pain; Incisional pain; Lumbar facet syndrome (Bilateral) (R>L); Lumbar spondylosis; Keloid skin disorder; Costochondritis; Brachial plexus injury, right; Right sided weakness; Chronic pain syndrome; DDD (degenerative disc disease), lumbosacral; Lumbar facet arthropathy (Multilevel) (Bilateral); Grade 1 Anterolisthesis of L4 on L5; Spondylosis without myelopathy or radiculopathy, lumbosacral region; Other specified dorsopathies, sacral and sacrococcygeal region; Chronic low back pain (Bilateral) (R>L); and Osteoarthritis of hip (Left) on their pertinent problem list. Pain  Assessment: Severity of Chronic pain is reported as a 3 /10. Location: Leg Left, Right/pain radiaties down both leg to his feet. Quality: Aching, Burning, Constant. Modifying factor(s): meds. Vitals:  height is '5\' 3"'  (1.6 m) and weight is 165 lb (74.8 kg). His temperature is 97 F (36.1 C) (abnormal). His blood pressure is 141/84 (abnormal) and his pulse is 76. His oxygen saturation is 99%.   Reason for encounter: medication management.  The patient indicates doing well with the current medication regimen. No adverse reactions or side effects reported to the medications.   RTCB: 11/01/2020 Nonopioids transferred 07/25/2020: Gabapentin  Pharmacotherapy Assessment   Analgesic: Hydrocodone/APAP 5/325 mg, 1 tab PO BID (10 mg/day of hydrocodone) MME/day: 10 mg/day.   Monitoring: [place] PMP: PDMP reviewed during this encounter.       Pharmacotherapy: No side-effects or adverse reactions reported. Compliance: No problems identified. Effectiveness: Clinically acceptable.  Chauncey Fischer, RN  07/25/2020  1:21 PM  Sign when Signing Visit Nursing Pain Medication Assessment:  Safety precautions to be maintained throughout the outpatient stay will include: orient to surroundings, keep bed in low position, maintain call bell within reach at all times, provide assistance with transfer out of bed and ambulation.  Medication Inspection Compliance: Pill count conducted under aseptic conditions, in front of the patient. Neither the pills nor the bottle was removed from the patient's sight at any time. Once count was completed pills were immediately returned to the patient in their original bottle.  Medication: Hydrocodone/APAP Pill/Patch Count: 19 of 60 pills remain Pill/Patch Appearance: Markings consistent with prescribed medication Bottle Appearance: Standard pharmacy container. Clearly labeled. Filled Date: 82 / 1 / 21 Last Medication intake:  TodaySafety precautions to be maintained throughout the  outpatient stay will include: orient to surroundings, keep bed in low position, maintain call bell within reach at all times, provide  assistance with transfer out of bed and ambulation.     UDS:  Summary  Date Value Ref Range Status  05/10/2020 Note  Final    Comment:    ==================================================================== [employer] 13 (MW) ==================================================================== Test                             Result       Flag       Units  Drug Present and Declared for Prescription Verification   Hydrocodone                    1131         EXPECTED   ng/mg creat   Hydromorphone                  311          EXPECTED   ng/mg creat   Dihydrocodeine                 89           EXPECTED   ng/mg creat   Norhydrocodone                 525          EXPECTED   ng/mg creat    Sources of hydrocodone include scheduled prescription medications.    Hydromorphone, dihydrocodeine and norhydrocodone are expected    metabolites of hydrocodone. Hydromorphone and dihydrocodeine are    also available as scheduled prescription medications.  ==================================================================== Test                      Result    Flag   Units      Ref Range   Creatinine              192              mg/dL      >=20 ==================================================================== Declared Medications:  The flagging and interpretation on this report are based on the  following declared medications.  Unexpected results may arise from  inaccuracies in the declared medications.   **Note: The testing scope of this panel includes these medications:   Hydrocodone (Norco)   **Note: The testing scope of this panel does not include the  following reported medications:   Acetaminophen (Norco)  Allopurinol (Zyloprim)  Aspirin  Atorvastatin (Lipitor)  Gabapentin (Neurontin)  Hydrochlorothiazide (Hydrodiuril)  Insulin (Levemir)   Isosorbide (Imdur)  Linagliptin (Tradjenta)  Losartan (Cozaar)  Metoprolol (Lopressor)  Multivitamin  Nitroglycerin (Nitrostat)  Potassium  Semaglutide (Ozempic)  Vitamin D3 ==================================================================== For clinical consultation, please call [phone]. ====================================================================      ROS  Constitutional: Denies any fever or chills Gastrointestinal: No reported hemesis, hematochezia, vomiting, or acute GI distress Musculoskeletal: Denies any acute onset joint swelling, redness, loss of ROM, or weakness Neurological: No reported episodes of acute onset apraxia, aphasia, dysarthria, agnosia, amnesia, paralysis, loss of coordination, or loss of consciousness  Medication Review  HYDROcodone-acetaminophen, Insulin Pen Needle, Potassium, Semaglutide(0.25 or 0.5MG/DOS), Vitamin D3, allopurinol, aspirin EC, atorvastatin, gabapentin, hydrochlorothiazide, insulin detemir, isosorbide mononitrate, linagliptin, losartan, metoprolol tartrate, multivitamin with minerals, and nitroGLYCERIN  History Review  Allergy: Mr. Pastorino has No Known Allergies. Drug: Mr. Coopersmith  reports no history of drug use. Alcohol:  reports current alcohol use. Tobacco:  reports that he has quit smoking. He quit after 25.00 years of use. He has quit using  smokeless tobacco. Social: Mr. Rami  reports that he has quit smoking. He quit after 25.00 years of use. He has quit using smokeless tobacco. He reports current alcohol use. He reports that he does not use drugs. Medical:  has a past medical history of Benign esophageal stricture, Cardiomyopathy, secondary ([place]), Coronary artery disease, Diabetes mellitus without complication ([place]), Diverticulitis, GI bleed, Gout, Hemorrhoids, Hyperlipidemia, Hypertension, Sleep apnea, and Tubular adenoma of colon. Surgical: Mr. Plush  has a past surgical history that includes Coronary angioplasty  with stent; cardiac bypass; Cardiac surgery; Colonoscopy with propofol (N/A, 04/29/2015); Esophagogastroduodenoscopy (egd) with propofol (N/A, 04/29/2015); Septoplasty; Cardiac catheterization (Left, 02/03/2016); Coronary artery bypass graft; and Colonoscopy with propofol (N/A, 07/20/2020). Family: family history includes Heart disease in his mother; Hypertension in his father.  Laboratory Chemistry Profile   Renal Lab Results  Component Value Date   BUN 21 05/07/2019   CREATININE 1.39 (H) 05/07/2019   GFRAA >60 05/07/2019   GFRNONAA 53 (L) 05/07/2019     Hepatic Lab Results  Component Value Date   AST 46 (H) 05/07/2019   ALT 57 (H) 05/07/2019   ALBUMIN 4.3 05/07/2019   ALKPHOS 77 05/07/2019   LIPASE 29 03/19/2015     Electrolytes Lab Results  Component Value Date   NA 137 05/07/2019   K 4.0 05/07/2019   CL 99 05/07/2019   CALCIUM 10.2 05/07/2019   MG 1.9 05/07/2019     Bone Lab Results  Component Value Date   VD25OH 74.7 05/07/2019   VD125OH2TOT 30.8 10/26/2015     Inflammation (CRP: Acute Phase) (ESR: Chronic Phase) Lab Results  Component Value Date   CRP 0.9 05/07/2019   ESRSEDRATE 41 (H) 05/07/2019   LATICACIDVEN 1.4 05/28/2016       Note: Above Lab results reviewed.  Recent Imaging Review  DG Shoulder Left CLINICAL DATA:  Left shoulder pain over the last few days which increases with movement. Tender to palpation. Initial encounter.  EXAM: LEFT SHOULDER - 2+ VIEW  COMPARISON:  None.  FINDINGS: The left shoulder is located. No acute abnormality is present. Calcification anterior to the proximal humerus on the axillary view may be within the tendon. Visualized hemithorax is clear.  IMPRESSION: 1. No acute abnormality. 2. Question calcific tendinitis along the deltoid.  Electronically Signed   By: San Morelle M.D.   On: 02/12/2019 07:48 Note: Reviewed        Physical Exam  General appearance: Well nourished, well developed, and well  hydrated. In no apparent acute distress Mental status: Alert, oriented x 3 (person, place, & time)       Respiratory: No evidence of acute respiratory distress Eyes: PERLA Vitals: BP (!) 141/84   Pulse 76   Temp (!) 97 F (36.1 C)   Ht '5\' 3"'  (1.6 m)   Wt 165 lb (74.8 kg)   SpO2 99%   BMI 29.23 kg/m  BMI: Estimated body mass index is 29.23 kg/m as calculated from the following:   Height as of this encounter: '5\' 3"'  (1.6 m).   Weight as of this encounter: 165 lb (74.8 kg). Ideal: Ideal body weight: 56.9 kg (125 lb 7.1 oz) Adjusted ideal body weight: 64.1 kg (141 lb 4.2 oz)  Assessment   Status Diagnosis  Controlled Controlled Controlled 1. Chronic pain syndrome   2. Chronic chest wall pain (Primary Area of Pain) (Incisional Midline) (since 04/22/2012)   3. Chronic lower extremity pain (Secondary area of Pain) (Bilateral) (R>L)   4. Chronic shoulder pain (  Third area of Pain) (Right)   5. Pharmacologic therapy   6. Neurogenic pain      Updated Problems: Problem  Chronic shoulder pain (3ry area of Pain) (Right)  Chronic chest wall pain (1ry area of Pain) (Incisional Midline) (since 04/22/2012)  Chronic lower extremity pain (2ry area of Pain) (Bilateral) (R>L)    Plan of Care  Problem-specific:  No problem-specific Assessment & Plan notes found for this encounter.  Mr. Edahi Kroening. has a current medication list which includes the following long-term medication(s): allopurinol, atorvastatin, [START ON 08/03/2020] gabapentin, [START ON 08/03/2020] hydrocodone-acetaminophen, [START ON 09/02/2020] hydrocodone-acetaminophen, [START ON 10/02/2020] hydrocodone-acetaminophen, insulin detemir, isosorbide mononitrate, linagliptin, metoprolol tartrate, and potassium.  Pharmacotherapy (Medications Ordered): Meds ordered this encounter  Medications  . HYDROcodone-acetaminophen (NORCO/VICODIN) 5-325 MG tablet    Sig: Take 1 tablet by mouth 2 (two) times daily as needed for severe  pain. Must last 30 days    Dispense:  60 tablet    Refill:  0    Chronic Pain: STOP Act (Not applicable) Fill 1 day early if closed on refill date. Avoid benzodiazepines within 8 hours of opioids  . gabapentin (NEURONTIN) 600 MG tablet    Sig: Take 1 tablet (600 mg total) by mouth 2 (two) times daily.    Dispense:  60 tablet    Refill:  2    Fill 1 day early if pharmacy is closed on scheduled refill date. Generic permitted. Void old duplicate prescription / refill. Do not send renewal requests.  Marland Kitchen HYDROcodone-acetaminophen (NORCO/VICODIN) 5-325 MG tablet    Sig: Take 1 tablet by mouth 2 (two) times daily as needed for severe pain. Must last 30 days    Dispense:  60 tablet    Refill:  0    Chronic Pain: STOP Act (Not applicable) Fill 1 day early if closed on refill date. Avoid benzodiazepines within 8 hours of opioids  . HYDROcodone-acetaminophen (NORCO/VICODIN) 5-325 MG tablet    Sig: Take 1 tablet by mouth 2 (two) times daily as needed for severe pain. Must last 30 days    Dispense:  60 tablet    Refill:  0    Chronic Pain: STOP Act (Not applicable) Fill 1 day early if closed on refill date. Avoid benzodiazepines within 8 hours of opioids   Orders:  No orders of the defined types were placed in this encounter.  Follow-up plan:   Return in about 3 months (around 11/01/2020) for (F2F), (Med Mgmt).      Interventional management options: Planned, scheduled, and/or pending:   None at this time.   Considering:   Therapeutic bilateral Lumbar facet + bilateral sacroiliac joint RFA #1    Palliative PRN treatment(s):   Palliative left intra-articular hip joint injection #2  Palliative bilateral lumbar facet block #3  Palliative bilateral sacroiliac joint block #2     Recent Visits Date Type Provider Dept  05/04/20 Office Visit Gillis Santa, MD [facility]  Showing recent visits within past 90 days and meeting all other requirements Today's Visits Date Type Provider Dept   07/25/20 Office Visit Milinda Pointer, MD [facility]  Showing today's visits and meeting all other requirements Future Appointments No visits were found meeting these conditions. Showing future appointments within next 90 days and meeting all other requirements  I discussed the assessment and treatment plan with the patient. The patient was provided an opportunity to ask questions and all were answered. The patient agreed with the plan and demonstrated  an understanding of the instructions.  Patient advised to call back or seek an in-person evaluation if the symptoms or condition worsens.  Duration of encounter: 20 minutes.  Note by: Gaspar Cola, MD Date: 07/25/2020; Time: 1:50 PM

## 2020-07-25 NOTE — Patient Instructions (Signed)
____________________________________________________________________________________________  Medication Rules  Purpose: To inform patients, and their family members, of our rules and regulations.  Applies to: All patients receiving prescriptions (written or electronic).  Pharmacy of record: Pharmacy where electronic prescriptions will be sent. If written prescriptions are taken to a different pharmacy, please inform the nursing staff. The pharmacy listed in the electronic medical record should be the one where you would like electronic prescriptions to be sent.  Electronic prescriptions: In compliance with the [place] Strengthen Opioid Misuse Prevention (STOP) Act of 2017 (Session Law 2017-74/H243), effective September 03, 2018, all controlled substances must be electronically prescribed. Calling prescriptions to the pharmacy will cease to exist.  Prescription refills: Only during scheduled appointments. Applies to all prescriptions.  NOTE: The following applies primarily to controlled substances (Opioid* Pain Medications).   Type of encounter (visit): For patients receiving controlled substances, face-to-face visits are required. (Not an option or up to the patient.)  Patient's responsibilities: 1. Pain Pills: Bring all pain pills to every appointment (except for procedure appointments). 2. Pill Bottles: Bring pills in original pharmacy bottle. Always bring the newest bottle. Bring bottle, even if empty. 3. Medication refills: You are responsible for knowing and keeping track of what medications you take and those you need refilled. The day before your appointment: write a list of all prescriptions that need to be refilled. The day of the appointment: give the list to the admitting nurse. Prescriptions will be written only during appointments. No prescriptions will be written on procedure days. If you forget a medication: it will not be "Called in", "Faxed", or "electronically sent".  You will need to get another appointment to get these prescribed. No early refills. Do not call asking to have your prescription filled early. 4. Prescription Accuracy: You are responsible for carefully inspecting your prescriptions before leaving our office. Have the discharge nurse carefully go over each prescription with you, before taking them home. Make sure that your name is accurately spelled, that your address is correct. Check the name and dose of your medication to make sure it is accurate. Check the number of pills, and the written instructions to make sure they are clear and accurate. Make sure that you are given enough medication to last until your next medication refill appointment. 5. Taking Medication: Take medication as prescribed. When it comes to controlled substances, taking less pills or less frequently than prescribed is permitted and encouraged. Never take more pills than instructed. Never take medication more frequently than prescribed.  6. Inform other Doctors: Always inform, all of your healthcare providers, of all the medications you take. 7. Pain Medication from other Providers: You are not allowed to accept any additional pain medication from any other Doctor or Healthcare provider. There are two exceptions to this rule. (see below) In the event that you require additional pain medication, you are responsible for notifying us, as stated below. 8. Medication Agreement: You are responsible for carefully reading and following our Medication Agreement. This must be signed before receiving any prescriptions from our practice. Safely store a copy of your signed Agreement. Violations to the Agreement will result in no further prescriptions. (Additional copies of our Medication Agreement are available upon request.) 9. Laws, Rules, & Regulations: All patients are expected to follow all Federal and State Laws, Statutes, Rules, & Regulations. Ignorance of the Laws does not constitute a  valid excuse.  10. Illegal drugs and Controlled Substances: The use of illegal substances (including, but not limited to marijuana and its   derivatives) and/or the illegal use of any controlled substances is strictly prohibited. Violation of this rule may result in the immediate and permanent discontinuation of any and all prescriptions being written by our practice. The use of any illegal substances is prohibited. 11. Adopted CDC guidelines & recommendations: Target dosing levels will be at or below 60 MME/day. Use of benzodiazepines** is not recommended.  Exceptions: There are only two exceptions to the rule of not receiving pain medications from other Healthcare Providers. 1. Exception #1 (Emergencies): In the event of an emergency (i.e.: accident requiring emergency care), you are allowed to receive additional pain medication. However, you are responsible for: As soon as you are able, call our office [phone], at any time of the day or night, and leave a message stating your name, the date and nature of the emergency, and the name and dose of the medication prescribed. In the event that your call is answered by a member of our staff, make sure to document and save the date, time, and the name of the person that took your information.  2. Exception #2 (Planned Surgery): In the event that you are scheduled by another doctor or dentist to have any type of surgery or procedure, you are allowed (for a period no longer than 30 days), to receive additional pain medication, for the acute post-op pain. However, in this case, you are responsible for picking up a copy of our "Post-op Pain Management for Surgeons" handout, and giving it to your surgeon or dentist. This document is available at our office, and does not require an appointment to obtain it. Simply go to our office during business hours (Monday-Thursday from 8:00 AM to 4:00 PM) (Friday 8:00 AM to 12:00 Noon) or if you have a scheduled appointment  with us, prior to your surgery, and ask for it by name. In addition, you are responsible for: calling our office [phone], at any time of the day or night, and leaving a message stating your name, name of your surgeon, type of surgery, and date of procedure or surgery. Failure to comply with your responsibilities may result in termination of therapy involving the controlled substances.  *Opioid medications include: morphine, codeine, oxycodone, oxymorphone, hydrocodone, hydromorphone, meperidine, tramadol, tapentadol, buprenorphine, fentanyl, methadone. **Benzodiazepine medications include: diazepam (Valium), alprazolam (Xanax), clonazepam (Klonopine), lorazepam (Ativan), clorazepate (Tranxene), chlordiazepoxide (Librium), estazolam (Prosom), oxazepam (Serax), temazepam (Restoril), triazolam (Halcion) (Last updated: 05/10/2020) ____________________________________________________________________________________________   ____________________________________________________________________________________________  Medication Recommendations and Reminders  Applies to: All patients receiving prescriptions (written and/or electronic).  Medication Rules & Regulations: These rules and regulations exist for your safety and that of others. They are not flexible and neither are we. Dismissing or ignoring them will be considered "non-compliance" with medication therapy, resulting in complete and irreversible termination of such therapy. (See document titled "Medication Rules" for more details.) In all conscience, because of safety reasons, we cannot continue providing a therapy where the patient does not follow instructions.  Pharmacy of record:   Definition: This is the pharmacy where your electronic prescriptions will be sent.   We do not endorse any particular pharmacy, however, we have experienced problems with [facility] not securing enough medication supply for the community.  We do not  restrict you in your choice of pharmacy. However, once we write for your prescriptions, we will NOT be re-sending more prescriptions to fix restricted supply problems created by your pharmacy, or your insurance.   The pharmacy listed in the electronic medical record should be the   one where you want electronic prescriptions to be sent.  If you choose to change pharmacy, simply notify our nursing staff.  Recommendations:  Keep all of your pain medications in a safe place, under lock and key, even if you live alone. We will NOT replace lost, stolen, or damaged medication.  After you fill your prescription, take 1 week's worth of pills and put them away in a safe place. You should keep a separate, properly labeled bottle for this purpose. The remainder should be kept in the original bottle. Use this as your primary supply, until it runs out. Once it's gone, then you know that you have 1 week's worth of medicine, and it is time to come in for a prescription refill. If you do this correctly, it is unlikely that you will ever run out of medicine.  To make sure that the above recommendation works, it is very important that you make sure your medication refill appointments are scheduled at least 1 week before you run out of medicine. To do this in an effective manner, make sure that you do not leave the office without scheduling your next medication management appointment. Always ask the nursing staff to show you in your prescription , when your medication will be running out. Then arrange for the receptionist to get you a return appointment, at least 7 days before you run out of medicine. Do not wait until you have 1 or 2 pills left, to come in. This is very poor planning and does not take into consideration that we may need to cancel appointments due to bad weather, sickness, or emergencies affecting our staff.  DO NOT ACCEPT A "Partial Fill": If for any reason your pharmacy does not have enough pills/tablets  to completely fill or refill your prescription, do not allow for a "partial fill". The law allows the pharmacy to complete that prescription within 72 hours, without requiring a new prescription. If they do not fill the rest of your prescription within those 72 hours, you will need a separate prescription to fill the remaining amount, which we will NOT provide. If the reason for the partial fill is your insurance, you will need to talk to the pharmacist about payment alternatives for the remaining tablets, but again, DO NOT ACCEPT A PARTIAL FILL, unless you can trust your pharmacist to obtain the remainder of the pills within 72 hours.  Prescription refills and/or changes in medication(s):   Prescription refills, and/or changes in dose or medication, will be conducted only during scheduled medication management appointments. (Applies to both, written and electronic prescriptions.)  No refills on procedure days. No medication will be changed or started on procedure days. No changes, adjustments, and/or refills will be conducted on a procedure day. Doing so will interfere with the diagnostic portion of the procedure.  No phone refills. No medications will be "called into the pharmacy".  No Fax refills.  No weekend refills.  No Holliday refills.  No after hours refills.  Remember:  Business hours are:  Monday to Thursday 8:00 AM to 4:00 PM Provider's Schedule: Milinda Pointer, MD - Appointments are:  Medication management: Monday and Wednesday 8:00 AM to 4:00 PM Procedure day: Tuesday and Thursday 7:30 AM to 4:00 PM Gillis Santa, MD - Appointments are:  Medication management: Tuesday and Thursday 8:00 AM to 4:00 PM Procedure day: Monday and Wednesday 7:30 AM to 4:00 PM (Last update: 03/23/2020) ____________________________________________________________________________________________   ____________________________________________________________________________________________  CBD  (cannabidiol) WARNING  Applicable to: All individuals currently  taking or considering taking CBD (cannabidiol) and, more important, all patients taking opioid analgesic controlled substances (pain medication). (Example: oxycodone; oxymorphone; hydrocodone; hydromorphone; morphine; methadone; tramadol; tapentadol; fentanyl; buprenorphine; butorphanol; dextromethorphan; meperidine; codeine; etc.)  Legal status: CBD remains a Schedule I drug prohibited for any use. CBD is illegal with one exception. In the [place], CBD has a limited Transport planner (FDA) approval for the treatment of two specific types of epilepsy disorders. Only one CBD product has been approved by the FDA for this purpose: "Epidiolex". FDA is aware that some companies are marketing products containing cannabis and cannabis-derived compounds in ways that violate the [employer], Drug and Cosmetic Act [facility] Act) and that may put the health and safety of consumers at risk. The FDA, a Federal agency, has not enforced the CBD status since 2018.   Legality: Some manufacturers ship CBD products nationally, which is illegal. Often such products are sold online and are therefore available throughout the country. CBD is openly sold in head shops and health food stores in some states where such sales have not been explicitly legalized. Selling unapproved products with unsubstantiated therapeutic claims is not only a violation of the law, but also can put patients at risk, as these products have not been proven to be safe or effective. Federal illegality makes it difficult to conduct research on CBD.  Reference: "FDA Regulation of Cannabis and Cannabis-Derived Products, Including Cannabidiol (CBD)" - [employer]  Warning: CBD is not FDA approved and has not undergo the same manufacturing controls as prescription  drugs.  This means that the purity and safety of available CBD may be questionable. Most of the time, despite manufacturer's claims, it is contaminated with THC (delta-9-tetrahydrocannabinol - the chemical in marijuana responsible for the "HIGH").  When this is the case, the [facility] contaminant will trigger a positive urine drug screen (UDS) test for Marijuana (carboxy-THC). Because a positive UDS for any illicit substance is a violation of our medication agreement, your opioid analgesics (pain medicine) may be permanently discontinued.  MORE ABOUT CBD  General Information: CBD  is a derivative of the Marijuana (cannabis sativa) plant discovered in 64. It is one of the 113 identified substances found in Marijuana. It accounts for up to 40% of the plant's extract. As of 2018, preliminary clinical studies on CBD included research for the treatment of anxiety, movement disorders, and pain. CBD is available and consumed in multiple forms, including inhalation of smoke or vapor, as an aerosol spray, and by mouth. It may be supplied as an oil containing CBD, capsules, dried cannabis, or as a liquid solution. CBD is thought not to be as psychoactive as THC (delta-9-tetrahydrocannabinol - the chemical in marijuana responsible for the "HIGH"). Studies suggest that CBD may interact with different biological target receptors in the body, including cannabinoid and other neurotransmitter receptors. As of 2018 the mechanism of action for its biological effects has not been determined.  Side-effects  Adverse reactions: Dry mouth, diarrhea, decreased appetite, fatigue, drowsiness, malaise, weakness, sleep disturbances, and others.  Drug interactions: CBC may interact with other medications such as blood-thinners. (Last update: 04/09/2020) ____________________________________________________________________________________________

## 2020-08-01 NOTE — Anesthesia Postprocedure Evaluation (Signed)
Anesthesia Post Note  Patient: [name] [name].  Procedure(s) Performed: COLONOSCOPY WITH PROPOFOL (N/A )  Patient location during evaluation: Endoscopy Anesthesia Type: General Level of consciousness: awake and alert and oriented Pain management: pain level controlled Vital Signs Assessment: post-procedure vital signs reviewed and stable Respiratory status: spontaneous breathing Cardiovascular status: blood pressure returned to baseline Anesthetic complications: no   No complications documented.   Last Vitals:  Vitals:   07/20/20 1518 07/20/20 1538  BP:  (!) 152/69  Pulse:    Resp:    Temp: 36.8 C   SpO2:      Last Pain:  Vitals:   07/20/20 1538  TempSrc:   PainSc: 0-No pain                 Demetrias Goodbar

## 2020-10-08 ENCOUNTER — Other Ambulatory Visit: Payer: Self-pay

## 2020-10-08 ENCOUNTER — Inpatient Hospital Stay
Admission: EM | Admit: 2020-10-08 | Discharge: 2020-10-09 | DRG: 378 | Disposition: A | Discharge status: Home / Self Care | Payer: Medicare Other | Attending: Internal Medicine | Admitting: Internal Medicine

## 2020-10-08 DIAGNOSIS — E782 Mixed hyperlipidemia: Secondary | ICD-10-CM | POA: Diagnosis present

## 2020-10-08 DIAGNOSIS — E119 Type 2 diabetes mellitus without complications: Secondary | ICD-10-CM

## 2020-10-08 DIAGNOSIS — R7989 Other specified abnormal findings of blood chemistry: Secondary | ICD-10-CM | POA: Diagnosis present

## 2020-10-08 DIAGNOSIS — Z66 Do not resuscitate: Secondary | ICD-10-CM | POA: Diagnosis not present

## 2020-10-08 DIAGNOSIS — Z20822 Contact with and (suspected) exposure to covid-19: Secondary | ICD-10-CM | POA: Diagnosis present

## 2020-10-08 DIAGNOSIS — I252 Old myocardial infarction: Secondary | ICD-10-CM | POA: Diagnosis not present

## 2020-10-08 DIAGNOSIS — Z8719 Personal history of other diseases of the digestive system: Secondary | ICD-10-CM

## 2020-10-08 DIAGNOSIS — I429 Cardiomyopathy, unspecified: Secondary | ICD-10-CM | POA: Diagnosis present

## 2020-10-08 DIAGNOSIS — E1122 Type 2 diabetes mellitus with diabetic chronic kidney disease: Secondary | ICD-10-CM | POA: Diagnosis not present

## 2020-10-08 DIAGNOSIS — N1832 Chronic kidney disease, stage 3b: Secondary | ICD-10-CM | POA: Diagnosis present

## 2020-10-08 DIAGNOSIS — Z79899 Other long term (current) drug therapy: Secondary | ICD-10-CM

## 2020-10-08 DIAGNOSIS — Z87891 Personal history of nicotine dependence: Secondary | ICD-10-CM | POA: Diagnosis not present

## 2020-10-08 DIAGNOSIS — I129 Hypertensive chronic kidney disease with stage 1 through stage 4 chronic kidney disease, or unspecified chronic kidney disease: Secondary | ICD-10-CM | POA: Diagnosis not present

## 2020-10-08 DIAGNOSIS — E785 Hyperlipidemia, unspecified: Secondary | ICD-10-CM | POA: Diagnosis present

## 2020-10-08 DIAGNOSIS — G4733 Obstructive sleep apnea (adult) (pediatric): Secondary | ICD-10-CM | POA: Diagnosis not present

## 2020-10-08 DIAGNOSIS — I10 Essential (primary) hypertension: Secondary | ICD-10-CM

## 2020-10-08 DIAGNOSIS — F1020 Alcohol dependence, uncomplicated: Secondary | ICD-10-CM | POA: Diagnosis present

## 2020-10-08 DIAGNOSIS — J449 Chronic obstructive pulmonary disease, unspecified: Secondary | ICD-10-CM | POA: Diagnosis not present

## 2020-10-08 DIAGNOSIS — K5791 Diverticulosis of intestine, part unspecified, without perforation or abscess with bleeding: Principal | ICD-10-CM | POA: Diagnosis present

## 2020-10-08 DIAGNOSIS — K648 Other hemorrhoids: Secondary | ICD-10-CM | POA: Diagnosis present

## 2020-10-08 DIAGNOSIS — K760 Fatty (change of) liver, not elsewhere classified: Secondary | ICD-10-CM | POA: Diagnosis present

## 2020-10-08 DIAGNOSIS — Z951 Presence of aortocoronary bypass graft: Secondary | ICD-10-CM | POA: Diagnosis not present

## 2020-10-08 DIAGNOSIS — K922 Gastrointestinal hemorrhage, unspecified: Secondary | ICD-10-CM | POA: Diagnosis present

## 2020-10-08 DIAGNOSIS — K625 Hemorrhage of anus and rectum: Secondary | ICD-10-CM

## 2020-10-08 DIAGNOSIS — M109 Gout, unspecified: Secondary | ICD-10-CM | POA: Diagnosis not present

## 2020-10-08 DIAGNOSIS — Z794 Long term (current) use of insulin: Secondary | ICD-10-CM

## 2020-10-08 DIAGNOSIS — Z7984 Long term (current) use of oral hypoglycemic drugs: Secondary | ICD-10-CM

## 2020-10-08 DIAGNOSIS — Z955 Presence of coronary angioplasty implant and graft: Secondary | ICD-10-CM | POA: Diagnosis not present

## 2020-10-08 DIAGNOSIS — Z7982 Long term (current) use of aspirin: Secondary | ICD-10-CM | POA: Diagnosis not present

## 2020-10-08 DIAGNOSIS — Z8249 Family history of ischemic heart disease and other diseases of the circulatory system: Secondary | ICD-10-CM

## 2020-10-08 DIAGNOSIS — I2510 Atherosclerotic heart disease of native coronary artery without angina pectoris: Secondary | ICD-10-CM | POA: Diagnosis not present

## 2020-10-08 DIAGNOSIS — G894 Chronic pain syndrome: Secondary | ICD-10-CM | POA: Diagnosis present

## 2020-10-08 DIAGNOSIS — N183 Chronic kidney disease, stage 3 unspecified: Secondary | ICD-10-CM | POA: Diagnosis present

## 2020-10-08 LAB — COMPREHENSIVE METABOLIC PANEL
ALT: 83 U/L — ABNORMAL HIGH (ref 0–44)
AST: 74 U/L — ABNORMAL HIGH (ref 15–41)
Albumin: 4.4 g/dL (ref 3.5–5.0)
Alkaline Phosphatase: 77 U/L (ref 38–126)
Anion gap: 12 (ref 5–15)
BUN: 26 mg/dL — ABNORMAL HIGH (ref 8–23)
CO2: 20 mmol/L — ABNORMAL LOW (ref 22–32)
Calcium: 8.8 mg/dL — ABNORMAL LOW (ref 8.9–10.3)
Chloride: 104 mmol/L (ref 98–111)
Creatinine, Ser: 1.31 mg/dL — ABNORMAL HIGH (ref 0.61–1.24)
GFR, Estimated: >60 mL/min (ref >60)
Glucose, Bld: 177 mg/dL — ABNORMAL HIGH (ref 70–99)
Potassium: 3.9 mmol/L (ref 3.5–5.1)
Sodium: 136 mmol/L (ref 135–145)
Total Bilirubin: 1 mg/dL (ref 0.3–1.2)
Total Protein: 7.7 g/dL (ref 6.5–8.1)

## 2020-10-08 LAB — CBC
HCT: 38.1 % — ABNORMAL LOW (ref 39.0–52.0)
Hemoglobin: 13.5 g/dL (ref 13.0–17.0)
MCH: 30.5 pg (ref 26.0–34.0)
MCHC: 35.4 g/dL (ref 30.0–36.0)
MCV: 86 fL (ref 80.0–100.0)
Platelets: 175 10*3/uL (ref 150–400)
RBC: 4.43 MIL/uL (ref 4.22–5.81)
RDW: 12.1 % (ref 11.5–15.5)
WBC: 6.6 10*3/uL (ref 4.0–10.5)
nRBC: 0 % (ref 0.0–0.2)

## 2020-10-08 MED ORDER — PANTOPRAZOLE SODIUM 40 MG IV SOLR
40.0000 mg | Freq: Two times a day (BID) | INTRAVENOUS | Status: DC
  Administered 2020-10-08 – 2020-10-09 (×2): 40 mg via INTRAVENOUS
  Filled 2020-10-08 (×2): qty 40

## 2020-10-08 NOTE — ED Provider Notes (Addendum)
[facility] Emergency Department Provider Note  ____________________________________________   Event Date/Time   First MD Initiated Contact with Patient 10/08/20 2300     (approximate)  I have reviewed the triage vital signs and the nursing notes.   HISTORY  Chief Complaint Rectal Bleeding    HPI [name] Mccrimon. is a 65 y.o. male with history of CAD, hypertension, hyperlipidemia, diabetes who presents to the emergency department rectal bleeding that started tonight.  Reports he had 2 episodes of bright red blood per rectum when he sat on the toilet to urinate.  He states he was passing large amounts of blood without clots and no stool.  He denies any nausea, vomiting, abdominal pain, melena.  He is on aspirin but no anticoagulant.  His last colonoscopy was in November 2021.  He denies previous history of GI bleed and states his colonoscopy was routine.  Patient was told he had internal hemorrhoids and diverticuli.  He denies chest pain, shortness of breath, lightheadedness.        Past Medical History:  Diagnosis Date  . Benign esophageal stricture   . Cardiomyopathy, secondary ([place])   . Coronary artery disease   . Diabetes mellitus without complication ([place])   . Diverticulitis   . GI bleed   . Gout   . Hemorrhoids   . Hyperlipidemia   . Hypertension   . Sleep apnea   . Tubular adenoma of colon     Patient Active Problem List   Diagnosis Date Noted  . Pharmacologic therapy 05/04/2019  . Disorder of skeletal system 05/04/2019  . Problems influencing health status 05/04/2019  . Osteoarthritis of hip (Left) 05/26/2018  . Spondylosis without myelopathy or radiculopathy, lumbosacral region 05/25/2018  . Other specified dorsopathies, sacral and sacrococcygeal region 05/25/2018  . Chronic low back pain (Bilateral) (R>L) 05/25/2018  . DDD (degenerative disc disease), lumbosacral 05/13/2018  . Lumbar facet arthropathy (Multilevel) (Bilateral)  05/13/2018  . Grade 1 Anterolisthesis of L4 on L5 05/13/2018  . Chronic pain syndrome 08/16/2016  . Pneumonia 05/28/2016  . Pressure injury of skin 05/28/2016  . Opioid-induced constipation (OIC) 05/17/2016  . Hypokalemia 05/01/2016  . 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 Carotid atherosclerosis 04/04/2016  . Hypoalbuminemia 04/04/2016  . Right sided weakness 04/02/2016  . S/P CABG (coronary artery bypass graft) 03/30/2016  . Chest pain 02/29/2016  . Unstable angina ([place]) 02/24/2016  . Keloid skin disorder 01/17/2016  . Costochondritis 01/17/2016  . Long term current use of opiate analgesic 12/23/2015  . Long term prescription opiate use 12/23/2015  . Vitamin D insufficiency 11/21/2015  . Lumbar facet syndrome (Bilateral) (R>L) 11/21/2015  . Lumbar spondylosis 11/21/2015  . Elevated sedimentation rate 11/01/2015  . Chronic shoulder pain (3ry area of Pain) (Right) 10/25/2015  . Chronic hip pain (Left) 10/25/2015  . History of alcoholism ([place]) 10/25/2015  . Opiate use 10/25/2015  . Encounter for therapeutic drug level monitoring 10/25/2015  . Chronic chest wall pain (1ry area of Pain) (Incisional Midline) (since 04/22/2012) 10/25/2015  . Chronic lower extremity pain (2ry area of Pain) (Bilateral) (R>L) 10/25/2015  . History of MI (myocardial infarction) (January 2014) 10/25/2015  . Encounter for pain management planning 10/25/2015  . Chronic sacroiliac joint pain (Bilateral) (L>R) 10/25/2015  . Neuropathic pain 10/25/2015  . Neurogenic pain 10/25/2015  . Incisional pain 10/25/2015  . Hyperlipidemia 03/16/2014  . H/O coronary artery bypass surgery 05/12/2012  . Alcohol withdrawal syndrome ([place]) 04/28/2012  . Injury of kidney 04/25/2012  .  OSA (obstructive sleep apnea) 04/22/2012  . Hypertension 04/22/2012  . Coronary artery disease 04/22/2012  . Apnea, sleep 04/21/2012  . Chronic obstructive pulmonary disease ([place]) 04/21/2012  . COPD (chronic obstructive pulmonary  disease) ([place]) 04/21/2012    Past Surgical History:  Procedure Laterality Date  . cardiac bypass    . CARDIAC CATHETERIZATION Left 02/03/2016   Procedure: Left Heart Cath and Coronary Angiography;  Surgeon: Teodoro Spray, MD;  Location: [place] [facility];  Service: Cardiovascular;  Laterality: Left;  . CARDIAC SURGERY    . COLONOSCOPY WITH PROPOFOL N/A 04/29/2015   Procedure: COLONOSCOPY WITH PROPOFOL;  Surgeon: Hulen Luster, MD;  Location: [facility] ENDOSCOPY;  Service: Gastroenterology;  Laterality: N/A;  . COLONOSCOPY WITH PROPOFOL N/A 07/20/2020   Procedure: COLONOSCOPY WITH PROPOFOL;  Surgeon: Toledo, Benay Pike, MD;  Location: [facility] ENDOSCOPY;  Service: Gastroenterology;  Laterality: N/A;  . CORONARY ANGIOPLASTY WITH STENT PLACEMENT     x2  . CORONARY ARTERY BYPASS GRAFT     2013 & 2017  . ESOPHAGOGASTRODUODENOSCOPY (EGD) WITH PROPOFOL N/A 04/29/2015   Procedure: ESOPHAGOGASTRODUODENOSCOPY (EGD) WITH PROPOFOL;  Surgeon: Hulen Luster, MD;  Location: [facility] ENDOSCOPY;  Service: Gastroenterology;  Laterality: N/A;  . SEPTOPLASTY      Prior to Admission medications   Medication Sig Start Date End Date Taking? Authorizing Provider  allopurinol (ZYLOPRIM) 100 MG tablet Take 100 mg by mouth daily.  12/25/18   [provider]  aspirin EC 81 MG tablet Take 81 mg by mouth every morning.    [provider]  atorvastatin (LIPITOR) 40 MG tablet Take 40 mg by mouth at bedtime.     [provider]  B-D UF III MINI PEN NEEDLES 31G X 5 MM MISC U WITH INSULIN PEN INJECTIONS D 12/30/18   [provider]  Cholecalciferol (VITAMIN D3) 2000 units TABS Take 2,000 Units by mouth every morning.    [provider]  gabapentin (NEURONTIN) 600 MG tablet Take 1 tablet (600 mg total) by mouth 2 (two) times daily. 08/03/20 11/01/20  Milinda Pointer, MD  hydrochlorothiazide (HYDRODIURIL) 25 MG tablet Take 25 mg by mouth daily.  07/23/16   [provider]   HYDROcodone-acetaminophen (NORCO/VICODIN) 5-325 MG tablet Take 1 tablet by mouth 2 (two) times daily as needed for severe pain. Must last 30 days 08/03/20 09/02/20  Milinda Pointer, MD  HYDROcodone-acetaminophen (NORCO/VICODIN) 5-325 MG tablet Take 1 tablet by mouth 2 (two) times daily as needed for severe pain. Must last 30 days 09/02/20 10/02/20  Milinda Pointer, MD  HYDROcodone-acetaminophen (NORCO/VICODIN) 5-325 MG tablet Take 1 tablet by mouth 2 (two) times daily as needed for severe pain. Must last 30 days 10/02/20 11/01/20  Milinda Pointer, MD  Insulin Detemir (LEVEMIR FLEXTOUCH) 100 UNIT/ML Pen Inject 46 Units into the skin 2 (two) times daily.     [provider]  isosorbide mononitrate (IMDUR) 30 MG 24 hr tablet Take 30 mg by mouth every morning.     [provider]  linagliptin (TRADJENTA) 5 MG TABS tablet Take 5 mg by mouth daily.    [provider]  losartan (COZAAR) 100 MG tablet take 1 tablet by mouth once daily 08/02/16   [provider]  metoprolol (LOPRESSOR) 50 MG tablet Take 50 mg by mouth 3 (three) times daily.    [provider]  Multiple Vitamin (MULTIVITAMIN WITH MINERALS) TABS tablet Take 1 tablet by mouth every evening.    [provider]  nitroGLYCERIN (NITROSTAT) 0.4 MG SL  tablet Place 0.4 mg under the tongue every 5 (five) minutes x 3 doses as needed for chest pain.  03/07/16   [provider]  OZEMPIC, 0.25 OR 0.5 MG/DOSE, 2 MG/1.5ML SOPN Inject 0.5 mg as directed once a week. 09/23/18   [provider]  Potassium 95 MG TABS Take 99 mg by mouth every morning.     [provider]    Allergies Patient has no known allergies.  Family History  Problem Relation Age of Onset  . Heart disease Mother   . Hypertension Father     Social History Social History   Tobacco Use  . Smoking status: Former Smoker    Years: 25.00  . Smokeless tobacco: Former Network engineer Use Topics  .  Alcohol use: Yes    Comment: occasional  . Drug use: No    Review of Systems Constitutional: No fever. Eyes: No visual changes. ENT: No sore throat. Cardiovascular: Denies chest pain. Respiratory: Denies shortness of breath. Gastrointestinal: No nausea, vomiting, diarrhea. Genitourinary: Negative for dysuria. Musculoskeletal: Negative for back pain. Skin: Negative for rash. Neurological: Negative for focal weakness or numbness.  ____________________________________________   PHYSICAL EXAM:  VITAL SIGNS: ED Triage Vitals  Enc Vitals Group     BP 10/08/20 2149 131/85     Pulse Rate 10/08/20 2149 82     Resp 10/08/20 2149 16     Temp 10/08/20 2149 98.5 F (36.9 C)     Temp Source 10/08/20 2149 Oral     SpO2 10/08/20 2149 100 %     Weight 10/08/20 2151 165 lb (74.8 kg)     Height 10/08/20 2151 5\' 3"  (1.6 m)     Head Circumference --      Peak Flow --      Pain Score 10/08/20 2150 0     Pain Loc --      Pain Edu? --      Excl. in [place]? --    CONSTITUTIONAL: Alert and oriented and responds appropriately to questions. Well-appearing; well-nourished HEAD: Normocephalic EYES: Conjunctivae clear, pupils appear equal, EOM appear intact ENT: normal nose; moist mucous membranes NECK: Supple, normal ROM CARD: RRR; S1 and S2 appreciated; no murmurs, no clicks, no rubs, no gallops RESP: Normal chest excursion without splinting or tachypnea; breath sounds clear and equal bilaterally; no wheezes, no rhonchi, no rales, no hypoxia or respiratory distress, speaking full sentences ABD/GI: Normal bowel sounds; non-distended; soft, non-tender, no rebound, no guarding, no peritoneal signs, no hepatosplenomegaly RECTAL:  Normal rectal tone, gross rectal blood on exam without melena, guaiac positive no hemorrhoids appreciated, nontender rectal exam, no fecal impaction. Chaperone present. BACK: The back appears normal EXT: Normal ROM in all joints; no deformity noted, no edema; no  cyanosis SKIN: Normal color for age and race; warm; no rash on exposed skin NEURO: Moves all extremities equally PSYCH: The patient's mood and manner are appropriate.  ____________________________________________   LABS (all labs ordered are listed, but only abnormal results are displayed)  Labs Reviewed  COMPREHENSIVE METABOLIC PANEL - Abnormal; Notable for the following components:      Result Value   CO2 20 (*)    Glucose, Bld 177 (*)    BUN 26 (*)    Creatinine, Ser 1.31 (*)    Calcium 8.8 (*)    AST 74 (*)    ALT 83 (*)    All other components within normal limits  CBC - Abnormal; Notable for the following components:  HCT 38.1 (*)    All other components within normal limits  SARS CORONAVIRUS 2 BY RT PCR (HOSPITAL ORDER, [place] [facility])  CBC  PROTIME-INR  LACTIC ACID, PLASMA  POC OCCULT BLOOD, ED  TYPE AND SCREEN  TYPE AND SCREEN   ____________________________________________  EKG   EKG Interpretation  Date/Time:  Sunday October 09 2020 01:27:46 EST Ventricular Rate:  81 PR Interval:    QRS Duration: 158 QT Interval:  454 QTC Calculation: 528 R Axis:   -11 Text Interpretation: Sinus rhythm IVCD, consider atypical RBBB No significant change since last tracing Confirmed by Cartina Brousseau ([id number]) on 10/09/2020 1:46:14 AM       ____________________________________________  RADIOLOGY I, Alan Drummer, personally viewed and evaluated these images (plain radiographs) as part of my medical decision making, as well as reviewing the written report by the radiologist.  ED MD interpretation:  none  Official radiology report(s): No results found.  ____________________________________________   PROCEDURES  Procedure(s) performed (including Critical Care):   ____________________________________________   INITIAL IMPRESSION / ASSESSMENT AND PLAN / ED COURSE  As part of my medical decision making, I reviewed the following data within  the electronic MEDICAL RECORD NUMBER Nursing notes reviewed and incorporated, Labs reviewed, Old chart reviewed, Discussed with admitting physician, GI consulted and Notes from prior ED visits         Patient here with rectal bleeding.  Has gross rectal blood on exam that is bright red.  No melena.  Hemodynamically stable with hemoglobin of 13.5, platelet count of 175.  On aspirin but no anticoagulants.  No abdominal pain.  History of internal hemorrhoids and diverticulosis on colonoscopy in November 2021.  Will discuss with GI on-call for further recommendations.     11 :30 PM  Discussed with Dr. Bonna Gains on-call for gastroenterology.  Appreciate her help.  She recommends obtaining a repeat CBC in 2 hours.  She recommends starting patient on Protonix 40 mg IV twice daily with first dose now.  She recommends admission to medicine for observation and GI will consult tomorrow.  She states that if patient has another bloody bowel movement she would recommend getting an RBC scan to see if vascular would need to be involved for embolization.  Patient has been updated with this plan and is currently comfortable.  We will keep n.p.o. at this time.  12:12 AM Discussed patient's case with hospitalist, Dr. Roel Cluck.  I have recommended admission and patient (and family if present) agree with this plan. Admitting physician will place admission orders.   I reviewed all nursing notes, vitals, pertinent previous records and reviewed/interpreted all EKGs, lab and urine results, imaging (as available).    ____________________________________________   FINAL CLINICAL IMPRESSION(S) / ED DIAGNOSES  Final diagnoses:  Rectal bleeding     ED Discharge Orders    None      *Please note:  [name] Shortsleeve. was evaluated in Emergency Department on 10/09/2020 for the symptoms described in the history of present illness. He was evaluated in the context of the global COVID-19 pandemic, which necessitated consideration  that the patient might be at risk for infection with the SARS-CoV-2 virus that causes COVID-19. Institutional protocols and algorithms that pertain to the evaluation of patients at risk for COVID-19 are in a state of rapid change based on information released by regulatory bodies including the CDC and federal and state organizations. These policies and algorithms were followed during the patient's care in the ED.  Some ED  evaluations and interventions may be delayed as a result of limited staffing during and the pandemic.*   Note:  This document was prepared using Dragon voice recognition software and may include unintentional dictation errors.   Marketa Midkiff, Delice Bison, DO 10/09/20 0012    Jakeria Caissie, Delice Bison, DO 10/09/20 [phone]

## 2020-10-08 NOTE — ED Notes (Signed)
Rectal exam by Dr Leonides Schanz, chaperoned by this RN.

## 2020-10-08 NOTE — ED Notes (Signed)
Pt c/o rectal bleeding starting this evening. Pt sts he was sitting on the toilet and noted blood "dripping" into toilet. Pt sts he was not having a bowel movement at that time. Pt describes bright red blood without clots.  Pt reports hx of similar episode with diverticulitis. Denies N/V/D or abdominal pain.

## 2020-10-08 NOTE — H&P (Incomplete)
[name] [name]. [id number] DOB: [birth year] DOA: 10/08/2020     PCP: Ranae Plumber, PA   Outpatient Specialists: * NONE CARDS: * Dr. NEphrology: *  Dr. NEurology *   Dr. Pulmonary *  Dr.  Oncology * Dr. Fabienne Bruns Dr. [facility] Urology Dr. *  Patient arrived to ER on 10/08/20 at 2131 Referred by Attending Ward, Delice Bison, DO   Patient coming from: home Lives alone,   *** With family From facility ***  Chief Complaint: *** Chief Complaint  Patient presents with  . Rectal Bleeding    HPI: [name] [name]. is a 65 y.o. male with medical history significant of , diverticulosis and lower GI bleed, colonic polyps, benign esophageal stricture, cardiomyopathy, CAD, DM2, gout, HLD, HTN, hemorrhoids, OSA    Presented with  BRBPR stared few hours prior to presentation, denies any associated nausea vomiting abdominal pain.  He is on daily aspirin 81 mg He was sitting on a toilet and started to notice blood dripping into the toilet.  He was not having a BM.  Had history in the past of diverticulosis with similar presentation. History of colon polyps.  Infectious risk factors:  Reports*** fever, shortness of breath, dry cough, chest pain, Sore throat, URI symptoms, anosmia/change in taste, N/V/Diarrhea/abdominal pain,  Body aches, severe fatigue Reports known sick contacts, known COVID 19 exposure, inability to completely isolate   ***KNOWN COVID POSITIVE   Has ***NOt been vaccinated against COVID    Initial COVID TEST  NEGATIVE**** POSITIVE,  ***in house  PCR testing  Pending  Lab Results  Component Value Date   [place] NEGATIVE 07/18/2020     Regarding pertinent Chronic problems:     Hyperlipidemia -  on statins Lipitor Lipid Panel     Component Value Date/Time   CHOL 99 02/29/2016 0517   CHOL 181 04/18/2012 0337   TRIG 83 02/29/2016 0517   TRIG 230 (H) 04/18/2012 0337   HDL 30 (L) 02/29/2016 0517   HDL 28 (L) 04/18/2012 0337   CHOLHDL 3.3 02/29/2016 0517    VLDL 17 02/29/2016 0517   VLDL 46 (H) 04/18/2012 0337   LDLCALC 52 02/29/2016 0517   LDLCALC 107 (H) 04/18/2012 0337     HTN on hydrochlorothiazide Imdur Cozaar metoprolol  ***chronic CHF diastolic/systolic/ combined - last echo***  *** CAD  - On Aspirin, statin, betablocker, Plavix                 - *followed by cardiology                - last cardiac cath  The ASCVD Risk score ([place]., et al., 2013) failed to calculate for the following reasons:   Cannot find a previous HDL lab   Cannot find a previous total cholesterol lab    DM 2 -  Lab Results  Component Value Date   HGBA1C 6.1 04/17/2012   on insulin,   ***Hypothyroidism:  Lab Results  Component Value Date   TSH 2.49 04/17/2012   on synthroid  *** Morbid obesity-   BMI Readings from Last 1 Encounters:  10/08/20 29.23 kg/m     *** Asthma -well *** controlled on home inhalers/ nebs f                        ***last no prior***admission  ***  No ***history of intubation  *** COPD - not **followed by pulmonology *** not  on baseline oxygen  *L,    *** OSA -on nocturnal oxygen, *CPAP, *noncompliant with CPAP  *** Hx of CVA - *with/out residual deficits on Aspirin 81 mg, 325, Plavix  ***A. Fib -  - CHA2DS2 vas score **** CHA2DS2/VAS Stroke Risk Points      N/A >= 2 Points: High Risk  1 - 1.99 Points: Medium Risk  0 Points: Low Risk    Last Change: N/A      This score determines the patient's risk of having a stroke if the  patient has atrial fibrillation.      This score is not applicable to this patient. Components are not  calculated.     current  on anticoagulation with ****Coumadin  ***Xarelto,* Eliquis,  *** Not on anticoagulation secondary to Risk of Falls, *** recurrent bleeding         -  Rate control:  Currently controlled with ***Toprolol,  *Metoprolol,* Diltiazem, *Coreg          - Rhythm control: *** amiodarone, *flecainide  ***CKD stage III*- baseline Cr  **** Estimated Creatinine Clearance: 50.3 mL/min (A) (by C-G formula based on SCr of 1.31 mg/dL (H)).  Lab Results  Component Value Date   CREATININE 1.31 (H) 10/08/2020   CREATININE 1.39 (H) 05/07/2019   CREATININE 1.02 06/16/2017     **** Liver disease Computed MELD-Na score unavailable. Necessary lab results were not found in the last year. Computed MELD score unavailable. Necessary lab results were not found in the last year.   ***BPH - on Flomax, Proscar    *** Dementia - on Aricept** Nemenda  *** Chronic anemia - baseline hg Hemoglobin & Hematocrit  Recent Labs    10/08/20 2158  HGB 13.5      While in ER:    ER Provider Called:     DrMarland Kitchen  They Recommend admit to medicine *** Will see in AM*  in ER  Hospitalist was called for admission for ***  The following Work up has been ordered so far:  Orders Placed This Encounter  Procedures  . SARS Coronavirus 2 by RT PCR (hospital order, performed in [facility] hospital lab) Nasopharyngeal Nasopharyngeal Swab  . Comprehensive metabolic panel  . CBC  . CBC  . Diet NPO time specified  . Consult to hospitalist  ALL PATIENTS BEING ADMITTED/HAVING PROCEDURES NEED COVID-19 SCREENING  . POC occult blood, ED  . Type and screen [place]  . Type and screen      Following Medications were ordered in ER: Medications  pantoprazole (PROTONIX) injection 40 mg (has no administration in time range)        Consult Orders  (From admission, onward)         Start     Ordered   10/08/20 2340  Consult to hospitalist  ALL PATIENTS BEING ADMITTED/HAVING PROCEDURES NEED COVID-19 SCREENING  Once       Comments: ALL PATIENTS BEING ADMITTED/HAVING PROCEDURES NEED COVID-19 SCREENING  Provider:  (Not yet assigned)  Question Answer Comment  Place call to: on call   Reason for Consult Admit      10/08/20 2339            Significant initial  Findings: Abnormal Labs Reviewed  COMPREHENSIVE  METABOLIC PANEL - Abnormal; Notable for the following components:      Result Value   CO2 20 (*)    Glucose,  Bld 177 (*)    BUN 26 (*)    Creatinine, Ser 1.31 (*)    Calcium 8.8 (*)    AST 74 (*)    ALT 83 (*)    All other components within normal limits  CBC - Abnormal; Notable for the following components:   HCT 38.1 (*)    All other components within normal limits      Otherwise labs showing:    Recent Labs  Lab 10/08/20 2158  NA 136  K 3.9  CO2 20*  GLUCOSE 177*  BUN 26*  CREATININE 1.31*  CALCIUM 8.8*    Cr  * stable,  Up from baseline see below Lab Results  Component Value Date   CREATININE 1.31 (H) 10/08/2020   CREATININE 1.39 (H) 05/07/2019   CREATININE 1.02 06/16/2017    Recent Labs  Lab 10/08/20 2158  AST 74*  ALT 83*  ALKPHOS 77  BILITOT 1.0  PROT 7.7  ALBUMIN 4.4   Lab Results  Component Value Date   CALCIUM 8.8 (L) 10/08/2020          WBC      Component Value Date/Time   WBC 6.6 10/08/2020 2158   LYMPHSABS 1.2 02/24/2016 1513   LYMPHSABS 1.8 04/21/2012 1316   MONOABS 0.9 02/24/2016 1513   MONOABS 0.5 04/21/2012 1316   EOSABS 0.0 02/24/2016 1513   EOSABS 0.2 04/21/2012 1316   BASOSABS 0.1 02/24/2016 1513   BASOSABS 0.0 04/21/2012 1316        Plt: Lab Results  Component Value Date   PLT 175 10/08/2020     Lactic Acid, Venous    Component Value Date/Time   LATICACIDVEN 1.4 05/28/2016 0757   @ (RESUTFAST[lacticacid:4)@  Procalcitonin *** Ordered   COVID-19 Labs  No results for input(s): DDIMER, FERRITIN, LDH, CRP in the last 72 hours.  Lab Results  Component Value Date   [place] NEGATIVE 07/18/2020      Arterial ***Venous  Blood Gas result:  pH *** pCO2 ***; pO2 ***;     %O2 Sat ***.  ABG    Component Value Date/Time   HCO3 27.9 05/28/2016 0757      HG/HCT * stable,  Down *Up from baseline see below    Component Value Date/Time   HGB 13.5 10/08/2020 2158   HGB 15.4 08/01/2014 2132   HCT  38.1 (L) 10/08/2020 2158   HCT 44.7 08/01/2014 2132   MCV 86.0 10/08/2020 2158   MCV 91 08/01/2014 2132    No results for input(s): LIPASE, AMYLASE in the last 168 hours. No results for input(s): AMMONIA in the last 168 hours.     Troponin ***ordered Cardiac Panel (last 3 results) No results for input(s): CKTOTAL, CKMB, TROPONINI, RELINDX in the last 72 hours.     ECG: Ordered Personally reviewed by me showing: HR : *** Rhythm: *NSR, Sinus tachycardia * A.fib. W RVR, RBBB, LBBB, Paced Ischemic changes*nonspecific changes, no evidence of ischemic changes QTC*   BNP (last 3 results) No results for input(s): BNP in the last 8760 hours.    DM  labs:  HbA1C: No results for input(s): HGBA1C in the last 8760 hours.     CBG (last 3)  No results for input(s): GLUCAP in the last 72 hours.     UA *** no evidence of UTI  ***Pending ***not ordered   Urine analysis:    Component Value Date/Time   COLORURINE YELLOW (A) 05/28/2016 1111   APPEARANCEUR CLEAR (A) 05/28/2016 1111  APPEARANCEUR Clear 05/16/2013 0059   LABSPEC 1.013 05/28/2016 1111   LABSPEC 1.021 05/16/2013 0059   PHURINE 5.0 05/28/2016 1111   GLUCOSEU NEGATIVE 05/28/2016 1111   GLUCOSEU Negative 05/16/2013 0059   HGBUR NEGATIVE 05/28/2016 [place] 05/28/2016 1111   BILIRUBINUR Negative 05/16/2013 0059   KETONESUR NEGATIVE 05/28/2016 1111   PROTEINUR NEGATIVE 05/28/2016 1111   NITRITE NEGATIVE 05/28/2016 1111   LEUKOCYTESUR NEGATIVE 05/28/2016 1111   LEUKOCYTESUR Negative 05/16/2013 0059       Ordered  CT HEAD *** NON acute  CXR - ***NON acute  CTabd/pelvis - ***nonacute  CTA chest - ***nonacute, no PE, * no evidence of infiltrate      ED Triage Vitals  Enc Vitals Group     BP 10/08/20 2149 131/85     Pulse Rate 10/08/20 2149 82     Resp 10/08/20 2149 16     Temp 10/08/20 2149 98.5 F (36.9 C)     Temp Source 10/08/20 2149 Oral     SpO2 10/08/20 2149 100 %     Weight  10/08/20 2151 165 lb (74.8 kg)     Height 10/08/20 2151 5\' 3"  (1.6 m)     Head Circumference --      Peak Flow --      Pain Score 10/08/20 2150 0     Pain Loc --      Pain Edu? --      Excl. in [place]? --   TMAX(24)@       Latest  Blood pressure (!) 148/73, pulse 89, temperature 98.5 F (36.9 C), temperature source Oral, resp. rate 18, height 5\' 3"  (1.6 m), weight 74.8 kg, SpO2 96 %.       Review of Systems:    Pertinent positives include: ***  Constitutional:  No weight loss, night sweats, Fevers, chills, fatigue, weight loss  HEENT:  No headaches, Difficulty swallowing,Tooth/dental problems,Sore throat,  No sneezing, itching, ear ache, nasal congestion, post nasal drip,  Cardio-vascular:  No chest pain, Orthopnea, PND, anasarca, dizziness, palpitations.no Bilateral lower extremity swelling  GI:  No heartburn, indigestion, abdominal pain, nausea, vomiting, diarrhea, change in bowel habits, loss of appetite, melena, blood in stool, hematemesis Resp:  no shortness of breath at rest. No dyspnea on exertion, No excess mucus, no productive cough, No non-productive cough, No coughing up of blood.No change in color of mucus.No wheezing. Skin:  no rash or lesions. No jaundice GU:  no dysuria, change in color of urine, no urgency or frequency. No straining to urinate.  No flank pain.  Musculoskeletal:  No joint pain or no joint swelling. No decreased range of motion. No back pain.  Psych:  No change in mood or affect. No depression or anxiety. No memory loss.  Neuro: no localizing neurological complaints, no tingling, no weakness, no double vision, no gait abnormality, no slurred speech, no confusion  All systems reviewed and apart from [place] all are negative  Past Medical History:   Past Medical History:  Diagnosis Date  . Benign esophageal stricture   . Cardiomyopathy, secondary ([place])   . Coronary artery disease   . Diabetes mellitus without complication ([place])   .  Diverticulitis   . GI bleed   . Gout   . Hemorrhoids   . Hyperlipidemia   . Hypertension   . Sleep apnea   . Tubular adenoma of colon       Past Surgical History:  Procedure Laterality Date  . cardiac bypass    .  CARDIAC CATHETERIZATION Left 02/03/2016   Procedure: Left Heart Cath and Coronary Angiography;  Surgeon: Teodoro Spray, MD;  Location: [place] [facility];  Service: Cardiovascular;  Laterality: Left;  . CARDIAC SURGERY    . COLONOSCOPY WITH PROPOFOL N/A 04/29/2015   Procedure: COLONOSCOPY WITH PROPOFOL;  Surgeon: Hulen Luster, MD;  Location: [facility] ENDOSCOPY;  Service: Gastroenterology;  Laterality: N/A;  . COLONOSCOPY WITH PROPOFOL N/A 07/20/2020   Procedure: COLONOSCOPY WITH PROPOFOL;  Surgeon: [place], Benay Pike, MD;  Location: [facility] ENDOSCOPY;  Service: Gastroenterology;  Laterality: N/A;  . CORONARY ANGIOPLASTY WITH STENT PLACEMENT     x2  . CORONARY ARTERY BYPASS GRAFT     2013 & 2017  . ESOPHAGOGASTRODUODENOSCOPY (EGD) WITH PROPOFOL N/A 04/29/2015   Procedure: ESOPHAGOGASTRODUODENOSCOPY (EGD) WITH PROPOFOL;  Surgeon: Hulen Luster, MD;  Location: [facility] ENDOSCOPY;  Service: Gastroenterology;  Laterality: N/A;  . SEPTOPLASTY      Social History:  Ambulatory *** independently cane, walker  wheelchair bound, bed bound     reports that he has quit smoking. He quit after 25.00 years of use. He has quit using smokeless tobacco. He reports current alcohol use. He reports that he does not use drugs.     Family History: *** Family History  Problem Relation Age of Onset  . Heart disease Mother   . Hypertension Father     Allergies: No Known Allergies   Prior to Admission medications   Medication Sig Start Date End Date Taking? Authorizing Provider  allopurinol (ZYLOPRIM) 100 MG tablet Take 100 mg by mouth daily.  12/25/18   [provider]  aspirin EC 81 MG tablet Take 81 mg by mouth every morning.    [provider]  atorvastatin (LIPITOR) 40 MG tablet  Take 40 mg by mouth at bedtime.     [provider]  B-D UF III MINI PEN NEEDLES 31G X 5 MM MISC U WITH INSULIN PEN INJECTIONS D 12/30/18   [provider]  Cholecalciferol (VITAMIN D3) 2000 units TABS Take 2,000 Units by mouth every morning.    [provider]  gabapentin (NEURONTIN) 600 MG tablet Take 1 tablet (600 mg total) by mouth 2 (two) times daily. 08/03/20 11/01/20  Milinda Pointer, MD  hydrochlorothiazide (HYDRODIURIL) 25 MG tablet Take 25 mg by mouth daily.  07/23/16   [provider]  HYDROcodone-acetaminophen (NORCO/VICODIN) 5-325 MG tablet Take 1 tablet by mouth 2 (two) times daily as needed for severe pain. Must last 30 days 08/03/20 09/02/20  Milinda Pointer, MD  HYDROcodone-acetaminophen (NORCO/VICODIN) 5-325 MG tablet Take 1 tablet by mouth 2 (two) times daily as needed for severe pain. Must last 30 days 09/02/20 10/02/20  Milinda Pointer, MD  HYDROcodone-acetaminophen (NORCO/VICODIN) 5-325 MG tablet Take 1 tablet by mouth 2 (two) times daily as needed for severe pain. Must last 30 days 10/02/20 11/01/20  Milinda Pointer, MD  Insulin Detemir (LEVEMIR FLEXTOUCH) 100 UNIT/ML Pen Inject 46 Units into the skin 2 (two) times daily.     [provider]  isosorbide mononitrate (IMDUR) 30 MG 24 hr tablet Take 30 mg by mouth every morning.     [provider]  linagliptin (TRADJENTA) 5 MG TABS tablet Take 5 mg by mouth daily.    [provider]  losartan (COZAAR) 100 MG tablet take 1 tablet by mouth once daily 08/02/16   [provider]  metoprolol (LOPRESSOR) 50 MG tablet Take 50 mg by mouth 3 (three) times daily.    [provider]  Multiple Vitamin (MULTIVITAMIN WITH MINERALS) TABS tablet Take 1 tablet by mouth every evening.    [provider]  nitroGLYCERIN (NITROSTAT) 0.4 MG SL tablet Place 0.4 mg under the tongue every 5 (five) minutes x 3 doses as needed for chest pain.  03/07/16   [provider]  OZEMPIC, 0.25 OR 0.5 MG/DOSE, 2 MG/1.5ML SOPN Inject 0.5 mg as directed once a week. 09/23/18   [provider]  Potassium 95 MG TABS Take 99 mg by mouth every morning.     [provider]   Physical Exam: Vitals with BMI 10/08/2020 10/08/2020 10/08/2020  Height - 5\' 3"  -  Weight - 165 lbs -  BMI - 123456 -  Systolic 123456 - [id number]  Diastolic 73 - 85  Pulse 89 - 82     1. General:  in No ***Acute distress***increased work of breathing ***complaining of severe pain****agitated * Chronically ill *well *cachectic *toxic acutely ill -appearing 2. Psychological: Alert and *** Oriented 3. Head/ENT:   Moist *** Dry Mucous Membranes                          Head Non traumatic, neck supple                          Normal *** Poor Dentition 4. SKIN: normal *** decreased Skin turgor,  Skin clean Dry and intact no rash 5. Heart: Regular rate and rhythm no*** Murmur, no Rub or gallop 6. Lungs: ***Clear to auscultation bilaterally, no wheezes or crackles   7. Abdomen: Soft, ***non-tender, Non distended *** obese ***bowel sounds present 8. Lower extremities: no clubbing, cyanosis, no ***edema 9. Neurologically Grossly intact, moving all 4 extremities equally *** strength 5 out of 5 in all 4 extremities cranial nerves II through XII intact 10. MSK: Normal range of motion   All other LABS:     Recent Labs  Lab 10/08/20 2158  WBC 6.6  HGB 13.5  HCT 38.1*  MCV 86.0  PLT 175     Recent Labs  Lab 10/08/20 2158  NA 136  K 3.9  CL 104  CO2 20*  GLUCOSE 177*  BUN 26*  CREATININE 1.31*  CALCIUM 8.8*     Recent Labs  Lab 10/08/20 2158  AST 74*  ALT 83*  ALKPHOS 77  BILITOT 1.0  PROT 7.7  ALBUMIN 4.4       Cultures:    Component Value Date/Time   SDES THROAT 09/12/2016 0725   SPECREQUEST NONE 09/12/2016 0725   CULT  09/12/2016 0725    NO GROUP A STREP (S.PYOGENES) ISOLATED Performed at [facility] 09/14/2016 FINAL  09/12/2016 0725     Radiological Exams on Admission: No results found.  Chart has been reviewed    Assessment/Plan  ***  Admitted for ***  Present on Admission: **None**   Other plan as per orders.  DVT prophylaxis:  SCD *** Lovenox       Code Status:    Code Status: Prior FULL CODE *** DNR/DNI ***comfort care as per patient ***family  I had personally discussed CODE STATUS with patient and family* I had spent *min discussing goals of care and CODE STATUS    Family Communication:   Family not at  Bedside  plan of care was discussed on the phone with *** Son, Daughter, Wife, Husband, Sister, Brother , father, mother  Disposition  Plan:   *** likely will need placement for rehabilitation                          Back to current facility when stable                            To home once workup is complete and patient is stable  ***Following barriers for discharge:                            Electrolytes corrected                               Anemia corrected                             Pain controlled with PO medications                               Afebrile, white count improving able to transition to PO antibiotics                             Will need to be able to tolerate PO                            Will likely need home health, home O2, set up                           Will need consultants to evaluate patient prior to discharge  ****EXPECT DC tomorrow                    ***Would benefit from PT/OT eval prior to DC  Ordered                   Swallow eval - SLP ordered                   Diabetes care coordinator                   Transition of care consulted                   Nutrition    consulted                  Wound care  consulted                   Palliative care    consulted                   Behavioral health  consulted                    Consults called: ***    Admission status:  ED Disposition    None       Obs***  ***  inpatient      I Expect 2 midnight stay secondary to severity of patient's current illness need for inpatient interventions justified by the following: ***hemodynamic instability despite optimal treatment (tachycardia *hypotension * tachypnea *hypoxia, hypercapnia) * Severe lab/radiological/exam abnormalities including:  and extensive comorbidities including: *substance abuse  *Chronic pain *DM2  * CHF * CAD  * COPD/asthma *Morbid Obesity * CKD *dementia *liver disease *history of stroke with residual deficits *  malignancy, * sickle cell disease  . History of amputation . Chronic anticoagulation  That are currently affecting medical management.   I expect  patient to be hospitalized for 2 midnights requiring inpatient medical care.  Patient is at high risk for adverse outcome (such as loss of life or disability) if not treated.  Indication for inpatient stay as follows:  Severe change from baseline regarding mental status Hemodynamic instability despite maximal medical therapy,  ongoing suicidal ideations,  severe pain requiring acute inpatient management,  inability to maintain oral hydration   persistent chest pain despite medical management Need for operative/procedural  intervention New or worsening hypoxia  Need for IV antibiotics, IV fluids, IV rate controling medications, IV antihypertensives, IV pain medications, IV anticoagulation    Level of care   *** tele  For 12H 24H     medical floor       SDU tele indefinitely please discontinue once patient no longer qualifies COVID-19 Labs    Lab Results  Component Value Date   [place] NEGATIVE 07/18/2020     Precautions: admitted as *** Covid Negative  ***asymptomatic screening protocol****PUI *** covid positive No active isolations ***If Covid PCR is negative  - please DC precautions - would need additional investigation given very high risk for false native test result   PPE: Used by the provider:   N95***P100   eye Goggles,  Gloves ***gown     Critical***  Patient is critically ill due to  hemodynamic instability * respiratory failure *severe sepsis* ongoing chest pain*  They are at high risk for life/limb threatening clinical deterioration requiring frequent reassessment and modifications of care.  Services provided include examination of the patient, review of relevant ancillary tests, prescription of lifesaving therapies, review of medications and prophylactic therapy.  Total critical care time excluding separately billable procedures: 60*  Minutes.    Lillyrose Reitan 10/08/2020, 11:53 PM ***  [facility]     after 2 AM please page floor coverage PA If 7AM-7PM, please contact the day team taking care of the patient using [email or web address]   Patient was evaluated in the context of the global COVID-19 pandemic, which necessitated consideration that the patient might be at risk for infection with the SARS-CoV-2 virus that causes COVID-19. Institutional protocols and algorithms that pertain to the evaluation of patients at risk for COVID-19 are in a state of rapid change based on information released by regulatory bodies including the CDC and federal and state organizations. These policies and algorithms were followed during the patient's care.

## 2020-10-08 NOTE — H&P (Signed)
[name] [name]. XNA:[id number] DOB: [birth year] DOA: 10/08/2020      PCP: Ranae Plumber, PA   Outpatient Specialists:  CARDS:   Dr. Ubaldo Glassing   GI Dr. Alice Reichert    Patient arrived to ER on 10/08/20 at 2131 Referred by Attending Ward, Delice Bison, DO   Patient coming from: home Lives alone,        Chief Complaint:   Chief Complaint  Patient presents with  . Rectal Bleeding    HPI: [name] [name]. is a 65 y.o. male with medical history significant of , diverticulosis and lower GI bleed, colonic polyps, benign esophageal stricture, cardiomyopathy, CAD, DM2, gout, HLD, HTN, hemorrhoids, OSA, chronic pain syndrome    Presented with  BRBPR stared few hours prior to presentation, denies any associated nausea vomiting abdominal pain.  He is on daily aspirin 81 mg He was sitting on a toilet and started to notice blood dripping into the toilet.  He was not having a BM.  Had history in the past of diverticulosis with similar presentation. History of colon polyps. No history of melena. Last colonoscopy was in November 2021 And was routine. Has history of hemorrhoids and diverticulosis. Otherwise no syncope or lightheadedness no chest pain  Reports a couple of bloody BM's  does not smoke Drinks a couple of shot of liqueur QOD have been drinking for long time Denies hx of withdraw  iInfectious risk factors:  Reports none   Has  been vaccinated against COVID and boosted   Initial COVID TEST   in house  PCR testing  Pending  Lab Results  Component Value Date   [place] NEGATIVE 07/18/2020     Regarding pertinent Chronic problems:     Hyperlipidemia -  on statins Lipitor Lipid Panel     Component Value Date/Time   CHOL 99 02/29/2016 0517   CHOL 181 04/18/2012 0337   TRIG 83 02/29/2016 0517   TRIG 230 (H) 04/18/2012 0337   HDL 30 (L) 02/29/2016 0517   HDL 28 (L) 04/18/2012 0337   CHOLHDL 3.3 02/29/2016 0517   VLDL 17 02/29/2016 0517   VLDL 46 (H) 04/18/2012 0337    LDLCALC 52 02/29/2016 0517   LDLCALC 107 (H) 04/18/2012 0337     HTN on hydrochlorothiazide Imdur Cozaar metoprolol     CAD  - On Aspirin, statin, betablocker,                 -  ollowed by cardiology             Status post CABG 2017   DM 2 -  Lab Results  Component Value Date   HGBA1C 6.1 04/17/2012   on insulin,   Tradjenta      OSA - not on CPAP   CKD stage IIIb - baseline Cr 1.4 Estimated Creatinine Clearance: 50.3 mL/min (A) (by C-G formula based on SCr of 1.31 mg/dL (H)).  Lab Results  Component Value Date   CREATININE 1.31 (H) 10/08/2020   CREATININE 1.39 (H) 05/07/2019   CREATININE 1.02 06/16/2017   While in ER: Normotensive Creatinine 1.3 BUN 26 Slightly elevated LFTs Stable hemoglobin Platelets 175  Gross red blood on rectal exam  ER Provider Called:  GI   Dr.Tahiliani   They Recommend admit to medicine obtaining a repeat CBC in 2 hours.  She recommends starting patient on Protonix 40 mg IV twice daily  if patient has another bloody bowel movement she would recommend getting an RBC scan  to see if vascular would need to be involved for embolization Will see in AM   Hospitalist was called for admission for  Lower Gi bleed  The following Work up has been ordered so far:  Orders Placed This Encounter  Procedures  . SARS Coronavirus 2 by RT PCR (hospital order, performed in [facility] hospital lab) Nasopharyngeal Nasopharyngeal Swab  . Comprehensive metabolic panel  . CBC  . CBC  . Diet NPO time specified  . Consult to hospitalist  ALL PATIENTS BEING ADMITTED/HAVING PROCEDURES NEED COVID-19 SCREENING  . POC occult blood, ED  . Type and screen [place]  . Type and screen    Following Medications were ordered in ER: Medications  pantoprazole (PROTONIX) injection 40 mg (has no administration in time range)        Consult Orders  (From admission, onward)         Start     Ordered   10/08/20 2340  Consult to hospitalist   ALL PATIENTS BEING ADMITTED/HAVING PROCEDURES NEED COVID-19 SCREENING  Once       Comments: ALL PATIENTS BEING ADMITTED/HAVING PROCEDURES NEED COVID-19 SCREENING  Provider:  (Not yet assigned)  Question Answer Comment  Place call to: on call   Reason for Consult Admit      10/08/20 2339           Significant initial  Findings: Abnormal Labs Reviewed  COMPREHENSIVE METABOLIC PANEL - Abnormal; Notable for the following components:      Result Value   CO2 20 (*)    Glucose, Bld 177 (*)    BUN 26 (*)    Creatinine, Ser 1.31 (*)    Calcium 8.8 (*)    AST 74 (*)    ALT 83 (*)    All other components within normal limits  CBC - Abnormal; Notable for the following components:   HCT 38.1 (*)    All other components within normal limits    Otherwise labs showing:    Recent Labs  Lab 10/08/20 2158  NA 136  K 3.9  CO2 20*  GLUCOSE 177*  BUN 26*  CREATININE 1.31*  CALCIUM 8.8*    Cr   Stable  Lab Results  Component Value Date   CREATININE 1.31 (H) 10/08/2020   CREATININE 1.39 (H) 05/07/2019   CREATININE 1.02 06/16/2017    Recent Labs  Lab 10/08/20 2158  AST 74*  ALT 83*  ALKPHOS 77  BILITOT 1.0  PROT 7.7  ALBUMIN 4.4   Lab Results  Component Value Date   CALCIUM 8.8 (L) 10/08/2020     WBC      Component Value Date/Time   WBC 6.6 10/08/2020 2158   LYMPHSABS 1.2 02/24/2016 1513   LYMPHSABS 1.8 04/21/2012 1316   MONOABS 0.9 02/24/2016 1513   MONOABS 0.5 04/21/2012 1316   EOSABS 0.0 02/24/2016 1513   EOSABS 0.2 04/21/2012 1316   BASOSABS 0.1 02/24/2016 1513   BASOSABS 0.0 04/21/2012 1316     Plt: Lab Results  Component Value Date   PLT 175 10/08/2020    Lactic Acid, Venous    Component Value Date/Time   LATICACIDVEN 1.4 05/28/2016 0757        COVID-19 Labs  No results for input(s): DDIMER, FERRITIN, LDH, CRP in the last 72 hours.  Lab Results  Component Value Date   SARSCOV2NAA NEGATIVE 07/18/2020     HG/HCT   Stable     Component  Value Date/Time   HGB  13.5 10/08/2020 2158   HGB 15.4 08/01/2014 2132   HCT 38.1 (L) 10/08/2020 2158   HCT 44.7 08/01/2014 2132   MCV 86.0 10/08/2020 2158   MCV 91 08/01/2014 2132    No results for input(s): LIPASE, AMYLASE in the last 168 hours. No results for input(s): AMMONIA in the last 168 hours.     ECG: Ordered          ED Triage Vitals  Enc Vitals Group     BP 10/08/20 2149 131/85     Pulse Rate 10/08/20 2149 82     Resp 10/08/20 2149 16     Temp 10/08/20 2149 98.5 F (36.9 C)     Temp Source 10/08/20 2149 Oral     SpO2 10/08/20 2149 100 %     Weight 10/08/20 2151 165 lb (74.8 kg)     Height 10/08/20 2151 5\' 3"  (1.6 m)     Head Circumference --      Peak Flow --      Pain Score 10/08/20 2150 0     Pain Loc --      Pain Edu? --      Excl. in [place]? --   TMAX(24)@       Latest  Blood pressure (!) 148/73, pulse 89, temperature 98.5 F (36.9 C), temperature source Oral, resp. rate 18, height 5\' 3"  (1.6 m), weight 74.8 kg, SpO2 96 %.      Review of Systems:    Pertinent positives include:   blood in stool,  Constitutional:  No weight loss, night sweats, Fevers, chills, fatigue, weight loss  HEENT:  No headaches, Difficulty swallowing,Tooth/dental problems,Sore throat,  No sneezing, itching, ear ache, nasal congestion, post nasal drip,  Cardio-vascular:  No chest pain, Orthopnea, PND, anasarca, dizziness, palpitations.no Bilateral lower extremity swelling  GI:  No heartburn, indigestion, abdominal pain, nausea, vomiting, diarrhea, change in bowel habits, loss of appetite, melena,  hematemesis Resp:  no shortness of breath at rest. No dyspnea on exertion, No excess mucus, no productive cough, No non-productive cough, No coughing up of blood.No change in color of mucus.No wheezing. Skin:  no rash or lesions. No jaundice GU:  no dysuria, change in color of urine, no urgency or frequency. No straining to urinate.  No flank pain.  Musculoskeletal:  No joint pain  or no joint swelling. No decreased range of motion. No back pain.  Psych:  No change in mood or affect. No depression or anxiety. No memory loss.  Neuro: no localizing neurological complaints, no tingling, no weakness, no double vision, no gait abnormality, no slurred speech, no confusion  All systems reviewed and apart from [place] all are negative  Past Medical History:   Past Medical History:  Diagnosis Date  . Benign esophageal stricture   . Cardiomyopathy, secondary ([place])   . Coronary artery disease   . Diabetes mellitus without complication ([place])   . Diverticulitis   . GI bleed   . Gout   . Hemorrhoids   . Hyperlipidemia   . Hypertension   . Sleep apnea   . Tubular adenoma of colon       Past Surgical History:  Procedure Laterality Date  . cardiac bypass    . CARDIAC CATHETERIZATION Left 02/03/2016   Procedure: Left Heart Cath and Coronary Angiography;  Surgeon: Teodoro Spray, MD;  Location: [facility];  Service: Cardiovascular;  Laterality: Left;  . CARDIAC SURGERY    . COLONOSCOPY WITH PROPOFOL N/A 04/29/2015  Procedure: COLONOSCOPY WITH PROPOFOL;  Surgeon: Hulen Luster, MD;  Location: [facility] ENDOSCOPY;  Service: Gastroenterology;  Laterality: N/A;  . COLONOSCOPY WITH PROPOFOL N/A 07/20/2020   Procedure: COLONOSCOPY WITH PROPOFOL;  Surgeon: Toledo, Benay Pike, MD;  Location: [facility] ENDOSCOPY;  Service: Gastroenterology;  Laterality: N/A;  . CORONARY ANGIOPLASTY WITH STENT PLACEMENT     x2  . CORONARY ARTERY BYPASS GRAFT     2013 & 2017  . ESOPHAGOGASTRODUODENOSCOPY (EGD) WITH PROPOFOL N/A 04/29/2015   Procedure: ESOPHAGOGASTRODUODENOSCOPY (EGD) WITH PROPOFOL;  Surgeon: Hulen Luster, MD;  Location: [facility] ENDOSCOPY;  Service: Gastroenterology;  Laterality: N/A;  . SEPTOPLASTY      Social History:  Ambulatory   Independently      reports that he has quit smoking. He quit after 25.00 years of use. He has quit using smokeless tobacco. He reports current alcohol use. He  reports that he does not use drugs.    Family History:   Family History  Problem Relation Age of Onset  . Heart disease Mother   . Hypertension Father     Allergies: No Known Allergies   Prior to Admission medications   Medication Sig Start Date End Date Taking? Authorizing Provider  allopurinol (ZYLOPRIM) 100 MG tablet Take 100 mg by mouth daily.  12/25/18   [provider]  aspirin EC 81 MG tablet Take 81 mg by mouth every morning.    [provider]  atorvastatin (LIPITOR) 40 MG tablet Take 40 mg by mouth at bedtime.     [provider]  B-D UF III MINI PEN NEEDLES 31G X 5 MM MISC U WITH INSULIN PEN INJECTIONS D 12/30/18   [provider]  Cholecalciferol (VITAMIN D3) 2000 units TABS Take 2,000 Units by mouth every morning.    [provider]  gabapentin (NEURONTIN) 600 MG tablet Take 1 tablet (600 mg total) by mouth 2 (two) times daily. 08/03/20 11/01/20  Milinda Pointer, MD  hydrochlorothiazide (HYDRODIURIL) 25 MG tablet Take 25 mg by mouth daily.  07/23/16   [provider]  HYDROcodone-acetaminophen (NORCO/VICODIN) 5-325 MG tablet Take 1 tablet by mouth 2 (two) times daily as needed for severe pain. Must last 30 days 08/03/20 09/02/20  Milinda Pointer, MD  HYDROcodone-acetaminophen (NORCO/VICODIN) 5-325 MG tablet Take 1 tablet by mouth 2 (two) times daily as needed for severe pain. Must last 30 days 09/02/20 10/02/20  Milinda Pointer, MD  HYDROcodone-acetaminophen (NORCO/VICODIN) 5-325 MG tablet Take 1 tablet by mouth 2 (two) times daily as needed for severe pain. Must last 30 days 10/02/20 11/01/20  Milinda Pointer, MD  Insulin Detemir (LEVEMIR FLEXTOUCH) 100 UNIT/ML Pen Inject 46 Units into the skin 2 (two) times daily.     [provider]  isosorbide mononitrate (IMDUR) 30 MG 24 hr tablet Take 30 mg by mouth every morning.     [provider]  linagliptin (TRADJENTA) 5 MG TABS tablet Take 5 mg by mouth  daily.    [provider]  losartan (COZAAR) 100 MG tablet take 1 tablet by mouth once daily 08/02/16   [provider]  metoprolol (LOPRESSOR) 50 MG tablet Take 50 mg by mouth 3 (three) times daily.    [provider]  Multiple Vitamin (MULTIVITAMIN WITH MINERALS) TABS tablet Take 1 tablet by mouth every evening.    [provider]  nitroGLYCERIN (NITROSTAT) 0.4 MG SL tablet Place 0.4 mg under the tongue every 5 (five) minutes x 3 doses as needed for chest pain.  03/07/16  [provider]  OZEMPIC, 0.25 OR 0.5 MG/DOSE, 2 MG/1.5ML SOPN Inject 0.5 mg as directed once a week. 09/23/18   [provider]  Potassium 95 MG TABS Take 99 mg by mouth every morning.     [provider]   Physical Exam: Vitals with BMI 10/08/2020 10/08/2020 10/08/2020  Height - 5\' 3"  -  Weight - 165 lbs -  BMI - 123456 -  Systolic 123456 - [id number]  Diastolic 73 - 85  Pulse 89 - 82    1. General:  in No  Acute distress   Chronically ill  -appearing 2. Psychological: Alert and   Oriented 3. Head/ENT:    Dry Mucous Membranes                          Head Non traumatic, neck supple                            Poor Dentition 4. SKIN:  decreased Skin turgor,  Skin clean Dry and intact no rash 5. Heart: Regular rate and rhythm no  Murmur, no Rub or gallop 6. Lungs: Clear to auscultation bilaterally, no wheezes or crackles   7. Abdomen: Soft,  non-tender, Non distended  obese  bowel sounds present 8. Lower extremities: no clubbing, cyanosis, no   edema 9. Neurologically Grossly intact, moving all 4 extremities equally  10. MSK: Normal range of motion Hemoccult positive  All other LABS:     Recent Labs  Lab 10/08/20 2158  WBC 6.6  HGB 13.5  HCT 38.1*  MCV 86.0  PLT 175     Recent Labs  Lab 10/08/20 2158  NA 136  K 3.9  CL 104  CO2 20*  GLUCOSE 177*  BUN 26*  CREATININE 1.31*  CALCIUM 8.8*     Recent Labs  Lab 10/08/20 2158  AST 74*  ALT 83*   ALKPHOS 77  BILITOT 1.0  PROT 7.7  ALBUMIN 4.4       Cultures:    Component Value Date/Time   SDES THROAT 09/12/2016 0725   SPECREQUEST NONE 09/12/2016 0725   CULT  09/12/2016 0725    NO GROUP A STREP (S.PYOGENES) ISOLATED Performed at [facility] 09/14/2016 FINAL 09/12/2016 0725     Radiological Exams on Admission: No results found.  Chart has been reviewed    Assessment/Plan   65 y.o. male with medical history significant of , diverticulosis and lower GI bleed, colonic polyps, benign esophageal stricture, cardiomyopathy, CAD, DM2, gout, HLD, HTN, hemorrhoids, OSA, chronic pain syndrome      Admitted for lower Gi bleed  Present on Admission: . Lower GI bleed - - Suspect Lower Gi source  No hx of PUD,  melena,  BUN elevation to  suggest otherwise  - Admit  For further management given:   Age >60 years,  comorbid illnesses   gross rectal bleeding,  rebleeding   exposure to antiplatelet drugs   -  most likely Diverticular source -   painless bleeding making colitis less likely      -  ER  Provider spoke to gastroenterology they will see patient in a.m. rec starting on protonix appreciate their consult   - serial CBC.    - Monitor for any recurrence,  evidence of hemodynamic instability or significant blood loss -  type and screen,  - Transfuse as needed for hemoglobin below  7 or <9 if evidence of significant  bleeding  - Establish at least 2 PIV and fluid resuscitate   - clear liquids for tonight keep nothing by mouth post midnight,  -  monitor for Recurrent significant  Bleeding of red blood and hemodynamic instability in which case Bleeding scan and IR consult would be indicated.   . OSA (obstructive sleep apnea) - not on CPAP  . Hypertension -allow permissive hypertension we'll hold blood pressure medications For now . Hyperlipidemia -resume statin unable to tolerate   . Coronary artery disease -hold aspirin given Gi bleed   . COPD  (chronic obstructive pulmonary disease) (HCC) -chronic stable  . Chronic pain syndrome -resume home medications when able to tolerate  . CKD (chronic kidney disease), stage III (HCC) -  -chronic avoid nephrotoxic medications such as NSAIDs, Vanco Zosyn combo,  avoid hypotension, continue to follow renal function  . Elevated LFTs -history of hepatic steatosis and alcohol abuse check hepatitis serologies Discussed importance of obstaining from EtOH  Alcohol abuse - denies hx of withdrawal, monitor for any sign of withdrawal Obtain INR  Other plan as per orders.  DVT prophylaxis:  SCD        Code Status:    Code Status: Prior    DNR/DNI  as per patient   I had personally discussed CODE STATUS with patient      Family Communication:   Family not at  Bedside   Disposition Plan:      To home once workup is complete and patient is stable   Following barriers for discharge:                                                                                   Will need to be able to tolerate PO                                                       Will need consultants to evaluate patient prior to discharge                       Consults called:  GI. Dr. Maximino [place]  Is aware  Admission status:  ED Disposition    ED Disposition Condition Comment   Admit  Hospital [facility] [[id number]]  Level of Care: Med-Surg [16]  Covid Evaluation: Asymptomatic Screening Protocol (No Symptoms)  Diagnosis: Lower GI bleed [374827]  Admitting Physician: Therisa Doyne [[id number]]  Attending Physician: Therisa Doyne [[id number]]  Estimated length of stay: past midnight tomorrow  Certification:: I certify this patient will need inpatient services for at least 2 midnights           inpatient     I Expect 2 midnight stay secondary to severity of patient's current illness need for inpatient interventions justified by the following:    Severe lab/radiological/exam abnormalities  including:    GI bleed  and extensive comorbidities including:  Chronic pain  DM2    CAD   COPD/asthma  CKD     That are currently affecting medical management.   I expect  patient to be hospitalized for 2 midnights requiring inpatient medical care.  Patient is at high risk for adverse outcome (such as loss of life or disability) if not treated.  Indication for inpatient stay as follows:    inability to maintain oral hydration    Need for operative/procedural  intervention    Need for , IV fluids, IV PPI    Level of care     tele  For  24H         Lab Results  Component Value Date   [facility] 07/18/2020     Precautions: admitted as   asymptomatic screening protocol    PPE: Used by the provider:   N95  eye Goggles,  Gloves      Kaydynce Pat 10/08/2020, 1:31 AM    [facility]     after 2 AM please page floor coverage PA If 7AM-7PM, please contact the day team taking care of the patient using [email or web address]   Patient was evaluated in the context of the global COVID-19 pandemic, which necessitated consideration that the patient might be at risk for infection with the SARS-CoV-2 virus that causes COVID-19. Institutional protocols and algorithms that pertain to the evaluation of patients at risk for COVID-19 are in a state of rapid change based on information released by regulatory bodies including the CDC and federal and state organizations. These policies and algorithms were followed during the patient's care.

## 2020-10-08 NOTE — ED Triage Notes (Signed)
Pt states has bright red rectal bleeding that began tonight. Pt denies nausea, abd pain, pelvic pain. Pt states takes a baby asa daily, skin pwd. Pt denies rectal pain.

## 2020-10-08 NOTE — ED Notes (Signed)
ED Provider at bedside. 

## 2020-10-09 DIAGNOSIS — E785 Hyperlipidemia, unspecified: Secondary | ICD-10-CM

## 2020-10-09 DIAGNOSIS — Z951 Presence of aortocoronary bypass graft: Secondary | ICD-10-CM | POA: Diagnosis not present

## 2020-10-09 DIAGNOSIS — Z7982 Long term (current) use of aspirin: Secondary | ICD-10-CM | POA: Diagnosis not present

## 2020-10-09 DIAGNOSIS — E119 Type 2 diabetes mellitus without complications: Secondary | ICD-10-CM | POA: Insufficient documentation

## 2020-10-09 DIAGNOSIS — I129 Hypertensive chronic kidney disease with stage 1 through stage 4 chronic kidney disease, or unspecified chronic kidney disease: Secondary | ICD-10-CM | POA: Diagnosis not present

## 2020-10-09 DIAGNOSIS — Z66 Do not resuscitate: Secondary | ICD-10-CM | POA: Diagnosis not present

## 2020-10-09 DIAGNOSIS — R748 Abnormal levels of other serum enzymes: Secondary | ICD-10-CM | POA: Diagnosis not present

## 2020-10-09 DIAGNOSIS — G894 Chronic pain syndrome: Secondary | ICD-10-CM

## 2020-10-09 DIAGNOSIS — R7989 Other specified abnormal findings of blood chemistry: Secondary | ICD-10-CM

## 2020-10-09 DIAGNOSIS — G4733 Obstructive sleep apnea (adult) (pediatric): Secondary | ICD-10-CM

## 2020-10-09 DIAGNOSIS — N182 Chronic kidney disease, stage 2 (mild): Secondary | ICD-10-CM | POA: Insufficient documentation

## 2020-10-09 DIAGNOSIS — F1020 Alcohol dependence, uncomplicated: Secondary | ICD-10-CM | POA: Diagnosis not present

## 2020-10-09 DIAGNOSIS — K922 Gastrointestinal hemorrhage, unspecified: Secondary | ICD-10-CM | POA: Diagnosis not present

## 2020-10-09 DIAGNOSIS — E1122 Type 2 diabetes mellitus with diabetic chronic kidney disease: Secondary | ICD-10-CM | POA: Diagnosis not present

## 2020-10-09 DIAGNOSIS — M109 Gout, unspecified: Secondary | ICD-10-CM | POA: Diagnosis not present

## 2020-10-09 DIAGNOSIS — K648 Other hemorrhoids: Secondary | ICD-10-CM | POA: Diagnosis not present

## 2020-10-09 DIAGNOSIS — Z87891 Personal history of nicotine dependence: Secondary | ICD-10-CM | POA: Diagnosis not present

## 2020-10-09 DIAGNOSIS — Z20822 Contact with and (suspected) exposure to covid-19: Secondary | ICD-10-CM | POA: Diagnosis not present

## 2020-10-09 DIAGNOSIS — Z7984 Long term (current) use of oral hypoglycemic drugs: Secondary | ICD-10-CM | POA: Diagnosis not present

## 2020-10-09 DIAGNOSIS — Z794 Long term (current) use of insulin: Secondary | ICD-10-CM

## 2020-10-09 DIAGNOSIS — Z79899 Other long term (current) drug therapy: Secondary | ICD-10-CM | POA: Diagnosis not present

## 2020-10-09 DIAGNOSIS — J449 Chronic obstructive pulmonary disease, unspecified: Secondary | ICD-10-CM | POA: Diagnosis not present

## 2020-10-09 DIAGNOSIS — I252 Old myocardial infarction: Secondary | ICD-10-CM | POA: Diagnosis not present

## 2020-10-09 DIAGNOSIS — N183 Chronic kidney disease, stage 3 unspecified: Secondary | ICD-10-CM | POA: Diagnosis present

## 2020-10-09 DIAGNOSIS — I10 Essential (primary) hypertension: Secondary | ICD-10-CM

## 2020-10-09 DIAGNOSIS — K625 Hemorrhage of anus and rectum: Secondary | ICD-10-CM | POA: Diagnosis present

## 2020-10-09 DIAGNOSIS — K760 Fatty (change of) liver, not elsewhere classified: Secondary | ICD-10-CM | POA: Diagnosis not present

## 2020-10-09 DIAGNOSIS — I429 Cardiomyopathy, unspecified: Secondary | ICD-10-CM | POA: Diagnosis not present

## 2020-10-09 DIAGNOSIS — I2510 Atherosclerotic heart disease of native coronary artery without angina pectoris: Secondary | ICD-10-CM | POA: Diagnosis not present

## 2020-10-09 DIAGNOSIS — N1832 Chronic kidney disease, stage 3b: Secondary | ICD-10-CM | POA: Diagnosis not present

## 2020-10-09 DIAGNOSIS — Z955 Presence of coronary angioplasty implant and graft: Secondary | ICD-10-CM | POA: Diagnosis not present

## 2020-10-09 DIAGNOSIS — K5791 Diverticulosis of intestine, part unspecified, without perforation or abscess with bleeding: Secondary | ICD-10-CM | POA: Diagnosis not present

## 2020-10-09 LAB — CBG MONITORING, ED
Glucose-Capillary: 217 mg/dL — ABNORMAL HIGH (ref 70–99)
Glucose-Capillary: 124 mg/dL — ABNORMAL HIGH (ref 70–99)
Glucose-Capillary: 229 mg/dL — ABNORMAL HIGH (ref 70–99)

## 2020-10-09 LAB — CBC WITH DIFFERENTIAL/PLATELET
Abs Immature Granulocytes: 0.02 10*3/uL (ref 0.00–0.07)
Basophils Absolute: 0 10*3/uL (ref 0.0–0.1)
Basophils Relative: 0 %
Eosinophils Absolute: 0.1 10*3/uL (ref 0.0–0.5)
Eosinophils Relative: 3 %
HCT: 34.4 % — ABNORMAL LOW (ref 39.0–52.0)
Hemoglobin: 12.2 g/dL — ABNORMAL LOW (ref 13.0–17.0)
Immature Granulocytes: 0 %
Lymphocytes Relative: 29 %
Lymphs Abs: 1.5 10*3/uL (ref 0.7–4.0)
MCH: 30.6 pg (ref 26.0–34.0)
MCHC: 35.5 g/dL (ref 30.0–36.0)
MCV: 86.2 fL (ref 80.0–100.0)
Monocytes Absolute: 0.7 10*3/uL (ref 0.1–1.0)
Monocytes Relative: 13 %
Neutro Abs: 2.9 10*3/uL (ref 1.7–7.7)
Neutrophils Relative %: 55 %
Platelets: 128 10*3/uL — ABNORMAL LOW (ref 150–400)
RBC: 3.99 MIL/uL — ABNORMAL LOW (ref 4.22–5.81)
RDW: 12.2 % (ref 11.5–15.5)
WBC: 5.1 10*3/uL (ref 4.0–10.5)
nRBC: 0 % (ref 0.0–0.2)

## 2020-10-09 LAB — COMPREHENSIVE METABOLIC PANEL
ALT: 67 U/L — ABNORMAL HIGH (ref 0–44)
AST: 54 U/L — ABNORMAL HIGH (ref 15–41)
Albumin: 3.9 g/dL (ref 3.5–5.0)
Alkaline Phosphatase: 81 U/L (ref 38–126)
Anion gap: 11 (ref 5–15)
BUN: 24 mg/dL — ABNORMAL HIGH (ref 8–23)
CO2: 22 mmol/L (ref 22–32)
Calcium: 8.9 mg/dL (ref 8.9–10.3)
Chloride: 105 mmol/L (ref 98–111)
Creatinine, Ser: 1.14 mg/dL (ref 0.61–1.24)
GFR, Estimated: >60 mL/min (ref >60)
Glucose, Bld: 133 mg/dL — ABNORMAL HIGH (ref 70–99)
Potassium: 3.7 mmol/L (ref 3.5–5.1)
Sodium: 138 mmol/L (ref 135–145)
Total Bilirubin: 0.8 mg/dL (ref 0.3–1.2)
Total Protein: 6.6 g/dL (ref 6.5–8.1)

## 2020-10-09 LAB — CBC
HCT: 34.6 % — ABNORMAL LOW (ref 39.0–52.0)
Hemoglobin: 12.3 g/dL — ABNORMAL LOW (ref 13.0–17.0)
MCH: 30.9 pg (ref 26.0–34.0)
MCHC: 35.5 g/dL (ref 30.0–36.0)
MCV: 86.9 fL (ref 80.0–100.0)
Platelets: 143 10*3/uL — ABNORMAL LOW (ref 150–400)
RBC: 3.98 MIL/uL — ABNORMAL LOW (ref 4.22–5.81)
RDW: 12.1 % (ref 11.5–15.5)
WBC: 5.3 10*3/uL (ref 4.0–10.5)
nRBC: 0 % (ref 0.0–0.2)

## 2020-10-09 LAB — ETHANOL: Alcohol, Ethyl (B): <10 mg/dL (ref <10)

## 2020-10-09 LAB — HIV ANTIBODY (ROUTINE TESTING W REFLEX): HIV Screen 4th Generation wRfx: NONREACTIVE

## 2020-10-09 LAB — PROTIME-INR
INR: 1.1 (ref 0.8–1.2)
Prothrombin Time: 13.8 seconds (ref 11.4–15.2)

## 2020-10-09 LAB — HEMOGLOBIN A1C
Hgb A1c MFr Bld: 8 % — ABNORMAL HIGH (ref 4.8–5.6)
Mean Plasma Glucose: 182.9 mg/dL

## 2020-10-09 LAB — SARS CORONAVIRUS 2 BY RT PCR (HOSPITAL ORDER, PERFORMED IN ~~LOC~~ HOSPITAL LAB): SARS Coronavirus 2: NEGATIVE

## 2020-10-09 LAB — LACTIC ACID, PLASMA: Lactic Acid, Venous: 1.9 mmol/L (ref 0.5–1.9)

## 2020-10-09 LAB — MAGNESIUM: Magnesium: 2.2 mg/dL (ref 1.7–2.4)

## 2020-10-09 LAB — PHOSPHORUS: Phosphorus: 3.7 mg/dL (ref 2.5–4.6)

## 2020-10-09 LAB — TSH: TSH: 3.782 u[IU]/mL (ref 0.350–4.500)

## 2020-10-09 MED ORDER — GABAPENTIN 600 MG PO TABS
600.0000 mg | ORAL_TABLET | Freq: Two times a day (BID) | ORAL | Status: DC
  Administered 2020-10-09 (×2): 600 mg via ORAL
  Filled 2020-10-09 (×2): qty 1

## 2020-10-09 MED ORDER — ATORVASTATIN CALCIUM 20 MG PO TABS
40.0000 mg | ORAL_TABLET | Freq: Every day | ORAL | Status: DC
  Administered 2020-10-09: 40 mg via ORAL
  Filled 2020-10-09: qty 2

## 2020-10-09 MED ORDER — METOPROLOL TARTRATE 50 MG PO TABS: 50.0000 mg | ORAL_TABLET | Freq: Two times a day (BID) | ORAL | Status: DC

## 2020-10-09 MED ORDER — PANTOPRAZOLE SODIUM 40 MG PO TBEC: 40.0000 mg | DELAYED_RELEASE_TABLET | Freq: Every day | ORAL | 0 refills | Status: DC

## 2020-10-09 MED ORDER — ACETAMINOPHEN 650 MG RE SUPP: 650.0000 mg | Freq: Four times a day (QID) | RECTAL | Status: DC | PRN

## 2020-10-09 MED ORDER — INSULIN DETEMIR 100 UNIT/ML FLEXPEN: 30.0000 [IU] | PEN_INJECTOR | Freq: Every day | SUBCUTANEOUS | Status: DC

## 2020-10-09 MED ORDER — ALBUTEROL SULFATE (2.5 MG/3ML) 0.083% IN NEBU: 2.5000 mg | INHALATION_SOLUTION | RESPIRATORY_TRACT | Status: DC | PRN

## 2020-10-09 MED ORDER — INSULIN ASPART 100 UNIT/ML ~~LOC~~ SOLN
0.0000 [IU] | SUBCUTANEOUS | Status: DC
  Administered 2020-10-09 (×2): 3 [IU] via SUBCUTANEOUS
  Filled 2020-10-09 (×2): qty 1

## 2020-10-09 MED ORDER — SODIUM CHLORIDE 0.9 % IV SOLN
75.0000 mL/h | INTRAVENOUS | Status: DC
  Administered 2020-10-09: 75 mL/h via INTRAVENOUS

## 2020-10-09 MED ORDER — ACETAMINOPHEN 325 MG PO TABS: 650.0000 mg | ORAL_TABLET | Freq: Four times a day (QID) | ORAL | Status: DC | PRN

## 2020-10-09 MED ORDER — INSULIN DETEMIR 100 UNIT/ML ~~LOC~~ SOLN
30.0000 [IU] | Freq: Every day | SUBCUTANEOUS | Status: DC
  Filled 2020-10-09: qty 0.3

## 2020-10-09 MED ORDER — SODIUM CHLORIDE 0.9 % IV SOLN: INTRAVENOUS | Status: DC

## 2020-10-09 MED ORDER — HYDROCODONE-ACETAMINOPHEN 5-325 MG PO TABS: 1.0000 | ORAL_TABLET | ORAL | Status: DC | PRN

## 2020-10-09 NOTE — ED Notes (Signed)
Pt walked to bathroom, gait steady, pt instructed on use of bathroom call bell if pt needs assistance.

## 2020-10-09 NOTE — Discharge Summary (Signed)
[name] [name] at [place] NAME: [name] [name]    MR#:  [id number]  DATE OF BIRTH:  Nov 01, 1955  DATE OF ADMISSION:  10/08/2020 ADMITTING PHYSICIAN: [name] Grayer, MD  DATE OF DISCHARGE: 10/10/2019  PRIMARY CARE PHYSICIAN: [name] Plumber, PA    ADMISSION DIAGNOSIS:  Lower GI bleed [K92.2]  DISCHARGE DIAGNOSIS:  Active Problems:   OSA (obstructive sleep apnea)   Essential hypertension   Hyperlipidemia   Coronary artery disease   COPD (chronic obstructive pulmonary disease) (HCC)   Chronic pain syndrome   Lower GI bleed   DM2 (diabetes mellitus, type 2) (HCC)   CKD (chronic kidney disease), stage III (HCC)   Elevated LFTs   SECONDARY DIAGNOSIS:   Past Medical History:  Diagnosis Date  . Benign esophageal stricture   . Cardiomyopathy, secondary ([place])   . Coronary artery disease   . Diabetes mellitus without complication ([place])   . Diverticulitis   . GI bleed   . Gout   . Hemorrhoids   . Hyperlipidemia   . Hypertension   . Sleep apnea   . Tubular adenoma of colon     HOSPITAL COURSE:   1.  Lower GI bleed.  Patient had 2 episodes where he had some blood coming out of his rectum.  He came into the hospital for further evaluation.  Hemoglobin stable after 3 hemoglobins.  Last hemoglobin 12.2.  Patient did not have any further bleeding in the hospital.  He had a normal bowel movement with no blood and wanted to go home.  Patient has a follow-up appointment with medical doctor tomorrow.  Will follow up with Dr. Alice [name] as outpatient.  Colonoscopy from 07/2020 reviewed with diverticuli and hemorrhoids.  Most likely a diverticular bleed.  Discontinue aspirin.  No alcohol.  Recommend checking a CBC in a few days.  Return to ER if further bleeding. 2.  Essential hypertension can go back on his medications since hemodynamically stable. 3.  Type 2 diabetes mellitus with chronic kidney disease stage II.  Hemoglobin A1c 8.0.  Continue detemir insulin  and Tradjenta.  Hold Ozempic until seen by PMD 4.  Hyperlipidemia unspecified on statin 5.  Elevated liver function test.  Stop drinking alcohol.  Hepatitis profile sent off 6.  Chronic pain continue chronic pain medications 7.  Obstructive sleep apnea  DISCHARGE CONDITIONS:   Satisfactory  CONSULTS OBTAINED:  Treatment Team:  [name] Manifold, MD  DRUG ALLERGIES:  No Known Allergies  DISCHARGE MEDICATIONS:   Allergies as of 10/09/2020   No Known Allergies     Medication List    STOP taking these medications   aspirin EC 81 MG tablet   hydrochlorothiazide 25 MG tablet Commonly known as: HYDRODIURIL   Ozempic (0.25 or 0.5 MG/DOSE) 2 MG/1.5ML Sopn Generic drug: Semaglutide(0.25 or 0.5MG /DOS)   Potassium 95 MG Tabs   Vitamin D3 50 MCG (2000 UT) Tabs     TAKE these medications   allopurinol 100 MG tablet Commonly known as: ZYLOPRIM Take 100 mg by mouth daily.   atorvastatin 40 MG tablet Commonly known as: LIPITOR Take 40 mg by mouth at bedtime.   B-D UF III MINI PEN NEEDLES 31G X 5 MM Misc Generic drug: Insulin Pen Needle U WITH INSULIN PEN INJECTIONS D   gabapentin 600 MG tablet Commonly known as: NEURONTIN Take 1 tablet (600 mg total) by mouth 2 (two) times daily.   HYDROcodone-acetaminophen 5-325 MG tablet Commonly known as: NORCO/VICODIN Take 1 tablet by  mouth 2 (two) times daily as needed for severe pain. Must last 30 days What changed: Another medication with the same name was removed. Continue taking this medication, and follow the directions you see here.   insulin detemir 100 UNIT/ML FlexPen Commonly known as: LEVEMIR Inject 30 Units into the skin at bedtime. What changed:   how much to take  when to take this   isosorbide mononitrate 30 MG 24 hr tablet Commonly known as: IMDUR Take 30 mg by mouth every morning.   linagliptin 5 MG Tabs tablet Commonly known as: TRADJENTA Take 5 mg by mouth daily.   losartan 100 MG tablet Commonly  known as: COZAAR take 1 tablet by mouth once daily   metoprolol tartrate 50 MG tablet Commonly known as: LOPRESSOR Take 1 tablet (50 mg total) by mouth 2 (two) times daily. What changed: when to take this   multivitamin with minerals Tabs tablet Take 1 tablet by mouth every evening.   nitroGLYCERIN 0.4 MG SL tablet Commonly known as: NITROSTAT Place 0.4 mg under the tongue every 5 (five) minutes x 3 doses as needed for chest pain.   pantoprazole 40 MG tablet Commonly known as: Protonix Take 1 tablet (40 mg total) by mouth daily.        DISCHARGE INSTRUCTIONS:   Follow-up PMD as scheduled Follow-up Dr. Alice [name] 2 weeks  If you experience worsening of your admission symptoms, develop shortness of breath, life threatening emergency, suicidal or homicidal thoughts you must seek medical attention immediately by calling 911 or calling your MD immediately  if symptoms less severe.  You Must read complete instructions/literature along with all the possible adverse reactions/side effects for all the Medicines you take and that have been prescribed to you. Take any new Medicines after you have completely understood and accept all the possible adverse reactions/side effects.   Please note  You were cared for by a hospitalist during your hospital stay. If you have any questions about your discharge medications or the care you received while you were in the hospital after you are discharged, you can call the unit and asked to speak with the hospitalist on call if the hospitalist that took care of you is not available. Once you are discharged, your primary care physician will handle any further medical issues. Please note that NO REFILLS for any discharge medications will be authorized once you are discharged, as it is imperative that you return to your primary care physician (or establish a relationship with a primary care physician if you do not have one) for your aftercare needs so that they can  reassess your need for medications and monitor your lab values.    Today   CHIEF COMPLAINT:   Chief Complaint  Patient presents with  . Rectal Bleeding    HISTORY OF PRESENT ILLNESS:  [name] [name]  is a 65 y.o. male came in with rectal bleeding   VITAL SIGNS:  Blood pressure (!) 163/88, pulse 79, temperature 98.5 F (36.9 C), temperature source Oral, resp. rate 14, height 5\' 3"  (1.6 m), weight 74.8 kg, SpO2 100 %.  I/O:    Intake/Output Summary (Last 24 hours) at 10/09/2020 1500 Last data filed at 10/09/2020 1156 Gross per 24 hour  Intake 600 ml  Output --  Net 600 ml    PHYSICAL EXAMINATION:  GENERAL:  65 y.o.-year-old patient lying in the bed with no acute distress.  EYES: Pupils equal, round, reactive to light and accommodation. No scleral icterus. HEENT: Head atraumatic,  normocephalic. Oropharynx and nasopharynx clear.  LUNGS: Normal breath sounds bilaterally, no wheezing, rales,rhonchi or crepitation. No use of accessory muscles of respiration.  CARDIOVASCULAR: S1, S2 normal. No murmurs, rubs, or gallops.  ABDOMEN: Soft, non-tender, non-distended.  EXTREMITIES: No pedal edema.  NEUROLOGIC: Cranial nerves II through XII are intact. Muscle strength 5/5 in all extremities. Sensation intact. Gait not checked.  PSYCHIATRIC: The patient is alert and oriented x 3.  SKIN: No obvious rash, lesion, or ulcer.   DATA REVIEW:   CBC Recent Labs  Lab 10/09/20 0548  WBC 5.1  HGB 12.2*  HCT 34.4*  PLT 128*    Chemistries  Recent Labs  Lab 10/09/20 0548  NA 138  K 3.7  CL 105  CO2 22  GLUCOSE 133*  BUN 24*  CREATININE 1.14  CALCIUM 8.9  MG 2.2  AST 54*  ALT 67*  ALKPHOS 81  BILITOT 0.8     Microbiology Results  Results for orders placed or performed during the hospital encounter of 10/08/20  SARS Coronavirus 2 by RT PCR (hospital order, performed in [facility] hospital lab) Nasopharyngeal Nasopharyngeal Swab     Status: None   Collection Time: 10/09/20   1:11 AM   Specimen: Nasopharyngeal Swab  Result Value Ref Range Status   SARS Coronavirus 2 NEGATIVE NEGATIVE Final    Comment: (NOTE) SARS-CoV-2 target nucleic acids are NOT DETECTED.  The SARS-CoV-2 RNA is generally detectable in upper and lower respiratory specimens during the acute phase of infection. The lowest concentration of SARS-CoV-2 viral copies this assay can detect is 250 copies / mL. A negative result does not preclude SARS-CoV-2 infection and should not be used as the sole basis for treatment or other patient management decisions.  A negative result may occur with improper specimen collection / handling, submission of specimen other than nasopharyngeal swab, presence of viral mutation(s) within the areas targeted by this assay, and inadequate number of viral copies (<250 copies / mL). A negative result must be combined with clinical observations, patient history, and epidemiological information.  Fact Sheet for Patients:   StrictlyIdeas.no  Fact Sheet for Healthcare Providers: BankingDealers.co.za  This test is not yet approved or  cleared by the [place] FDA and has been authorized for detection and/or diagnosis of SARS-CoV-2 by FDA under an Emergency Use Authorization (EUA).  This EUA will remain in effect (meaning this test can be used) for the duration of the COVID-19 declaration under Section 564(b)(1) of the Act, 21 U.S.C. section 360bbb-3(b)(1), unless the authorization is terminated or revoked sooner.  Performed at [facility], [address] [place], [place] [address]       Management plans discussed with the patient, and he is agreement.  CODE STATUS:     Code Status Orders  (From admission, onward)         Start     Ordered   10/09/20 0129  Do not attempt resuscitation (DNR)  Continuous       Question Answer Comment  In the event of cardiac or respiratory ARREST Do not call a  "code blue"   In the event of cardiac or respiratory ARREST Do not perform Intubation, CPR, defibrillation or ACLS   In the event of cardiac or respiratory ARREST Use medication by any route, position, wound care, and other measures to relive pain and suffering. May use oxygen, suction and manual treatment of airway obstruction as needed for comfort.      10/09/20 0128  Code Status History    Date Active Date Inactive Code Status Order ID Comments User Context   10/09/2020 0121 10/09/2020 0128 Full Code [id number]  Toy Baker, MD ED   10/09/2020 0055 10/09/2020 0121 DNR [id number]  Toy Baker, MD ED   12/04/2016 2008 12/06/2016 1352 Full Code [id number]  Hillary Bow, MD ED   05/28/2016 0922 05/29/2016 1602 Full Code [id number]  Gladstone Lighter, MD Inpatient   02/29/2016 0406 02/29/2016 2008 Full Code [id number]  Saundra Shelling, MD Inpatient   02/24/2016 1754 02/25/2016 1804 Full Code [id number]  Demetrios Loll, MD Inpatient   Advance Care Planning Activity      TOTAL TIME TAKING CARE OF THIS PATIENT: 38 minutes.    [name] [name] M.D on 10/09/2020 at 3:00 PM  Between 7am to 6pm - Pager - [phone]  After 6pm go to [email or web address] - password [email or web address] [place]  [facility]  CC: Primary care physician; [name] [name], [place]

## 2020-10-09 NOTE — ED Notes (Signed)
bg 229  

## 2020-10-09 NOTE — Consult Note (Signed)
Vonda Antigua, MD [address], [place], [place], [place], [address] [address], [place], [place], [place], [address] Phone: [phone]  Fax: [phone]  Consultation  Referring Provider:     Dr. Leonides Schanz Primary Care Physician:  Ranae Plumber, [place] Reason for Consultation:     Rectal bleeding  Date of Admission:  10/08/2020 Date of Consultation:  10/09/2020         HPI:   Luc Shammas. is a 65 y.o. male with previous history of diverticulosis, presents with bright red blood per rectum that started yesterday evening.  Last episode was yesterday night.  No further episodes since then.  No abdominal pain, nausea or vomiting, hematemesis, or melena.  Patient is not on any antithrombotics or anticoagulants.  Reports one prior episode of rectal bleeding years ago.  I have personally reviewed his previous records.  Patient was seen by Dr. Candace Cruise in 2016 for rectal bleeding.  Dr. Colman Cater note states patient had diarrhea with rectal bleeding which started after Cipro and Flagyl.  He underwent EGD and colonoscopy as an outpatient.  Diverticulosis, internal hemorrhoids and small sigmoid colon polyp was seen and removed.  Upper endoscopy was done for dysphagia.  Mild intrinsic esophageal stenosis was seen and dilated.  Exam was otherwise normal.  Patient was also seen by Dr. Vicente Males in 2018 as a follow-up for single episode of colitis/dysentery.  His impression is not was infective colitis and repeat colonoscopy was not recommended at the time.  Please see his note for details.  Patient underwent very recent colonoscopy on July 20, 2020 with Dr. Alice Reichert for history of polyps.  Diverticulosis and internal hemorrhoids were reported.  No polyps were seen.  Past Medical History:  Diagnosis Date  . Benign esophageal stricture   . Cardiomyopathy, secondary ([place])   . Coronary artery disease   . Diabetes mellitus without complication ([place])   . Diverticulitis   . GI bleed   . Gout   . Hemorrhoids    . Hyperlipidemia   . Hypertension   . Sleep apnea   . Tubular adenoma of colon     Past Surgical History:  Procedure Laterality Date  . cardiac bypass    . CARDIAC CATHETERIZATION Left 02/03/2016   Procedure: Left Heart Cath and Coronary Angiography;  Surgeon: Teodoro Spray, MD;  Location: [facility];  Service: Cardiovascular;  Laterality: Left;  . CARDIAC SURGERY    . COLONOSCOPY WITH PROPOFOL N/A 04/29/2015   Procedure: COLONOSCOPY WITH PROPOFOL;  Surgeon: Hulen Luster, MD;  Location[facility] END[facility]COPY;  Service: Gastroenterology;  Laterality: N/A;  . COLONOSCOPY WITH PROPOFOL N/A 07/20/2020   Procedure: COLONOSCOPY WITH PROPOFOL;  Surgeon: Toledo, Benay Pike, MD;  Location: [facility] ENDOSCOPY;  Service: Gastroenterology;  Laterality: N/A;  . CORONARY ANGIOPLASTY WITH STENT PLACEMENT     x2  . CORONARY ARTERY BYPASS GRAFT     2013 & 2017  . ESOPHAGOGASTRODUODENOSCOPY (EGD) WITH PROPOFOL N/A 04/29/2015   Procedure: ESOPHAGOGASTRODUODENOSCOPY (EGD) WITH PROPOFOL;  Surgeon: Hulen Luster, MD;  Location: [facility] ENDOSCOPY;  Service: Gastroenterology;  Laterality: N/A;  . SEPTOPLASTY      Prior to Admission medications   Medication Sig Start Date End Date Taking? Authorizing Provider  allopurinol (ZYLOPRIM) 100 MG tablet Take 100 mg by mouth daily.  12/25/18  Yes [provider]  aspirin EC 81 MG tablet Take 81 mg by mouth every morning.   Yes [provider]  atorvastatin (LIPITOR) 40 MG tablet  Take 40 mg by mouth at bedtime.    Yes [provider]  Cholecalciferol (VITAMIN D3) 2000 units TABS Take 2,000 Units by mouth every morning.   Yes [provider]  gabapentin (NEURONTIN) 600 MG tablet Take 1 tablet (600 mg total) by mouth 2 (two) times daily. 08/03/20 11/01/20 Yes Milinda Pointer, MD  hydrochlorothiazide (HYDRODIURIL) 25 MG tablet Take 25 mg by mouth daily.  07/23/16  Yes [provider]  HYDROcodone-acetaminophen (NORCO/VICODIN) 5-325 MG  tablet Take 1 tablet by mouth 2 (two) times daily as needed for severe pain. Must last 30 days 10/02/20 11/01/20 Yes Milinda Pointer, MD  insulin detemir (LEVEMIR) 100 UNIT/ML FlexPen Inject 46 Units into the skin 2 (two) times daily.    Yes [provider]  isosorbide mononitrate (IMDUR) 30 MG 24 hr tablet Take 30 mg by mouth every morning.    Yes [provider]  linagliptin (TRADJENTA) 5 MG TABS tablet Take 5 mg by mouth daily.   Yes [provider]  losartan (COZAAR) 100 MG tablet take 1 tablet by mouth once daily 08/02/16  Yes [provider]  metoprolol (LOPRESSOR) 50 MG tablet Take 50 mg by mouth 3 (three) times daily.   Yes [provider]  Multiple Vitamin (MULTIVITAMIN WITH MINERALS) TABS tablet Take 1 tablet by mouth every evening.   Yes [provider]  nitroGLYCERIN (NITROSTAT) 0.4 MG SL tablet Place 0.4 mg under the tongue every 5 (five) minutes x 3 doses as needed for chest pain.  03/07/16  Yes [provider]  OZEMPIC, 0.25 OR 0.5 MG/DOSE, 2 MG/1.5ML SOPN Inject 0.5 mg as directed once a week. 09/23/18  Yes [provider]  Potassium 95 MG TABS Take 99 mg by mouth every morning.    Yes [provider]  B-D UF III MINI PEN NEEDLES 31G X 5 MM MISC U WITH INSULIN PEN INJECTIONS D 12/30/18   [provider]  HYDROcodone-acetaminophen (NORCO/VICODIN) 5-325 MG tablet Take 1 tablet by mouth 2 (two) times daily as needed for severe pain. Must last 30 days 08/03/20 09/02/20  Milinda Pointer, MD  HYDROcodone-acetaminophen (NORCO/VICODIN) 5-325 MG tablet Take 1 tablet by mouth 2 (two) times daily as needed for severe pain. Must last 30 days 09/02/20 10/02/20  Milinda Pointer, MD    Family History  Problem Relation Age of Onset  . Heart disease Mother   . Hypertension Father      Social History   Tobacco Use  . Smoking status: Former Smoker    Years: 25.00  . Smokeless tobacco: Former [employer] Use Topics  . Alcohol use: Yes    Comment: occasional  . Drug use: No    Allergies as of 10/08/2020  . (No Known Allergies)    Review of Systems:    All systems reviewed and negative except where noted in HPI.   Physical Exam:  Vital signs in last 24 hours: Vitals:   10/09/20 0610 10/09/20 0721 10/09/20 1053 10/09/20 1101  BP: (!) 154/74 (!) 164/81 (!) 163/88 (!) 163/88  Pulse: 78 80  79  Resp:  16 (!) 21 14  Temp:      TempSrc:      SpO2: 98% 99%  100%  Weight:      Height:         General:   Pleasant, cooperative in NAD Head:  Normocephalic and atraumatic. Eyes:   No icterus.   Conjunctiva pink. PERRLA. Ears:  Normal auditory acuity. Neck:  Supple; no masses or thyroidomegaly Lungs: Respirations even and unlabored. Lungs clear to auscultation bilaterally.   No wheezes, crackles, or rhonchi.  Abdomen:  Soft, nondistended, nontender. Normal bowel sounds. No appreciable masses or hepatomegaly.  No rebound or guarding.  Neurologic:  Alert and oriented x3;  grossly normal neurologically. Skin:  Intact without significant lesions or rashes. Cervical Nodes:  No significant cervical adenopathy. Psych:  Alert and cooperative. Normal affect.  LAB RESULTS: Recent Labs    10/08/20 2158 10/09/20 0111 10/09/20 0548  WBC 6.6 5.3 5.1  HGB 13.5 12.3* 12.2*  HCT 38.1* 34.6* 34.4*  PLT 175 143* 128*   BMET Recent Labs    10/08/20 2158 10/09/20 0548  NA 136 138  K 3.9 3.7  CL 104 105  CO2 20* 22  GLUCOSE 177* 133*  BUN 26* 24*  CREATININE 1.31* 1.14  CALCIUM 8.8* 8.9   LFT Recent Labs    10/09/20 0548  PROT 6.6  ALBUMIN 3.9  AST 54*  ALT 67*  ALKPHOS 81  BILITOT 0.8   PT/INR Recent Labs    10/09/20 0111  LABPROT 13.8  INR 1.1    STUDIES: No results found.    Impression / Plan:   Egidio Lofgren. is a 65 y.o. y/o male with bright red blood per rectum that occurred yesterday evening and has not reoccurred since late last night, over  12 hours symptom-free at this time  Given patient's extensive diverticulosis noted on his recent colonoscopy, likely source is diverticular bleeding which seems to have resolved at this time  Patient is hemodynamically stable, no hematemesis, no melena, no signs of upper GI bleed at this time  Suspicion of malignancy is low given very recent colonoscopy with no polyps found  If rebleeding reoccurs, consider colonoscopy versus RBC scan  Continue monitoring clinical symptoms and CBC closely  Please page GI with any signs of active GI bleeding  Patient has chronic mild elevation in transaminases.  He was advised against alcohol use.  Acute hepatitis panel is pending.  Patient was advised to follow-up in GI clinic (primary GI Dr. Alice Reichert) closely after discharge to ensure proper follow-up and work-up for this and he verbalized understanding  Avoid hepatotoxic drugs  Dr. Vicente Males to follow from tomorrow  Thank you for involving me in the care of this patient.      LOS: 0 days   Virgel Manifold, MD  10/09/2020, 11:44 AM

## 2020-10-09 NOTE — Discharge Instructions (Addendum)
Lower Gastrointestinal Bleeding  Lower gastrointestinal (GI) bleeding is the result of bleeding from the colon, the rectum, or the area of the opening of the buttocks (anal area). The colon is the last part of the digestive tract, where stool (feces) is formed. A person with lower GI bleeding may see blood in or on his or her stool. The blood may be bright red. Lower GI bleeding often stops without treatment. Continued or heavy bleeding may be life-threatening and needs emergency treatment at a hospital. What are the causes? This condition may be caused by:  Diverticulosis. This condition causes pouches to form in your colon over time.  Diverticulitis. This is inflammation in areas where diverticulosis has occurred.  Inflammatory bowel disease (IBD), or inflammation of the colon.  Hemorrhoids, or swollen veins in the rectum.  Anal fissures, or painful tears in the anus. Tears are often caused by passing hard stools.  Cancer of the colon or rectum, or polyps of the colon or rectum. Polyps are noncancerous growths.  Coagulopathy. This is a disorder that makes it hard to form blood clots and causes easy bleeding.  Arteriovenous malformation. This is an abnormal weakening of a blood vessel where an artery and a vein come together. What increases the risk? The following factors may make you more likely to develop this condition:  Being older than age 53.  Regular use of aspirin or NSAIDs, such as ibuprofen.  Taking blood thinners (anticoagulants) or anti-platelet medicines.  Having a history of high-dose X-ray treatment (radiation therapy) of the colon.  Having recently had a colon polyp removed.  Heavy use of alcohol, or use of nicotine products. What are the signs or symptoms? Symptoms of this condition include:  Bright red blood or blood clots coming from your rectum.  Bloody stools.  Black or maroon-colored stools.  Pain or cramping in your abdomen.  Weakness or  dizziness.  Racing heartbeat. How is this diagnosed? This condition may be diagnosed based on:  Your symptoms and medical history.  A physical exam. During the exam, your health care provider will check for signs of blood loss, such as low blood pressure and a fast pulse.  Tests or procedures. These may include: ? Flexible sigmoidoscopy. In this procedure, a flexible tube with a camera on the end is used to examine your anus and the first part of your colon to look for the source of bleeding. ? Colonoscopy. This is similar to a flexible sigmoidoscopy, but the camera can extend all the way to the uppermost part of your colon. ? Blood tests to measure your red blood cell count and to check for coagulopathy. ? Angiogram. For this test, X-rays of your colon are taken after a dye or radioactive substance is injected into your bloodstream. This may be done to look for a bleeding site. How is this treated? Treatment for this condition depends on the cause of your bleeding. Heavy or ongoing bleeding is treated at a hospital. Treatment may include:  Getting fluids through an IV inserted into one of your veins.  Getting blood through an IV (blood transfusion).  Stopping bleeding with high heat (coagulation), injections of certain medicines, or surgical clips. This can all be done during a colonoscopy.  Having a procedure that involves first doing an angiogram and then blocking blood flow to the bleeding site (embolization).  Stopping some of your regular medicines for a certain amount of time.  Having surgery to remove part of your colon. This may be needed  if bleeding is severe and does not lessen after other treatment. Follow these instructions at home: Eating and drinking  Eat foods that are high in fiber. This will help keep your stools soft. These foods include whole grains, legumes, fruits, and vegetables. Eating 1-3 prunes a day works well for many people.  Drink enough fluid to keep  your urine pale yellow.  Do not drink alcohol.   General instructions  Do not use any products that contain nicotine or tobacco, such as cigarettes, e-cigarettes, and chewing tobacco. If you need help quitting, ask your health care provider.  Take over-the-counter and prescription medicines only as told by your health care provider. You may need to avoid aspirin, NSAIDs, or other medicines that increase bleeding.  Return to your normal activities as told by your health care provider. Ask your health care provider what activities are safe for you.  Keep all follow-up visits as told by your health care provider. This is important.   Contact a health care provider if:  Your symptoms do not improve.  You feel nauseous, or you vomit.  You have pain in your abdomen.  You feel weak or dizzy.  You need help to stop smoking or drinking alcohol. Get help right away if:  Your bleeding increases.  You have severe weakness or you faint.  You have sudden or severe cramps in your back or abdomen.  You vomit blood.  You pass large blood clots in your stool.  Any of your other symptoms get worse. These symptoms may represent a serious problem that is an emergency. Do not wait to see if the symptoms will go away. Get medical help right away. Call your local emergency services (911 in the [place] Do not drive yourself to the hospital. Summary  If you have lower gastrointestinal (GI) bleeding, you may see blood in or on your stool (feces). The blood may be bright red.  Take over-the-counter and prescription medicines only as told by your health care provider. You may need to avoid aspirin, NSAIDs, or other medicines that increase bleeding.  Keep all follow-up visits as told by your health care provider. This is important.  Get help right away if your symptoms get worse. This information is not intended to replace advice given to you by your health care provider. Make sure you discuss any  questions you have with your health care provider. Document Revised: 08/15/2019 Document Reviewed: 08/15/2019 Elsevier Patient Education  2021 [employer].

## 2020-10-09 NOTE — ED Notes (Signed)
Pt refuses to keep oxygen probe on finger

## 2020-10-09 NOTE — ED Notes (Signed)
Pt states he drove himself and feels comfortable driving home. Pt walking to bathroom, gait steady.

## 2020-10-10 DIAGNOSIS — Z8719 Personal history of other diseases of the digestive system: Secondary | ICD-10-CM | POA: Insufficient documentation

## 2020-10-10 LAB — HEPATITIS PANEL, ACUTE
HCV Ab: NONREACTIVE
Hep A IgM: NONREACTIVE
Hep B C IgM: NONREACTIVE
Hepatitis B Surface Ag: NONREACTIVE

## 2020-10-12 LAB — TYPE AND SCREEN
ABO/RH(D): O POS
Antibody Screen: NEGATIVE
Unit division: 0
Unit division: 0

## 2020-10-12 LAB — BPAM RBC
Blood Product Expiration Date: 202203042359
Blood Product Expiration Date: 202203042359
Unit Type and Rh: 5100
Unit Type and Rh: 5100

## 2020-10-30 NOTE — Progress Notes (Signed)
PROVIDER NOTE: Information contained herein reflects review and annotations entered in association with encounter. Interpretation of such information and data should be left to medically-trained personnel. Information provided to patient can be located elsewhere in the medical record under "Patient Instructions". Document created using STT-dictation technology, any transcriptional errors that may result from process are unintentional.    Patient: [name] [name].  Service Category: E/M  Provider: Gaspar Cola, MD  DOB: 1956/02/13  DOS: 10/31/2020  Specialty: Interventional Pain Management  MRN: [id number]  Setting: Ambulatory outpatient  PCP: Ranae Plumber, PA  Type: Established Patient    Referring Provider: Ranae Plumber, PA  Location: Office  Delivery: Face-to-face     HPI  [name], a 65 y.o. year old male, is here today because of his Chronic pain syndrome [G89.4]. [name] primary complain today is Leg Pain (Right leg) Last encounter: My last encounter with him was on 07/25/2020. Pertinent problems: [name] has Chronic shoulder pain (3ry area of Pain) (Right); Chronic hip pain (Left); Chronic chest wall pain (1ry area of Pain) (Incisional Midline) (since 04/22/2012); Chronic lower extremity pain (2ry area of Pain) (Bilateral) (R>L); Chronic sacroiliac joint pain (Bilateral) (L>R); Neuropathic pain; Neurogenic pain; Incisional pain; Lumbar facet syndrome (Bilateral) (R>L); Lumbar spondylosis; Keloid skin disorder; Costochondritis; Brachial plexus injury, right; Right sided weakness; Chronic pain syndrome; DDD (degenerative disc disease), lumbosacral; Lumbar facet arthropathy (Multilevel) (Bilateral); Grade 1 Anterolisthesis of L4 on L5; Spondylosis without myelopathy or radiculopathy, lumbosacral region; Other specified dorsopathies, sacral and sacrococcygeal region; Chronic low back pain (Bilateral) (R>L); and Osteoarthritis of hip (Left) on their pertinent problem  list. Pain Assessment: Severity of Chronic pain is reported as a 3 /10. Location: Leg Right/Pain radiaties down his right leg to his ankle. Onset: More than a month ago. Quality: Aching,Throbbing,Numbness,Constant. Timing: Constant. Modifying factor(s): meds and sitting in a reclinger. Vitals:  height is '5\' 3"'  (1.6 m) and weight is 165 lb (74.8 kg). His temporal temperature is 97.5 F (36.4 C) (abnormal). His blood pressure is 127/62 and his pulse is 86. His respiration is 16.   Reason for encounter: medication management.   The patient indicates doing well with the current medication regimen. No adverse reactions or side effects reported to the medications.   Pharmacotherapy Assessment   Analgesic: Hydrocodone/APAP 5/325 mg, 1 tab PO BID (10 mg/day of hydrocodone) MME/day: 10 mg/day.   Monitoring: [place] PMP: PDMP reviewed during this encounter.       Pharmacotherapy: No side-effects or adverse reactions reported. Compliance: No problems identified. Effectiveness: Clinically acceptable.  Chauncey Fischer, RN  10/31/2020  2:43 PM  Sign when Signing Visit Nursing Pain Medication Assessment:  Safety precautions to be maintained throughout the outpatient stay will include: orient to surroundings, keep bed in low position, maintain call bell within reach at all times, provide assistance with transfer out of bed and ambulation.  Medication Inspection Compliance: Pill count conducted under aseptic conditions, in front of the patient. Neither the pills nor the bottle was removed from the patient's sight at any time. Once count was completed pills were immediately returned to the patient in their original bottle.  Medication: Hydrocodone/APAP Pill/Patch Count: 3 of 60 pills remain Pill/Patch Appearance: Markings consistent with prescribed medication Bottle Appearance: Standard pharmacy container. Clearly labeled. Filled Date: 1 / 30 / 22 Last Medication intake:  TodaySafety precautions to be maintained  throughout the outpatient stay will include: orient to surroundings, keep bed in low position, maintain call bell  within reach at all times, provide assistance with transfer out of bed and ambulation.     UDS:  Summary  Date Value Ref Range Status  05/10/2020 Note  Final    Comment:    ==================================================================== [employer] (MW) ==================================================================== Test                             Result       Flag       Units  Drug Present and Declared for Prescription Verification   Hydrocodone                    1131         EXPECTED   ng/mg creat   Hydromorphone                  311          EXPECTED   ng/mg creat   Dihydrocodeine                 89           EXPECTED   ng/mg creat   Norhydrocodone                 525          EXPECTED   ng/mg creat    Sources of hydrocodone include scheduled prescription medications.    Hydromorphone, dihydrocodeine and norhydrocodone are expected    metabolites of hydrocodone. Hydromorphone and dihydrocodeine are    also available as scheduled prescription medications.  ==================================================================== Test                      Result    Flag   Units      Ref Range   Creatinine              192              mg/dL      >=20 ==================================================================== Declared Medications:  The flagging and interpretation on this report are based on the  following declared medications.  Unexpected results may arise from  inaccuracies in the declared medications.   **Note: The testing scope of this panel includes these medications:   Hydrocodone (Norco)   **Note: The testing scope of this panel does not include the  following reported medications:   Acetaminophen (Norco)  Allopurinol (Zyloprim)  Aspirin  Atorvastatin (Lipitor)  Gabapentin (Neurontin)  Hydrochlorothiazide (Hydrodiuril)  Insulin  (Levemir)  Isosorbide (Imdur)  Linagliptin (Tradjenta)  Losartan (Cozaar)  Metoprolol (Lopressor)  Multivitamin  Nitroglycerin (Nitrostat)  Potassium  Semaglutide (Ozempic)  Vitamin D3 ==================================================================== For clinical consultation, please call [phone]. ====================================================================      ROS  Constitutional: Denies any fever or chills Gastrointestinal: No reported hemesis, hematochezia, vomiting, or acute GI distress Musculoskeletal: Denies any acute onset joint swelling, redness, loss of ROM, or weakness Neurological: No reported episodes of acute onset apraxia, aphasia, dysarthria, agnosia, amnesia, paralysis, loss of coordination, or loss of consciousness  Medication Review  HYDROcodone-acetaminophen, Insulin Pen Needle, allopurinol, atorvastatin, gabapentin, insulin detemir, isosorbide mononitrate, linagliptin, losartan, metoprolol tartrate, multivitamin with minerals, nitroGLYCERIN, and pantoprazole  History Review  Allergy: [name] has No Known Allergies. Drug: [name]  reports no history of drug use. Alcohol:  reports current alcohol use. Tobacco:  reports that he has quit smoking. He quit after 25.00 years of use. He has quit using smokeless tobacco.  Social: Mr. Giammarco  reports that he has quit smoking. He quit after 25.00 years of use. He has quit using smokeless tobacco. He reports current alcohol use. He reports that he does not use drugs. Medical:  has a past medical history of Benign esophageal stricture, Cardiomyopathy, secondary ([place]), Coronary artery disease, Diabetes mellitus without complication ([place]), Diverticulitis, GI bleed, Gout, Hemorrhoids, Hyperlipidemia, Hypertension, Sleep apnea, and Tubular adenoma of colon. Surgical: Mr. Broom  has a past surgical history that includes Coronary angioplasty with stent; cardiac bypass; Cardiac surgery; Colonoscopy with  propofol (N/A, 04/29/2015); Esophagogastroduodenoscopy (egd) with propofol (N/A, 04/29/2015); Septoplasty; Cardiac catheterization (Left, 02/03/2016); Coronary artery bypass graft; and Colonoscopy with propofol (N/A, 07/20/2020). Family: family history includes Heart disease in his mother; Hypertension in his father.  Laboratory Chemistry Profile   Renal Lab Results  Component Value Date   BUN 24 (H) 10/09/2020   CREATININE 1.14 10/09/2020   GFRAA >60 05/07/2019   GFRNONAA >60 10/09/2020     Hepatic Lab Results  Component Value Date   AST 54 (H) 10/09/2020   ALT 67 (H) 10/09/2020   ALBUMIN 3.9 10/09/2020   ALKPHOS 81 10/09/2020   HCVAB NON REACTIVE 10/09/2020   LIPASE 29 03/19/2015     Electrolytes Lab Results  Component Value Date   NA 138 10/09/2020   K 3.7 10/09/2020   CL 105 10/09/2020   CALCIUM 8.9 10/09/2020   MG 2.2 10/09/2020   PHOS 3.7 10/09/2020     Bone Lab Results  Component Value Date   VD25OH 74.7 05/07/2019   VD125OH2TOT 30.8 10/26/2015     Inflammation (CRP: Acute Phase) (ESR: Chronic Phase) Lab Results  Component Value Date   CRP 0.9 05/07/2019   ESRSEDRATE 41 (H) 05/07/2019   LATICACIDVEN 1.9 10/09/2020       Note: Above Lab results reviewed.  Recent Imaging Review  DG Shoulder Left CLINICAL DATA:  Left shoulder pain over the last few days which increases with movement. Tender to palpation. Initial encounter.  EXAM: LEFT SHOULDER - 2+ VIEW  COMPARISON:  None.  FINDINGS: The left shoulder is located. No acute abnormality is present. Calcification anterior to the proximal humerus on the axillary view may be within the tendon. Visualized hemithorax is clear.  IMPRESSION: 1. No acute abnormality. 2. Question calcific tendinitis along the deltoid.  Electronically Signed   By: San Morelle M.D.   On: 02/12/2019 07:48 Note: Reviewed        Physical Exam  General appearance: Well nourished, well developed, and well hydrated. In  no apparent acute distress Mental status: Alert, oriented x 3 (person, place, & time)       Respiratory: No evidence of acute respiratory distress Eyes: PERLA Vitals: BP 127/62 (BP Location: Left Arm, Patient Position: Sitting, Cuff Size: Large)   Pulse 86   Temp (!) 97.5 F (36.4 C) (Temporal)   Resp 16   Ht '5\' 3"'  (1.6 m)   Wt 165 lb (74.8 kg)   BMI 29.23 kg/m  BMI: Estimated body mass index is 29.23 kg/m as calculated from the following:   Height as of this encounter: '5\' 3"'  (1.6 m).   Weight as of this encounter: 165 lb (74.8 kg). Ideal: Ideal body weight: 56.9 kg (125 lb 7.1 oz) Adjusted ideal body weight: 64.1 kg (141 lb 4.2 oz)  Assessment   Status Diagnosis  Controlled Controlled Controlled 1. Chronic pain syndrome   2. Chronic chest wall pain (Primary Area of Pain) (Incisional Midline) (since 04/22/2012)  3. Chronic lower extremity pain (Secondary area of Pain) (Bilateral) (R>L)   4. Chronic shoulder pain (Third area of Pain) (Right)   5. Pharmacologic therapy   6. Uncomplicated opioid dependence ([place])      Updated Problems: Problem  Uncomplicated Opioid Dependence ([facility])    Plan of Care  Problem-specific:  No problem-specific Assessment & Plan notes found for this encounter.  [name] has a current medication list which includes the following long-term medication(s): allopurinol, atorvastatin, gabapentin, insulin detemir, isosorbide mononitrate, linagliptin, metoprolol tartrate, pantoprazole, [START ON 11/01/2020] hydrocodone-acetaminophen, [START ON 12/01/2020] hydrocodone-acetaminophen, and [START ON 12/31/2020] hydrocodone-acetaminophen.  Pharmacotherapy (Medications Ordered): Meds ordered this encounter  Medications  . HYDROcodone-acetaminophen (NORCO/VICODIN) 5-325 MG tablet    Sig: Take 1 tablet by mouth 2 (two) times daily as needed for severe pain. Must last 30 days    Dispense:  60 tablet    Refill:  0    Chronic Pain: STOP Act (Not  applicable) Fill 1 day early if closed on refill date. Avoid benzodiazepines within 8 hours of opioids  . HYDROcodone-acetaminophen (NORCO/VICODIN) 5-325 MG tablet    Sig: Take 1 tablet by mouth 2 (two) times daily as needed for severe pain. Must last 30 days    Dispense:  60 tablet    Refill:  0    Chronic Pain: STOP Act (Not applicable) Fill 1 day early if closed on refill date. Avoid benzodiazepines within 8 hours of opioids  . HYDROcodone-acetaminophen (NORCO/VICODIN) 5-325 MG tablet    Sig: Take 1 tablet by mouth 2 (two) times daily as needed for severe pain. Must last 30 days    Dispense:  60 tablet    Refill:  0    Chronic Pain: STOP Act (Not applicable) Fill 1 day early if closed on refill date. Avoid benzodiazepines within 8 hours of opioids   Orders:  No orders of the defined types were placed in this encounter.  Follow-up plan:   Return in about 13 weeks (around 01/30/2021) for (F2F), (Med Mgmt).      Interventional management options: Planned, scheduled, and/or pending:   None at this time.   Considering:   Therapeutic bilateral Lumbar facet + bilateral sacroiliac joint RFA #1    Palliative PRN treatment(s):   Palliative left intra-articular hip joint injection #2  Palliative bilateral lumbar facet block #3  Palliative bilateral sacroiliac joint block #2      Recent Visits No visits were found meeting these conditions. Showing recent visits within past 90 days and meeting all other requirements Today's Visits Date Type Provider Dept  10/31/20 Office Visit Milinda Pointer, MD [facility]  Showing today's visits and meeting all other requirements Future Appointments No visits were found meeting these conditions. Showing future appointments within next 90 days and meeting all other requirements  I discussed the assessment and treatment plan with the patient. The patient was provided an opportunity to ask questions and all were answered. The patient agreed  with the plan and demonstrated an understanding of the instructions.  Patient advised to call back or seek an in-person evaluation if the symptoms or condition worsens.  Duration of encounter: 30 minutes.  Note by: Gaspar Cola, MD Date: 10/31/2020; Time: 3:19 PM

## 2020-10-31 ENCOUNTER — Other Ambulatory Visit: Payer: Self-pay

## 2020-10-31 ENCOUNTER — Encounter: Payer: Self-pay | Admitting: Pain Medicine

## 2020-10-31 ENCOUNTER — Ambulatory Visit: Payer: Medicare Other | Attending: Pain Medicine | Admitting: Pain Medicine

## 2020-10-31 VITALS — BP 127/62 | HR 86 | Temp 97.5°F | Resp 16 | Ht 63.0 in | Wt 165.0 lb

## 2020-10-31 DIAGNOSIS — M79604 Pain in right leg: Secondary | ICD-10-CM | POA: Insufficient documentation

## 2020-10-31 DIAGNOSIS — F1120 Opioid dependence, uncomplicated: Secondary | ICD-10-CM | POA: Diagnosis present

## 2020-10-31 DIAGNOSIS — M25511 Pain in right shoulder: Secondary | ICD-10-CM | POA: Insufficient documentation

## 2020-10-31 DIAGNOSIS — G894 Chronic pain syndrome: Secondary | ICD-10-CM | POA: Diagnosis not present

## 2020-10-31 DIAGNOSIS — Z79899 Other long term (current) drug therapy: Secondary | ICD-10-CM | POA: Diagnosis present

## 2020-10-31 DIAGNOSIS — R0789 Other chest pain: Secondary | ICD-10-CM | POA: Diagnosis not present

## 2020-10-31 DIAGNOSIS — M79605 Pain in left leg: Secondary | ICD-10-CM | POA: Insufficient documentation

## 2020-10-31 DIAGNOSIS — G8929 Other chronic pain: Secondary | ICD-10-CM | POA: Insufficient documentation

## 2020-10-31 MED ORDER — HYDROCODONE-ACETAMINOPHEN 5-325 MG PO TABS: 1.0000 | ORAL_TABLET | Freq: Two times a day (BID) | ORAL | 0 refills | Status: DC | PRN

## 2020-10-31 NOTE — Progress Notes (Signed)
Nursing Pain Medication Assessment:  Safety precautions to be maintained throughout the outpatient stay will include: orient to surroundings, keep bed in low position, maintain call bell within reach at all times, provide assistance with transfer out of bed and ambulation.  Medication Inspection Compliance: Pill count conducted under aseptic conditions, in front of the patient. Neither the pills nor the bottle was removed from the patient's sight at any time. Once count was completed pills were immediately returned to the patient in their original bottle.  Medication: Hydrocodone/APAP Pill/Patch Count: 3 of 60 pills remain Pill/Patch Appearance: Markings consistent with prescribed medication Bottle Appearance: Standard pharmacy container. Clearly labeled. Filled Date: 1 / 30 / 22 Last Medication intake:  TodaySafety precautions to be maintained throughout the outpatient stay will include: orient to surroundings, keep bed in low position, maintain call bell within reach at all times, provide assistance with transfer out of bed and ambulation.

## 2020-10-31 NOTE — Patient Instructions (Addendum)
____________________________________________________________________________________________  Medication Rules  Purpose: To inform patients, and their family members, of our rules and regulations.  Applies to: All patients receiving prescriptions (written or electronic).  Pharmacy of record: Pharmacy where electronic prescriptions will be sent. If written prescriptions are taken to a different pharmacy, please inform the nursing staff. The pharmacy listed in the electronic medical record should be the one where you would like electronic prescriptions to be sent.  Electronic prescriptions: In compliance with the [place] Strengthen Opioid Misuse Prevention (STOP) Act of 2017 (Session Law 2017-74/H243), effective September 03, 2018, all controlled substances must be electronically prescribed. Calling prescriptions to the pharmacy will cease to exist.  Prescription refills: Only during scheduled appointments. Applies to all prescriptions.  NOTE: The following applies primarily to controlled substances (Opioid* Pain Medications).   Type of encounter (visit): For patients receiving controlled substances, face-to-face visits are required. (Not an option or up to the patient.)  Patient's responsibilities: 1. Pain Pills: Bring all pain pills to every appointment (except for procedure appointments). 2. Pill Bottles: Bring pills in original pharmacy bottle. Always bring the newest bottle. Bring bottle, even if empty. 3. Medication refills: You are responsible for knowing and keeping track of what medications you take and those you need refilled. The day before your appointment: write a list of all prescriptions that need to be refilled. The day of the appointment: give the list to the admitting nurse. Prescriptions will be written only during appointments. No prescriptions will be written on procedure days. If you forget a medication: it will not be "Called in", "Faxed", or "electronically sent".  You will need to get another appointment to get these prescribed. No early refills. Do not call asking to have your prescription filled early. 4. Prescription Accuracy: You are responsible for carefully inspecting your prescriptions before leaving our office. Have the discharge nurse carefully go over each prescription with you, before taking them home. Make sure that your name is accurately spelled, that your address is correct. Check the name and dose of your medication to make sure it is accurate. Check the number of pills, and the written instructions to make sure they are clear and accurate. Make sure that you are given enough medication to last until your next medication refill appointment. 5. Taking Medication: Take medication as prescribed. When it comes to controlled substances, taking less pills or less frequently than prescribed is permitted and encouraged. Never take more pills than instructed. Never take medication more frequently than prescribed.  6. Inform other Doctors: Always inform, all of your healthcare providers, of all the medications you take. 7. Pain Medication from other Providers: You are not allowed to accept any additional pain medication from any other Doctor or Healthcare provider. There are two exceptions to this rule. (see below) In the event that you require additional pain medication, you are responsible for notifying us, as stated below. 8. Cough Medicine: Often these contain an opioid, such as codeine or hydrocodone. Never accept or take cough medicine containing these opioids if you are already taking an opioid* medication. The combination may cause respiratory failure and death. 9. Medication Agreement: You are responsible for carefully reading and following our Medication Agreement. This must be signed before receiving any prescriptions from our practice. Safely store a copy of your signed Agreement. Violations to the Agreement will result in no further prescriptions.  (Additional copies of our Medication Agreement are available upon request.) 10. Laws, Rules, & Regulations: All patients are expected to follow all   Federal and State Laws, Statutes, Rules, & Regulations. Ignorance of the Laws does not constitute a valid excuse.  11. Illegal drugs and Controlled Substances: The use of illegal substances (including, but not limited to marijuana and its derivatives) and/or the illegal use of any controlled substances is strictly prohibited. Violation of this rule may result in the immediate and permanent discontinuation of any and all prescriptions being written by our practice. The use of any illegal substances is prohibited. 12. Adopted CDC guidelines & recommendations: Target dosing levels will be at or below 60 MME/day. Use of benzodiazepines** is not recommended.  Exceptions: There are only two exceptions to the rule of not receiving pain medications from other Healthcare Providers. 1. Exception #1 (Emergencies): In the event of an emergency (i.e.: accident requiring emergency care), you are allowed to receive additional pain medication. However, you are responsible for: As soon as you are able, call our office [phone], at any time of the day or night, and leave a message stating your name, the date and nature of the emergency, and the name and dose of the medication prescribed. In the event that your call is answered by a member of our staff, make sure to document and save the date, time, and the name of the person that took your information.  2. Exception #2 (Planned Surgery): In the event that you are scheduled by another doctor or dentist to have any type of surgery or procedure, you are allowed (for a period no longer than 30 days), to receive additional pain medication, for the acute post-op pain. However, in this case, you are responsible for picking up a copy of our "Post-op Pain Management for Surgeons" handout, and giving it to your surgeon or dentist. This  document is available at our office, and does not require an appointment to obtain it. Simply go to our office during business hours (Monday-Thursday from 8:00 AM to 4:00 PM) (Friday 8:00 AM to 12:00 Noon) or if you have a scheduled appointment with us, prior to your surgery, and ask for it by name. In addition, you are responsible for: calling our office [phone], at any time of the day or night, and leaving a message stating your name, name of your surgeon, type of surgery, and date of procedure or surgery. Failure to comply with your responsibilities may result in termination of therapy involving the controlled substances.  *Opioid medications include: morphine, codeine, oxycodone, oxymorphone, hydrocodone, hydromorphone, meperidine, tramadol, tapentadol, buprenorphine, fentanyl, methadone. **Benzodiazepine medications include: diazepam (Valium), alprazolam (Xanax), clonazepam (Klonopine), lorazepam (Ativan), clorazepate (Tranxene), chlordiazepoxide (Librium), estazolam (Prosom), oxazepam (Serax), temazepam (Restoril), triazolam (Halcion) (Last updated: 08/01/2020) ____________________________________________________________________________________________   ____________________________________________________________________________________________  Medication Recommendations and Reminders  Applies to: All patients receiving prescriptions (written and/or electronic).  Medication Rules & Regulations: These rules and regulations exist for your safety and that of others. They are not flexible and neither are we. Dismissing or ignoring them will be considered "non-compliance" with medication therapy, resulting in complete and irreversible termination of such therapy. (See document titled "Medication Rules" for more details.) In all conscience, because of safety reasons, we cannot continue providing a therapy where the patient does not follow instructions.  Pharmacy of record:   Definition:  This is the pharmacy where your electronic prescriptions will be sent.   We do not endorse any particular pharmacy, however, we have experienced problems with [facility] not securing enough medication supply for the community.  We do not restrict you in your choice of pharmacy. However,   once we write for your prescriptions, we will NOT be re-sending more prescriptions to fix restricted supply problems created by your pharmacy, or your insurance.   The pharmacy listed in the electronic medical record should be the one where you want electronic prescriptions to be sent.  If you choose to change pharmacy, simply notify our nursing staff.  Recommendations:  Keep all of your pain medications in a safe place, under lock and key, even if you live alone. We will NOT replace lost, stolen, or damaged medication.  After you fill your prescription, take 1 week's worth of pills and put them away in a safe place. You should keep a separate, properly labeled bottle for this purpose. The remainder should be kept in the original bottle. Use this as your primary supply, until it runs out. Once it's gone, then you know that you have 1 week's worth of medicine, and it is time to come in for a prescription refill. If you do this correctly, it is unlikely that you will ever run out of medicine.  To make sure that the above recommendation works, it is very important that you make sure your medication refill appointments are scheduled at least 1 week before you run out of medicine. To do this in an effective manner, make sure that you do not leave the office without scheduling your next medication management appointment. Always ask the nursing staff to show you in your prescription , when your medication will be running out. Then arrange for the receptionist to get you a return appointment, at least 7 days before you run out of medicine. Do not wait until you have 1 or 2 pills left, to come in. This is very poor planning and  does not take into consideration that we may need to cancel appointments due to bad weather, sickness, or emergencies affecting our staff.  DO NOT ACCEPT A "Partial Fill": If for any reason your pharmacy does not have enough pills/tablets to completely fill or refill your prescription, do not allow for a "partial fill". The law allows the pharmacy to complete that prescription within 72 hours, without requiring a new prescription. If they do not fill the rest of your prescription within those 72 hours, you will need a separate prescription to fill the remaining amount, which we will NOT provide. If the reason for the partial fill is your insurance, you will need to talk to the pharmacist about payment alternatives for the remaining tablets, but again, DO NOT ACCEPT A PARTIAL FILL, unless you can trust your pharmacist to obtain the remainder of the pills within 72 hours.  Prescription refills and/or changes in medication(s):   Prescription refills, and/or changes in dose or medication, will be conducted only during scheduled medication management appointments. (Applies to both, written and electronic prescriptions.)  No refills on procedure days. No medication will be changed or started on procedure days. No changes, adjustments, and/or refills will be conducted on a procedure day. Doing so will interfere with the diagnostic portion of the procedure.  No phone refills. No medications will be "called into the pharmacy".  No Fax refills.  No weekend refills.  No Holliday refills.  No after hours refills.  Remember:  Business hours are:  Monday to Thursday 8:00 AM to 4:00 PM Provider's Schedule: Francisco Naveira, MD - Appointments are:  Medication management: Monday and Wednesday 8:00 AM to 4:00 PM Procedure day: Tuesday and Thursday 7:30 AM to 4:00 PM Bilal Lateef, MD - Appointments are:    Medication management: Tuesday and Thursday 8:00 AM to 4:00 PM Procedure day: Monday and Wednesday  7:30 AM to 4:00 PM (Last update: 03/23/2020) ____________________________________________________________________________________________   ____________________________________________________________________________________________  CBD (cannabidiol) WARNING  Applicable to: All individuals currently taking or considering taking CBD (cannabidiol) and, more important, all patients taking opioid analgesic controlled substances (pain medication). (Example: oxycodone; oxymorphone; hydrocodone; hydromorphone; morphine; methadone; tramadol; tapentadol; fentanyl; buprenorphine; butorphanol; dextromethorphan; meperidine; codeine; etc.)  Legal status: CBD remains a Schedule I drug prohibited for any use. CBD is illegal with one exception. In the [place], CBD has a limited Transport planner (FDA) approval for the treatment of two specific types of epilepsy disorders. Only one CBD product has been approved by the FDA for this purpose: "Epidiolex". FDA is aware that some companies are marketing products containing cannabis and cannabis-derived compounds in ways that violate the [employer], Drug and Cosmetic Act [facility] Act) and that may put the health and safety of consumers at risk. The FDA, a Federal agency, has not enforced the CBD status since 2018.   Legality: Some manufacturers ship CBD products nationally, which is illegal. Often such products are sold online and are therefore available throughout the country. CBD is openly sold in head shops and health food stores in some states where such sales have not been explicitly legalized. Selling unapproved products with unsubstantiated therapeutic claims is not only a violation of the law, but also can put patients at risk, as these products have not been proven to be safe or effective. Federal illegality makes it difficult to conduct research on CBD.  Reference: "FDA Regulation of Cannabis and Cannabis-Derived Products, Including Cannabidiol  (CBD)" - [employer]  Warning: CBD is not FDA approved and has not undergo the same manufacturing controls as prescription drugs.  This means that the purity and safety of available CBD may be questionable. Most of the time, despite manufacturer's claims, it is contaminated with THC (delta-9-tetrahydrocannabinol - the chemical in marijuana responsible for the "HIGH").  When this is the case, the [facility] contaminant will trigger a positive urine drug screen (UDS) test for Marijuana (carboxy-THC). Because a positive UDS for any illicit substance is a violation of our medication agreement, your opioid analgesics (pain medicine) may be permanently discontinued.  MORE ABOUT CBD  General Information: CBD  is a derivative of the Marijuana (cannabis sativa) plant discovered in 26. It is one of the 113 identified substances found in Marijuana. It accounts for up to 40% of the plant's extract. As of 2018, preliminary clinical studies on CBD included research for the treatment of anxiety, movement disorders, and pain. CBD is available and consumed in multiple forms, including inhalation of smoke or vapor, as an aerosol spray, and by mouth. It may be supplied as an oil containing CBD, capsules, dried cannabis, or as a liquid solution. CBD is thought not to be as psychoactive as THC (delta-9-tetrahydrocannabinol - the chemical in marijuana responsible for the "HIGH"). Studies suggest that CBD may interact with different biological target receptors in the body, including cannabinoid and other neurotransmitter receptors. As of 2018 the mechanism of action for its biological effects has not been determined.  Side-effects  Adverse reactions: Dry mouth, diarrhea, decreased appetite, fatigue, drowsiness, malaise, weakness, sleep disturbances, and others.  Drug interactions: CBC may interact with other  medications such as blood-thinners. (Last update: 04/09/2020) ____________________________________________________________________________________________    ______________________________________________________________________________________________  Specialty Pain Scale  Introduction:  There are significant differences in how pain is reported. The word pain usually refers  to physical pain, but it is also a common synonym of suffering. The medical community uses a scale from 0 (zero) to 10 (ten) to report pain level. Zero (0) is described as "no pain", while ten (10) is described as "the worse pain you can imagine". The problem with this scale is that physical pain is reported along with suffering. Suffering refers to mental pain, or more often yet it refers to any unpleasant feeling, emotion or aversion associated with the perception of harm or threat of harm. It is the psychological component of pain.  Pain Specialists prefer to separate the two components. The pain scale used by this practice is the Verbal Numerical Rating Scale (VNRS-11). This scale is for the physical pain only. DO NOT INCLUDE how your pain psychologically affects you. This scale is for adults 25 years of age and older. It has 11 (eleven) levels. The 1st level is 0/10. This means: "right now, I have no pain". In the context of pain management, it also means: "right now, my physical pain is under control with the current therapy".  General Information:  The scale should reflect your current level of pain. Unless you are specifically asked for the level of your worst pain, or your average pain. If you are asked for one of these two, then it should be understood that it is over the past 24 hours.  Levels 1 (one) through 5 (five) are described below, and can be treated as an outpatient. Ambulatory pain management facilities such as ours are more than adequate to treat these levels. Levels 6 (six) through 10 (ten) are also  described below, however, these must be treated as a hospitalized patient. While levels 6 (six) and 7 (seven) may be evaluated at an urgent care facility, levels 8 (eight) through 10 (ten) constitute medical emergencies and as such, they belong in a hospital's emergency department. When having these levels (as described below), do not come to our office. Our facility is not equipped to manage these levels. Go directly to an urgent care facility or an emergency department to be evaluated.  Definitions:  Activities of Daily Living (ADL): Activities of daily living (ADL or ADLs) is a term used in healthcare to refer to people's daily self-care activities. Health professionals often use a person's ability or inability to perform ADLs as a measurement of their functional status, particularly in regard to people post injury, with disabilities and the elderly. There are two ADL levels: Basic and Instrumental. Basic Activities of Daily Living (BADL  or BADLs) consist of self-care tasks that include: Bathing and showering; personal hygiene and grooming (including brushing/combing/styling hair); dressing; Toilet hygiene (getting to the toilet, cleaning oneself, and getting back up); eating and self-feeding (not including cooking or chewing and swallowing); functional mobility, often referred to as "transferring", as measured by the ability to walk, get in and out of bed, and get into and out of a chair; the broader definition (moving from one place to another while performing activities) is useful for people with different physical abilities who are still able to get around independently. Basic ADLs include the things many people do when they get up in the morning and get ready to go out of the house: get out of bed, go to the toilet, bathe, dress, groom, and eat. On the average, loss of function typically follows a particular order. Hygiene is the first to go, followed by loss of toilet use and locomotion. The last to  go is the ability  to eat. When there is only one remaining area in which the person is independent, there is a 62.9% chance that it is eating and only a 3.5% chance that it is hygiene. Instrumental Activities of Daily Living (IADL or IADLs) are not necessary for fundamental functioning, but they let an individual live independently in a community. IADL consist of tasks that include: cleaning and maintaining the house; home establishment and maintenance; care of others (including selecting and supervising caregivers); care of pets; child rearing; managing money; managing financials (investments, etc.); meal preparation and cleanup; shopping for groceries and necessities; moving within the community; safety procedures and emergency responses; health management and maintenance (taking prescribed medications); and using the telephone or other form of communication.  Instructions:  Most patients tend to report their pain as a combination of two factors, their physical pain and their psychosocial pain. This last one is also known as "suffering" and it is reflection of how physical pain affects you socially and psychologically. From now on, report them separately.  From this point on, when asked to report your pain level, report only your physical pain. Use the following table for reference.  Pain Clinic Pain Levels (0-5/10)  Pain Level Score  Description  No Pain 0   Mild pain 1 Nagging, annoying, but does not interfere with basic activities of daily living (ADL). Patients are able to eat, bathe, get dressed, toileting (being able to get on and off the toilet and perform personal hygiene functions), transfer (move in and out of bed or a chair without assistance), and maintain continence (able to control bladder and bowel functions). Blood pressure and heart rate are unaffected. A normal heart rate for a healthy adult ranges from 60 to 100 bpm (beats per minute).   Mild to moderate pain 2 Noticeable and  distracting. Impossible to hide from other people. More frequent flare-ups. Still possible to adapt and function close to normal. It can be very annoying and may have occasional stronger flare-ups. With discipline, patients may get used to it and adapt.   Moderate pain 3 Interferes significantly with activities of daily living (ADL). It becomes difficult to feed, bathe, get dressed, get on and off the toilet or to perform personal hygiene functions. Difficult to get in and out of bed or a chair without assistance. Very distracting. With effort, it can be ignored when deeply involved in activities.   Moderately severe pain 4 Impossible to ignore for more than a few minutes. With effort, patients may still be able to manage work or participate in some social activities. Very difficult to concentrate. Signs of autonomic nervous system discharge are evident: dilated pupils (mydriasis); mild sweating (diaphoresis); sleep interference. Heart rate becomes elevated (>115 bpm). Diastolic blood pressure (lower number) rises above 100 mmHg. Patients find relief in laying down and not moving.   Severe pain 5 Intense and extremely unpleasant. Associated with frowning face and frequent crying. Pain overwhelms the senses.  Ability to do any activity or maintain social relationships becomes significantly limited. Conversation becomes difficult. Pacing back and forth is common, as getting into a comfortable position is nearly impossible. Pain wakes you up from deep sleep. Physical signs will be obvious: pupillary dilation; increased sweating; goosebumps; brisk reflexes; cold, clammy hands and feet; nausea, vomiting or dry heaves; loss of appetite; significant sleep disturbance with inability to fall asleep or to remain asleep. When persistent, significant weight loss is observed due to the complete loss of appetite and sleep deprivation.  Blood pressure  and heart rate becomes significantly elevated. Caution: If elevated  blood pressure triggers a pounding headache associated with blurred vision, then the patient should immediately seek attention at an urgent or emergency care unit, as these may be signs of an impending stroke.    Emergency Department Pain Levels (6-10/10)  Emergency Room Pain 6 Severely limiting. Requires emergency care and should not be seen or managed at an outpatient pain management facility. Communication becomes difficult and requires great effort. Assistance to reach the emergency department may be required. Facial flushing and profuse sweating along with potentially dangerous increases in heart rate and blood pressure will be evident.   Distressing pain 7 Self-care is very difficult. Assistance is required to transport, or use restroom. Assistance to reach the emergency department will be required. Tasks requiring coordination, such as bathing and getting dressed become very difficult.   Disabling pain 8 Self-care is no longer possible. At this level, pain is disabling. The individual is unable to do even the most "basic" activities such as walking, eating, bathing, dressing, transferring to a bed, or toileting. Fine motor skills are lost. It is difficult to think clearly.   Incapacitating pain 9 Pain becomes incapacitating. Thought processing is no longer possible. Difficult to remember your own name. Control of movement and coordination are lost.   The worst pain imaginable 10 At this level, most patients pass out from pain. When this level is reached, collapse of the autonomic nervous system occurs, leading to a sudden drop in blood pressure and heart rate. This in turn results in a temporary and dramatic drop in blood flow to the brain, leading to a loss of consciousness. Fainting is one of the body's self defense mechanisms. Passing out puts the brain in a calmed state and causes it to shut down for a while, in order to begin the healing process.    Summary: 1.   Refer to this scale when  providing [place] with your pain level. 2.   Be accurate and careful when reporting your pain level. This will help with your care. 3.   Over-reporting your pain level will lead to loss of credibility. 4.   Even a level of 1/10 means that there is pain and will be treated at our facility. 5.   High, inaccurate reporting will be documented as "Symptom Exaggeration", leading to loss of credibility and suspicions of possible secondary gains such as obtaining more narcotics, or wanting to appear disabled, for fraudulent reasons. 6.   Only pain levels of 5 or below will be seen at our facility. 7.   Pain levels of 6 and above will be sent to the Emergency Department and the appointment cancelled.  ______________________________________________________________________________________________

## 2020-11-18 ENCOUNTER — Telehealth: Payer: Self-pay | Admitting: Pain Medicine

## 2020-11-18 NOTE — Telephone Encounter (Signed)
Patient did not get his Gabapentin refilled at his last visit.  Can you send it in?

## 2020-11-21 ENCOUNTER — Telehealth: Payer: Self-pay | Admitting: Pain Medicine

## 2020-11-21 NOTE — Telephone Encounter (Signed)
Called patient back and left voicemail that it was my understanding that all medicines being prescribed by Dr Dossie Arbour with the exception of narcotics would be turned over the PCP for management.  I did confirm that there was a letter sent to this patient's PCP stating the intention.  Told him to check with his PCP and if he has any additional questions to please call.

## 2020-11-21 NOTE — Telephone Encounter (Signed)
Patient is calling about his gabapentin, script ran out 11-01-20. Please check why he did not get refill on this when he came for med refill. Let patient know please

## 2021-01-17 ENCOUNTER — Ambulatory Visit: Payer: Medicare Other | Attending: Pain Medicine | Admitting: Pain Medicine

## 2021-01-17 ENCOUNTER — Encounter: Payer: Self-pay | Admitting: Pain Medicine

## 2021-01-17 ENCOUNTER — Other Ambulatory Visit: Payer: Self-pay

## 2021-01-17 VITALS — BP 111/63 | HR 82 | Temp 97.1°F | Resp 16 | Ht 63.0 in | Wt 165.0 lb

## 2021-01-17 DIAGNOSIS — M79605 Pain in left leg: Secondary | ICD-10-CM | POA: Insufficient documentation

## 2021-01-17 DIAGNOSIS — G8929 Other chronic pain: Secondary | ICD-10-CM | POA: Insufficient documentation

## 2021-01-17 DIAGNOSIS — R0789 Other chest pain: Secondary | ICD-10-CM | POA: Insufficient documentation

## 2021-01-17 DIAGNOSIS — Z79891 Long term (current) use of opiate analgesic: Secondary | ICD-10-CM | POA: Diagnosis present

## 2021-01-17 DIAGNOSIS — M4310 Spondylolisthesis, site unspecified: Secondary | ICD-10-CM | POA: Diagnosis present

## 2021-01-17 DIAGNOSIS — Z79899 Other long term (current) drug therapy: Secondary | ICD-10-CM | POA: Diagnosis present

## 2021-01-17 DIAGNOSIS — F1120 Opioid dependence, uncomplicated: Secondary | ICD-10-CM | POA: Diagnosis present

## 2021-01-17 DIAGNOSIS — G894 Chronic pain syndrome: Secondary | ICD-10-CM | POA: Diagnosis present

## 2021-01-17 DIAGNOSIS — M79604 Pain in right leg: Secondary | ICD-10-CM | POA: Insufficient documentation

## 2021-01-17 DIAGNOSIS — M25511 Pain in right shoulder: Secondary | ICD-10-CM | POA: Diagnosis present

## 2021-01-17 MED ORDER — HYDROCODONE-ACETAMINOPHEN 5-325 MG PO TABS: 1.0000 | ORAL_TABLET | Freq: Two times a day (BID) | ORAL | 0 refills | Status: DC | PRN

## 2021-01-17 NOTE — Patient Instructions (Signed)
____________________________________________________________________________________________  Drug Holidays (Slow)  What is a "Drug Holiday"? Drug Holiday: is the name given to the period of time during which a patient stops taking a medication(s) for the purpose of eliminating tolerance to the drug.  Benefits . Improved effectiveness of opioids. . Decreased opioid dose needed to achieve benefits. . Improved pain with lesser dose.  What is tolerance? Tolerance: is the progressive decreased in effectiveness of a drug due to its repetitive use. With repetitive use, the body gets use to the medication and as a consequence, it loses its effectiveness. This is a common problem seen with opioid pain medications. As a result, a larger dose of the drug is needed to achieve the same effect that used to be obtained with a smaller dose.  How long should a "Drug Holiday" last? You should stay off of the pain medicine for at least 14 consecutive days. (2 weeks)  Should I stop the medicine "cold turkey"? No. You should always coordinate with your Pain Specialist so that he/she can provide you with the correct medication dose to make the transition as smoothly as possible.  How do I stop the medicine? Slowly. You will be instructed to decrease the daily amount of pills that you take by one (1) pill every seven (7) days. This is called a "slow downward taper" of your dose. For example: if you normally take four (4) pills per day, you will be asked to drop this dose to three (3) pills per day for seven (7) days, then to two (2) pills per day for seven (7) days, then to one (1) per day for seven (7) days, and at the end of those last seven (7) days, this is when the "Drug Holiday" would start.   Will I have withdrawals? By doing a "slow downward taper" like this one, it is unlikely that you will experience any significant withdrawal symptoms. Typically, what triggers withdrawals is the sudden stop of a high  dose opioid therapy. Withdrawals can usually be avoided by slowly decreasing the dose over a prolonged period of time. If you do not follow these instructions and decide to stop your medication abruptly, withdrawals may be possible.  What are withdrawals? Withdrawals: refers to the wide range of symptoms that occur after stopping or dramatically reducing opiate drugs after heavy and prolonged use. Withdrawal symptoms do not occur to patients that use low dose opioids, or those who take the medication sporadically. Contrary to benzodiazepine (example: Valium, Xanax, etc.) or alcohol withdrawals ("Delirium Tremens"), opioid withdrawals are not lethal. Withdrawals are the physical manifestation of the body getting rid of the excess receptors.  Expected Symptoms Early symptoms of withdrawal may include: . Agitation . Anxiety . Muscle aches . Increased tearing . Insomnia . Runny nose . Sweating . Yawning  Late symptoms of withdrawal may include: . Abdominal cramping . Diarrhea . Dilated pupils . Goose bumps . Nausea . Vomiting  Will I experience withdrawals? Due to the slow nature of the taper, it is very unlikely that you will experience any.  What is a slow taper? Taper: refers to the gradual decrease in dose.  (Last update: 03/23/2020) ____________________________________________________________________________________________    ____________________________________________________________________________________________  Medication Recommendations and Reminders  Applies to: All patients receiving prescriptions (written and/or electronic).  Medication Rules & Regulations: These rules and regulations exist for your safety and that of others. They are not flexible and neither are we. Dismissing or ignoring them will be considered "non-compliance" with medication therapy, resulting in complete   and irreversible termination of such therapy. (See document titled "Medication Rules" for  more details.) In all conscience, because of safety reasons, we cannot continue providing a therapy where the patient does not follow instructions.  Pharmacy of record:   Definition: This is the pharmacy where your electronic prescriptions will be sent.   We do not endorse any particular pharmacy, however, we have experienced problems with [facility] not securing enough medication supply for the community.  We do not restrict you in your choice of pharmacy. However, once we write for your prescriptions, we will NOT be re-sending more prescriptions to fix restricted supply problems created by your pharmacy, or your insurance.   The pharmacy listed in the electronic medical record should be the one where you want electronic prescriptions to be sent.  If you choose to change pharmacy, simply notify our nursing staff.  Recommendations:  Keep all of your pain medications in a safe place, under lock and key, even if you live alone. We will NOT replace lost, stolen, or damaged medication.  After you fill your prescription, take 1 week's worth of pills and put them away in a safe place. You should keep a separate, properly labeled bottle for this purpose. The remainder should be kept in the original bottle. Use this as your primary supply, until it runs out. Once it's gone, then you know that you have 1 week's worth of medicine, and it is time to come in for a prescription refill. If you do this correctly, it is unlikely that you will ever run out of medicine.  To make sure that the above recommendation works, it is very important that you make sure your medication refill appointments are scheduled at least 1 week before you run out of medicine. To do this in an effective manner, make sure that you do not leave the office without scheduling your next medication management appointment. Always ask the nursing staff to show you in your prescription , when your medication will be running out. Then arrange for  the receptionist to get you a return appointment, at least 7 days before you run out of medicine. Do not wait until you have 1 or 2 pills left, to come in. This is very poor planning and does not take into consideration that we may need to cancel appointments due to bad weather, sickness, or emergencies affecting our staff.  DO NOT ACCEPT A "Partial Fill": If for any reason your pharmacy does not have enough pills/tablets to completely fill or refill your prescription, do not allow for a "partial fill". The law allows the pharmacy to complete that prescription within 72 hours, without requiring a new prescription. If they do not fill the rest of your prescription within those 72 hours, you will need a separate prescription to fill the remaining amount, which we will NOT provide. If the reason for the partial fill is your insurance, you will need to talk to the pharmacist about payment alternatives for the remaining tablets, but again, DO NOT ACCEPT A PARTIAL FILL, unless you can trust your pharmacist to obtain the remainder of the pills within 72 hours.  Prescription refills and/or changes in medication(s):   Prescription refills, and/or changes in dose or medication, will be conducted only during scheduled medication management appointments. (Applies to both, written and electronic prescriptions.)  No refills on procedure days. No medication will be changed or started on procedure days. No changes, adjustments, and/or refills will be conducted on a procedure day. Doing so   will interfere with the diagnostic portion of the procedure.  No phone refills. No medications will be "called into the pharmacy".  No Fax refills.  No weekend refills.  No Holliday refills.  No after hours refills.  Remember:  Business hours are:  Monday to Thursday 8:00 AM to 4:00 PM Provider's Schedule: Princeton Nabor, MD - Appointments are:  Medication management: Monday and Wednesday 8:00 AM to 4:00 PM Procedure  day: Tuesday and Thursday 7:30 AM to 4:00 PM Bilal Lateef, MD - Appointments are:  Medication management: Tuesday and Thursday 8:00 AM to 4:00 PM Procedure day: Monday and Wednesday 7:30 AM to 4:00 PM (Last update: 03/23/2020) ____________________________________________________________________________________________   ____________________________________________________________________________________________  CBD (cannabidiol) WARNING  Applicable to: All individuals currently taking or considering taking CBD (cannabidiol) and, more important, all patients taking opioid analgesic controlled substances (pain medication). (Example: oxycodone; oxymorphone; hydrocodone; hydromorphone; morphine; methadone; tramadol; tapentadol; fentanyl; buprenorphine; butorphanol; dextromethorphan; meperidine; codeine; etc.)  Legal status: CBD remains a Schedule I drug prohibited for any use. CBD is illegal with one exception. In the [place], CBD has a limited Food and Drug Administration (FDA) approval for the treatment of two specific types of epilepsy disorders. Only one CBD product has been approved by the FDA for this purpose: "Epidiolex". FDA is aware that some companies are marketing products containing cannabis and cannabis-derived compounds in ways that violate the Federal Food, Drug and Cosmetic Act (FD&C Act) and that may put the health and safety of consumers at risk. The FDA, a Federal agency, has not enforced the CBD status since 2018.   Legality: Some manufacturers ship CBD products nationally, which is illegal. Often such products are sold online and are therefore available throughout the country. CBD is openly sold in head shops and health food stores in some states where such sales have not been explicitly legalized. Selling unapproved products with unsubstantiated therapeutic claims is not only a violation of the law, but also can put patients at risk, as these products have not been proven to  be safe or effective. Federal illegality makes it difficult to conduct research on CBD.  Reference: "FDA Regulation of Cannabis and Cannabis-Derived Products, Including Cannabidiol (CBD)" - https://www.fda.gov/news-events/public-health-focus/fda-regulation-cannabis-and-cannabis-derived-products-including-cannabidiol-cbd  Warning: CBD is not FDA approved and has not undergo the same manufacturing controls as prescription drugs.  This means that the purity and safety of available CBD may be questionable. Most of the time, despite manufacturer's claims, it is contaminated with THC (delta-9-tetrahydrocannabinol - the chemical in marijuana responsible for the "HIGH").  When this is the case, the THC contaminant will trigger a positive urine drug screen (UDS) test for Marijuana (carboxy-THC). Because a positive UDS for any illicit substance is a violation of our medication agreement, your opioid analgesics (pain medicine) may be permanently discontinued.  MORE ABOUT CBD  General Information: CBD  is a derivative of the Marijuana (cannabis sativa) plant discovered in 1940. It is one of the 113 identified substances found in Marijuana. It accounts for up to 40% of the plant's extract. As of 2018, preliminary clinical studies on CBD included research for the treatment of anxiety, movement disorders, and pain. CBD is available and consumed in multiple forms, including inhalation of smoke or vapor, as an aerosol spray, and by mouth. It may be supplied as an oil containing CBD, capsules, dried cannabis, or as a liquid solution. CBD is thought not to be as psychoactive as THC (delta-9-tetrahydrocannabinol - the chemical in marijuana responsible for the "HIGH"). Studies suggest that CBD may interact   with different biological target receptors in the body, including cannabinoid and other neurotransmitter receptors. As of 2018 the mechanism of action for its biological effects has not been determined.  Side-effects   Adverse reactions: Dry mouth, diarrhea, decreased appetite, fatigue, drowsiness, malaise, weakness, sleep disturbances, and others.  Drug interactions: CBC may interact with other medications such as blood-thinners. (Last update: 04/09/2020) ____________________________________________________________________________________________   ____________________________________________________________________________________________  Medication Rules  Purpose: To inform patients, and their family members, of our rules and regulations.  Applies to: All patients receiving prescriptions (written or electronic).  Pharmacy of record: Pharmacy where electronic prescriptions will be sent. If written prescriptions are taken to a different pharmacy, please inform the nursing staff. The pharmacy listed in the electronic medical record should be the one where you would like electronic prescriptions to be sent.  Electronic prescriptions: In compliance with the [place] Strengthen Opioid Misuse Prevention (STOP) Act of 2017 (Session Law 2017-74/H243), effective September 03, 2018, all controlled substances must be electronically prescribed. Calling prescriptions to the pharmacy will cease to exist.  Prescription refills: Only during scheduled appointments. Applies to all prescriptions.  NOTE: The following applies primarily to controlled substances (Opioid* Pain Medications).   Type of encounter (visit): For patients receiving controlled substances, face-to-face visits are required. (Not an option or up to the patient.)  Patient's responsibilities: 1. Pain Pills: Bring all pain pills to every appointment (except for procedure appointments). 2. Pill Bottles: Bring pills in original pharmacy bottle. Always bring the newest bottle. Bring bottle, even if empty. 3. Medication refills: You are responsible for knowing and keeping track of what medications you take and those you need refilled. The day before  your appointment: write a list of all prescriptions that need to be refilled. The day of the appointment: give the list to the admitting nurse. Prescriptions will be written only during appointments. No prescriptions will be written on procedure days. If you forget a medication: it will not be "Called in", "Faxed", or "electronically sent". You will need to get another appointment to get these prescribed. No early refills. Do not call asking to have your prescription filled early. 4. Prescription Accuracy: You are responsible for carefully inspecting your prescriptions before leaving our office. Have the discharge nurse carefully go over each prescription with you, before taking them home. Make sure that your name is accurately spelled, that your address is correct. Check the name and dose of your medication to make sure it is accurate. Check the number of pills, and the written instructions to make sure they are clear and accurate. Make sure that you are given enough medication to last until your next medication refill appointment. 5. Taking Medication: Take medication as prescribed. When it comes to controlled substances, taking less pills or less frequently than prescribed is permitted and encouraged. Never take more pills than instructed. Never take medication more frequently than prescribed.  6. Inform other Doctors: Always inform, all of your healthcare providers, of all the medications you take. 7. Pain Medication from other Providers: You are not allowed to accept any additional pain medication from any other Doctor or Healthcare provider. There are two exceptions to this rule. (see below) In the event that you require additional pain medication, you are responsible for notifying us, as stated below. 8. Cough Medicine: Often these contain an opioid, such as codeine or hydrocodone. Never accept or take cough medicine containing these opioids if you are already taking an opioid* medication. The  combination may cause respiratory failure and   death. 9. Medication Agreement: You are responsible for carefully reading and following our Medication Agreement. This must be signed before receiving any prescriptions from our practice. Safely store a copy of your signed Agreement. Violations to the Agreement will result in no further prescriptions. (Additional copies of our Medication Agreement are available upon request.) 10. Laws, Rules, & Regulations: All patients are expected to follow all Federal and State Laws, Statutes, Rules, & Regulations. Ignorance of the Laws does not constitute a valid excuse.  11. Illegal drugs and Controlled Substances: The use of illegal substances (including, but not limited to marijuana and its derivatives) and/or the illegal use of any controlled substances is strictly prohibited. Violation of this rule may result in the immediate and permanent discontinuation of any and all prescriptions being written by our practice. The use of any illegal substances is prohibited. 12. Adopted CDC guidelines & recommendations: Target dosing levels will be at or below 60 MME/day. Use of benzodiazepines** is not recommended.  Exceptions: There are only two exceptions to the rule of not receiving pain medications from other Healthcare Providers. 1. Exception #1 (Emergencies): In the event of an emergency (i.e.: accident requiring emergency care), you are allowed to receive additional pain medication. However, you are responsible for: As soon as you are able, call our office [phone], at any time of the day or night, and leave a message stating your name, the date and nature of the emergency, and the name and dose of the medication prescribed. In the event that your call is answered by a member of our staff, make sure to document and save the date, time, and the name of the person that took your information.  2. Exception #2 (Planned Surgery): In the event that you are scheduled by  another doctor or dentist to have any type of surgery or procedure, you are allowed (for a period no longer than 30 days), to receive additional pain medication, for the acute post-op pain. However, in this case, you are responsible for picking up a copy of our "Post-op Pain Management for Surgeons" handout, and giving it to your surgeon or dentist. This document is available at our office, and does not require an appointment to obtain it. Simply go to our office during business hours (Monday-Thursday from 8:00 AM to 4:00 PM) (Friday 8:00 AM to 12:00 Noon) or if you have a scheduled appointment with us, prior to your surgery, and ask for it by name. In addition, you are responsible for: calling our office [phone], at any time of the day or night, and leaving a message stating your name, name of your surgeon, type of surgery, and date of procedure or surgery. Failure to comply with your responsibilities may result in termination of therapy involving the controlled substances.  *Opioid medications include: morphine, codeine, oxycodone, oxymorphone, hydrocodone, hydromorphone, meperidine, tramadol, tapentadol, buprenorphine, fentanyl, methadone. **Benzodiazepine medications include: diazepam (Valium), alprazolam (Xanax), clonazepam (Klonopine), lorazepam (Ativan), clorazepate (Tranxene), chlordiazepoxide (Librium), estazolam (Prosom), oxazepam (Serax), temazepam (Restoril), triazolam (Halcion) (Last updated: 08/01/2020) ____________________________________________________________________________________________    

## 2021-01-17 NOTE — Progress Notes (Signed)
PROVIDER NOTE: Information contained herein reflects review and annotations entered in association with encounter. Interpretation of such information and data should be left to medically-trained personnel. Information provided to patient can be located elsewhere in the medical record under "Patient Instructions". Document created using STT-dictation technology, any transcriptional errors that may result from process are unintentional.    Patient: [name] [name].  Service Category: E/M  Provider: Gaspar Cola, MD  DOB: [birth year]  DOS: 01/17/2021  Specialty: Interventional Pain Management  MRN: [id number]  Setting: Ambulatory outpatient  PCP: Ranae Plumber, PA  Type: Established Patient    Referring Provider: Ranae Plumber, PA  Location: Office  Delivery: Face-to-face     HPI  Mr. Barack Nicodemus., a 65 y.o. year old male, is here today because of his Chronic pain syndrome [G89.4]. Mr. Hellwig primary complain today is Leg Pain (Bilateral ) and Chest Pain (midline) Last encounter: My last encounter with him was on 11/21/2020. Pertinent problems: Mr. Docken has Chronic shoulder pain (3ry area of Pain) (Right); Chronic hip pain (Left); Chronic chest wall pain (1ry area of Pain) (Incisional Midline) (since 04/22/2012); Chronic lower extremity pain (2ry area of Pain) (Bilateral) (R>L); Chronic sacroiliac joint pain (Bilateral) (L>R); Neuropathic pain; Neurogenic pain; Incisional pain; Lumbar facet syndrome (Bilateral) (R>L); Lumbar spondylosis; Keloid skin disorder; Costochondritis; Brachial plexus injury, right; Right sided weakness; Chronic pain syndrome; DDD (degenerative disc disease), lumbosacral; Lumbar facet arthropathy (Multilevel) (Bilateral); Grade 1 Anterolisthesis of L4 on L5; Spondylosis without myelopathy or radiculopathy, lumbosacral region; Other specified dorsopathies, sacral and sacrococcygeal region; Chronic low back pain (Bilateral) (R>L); and Osteoarthritis of hip (Left) on  their pertinent problem list. Pain Assessment: Severity of Chronic pain is reported as a 4 /10. Location: Leg (inicisional pain on chest) Left,Right/denies. Onset: More than a month ago. Quality: Discomfort,Constant. Timing: Constant. Modifying factor(s): medications are helping a little bit. Vitals:  height is '5\' 3"'  (1.6 m) and weight is 165 lb (74.8 kg). His temporal temperature is 97.1 F (36.2 C) (abnormal). His blood pressure is 111/63 and his pulse is 82. His respiration is 16 and oxygen saturation is 96%.   Reason for encounter: medication management.   The patient indicates doing well with the current medication regimen. No adverse reactions or side effects reported to the medications.   RTCB: 04/30/2021 Nonopioids transfer 07/25/2020: Gabapentin  Pharmacotherapy Assessment   Analgesic: Hydrocodone/APAP 5/325 mg, 1 tab PO BID (10 mg/day of hydrocodone) MME/day: 10 mg/day.   Monitoring: [place] PMP: PDMP reviewed during this encounter.       Pharmacotherapy: No side-effects or adverse reactions reported. Compliance: No problems identified. Effectiveness: Clinically acceptable.  Janett Billow, RN  01/17/2021  2:46 PM  Sign when Signing Visit Nursing Pain Medication Assessment:  Safety precautions to be maintained throughout the outpatient stay will include: orient to surroundings, keep bed in low position, maintain call bell within reach at all times, provide assistance with transfer out of bed and ambulation.  Medication Inspection Compliance: Pill count conducted under aseptic conditions, in front of the patient. Neither the pills nor the bottle was removed from the patient's sight at any time. Once count was completed pills were immediately returned to the patient in their original bottle.  Medication: Hydrocodone/APAP Pill/Patch Count: 27 of 60 pills remain Pill/Patch Appearance: Markings consistent with prescribed medication Bottle Appearance: Standard pharmacy container.  Clearly labeled. Filled Date: 04 / 30 / 2022 Last Medication intake:  Today    UDS:  Summary  Date Value  Ref Range Status  05/10/2020 Note  Final    Comment:    ==================================================================== [employer] [facility] (MW) ==================================================================== Test                             Result       Flag       Units  Drug Present and Declared for Prescription Verification   Hydrocodone                    1131         EXPECTED   ng/mg creat   Hydromorphone                  311          EXPECTED   ng/mg creat   Dihydrocodeine                 89           EXPECTED   ng/mg creat   Norhydrocodone                 525          EXPECTED   ng/mg creat    Sources of hydrocodone include scheduled prescription medications.    Hydromorphone, dihydrocodeine and norhydrocodone are expected    metabolites of hydrocodone. Hydromorphone and dihydrocodeine are    also available as scheduled prescription medications.  ==================================================================== Test                      Result    Flag   Units      Ref Range   Creatinine              192              mg/dL      >=20 ==================================================================== Declared Medications:  The flagging and interpretation on this report are based on the  following declared medications.  Unexpected results may arise from  inaccuracies in the declared medications.   **Note: The testing scope of this panel includes these medications:   Hydrocodone (Norco)   **Note: The testing scope of this panel does not include the  following reported medications:   Acetaminophen (Norco)  Allopurinol (Zyloprim)  Aspirin  Atorvastatin (Lipitor)  Gabapentin (Neurontin)  Hydrochlorothiazide (Hydrodiuril)  Insulin (Levemir)  Isosorbide (Imdur)  Linagliptin (Tradjenta)  Losartan (Cozaar)  Metoprolol (Lopressor)  Multivitamin   Nitroglycerin (Nitrostat)  Potassium  Semaglutide (Ozempic)  Vitamin D3 ==================================================================== For clinical consultation, please call [phone]. ====================================================================      ROS  Constitutional: Denies any fever or chills Gastrointestinal: No reported hemesis, hematochezia, vomiting, or acute GI distress Musculoskeletal: Denies any acute onset joint swelling, redness, loss of ROM, or weakness Neurological: No reported episodes of acute onset apraxia, aphasia, dysarthria, agnosia, amnesia, paralysis, loss of coordination, or loss of consciousness  Medication Review  HYDROcodone-acetaminophen, Insulin Pen Needle, Semaglutide(0.25 or 0.5MG/DOS), allopurinol, atorvastatin, gabapentin, insulin detemir, isosorbide mononitrate, linagliptin, losartan, metoprolol tartrate, multivitamin with minerals, nitroGLYCERIN, and pantoprazole  History Review  Allergy: Mr. Lortie has No Known Allergies. Drug: Mr. Vejar  reports no history of drug use. Alcohol:  reports current alcohol use. Tobacco:  reports that he has quit smoking. He quit after 25.00 years of use. He has quit using smokeless tobacco. Social: Mr. Allender  reports that he has quit smoking. He quit after 25.00 years of use. He has quit using  smokeless tobacco. He reports current alcohol use. He reports that he does not use drugs. Medical:  has a past medical history of Benign esophageal stricture, Cardiomyopathy, secondary ([place]), Coronary artery disease, Diabetes mellitus without complication ([place]), Diverticulitis, GI bleed, Gout, Hemorrhoids, Hyperlipidemia, Hypertension, Sleep apnea, and Tubular adenoma of colon. Surgical: Mr. Zuercher  has a past surgical history that includes Coronary angioplasty with stent; cardiac bypass; Cardiac surgery; Colonoscopy with propofol (N/A, 04/29/2015); Esophagogastroduodenoscopy (egd) with propofol (N/A,  04/29/2015); Septoplasty; Cardiac catheterization (Left, 02/03/2016); Coronary artery bypass graft; and Colonoscopy with propofol (N/A, 07/20/2020). Family: family history includes Heart disease in his mother; Hypertension in his father.  Laboratory Chemistry Profile   Renal Lab Results  Component Value Date   BUN 24 (H) 10/09/2020   CREATININE 1.14 10/09/2020   GFRAA >60 05/07/2019   GFRNONAA >60 10/09/2020     Hepatic Lab Results  Component Value Date   AST 54 (H) 10/09/2020   ALT 67 (H) 10/09/2020   ALBUMIN 3.9 10/09/2020   ALKPHOS 81 10/09/2020   HCVAB NON REACTIVE 10/09/2020   LIPASE 29 03/19/2015     Electrolytes Lab Results  Component Value Date   NA 138 10/09/2020   K 3.7 10/09/2020   CL 105 10/09/2020   CALCIUM 8.9 10/09/2020   MG 2.2 10/09/2020   PHOS 3.7 10/09/2020     Bone Lab Results  Component Value Date   VD25OH 74.7 05/07/2019   VD125OH2TOT 30.8 10/26/2015     Inflammation (CRP: Acute Phase) (ESR: Chronic Phase) Lab Results  Component Value Date   CRP 0.9 05/07/2019   ESRSEDRATE 41 (H) 05/07/2019   LATICACIDVEN 1.9 10/09/2020       Note: Above Lab results reviewed.  Recent Imaging Review  DG Shoulder Left CLINICAL DATA:  Left shoulder pain over the last few days which increases with movement. Tender to palpation. Initial encounter.  EXAM: LEFT SHOULDER - 2+ VIEW  COMPARISON:  None.  FINDINGS: The left shoulder is located. No acute abnormality is present. Calcification anterior to the proximal humerus on the axillary view may be within the tendon. Visualized hemithorax is clear.  IMPRESSION: 1. No acute abnormality. 2. Question calcific tendinitis along the deltoid.  Electronically Signed   By: San Morelle M.D.   On: 02/12/2019 07:48 Note: Reviewed        Physical Exam  General appearance: Well nourished, well developed, and well hydrated. In no apparent acute distress Mental status: Alert, oriented x 3 (person, place,  & time)       Respiratory: No evidence of acute respiratory distress Eyes: PERLA Vitals: BP 111/63 (BP Location: Right Arm, Patient Position: Sitting, Cuff Size: Normal)   Pulse 82   Temp (!) 97.1 F (36.2 C) (Temporal)   Resp 16   Ht '5\' 3"'  (1.6 m)   Wt 165 lb (74.8 kg)   SpO2 96%   BMI 29.23 kg/m  BMI: Estimated body mass index is 29.23 kg/m as calculated from the following:   Height as of this encounter: '5\' 3"'  (1.6 m).   Weight as of this encounter: 165 lb (74.8 kg). Ideal: Ideal body weight: 56.9 kg (125 lb 7.1 oz) Adjusted ideal body weight: 64.1 kg (141 lb 4.2 oz)  Assessment   Status Diagnosis  Controlled Controlled Controlled 1. Chronic pain syndrome   2. Chronic chest wall pain (1ry area of Pain) (Incisional Midline) (since 04/22/2012)   3. Chronic lower extremity pain (2ry area of Pain) (Bilateral) (R>L)   4. Chronic shoulder  pain (3ry area of Pain) (Right)   5. Grade 1 Anterolisthesis of L4 on L5   6. Pharmacologic therapy   7. Chronic use of opiate for therapeutic purpose   8. Uncomplicated opioid dependence ([place])      Updated Problems: Problem  Chronic Use of Opiate for Therapeutic Purpose  Uncomplicated Opioid Dependence ([facility])  Dm2 (Diabetes Mellitus, Type 2) ([facility])  Ckd (Chronic Kidney Disease), Stage III ([facility])  Elevated Lfts  Ckd Stage 2 Due to Type 2 Diabetes Mellitus ([facility])  Lower GI Bleed    Plan of Care  Problem-specific:  No problem-specific Assessment & Plan notes found for this encounter.  Mr. Trajon Rosete. has a current medication list which includes the following long-term medication(s): allopurinol, atorvastatin, gabapentin, insulin detemir, isosorbide mononitrate, linagliptin, metoprolol tartrate, pantoprazole, [START ON 01/30/2021] hydrocodone-acetaminophen, [START ON 03/01/2021] hydrocodone-acetaminophen, and [START ON 03/31/2021] hydrocodone-acetaminophen.  Pharmacotherapy (Medications Ordered): Meds ordered this encounter   Medications  . HYDROcodone-acetaminophen (NORCO/VICODIN) 5-325 MG tablet    Sig: Take 1 tablet by mouth 2 (two) times daily as needed for severe pain. Must last 30 days    Dispense:  60 tablet    Refill:  0    Not a duplicate. Do NOT delete! Dispense 1 day early if closed on refill date. Avoid benzodiazepines within 8 hours of opioids. Do not send refill requests.  Marland Kitchen HYDROcodone-acetaminophen (NORCO/VICODIN) 5-325 MG tablet    Sig: Take 1 tablet by mouth 2 (two) times daily as needed for severe pain. Must last 30 days    Dispense:  60 tablet    Refill:  0    Not a duplicate. Do NOT delete! Dispense 1 day early if closed on refill date. Avoid benzodiazepines within 8 hours of opioids. Do not send refill requests.  Marland Kitchen HYDROcodone-acetaminophen (NORCO/VICODIN) 5-325 MG tablet    Sig: Take 1 tablet by mouth 2 (two) times daily as needed for severe pain. Must last 30 days    Dispense:  60 tablet    Refill:  0    Not a duplicate. Do NOT delete! Dispense 1 day early if closed on refill date. Avoid benzodiazepines within 8 hours of opioids. Do not send refill requests.   Orders:  No orders of the defined types were placed in this encounter.  Follow-up plan:   Return in about 3 months (around 04/30/2021) for evaluation day (F2F) (MM).      Interventional management options: Planned, scheduled, and/or pending:   None at this time.   Considering:   Therapeutic bilateral Lumbar facet + bilateral sacroiliac joint RFA #1    Palliative PRN treatment(s):   Palliative left intra-articular hip joint injection #2  Palliative bilateral lumbar facet block #3  Palliative bilateral sacroiliac joint block #2     Recent Visits Date Type Provider Dept  10/31/20 Office Visit Milinda Pointer, MD [facility]  Showing recent visits within past 90 days and meeting all other requirements Today's Visits Date Type Provider Dept  01/17/21 Office Visit Milinda Pointer, MD [facility]   Showing today's visits and meeting all other requirements Future Appointments No visits were found meeting these conditions. Showing future appointments within next 90 days and meeting all other requirements  I discussed the assessment and treatment plan with the patient. The patient was provided an opportunity to ask questions and all were answered. The patient agreed with the plan and demonstrated an understanding of the instructions.  Patient advised to call back or seek an in-person  evaluation if the symptoms or condition worsens.  Duration of encounter: 30 minutes.  Note by: Gaspar Cola, MD Date: 01/17/2021; Time: 2:47 PM

## 2021-01-17 NOTE — Progress Notes (Signed)
Nursing Pain Medication Assessment:  Safety precautions to be maintained throughout the outpatient stay will include: orient to surroundings, keep bed in low position, maintain call bell within reach at all times, provide assistance with transfer out of bed and ambulation.  Medication Inspection Compliance: Pill count conducted under aseptic conditions, in front of the patient. Neither the pills nor the bottle was removed from the patient's sight at any time. Once count was completed pills were immediately returned to the patient in their original bottle.  Medication: Hydrocodone/APAP Pill/Patch Count: 27 of 60 pills remain Pill/Patch Appearance: Markings consistent with prescribed medication Bottle Appearance: Standard pharmacy container. Clearly labeled. Filled Date: 04 / 30 / 2022 Last Medication intake:  Today

## 2021-01-25 ENCOUNTER — Encounter: Payer: Medicare Other | Admitting: Pain Medicine

## 2021-01-26 ENCOUNTER — Other Ambulatory Visit: Payer: Self-pay

## 2021-01-26 ENCOUNTER — Emergency Department
Admission: EM | Admit: 2021-01-26 | Discharge: 2021-01-26 | Disposition: A | Discharge status: Home / Self Care | Payer: Medicare Other | Attending: Emergency Medicine | Admitting: Emergency Medicine

## 2021-01-26 DIAGNOSIS — I129 Hypertensive chronic kidney disease with stage 1 through stage 4 chronic kidney disease, or unspecified chronic kidney disease: Secondary | ICD-10-CM | POA: Diagnosis not present

## 2021-01-26 DIAGNOSIS — J029 Acute pharyngitis, unspecified: Secondary | ICD-10-CM

## 2021-01-26 DIAGNOSIS — Z79899 Other long term (current) drug therapy: Secondary | ICD-10-CM | POA: Insufficient documentation

## 2021-01-26 DIAGNOSIS — Z794 Long term (current) use of insulin: Secondary | ICD-10-CM | POA: Insufficient documentation

## 2021-01-26 DIAGNOSIS — I2510 Atherosclerotic heart disease of native coronary artery without angina pectoris: Secondary | ICD-10-CM | POA: Diagnosis not present

## 2021-01-26 DIAGNOSIS — E1122 Type 2 diabetes mellitus with diabetic chronic kidney disease: Secondary | ICD-10-CM | POA: Insufficient documentation

## 2021-01-26 DIAGNOSIS — J449 Chronic obstructive pulmonary disease, unspecified: Secondary | ICD-10-CM | POA: Insufficient documentation

## 2021-01-26 DIAGNOSIS — U071 COVID-19: Secondary | ICD-10-CM | POA: Diagnosis not present

## 2021-01-26 DIAGNOSIS — Z87891 Personal history of nicotine dependence: Secondary | ICD-10-CM | POA: Diagnosis not present

## 2021-01-26 DIAGNOSIS — N183 Chronic kidney disease, stage 3 unspecified: Secondary | ICD-10-CM | POA: Diagnosis not present

## 2021-01-26 DIAGNOSIS — Z8616 Personal history of COVID-19: Secondary | ICD-10-CM | POA: Insufficient documentation

## 2021-01-26 DIAGNOSIS — Z955 Presence of coronary angioplasty implant and graft: Secondary | ICD-10-CM | POA: Diagnosis not present

## 2021-01-26 NOTE — Discharge Instructions (Signed)
Take Tylenol and Ibuprofen alternating for sore throat.  COVID-19 usually last for approximately 5 days. You should expect fever, headache, cough, nasal congestion and body aches. Please stay quarantined at home for the next 7 days and only 72 hours after fever results.

## 2021-01-26 NOTE — ED Notes (Signed)
Patient discharged to home per MD order. Patient in stable condition, and deemed medically cleared by ED provider for discharge. Discharge instructions reviewed with patient/family using "Teach Back"; verbalized understanding of medication education and administration, and information about follow-up care. Denies further concerns. ° °

## 2021-01-26 NOTE — ED Triage Notes (Signed)
Pt brought in by [facility] for sore throat and fever, states tested positive for Covid today. Here because he wants to be checked out, no other symptoms. EMS gave 975 mg tylenol.

## 2021-01-26 NOTE — ED Provider Notes (Signed)
[facility]EMERGENCY DEPARTMENT  ____________________________________________  Time seen: Approximately 11:16 PM  I have reviewed the triage vital signs and the nursing notes.   HISTORY  Chief Complaint Sore Throat   Historian Patient     HPI [name] [name]. is a 65 y.o. male presents to the emergency department with low-grade fever, pharyngitis, headache and body aches.  Patient reports that he tested positive for COVID-19 today.  No chest pain, chest tightness or shortness of breath.  No recent travel.   Past Medical History:  Diagnosis Date  . Benign esophageal stricture   . Cardiomyopathy, secondary ([place])   . Coronary artery disease   . Diabetes mellitus without complication ([place])   . Diverticulitis   . GI bleed   . Gout   . Hemorrhoids   . Hyperlipidemia   . Hypertension   . Sleep apnea   . Tubular adenoma of colon      Immunizations up to date:  Yes.     Past Medical History:  Diagnosis Date  . Benign esophageal stricture   . Cardiomyopathy, secondary ([place])   . Coronary artery disease   . Diabetes mellitus without complication ([place])   . Diverticulitis   . GI bleed   . Gout   . Hemorrhoids   . Hyperlipidemia   . Hypertension   . Sleep apnea   . Tubular adenoma of colon     Patient Active Problem List   Diagnosis Date Noted  . Chronic use of opiate for therapeutic purpose 01/17/2021  . Uncomplicated opioid dependence ([place]) 10/31/2020  . Lower GI bleed 10/09/2020  . DM2 (diabetes mellitus, type 2) ([place]) 10/09/2020  . CKD (chronic kidney disease), stage III ([place]) 10/09/2020  . Elevated LFTs 10/09/2020  . CKD stage 2 due to type 2 diabetes mellitus ([place])   . Pharmacologic therapy 05/04/2019  . Disorder of skeletal system 05/04/2019  . Problems influencing health status 05/04/2019  . Osteoarthritis of hip (Left) 05/26/2018  . Spondylosis without myelopathy or radiculopathy, lumbosacral region 05/25/2018  . Other specified dorsopathies, sacral  and sacrococcygeal region 05/25/2018  . Chronic low back pain (Bilateral) (R>L) 05/25/2018  . DDD (degenerative disc disease), lumbosacral 05/13/2018  . Lumbar facet arthropathy (Multilevel) (Bilateral) 05/13/2018  . Grade 1 Anterolisthesis of L4 on L5 05/13/2018  . Chronic pain syndrome 08/16/2016  . Pneumonia 05/28/2016  . Pressure injury of skin 05/28/2016  . Opioid-induced constipation (OIC) 05/17/2016  . Hypokalemia 05/01/2016  . 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 Carotid atherosclerosis 04/04/2016  . Hypoalbuminemia 04/04/2016  . Right sided weakness 04/02/2016  . S/P CABG (coronary artery bypass graft) 03/30/2016  . Chest pain 02/29/2016  . Unstable angina ([place]) 02/24/2016  . Keloid skin disorder 01/17/2016  . Costochondritis 01/17/2016  . Long term current use of opiate analgesic 12/23/2015  . Long term prescription opiate use 12/23/2015  . Vitamin D insufficiency 11/21/2015  . Lumbar facet syndrome (Bilateral) (R>L) 11/21/2015  . Lumbar spondylosis 11/21/2015  . Elevated sedimentation rate 11/01/2015  . Chronic shoulder pain (3ry area of Pain) (Right) 10/25/2015  . Chronic hip pain (Left) 10/25/2015  . History of alcoholism ([place]) 10/25/2015  . Opiate use 10/25/2015  . Encounter for therapeutic drug level monitoring 10/25/2015  . Chronic chest wall pain (1ry area of Pain) ([place] Midline) (since 04/22/2012) 10/25/2015  . Chronic lower extremity pain (2ry area of Pain) (Bilateral) (R>L) 10/25/2015  . History of MI (myocardial infarction) (January 2014) 10/25/2015  . Encounter for pain management planning 10/25/2015  .  Chronic sacroiliac joint pain (Bilateral) (L>R) 10/25/2015  . Neuropathic pain 10/25/2015  . Neurogenic pain 10/25/2015  . Incisional pain 10/25/2015  . Hyperlipidemia 03/16/2014  . H/O coronary artery bypass surgery 05/12/2012  . Alcohol withdrawal syndrome ([place]) 04/28/2012  . Injury of kidney 04/25/2012  . OSA (obstructive sleep apnea)  04/22/2012  . Essential hypertension 04/22/2012  . Coronary artery disease 04/22/2012  . Apnea, sleep 04/21/2012  . Chronic obstructive pulmonary disease ([place]) 04/21/2012  . COPD (chronic obstructive pulmonary disease) ([place]) 04/21/2012    Past Surgical History:  Procedure Laterality Date  . cardiac bypass    . CARDIAC CATHETERIZATION Left 02/03/2016   Procedure: Left Heart Cath and Coronary Angiography;  Surgeon: Teodoro Spray, MD;  Location: [facility];  Service: Cardiovascular;  Laterality: Left;  . CARDIAC SURGERY    . COLONOSCOPY WITH PROPOFOL N/A 04/29/2015   Procedure: COLONOSCOPY WITH PROPOFOL;  Surgeon: Hulen Luster, MD;  Location[facility] ENDOSCOPY;  Service: Gastroenterology;  Laterality: N/A;  . COLONOSCOPY WITH PROPOFOL N/A 07/20/2020   Procedure: COLONOSCOPY WITH PROPOFOL;  Surgeon: Toledo, Benay Pike, MD;  Location: [facility] ENDOSCOPY;  Service: Gastroenterology;  Laterality: N/A;  . CORONARY ANGIOPLASTY WITH STENT PLACEMENT     x2  . CORONARY ARTERY BYPASS GRAFT     2013 & 2017  . ESOPHAGOGASTRODUODENOSCOPY (EGD) WITH PROPOFOL N/A 04/29/2015   Procedure: ESOPHAGOGASTRODUODENOSCOPY (EGD) WITH PROPOFOL;  Surgeon: Hulen Luster, MD;  Location: [facility] ENDOSCOPY;  Service: Gastroenterology;  Laterality: N/A;  . SEPTOPLASTY      Prior to Admission medications   Medication Sig Start Date End Date Taking? Authorizing Provider  allopurinol (ZYLOPRIM) 100 MG tablet Take 100 mg by mouth daily.  12/25/18   [provider]  atorvastatin (LIPITOR) 40 MG tablet Take 40 mg by mouth at bedtime.     [provider]  B-D UF III MINI PEN NEEDLES 31G X 5 MM MISC U WITH INSULIN PEN INJECTIONS D 12/30/18   [provider]  gabapentin (NEURONTIN) 600 MG tablet Take 1 tablet (600 mg total) by mouth 2 (two) times daily. 08/03/20 11/01/20  Milinda Pointer, MD  HYDROcodone-acetaminophen (NORCO/VICODIN) 5-325 MG tablet Take 1 tablet by mouth 2 (two) times daily as needed for severe  pain. Must last 30 days 01/30/21 03/01/21  Milinda Pointer, MD  HYDROcodone-acetaminophen (NORCO/VICODIN) 5-325 MG tablet Take 1 tablet by mouth 2 (two) times daily as needed for severe pain. Must last 30 days 03/01/21 03/31/21  Milinda Pointer, MD  HYDROcodone-acetaminophen (NORCO/VICODIN) 5-325 MG tablet Take 1 tablet by mouth 2 (two) times daily as needed for severe pain. Must last 30 days 03/31/21 04/30/21  Milinda Pointer, MD  insulin detemir (LEVEMIR) 100 UNIT/ML FlexPen Inject 30 Units into the skin at bedtime. Patient taking differently: Inject 46 Units into the skin 2 (two) times daily. 10/09/20   Loletha Grayer, MD  isosorbide mononitrate (IMDUR) 30 MG 24 hr tablet Take 30 mg by mouth every morning.     [provider]  linagliptin (TRADJENTA) 5 MG TABS tablet Take 5 mg by mouth daily.    [provider]  losartan (COZAAR) 100 MG tablet Take 100 mg by mouth at bedtime. 08/02/16   [provider]  metoprolol tartrate (LOPRESSOR) 50 MG tablet Take 1 tablet (50 mg total) by mouth 2 (two) times daily. Patient taking differently: Take 50 mg by mouth in the morning, at noon, and at bedtime. 10/09/20   Loletha Grayer, MD  Multiple Vitamin (MULTIVITAMIN WITH MINERALS)  TABS tablet Take 1 tablet by mouth every evening.    [provider]  nitroGLYCERIN (NITROSTAT) 0.4 MG SL tablet Place 0.4 mg under the tongue every 5 (five) minutes x 3 doses as needed for chest pain.  03/07/16   [provider]  OZEMPIC, 0.25 OR 0.5 MG/DOSE, 2 MG/1.5ML SOPN Inject 0.5 mg into the skin once a week. 10/10/20   [provider]  pantoprazole (PROTONIX) 40 MG tablet Take 1 tablet (40 mg total) by mouth daily. 10/09/20 10/09/21  Loletha Grayer, MD    Allergies Patient has no known allergies.  Family History  Problem Relation Age of Onset  . Heart disease Mother   . Hypertension Father     Social History Social History   Tobacco Use  . Smoking status: Former  Smoker    Years: 25.00  . Smokeless tobacco: Former Network engineer Use Topics  . Alcohol use: Yes    Comment: occasional  . Drug use: No     Review of Systems  Constitutional: Patient has fever.  Eyes:  No discharge ENT: Patient has pharyngitis.  Respiratory: no cough. No SOB/ use of accessory muscles to breath Gastrointestinal:   No nausea, no vomiting.  No diarrhea.  No constipation. Musculoskeletal: Negative for musculoskeletal pain. Skin: Negative for rash, abrasions, lacerations, ecchymosis.    ____________________________________________   PHYSICAL EXAM:  VITAL SIGNS: ED Triage Vitals  Enc Vitals Group     BP 01/26/21 2251 107/87     Pulse Rate 01/26/21 2251 (!) 103     Resp 01/26/21 2251 20     Temp 01/26/21 2249 (!) 100.7 F (38.2 C)     Temp Source 01/26/21 2249 Oral     SpO2 --      Weight 01/26/21 2250 165 lb (74.8 kg)     Height 01/26/21 2250 5\' 3"  (1.6 m)     Head Circumference --      Peak Flow --      Pain Score 01/26/21 2249 8     Pain Loc --      Pain Edu? --      Excl. in [place]? --      Constitutional: Alert and oriented. Patient is lying supine. Eyes: Conjunctivae are normal. PERRL. EOMI. Head: Atraumatic. ENT:      Ears: Tympanic membranes are mildly injected with mild effusion bilaterally.       Nose: No congestion/rhinnorhea.      Mouth/Throat: Mucous membranes are moist. Posterior pharynx is mildly erythematous.  Hematological/Lymphatic/Immunilogical: No cervical lymphadenopathy.  Cardiovascular: Normal rate, regular rhythm. Normal S1 and S2.  Good peripheral circulation. Respiratory: Normal respiratory effort without tachypnea or retractions. Lungs CTAB. Good air entry to the bases with no decreased or absent breath sounds. Gastrointestinal: Bowel sounds 4 quadrants. Soft and nontender to palpation. No guarding or rigidity. No palpable masses. No distention. No CVA tenderness. Musculoskeletal: Full range of motion to all extremities. No  gross deformities appreciated. Neurologic:  Normal speech and language. No gross focal neurologic deficits are appreciated.  Skin:  Skin is warm, dry and intact. No rash noted. Psychiatric: Mood and affect are normal. Speech and behavior are normal. Patient exhibits appropriate insight and judgement.   ____________________________________________   LABS (all labs ordered are listed, but only abnormal results are displayed)  Labs Reviewed - No data to display ____________________________________________  EKG   ____________________________________________  RADIOLOGY   No results found.  ____________________________________________    PROCEDURES  Procedure(s) performed:  Procedures     Medications - No data to display   ____________________________________________   INITIAL IMPRESSION / ASSESSMENT AND PLAN / ED COURSE  Pertinent labs & imaging results that were available during my care of the patient were reviewed by me and considered in my medical decision making (see chart for details).       Assessment and plan COVID-37 65 year old male presents to the emergency department with pharyngitis, low-grade fever, headache and body aches consistent with COVID-19.  Overall exam is reassuring.  Rest and hydration were encouraged at home.  Tylenol and ibuprofen alternating were recommended if fever occurs.  Return precautions were given to return with new or worsening symptoms.    ____________________________________________  FINAL CLINICAL IMPRESSION(S) / ED DIAGNOSES  Final diagnoses:  Pharyngitis, unspecified etiology      NEW MEDICATIONS STARTED DURING THIS VISIT:  ED Discharge Orders    None          This chart was dictated using voice recognition software/Dragon. Despite best efforts to proofread, errors can occur which can change the meaning. Any change was purely unintentional.     Lannie Fields, PA-C 01/26/21 2321    Naaman Plummer, MD 01/29/21 [phone]

## 2021-04-24 ENCOUNTER — Encounter: Payer: Medicare Other | Admitting: Pain Medicine

## 2021-04-24 NOTE — Progress Notes (Signed)
Patient: [name] [name].  Service Category: E/M  Provider: Gaspar Cola, MD  DOB: 1956/06/11  DOS: 04/25/2021  Location: Office  MRN: [id number]  Setting: Ambulatory outpatient  Referring Provider: Ranae Plumber, PA  Type: Established Patient  Specialty: Interventional Pain Management  PCP: Ranae Plumber, PA  Location: Remote location  Delivery: TeleHealth     Virtual Encounter - Pain Management PROVIDER NOTE: Information contained herein reflects review and annotations entered in association with encounter. Interpretation of such information and data should be left to medically-trained personnel. Information provided to patient can be located elsewhere in the medical record under "Patient Instructions". Document created using STT-dictation technology, any transcriptional errors that may result from process are unintentional.    Contact & Pharmacy Preferred: [phone] Home: [phone] (home) Mobile: [phone] (mobile) E-mail: [email or web address] [place], [place] AT [facility] [address] [place] [address] Phone: [phone] Fax: [phone]   Pre-screening  [name] [name] offered "in-person" vs "virtual" encounter. He indicated preferring virtual for this encounter.   Reason COVID-19*  Social distancing based on CDC and AMA recommendations.   I contacted [name] [name]. on 04/25/2021 via telephone.      I clearly identified myself as Gaspar Cola, MD. I verified that I was speaking with the correct person using two identifiers (Name: [name] Delcid., and date of birth: 12-Oct-1955).  Consent I sought verbal advanced consent from [name] [name]. for virtual visit interactions. I informed [name] [name] of possible security and privacy concerns, risks, and limitations associated with providing "not-in-person" medical evaluation and management services. I also informed [name] [name] of the  availability of "in-person" appointments. Finally, I informed him that there would be a charge for the virtual visit and that he could be  personally, fully or partially, financially responsible for it. [name] [name] expressed understanding and agreed to proceed.   Historic Elements   [name] is a 65 y.o. year old, male patient evaluated today after our last contact on 01/17/2021. [name] [name]  has a past medical history of Benign esophageal stricture, Cardiomyopathy, secondary ([place]), Coronary artery disease, Diabetes mellitus without complication ([place]), Diverticulitis, GI bleed, Gout, Hemorrhoids, Hyperlipidemia, Hypertension, Sleep apnea, and Tubular adenoma of colon. He also  has a past surgical history that includes Coronary angioplasty with stent; cardiac bypass; Cardiac surgery; Colonoscopy with propofol (N/A, 04/29/2015); Esophagogastroduodenoscopy (egd) with propofol (N/A, 04/29/2015); Septoplasty; Cardiac catheterization (Left, 02/03/2016); Coronary artery bypass graft; and Colonoscopy with propofol (N/A, 07/20/2020). [name] [name] has a current medication list which includes the following prescription(s): allopurinol, aspirin ec, atorvastatin, b-d uf iii mini pen needles, cholecalciferol, gabapentin, hydrochlorothiazide, insulin detemir, isosorbide mononitrate, linagliptin, losartan, metoprolol tartrate, multivitamin with minerals, nitroglycerin, ozempic (0.25 or 0.5 mg/dose), pantoprazole, potassium, [START ON 04/30/2021] hydrocodone-acetaminophen, and metoprolol tartrate. He  reports that he has quit smoking. He has quit using smokeless tobacco. He reports current alcohol use. He reports that he does not use drugs. [name] [name] has No Known Allergies.   HPI  Today, he is being contacted for medication management.  The patient indicates doing well with the current medication regimen. No adverse reactions or side effects reported to the medications.   RTCB: 05/30/2021 Nonopioids transfer  07/25/2020: Gabapentin  Pharmacotherapy Assessment   Analgesic: Hydrocodone/APAP 5/325 mg, 1 tab PO BID (10 mg/day of hydrocodone) MME/day: 10 mg/day.   Monitoring: [place] PMP: PDMP reviewed during  this encounter.       Pharmacotherapy: No side-effects or adverse reactions reported. Compliance: No problems identified. Effectiveness: Clinically acceptable. Plan: Refer to "POC". UDS:  Summary  Date Value Ref Range Status  05/10/2020 Note  Final    Comment:    ==================================================================== [employer] Select 13 (MW) ==================================================================== Test                             Result       Flag       Units  Drug Present and Declared for Prescription Verification   Hydrocodone                    1131         EXPECTED   ng/mg creat   Hydromorphone                  311          EXPECTED   ng/mg creat   Dihydrocodeine                 89           EXPECTED   ng/mg creat   Norhydrocodone                 525          EXPECTED   ng/mg creat    Sources of hydrocodone include scheduled prescription medications.    Hydromorphone, dihydrocodeine and norhydrocodone are expected    metabolites of hydrocodone. Hydromorphone and dihydrocodeine are    also available as scheduled prescription medications.  ==================================================================== Test                      Result    Flag   Units      Ref Range   Creatinine              192              mg/dL      >=20 ==================================================================== Declared Medications:  The flagging and interpretation on this report are based on the  following declared medications.  Unexpected results may arise from  inaccuracies in the declared medications.   **Note: The testing scope of this panel includes these medications:   Hydrocodone (Norco)   **Note: The testing scope of this panel does not include the  following  reported medications:   Acetaminophen (Norco)  Allopurinol (Zyloprim)  Aspirin  Atorvastatin (Lipitor)  Gabapentin (Neurontin)  Hydrochlorothiazide (Hydrodiuril)  Insulin (Levemir)  Isosorbide (Imdur)  Linagliptin (Tradjenta)  Losartan (Cozaar)  Metoprolol (Lopressor)  Multivitamin  Nitroglycerin (Nitrostat)  Potassium  Semaglutide (Ozempic)  Vitamin D3 ==================================================================== For clinical consultation, please call [phone]. ====================================================================      Laboratory Chemistry Profile   Renal Lab Results  Component Value Date   BUN 24 (H) 10/09/2020   CREATININE 1.14 10/09/2020   GFRAA >60 05/07/2019   GFRNONAA >60 10/09/2020    Hepatic Lab Results  Component Value Date   AST 54 (H) 10/09/2020   ALT 67 (H) 10/09/2020   ALBUMIN 3.9 10/09/2020   ALKPHOS 81 10/09/2020   HCVAB NON REACTIVE 10/09/2020   LIPASE 29 03/19/2015    Electrolytes Lab Results  Component Value Date   NA 138 10/09/2020   K 3.7 10/09/2020   CL 105 10/09/2020   CALCIUM 8.9 10/09/2020   MG 2.2 10/09/2020   PHOS 3.7 10/09/2020  Bone Lab Results  Component Value Date   VD25OH 74.7 05/07/2019   VD125OH2TOT 30.8 10/26/2015    Inflammation (CRP: Acute Phase) (ESR: Chronic Phase) Lab Results  Component Value Date   CRP 0.9 05/07/2019   ESRSEDRATE 41 (H) 05/07/2019   LATICACIDVEN 1.9 10/09/2020         Note: Above Lab results reviewed.  Imaging  DG Shoulder Left CLINICAL DATA:  Left shoulder pain over the last few days which increases with movement. Tender to palpation. Initial encounter.  EXAM: LEFT SHOULDER - 2+ VIEW  COMPARISON:  None.  FINDINGS: The left shoulder is located. No acute abnormality is present. Calcification anterior to the proximal humerus on the axillary view may be within the tendon. Visualized hemithorax is clear.  IMPRESSION: 1. No acute abnormality. 2.  Question calcific tendinitis along the deltoid.  Electronically Signed   By: San Morelle M.D.   On: 02/12/2019 07:48  Assessment  The encounter diagnosis was Chronic pain syndrome.  Plan of Care  Problem-specific:  No problem-specific Assessment & Plan notes found for this encounter.  [name] [name]. has a current medication list which includes the following long-term medication(s): allopurinol, atorvastatin, gabapentin, insulin detemir, isosorbide mononitrate, linagliptin, metoprolol tartrate, pantoprazole, [START ON 04/30/2021] hydrocodone-acetaminophen, and metoprolol tartrate.  Pharmacotherapy (Medications Ordered): Meds ordered this encounter  Medications   HYDROcodone-acetaminophen (NORCO/VICODIN) 5-325 MG tablet    Sig: Take 1 tablet by mouth 2 (two) times daily as needed for severe pain. Must last 30 days    Dispense:  60 tablet    Refill:  0    Not a duplicate. Do NOT delete! Dispense 1 day early if closed on refill date. Avoid benzodiazepines within 8 hours of opioids. Do not send refill requests.   Orders:  No orders of the defined types were placed in this encounter.  Follow-up plan:   Return in about 5 weeks (around 05/30/2021) for Eval-day(M,W), (F2F), (MM).     Interventional management options: Planned, scheduled, and/or pending:   None at this time.   Considering:   Therapeutic bilateral Lumbar facet + bilateral sacroiliac joint RFA #1    Palliative PRN treatment(s):   Palliative left intra-articular hip joint injection #2  Palliative bilateral lumbar facet block #3  Palliative bilateral sacroiliac joint block #2      Recent Visits No visits were found meeting these conditions. Showing recent visits within past 90 days and meeting all other requirements Today's Visits Date Type Provider Dept  04/25/21 Telemedicine Milinda Pointer, MD [facility]  Showing today's visits and meeting all other requirements Future  Appointments Date Type Provider Dept  05/22/21 Appointment Milinda Pointer, MD [facility]  Showing future appointments within next 90 days and meeting all other requirements I discussed the assessment and treatment plan with the patient. The patient was provided an opportunity to ask questions and all were answered. The patient agreed with the plan and demonstrated an understanding of the instructions.  Patient advised to call back or seek an in-person evaluation if the symptoms or condition worsens.  Duration of encounter: 12 minutes.  Note by: Gaspar Cola, MD Date: 04/25/2021; Time: 3:42 PM

## 2021-04-25 ENCOUNTER — Ambulatory Visit: Payer: Medicare Other | Attending: Pain Medicine | Admitting: Pain Medicine

## 2021-04-25 ENCOUNTER — Other Ambulatory Visit: Payer: Self-pay

## 2021-04-25 DIAGNOSIS — G894 Chronic pain syndrome: Secondary | ICD-10-CM

## 2021-04-25 MED ORDER — HYDROCODONE-ACETAMINOPHEN 5-325 MG PO TABS: 1.0000 | ORAL_TABLET | Freq: Two times a day (BID) | ORAL | 0 refills | Status: DC | PRN

## 2021-05-20 NOTE — Progress Notes (Signed)
PROVIDER NOTE: Information contained herein reflects review and annotations entered in association with encounter. Interpretation of such information and data should be left to medically-trained personnel. Information provided to patient can be located elsewhere in the medical record under "Patient Instructions". Document created using STT-dictation technology, any transcriptional errors that may result from process are unintentional.    Patient: [name] [name].  Service Category: E/M  Provider: Gaspar Cola, MD  DOB: [birth year]  DOS: 05/22/2021  Specialty: Interventional Pain Management  MRN: [id number]  Setting: Ambulatory outpatient  PCP: Ranae Plumber, PA  Type: Established Patient    Referring Provider: Ranae Plumber, PA  Location: Office  Delivery: Face-to-face     HPI  Mr. [name] Janssen., a 65 y.o. year old male, is here today because of his Chronic pain syndrome [G89.4]. [name] primary complain today is Generalized Body Aches Last encounter: My last encounter with him was on 01/17/2021. Pertinent problems: [name] has Chronic shoulder pain (3ry area of Pain) (Right); Chronic hip pain (Left); Chronic chest wall pain (1ry area of Pain) (Incisional Midline) (since 04/22/2012); Chronic lower extremity pain (2ry area of Pain) (Bilateral) (R>L); Chronic sacroiliac joint pain (Bilateral) (L>R); Neuropathic pain; Neurogenic pain; Incisional pain; Lumbar facet syndrome (Bilateral) (R>L); Lumbar spondylosis; Keloid skin disorder; Costochondritis; Brachial plexus injury, right; Right sided weakness; Chronic pain syndrome; DDD (degenerative disc disease), lumbosacral; Lumbar facet arthropathy (Multilevel) (Bilateral); 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 Pain Assessment: Severity of Chronic pain is reported as a 4 /10. Location: Generalized Right, Left/everywhere. Onset: More than a month ago. Quality: Aching, Burning, Constant, Throbbing, Sharp, Shooting. Timing: Constant. Modifying factor(s): meds. Vitals:  height is '5\' 3"'  (1.6 m) and weight is 165 lb (74.8 kg). His temperature is 97 F (36.1 C) (abnormal). His blood pressure is 123/65 and his pulse is 82. His respiration is 16 and oxygen saturation is 94%.   Reason for encounter: medication management.   The patient indicates doing well with the current medication regimen. No adverse reactions or side effects reported to the medications.   UDS ordered today.   RTCB: 08/28/2021 Nonopioids transfer 07/25/2020: Gabapentin  Pharmacotherapy Assessment  Analgesic: Hydrocodone/APAP 5/325 mg, 1 tab PO BID (10 mg/day of hydrocodone) MME/day: 10 mg/day.   Monitoring: [place] PMP: PDMP reviewed during this encounter.       Pharmacotherapy: No side-effects or adverse reactions reported. Compliance: No problems identified. Effectiveness: Clinically acceptable.  Chauncey Fischer, RN  05/22/2021  2:54 PM  Sign when Signing Visit Nursing Pain Medication Assessment:  Safety precautions to be maintained throughout the outpatient stay will include: orient to surroundings, keep bed in low position, maintain call bell within reach at all times, provide assistance with transfer out of bed and ambulation.  Medication Inspection Compliance: Pill count conducted under aseptic conditions, in front of the patient. Neither the pills nor the bottle was removed from the patient's sight at any time. Once count was completed pills were immediately returned to the patient in their original bottle.  Medication: Hydrocodone/APAP Pill/Patch Count:  17 of 60 pills remain Pill/Patch Appearance: Markings consistent with prescribed medication Bottle Appearance: Standard pharmacy container. Clearly  labeled. Filled Date: 8 / 47 / 2022 Last Medication intake:  Today Safety precautions to be maintained throughout the outpatient stay will include:  orient to surroundings, keep bed in low position, maintain call bell within reach at all times, provide assistance with transfer out of bed and ambulation.      UDS:  Summary  Date Value Ref Range Status  05/10/2020 Note  Final    Comment:    ==================================================================== [employer] Select 13 (MW) ==================================================================== Test                             Result       Flag       Units  Drug Present and Declared for Prescription Verification   Hydrocodone                    1131         EXPECTED   ng/mg creat   Hydromorphone                  311          EXPECTED   ng/mg creat   Dihydrocodeine                 89           EXPECTED   ng/mg creat   Norhydrocodone                 525          EXPECTED   ng/mg creat    Sources of hydrocodone include scheduled prescription medications.    Hydromorphone, dihydrocodeine and norhydrocodone are expected    metabolites of hydrocodone. Hydromorphone and dihydrocodeine are    also available as scheduled prescription medications.  ==================================================================== Test                      Result    Flag   Units      Ref Range   Creatinine              192              mg/dL      >=20 ==================================================================== Declared Medications:  The flagging and interpretation on this report are based on the  following declared medications.  Unexpected results may arise from  inaccuracies in the declared medications.   **Note: The testing scope of this panel includes these medications:   Hydrocodone (Norco)   **Note: The testing scope of this panel does not include the  following reported medications:   Acetaminophen (Norco)  Allopurinol (Zyloprim)   Aspirin  Atorvastatin (Lipitor)  Gabapentin (Neurontin)  Hydrochlorothiazide (Hydrodiuril)  Insulin (Levemir)  Isosorbide (Imdur)  Linagliptin (Tradjenta)  Losartan (Cozaar)  Metoprolol (Lopressor)  Multivitamin  Nitroglycerin (Nitrostat)  Potassium  Semaglutide (Ozempic)  Vitamin D3 ==================================================================== For clinical consultation, please call [phone]. ====================================================================      ROS  Constitutional: Denies any fever or chills Gastrointestinal: No reported hemesis, hematochezia, vomiting, or acute GI distress Musculoskeletal: Denies any acute onset joint swelling, redness, loss of ROM, or weakness Neurological: No reported episodes of acute onset apraxia, aphasia, dysarthria, agnosia, amnesia, paralysis, loss of coordination, or loss of consciousness  Medication Review  Cholecalciferol, HYDROcodone-acetaminophen, Insulin Pen Needle, Potassium, Semaglutide(0.25 or 0.5MG/DOS), allopurinol, aspirin EC, atorvastatin, gabapentin, hydrochlorothiazide, insulin detemir, isosorbide mononitrate, linagliptin, losartan, metoprolol tartrate, multivitamin with minerals, nitroGLYCERIN, and pantoprazole  History Review  Allergy: [name] has No Known Allergies. Drug: [name]  reports no history of drug use. Alcohol:  reports current alcohol use. Tobacco:  reports that he has quit smoking. His smoking use included cigarettes. He has quit using smokeless tobacco. Social: [name]  reports that he has quit smoking. His smoking use included cigarettes. He has quit using smokeless tobacco. He reports current alcohol use. He reports that he does not use drugs. Medical:  has a past medical history of Benign esophageal stricture, Cardiomyopathy, secondary ([place]), Coronary artery disease, Diabetes mellitus without complication ([place]), Diverticulitis, GI bleed, Gout, Hemorrhoids, Hyperlipidemia,  Hypertension, Sleep apnea, and Tubular adenoma of colon. Surgical: Mr. Sansoucie  has a past surgical history that includes Coronary angioplasty with stent; cardiac bypass; Cardiac surgery; Colonoscopy with propofol (N/A, 04/29/2015); Esophagogastroduodenoscopy (egd) with propofol (N/A, 04/29/2015); Septoplasty; Cardiac catheterization (Left, 02/03/2016); Coronary artery bypass graft; and Colonoscopy with propofol (N/A, 07/20/2020). Family: family history includes Heart disease in his mother; Hypertension in his father.  Laboratory Chemistry Profile   Renal Lab Results  Component Value Date   BUN 24 (H) 10/09/2020   CREATININE 1.14 10/09/2020   GFRAA >60 05/07/2019   GFRNONAA >60 10/09/2020    Hepatic Lab Results  Component Value Date   AST 54 (H) 10/09/2020   ALT 67 (H) 10/09/2020   ALBUMIN 3.9 10/09/2020   ALKPHOS 81 10/09/2020   HCVAB NON REACTIVE 10/09/2020   LIPASE 29 03/19/2015    Electrolytes Lab Results  Component Value Date   NA 138 10/09/2020   K 3.7 10/09/2020   CL 105 10/09/2020   CALCIUM 8.9 10/09/2020   MG 2.2 10/09/2020   PHOS 3.7 10/09/2020    Bone Lab Results  Component Value Date   VD25OH 74.7 05/07/2019   VD125OH2TOT 30.8 10/26/2015    Inflammation (CRP: Acute Phase) (ESR: Chronic Phase) Lab Results  Component Value Date   CRP 0.9 05/07/2019   ESRSEDRATE 41 (H) 05/07/2019   LATICACIDVEN 1.9 10/09/2020         Note: Above Lab results reviewed.  Recent Imaging Review  DG Shoulder Left CLINICAL DATA:  Left shoulder pain over the last few days which increases with movement. Tender to palpation. Initial encounter.  EXAM: LEFT SHOULDER - 2+ VIEW  COMPARISON:  None.  FINDINGS: The left shoulder is located. No acute abnormality is present. Calcification anterior to the proximal humerus on the axillary view may be within the tendon. Visualized hemithorax is clear.  IMPRESSION: 1. No acute abnormality. 2. Question calcific tendinitis along the  deltoid.  Electronically Signed   By: San Morelle M.D.   On: 02/12/2019 07:48 Note: Reviewed        Physical Exam  General appearance: Well nourished, well developed, and well hydrated. In no apparent acute distress Mental status: Alert, oriented x 3 (person, place, & time)       Respiratory: No evidence of acute respiratory distress Eyes: PERLA Vitals: BP 123/65   Pulse 82   Temp (!) 97 F (36.1 C)   Resp 16   Ht '5\' 3"'  (1.6 m)   Wt 165 lb (74.8 kg)   SpO2 94%   BMI 29.23 kg/m  BMI: Estimated body mass index is 29.23 kg/m as calculated from the following:   Height as of this encounter: '5\' 3"'  (1.6 m).   Weight as of this encounter: 165 lb (74.8 kg). Ideal: Ideal body weight: 56.9 kg (125 lb 7.1 oz) Adjusted ideal body weight: 64.1 kg (141 lb 4.2 oz)  Assessment   Status Diagnosis  Controlled Controlled Controlled 1. Chronic pain syndrome   2. Chronic chest wall pain (1ry area of  Pain) (Incisional Midline) (since 04/22/2012)   3. Chronic lower extremity pain (2ry area of Pain) (Bilateral) (R>L)   4. Chronic shoulder pain (3ry area of Pain) (Right)   5. Chronic low back pain (Bilateral) (R>L) w/o sciatica   6. Lumbar facet syndrome (Bilateral) (R>L)   7. Pharmacologic therapy   8. Chronic use of opiate for therapeutic purpose   9. Encounter for medication management      Updated Problems: Problem  Chronic low back pain (Bilateral) (R>L) w/o sciatica    Plan of Care  Problem-specific:  No problem-specific Assessment & Plan notes found for this encounter.  [name] has a current medication list which includes the following long-term medication(s): allopurinol, atorvastatin, [START ON 05/30/2021] hydrocodone-acetaminophen, [START ON 06/29/2021] hydrocodone-acetaminophen, [START ON 07/29/2021] hydrocodone-acetaminophen, insulin detemir, isosorbide mononitrate, linagliptin, metoprolol tartrate, pantoprazole, and gabapentin.  Pharmacotherapy  (Medications Ordered): Meds ordered this encounter  Medications   HYDROcodone-acetaminophen (NORCO/VICODIN) 5-325 MG tablet    Sig: Take 1 tablet by mouth 2 (two) times daily as needed for severe pain. Must last 30 days    Dispense:  60 tablet    Refill:  0    Not a duplicate. Do NOT delete! Dispense 1 day early if closed on refill date. Avoid benzodiazepines within 8 hours of opioids. Do not send refill requests.   HYDROcodone-acetaminophen (NORCO/VICODIN) 5-325 MG tablet    Sig: Take 1 tablet by mouth 2 (two) times daily as needed for severe pain. Must last 30 days    Dispense:  60 tablet    Refill:  0    Not a duplicate. Do NOT delete! Dispense 1 day early if closed on refill date. Avoid benzodiazepines within 8 hours of opioids. Do not send refill requests.   HYDROcodone-acetaminophen (NORCO/VICODIN) 5-325 MG tablet    Sig: Take 1 tablet by mouth 2 (two) times daily as needed for severe pain. Must last 30 days    Dispense:  60 tablet    Refill:  0    Not a duplicate. Do NOT delete! Dispense 1 day early if closed on refill date. Avoid benzodiazepines within 8 hours of opioids. Do not send refill requests.    Orders:  Orders Placed This Encounter  Procedures   [employer] Select 13 (MW), Urine    Volume: 30 ml(s). Minimum 3 ml of urine is needed. Document temperature of fresh sample. Indications: Long term (current) use of opiate analgesic (Y78.295)    Order Specific Question:   Release to patient    Answer:   Immediate   Nursing Instructions:    1). STAT: UDS required today. 2). Make sure to document all opioids and benzodiazepines taken, including time of last intake. 3). If order is entered on a procedure day, make sure sample is obtained before any medications are administered.   Nursing communication    Scheduling Instructions:     Complete/update the opioid risk tool (ORT) questionnaire.    Follow-up plan:   Return in about 14 weeks (around 08/28/2021) for Eval-day (M,W),  (F2F), (MM).     Interventional management options: Planned, scheduled, and/or pending:   None at this time.   Considering:   Therapeutic bilateral Lumbar facet + bilateral sacroiliac joint RFA #1    Palliative PRN treatment(s):   Palliative left intra-articular hip joint injection #2  Palliative bilateral lumbar facet block #3  Palliative bilateral sacroiliac joint block #2     Recent Visits Date Type Provider Dept  04/25/21 Telemedicine Milinda Pointer, MD  [facility]  Showing recent visits within past 90 days and meeting all other requirements Today's Visits Date Type Provider Dept  05/22/21 Office Visit Milinda Pointer, MD [facility]  Showing today's visits and meeting all other requirements Future Appointments No visits were found meeting these conditions. Showing future appointments within next 90 days and meeting all other requirements I discussed the assessment and treatment plan with the patient. The patient was provided an opportunity to ask questions and all were answered. The patient agreed with the plan and demonstrated an understanding of the instructions.  Patient advised to call back or seek an in-person evaluation if the symptoms or condition worsens.  Duration of encounter: 30 minutes.  Note by: Gaspar Cola, MD Date: 05/22/2021; Time: 3:18 PM

## 2021-05-22 ENCOUNTER — Ambulatory Visit: Payer: Medicare Other | Attending: Pain Medicine | Admitting: Pain Medicine

## 2021-05-22 ENCOUNTER — Other Ambulatory Visit: Payer: Self-pay

## 2021-05-22 ENCOUNTER — Encounter: Payer: Self-pay | Admitting: Pain Medicine

## 2021-05-22 VITALS — BP 123/65 | HR 82 | Temp 97.0°F | Resp 16 | Ht 63.0 in | Wt 165.0 lb

## 2021-05-22 DIAGNOSIS — M545 Low back pain, unspecified: Secondary | ICD-10-CM | POA: Diagnosis present

## 2021-05-22 DIAGNOSIS — M79605 Pain in left leg: Secondary | ICD-10-CM | POA: Diagnosis present

## 2021-05-22 DIAGNOSIS — Z79899 Other long term (current) drug therapy: Secondary | ICD-10-CM | POA: Diagnosis present

## 2021-05-22 DIAGNOSIS — M47816 Spondylosis without myelopathy or radiculopathy, lumbar region: Secondary | ICD-10-CM | POA: Insufficient documentation

## 2021-05-22 DIAGNOSIS — R0789 Other chest pain: Secondary | ICD-10-CM | POA: Diagnosis present

## 2021-05-22 DIAGNOSIS — M25511 Pain in right shoulder: Secondary | ICD-10-CM | POA: Diagnosis present

## 2021-05-22 DIAGNOSIS — G894 Chronic pain syndrome: Secondary | ICD-10-CM | POA: Insufficient documentation

## 2021-05-22 DIAGNOSIS — G8929 Other chronic pain: Secondary | ICD-10-CM | POA: Diagnosis present

## 2021-05-22 DIAGNOSIS — Z79891 Long term (current) use of opiate analgesic: Secondary | ICD-10-CM | POA: Insufficient documentation

## 2021-05-22 DIAGNOSIS — M79604 Pain in right leg: Secondary | ICD-10-CM | POA: Insufficient documentation

## 2021-05-22 MED ORDER — HYDROCODONE-ACETAMINOPHEN 5-325 MG PO TABS: 1.0000 | ORAL_TABLET | Freq: Two times a day (BID) | ORAL | 0 refills | Status: DC | PRN

## 2021-05-22 NOTE — Patient Instructions (Signed)
____________________________________________________________________________________________  Medication Rules  Purpose: To inform patients, and their family members, of our rules and regulations.  Applies to: All patients receiving prescriptions (written or electronic).  Pharmacy of record: Pharmacy where electronic prescriptions will be sent. If written prescriptions are taken to a different pharmacy, please inform the nursing staff. The pharmacy listed in the electronic medical record should be the one where you would like electronic prescriptions to be sent.  Electronic prescriptions: In compliance with the [place]rengthen Opioid Misuse Prevention (STOP) Act of 2017 (Session Law 2017-74/H243), effective September 03, 2018, all controlled substances must be electronically prescribed. Calling prescriptions to the pharmacy will cease to exist.  Prescription refills: Only during scheduled appointments. Applies to all prescriptions.  NOTE: The following applies primarily to controlled substances (Opioid* Pain Medications).   Type of encounter (visit): For patients receiving controlled substances, face-to-face visits are required. (Not an option or up to the patient.)  Patient's responsibilities: Pain Pills: Bring all pain pills to every appointment (except for procedure appointments). Pill Bottles: Bring pills in original pharmacy bottle. Always bring the newest bottle. Bring bottle, even if empty. Medication refills: You are responsible for knowing and keeping track of what medications you take and those you need refilled. The day before your appointment: write a list of all prescriptions that need to be refilled. The day of the appointment: give the list to the admitting nurse. Prescriptions will be written only during appointments. No prescriptions will be written on procedure days. If you forget a medication: it will not be "Called in", "Faxed", or "electronically sent". You will  need to get another appointment to get these prescribed. No early refills. Do not call asking to have your prescription filled early. Prescription Accuracy: You are responsible for carefully inspecting your prescriptions before leaving our office. Have the discharge nurse carefully go over each prescription with you, before taking them home. Make sure that your name is accurately spelled, that your address is correct. Check the name and dose of your medication to make sure it is accurate. Check the number of pills, and the written instructions to make sure they are clear and accurate. Make sure that you are given enough medication to last until your next medication refill appointment. Taking Medication: Take medication as prescribed. When it comes to controlled substances, taking less pills or less frequently than prescribed is permitted and encouraged. Never take more pills than instructed. Never take medication more frequently than prescribed.  Inform other Doctors: Always inform, all of your healthcare providers, of all the medications you take. Pain Medication from other Providers: You are not allowed to accept any additional pain medication from any other Doctor or Healthcare provider. There are two exceptions to this rule. (see below) In the event that you require additional pain medication, you are responsible for notifying us, as stated below. Cough Medicine: Often these contain an opioid, such as codeine or hydrocodone. Never accept or take cough medicine containing these opioids if you are already taking an opioid* medication. The combination may cause respiratory failure and death. Medication Agreement: You are responsible for carefully reading and following our Medication Agreement. This must be signed before receiving any prescriptions from our practice. Safely store a copy of your signed Agreement. Violations to the Agreement will result in no further prescriptions. (Additional copies of our  Medication Agreement are available upon request.) Laws, Rules, & Regulations: All patients are expected to follow all Federal and State Laws, Statutes, Rules, & Regulations. Ignorance of   the Laws does not constitute a valid excuse.  Illegal drugs and Controlled Substances: The use of illegal substances (including, but not limited to marijuana and its derivatives) and/or the illegal use of any controlled substances is strictly prohibited. Violation of this rule may result in the immediate and permanent discontinuation of any and all prescriptions being written by our practice. The use of any illegal substances is prohibited. Adopted CDC guidelines & recommendations: Target dosing levels will be at or below 60 MME/day. Use of benzodiazepines** is not recommended.  Exceptions: There are only two exceptions to the rule of not receiving pain medications from other Healthcare Providers. Exception #1 (Emergencies): In the event of an emergency (i.e.: accident requiring emergency care), you are allowed to receive additional pain medication. However, you are responsible for: As soon as you are able, call our office [phone], at any time of the day or night, and leave a message stating your name, the date and nature of the emergency, and the name and dose of the medication prescribed. In the event that your call is answered by a member of our staff, make sure to document and save the date, time, and the name of the person that took your information.  Exception #2 (Planned Surgery): In the event that you are scheduled by another doctor or dentist to have any type of surgery or procedure, you are allowed (for a period no longer than 30 days), to receive additional pain medication, for the acute post-op pain. However, in this case, you are responsible for picking up a copy of our "Post-op Pain Management for Surgeons" handout, and giving it to your surgeon or dentist. This document is available at our office, and  does not require an appointment to obtain it. Simply go to our office during business hours (Monday-Thursday from 8:00 AM to 4:00 PM) (Friday 8:00 AM to 12:00 Noon) or if you have a scheduled appointment with us, prior to your surgery, and ask for it by name. In addition, you are responsible for: calling our office [phone], at any time of the day or night, and leaving a message stating your name, name of your surgeon, type of surgery, and date of procedure or surgery. Failure to comply with your responsibilities may result in termination of therapy involving the controlled substances.  *Opioid medications include: morphine, codeine, oxycodone, oxymorphone, hydrocodone, hydromorphone, meperidine, tramadol, tapentadol, buprenorphine, fentanyl, methadone. **Benzodiazepine medications include: diazepam (Valium), alprazolam (Xanax), clonazepam (Klonopine), lorazepam (Ativan), clorazepate (Tranxene), chlordiazepoxide (Librium), estazolam (Prosom), oxazepam (Serax), temazepam (Restoril), triazolam (Halcion) (Last updated: 08/01/2020) ____________________________________________________________________________________________  ____________________________________________________________________________________________  Medication Recommendations and Reminders  Applies to: All patients receiving prescriptions (written and/or electronic).  Medication Rules & Regulations: These rules and regulations exist for your safety and that of others. They are not flexible and neither are we. Dismissing or ignoring them will be considered "non-compliance" with medication therapy, resulting in complete and irreversible termination of such therapy. (See document titled "Medication Rules" for more details.) In all conscience, because of safety reasons, we cannot continue providing a therapy where the patient does not follow instructions.  Pharmacy of record:  Definition: This is the pharmacy where your electronic  prescriptions will be sent.  We do not endorse any particular pharmacy, however, we have experienced problems with [facility] not securing enough medication supply for the community. We do not restrict you in your choice of pharmacy. However, once we write for your prescriptions, we will NOT be re-sending more prescriptions to fix restricted supply problems   created by your pharmacy, or your insurance.  The pharmacy listed in the electronic medical record should be the one where you want electronic prescriptions to be sent. If you choose to change pharmacy, simply notify our nursing staff.  Recommendations: Keep all of your pain medications in a safe place, under lock and key, even if you live alone. We will NOT replace lost, stolen, or damaged medication. After you fill your prescription, take 1 week's worth of pills and put them away in a safe place. You should keep a separate, properly labeled bottle for this purpose. The remainder should be kept in the original bottle. Use this as your primary supply, until it runs out. Once it's gone, then you know that you have 1 week's worth of medicine, and it is time to come in for a prescription refill. If you do this correctly, it is unlikely that you will ever run out of medicine. To make sure that the above recommendation works, it is very important that you make sure your medication refill appointments are scheduled at least 1 week before you run out of medicine. To do this in an effective manner, make sure that you do not leave the office without scheduling your next medication management appointment. Always ask the nursing staff to show you in your prescription , when your medication will be running out. Then arrange for the receptionist to get you a return appointment, at least 7 days before you run out of medicine. Do not wait until you have 1 or 2 pills left, to come in. This is very poor planning and does not take into consideration that we may need to  cancel appointments due to bad weather, sickness, or emergencies affecting our staff. DO NOT ACCEPT A "Partial Fill": If for any reason your pharmacy does not have enough pills/tablets to completely fill or refill your prescription, do not allow for a "partial fill". The law allows the pharmacy to complete that prescription within 72 hours, without requiring a new prescription. If they do not fill the rest of your prescription within those 72 hours, you will need a separate prescription to fill the remaining amount, which we will NOT provide. If the reason for the partial fill is your insurance, you will need to talk to the pharmacist about payment alternatives for the remaining tablets, but again, DO NOT ACCEPT A PARTIAL FILL, unless you can trust your pharmacist to obtain the remainder of the pills within 72 hours.  Prescription refills and/or changes in medication(s):  Prescription refills, and/or changes in dose or medication, will be conducted only during scheduled medication management appointments. (Applies to both, written and electronic prescriptions.) No refills on procedure days. No medication will be changed or started on procedure days. No changes, adjustments, and/or refills will be conducted on a procedure day. Doing so will interfere with the diagnostic portion of the procedure. No phone refills. No medications will be "called into the pharmacy". No Fax refills. No weekend refills. No Holliday refills. No after hours refills.  Remember:  Business hours are:  Monday to Thursday 8:00 AM to 4:00 PM Provider's Schedule: Briah Nary, MD - Appointments are:  Medication management: Monday and Wednesday 8:00 AM to 4:00 PM Procedure day: Tuesday and Thursday 7:30 AM to 4:00 PM Bilal Lateef, MD - Appointments are:  Medication management: Tuesday and Thursday 8:00 AM to 4:00 PM Procedure day: Monday and Wednesday 7:30 AM to 4:00 PM (Last update:  03/23/2020) ____________________________________________________________________________________________  ____________________________________________________________________________________________  CBD (cannabidiol) WARNING    Applicable to: All individuals currently taking or considering taking CBD (cannabidiol) and, more important, all patients taking opioid analgesic controlled substances (pain medication). (Example: oxycodone; oxymorphone; hydrocodone; hydromorphone; morphine; methadone; tramadol; tapentadol; fentanyl; buprenorphine; butorphanol; dextromethorphan; meperidine; codeine; etc.)  Legal status: CBD remains a Schedule I drug prohibited for any use. CBD is illegal with one exception. In the [place], CBD has a limited Food and Drug Administration (FDA) approval for the treatment of two specific types of epilepsy disorders. Only one CBD product has been approved by the FDA for this purpose: "Epidiolex". FDA is aware that some companies are marketing products containing cannabis and cannabis-derived compounds in ways that violate the Federal Food, Drug and Cosmetic Act (FD&C Act) and that may put the health and safety of consumers at risk. The FDA, a Federal agency, has not enforced the CBD status since 2018.   Legality: Some manufacturers ship CBD products nationally, which is illegal. Often such products are sold online and are therefore available throughout the country. CBD is openly sold in head shops and health food stores in some states where such sales have not been explicitly legalized. Selling unapproved products with unsubstantiated therapeutic claims is not only a violation of the law, but also can put patients at risk, as these products have not been proven to be safe or effective. Federal illegality makes it difficult to conduct research on CBD.  Reference: "FDA Regulation of Cannabis and Cannabis-Derived Products, Including Cannabidiol (CBD)" -  https://www.fda.gov/news-events/public-health-focus/fda-regulation-cannabis-and-cannabis-derived-products-including-cannabidiol-cbd  Warning: CBD is not FDA approved and has not undergo the same manufacturing controls as prescription drugs.  This means that the purity and safety of available CBD may be questionable. Most of the time, despite manufacturer's claims, it is contaminated with THC (delta-9-tetrahydrocannabinol - the chemical in marijuana responsible for the "HIGH").  When this is the case, the THC contaminant will trigger a positive urine drug screen (UDS) test for Marijuana (carboxy-THC). Because a positive UDS for any illicit substance is a violation of our medication agreement, your opioid analgesics (pain medicine) may be permanently discontinued.  MORE ABOUT CBD  General Information: CBD  is a derivative of the Marijuana (cannabis sativa) plant discovered in 1940. It is one of the 113 identified substances found in Marijuana. It accounts for up to 40% of the plant's extract. As of 2018, preliminary clinical studies on CBD included research for the treatment of anxiety, movement disorders, and pain. CBD is available and consumed in multiple forms, including inhalation of smoke or vapor, as an aerosol spray, and by mouth. It may be supplied as an oil containing CBD, capsules, dried cannabis, or as a liquid solution. CBD is thought not to be as psychoactive as THC (delta-9-tetrahydrocannabinol - the chemical in marijuana responsible for the "HIGH"). Studies suggest that CBD may interact with different biological target receptors in the body, including cannabinoid and other neurotransmitter receptors. As of 2018 the mechanism of action for its biological effects has not been determined.  Side-effects  Adverse reactions: Dry mouth, diarrhea, decreased appetite, fatigue, drowsiness, malaise, weakness, sleep disturbances, and others.  Drug interactions: CBC may interact with other medications  such as blood-thinners. (Last update: 04/09/2020) ____________________________________________________________________________________________  ____________________________________________________________________________________________  Drug Holidays (Slow)  What is a "Drug Holiday"? Drug Holiday: is the name given to the period of time during which a patient stops taking a medication(s) for the purpose of eliminating tolerance to the drug.  Benefits Improved effectiveness of opioids. Decreased opioid dose needed to achieve benefits. Improved pain with lesser dose.    What is tolerance? Tolerance: is the progressive decreased in effectiveness of a drug due to its repetitive use. With repetitive use, the body gets use to the medication and as a consequence, it loses its effectiveness. This is a common problem seen with opioid pain medications. As a result, a larger dose of the drug is needed to achieve the same effect that used to be obtained with a smaller dose.  How long should a "Drug Holiday" last? You should stay off of the pain medicine for at least 14 consecutive days. (2 weeks)  Should I stop the medicine "cold turkey"? No. You should always coordinate with your Pain Specialist so that he/she can provide you with the correct medication dose to make the transition as smoothly as possible.  How do I stop the medicine? Slowly. You will be instructed to decrease the daily amount of pills that you take by one (1) pill every seven (7) days. This is called a "slow downward taper" of your dose. For example: if you normally take four (4) pills per day, you will be asked to drop this dose to three (3) pills per day for seven (7) days, then to two (2) pills per day for seven (7) days, then to one (1) per day for seven (7) days, and at the end of those last seven (7) days, this is when the "Drug Holiday" would start.   Will I have withdrawals? By doing a "slow downward taper" like this one, it  is unlikely that you will experience any significant withdrawal symptoms. Typically, what triggers withdrawals is the sudden stop of a high dose opioid therapy. Withdrawals can usually be avoided by slowly decreasing the dose over a prolonged period of time. If you do not follow these instructions and decide to stop your medication abruptly, withdrawals may be possible.  What are withdrawals? Withdrawals: refers to the wide range of symptoms that occur after stopping or dramatically reducing opiate drugs after heavy and prolonged use. Withdrawal symptoms do not occur to patients that use low dose opioids, or those who take the medication sporadically. Contrary to benzodiazepine (example: Valium, Xanax, etc.) or alcohol withdrawals ("Delirium Tremens"), opioid withdrawals are not lethal. Withdrawals are the physical manifestation of the body getting rid of the excess receptors.  Expected Symptoms Early symptoms of withdrawal may include: Agitation Anxiety Muscle aches Increased tearing Insomnia Runny nose Sweating Yawning  Late symptoms of withdrawal may include: Abdominal cramping Diarrhea Dilated pupils Goose bumps Nausea Vomiting  Will I experience withdrawals? Due to the slow nature of the taper, it is very unlikely that you will experience any.  What is a slow taper? Taper: refers to the gradual decrease in dose.  (Last update: 03/23/2020) ____________________________________________________________________________________________    

## 2021-05-22 NOTE — Progress Notes (Signed)
Nursing Pain Medication Assessment:  Safety precautions to be maintained throughout the outpatient stay will include: orient to surroundings, keep bed in low position, maintain call bell within reach at all times, provide assistance with transfer out of bed and ambulation.  Medication Inspection Compliance: Pill count conducted under aseptic conditions, in front of the patient. Neither the pills nor the bottle was removed from the patient's sight at any time. Once count was completed pills were immediately returned to the patient in their original bottle.  Medication: Hydrocodone/APAP Pill/Patch Count:  17 of 60 pills remain Pill/Patch Appearance: Markings consistent with prescribed medication Bottle Appearance: Standard pharmacy container. Clearly labeled. Filled Date: 8 / 59 / 2022 Last Medication intake:  Today Safety precautions to be maintained throughout the outpatient stay will include: orient to surroundings, keep bed in low position, maintain call bell within reach at all times, provide assistance with transfer out of bed and ambulation.

## 2021-05-26 LAB — TOXASSURE SELECT 13 (MW), URINE

## 2021-07-16 ENCOUNTER — Other Ambulatory Visit: Payer: Self-pay

## 2021-07-16 ENCOUNTER — Encounter: Payer: Self-pay | Admitting: Emergency Medicine

## 2021-07-16 ENCOUNTER — Emergency Department: Payer: Medicare Other

## 2021-07-16 ENCOUNTER — Emergency Department
Admission: EM | Admit: 2021-07-16 | Discharge: 2021-07-16 | Disposition: A | Discharge status: Home / Self Care | Payer: Medicare Other | Attending: Emergency Medicine | Admitting: Emergency Medicine

## 2021-07-16 DIAGNOSIS — Z79899 Other long term (current) drug therapy: Secondary | ICD-10-CM | POA: Insufficient documentation

## 2021-07-16 DIAGNOSIS — R1012 Left upper quadrant pain: Secondary | ICD-10-CM | POA: Diagnosis not present

## 2021-07-16 DIAGNOSIS — R0789 Other chest pain: Secondary | ICD-10-CM | POA: Diagnosis not present

## 2021-07-16 DIAGNOSIS — Z7982 Long term (current) use of aspirin: Secondary | ICD-10-CM | POA: Diagnosis not present

## 2021-07-16 DIAGNOSIS — Z87891 Personal history of nicotine dependence: Secondary | ICD-10-CM | POA: Insufficient documentation

## 2021-07-16 DIAGNOSIS — I2510 Atherosclerotic heart disease of native coronary artery without angina pectoris: Secondary | ICD-10-CM | POA: Insufficient documentation

## 2021-07-16 DIAGNOSIS — J449 Chronic obstructive pulmonary disease, unspecified: Secondary | ICD-10-CM | POA: Insufficient documentation

## 2021-07-16 DIAGNOSIS — R079 Chest pain, unspecified: Secondary | ICD-10-CM | POA: Diagnosis present

## 2021-07-16 DIAGNOSIS — N183 Chronic kidney disease, stage 3 unspecified: Secondary | ICD-10-CM | POA: Diagnosis not present

## 2021-07-16 DIAGNOSIS — R0602 Shortness of breath: Secondary | ICD-10-CM | POA: Insufficient documentation

## 2021-07-16 DIAGNOSIS — Z794 Long term (current) use of insulin: Secondary | ICD-10-CM | POA: Insufficient documentation

## 2021-07-16 DIAGNOSIS — R202 Paresthesia of skin: Secondary | ICD-10-CM | POA: Diagnosis not present

## 2021-07-16 DIAGNOSIS — E1122 Type 2 diabetes mellitus with diabetic chronic kidney disease: Secondary | ICD-10-CM | POA: Insufficient documentation

## 2021-07-16 DIAGNOSIS — R42 Dizziness and giddiness: Secondary | ICD-10-CM | POA: Diagnosis not present

## 2021-07-16 DIAGNOSIS — Z7984 Long term (current) use of oral hypoglycemic drugs: Secondary | ICD-10-CM | POA: Insufficient documentation

## 2021-07-16 DIAGNOSIS — Z951 Presence of aortocoronary bypass graft: Secondary | ICD-10-CM | POA: Insufficient documentation

## 2021-07-16 DIAGNOSIS — R200 Anesthesia of skin: Secondary | ICD-10-CM | POA: Diagnosis not present

## 2021-07-16 DIAGNOSIS — I129 Hypertensive chronic kidney disease with stage 1 through stage 4 chronic kidney disease, or unspecified chronic kidney disease: Secondary | ICD-10-CM | POA: Diagnosis not present

## 2021-07-16 DIAGNOSIS — R531 Weakness: Secondary | ICD-10-CM | POA: Diagnosis not present

## 2021-07-16 LAB — HEPATIC FUNCTION PANEL
ALT: 71 U/L — ABNORMAL HIGH (ref 0–44)
AST: 76 U/L — ABNORMAL HIGH (ref 15–41)
Albumin: 4.4 g/dL (ref 3.5–5.0)
Alkaline Phosphatase: 75 U/L (ref 38–126)
Bilirubin, Direct: 0.2 mg/dL (ref 0.0–0.2)
Indirect Bilirubin: 1.3 mg/dL — ABNORMAL HIGH (ref 0.3–0.9)
Total Bilirubin: 1.5 mg/dL — ABNORMAL HIGH (ref 0.3–1.2)
Total Protein: 8.3 g/dL — ABNORMAL HIGH (ref 6.5–8.1)

## 2021-07-16 LAB — TROPONIN I (HIGH SENSITIVITY)
Troponin I (High Sensitivity): 8 ng/L (ref <18)
Troponin I (High Sensitivity): 9 ng/L (ref <18)

## 2021-07-16 LAB — CBC
HCT: 39.7 % (ref 39.0–52.0)
Hemoglobin: 14.4 g/dL (ref 13.0–17.0)
MCH: 31 pg (ref 26.0–34.0)
MCHC: 36.3 g/dL — ABNORMAL HIGH (ref 30.0–36.0)
MCV: 85.4 fL (ref 80.0–100.0)
Platelets: 188 10*3/uL (ref 150–400)
RBC: 4.65 MIL/uL (ref 4.22–5.81)
RDW: 12.4 % (ref 11.5–15.5)
WBC: 9.6 10*3/uL (ref 4.0–10.5)
nRBC: 0 % (ref 0.0–0.2)

## 2021-07-16 LAB — BASIC METABOLIC PANEL
Anion gap: 15 (ref 5–15)
BUN: 20 mg/dL (ref 8–23)
CO2: 22 mmol/L (ref 22–32)
Calcium: 9.6 mg/dL (ref 8.9–10.3)
Chloride: 101 mmol/L (ref 98–111)
Creatinine, Ser: 1.27 mg/dL — ABNORMAL HIGH (ref 0.61–1.24)
GFR, Estimated: >60 mL/min (ref >60)
Glucose, Bld: 170 mg/dL — ABNORMAL HIGH (ref 70–99)
Potassium: 3.6 mmol/L (ref 3.5–5.1)
Sodium: 138 mmol/L (ref 135–145)

## 2021-07-16 LAB — LIPASE, BLOOD: Lipase: 39 U/L (ref 11–51)

## 2021-07-16 MED ORDER — ALUM & MAG HYDROXIDE-SIMETH 200-200-20 MG/5ML PO SUSP
15.0000 mL | Freq: Once | ORAL | Status: AC
  Administered 2021-07-16: 15 mL via ORAL
  Filled 2021-07-16: qty 30

## 2021-07-16 MED ORDER — MORPHINE SULFATE (PF) 4 MG/ML IV SOLN
4.0000 mg | Freq: Once | INTRAVENOUS | Status: AC
  Administered 2021-07-16: 4 mg via INTRAVENOUS
  Filled 2021-07-16: qty 1

## 2021-07-16 MED ORDER — LIDOCAINE VISCOUS HCL 2 % MT SOLN
15.0000 mL | Freq: Once | OROMUCOSAL | Status: AC
  Administered 2021-07-16: 15 mL via ORAL
  Filled 2021-07-16: qty 15

## 2021-07-16 MED ORDER — IOHEXOL 350 MG/ML SOLN
100.0000 mL | Freq: Once | INTRAVENOUS | Status: AC | PRN
  Administered 2021-07-16: 100 mL via INTRAVENOUS

## 2021-07-16 NOTE — ED Notes (Signed)
Patient transported to X-ray 

## 2021-07-16 NOTE — ED Provider Notes (Signed)
[facility] Emergency Department Provider Note   ____________________________________________   Event Date/Time   First MD Initiated Contact with Patient 07/16/21 1304     (approximate)  I have reviewed the triage vital signs and the nursing notes.   HISTORY  Chief Complaint Chest Pain, Shortness of Breath, and Weakness    HPI [name] [name]. is a 65 y.o. male with past medical history of hypertension, hyperlipidemia, diabetes, CKD, CAD status post CABG, COPD, alcohol abuse, and chronic pain syndrome who presents to the ED complaining of chest pain.  Patient reports sudden onset of severe pain in his left lower chest about 2 hours prior to arrival.  Pain has been coming in waves since then, will be severe for a few seconds at a time and then abates but remains present.  Pain does not radiate anywhere, is described as sharp.  He reports sensation of numbness and tingling in both arms, denies any associated weakness.  He has had some mild difficulty breathing and dizziness, denies any fevers, cough, pain or swelling in his legs.  He was given loading dose of aspirin and 2 sublingual nitro by EMS prior to arrival without significant relief.        Past Medical History:  Diagnosis Date   Benign esophageal stricture    Cardiomyopathy, secondary ([place])    Coronary artery disease    Diabetes mellitus without complication ([place])    Diverticulitis    GI bleed    Gout    Hemorrhoids    Hyperlipidemia    Hypertension    Sleep apnea    Tubular adenoma of colon     Patient Active Problem List   Diagnosis Date Noted   Chronic use of opiate for therapeutic purpose 40/06/2724   Uncomplicated opioid dependence ([place]) 10/31/2020   Lower GI bleed 10/09/2020   DM2 (diabetes mellitus, type 2) ([place]) 10/09/2020   CKD (chronic kidney disease), stage III ([place]) 10/09/2020   Elevated LFTs 10/09/2020   CKD stage 2 due to type 2 diabetes mellitus ([place])    Pharmacologic  therapy 05/04/2019   Disorder of skeletal system 05/04/2019   Problems influencing health status 05/04/2019   Osteoarthritis of hip (Left) 05/26/2018   Spondylosis without myelopathy or radiculopathy, lumbosacral region 05/25/2018   Other specified dorsopathies, sacral and sacrococcygeal region 05/25/2018   Chronic low back pain (Bilateral) (R>L) w/o sciatica 05/25/2018   DDD (degenerative disc disease), lumbosacral 05/13/2018   Lumbar facet arthropathy (Multilevel) (Bilateral) 05/13/2018   Grade 1 Anterolisthesis of L4 on L5 05/13/2018   Chronic pain syndrome 08/16/2016   Pneumonia 05/28/2016   Pressure injury of skin 05/28/2016   Opioid-induced constipation (OIC) 05/17/2016   Hypokalemia 05/01/2016   Brachial plexus injury, right 04/09/2016   Carotid atherosclerosis 04/04/2016   Hypoalbuminemia 04/04/2016   Right sided weakness 04/02/2016   S/P CABG (coronary artery bypass graft) 03/30/2016   Chest pain 02/29/2016   Unstable angina ([place]) 02/24/2016   Keloid skin disorder 01/17/2016   Costochondritis 01/17/2016   Long term current use of opiate analgesic 12/23/2015   Long term prescription opiate use 12/23/2015   Vitamin D insufficiency 11/21/2015   Lumbar facet syndrome (Bilateral) (R>L) 11/21/2015   Lumbar spondylosis 11/21/2015   Elevated sedimentation rate 11/01/2015   Chronic shoulder pain (3ry area of Pain) (Right) 10/25/2015   Chronic hip pain (Left) 10/25/2015   History of alcoholism ([place]) 10/25/2015   Opiate use 10/25/2015   Encounter for therapeutic drug level monitoring 10/25/2015  Chronic chest wall pain (1ry area of Pain) ([place]) (since 04/22/2012) 10/25/2015   Chronic lower extremity pain (2ry area of Pain) (Bilateral) (R>L) 10/25/2015   History of MI (myocardial infarction) (January 2014) 10/25/2015   Encounter for pain management planning 10/25/2015   Chronic sacroiliac joint pain (Bilateral) (L>R) 10/25/2015   Neuropathic pain 10/25/2015    Neurogenic pain 10/25/2015   Incisional pain 10/25/2015   Hyperlipidemia 03/16/2014   H/O coronary artery bypass surgery 05/12/2012   Alcohol withdrawal syndrome ([place]) 04/28/2012   Injury of kidney 04/25/2012   OSA (obstructive sleep apnea) 04/22/2012   Essential hypertension 04/22/2012   Coronary artery disease 04/22/2012   Apnea, sleep 04/21/2012   Chronic obstructive pulmonary disease ([place]) 04/21/2012   COPD (chronic obstructive pulmonary disease) ([place]) 04/21/2012    Past Surgical History:  Procedure Laterality Date   cardiac bypass     CARDIAC CATHETERIZATION Left 02/03/2016   Procedure: Left Heart Cath and Coronary Angiography;  Surgeon: Teodoro Spray, MD;  Location: [facility];  Service: Cardiovascular;  Laterality: Left;   CARDIAC SURGERY     COLONOSCOPY WITH PROPOFOL N/A 04/29/2015   Procedure: COLONOSCOPY WITH PROPOFOL;  Surgeon: Hulen Luster, MD;  Location: [facility] ENDOSCOPY;  Service: Gastroenterology;  Laterality: N/A;   COLONOSCOPY WITH PROPOFOL N/A 07/20/2020   Procedure: COLONOSCOPY WITH PROPOFOL;  Surgeon: Tol[place], Benay Pike, MD;  Location: [facility] ENDOSCOPY;  Service: Gastroenterology;  Laterality: N/A;   CORONARY ANGIOPLASTY WITH STENT PLACEMENT     x2   CORONARY ARTERY BYPASS GRAFT     2013 & 2017   ESOPHAGOGASTRODUODENOSCOPY (EGD) WITH PROPOFOL N/A 04/29/2015   Procedure: ESOPHAGOGASTRODUODENOSCOPY (EGD) WITH PROPOFOL;  Surgeon: Hulen Luster, MD;  Location: [facility] ENDOSCOPY;  Service: Gastroenterology;  Laterality: N/A;   SEPTOPLASTY      Prior to Admission medications   Medication Sig Start Date End Date Taking? Authorizing Provider  allopurinol (ZYLOPRIM) 100 MG tablet Take 100 mg by mouth daily.  12/25/18   [provider]  aspirin EC 81 MG tablet Take 81 mg by mouth daily. Swallow whole.    [provider]  atorvastatin (LIPITOR) 40 MG tablet Take 40 mg by mouth at bedtime.     [provider]  B-D UF III MINI PEN NEEDLES 31G X 5 MM MISC  U WITH INSULIN PEN INJECTIONS D 12/30/18   [provider]  Cholecalciferol 25 MCG (1000 UT) tablet Take 2,000 Units by mouth daily.    [provider]  gabapentin (NEURONTIN) 600 MG tablet Take 1 tablet (600 mg total) by mouth 2 (two) times daily. 08/03/20 04/24/21  Milinda Pointer, MD  hydrochlorothiazide (HYDRODIURIL) 25 MG tablet Take 25 mg by mouth daily. 01/23/21   [provider]  HYDROcodone-acetaminophen (NORCO/VICODIN) 5-325 MG tablet Take 1 tablet by mouth 2 (two) times daily as needed for severe pain. Must last 30 days 05/30/21 06/29/21  Milinda Pointer, MD  HYDROcodone-acetaminophen (NORCO/VICODIN) 5-325 MG tablet Take 1 tablet by mouth 2 (two) times daily as needed for severe pain. Must last 30 days 06/29/21 07/29/21  Milinda Pointer, MD  HYDROcodone-acetaminophen (NORCO/VICODIN) 5-325 MG tablet Take 1 tablet by mouth 2 (two) times daily as needed for severe pain. Must last 30 days 07/29/21 08/28/21  Milinda Pointer, MD  insulin detemir (LEVEMIR) 100 UNIT/ML FlexPen Inject 30 Units into the skin at bedtime. Patient taking differently: Inject 46 Units into the skin 2 (two) times daily. 10/09/20   Loletha Grayer, MD  isosorbide mononitrate (IMDUR) 30  MG 24 hr tablet Take 30 mg by mouth every morning.     [provider]  linagliptin (TRADJENTA) 5 MG TABS tablet Take 5 mg by mouth daily.    [provider]  losartan (COZAAR) 100 MG tablet Take 100 mg by mouth at bedtime. 08/02/16   [provider]  metoprolol tartrate (LOPRESSOR) 50 MG tablet Take 1 tablet (50 mg total) by mouth 2 (two) times daily. 10/09/20   Loletha Grayer, MD  Multiple Vitamin (MULTIVITAMIN WITH MINERALS) TABS tablet Take 1 tablet by mouth every evening.    [provider]  nitroGLYCERIN (NITROSTAT) 0.4 MG SL tablet Place 0.4 mg under the tongue every 5 (five) minutes x 3 doses as needed for chest pain.  03/07/16   [provider]  OZEMPIC, 0.25  OR 0.5 MG/DOSE, 2 MG/1.5ML SOPN Inject 0.5 mg into the skin once a week. 10/10/20   [provider]  pantoprazole (PROTONIX) 40 MG tablet Take 1 tablet (40 mg total) by mouth daily. 10/09/20 10/09/21  Loletha Grayer, MD  Potassium 99 MG TABS Take 1 tablet by mouth daily.    [provider]    Allergies Patient has no known allergies.  Family History  Problem Relation Age of Onset   Heart disease Mother    Hypertension Father     Social History Social History   Tobacco Use   Smoking status: Former    Years: 25.00    Types: Cigarettes   Smokeless tobacco: Former  Substance Use Topics   Alcohol use: Yes    Comment: occasional   Drug use: No    Review of Systems  Constitutional: No fever/chills Eyes: No visual changes. ENT: No sore throat. Cardiovascular: Positive for chest pain. Respiratory: Positive for shortness of breath.  Negative for cough. Gastrointestinal: No abdominal pain.  No nausea, no vomiting.  No diarrhea.  No constipation. Genitourinary: Negative for dysuria. Musculoskeletal: Negative for back pain. Skin: Negative for rash. Neurological: Negative for headaches or focal weakness, positive for arm numbness and tingling.  ____________________________________________   PHYSICAL EXAM:  VITAL SIGNS: ED Triage Vitals  Enc Vitals Group     BP 07/16/21 1258 (!) 186/84     Pulse Rate 07/16/21 1258 92     Resp 07/16/21 1258 (!) 21     Temp 07/16/21 1258 97.8 F (36.6 C)     Temp Source 07/16/21 1258 Oral     SpO2 07/16/21 1258 100 %     Weight 07/16/21 1256 165 lb (74.8 kg)     Height 07/16/21 1256 5\' 3"  (1.6 m)     Head Circumference --      Peak Flow --      Pain Score 07/16/21 1256 10     Pain Loc --      Pain Edu? --      Excl. in [place]? --     Constitutional: Alert and oriented. Eyes: Conjunctivae are normal. Head: Atraumatic. Nose: No congestion/rhinnorhea. Mouth/Throat: Mucous membranes are moist. Neck: Normal  ROM Cardiovascular: Normal rate, regular rhythm. Grossly normal heart sounds.  2+ radial pulses bilaterally. Respiratory: Normal respiratory effort.  No retractions. Lungs CTAB.  No chest wall tenderness to palpation. Gastrointestinal: Soft and nontender. No distention. Genitourinary: deferred Musculoskeletal: No lower extremity tenderness nor edema. Neurologic:  Normal speech and language. No gross focal neurologic deficits are appreciated. Skin:  Skin is warm, dry and intact. No rash noted. Psychiatric: Mood and affect are normal. Speech and behavior are normal.  ____________________________________________   LABS (all labs ordered are listed, but only abnormal results are displayed)  Labs Reviewed  BASIC METABOLIC PANEL - Abnormal; Notable for the following components:      Result Value   Glucose, Bld 170 (*)    Creatinine, Ser 1.27 (*)    All other components within normal limits  CBC - Abnormal; Notable for the following components:   MCHC 36.3 (*)    All other components within normal limits  HEPATIC FUNCTION PANEL - Abnormal; Notable for the following components:   Total Protein 8.3 (*)    AST 76 (*)    ALT 71 (*)    Total Bilirubin 1.5 (*)    Indirect Bilirubin 1.3 (*)    All other components within normal limits  LIPASE, BLOOD  TROPONIN I (HIGH SENSITIVITY)  TROPONIN I (HIGH SENSITIVITY)   ____________________________________________  EKG  ED ECG REPORT I, Blake Divine, the attending physician, personally viewed and interpreted this ECG.   Date: 07/16/2021  EKG Time: 12:55  Rate: 91  Rhythm: normal sinus rhythm  Axis: LAD  Intervals:right bundle branch block  ST&T Change: None   PROCEDURES  Procedure(s) performed (including Critical Care):  Procedures   ____________________________________________   INITIAL IMPRESSION / ASSESSMENT AND PLAN / ED COURSE      65 year old male with past medical history of hypertension, hyperlipidemia, diabetes,  CKD, CAD status post CABG, COPD, alcohol abuse, and chronic pain syndrome who presents to the ED with severe waxing and waning pain starting in his left lower chest associated with numbness and tingling in both arms.  He denies similar pain in the past, does have significant cardiac history.  EKG shows right bundle branch block similar to previous with no new ischemic changes.  We will check 2 sets of troponin, chest x-ray, and additional labs, treat symptomatically with IV morphine and reassess.  With his severe pain along with numbness and tingling in both arms, we will also check CTA to rule out aortic dissection.  Chest x-ray reviewed by me and shows no infiltrate, edema, or effusion.  2 sets of troponin are negative and CTA is negative for dissection or other acute process to explain patient's symptoms.  Remainder of labs are unremarkable, LFTs and lipase within normal limits.  Pain is slightly improved following IV morphine and on reassessment, patient is primarily complaining of pain in the left upper quadrant of his abdomen.  Symptoms seem extremely atypical for ACS, patient reports taking narcotic pain medication regularly for his chronic pain.  He states that his chronic pain affects both his chest and his legs although pain today was slightly different.  Given reassuring work-up with atypical symptoms, patient is appropriate for discharge home with outpatient cardiology follow-up.  He was counseled to return to the ED for new worsening symptoms, patient agrees with plan.      ____________________________________________   FINAL CLINICAL IMPRESSION(S) / ED DIAGNOSES  Final diagnoses:  Atypical chest pain  Left upper quadrant abdominal pain     ED Discharge Orders     None        Note:  This document was prepared using Dragon voice recognition software and may include unintentional dictation errors.    Blake Divine, MD 07/16/21 [phone]

## 2021-07-16 NOTE — ED Triage Notes (Signed)
Pt in via EMS from home with c/o CP, dizziness and SOB. Pt reports pain to left side that is intermittent. Pt has had MIx's 2, CABG x's 2 and stents x's 2. Pt is HTN but has not taken any meds today. Pt was given 2 sublingual nitro by EMS and took 324mg  of asa prior to their arrival, CBG 176, #20g to right AC.    Pt reports pain all over his chest, intermittent and awful.

## 2021-07-16 NOTE — ED Notes (Signed)
Pt calling ride at this time.  

## 2021-07-16 NOTE — ED Notes (Signed)
ED Provider at bedside. 

## 2021-07-16 NOTE — ED Notes (Signed)
Patient transported to CT 

## 2021-08-18 NOTE — Progress Notes (Signed)
PROVIDER NOTE: Information contained herein reflects review and annotations entered in association with encounter. Interpretation of such information and data should be left to medically-trained personnel. Information provided to patient can be located elsewhere in the medical record under "Patient Instructions". Document created using STT-dictation technology, any transcriptional errors that may result from process are unintentional.    Patient: [name] [name].  Service Category: E/M  Provider: Gaspar Cola, MD  DOB: [birth year]  DOS: 08/21/2021  Specialty: Interventional Pain Management  MRN: [id number]  Setting: Ambulatory outpatient  PCP: Ranae Plumber, PA  Type: Established Patient    Referring Provider: Ranae Plumber, PA  Location: Office  Delivery: Face-to-face     HPI  [name], a 65 y.o. year old male, is here today because of his Chronic pain syndrome [G89.4]. [name] [name] primary complain today is Chest Pain (mid) Last encounter: My last encounter with him was on 05/22/2021. Pertinent problems: [name] [name] has Chronic shoulder pain (3ry area of Pain) (Right); Chronic hip pain (Left); Chronic chest wall pain (1ry area of Pain) (Incisional Midline) (since 04/22/2012); Chronic lower extremity pain (2ry area of Pain) (Bilateral) (R>L); Chronic sacroiliac joint pain (Bilateral) (L>R); Neuropathic pain; Neurogenic pain; Incisional pain; Lumbar facet syndrome (Bilateral) (R>L); Lumbar spondylosis; Keloid skin disorder; Costochondritis; Brachial plexus injury, right; Right sided weakness; Chronic pain syndrome; DDD (degenerative disc disease), lumbosacral; Lumbar facet arthropathy (Multilevel) (Bilateral); 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 Pain Assessment: Severity of Chronic pain is reported as a 4 /10. Location: Chest Mid/Denies. Onset: More than a month ago. Quality: Constant, Pressure, Aching. Timing: Constant. Modifying factor(s): Meds and resting. Vitals:  height is _0  (1.6 m) and weight is 165 lb (74.8 kg). His temperature is 97 F (36.1 C) (abnormal). His blood pressure is 142/79 (abnormal) and his pulse is 77. His respiration is 16 and oxygen saturation is 99%.   Reason for encounter: medication management.   The patient indicates doing well with the current medication regimen. No adverse reactions or side effects reported to the medications.   RTCB: 11/26/2021 Nonopioids transfer 07/25/2020: Gabapentin  Pharmacotherapy Assessment  Analgesic: Hydrocodone/APAP 5/325 mg, 1 tab PO BID (10 mg/day of hydrocodone) MME/day: 10 mg/day.   Monitoring: [place] PMP: PDMP reviewed during this encounter.       Pharmacotherapy: No side-effects or adverse reactions reported. Compliance: No problems identified. Effectiveness: Clinically acceptable.  [name] Fischer, RN  08/21/2021 11:38 AM  Sign when Signing Visit Nursing Pain Medication Assessment:  Safety precautions to be maintained throughout the outpatient stay will include: orient to surroundings, keep bed in low position, maintain call bell within reach at all times, provide assistance with transfer out of bed and ambulation.  Medication Inspection Compliance: Pill count conducted under aseptic conditions, in front of the patient. Neither the pills nor the bottle was removed from the patient's sight at any time. Once count was completed pills were immediately returned to the patient in their original bottle.  Medication: See above Pill/Patch Count:  15 of 60 pills remain Pill/Patch Appearance: Markings consistent with prescribed medication Bottle Appearance: Standard pharmacy container. Clearly labeled. Filled Date: 67 / 26 / 2022 Last Medication intake:   TodaySafety precautions to be maintained throughout the outpatient stay will include: orient to surroundings, keep bed in low  position, maintain call bell within reach at all times, provide assistance with transfer out of bed and ambulation.     UDS:  Summary  Date Value Ref Range Status  05/22/2021 Note  Final    Comment:    ==================================================================== [employer] (MW) ==================================================================== Test                             Result       Flag       Units  Drug Present and Declared for Prescription Verification   Hydrocodone                    1462         EXPECTED   ng/mg creat   Hydromorphone                  360          EXPECTED   ng/mg creat   Dihydrocodeine                 61           EXPECTED   ng/mg creat   Norhydrocodone                 671          EXPECTED   ng/mg creat    Sources of hydrocodone include scheduled prescription medications.    Hydromorphone, dihydrocodeine and norhydrocodone are expected    metabolites of hydrocodone. Hydromorphone and dihydrocodeine are    also available as scheduled prescription medications.  ==================================================================== Test                      Result    Flag   Units      Ref Range   Creatinine              185              mg/dL      >=20 ==================================================================== Declared Medications:  The flagging and interpretation on this report are based on the  following declared medications.  Unexpected results may arise from  inaccuracies in the declared medications.   **Note: The testing scope of this panel includes these medications:   Hydrocodone (Norco)   **Note: The testing scope of this panel does not include the  following reported medications:   Acetaminophen (Norco)  Allopurinol (Zyloprim)  Aspirin  Atorvastatin (Lipitor)  Cholecalciferol  Gabapentin  (Neurontin)  Hydrochlorothiazide (Hydrodiuril)  Insulin (Levemir)  Isosorbide (Imdur)  Linagliptin (Tradjenta)  Losartan (Cozaar)  Metoprolol (Lopressor)  Multivitamin  Nitroglycerin (Nitrostat)  Pantoprazole (Protonix)  Potassium  Semaglutide (Ozempic) ==================================================================== For clinical consultation, please call [phone]. ====================================================================      ROS  Constitutional: Denies any fever or chills Gastrointestinal: No reported hemesis, hematochezia, vomiting, or acute GI distress Musculoskeletal: Denies any acute onset joint swelling, redness, loss of ROM, or weakness Neurological: No reported episodes of acute onset apraxia, aphasia, dysarthria, agnosia, amnesia, paralysis, loss of coordination, or loss of consciousness  Medication Review  Cholecalciferol, HYDROcodone-acetaminophen, Insulin Pen Needle, Potassium, Semaglutide(0.25 or 0.5MG/DOS), allopurinol, aspirin EC, atorvastatin, gabapentin, hydrochlorothiazide, insulin detemir, isosorbide mononitrate, losartan, metoprolol tartrate, multivitamin with minerals, nitroGLYCERIN, and pantoprazole  History Review  Allergy: [name] [name] has No Known Allergies. Drug: [name] [name]  reports no history of drug use. Alcohol:  reports current alcohol use. Tobacco:  reports that he has quit smoking.  His smoking use included cigarettes. He has quit using smokeless tobacco. Social: [name] [name]  reports that he has quit smoking. His smoking use included cigarettes. He has quit using smokeless tobacco. He reports current alcohol use. He reports that he does not use drugs. Medical:  has a past medical history of Benign esophageal stricture, Cardiomyopathy, secondary ([place]), Coronary artery disease, Diabetes mellitus without complication ([place]), Diverticulitis, GI bleed, Gout, Hemorrhoids, Hyperlipidemia, Hypertension, Sleep apnea, and Tubular adenoma of  colon. Surgical: [name] [name]  has a past surgical history that includes Coronary angioplasty with stent; cardiac bypass; Cardiac surgery; Colonoscopy with propofol (N/A, 04/29/2015); Esophagogastroduodenoscopy (egd) with propofol (N/A, 04/29/2015); Septoplasty; Cardiac catheterization (Left, 02/03/2016); Coronary artery bypass graft; and Colonoscopy with propofol (N/A, 07/20/2020). Family: family history includes Heart disease in his mother; Hypertension in his father.  Laboratory Chemistry Profile   Renal Lab Results  Component Value Date   BUN 20 07/16/2021   CREATININE 1.27 (H) 07/16/2021   GFRAA >60 05/07/2019   GFRNONAA >60 07/16/2021    Hepatic Lab Results  Component Value Date   AST 76 (H) 07/16/2021   ALT 71 (H) 07/16/2021   ALBUMIN 4.4 07/16/2021   ALKPHOS 75 07/16/2021   HCVAB NON REACTIVE 10/09/2020   LIPASE 39 07/16/2021    Electrolytes Lab Results  Component Value Date   NA 138 07/16/2021   K 3.6 07/16/2021   CL 101 07/16/2021   CALCIUM 9.6 07/16/2021   MG 2.2 10/09/2020   PHOS 3.7 10/09/2020    Bone Lab Results  Component Value Date   VD25OH 74.7 05/07/2019   VD125OH2TOT 30.8 10/26/2015    Inflammation (CRP: Acute Phase) (ESR: Chronic Phase) Lab Results  Component Value Date   CRP 0.9 05/07/2019   ESRSEDRATE 41 (H) 05/07/2019   LATICACIDVEN 1.9 10/09/2020         Note: Above Lab results reviewed.  Recent Imaging Review  CT Angio Chest/Abd/Pel for Dissection W and/or W/WO CLINICAL DATA:  Chest pain  EXAM: CT ANGIOGRAPHY CHEST, ABDOMEN AND PELVIS  TECHNIQUE: Non-contrast CT of the chest was initially obtained.  Multidetector CT imaging through the chest, abdomen and pelvis was performed using the standard protocol during bolus administration of intravenous contrast. Multiplanar reconstructed images and MIPs were obtained and reviewed to evaluate the vascular anatomy.  CONTRAST:  18m OMNIPAQUE IOHEXOL 350 MG/ML SOLN  COMPARISON:  CT abdomen  and pelvis dated December 04, 2016  FINDINGS: CTA CHEST FINDINGS  Cardiovascular: Normal heart size. No pericardial effusion. Coronary artery calcifications status post CABG. No suspicious central filling defects of the pulmonary arteries.  Mediastinum/Nodes: Esophagus and thyroid are unremarkable. No pathologically enlarged lymph nodes in the chest.  Lungs/Pleura: Atelectasis of the left lower lobe. No consolidation, pleural effusion or pneumothorax.  Musculoskeletal: Prior median sternotomy. No aggressive appearing osseous lesions.  Review of the MIP images confirms the above findings.  CTA ABDOMEN AND PELVIS FINDINGS  VASCULAR  Aorta: Normal caliber aorta without aneurysm, dissection, or vasculitis. Moderate predominantly calcified plaque of the thoracic aorta. Arch vessels are patent. Moderate to severe narrowing at the origin of the left subclavian artery due to calcified plaque. Moderate to severe atherosclerotic disease of the abdominal aorta.  Celiac: Patent without evidence of aneurysm, dissection, or vasculitis. Moderate atherosclerotic disease without significant stenosis.  SMA: Patent without evidence of aneurysm, dissection, or vasculitis. Moderate narrowing at the origin due to calcified plaque.  Renals: Both renal arteries are patent without evidence of aneurysm, dissection, vasculitis, or fibromuscular dysplasia. Mild narrowing  of the origin of the left renal artery due to calcified plaque.  IMA: Patent without evidence of aneurysm, dissection, or vasculitis. Narrowing at the origin due to calcified plaque.  Inflow: Patent without evidence of aneurysm, dissection, or vasculitis. Multifocal moderate to severe narrowing of the bilateral common iliac arteries due to calcified and noncalcified plaque. Multifocal moderate narrowing of the bilateral external iliac arteries due to calcified and noncalcified plaque. Multifocal moderate severe narrowing of the  bilateral internal iliac arteries due to predominantly calcified plaque.  Veins: No obvious venous abnormality within the limitations of this arterial phase study.  Review of the MIP images confirms the above findings.  NON-VASCULAR  Hepatobiliary: No focal liver abnormality is seen. No gallstones, gallbladder wall thickening, or biliary dilatation.  Pancreas: Unremarkable. No pancreatic ductal dilatation or surrounding inflammatory changes.  Spleen: Heterogeneity of the spleen, likely perfusional. Normal in size without focal abnormality.  Adrenals/Urinary Tract: Adrenal glands are unremarkable. Kidneys are normal, without renal calculi, focal lesion, or hydronephrosis. Bladder is unremarkable.  Stomach/Bowel: Stomach is within normal limits. Duodenal diverticulum. Appendix is not visualized. Diverticulosis. No evidence of bowel wall thickening, distention, or inflammatory changes.  Lymphatic: No pathologically enlarged lymph nodes seen in the abdomen or pelvis.  Reproductive: Prostate is unremarkable.  Other: Small bilateral fat containing inguinal hernias.  Musculoskeletal: No acute or significant osseous findings.  Review of the MIP images confirms the above findings.  IMPRESSION: 1. Normal caliber aorta.  No evidence of acute aortic syndrome. 2. Moderate to severeAortic Atherosclerosis (ICD10-I70.0). Multifocal moderate to severe narrowing of iliac arteries due to calcified and noncalcified plaque. 3. Diverticulosis with no evidence of diverticulitis.  Electronically Signed   By: Yetta Glassman M.D.   On: 07/16/2021 14:26 DG Chest 2 View CLINICAL DATA:  Chest pain  EXAM: CHEST - 2 VIEW  COMPARISON:  Chest x-ray dated June 16, 2017  FINDINGS: Cardiac and mediastinal contours are unchanged. Prior CABG. No focal opacity. No pleural effusion or evidence of pneumothorax.  IMPRESSION: No active cardiopulmonary disease.  Electronically Signed   By:  Yetta Glassman M.D.   On: 07/16/2021 13:43 Note: Reviewed        Physical Exam  General appearance: Well nourished, well developed, and well hydrated. In no apparent acute distress Mental status: Alert, oriented x 3 (person, place, & time)       Respiratory: No evidence of acute respiratory distress Eyes: PERLA Vitals: BP (!) 142/79    Pulse 77    Temp (!) 97 F (36.1 C)    Resp 16    Ht _0  (1.6 m)    Wt 165 lb (74.8 kg)    SpO2 99%    BMI 29.23 kg/m  BMI: Estimated body mass index is 29.23 kg/m as calculated from the following:   Height as of this encounter: _1  (1.6 m).   Weight as of this encounter: 165 lb (74.8 kg). Ideal: Ideal body weight: 56.9 kg (125 lb 7.1 oz) Adjusted ideal body weight: 64.1 kg (141 lb 4.2 oz)  Assessment   Status Diagnosis  Controlled Controlled Controlled 1. Chronic pain syndrome   2. Chronic chest wall pain (1ry area of Pain) (Incisional Midline) (since 04/22/2012)   3. Chronic lower extremity pain (2ry area of Pain) (Bilateral) (R>L)   4. Chronic shoulder pain (3ry area of Pain) (Right)   5. Chronic low back pain (Bilateral) (R>L) w/o sciatica   6. Lumbar facet syndrome (Bilateral) (R>L)   7. Pharmacologic therapy  8. Chronic use of opiate for therapeutic purpose   9. Encounter for medication management      Updated Problems: No problems updated.  Plan of Care  Problem-specific:  No problem-specific Assessment & Plan notes found for this encounter.  Mr. Kyng Matlock. has a current medication list which includes the following long-term medication(s): allopurinol, atorvastatin, [START ON 08/28/2021] hydrocodone-acetaminophen, [START ON 09/27/2021] hydrocodone-acetaminophen, [START ON 10/27/2021] hydrocodone-acetaminophen, insulin detemir, isosorbide mononitrate, metoprolol tartrate, pantoprazole, and gabapentin.  Pharmacotherapy (Medications Ordered): Meds ordered this encounter  Medications   HYDROcodone-acetaminophen  (NORCO/VICODIN) 5-325 MG tablet    Sig: Take 1 tablet by mouth 2 (two) times daily as needed for severe pain. Must last 30 days    Dispense:  60 tablet    Refill:  0    DO NOT: delete (not duplicate); no partial-fill (will deny script to complete), no refill request (F/U required). DISPENSE: 1 day early if closed on fill date. WARN: No CNS-depressants within 8 hrs of med.   HYDROcodone-acetaminophen (NORCO/VICODIN) 5-325 MG tablet    Sig: Take 1 tablet by mouth 2 (two) times daily as needed for severe pain. Must last 30 days    Dispense:  60 tablet    Refill:  0    DO NOT: delete (not duplicate); no partial-fill (will deny script to complete), no refill request (F/U required). DISPENSE: 1 day early if closed on fill date. WARN: No CNS-depressants within 8 hrs of med.   HYDROcodone-acetaminophen (NORCO/VICODIN) 5-325 MG tablet    Sig: Take 1 tablet by mouth 2 (two) times daily as needed for severe pain. Must last 30 days    Dispense:  60 tablet    Refill:  0    DO NOT: delete (not duplicate); no partial-fill (will deny script to complete), no refill request (F/U required). DISPENSE: 1 day early if closed on fill date. WARN: No CNS-depressants within 8 hrs of med.   Orders:  No orders of the defined types were placed in this encounter.  Follow-up plan:   Return in about 3 months (around 11/26/2021) for Eval-day (M,W), (F2F), (MM).     Interventional management options: Planned, scheduled, and/or pending:   None at this time.   Considering:   Therapeutic bilateral Lumbar facet + bilateral sacroiliac joint RFA #1    Palliative PRN treatment(s):   Palliative left intra-articular hip joint injection #2  Palliative bilateral lumbar facet block #3  Palliative bilateral sacroiliac joint block #2      Recent Visits No visits were found meeting these conditions. Showing recent visits within past 90 days and meeting all other requirements Today's Visits Date Type Provider Dept  08/21/21  Office Visit Milinda Pointer, MD [facility]  Showing today's visits and meeting all other requirements Future Appointments No visits were found meeting these conditions. Showing future appointments within next 90 days and meeting all other requirements  I discussed the assessment and treatment plan with the patient. The patient was provided an opportunity to ask questions and all were answered. The patient agreed with the plan and demonstrated an understanding of the instructions.  Patient advised to call back or seek an in-person evaluation if the symptoms or condition worsens.  Duration of encounter: 30 minutes.  Note by: Gaspar Cola, MD Date: 08/21/2021; Time: 11:55 AM

## 2021-08-21 ENCOUNTER — Encounter: Payer: Self-pay | Admitting: Pain Medicine

## 2021-08-21 ENCOUNTER — Other Ambulatory Visit: Payer: Self-pay

## 2021-08-21 ENCOUNTER — Ambulatory Visit: Payer: Medicare Other | Attending: Pain Medicine | Admitting: Pain Medicine

## 2021-08-21 ENCOUNTER — Encounter: Payer: Medicare Other | Admitting: Pain Medicine

## 2021-08-21 VITALS — BP 142/79 | HR 77 | Temp 97.0°F | Resp 16 | Ht 63.0 in | Wt 165.0 lb

## 2021-08-21 DIAGNOSIS — M47816 Spondylosis without myelopathy or radiculopathy, lumbar region: Secondary | ICD-10-CM

## 2021-08-21 DIAGNOSIS — G894 Chronic pain syndrome: Secondary | ICD-10-CM

## 2021-08-21 DIAGNOSIS — M25511 Pain in right shoulder: Secondary | ICD-10-CM

## 2021-08-21 DIAGNOSIS — R0789 Other chest pain: Secondary | ICD-10-CM

## 2021-08-21 DIAGNOSIS — M79604 Pain in right leg: Secondary | ICD-10-CM | POA: Diagnosis present

## 2021-08-21 DIAGNOSIS — G8929 Other chronic pain: Secondary | ICD-10-CM | POA: Diagnosis present

## 2021-08-21 DIAGNOSIS — M79605 Pain in left leg: Secondary | ICD-10-CM

## 2021-08-21 DIAGNOSIS — M545 Low back pain, unspecified: Secondary | ICD-10-CM

## 2021-08-21 DIAGNOSIS — Z79891 Long term (current) use of opiate analgesic: Secondary | ICD-10-CM | POA: Diagnosis present

## 2021-08-21 DIAGNOSIS — Z79899 Other long term (current) drug therapy: Secondary | ICD-10-CM

## 2021-08-21 MED ORDER — HYDROCODONE-ACETAMINOPHEN 5-325 MG PO TABS: 1.0000 | ORAL_TABLET | Freq: Two times a day (BID) | ORAL | 0 refills | Status: DC | PRN

## 2021-08-21 NOTE — Progress Notes (Signed)
Nursing Pain Medication Assessment:  Safety precautions to be maintained throughout the outpatient stay will include: orient to surroundings, keep bed in low position, maintain call bell within reach at all times, provide assistance with transfer out of bed and ambulation.  Medication Inspection Compliance: Pill count conducted under aseptic conditions, in front of the patient. Neither the pills nor the bottle was removed from the patient's sight at any time. Once count was completed pills were immediately returned to the patient in their original bottle.  Medication: See above Pill/Patch Count:  15 of 60 pills remain Pill/Patch Appearance: Markings consistent with prescribed medication Bottle Appearance: Standard pharmacy container. Clearly labeled. Filled Date: 44 / 26 / 2022 Last Medication intake:  TodaySafety precautions to be maintained throughout the outpatient stay will include: orient to surroundings, keep bed in low position, maintain call bell within reach at all times, provide assistance with transfer out of bed and ambulation.

## 2021-08-21 NOTE — Patient Instructions (Signed)
____________________________________________________________________________________________ ° °Medication Rules ° °Purpose: To inform patients, and their family members, of our rules and regulations. ° °Applies to: All patients receiving prescriptions (written or electronic). ° °Pharmacy of record: Pharmacy where electronic prescriptions will be sent. If written prescriptions are taken to a different pharmacy, please inform the nursing staff. The pharmacy listed in the electronic medical record should be the one where you would like electronic prescriptions to be sent. ° °Electronic prescriptions: In compliance with the [place] [employer]reng[employer] Opioid Misuse Prevention (STOP) Act of 2017 (Session Law 2017-74/H243), effective September 03, 2018, all controlled substances must be electronically prescribed. Calling prescriptions to the pharmacy will cease to exist. ° °Prescription refills: Only during scheduled appointments. Applies to all prescriptions. ° °NOTE: The following applies primarily to controlled substances (Opioid* Pain Medications).  ° °Type of encounter (visit): For patients receiving controlled substances, face-to-face visits are required. (Not an option or up to the patient.) ° °Patient's responsibilities: °Pain Pills: Bring all pain pills to every appointment (except for procedure appointments). °Pill Bottles: Bring pills in original pharmacy bottle. Always bring the newest bottle. Bring bottle, even if empty. °Medication refills: You are responsible for knowing and keeping track of what medications you take and those you need refilled. °The day before your appointment: write a list of all prescriptions that need to be refilled. °The day of the appointment: give the list to the admitting nurse. Prescriptions will be written only during appointments. No prescriptions will be written on procedure days. °If you forget a medication: it will not be "Called in", "Faxed", or "electronically sent". You will  need to get another appointment to get these prescribed. °No early refills. Do not call asking to have your prescription filled early. °Prescription Accuracy: You are responsible for carefully inspecting your prescriptions before leaving our office. Have the discharge nurse carefully go over each prescription with you, before taking them home. Make sure that your name is accurately spelled, that your address is correct. Check the name and dose of your medication to make sure it is accurate. Check the number of pills, and the written instructions to make sure they are clear and accurate. Make sure that you are given enough medication to last until your next medication refill appointment. °Taking Medication: Take medication as prescribed. When it comes to controlled substances, taking less pills or less frequently than prescribed is permitted and encouraged. °Never take more pills than instructed. °Never take medication more frequently than prescribed.  °Inform other Doctors: Always inform, all of your healthcare providers, of all the medications you take. °Pain Medication from other Providers: You are not allowed to accept any additional pain medication from any other Doctor or Healthcare provider. There are two exceptions to this rule. (see below) In the event that you require additional pain medication, you are responsible for notifying us, as stated below. °Cough Medicine: Often these contain an opioid, such as codeine or hydrocodone. Never accept or take cough medicine containing these opioids if you are already taking an opioid* medication. The combination may cause respiratory failure and death. °Medication Agreement: You are responsible for carefully reading and following our Medication Agreement. This must be signed before receiving any prescriptions from our practice. Safely store a copy of your signed Agreement. Violations to the Agreement will result in no further prescriptions. (Additional copies of our  Medication Agreement are available upon request.) °Laws, Rules, & Regulations: All patients are expected to follow all Federal and State Laws, Statutes, Rules, & Regulations. Ignorance of   the Laws does not constitute a valid excuse.  °Illegal drugs and Controlled Substances: The use of illegal substances (including, but not limited to marijuana and its derivatives) and/or the illegal use of any controlled substances is strictly prohibited. Violation of this rule may result in the immediate and permanent discontinuation of any and all prescriptions being written by our practice. The use of any illegal substances is prohibited. °Adopted CDC guidelines & recommendations: Target dosing levels will be at or below 60 MME/day. Use of benzodiazepines** is not recommended. ° °Exceptions: There are only two exceptions to the rule of not receiving pain medications from other Healthcare Providers. °Exception #1 (Emergencies): In the event of an emergency (i.e.: accident requiring emergency care), you are allowed to receive additional pain medication. However, you are responsible for: As soon as you are able, call our office [phone], at any time of the day or night, and leave a message stating your name, the date and nature of the emergency, and the name and dose of the medication prescribed. In the event that your call is answered by a member of our staff, make sure to document and save the date, time, and the name of the person that took your information.  °Exception #2 (Planned Surgery): In the event that you are scheduled by another doctor or dentist to have any type of surgery or procedure, you are allowed (for a period no longer than 30 days), to receive additional pain medication, for the acute post-op pain. However, in this case, you are responsible for picking up a copy of our "Post-op Pain Management for Surgeons" handout, and giving it to your surgeon or dentist. This document is available at our office, and  does not require an appointment to obtain it. Simply go to our office during business hours (Monday-Thursday from 8:00 AM to 4:00 PM) (Friday 8:00 AM to 12:00 Noon) or if you have a scheduled appointment with us, prior to your surgery, and ask for it by name. In addition, you are responsible for: calling our office [phone], at any time of the day or night, and leaving a message stating your name, name of your surgeon, type of surgery, and date of procedure or surgery. Failure to comply with your responsibilities may result in termination of therapy involving the controlled substances. °Medication Agreement Violation. Following the above rules, including your responsibilities will help you in avoiding a Medication Agreement Violation (“Breaking your Pain Medication Contract”). ° °*Opioid medications include: morphine, codeine, oxycodone, oxymorphone, hydrocodone, hydromorphone, meperidine, tramadol, tapentadol, buprenorphine, fentanyl, methadone. °**Benzodiazepine medications include: diazepam (Valium), alprazolam (Xanax), clonazepam (Klonopine), lorazepam (Ativan), clorazepate (Tranxene), chlordiazepoxide (Librium), estazolam (Prosom), oxazepam (Serax), temazepam (Restoril), triazolam (Halcion) °(Last updated: 05/31/2021) °____________________________________________________________________________________________ ° ____________________________________________________________________________________________ ° °Medication Recommendations and Reminders ° °Applies to: All patients receiving prescriptions (written and/or electronic). ° °Medication Rules & Regulations: These rules and regulations exist for your safety and that of others. They are not flexible and neither are we. Dismissing or ignoring them will be considered "non-compliance" with medication therapy, resulting in complete and irreversible termination of such therapy. (See document titled "Medication Rules" for more details.) In all conscience,  because of safety reasons, we cannot continue providing a therapy where the patient does not follow instructions. ° °Pharmacy of record:  °Definition: This is the pharmacy where your electronic prescriptions will be sent.  °We do not endorse any particular pharmacy, however, we have experienced problems with [facility] not securing enough medication supply for the community. °We do not restrict you   in your choice of pharmacy. However, once we write for your prescriptions, we will NOT be re-sending more prescriptions to fix restricted supply problems created by your pharmacy, or your insurance.  °The pharmacy listed in the electronic medical record should be the one where you want electronic prescriptions to be sent. °If you choose to change pharmacy, simply notify our nursing staff. ° °Recommendations: °Keep all of your pain medications in a safe place, under lock and key, even if you live alone. We will NOT replace lost, stolen, or damaged medication. °After you fill your prescription, take 1 week's worth of pills and put them away in a safe place. You should keep a separate, properly labeled bottle for this purpose. The remainder should be kept in the original bottle. Use this as your primary supply, until it runs out. Once it's gone, then you know that you have 1 week's worth of medicine, and it is time to come in for a prescription refill. If you do this correctly, it is unlikely that you will ever run out of medicine. °To make sure that the above recommendation works, it is very important that you make sure your medication refill appointments are scheduled at least 1 week before you run out of medicine. To do this in an effective manner, make sure that you do not leave the office without scheduling your next medication management appointment. Always ask the nursing staff to show you in your prescription , when your medication will be running out. Then arrange for the receptionist to get you a return appointment,  at least 7 days before you run out of medicine. Do not wait until you have 1 or 2 pills left, to come in. This is very poor planning and does not take into consideration that we may need to cancel appointments due to bad weather, sickness, or emergencies affecting our staff. °DO NOT ACCEPT A "Partial Fill": If for any reason your pharmacy does not have enough pills/tablets to completely fill or refill your prescription, do not allow for a "partial fill". The law allows the pharmacy to complete that prescription within 72 hours, without requiring a new prescription. If they do not fill the rest of your prescription within those 72 hours, you will need a separate prescription to fill the remaining amount, which we will NOT provide. If the reason for the partial fill is your insurance, you will need to talk to the pharmacist about payment alternatives for the remaining tablets, but again, DO NOT ACCEPT A PARTIAL FILL, unless you can trust your pharmacist to obtain the remainder of the pills within 72 hours. ° °Prescription refills and/or changes in medication(s):  °Prescription refills, and/or changes in dose or medication, will be conducted only during scheduled medication management appointments. (Applies to both, written and electronic prescriptions.) °No refills on procedure days. No medication will be changed or started on procedure days. No changes, adjustments, and/or refills will be conducted on a procedure day. Doing so will interfere with the diagnostic portion of the procedure. °No phone refills. No medications will be "called into the pharmacy". °No Fax refills. °No weekend refills. °No Holliday refills. °No after hours refills. ° °Remember:  °Business hours are:  °Monday to Thursday 8:00 AM to 4:00 PM °Provider's Schedule: °Sammy Cassar, MD - Appointments are:  °Medication management: Monday and Wednesday 8:00 AM to 4:00 PM °Procedure day: Tuesday and Thursday 7:30 AM to 4:00 PM °Bilal Lateef, MD -  Appointments are:  °Medication management: Tuesday and Thursday 8:00   AM to 4:00 PM Procedure day: Monday and Wednesday 7:30 AM to 4:00 PM (Last update: 03/23/2020) ____________________________________________________________________________________________

## 2021-08-23 ENCOUNTER — Encounter: Payer: Medicare Other | Admitting: Pain Medicine

## 2021-09-27 DIAGNOSIS — I70219 Atherosclerosis of native arteries of extremities with intermittent claudication, unspecified extremity: Secondary | ICD-10-CM | POA: Insufficient documentation

## 2021-11-07 NOTE — Progress Notes (Signed)
PROVIDER NOTE: Information contained herein reflects review and annotations entered in association with encounter. Interpretation of such information and data should be left to medically-trained personnel. Information provided to patient can be located elsewhere in the medical record under "Patient Instructions". Document created using STT-dictation technology, any transcriptional errors that may result from process are unintentional.    Patient: [name] [name].  Service Category: E/M  Provider: Gaspar Cola, MD  DOB: 20-Oct-1955  DOS: 11/08/2021  Specialty: Interventional Pain Management  MRN: [id number]  Setting: Ambulatory outpatient  PCP: Ranae Plumber, PA  Type: Established Patient    Referring Provider: Ranae Plumber, PA  Location: Office  Delivery: Face-to-face     HPI  [name] [name]., a 66 y.o. year old male, is here today because of his Chronic pain syndrome [G89.4]. [name] [name] primary complain today is Chest Pain (mid) Last encounter: My last encounter with him was on 08/21/2021. Pertinent problems: [name] [name] has Chronic shoulder pain (3ry area of Pain) (Right); Chronic hip pain (Left); Chronic chest wall pain (1ry area of Pain) (Incisional Midline) (since 04/22/2012); Chronic lower extremity pain (2ry area of Pain) (Bilateral) (R>L); Chronic sacroiliac joint pain (Bilateral) (L>R); Neuropathic pain; Neurogenic pain; Incisional pain; Lumbar facet syndrome (Bilateral) (R>L); Lumbar spondylosis; Keloid skin disorder; Costochondritis; Brachial plexus injury, right; Right sided weakness; Chronic pain syndrome; DDD (degenerative disc disease), lumbosacral; Lumbar facet arthropathy (Multilevel) (Bilateral); 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 Pain Assessment: Severity of Chronic pain is reported as a 3 /10. Location: Chest Mid/Denies. Onset: More than a month ago. Quality: Aching, Sharp. Timing: Constant. Modifying factor(s): Meds. Vitals:  height is '5\' 3"'  (1.6 m) and weight is 160 lb (72.6 kg). His temperature is 97 F (36.1 C) (abnormal). His blood pressure is 104/67 and his pulse is 80. His oxygen saturation is 96%.   Reason for encounter: medication management.   The patient indicates doing well with the current medication regimen. No adverse reactions or side effects reported to the medications.   RTCB: 02/24/2022 Nonopioids transfer 07/25/2020: Gabapentin  Pharmacotherapy Assessment  Analgesic: Hydrocodone/APAP 5/325 mg, 1 tab PO BID (10 mg/day of hydrocodone) MME/day: 10 mg/day.   Monitoring: [place] PMP: PDMP reviewed during this encounter.       Pharmacotherapy: No side-effects or adverse reactions reported. Compliance: No problems identified. Effectiveness: Clinically acceptable.  Chauncey Fischer, RN  11/08/2021  2:16 PM  Sign when Signing Visit Nursing Pain Medication Assessment:  Safety precautions to be maintained throughout the outpatient stay will include: orient to surroundings, keep bed in low position, maintain call bell within reach at all times, provide assistance with transfer out of bed and ambulation.  Medication Inspection Compliance: Pill count conducted under aseptic conditions, in front of the patient. Neither the pills nor the bottle was removed from the patient's sight at any time. Once count was completed pills were immediately returned to the patient in their original bottle.  Medication: Hydrocodone/APAP Pill/Patch Count:  36 of 60 pills remain Pill/Patch Appearance: Markings consistent with prescribed medication Bottle Appearance: Standard pharmacy container. Clearly labeled. Filled Date: 2 / 24 / 2022 Last Medication intake:  TodaySafety precautions to be maintained throughout the  outpatient stay will include: orient to surroundings, keep bed in low position, maintain call bell within reach at all  times, provide assistance with transfer out of bed and ambulation.     UDS:  Summary  Date Value Ref Range Status  05/22/2021 Note  Final    Comment:    ==================================================================== [facility] (MW) ==================================================================== Test                             Result       Flag       Units  Drug Present and Declared for Prescription Verification   Hydrocodone                    1462         EXPECTED   ng/mg creat   Hydromorphone                  360          EXPECTED   ng/mg creat   Dihydrocodeine                 61           EXPECTED   ng/mg creat   Norhydrocodone                 671          EXPECTED   ng/mg creat    Sources of hydrocodone include scheduled prescription medications.    Hydromorphone, dihydrocodeine and norhydrocodone are expected    metabolites of hydrocodone. Hydromorphone and dihydrocodeine are    also available as scheduled prescription medications.  ==================================================================== Test                      Result    Flag   Units      Ref Range   Creatinine              185              mg/dL      >=20 ==================================================================== Declared Medications:  The flagging and interpretation on this report are based on the  following declared medications.  Unexpected results may arise from  inaccuracies in the declared medications.   **Note: The testing scope of this panel includes these medications:   Hydrocodone (Norco)   **Note: The testing scope of this panel does not include the  following reported medications:   Acetaminophen (Norco)  Allopurinol (Zyloprim)  Aspirin  Atorvastatin (Lipitor)  Cholecalciferol  Gabapentin (Neurontin)  Hydrochlorothiazide (Hydrodiuril)  Insulin  (Levemir)  Isosorbide (Imdur)  Linagliptin (Tradjenta)  Losartan (Cozaar)  Metoprolol (Lopressor)  Multivitamin  Nitroglycerin (Nitrostat)  Pantoprazole (Protonix)  Potassium  Semaglutide (Ozempic) ==================================================================== For clinical consultation, please call [phone]. ====================================================================      ROS  Constitutional: Denies any fever or chills Gastrointestinal: No reported hemesis, hematochezia, vomiting, or acute GI distress Musculoskeletal: Denies any acute onset joint swelling, redness, loss of ROM, or weakness Neurological: No reported episodes of acute onset apraxia, aphasia, dysarthria, agnosia, amnesia, paralysis, loss of coordination, or loss of consciousness  Medication Review  Cholecalciferol, HYDROcodone-acetaminophen, Insulin Pen Needle, Potassium, Semaglutide(0.25 or 0.5MG/DOS), allopurinol, aspirin EC, atorvastatin, gabapentin, hydrochlorothiazide, insulin detemir, isosorbide mononitrate, losartan, metoprolol tartrate, multivitamin with minerals, nitroGLYCERIN, and pantoprazole  History Review  Allergy: [name] [name] has No Known Allergies. Drug: [name] [name]  reports no history of drug use. Alcohol:  reports current alcohol use. Tobacco:  reports that he has quit smoking. His smoking use included cigarettes. He has quit  using smokeless tobacco. Social: [name] [name]  reports that he has quit smoking. His smoking use included cigarettes. He has quit using smokeless tobacco. He reports current alcohol use. He reports that he does not use drugs. Medical:  has a past medical history of Benign esophageal stricture, Cardiomyopathy, secondary ([place]), Coronary artery disease, Diabetes mellitus without complication ([place]), Diverticulitis, GI bleed, Gout, Hemorrhoids, Hyperlipidemia, Hypertension, Sleep apnea, and Tubular adenoma of colon. Surgical: [name] [name]  has a past surgical history  that includes Coronary angioplasty with stent; cardiac bypass; Cardiac surgery; Colonoscopy with propofol (N/A, 04/29/2015); Esophagogastroduodenoscopy (egd) with propofol (N/A, 04/29/2015); Septoplasty; Cardiac catheterization (Left, 02/03/2016); Coronary artery bypass graft; and Colonoscopy with propofol (N/A, 07/20/2020). Family: family history includes Heart disease in his mother; Hypertension in his father.  Laboratory Chemistry Profile   Renal Lab Results  Component Value Date   BUN 20 07/16/2021   CREATININE 1.27 (H) 07/16/2021   GFRAA >60 05/07/2019   GFRNONAA >60 07/16/2021    Hepatic Lab Results  Component Value Date   AST 76 (H) 07/16/2021   ALT 71 (H) 07/16/2021   ALBUMIN 4.4 07/16/2021   ALKPHOS 75 07/16/2021   HCVAB NON REACTIVE 10/09/2020   LIPASE 39 07/16/2021    Electrolytes Lab Results  Component Value Date   NA 138 07/16/2021   K 3.6 07/16/2021   CL 101 07/16/2021   CALCIUM 9.6 07/16/2021   MG 2.2 10/09/2020   PHOS 3.7 10/09/2020    Bone Lab Results  Component Value Date   VD25OH 74.7 05/07/2019   VD125OH2TOT 30.8 10/26/2015    Inflammation (CRP: Acute Phase) (ESR: Chronic Phase) Lab Results  Component Value Date   CRP 0.9 05/07/2019   ESRSEDRATE 41 (H) 05/07/2019   LATICACIDVEN 1.9 10/09/2020         Note: Above Lab results reviewed.  Recent Imaging Review  CT Angio Chest/Abd/Pel for Dissection W and/or W/WO CLINICAL DATA:  Chest pain  EXAM: CT ANGIOGRAPHY CHEST, ABDOMEN AND PELVIS  TECHNIQUE: Non-contrast CT of the chest was initially obtained.  Multidetector CT imaging through the chest, abdomen and pelvis was performed using the standard protocol during bolus administration of intravenous contrast. Multiplanar reconstructed images and MIPs were obtained and reviewed to evaluate the vascular anatomy.  CONTRAST:  161m OMNIPAQUE IOHEXOL 350 MG/ML SOLN  COMPARISON:  CT abdomen and pelvis dated December 04, 2016  FINDINGS: CTA CHEST  FINDINGS  Cardiovascular: Normal heart size. No pericardial effusion. Coronary artery calcifications status post CABG. No suspicious central filling defects of the pulmonary arteries.  Mediastinum/Nodes: Esophagus and thyroid are unremarkable. No pathologically enlarged lymph nodes in the chest.  Lungs/Pleura: Atelectasis of the left lower lobe. No consolidation, pleural effusion or pneumothorax.  Musculoskeletal: Prior median sternotomy. No aggressive appearing osseous lesions.  Review of the MIP images confirms the above findings.  CTA ABDOMEN AND PELVIS FINDINGS  VASCULAR  Aorta: Normal caliber aorta without aneurysm, dissection, or vasculitis. Moderate predominantly calcified plaque of the thoracic aorta. Arch vessels are patent. Moderate to severe narrowing at the origin of the left subclavian artery due to calcified plaque. Moderate to severe atherosclerotic disease of the abdominal aorta.  Celiac: Patent without evidence of aneurysm, dissection, or vasculitis. Moderate atherosclerotic disease without significant stenosis.  SMA: Patent without evidence of aneurysm, dissection, or vasculitis. Moderate narrowing at the origin due to calcified plaque.  Renals: Both renal arteries are patent without evidence of aneurysm, dissection, vasculitis, or fibromuscular dysplasia. Mild narrowing of the origin of the left renal artery  due to calcified plaque.  IMA: Patent without evidence of aneurysm, dissection, or vasculitis. Narrowing at the origin due to calcified plaque.  Inflow: Patent without evidence of aneurysm, dissection, or vasculitis. Multifocal moderate to severe narrowing of the bilateral common iliac arteries due to calcified and noncalcified plaque. Multifocal moderate narrowing of the bilateral external iliac arteries due to calcified and noncalcified plaque. Multifocal moderate severe narrowing of the bilateral internal iliac arteries due to predominantly  calcified plaque.  Veins: No obvious venous abnormality within the limitations of this arterial phase study.  Review of the MIP images confirms the above findings.  NON-VASCULAR  Hepatobiliary: No focal liver abnormality is seen. No gallstones, gallbladder wall thickening, or biliary dilatation.  Pancreas: Unremarkable. No pancreatic ductal dilatation or surrounding inflammatory changes.  Spleen: Heterogeneity of the spleen, likely perfusional. Normal in size without focal abnormality.  Adrenals/Urinary Tract: Adrenal glands are unremarkable. Kidneys are normal, without renal calculi, focal lesion, or hydronephrosis. Bladder is unremarkable.  Stomach/Bowel: Stomach is within normal limits. Duodenal diverticulum. Appendix is not visualized. Diverticulosis. No evidence of bowel wall thickening, distention, or inflammatory changes.  Lymphatic: No pathologically enlarged lymph nodes seen in the abdomen or pelvis.  Reproductive: Prostate is unremarkable.  Other: Small bilateral fat containing inguinal hernias.  Musculoskeletal: No acute or significant osseous findings.  Review of the MIP images confirms the above findings.  IMPRESSION: 1. Normal caliber aorta.  No evidence of acute aortic syndrome. 2. Moderate to severeAortic Atherosclerosis (ICD10-I70.0). Multifocal moderate to severe narrowing of iliac arteries due to calcified and noncalcified plaque. 3. Diverticulosis with no evidence of diverticulitis.  Electronically Signed   By: Yetta Glassman M.D.   On: 07/16/2021 14:26 DG Chest 2 View CLINICAL DATA:  Chest pain  EXAM: CHEST - 2 VIEW  COMPARISON:  Chest x-ray dated June 16, 2017  FINDINGS: Cardiac and mediastinal contours are unchanged. Prior CABG. No focal opacity. No pleural effusion or evidence of pneumothorax.  IMPRESSION: No active cardiopulmonary disease.  Electronically Signed   By: Yetta Glassman M.D.   On: 07/16/2021 13:43 Note:  Reviewed        Physical Exam  General appearance: Well nourished, well developed, and well hydrated. In no apparent acute distress Mental status: Alert, oriented x 3 (person, place, & time)       Respiratory: No evidence of acute respiratory distress Eyes: PERLA Vitals: BP 104/67    Pulse 80    Temp (!) 97 F (36.1 C)    Ht '5\' 3"'  (1.6 m)    Wt 160 lb (72.6 kg)    SpO2 96%    BMI 28.34 kg/m  BMI: Estimated body mass index is 28.34 kg/m as calculated from the following:   Height as of this encounter: '5\' 3"'  (1.6 m).   Weight as of this encounter: 160 lb (72.6 kg). Ideal: Ideal body weight: 56.9 kg (125 lb 7.1 oz) Adjusted ideal body weight: 63.2 kg (139 lb 4.2 oz)  Assessment   Status Diagnosis  Controlled Controlled Controlled 1. Chronic pain syndrome   2. Chronic chest wall pain (1ry area of Pain) (Incisional Midline) (since 04/22/2012)   3. Chronic lower extremity pain (2ry area of Pain) (Bilateral) (R>L)   4. Chronic shoulder pain (3ry area of Pain) (Right)   5. Chronic low back pain (Bilateral) (R>L) w/o sciatica   6. Lumbar facet syndrome (Bilateral) (R>L)   7. Pharmacologic therapy   8. Chronic use of opiate for therapeutic purpose   9. Encounter for medication  management   10. Encounter for chronic pain management      Updated Problems: No problems updated.  Plan of Care  Problem-specific:  No problem-specific Assessment & Plan notes found for this encounter.  [name] has a current medication list which includes the following long-term medication(s): allopurinol, atorvastatin, [START ON 11/26/2021] hydrocodone-acetaminophen, [START ON 12/26/2021] hydrocodone-acetaminophen, [START ON 01/25/2022] hydrocodone-acetaminophen, insulin detemir, isosorbide mononitrate, metoprolol tartrate, gabapentin, and pantoprazole.  Pharmacotherapy (Medications Ordered): Meds ordered this encounter  Medications   HYDROcodone-acetaminophen (NORCO/VICODIN) 5-325 MG tablet     Sig: Take 1 tablet by mouth 2 (two) times daily as needed for severe pain. Must last 30 days    Dispense:  60 tablet    Refill:  0    DO NOT: delete (not duplicate); no partial-fill (will deny script to complete), no refill request (F/U required). DISPENSE: 1 day early if closed on fill date. WARN: No CNS-depressants within 8 hrs of med.   HYDROcodone-acetaminophen (NORCO/VICODIN) 5-325 MG tablet    Sig: Take 1 tablet by mouth 2 (two) times daily as needed for severe pain. Must last 30 days    Dispense:  60 tablet    Refill:  0    DO NOT: delete (not duplicate); no partial-fill (will deny script to complete), no refill request (F/U required). DISPENSE: 1 day early if closed on fill date. WARN: No CNS-depressants within 8 hrs of med.   HYDROcodone-acetaminophen (NORCO/VICODIN) 5-325 MG tablet    Sig: Take 1 tablet by mouth 2 (two) times daily as needed for severe pain. Must last 30 days    Dispense:  60 tablet    Refill:  0    DO NOT: delete (not duplicate); no partial-fill (will deny script to complete), no refill request (F/U required). DISPENSE: 1 day early if closed on fill date. WARN: No CNS-depressants within 8 hrs of med.   Orders:  No orders of the defined types were placed in this encounter.  Follow-up plan:   Return in about 4 months (around 02/24/2022) for Eval-day (M,W), (F2F), (MM).     Interventional management options: Planned, scheduled, and/or pending:   None at this time.   Considering:   Therapeutic bilateral Lumbar facet + bilateral sacroiliac joint RFA #1    Palliative PRN treatment(s):   Palliative left intra-articular hip joint injection #2  Palliative bilateral lumbar facet block #3  Palliative bilateral sacroiliac joint block #2     Recent Visits Date Type Provider Dept  08/21/21 Office Visit Milinda Pointer, MD [facility]  Showing recent visits within past 90 days and meeting all other requirements Today's Visits Date Type Provider Dept   11/08/21 Office Visit Milinda Pointer, MD [facility]  Showing today's visits and meeting all other requirements Future Appointments No visits were found meeting these conditions. Showing future appointments within next 90 days and meeting all other requirements  I discussed the assessment and treatment plan with the patient. The patient was provided an opportunity to ask questions and all were answered. The patient agreed with the plan and demonstrated an understanding of the instructions.  Patient advised to call back or seek an in-person evaluation if the symptoms or condition worsens.  Duration of encounter: 30 minutes.  Note by: Gaspar Cola, MD Date: 11/08/2021; Time: 2:16 PM

## 2021-11-08 ENCOUNTER — Other Ambulatory Visit: Payer: Self-pay

## 2021-11-08 ENCOUNTER — Ambulatory Visit: Payer: Medicare Other | Attending: Pain Medicine | Admitting: Pain Medicine

## 2021-11-08 ENCOUNTER — Encounter: Payer: Self-pay | Admitting: Pain Medicine

## 2021-11-08 VITALS — BP 104/67 | HR 80 | Temp 97.0°F | Ht 63.0 in | Wt 160.0 lb

## 2021-11-08 DIAGNOSIS — Z79891 Long term (current) use of opiate analgesic: Secondary | ICD-10-CM | POA: Diagnosis present

## 2021-11-08 DIAGNOSIS — M25511 Pain in right shoulder: Secondary | ICD-10-CM | POA: Diagnosis present

## 2021-11-08 DIAGNOSIS — M545 Low back pain, unspecified: Secondary | ICD-10-CM | POA: Insufficient documentation

## 2021-11-08 DIAGNOSIS — M79604 Pain in right leg: Secondary | ICD-10-CM | POA: Diagnosis present

## 2021-11-08 DIAGNOSIS — G894 Chronic pain syndrome: Secondary | ICD-10-CM | POA: Insufficient documentation

## 2021-11-08 DIAGNOSIS — Z79899 Other long term (current) drug therapy: Secondary | ICD-10-CM | POA: Insufficient documentation

## 2021-11-08 DIAGNOSIS — R0789 Other chest pain: Secondary | ICD-10-CM | POA: Diagnosis present

## 2021-11-08 DIAGNOSIS — M79605 Pain in left leg: Secondary | ICD-10-CM | POA: Insufficient documentation

## 2021-11-08 DIAGNOSIS — M47816 Spondylosis without myelopathy or radiculopathy, lumbar region: Secondary | ICD-10-CM | POA: Insufficient documentation

## 2021-11-08 DIAGNOSIS — G8929 Other chronic pain: Secondary | ICD-10-CM | POA: Diagnosis present

## 2021-11-08 MED ORDER — HYDROCODONE-ACETAMINOPHEN 5-325 MG PO TABS: 1.0000 | ORAL_TABLET | Freq: Two times a day (BID) | ORAL | 0 refills | Status: DC | PRN

## 2021-11-08 NOTE — Progress Notes (Signed)
Nursing Pain Medication Assessment:  ?Safety precautions to be maintained throughout the outpatient stay will include: orient to surroundings, keep bed in low position, maintain call bell within reach at all times, provide assistance with transfer out of bed and ambulation.  ?Medication Inspection Compliance: Pill count conducted under aseptic conditions, in front of the patient. Neither the pills nor the bottle was removed from the patient's sight at any time. Once count was completed pills were immediately returned to the patient in their original bottle. ? ?Medication: Hydrocodone/APAP ?Pill/Patch Count:  36 of 60 pills remain ?Pill/Patch Appearance: Markings consistent with prescribed medication ?Bottle Appearance: Standard pharmacy container. Clearly labeled. ?Filled Date: 2 / 24 / 2022 ?Last Medication intake:  TodaySafety precautions to be maintained throughout the outpatient stay will include: orient to surroundings, keep bed in low position, maintain call bell within reach at all times, provide assistance with transfer out of bed and ambulation.  ?

## 2021-11-08 NOTE — Patient Instructions (Signed)
____________________________________________________________________________________________ ° °Medication Rules ° °Purpose: To inform patients, and their family members, of our rules and regulations. ° °Applies to: All patients receiving prescriptions (written or electronic). ° °Pharmacy of record: Pharmacy where electronic prescriptions will be sent. If written prescriptions are taken to a different pharmacy, please inform the nursing staff. The pharmacy listed in the electronic medical record should be the one where you would like electronic prescriptions to be sent. ° °Electronic prescriptions: In compliance with the [place] Opioid Misuse Prevention (STOP) Act of 2017 (Session Law 2017-74/H243), effective September 03, 2018, all controlled substances must be electronically prescribed. Calling prescriptions to the pharmacy will cease to exist. ° °Prescription refills: Only during scheduled appointments. Applies to all prescriptions. ° °NOTE: The following applies primarily to controlled substances (Opioid* Pain Medications).  ° °Type of encounter (visit): For patients receiving controlled substances, face-to-face visits are required. (Not an option or up to the patient.) ° °Patient's responsibilities: °Pain Pills: Bring all pain pills to every appointment (except for procedure appointments). °Pill Bottles: Bring pills in original pharmacy bottle. Always bring the newest bottle. Bring bottle, even if empty. °Medication refills: You are responsible for knowing and keeping track of what medications you take and those you need refilled. °The day before your appointment: write a list of all prescriptions that need to be refilled. °The day of the appointment: give the list to the admitting nurse. Prescriptions will be written only during appointments. No prescriptions will be written on procedure days. °If you forget a medication: it will not be "Called in", "Faxed", or "electronically sent". You will  need to get another appointment to get these prescribed. °No early refills. Do not call asking to have your prescription filled early. °Prescription Accuracy: You are responsible for carefully inspecting your prescriptions before leaving our office. Have the discharge nurse carefully go over each prescription with you, before taking them home. Make sure that your name is accurately spelled, that your address is correct. Check the name and dose of your medication to make sure it is accurate. Check the number of pills, and the written instructions to make sure they are clear and accurate. Make sure that you are given enough medication to last until your next medication refill appointment. °Taking Medication: Take medication as prescribed. When it comes to controlled substances, taking less pills or less frequently than prescribed is permitted and encouraged. °Never take more pills than instructed. °Never take medication more frequently than prescribed.  °Inform other Doctors: Always inform, all of your healthcare providers, of all the medications you take. °Pain Medication from other Providers: You are not allowed to accept any additional pain medication from any other Doctor or Healthcare provider. There are two exceptions to this rule. (see below) In the event that you require additional pain medication, you are responsible for notifying us, as stated below. °Cough Medicine: Often these contain an opioid, such as codeine or hydrocodone. Never accept or take cough medicine containing these opioids if you are already taking an opioid* medication. The combination may cause respiratory failure and death. °Medication Agreement: You are responsible for carefully reading and following our Medication Agreement. This must be signed before receiving any prescriptions from our practice. Safely store a copy of your signed Agreement. Violations to the Agreement will result in no further prescriptions. (Additional copies of our  Medication Agreement are available upon request.) °Laws, Rules, & Regulations: All patients are expected to follow all Federal and State Laws, Statutes, Rules, & Regulations. Ignorance of   the Laws does not constitute a valid excuse.  °Illegal drugs and Controlled Substances: The use of illegal substances (including, but not limited to marijuana and its derivatives) and/or the illegal use of any controlled substances is strictly prohibited. Violation of this rule may result in the immediate and permanent discontinuation of any and all prescriptions being written by our practice. The use of any illegal substances is prohibited. °Adopted CDC guidelines & recommendations: Target dosing levels will be at or below 60 MME/day. Use of benzodiazepines** is not recommended. ° °Exceptions: There are only two exceptions to the rule of not receiving pain medications from other Healthcare Providers. °Exception #1 (Emergencies): In the event of an emergency (i.e.: accident requiring emergency care), you are allowed to receive additional pain medication. However, you are responsible for: As soon as you are able, call our office [phone], at any time of the day or night, and leave a message stating your name, the date and nature of the emergency, and the name and dose of the medication prescribed. In the event that your call is answered by a member of our staff, make sure to document and save the date, time, and the name of the person that took your information.  °Exception #2 (Planned Surgery): In the event that you are scheduled by another doctor or dentist to have any type of surgery or procedure, you are allowed (for a period no longer than 30 days), to receive additional pain medication, for the acute post-op pain. However, in this case, you are responsible for picking up a copy of our "Post-op Pain Management for Surgeons" handout, and giving it to your surgeon or dentist. This document is available at our office, and  does not require an appointment to obtain it. Simply go to our office during business hours (Monday-Thursday from 8:00 AM to 4:00 PM) (Friday 8:00 AM to 12:00 Noon) or if you have a scheduled appointment with us, prior to your surgery, and ask for it by name. In addition, you are responsible for: calling our office [phone], at any time of the day or night, and leaving a message stating your name, name of your surgeon, type of surgery, and date of procedure or surgery. Failure to comply with your responsibilities may result in termination of therapy involving the controlled substances. °Medication Agreement Violation. Following the above rules, including your responsibilities will help you in avoiding a Medication Agreement Violation (“Breaking your Pain Medication Contract”). ° °*Opioid medications include: morphine, codeine, oxycodone, oxymorphone, hydrocodone, hydromorphone, meperidine, tramadol, tapentadol, buprenorphine, fentanyl, methadone. °**Benzodiazepine medications include: diazepam (Valium), alprazolam (Xanax), clonazepam (Klonopine), lorazepam (Ativan), clorazepate (Tranxene), chlordiazepoxide (Librium), estazolam (Prosom), oxazepam (Serax), temazepam (Restoril), triazolam (Halcion) °(Last updated: 05/31/2021) °____________________________________________________________________________________________ ° ____________________________________________________________________________________________ ° °Medication Recommendations and Reminders ° °Applies to: All patients receiving prescriptions (written and/or electronic). ° °Medication Rules & Regulations: These rules and regulations exist for your safety and that of others. They are not flexible and neither are we. Dismissing or ignoring them will be considered "non-compliance" with medication therapy, resulting in complete and irreversible termination of such therapy. (See document titled "Medication Rules" for more details.) In all conscience,  because of safety reasons, we cannot continue providing a therapy where the patient does not follow instructions. ° °Pharmacy of record:  °Definition: This is the pharmacy where your electronic prescriptions will be sent.  °We do not endorse any particular pharmacy, however, we have experienced problems with [facility] not securing enough medication supply for the community. °We do not restrict you   in your choice of pharmacy. However, once we write for your prescriptions, we will NOT be re-sending more prescriptions to fix restricted supply problems created by your pharmacy, or your insurance.  °The pharmacy listed in the electronic medical record should be the one where you want electronic prescriptions to be sent. °If you choose to change pharmacy, simply notify our nursing staff. ° °Recommendations: °Keep all of your pain medications in a safe place, under lock and key, even if you live alone. We will NOT replace lost, stolen, or damaged medication. °After you fill your prescription, take 1 week's worth of pills and put them away in a safe place. You should keep a separate, properly labeled bottle for this purpose. The remainder should be kept in the original bottle. Use this as your primary supply, until it runs out. Once it's gone, then you know that you have 1 week's worth of medicine, and it is time to come in for a prescription refill. If you do this correctly, it is unlikely that you will ever run out of medicine. °To make sure that the above recommendation works, it is very important that you make sure your medication refill appointments are scheduled at least 1 week before you run out of medicine. To do this in an effective manner, make sure that you do not leave the office without scheduling your next medication management appointment. Always ask the nursing staff to show you in your prescription , when your medication will be running out. Then arrange for the receptionist to get you a return appointment,  at least 7 days before you run out of medicine. Do not wait until you have 1 or 2 pills left, to come in. This is very poor planning and does not take into consideration that we may need to cancel appointments due to bad weather, sickness, or emergencies affecting our staff. °DO NOT ACCEPT A "Partial Fill": If for any reason your pharmacy does not have enough pills/tablets to completely fill or refill your prescription, do not allow for a "partial fill". The law allows the pharmacy to complete that prescription within 72 hours, without requiring a new prescription. If they do not fill the rest of your prescription within those 72 hours, you will need a separate prescription to fill the remaining amount, which we will NOT provide. If the reason for the partial fill is your insurance, you will need to talk to the pharmacist about payment alternatives for the remaining tablets, but again, DO NOT ACCEPT A PARTIAL FILL, unless you can trust your pharmacist to obtain the remainder of the pills within 72 hours. ° °Prescription refills and/or changes in medication(s):  °Prescription refills, and/or changes in dose or medication, will be conducted only during scheduled medication management appointments. (Applies to both, written and electronic prescriptions.) °No refills on procedure days. No medication will be changed or started on procedure days. No changes, adjustments, and/or refills will be conducted on a procedure day. Doing so will interfere with the diagnostic portion of the procedure. °No phone refills. No medications will be "called into the pharmacy". °No Fax refills. °No weekend refills. °No Holliday refills. °No after hours refills. ° °Remember:  °Business hours are:  °Monday to Thursday 8:00 AM to 4:00 PM °Provider's Schedule: °Aseem Sessums, MD - Appointments are:  °Medication management: Monday and Wednesday 8:00 AM to 4:00 PM °Procedure day: Tuesday and Thursday 7:30 AM to 4:00 PM °Bilal Lateef, MD -  Appointments are:  °Medication management: Tuesday and Thursday 8:00   AM to 4:00 PM °Procedure day: Monday and Wednesday 7:30 AM to 4:00 PM °(Last update: 03/23/2020) °____________________________________________________________________________________________ ° ____________________________________________________________________________________________ ° °CBD (cannabidiol) & Delta-8 (Delta-8 tetrahydrocannabinol) WARNING ° °Intro: Cannabidiol (CBD) and tetrahydrocannabinol (THC), are two natural compounds found in plants of the Cannabis genus. They can both be extracted from hemp or cannabis. Hemp and cannabis come from the Cannabis sativa plant. Both compounds interact with your body’s endocannabinoid system, but they have very different effects. CBD does not produce the high sensation associated with cannabis. Delta-8 tetrahydrocannabinol, also known as delta-8 THC, is a psychoactive substance found in the Cannabis sativa plant, of which marijuana and hemp are two varieties. THC is responsible for the high associated with the illicit use of marijuana. ° °Applicable to: All individuals currently taking or considering taking CBD (cannabidiol) and, more important, all patients taking opioid analgesic controlled substances (pain medication). (Example: oxycodone; oxymorphone; hydrocodone; hydromorphone; morphine; methadone; tramadol; tapentadol; fentanyl; buprenorphine; butorphanol; dextromethorphan; meperidine; codeine; etc.) ° °Legal status: CBD remains a Schedule I drug prohibited for any use. CBD is illegal with one exception. In the [place], CBD has a limited Food and Drug Administration (FDA) approval for the treatment of two specific types of epilepsy disorders. Only one CBD product has been approved by the FDA for this purpose: "Epidiolex". FDA is aware that some companies are marketing products containing cannabis and cannabis-derived compounds in ways that violate the Federal Food, Drug and Cosmetic Act  (FD&C Act) and that may put the health and safety of consumers at risk. The FDA, a Federal agency, has not enforced the CBD status since 2018. UPDATE: (10/20/2021) The Drug Enforcement Agency (DEA) issued a letter stating that "delta" cannabinoids, including Delta-8-THCO and Delta-9-THCO, synthetically derived from hemp do not qualify as hemp and will be viewed as Schedule I drugs. (Schedule I drugs, substances, or chemicals are defined as drugs with no currently accepted medical use and a high potential for abuse. Some examples of Schedule I drugs are: heroin, lysergic acid diethylamide (LSD), marijuana (cannabis), 3,4-methylenedioxymethamphetamine (ecstasy), methaqualone, and peyote.) (https://www.dea.gov) ° °Legality: Some manufacturers ship CBD products nationally, which is illegal. Often such products are sold online and are therefore available throughout the country. CBD is openly sold in head shops and health food stores in some states where such sales have not been explicitly legalized. Selling unapproved products with unsubstantiated therapeutic claims is not only a violation of the law, but also can put patients at risk, as these products have not been proven to be safe or effective. Federal illegality makes it difficult to conduct research on CBD. ° °Reference: "FDA Regulation of Cannabis and Cannabis-Derived Products, Including Cannabidiol (CBD)" - https://www.fda.gov/news-events/public-health-focus/fda-regulation-cannabis-and-cannabis-derived-products-including-cannabidiol-cbd ° °Warning: CBD is not FDA approved and has not undergo the same manufacturing controls as prescription drugs.  This means that the purity and safety of available CBD may be questionable. Most of the time, despite manufacturer's claims, it is contaminated with THC (delta-9-tetrahydrocannabinol - the chemical in marijuana responsible for the "HIGH").  When this is the case, the THC contaminant will trigger a positive urine drug  screen (UDS) test for Marijuana (carboxy-THC). Because a positive UDS for any illicit substance is a violation of our medication agreement, your opioid analgesics (pain medicine) may be permanently discontinued. °The FDA recently put out a warning about 5 things that everyone should be aware of regarding Delta-8 THC: °Delta-8 THC products have not been evaluated or approved by the FDA for safe use and may be marketed in ways that put the   public health at risk. °The FDA has received adverse event reports involving delta-8 THC-containing products. °Delta-8 THC has psychoactive and intoxicating effects. °Delta-8 THC manufacturing often involve use of potentially harmful chemicals to create the concentrations of delta-8 THC claimed in the marketplace. The final delta-8 THC product may have potentially harmful by-products (contaminants) due to the chemicals used in the process. Manufacturing of delta-8 THC products may occur in uncontrolled or unsanitary settings, which may lead to the presence of unsafe contaminants or other potentially harmful substances. °Delta-8 THC products should be kept out of the reach of children and pets. ° °MORE ABOUT CBD ° °General Information: CBD was discovered in 1940 and it is a derivative of the cannabis sativa genus plants (Marijuana and Hemp). It is one of the 113 identified substances found in Marijuana. It accounts for up to 40% of the plant's extract. As of 2018, preliminary clinical studies on CBD included research for the treatment of anxiety, movement disorders, and pain. CBD is available and consumed in multiple forms, including inhalation of smoke or vapor, as an aerosol spray, and by mouth. It may be supplied as an oil containing CBD, capsules, dried cannabis, or as a liquid solution. CBD is thought not to be as psychoactive as THC (delta-9-tetrahydrocannabinol - the chemical in marijuana responsible for the "HIGH"). Studies suggest that CBD may interact with different  biological target receptors in the body, including cannabinoid and other neurotransmitter receptors. As of 2018 the mechanism of action for its biological effects has not been determined. ° °Side-effects   Adverse reactions: Dry mouth, diarrhea, decreased appetite, fatigue, drowsiness, malaise, weakness, sleep disturbances, and others. ° °Drug interactions: CBC may interact with other medications such as blood-thinners. Because CBD causes drowsiness on its own, it also increases the drowsiness caused by other medications, including antihistamines (such as Benadryl), benzodiazepines (Xanax, Ativan, Valium), antipsychotics, antidepressants and opioids, as well as alcohol and supplements such as kava, melatonin and [place]. Ndrew's Wort. Be cautious with the following combinations:  ° °Brivaracetam (Briviact) °Brivaracetam is changed and broken down by the body. CBD might decrease how quickly the body breaks down brivaracetam. This might increase levels of brivaracetam in the body. ° °Caffeine °Caffeine is changed and broken down by the body. CBD might decrease how quickly the body breaks down caffeine. This might increase levels of caffeine in the body. ° °Carbamazepine (Tegretol) °Carbamazepine is changed and broken down by the body. CBD might decrease how quickly the body breaks down carbamazepine. This might increase levels of carbamazepine in the body and increase its side effects. ° °Citalopram (Celexa) °Citalopram is changed and broken down by the body. CBD might decrease how quickly the body breaks down citalopram. This might increase levels of citalopram in the body and increase its side effects. ° °Clobazam (Onfi) °Clobazam is changed and broken down by the liver. CBD might decrease how quickly the liver breaks down clobazam. This might increase the effects and side effects of clobazam. ° °Eslicarbazepine (Aptiom) °Eslicarbazepine is changed and broken down by the body. CBD might decrease how quickly the body  breaks down eslicarbazepine. This might increase levels of eslicarbazepine in the body by a small amount. ° °Everolimus (Zostress) °Everolimus is changed and broken down by the body. CBD might decrease how quickly the body breaks down everolimus. This might increase levels of everolimus in the body. ° °Lithium °Taking higher doses of CBD might increase levels of lithium. This can increase the risk of lithium toxicity. ° °Medications changed by the   liver (Cytochrome P450 1A1 (CYP1A1) substrates) °Some medications are changed and broken down by the liver. CBD might change how quickly the liver breaks down these medications. This could change the effects and side effects of these medications. ° °Medications changed by the liver (Cytochrome P450 1A2 (CYP1A2) substrates) °Some medications are changed and broken down by the liver. CBD might change how quickly the liver breaks down these medications. This could change the effects and side effects of these medications. ° °Medications changed by the liver (Cytochrome P450 1B1 (CYP1B1) substrates) °Some medications are changed and broken down by the liver. CBD might change how quickly the liver breaks down these medications. This could change the effects and side effects of these medications. ° °Medications changed by the liver (Cytochrome P450 2A6 (CYP2A6) substrates) °Some medications are changed and broken down by the liver. CBD might change how quickly the liver breaks down these medications. This could change the effects and side effects of these medications. ° °Medications changed by the liver (Cytochrome P450 2B6 (CYP2B6) substrates) °Some medications are changed and broken down by the liver. CBD might change how quickly the liver breaks down these medications. This could change the effects and side effects of these medications. ° °Medications changed by the liver (Cytochrome P450 2C19 (CYP2C19) substrates) °Some medications are changed and broken down by the liver.  CBD might change how quickly the liver breaks down these medications. This could change the effects and side effects of these medications. ° °Medications changed by the liver (Cytochrome P450 2C8 (CYP2C8) substrates) °Some medications are changed and broken down by the liver. CBD might change how quickly the liver breaks down these medications. This could change the effects and side effects of these medications. ° °Medications changed by the liver (Cytochrome P450 2C9 (CYP2C9) substrates) °Some medications are changed and broken down by the liver. CBD might change how quickly the liver breaks down these medications. This could change the effects and side effects of these medications. ° °Medications changed by the liver (Cytochrome P450 2D6 (CYP2D6) substrates) °Some medications are changed and broken down by the liver. CBD might change how quickly the liver breaks down these medications. This could change the effects and side effects of these medications. ° °Medications changed by the liver (Cytochrome P450 2E1 (CYP2E1) substrates) °Some medications are changed and broken down by the liver. CBD might change how quickly the liver breaks down these medications. This could change the effects and side effects of these medications. ° °Medications changed by the liver (Cytochrome P450 3A4 (CYP3A4) substrates) °Some medications are changed and broken down by the liver. CBD might change how quickly the liver breaks down these medications. This could change the effects and side effects of these medications. ° °Medications changed by the liver (Glucuronidated drugs) °Some medications are changed and broken down by the liver. CBD might change how quickly the liver breaks down these medications. This could change the effects and side effects of these medications. ° °Medications that decrease the breakdown of other medications by the liver (Cytochrome P450 2C19 (CYP2C19) inhibitors) °CBD is changed and broken down by the liver.  Some drugs decrease how quickly the liver changes and breaks down CBD. This could change the effects and side effects of CBD. ° °Medications that decrease the breakdown of other medications in the liver (Cytochrome P450 3A4 (CYP3A4) inhibitors) °CBD is changed and broken down by the liver. Some drugs decrease how quickly the liver changes and breaks down CBD. This could change the   effects and side effects of CBD. ° °Medications that increase breakdown of other medications by the liver (Cytochrome P450 3A4 (CYP3A4) inducers) °CBD is changed and broken down by the liver. Some drugs increase how quickly the liver changes and breaks down CBD. This could change the effects and side effects of CBD. ° °Medications that increase the breakdown of other medications by the liver (Cytochrome P450 2C19 (CYP2C19) inducers) °CBD is changed and broken down by the liver. Some drugs increase how quickly the liver changes and breaks down CBD. This could change the effects and side effects of CBD. ° °Methadone (Dolophine) °Methadone is broken down by the liver. CBD might decrease how quickly the liver breaks down methadone. Taking cannabidiol along with methadone might increase the effects and side effects of methadone. ° °Rufinamide (Banzel) °Rufinamide is changed and broken down by the body. CBD might decrease how quickly the body breaks down rufinamide. This might increase levels of rufinamide in the body by a small amount. ° °Sedative medications (CNS depressants) °CBD might cause sleepiness and slowed breathing. Some medications, called sedatives, can also cause sleepiness and slowed breathing. Taking CBD with sedative medications might cause breathing problems and/or too much sleepiness. ° °Sirolimus (Rapamune) °Sirolimus is changed and broken down by the body. CBD might decrease how quickly the body breaks down sirolimus. This might increase levels of sirolimus in the body. ° °Stiripentol (Diacomit) °Stiripentol is changed and  broken down by the body. CBD might decrease how quickly the body breaks down stiripentol. This might increase levels of stiripentol in the body and increase its side effects. ° °Tacrolimus (Prograf) °Tacrolimus is changed and broken down by the body. CBD might decrease how quickly the body breaks down tacrolimus. This might increase levels of tacrolimus in the body. ° °Tamoxifen (Soltamox) °Tamoxifen is changed and broken down by the body. CBD might affect how quickly the body breaks down tamoxifen. This might affect levels of tamoxifen in the body. ° °Topiramate (Topamax) °Topiramate is changed and broken down by the body. CBD might decrease how quickly the body breaks down topiramate. This might increase levels of topiramate in the body by a small amount. ° °Valproate °Valproic acid can cause liver injury. Taking cannabidiol with valproic acid might increase the chance of liver injury. CBD and/or valproic acid might need to be stopped, or the dose might need to be reduced. ° °Warfarin (Coumadin) °CBD might increase levels of warfarin, which can increase the risk for bleeding. CBD and/or warfarin might need to be stopped, or the dose might need to be reduced. ° °Zonisamide °Zonisamide is changed and broken down by the body. CBD might decrease how quickly the body breaks down zonisamide. This might increase levels of zonisamide in the body by a small amount. °(Last update: 11/01/2021) °____________________________________________________________________________________________ ° ____________________________________________________________________________________________ ° °Drug Holidays (Slow) ° °What is a "Drug Holiday"? °Drug Holiday: is the name given to the period of time during which a patient stops taking a medication(s) for the purpose of eliminating tolerance to the drug. ° °Benefits °Improved effectiveness of opioids. °Decreased opioid dose needed to achieve benefits. °Improved pain with lesser  dose. ° °What is tolerance? °Tolerance: is the progressive decreased in effectiveness of a drug due to its repetitive use. With repetitive use, the body gets use to the medication and as a consequence, it loses its effectiveness. This is a common problem seen with opioid pain medications. As a result, a larger dose of the drug is needed to achieve the same effect   that used to be obtained with a smaller dose. ° °How long should a "Drug Holiday" last? °You should stay off of the pain medicine for at least 14 consecutive days. (2 weeks) ° °Should I stop the medicine "cold turkey"? °No. You should always coordinate with your Pain Specialist so that he/she can provide you with the correct medication dose to make the transition as smoothly as possible. ° °How do I stop the medicine? °Slowly. You will be instructed to decrease the daily amount of pills that you take by one (1) pill every seven (7) days. This is called a "slow downward taper" of your dose. For example: if you normally take four (4) pills per day, you will be asked to drop this dose to three (3) pills per day for seven (7) days, then to two (2) pills per day for seven (7) days, then to one (1) per day for seven (7) days, and at the end of those last seven (7) days, this is when the "Drug Holiday" would start.  ° °Will I have withdrawals? °By doing a "slow downward taper" like this one, it is unlikely that you will experience any significant withdrawal symptoms. Typically, what triggers withdrawals is the sudden stop of a high dose opioid therapy. Withdrawals can usually be avoided by slowly decreasing the dose over a prolonged period of time. If you do not follow these instructions and decide to stop your medication abruptly, withdrawals may be possible. ° °What are withdrawals? °Withdrawals: refers to the wide range of symptoms that occur after stopping or dramatically reducing opiate drugs after heavy and prolonged use. Withdrawal symptoms do not occur to  patients that use low dose opioids, or those who take the medication sporadically. Contrary to benzodiazepine (example: Valium, Xanax, etc.) or alcohol withdrawals (“Delirium Tremens”), opioid withdrawals are not lethal. Withdrawals are the physical manifestation of the body getting rid of the excess receptors. ° °Expected Symptoms °Early symptoms of withdrawal may include: °Agitation °Anxiety °Muscle aches °Increased tearing °Insomnia °Runny nose °Sweating °Yawning ° °Late symptoms of withdrawal may include: °Abdominal cramping °Diarrhea °Dilated pupils °Goose bumps °Nausea °Vomiting ° °Will I experience withdrawals? °Due to the slow nature of the taper, it is very unlikely that you will experience any. ° °What is a slow taper? °Taper: refers to the gradual decrease in dose.  °(Last update: 03/23/2020) °____________________________________________________________________________________________ ° °  °

## 2021-11-10 ENCOUNTER — Other Ambulatory Visit: Payer: Self-pay

## 2021-11-10 ENCOUNTER — Other Ambulatory Visit (INDEPENDENT_AMBULATORY_CARE_PROVIDER_SITE_OTHER): Payer: Self-pay | Admitting: Vascular Surgery

## 2021-11-10 ENCOUNTER — Ambulatory Visit (INDEPENDENT_AMBULATORY_CARE_PROVIDER_SITE_OTHER): Payer: Medicare Other | Admitting: Vascular Surgery

## 2021-11-10 ENCOUNTER — Ambulatory Visit (INDEPENDENT_AMBULATORY_CARE_PROVIDER_SITE_OTHER): Payer: Medicare Other

## 2021-11-10 ENCOUNTER — Encounter (INDEPENDENT_AMBULATORY_CARE_PROVIDER_SITE_OTHER): Payer: Self-pay | Admitting: Vascular Surgery

## 2021-11-10 VITALS — BP 165/74 | HR 78 | Resp 17 | Ht 63.0 in | Wt 161.0 lb

## 2021-11-10 DIAGNOSIS — N1832 Chronic kidney disease, stage 3b: Secondary | ICD-10-CM

## 2021-11-10 DIAGNOSIS — R6889 Other general symptoms and signs: Secondary | ICD-10-CM

## 2021-11-10 DIAGNOSIS — E785 Hyperlipidemia, unspecified: Secondary | ICD-10-CM

## 2021-11-10 DIAGNOSIS — I739 Peripheral vascular disease, unspecified: Secondary | ICD-10-CM

## 2021-11-10 DIAGNOSIS — I10 Essential (primary) hypertension: Secondary | ICD-10-CM

## 2021-11-10 DIAGNOSIS — Z794 Long term (current) use of insulin: Secondary | ICD-10-CM

## 2021-11-10 DIAGNOSIS — E1122 Type 2 diabetes mellitus with diabetic chronic kidney disease: Secondary | ICD-10-CM

## 2021-11-10 DIAGNOSIS — I70213 Atherosclerosis of native arteries of extremities with intermittent claudication, bilateral legs: Secondary | ICD-10-CM | POA: Diagnosis not present

## 2021-11-10 NOTE — H&P (View-Only) (Signed)
? ? ?Patient ID: [name] Seeds., male   DOB: 1956/02/10, 66 y.o.   MRN: [id number] ? ?Chief Complaint  ?Patient presents with  ? New Patient (Initial Visit)  ?  Consult and ABI  ? ? ?HPI ?[name] Seeds. is a 66 y.o. male.  I am asked to see the patient by Dr. Corky Sox for evaluation of peripheral arterial disease and claudication.  Patient reports some problems with his legs for many years.  He describes pain in his hip and buttocks with activity as well as cramping in his calves.  He has some pain starting in the knee and radiating down to his foot that is present all the time.  He says this part is worse on the right leg on the left, but both legs give him some difficulty with activity.  He does not have any open wounds or infection.  No gangrenous changes.  The pain is not typical for ischemic rest pain. ?To evaluate him for arterial occlusive disease, ABIs and lower extremity duplex were done today.  The right ABI is 1.07 and the left ABI 0.68.  Duplex shows 50 to 74% stenosis in the right SFA/popliteal artery and 75 to 99% stenosis in the left SFA/popliteal artery.  Also, the waveforms are monophasic to biphasic more proximally which is indicative of potential aortoiliac disease as well.   ? ? ?Past Medical History:  ?Diagnosis Date  ? Benign esophageal stricture   ? Cardiomyopathy, secondary ([place])   ? Coronary artery disease   ? Diabetes mellitus without complication ([place])   ? Diverticulitis   ? GI bleed   ? Gout   ? Hemorrhoids   ? Hyperlipidemia   ? Hypertension   ? Sleep apnea   ? Tubular adenoma of colon   ? ? ?Past Surgical History:  ?Procedure Laterality Date  ? cardiac bypass    ? CARDIAC CATHETERIZATION Left 02/03/2016  ? Procedure: Left Heart Cath and Coronary Angiography;  Surgeon: Teodoro Spray, MD;  Location: [facility];  Service: Cardiovascular;  Laterality: Left;  ? CARDIAC SURGERY    ? COLONOSCOPY WITH PROPOFOL N/A 04/29/2015  ? Procedure: COLONOSCOPY WITH PROPOFOL;  Surgeon: Hulen Luster, MD;  Location: [facility] ENDOSCOPY;  Service: Gastroenterology;  Laterality: N/A;  ? COLONOSCOPY WITH PROPOFOL N/A 07/20/2020  ? Procedure: COLONOSCOPY WITH PROPOFOL;  Surgeon: Toledo, Benay Pike, MD;  Location: [facility] ENDOSCOPY;  Service: Gastroenterology;  Laterality: N/A;  ? CORONARY ANGIOPLASTY WITH STENT PLACEMENT    ? x2  ? CORONARY ARTERY BYPASS GRAFT    ? 2013 & 2017  ? ESOPHAGOGASTRODUODENOSCOPY (EGD) WITH PROPOFOL N/A 04/29/2015  ? Procedure: ESOPHAGOGASTRODUODENOSCOPY (EGD) WITH PROPOFOL;  Surgeon: Hulen Luster, MD;  Location: [facility]COPY;  Service: Gastroenterology;  Laterality: N/A;  ? SEPTOPLASTY    ? ? ? ?Family History  ?Problem Relation Age of Onset  ? Heart disease Mother   ? Hypertension Father   ?No bleeding or clotting disorders ? ? ?Social History  ? ?Tobacco Use  ? Smoking status: Former  ?  Years: 25.00  ?  Types: Cigarettes  ? Smokeless tobacco: Former  ?Substance Use Topics  ? Alcohol use: Yes  ?  Comment: occasional  ? Drug use: No  ? ? ? ?No Known Allergies ? ?Current Outpatient Medications  ?Medication Sig Dispense Refill  ? allopurinol (ZYLOPRIM) 100 MG tablet Take 100 mg by mouth daily.     ? aspirin EC 81 MG tablet Take 81 mg  by mouth daily. Swallow whole.    ? atorvastatin (LIPITOR) 40 MG tablet Take 40 mg by mouth at bedtime.     ? B-D UF III MINI PEN NEEDLES 31G X 5 MM MISC 50 mm 2 (two) times daily. 4 mm needles    ? BD PEN NEEDLE NANO 2ND GEN 32G X 4 MM MISC Inject 1 applicator into the skin in the morning and at bedtime.    ? Cholecalciferol 25 MCG (1000 UT) tablet Take 2,000 Units by mouth daily.    ? hydrochlorothiazide (HYDRODIURIL) 25 MG tablet Take 25 mg by mouth daily.    ? [START ON 11/26/2021] HYDROcodone-acetaminophen (NORCO/VICODIN) 5-325 MG tablet Take 1 tablet by mouth 2 (two) times daily as needed for severe pain. Must last 30 days 60 tablet 0  ? [START ON 12/26/2021] HYDROcodone-acetaminophen (NORCO/VICODIN) 5-325 MG tablet Take 1 tablet by mouth 2 (two) times daily as  needed for severe pain. Must last 30 days 60 tablet 0  ? [START ON 01/25/2022] HYDROcodone-acetaminophen (NORCO/VICODIN) 5-325 MG tablet Take 1 tablet by mouth 2 (two) times daily as needed for severe pain. Must last 30 days 60 tablet 0  ? insulin detemir (LEVEMIR) 100 UNIT/ML FlexPen Inject 30 Units into the skin at bedtime. (Patient taking differently: Inject 50 Units into the skin 2 (two) times daily. 50 units in the morning 53 units at bedtime) 15 mL   ? isosorbide mononitrate (IMDUR) 30 MG 24 hr tablet Take 30 mg by mouth every morning.   1  ? losartan (COZAAR) 100 MG tablet Take 100 mg by mouth at bedtime.    ? metoprolol tartrate (LOPRESSOR) 50 MG tablet Take 1 tablet (50 mg total) by mouth 2 (two) times daily. (Patient taking differently: Take 50 mg by mouth 3 (three) times daily.)    ? Multiple Vitamin (MULTIVITAMIN WITH MINERALS) TABS tablet Take 1 tablet by mouth every evening.    ? nitroGLYCERIN (NITROSTAT) 0.4 MG SL tablet Place 0.4 mg under the tongue every 5 (five) minutes x 3 doses as needed for chest pain.     ? OZEMPIC, 0.25 OR 0.5 MG/DOSE, 2 MG/1.5ML SOPN Inject 1 mg into the skin once a week.    ? Potassium 99 MG TABS Take 1 tablet by mouth daily.    ? gabapentin (NEURONTIN) 600 MG tablet Take 1 tablet (600 mg total) by mouth 2 (two) times daily. 60 tablet 2  ? pantoprazole (PROTONIX) 40 MG tablet Take 1 tablet (40 mg total) by mouth daily. 30 tablet 0  ? ?No current facility-administered medications for this visit.  ? ? ? ? ?REVIEW OF SYSTEMS (Negative unless checked) ? ?Constitutional: '[]'$ Weight loss  '[]'$ Fever  '[]'$ Chills ?Cardiac: '[]'$ Chest pain   '[]'$ Chest pressure   '[]'$ Palpitations   '[]'$ Shortness of breath when laying flat   '[]'$ Shortness of breath at rest   '[x]'$ Shortness of breath with exertion. ?Vascular:  '[]'$ Pain in legs with walking   '[]'$ Pain in legs at rest   '[]'$ Pain in legs when laying flat   '[]'$ Claudication   '[]'$ Pain in feet when walking  '[]'$ Pain in feet at rest  '[]'$ Pain in feet when laying flat    '[]'$ History of DVT   '[]'$ Phlebitis   '[]'$ Swelling in legs   '[]'$ Varicose veins   '[]'$ Non-healing ulcers ?Pulmonary:   '[]'$ Uses home oxygen   '[]'$ Productive cough   '[]'$ Hemoptysis   '[]'$ Wheeze  '[]'$ COPD   '[]'$ Asthma ?Neurologic:  '[]'$ Dizziness  '[]'$ Blackouts   '[]'$ Seizures   '[]'$ History of stroke   '[]'$ History of TIA  '[]'$   Aphasia   '[]'$ Temporary blindness   '[]'$ Dysphagia   '[]'$ Weakness or numbness in arms   '[]'$ Weakness or numbness in legs ?Musculoskeletal:  '[x]'$ Arthritis   '[]'$ Joint swelling   '[]'$ Joint pain   '[]'$ Low back pain ?Hematologic:  '[]'$ Easy bruising  '[]'$ Easy bleeding   '[]'$ Hypercoagulable state   '[]'$ Anemic  '[]'$ Hepatitis ?Gastrointestinal:  '[x]'$ Blood in stool   '[]'$ Vomiting blood  '[]'$ Gastroesophageal reflux/heartburn   '[]'$ Abdominal pain ?Genitourinary:  '[]'$ Chronic kidney disease   '[]'$ Difficult urination  '[]'$ Frequent urination  '[]'$ Burning with urination   '[]'$ Hematuria ?Skin:  '[]'$ Rashes   '[]'$ Ulcers   '[]'$ Wounds ?Psychological:  '[]'$ History of anxiety   '[]'$  History of major depression. ? ? ? ?Physical Exam ?BP (!) 165/74 (BP Location: Left Arm)   Pulse 78   Resp 17   Ht '5\' 3"'$  (1.6 m)   Wt 161 lb (73 kg)   BMI 28.52 kg/m?  ?Gen:  WD/WN, NAD. Appears older than stated age. ?Head: [place]/AT, No temporalis wasting.  ?Ear/Nose/Throat: Hearing grossly intact, nares w/o erythema or drainage, oropharynx w/o Erythema/Exudate ?Eyes: Conjunctiva clear, sclera non-icteric  ?Neck: trachea midline.  No JVD.  ?Pulmonary:  Good air movement, respirations not labored, no use of accessory muscles  ?Cardiac: RRR, no JVD ?Vascular:  ?Vessel Right Left  ?Radial Palpable Palpable  ?    ?    ?    ?    ?    ?    ?DP 2+ 1+  ?PT 1+ 1+  ? ?Gastrointestinal:. No masses, surgical incisions, or scars. ?Musculoskeletal: M/S 5/5 throughout.  Extremities without ischemic changes.  No deformity or atrophy. Trace LE edema. ?Neurologic: Sensation grossly intact in extremities.  Symmetrical.  Speech is fluent. Motor exam as listed above. ?Psychiatric: Judgment intact, Mood & affect appropriate for pt's clinical  situation. ?Dermatologic: No rashes or ulcers noted.  No cellulitis or open wounds. ? ? ? ?Radiology ?No results found. ? ?Labs ?No results found for this or any previous visit (from the past 2160 hour(s)). ? ?Assessmen

## 2021-11-10 NOTE — Progress Notes (Signed)
Patient ID: [name] Seeds., male   DOB: 15-Feb-1956, 66 y.o.   MRN: [id number]  Chief Complaint  Patient presents with   New Patient (Initial Visit)    Consult and ABI    HPI [name] Leyda. is a 66 y.o. male.  I am asked to see the patient by Dr. Corky Sox for evaluation of peripheral arterial disease and claudication.  Patient reports some problems with his legs for many years.  He describes pain in his hip and buttocks with activity as well as cramping in his calves.  He has some pain starting in the knee and radiating down to his foot that is present all the time.  He says this part is worse on the right leg on the left, but both legs give him some difficulty with activity.  He does not have any open wounds or infection.  No gangrenous changes.  The pain is not typical for ischemic rest pain. To evaluate him for arterial occlusive disease, ABIs and lower extremity duplex were done today.  The right ABI is 1.07 and the left ABI 0.68.  Duplex shows 50 to 74% stenosis in the right SFA/popliteal artery and 75 to 99% stenosis in the left SFA/popliteal artery.  Also, the waveforms are monophasic to biphasic more proximally which is indicative of potential aortoiliac disease as well.     Past Medical History:  Diagnosis Date   Benign esophageal stricture    Cardiomyopathy, secondary ([place])    Coronary artery disease    Diabetes mellitus without complication ([place])    Diverticulitis    GI bleed    Gout    Hemorrhoids    Hyperlipidemia    Hypertension    Sleep apnea    Tubular adenoma of colon     Past Surgical History:  Procedure Laterality Date   cardiac bypass     CARDIAC CATHETERIZATION Left 02/03/2016   Procedure: Left Heart Cath and Coronary Angiography;  Surgeon: Teodoro Spray, MD;  Location: [facility];  Service: Cardiovascular;  Laterality: Left;   CARDIAC SURGERY     COLONOSCOPY WITH PROPOFOL N/A 04/29/2015   Procedure: COLONOSCOPY WITH PROPOFOL;  Surgeon: Hulen Luster, MD;  Location: [facility] ENDOSCOPY;  Service: Gastroenterology;  Laterality: N/A;   COLONOSCOPY WITH PROPOFOL N/A 07/20/2020   Procedure: COLONOSCOPY WITH PROPOFOL;  Surgeon: Toledo, Benay Pike, MD;  Location: [facility] ENDOSCOPY;  Service: Gastroenterology;  Laterality: N/A;   CORONARY ANGIOPLASTY WITH STENT PLACEMENT     x2   CORONARY ARTERY BYPASS GRAFT     2013 & 2017   ESOPHAGOGASTRODUODENOSCOPY (EGD) WITH PROPOFOL N/A 04/29/2015   Procedure: ESOPHAGOGASTRODUODENOSCOPY (EGD) WITH PROPOFOL;  Surgeon: Hulen Luster, MD;  Location: [facility] ENDOSCOPY;  Service: Gastroenterology;  Laterality: N/A;   SEPTOPLASTY       Family History  Problem Relation Age of Onset   Heart disease Mother    Hypertension Father   No bleeding or clotting disorders   Social History   Tobacco Use   Smoking status: Former    Years: 25.00    Types: Cigarettes   Smokeless tobacco: Former  Substance Use Topics   Alcohol use: Yes    Comment: occasional   Drug use: No     No Known Allergies  Current Outpatient Medications  Medication Sig Dispense Refill   allopurinol (ZYLOPRIM) 100 MG tablet Take 100 mg by mouth daily.      aspirin EC 81 MG tablet Take 81 mg  by mouth daily. Swallow whole.     atorvastatin (LIPITOR) 40 MG tablet Take 40 mg by mouth at bedtime.      B-D UF III MINI PEN NEEDLES 31G X 5 MM MISC 50 mm 2 (two) times daily. 4 mm needles     BD PEN NEEDLE NANO 2ND GEN 32G X 4 MM MISC Inject 1 applicator into the skin in the morning and at bedtime.     Cholecalciferol 25 MCG (1000 UT) tablet Take 2,000 Units by mouth daily.     hydrochlorothiazide (HYDRODIURIL) 25 MG tablet Take 25 mg by mouth daily.     [START ON 11/26/2021] HYDROcodone-acetaminophen (NORCO/VICODIN) 5-325 MG tablet Take 1 tablet by mouth 2 (two) times daily as needed for severe pain. Must last 30 days 60 tablet 0   [START ON 12/26/2021] HYDROcodone-acetaminophen (NORCO/VICODIN) 5-325 MG tablet Take 1 tablet by mouth 2 (two) times daily as  needed for severe pain. Must last 30 days 60 tablet 0   [START ON 01/25/2022] HYDROcodone-acetaminophen (NORCO/VICODIN) 5-325 MG tablet Take 1 tablet by mouth 2 (two) times daily as needed for severe pain. Must last 30 days 60 tablet 0   insulin detemir (LEVEMIR) 100 UNIT/ML FlexPen Inject 30 Units into the skin at bedtime. (Patient taking differently: Inject 50 Units into the skin 2 (two) times daily. 50 units in the morning 53 units at bedtime) 15 mL    isosorbide mononitrate (IMDUR) 30 MG 24 hr tablet Take 30 mg by mouth every morning.   1   losartan (COZAAR) 100 MG tablet Take 100 mg by mouth at bedtime.     metoprolol tartrate (LOPRESSOR) 50 MG tablet Take 1 tablet (50 mg total) by mouth 2 (two) times daily. (Patient taking differently: Take 50 mg by mouth 3 (three) times daily.)     Multiple Vitamin (MULTIVITAMIN WITH MINERALS) TABS tablet Take 1 tablet by mouth every evening.     nitroGLYCERIN (NITROSTAT) 0.4 MG SL tablet Place 0.4 mg under the tongue every 5 (five) minutes x 3 doses as needed for chest pain.      OZEMPIC, 0.25 OR 0.5 MG/DOSE, 2 MG/1.5ML SOPN Inject 1 mg into the skin once a week.     Potassium 99 MG TABS Take 1 tablet by mouth daily.     gabapentin (NEURONTIN) 600 MG tablet Take 1 tablet (600 mg total) by mouth 2 (two) times daily. 60 tablet 2   pantoprazole (PROTONIX) 40 MG tablet Take 1 tablet (40 mg total) by mouth daily. 30 tablet 0   No current facility-administered medications for this visit.      REVIEW OF SYSTEMS (Negative unless checked)  Constitutional: '[]'$ Weight loss  '[]'$ Fever  '[]'$ Chills Cardiac: '[]'$ Chest pain   '[]'$ Chest pressure   '[]'$ Palpitations   '[]'$ Shortness of breath when laying flat   '[]'$ Shortness of breath at rest   '[x]'$ Shortness of breath with exertion. Vascular:  '[]'$ Pain in legs with walking   '[]'$ Pain in legs at rest   '[]'$ Pain in legs when laying flat   '[]'$ Claudication   '[]'$ Pain in feet when walking  '[]'$ Pain in feet at rest  '[]'$ Pain in feet when laying flat    '[]'$ History of DVT   '[]'$ Phlebitis   '[]'$ Swelling in legs   '[]'$ Varicose veins   '[]'$ Non-healing ulcers Pulmonary:   '[]'$ Uses home oxygen   '[]'$ Productive cough   '[]'$ Hemoptysis   '[]'$ Wheeze  '[]'$ COPD   '[]'$ Asthma Neurologic:  '[]'$ Dizziness  '[]'$ Blackouts   '[]'$ Seizures   '[]'$ History of stroke   '[]'$ History of TIA  '[]'$   Aphasia   '[]'$ Temporary blindness   '[]'$ Dysphagia   '[]'$ Weakness or numbness in arms   '[]'$ Weakness or numbness in legs Musculoskeletal:  '[x]'$ Arthritis   '[]'$ Joint swelling   '[]'$ Joint pain   '[]'$ Low back pain Hematologic:  '[]'$ Easy bruising  '[]'$ Easy bleeding   '[]'$ Hypercoagulable state   '[]'$ Anemic  '[]'$ Hepatitis Gastrointestinal:  '[x]'$ Blood in stool   '[]'$ Vomiting blood  '[]'$ Gastroesophageal reflux/heartburn   '[]'$ Abdominal pain Genitourinary:  '[]'$ Chronic kidney disease   '[]'$ Difficult urination  '[]'$ Frequent urination  '[]'$ Burning with urination   '[]'$ Hematuria Skin:  '[]'$ Rashes   '[]'$ Ulcers   '[]'$ Wounds Psychological:  '[]'$ History of anxiety   '[]'$  History of major depression.    Physical Exam BP (!) 165/74 (BP Location: Left Arm)    Pulse 78    Resp 17    Ht '5\' 3"'$  (1.6 m)    Wt 161 lb (73 kg)    BMI 28.52 kg/m  Gen:  WD/WN, NAD. Appears older than stated age. Head: [place]/AT, No temporalis wasting.  Ear/Nose/Throat: Hearing grossly intact, nares w/o erythema or drainage, oropharynx w/o Erythema/Exudate Eyes: Conjunctiva clear, sclera non-icteric  Neck: trachea midline.  No JVD.  Pulmonary:  Good air movement, respirations not labored, no use of accessory muscles  Cardiac: RRR, no JVD Vascular:  Vessel Right Left  Radial Palpable Palpable                          DP 2+ 1+  PT 1+ 1+   Gastrointestinal:. No masses, surgical incisions, or scars. Musculoskeletal: M/S 5/5 throughout.  Extremities without ischemic changes.  No deformity or atrophy. Trace LE edema. Neurologic: Sensation grossly intact in extremities.  Symmetrical.  Speech is fluent. Motor exam as listed above. Psychiatric: Judgment intact, Mood & affect appropriate for pt's clinical  situation. Dermatologic: No rashes or ulcers noted.  No cellulitis or open wounds.    Radiology No results found.  Labs No results found for this or any previous visit (from the past 2160 hour(s)).  Assessment/Plan:  Essential hypertension blood pressure control important in reducing the progression of atherosclerotic disease. On appropriate oral medications.   DM2 (diabetes mellitus, type 2) (HCC) blood glucose control important in reducing the progression of atherosclerotic disease. Also, involved in wound healing. On appropriate medications.   Hyperlipidemia lipid control important in reducing the progression of atherosclerotic disease. Continue statin therapy   Atherosclerosis of native arteries of extremity with intermittent claudication ([place]) To evaluate him for arterial occlusive disease, ABIs and lower extremity duplex were done today.  The right ABI is 1.07 and the left ABI 0.68.  Duplex shows 50 to 74% stenosis in the right SFA/popliteal artery and 75 to 99% stenosis in the left SFA/popliteal artery.  Also, the waveforms are monophasic to biphasic more proximally which is indicative of potential aortoiliac disease as well.   We had a long discussion today regarding the pathophysiology and natural history of peripheral arterial disease.  I discussed that I think his pain is multifactorial with a significant neuropathic component present as well as some degree of arterial insufficiency creating claudication as well.  I told him the pain at rest was most likely not due to arterial insufficiency.  I did discuss that with the findings, he does have peripheral arterial disease and consideration for intervention is reasonable with the understanding that his pain will not be resolved or cured but only improved, and particularly the pain with activity and difficulty walking.  I have also discussed that he is  in a stable situation where appropriate medical therapy alone is adequate if he  would like to continue that.  He prefers to have intervention which is reasonable.  I have discussed the risk and benefits of the procedure.  The patient voices his understanding and desires to proceed with staged bilateral lower extremity angiograms with possible intervention      Leotis Pain 11/10/2021, 9:40 AM   This note was created with Dragon medical transcription system.  Any errors from dictation are unintentional.

## 2021-11-10 NOTE — Assessment & Plan Note (Signed)
blood pressure control important in reducing the progression of atherosclerotic disease. On appropriate oral medications.  

## 2021-11-10 NOTE — Patient Instructions (Signed)
Angiogram ?An angiogram is a procedure used to examine the blood vessels. In this procedure, contrast dye is injected through a long, thin tube (catheter) into an artery. X-rays are then taken, which show if there is a blockage or problem in a blood vessel. ?The catheter may be inserted in: ?The groin area. This is the most common. ?The fold of the arm, near the elbow. ?The wrist. ?Tell a health care provider about: ?Any allergies you have, including allergies to medicines, shellfish, contrast dye, or iodine. ?All medicines you are taking, including vitamins, herbs, eye drops, creams, and over-the-counter medicines. ?Any problems you or family members have had with anesthetic medicines. ?Any blood disorders you have. ?Any surgeries you have had. ?Any medical conditions you have or have had, including any kidney problems or kidney failure. ?Whether you are pregnant or may be pregnant. ?Whether you are breastfeeding. ?What are the risks? ?Generally, this is a safe procedure. However, problems may occur, including: ?Infection. ?Bleeding and bruising. ?Allergic reactions to medicines or dyes, including the contrast dye used. ?Damage to nearby structures or organs, including damage to blood vessels and kidney damage from the contrast dye. ?Blood clots that can lead to a stroke or heart attack. ?What happens before the procedure? ?Staying hydrated ?Follow instructions from your health care provider about hydration, which may include: ?Up to 2 hours before the procedure - you may continue to drink clear liquids, such as water, clear fruit juice, black coffee, and plain tea. ? ?Eating and drinking restrictions ?Follow instructions from your health care provider about eating and drinking, which may include: ?8 hours before the procedure - stop eating heavy meals or foods, such as meat, fried foods, or fatty foods. ?6 hours before the procedure - stop eating light meals or foods, such as toast or cereal. ?2 hours before the  procedure - stop drinking clear liquids. ?Medicines ?Ask your health care provider about: ?Changing or stopping your regular medicines. This is especially important if you are taking diabetes medicines or blood thinners. ?Taking medicines such as aspirin and ibuprofen. These medicines can thin your blood. Do not take these medicines unless your health care provider tells you to take them. ?Taking over-the-counter medicines, vitamins, herbs, and supplements. ?Surgery safety ?Ask your health care provider: ?How your insertion site will be marked. ?What steps will be taken to help prevent infection. These may include: ?Removing hair at the insertion site. ?Washing skin with a germ-killing soap. ?Taking antibiotic medicine. ?General instructions ?Do not use any products that contain nicotine or tobacco for at least 4 weeks before the procedure. These products include cigarettes, e-cigarettes, and chewing tobacco. If you need help quitting, ask your health care provider. ?You may have blood samples taken. ?Plan to have someone take you home from the hospital or clinic. ?If you will be going home right after the procedure, plan to have someone with you for 24 hours. ?What happens during the procedure? ?You will lie on your back on an X-ray table. You may be strapped to the table if it is tilted. ?An IV will be inserted into one of your veins. ?Electrodes may be placed on your chest to monitor your heart rate during the procedure. ?You will be given one or both of the following: ?A medicine to help you relax (sedative). ?A medicine to numb the area where the catheter will be inserted (local anesthetic). ?A small incision will be made for catheter insertion. ?The catheter will be inserted into an artery using a guide  wire. An X-ray procedure (fluoroscopy) will be used to help guide the catheter to the blood vessel to be examined. ?A contrast dye will then be injected into the catheter, and X-rays will be taken. The contrast  will help to show where any narrowing or blockages are located in the blood vessels. You may feel flushed as the contrast dye is injected. ?Tell your health care provider if you develop chest pain or trouble breathing. ?After the fluoroscopy is complete, the catheter will be removed. ?A bandage (dressing) will be placed over the site where the catheter was inserted. Pressure will be applied to help stop any bleeding. ?The IV will be removed. ?The procedure may vary among health care providers and hospitals. ?What happens after the procedure? ?Your blood pressure, heart rate, breathing rate, and blood oxygen level will be monitored until you leave the hospital or clinic. ?You will be kept in bed lying flat for 6 hours. If the catheter was inserted through your leg, you will be instructed not to bend or cross your legs. ?The insertion area and the pulse in your feet or wrist will be checked frequently. ?You will be instructed to drink plenty of fluids. This will help wash the contrast dye out of your body. ?You may have more blood tests and X-rays. You may also have a test that records the electrical activity of your heart (electrocardiogram, or ECG). ?Do not drive for 24 hours if you were given a sedative during your procedure. ?It is up to you to get the results of your procedure. Ask your health care provider, or the department that is doing the procedure, when your results will be ready. ?Summary ?An angiogram is a procedure used to examine the blood vessels. ?Before the procedure, follow your health care provider's instructions about eating and drinking restrictions. You may be asked to stop eating and drinking several hours before the procedure. ?During the procedure, contrast dye is injected through a thin tube (catheter) into an artery. X-rays are then taken. ?After the procedure, you will need to drink plenty of fluids and lie flat for 6 hour. ?This information is not intended to replace advice given to you  by your health care provider. Make sure you discuss any questions you have with your health care provider. ?Document Revised: 03/04/2019 Document Reviewed: 03/04/2019 ?Elsevier Patient Education ? [place]. ? ?

## 2021-11-10 NOTE — Assessment & Plan Note (Signed)
lipid control important in reducing the progression of atherosclerotic disease. Continue statin therapy  

## 2021-11-10 NOTE — Assessment & Plan Note (Signed)
blood glucose control important in reducing the progression of atherosclerotic disease. Also, involved in wound healing. On appropriate medications.  

## 2021-11-10 NOTE — Assessment & Plan Note (Signed)
To evaluate him for arterial occlusive disease, ABIs and lower extremity duplex were done today.  The right ABI is 1.07 and the left ABI 0.68.  Duplex shows 50 to 74% stenosis in the right SFA/popliteal artery and 75 to 99% stenosis in the left SFA/popliteal artery.  Also, the waveforms are monophasic to biphasic more proximally which is indicative of potential aortoiliac disease as well.   ?We had a long discussion today regarding the pathophysiology and natural history of peripheral arterial disease.  I discussed that I think his pain is multifactorial with a significant neuropathic component present as well as some degree of arterial insufficiency creating claudication as well.  I told him the pain at rest was most likely not due to arterial insufficiency.  I did discuss that with the findings, he does have peripheral arterial disease and consideration for intervention is reasonable with the understanding that his pain will not be resolved or cured but only improved, and particularly the pain with activity and difficulty walking.  I have also discussed that he is in a stable situation where appropriate medical therapy alone is adequate if he would like to continue that.  He prefers to have intervention which is reasonable.  I have discussed the risk and benefits of the procedure.  The patient voices his understanding and desires to proceed with staged bilateral lower extremity angiograms with possible intervention ?

## 2021-11-13 ENCOUNTER — Telehealth (INDEPENDENT_AMBULATORY_CARE_PROVIDER_SITE_OTHER): Payer: Self-pay

## 2021-11-13 NOTE — Telephone Encounter (Signed)
Spoke with the patient and he is scheduled with Dr. Lucky Cowboy for bilateral leg angios. LLE angio (11/20/21- 7:45 am), RLE angio ( 11/27/21- 6:45 am). Pre-procedure instructions were discussed and will be mailed. ?

## 2021-11-20 ENCOUNTER — Encounter
Admission: RE | Disposition: A | Discharge status: Home / Self Care | Payer: Self-pay | Source: Home / Self Care | Attending: Vascular Surgery

## 2021-11-20 ENCOUNTER — Other Ambulatory Visit: Payer: Self-pay

## 2021-11-20 ENCOUNTER — Encounter: Payer: Self-pay | Admitting: Vascular Surgery

## 2021-11-20 ENCOUNTER — Other Ambulatory Visit (INDEPENDENT_AMBULATORY_CARE_PROVIDER_SITE_OTHER): Payer: Self-pay | Admitting: Nurse Practitioner

## 2021-11-20 ENCOUNTER — Ambulatory Visit
Admission: RE | Admit: 2021-11-20 | Discharge: 2021-11-20 | Disposition: A | Discharge status: Home / Self Care | Payer: Medicare Other | Attending: Vascular Surgery | Admitting: Vascular Surgery

## 2021-11-20 DIAGNOSIS — E785 Hyperlipidemia, unspecified: Secondary | ICD-10-CM | POA: Insufficient documentation

## 2021-11-20 DIAGNOSIS — I2510 Atherosclerotic heart disease of native coronary artery without angina pectoris: Secondary | ICD-10-CM | POA: Insufficient documentation

## 2021-11-20 DIAGNOSIS — Z87891 Personal history of nicotine dependence: Secondary | ICD-10-CM | POA: Insufficient documentation

## 2021-11-20 DIAGNOSIS — Z79899 Other long term (current) drug therapy: Secondary | ICD-10-CM | POA: Diagnosis not present

## 2021-11-20 DIAGNOSIS — I428 Other cardiomyopathies: Secondary | ICD-10-CM | POA: Insufficient documentation

## 2021-11-20 DIAGNOSIS — E1151 Type 2 diabetes mellitus with diabetic peripheral angiopathy without gangrene: Secondary | ICD-10-CM | POA: Insufficient documentation

## 2021-11-20 DIAGNOSIS — Z7982 Long term (current) use of aspirin: Secondary | ICD-10-CM | POA: Insufficient documentation

## 2021-11-20 DIAGNOSIS — Z7985 Long-term (current) use of injectable non-insulin antidiabetic drugs: Secondary | ICD-10-CM | POA: Diagnosis not present

## 2021-11-20 DIAGNOSIS — I70213 Atherosclerosis of native arteries of extremities with intermittent claudication, bilateral legs: Secondary | ICD-10-CM | POA: Insufficient documentation

## 2021-11-20 DIAGNOSIS — I70219 Atherosclerosis of native arteries of extremities with intermittent claudication, unspecified extremity: Secondary | ICD-10-CM

## 2021-11-20 DIAGNOSIS — Z794 Long term (current) use of insulin: Secondary | ICD-10-CM | POA: Diagnosis not present

## 2021-11-20 DIAGNOSIS — I10 Essential (primary) hypertension: Secondary | ICD-10-CM | POA: Insufficient documentation

## 2021-11-20 DIAGNOSIS — I739 Peripheral vascular disease, unspecified: Secondary | ICD-10-CM

## 2021-11-20 DIAGNOSIS — G4730 Sleep apnea, unspecified: Secondary | ICD-10-CM | POA: Diagnosis not present

## 2021-11-20 DIAGNOSIS — I70212 Atherosclerosis of native arteries of extremities with intermittent claudication, left leg: Secondary | ICD-10-CM

## 2021-11-20 HISTORY — PX: LOWER EXTREMITY ANGIOGRAPHY: CATH118251

## 2021-11-20 LAB — GLUCOSE, CAPILLARY
Glucose-Capillary: 157 mg/dL — ABNORMAL HIGH (ref 70–99)
Glucose-Capillary: 156 mg/dL — ABNORMAL HIGH (ref 70–99)

## 2021-11-20 LAB — BASIC METABOLIC PANEL
Anion gap: 9 (ref 5–15)
BUN: 27 mg/dL — ABNORMAL HIGH (ref 8–23)
CO2: 27 mmol/L (ref 22–32)
Calcium: 9.6 mg/dL (ref 8.9–10.3)
Chloride: 98 mmol/L (ref 98–111)
Creatinine, Ser: 1.15 mg/dL (ref 0.61–1.24)
GFR, Estimated: >60 mL/min (ref >60)
Glucose, Bld: 168 mg/dL — ABNORMAL HIGH (ref 70–99)
Potassium: 3.4 mmol/L — ABNORMAL LOW (ref 3.5–5.1)
Sodium: 134 mmol/L — ABNORMAL LOW (ref 135–145)

## 2021-11-20 SURGERY — LOWER EXTREMITY ANGIOGRAPHY
Anesthesia: Moderate Sedation | Site: Leg Lower | Laterality: Left

## 2021-11-20 MED ORDER — ONDANSETRON HCL 4 MG/2ML IJ SOLN
4.0000 mg | Freq: Four times a day (QID) | INTRAMUSCULAR | Status: DC | PRN
Start: 2021-11-20 — End: 2021-11-20

## 2021-11-20 MED ORDER — SODIUM CHLORIDE 0.9% FLUSH: 3.0000 mL | Freq: Two times a day (BID) | INTRAVENOUS | Status: DC

## 2021-11-20 MED ORDER — FENTANYL CITRATE PF 50 MCG/ML IJ SOSY
PREFILLED_SYRINGE | INTRAMUSCULAR | Status: AC
  Filled 2021-11-20: qty 2

## 2021-11-20 MED ORDER — HYDRALAZINE HCL 20 MG/ML IJ SOLN: 5.0000 mg | INTRAMUSCULAR | Status: DC | PRN

## 2021-11-20 MED ORDER — CLOPIDOGREL BISULFATE 75 MG PO TABS
ORAL_TABLET | ORAL | Status: AC
  Filled 2021-11-20: qty 1

## 2021-11-20 MED ORDER — ACETAMINOPHEN 325 MG PO TABS: 650.0000 mg | ORAL_TABLET | ORAL | Status: DC | PRN

## 2021-11-20 MED ORDER — DIPHENHYDRAMINE HCL 50 MG/ML IJ SOLN
INTRAMUSCULAR | Status: DC | PRN
  Administered 2021-11-20: 50 mg via INTRAVENOUS

## 2021-11-20 MED ORDER — ONDANSETRON HCL 4 MG/2ML IJ SOLN: 4.0000 mg | Freq: Four times a day (QID) | INTRAMUSCULAR | Status: DC | PRN

## 2021-11-20 MED ORDER — SODIUM CHLORIDE 0.9 % IV SOLN: INTRAVENOUS | Status: DC

## 2021-11-20 MED ORDER — SODIUM CHLORIDE 0.9 % IV SOLN: 250.0000 mL | INTRAVENOUS | Status: DC | PRN

## 2021-11-20 MED ORDER — CLOPIDOGREL BISULFATE 75 MG PO TABS: 75.0000 mg | ORAL_TABLET | Freq: Every day | ORAL | 11 refills | Status: DC

## 2021-11-20 MED ORDER — LABETALOL HCL 5 MG/ML IV SOLN
10.0000 mg | INTRAVENOUS | Status: DC | PRN
  Administered 2021-11-20: 10 mg via INTRAVENOUS

## 2021-11-20 MED ORDER — SODIUM CHLORIDE 0.9% FLUSH: 3.0000 mL | INTRAVENOUS | Status: DC | PRN

## 2021-11-20 MED ORDER — HEPARIN SODIUM (PORCINE) 1000 UNIT/ML IJ SOLN
INTRAMUSCULAR | Status: AC
  Filled 2021-11-20: qty 10

## 2021-11-20 MED ORDER — MIDAZOLAM HCL 2 MG/ML PO SYRP: 8.0000 mg | ORAL_SOLUTION | Freq: Once | ORAL | Status: DC | PRN

## 2021-11-20 MED ORDER — MIDAZOLAM HCL 2 MG/2ML IJ SOLN
INTRAMUSCULAR | Status: DC | PRN
Start: 2021-11-20 — End: 2021-11-20
  Administered 2021-11-20: 2 mg via INTRAVENOUS

## 2021-11-20 MED ORDER — FAMOTIDINE 20 MG PO TABS: 40.0000 mg | ORAL_TABLET | Freq: Once | ORAL | Status: DC | PRN

## 2021-11-20 MED ORDER — MIDAZOLAM HCL 2 MG/2ML IJ SOLN
INTRAMUSCULAR | Status: AC
  Filled 2021-11-20: qty 4

## 2021-11-20 MED ORDER — IODIXANOL 320 MG/ML IV SOLN
INTRAVENOUS | Status: DC | PRN
Start: 2021-11-20 — End: 2021-11-20
  Administered 2021-11-20: 60 mL

## 2021-11-20 MED ORDER — FENTANYL CITRATE (PF) 100 MCG/2ML IJ SOLN
INTRAMUSCULAR | Status: DC | PRN
  Administered 2021-11-20: 50 ug via INTRAVENOUS

## 2021-11-20 MED ORDER — METHYLPREDNISOLONE SODIUM SUCC 125 MG IJ SOLR: 125.0000 mg | Freq: Once | INTRAMUSCULAR | Status: DC | PRN

## 2021-11-20 MED ORDER — CEFAZOLIN SODIUM-DEXTROSE 2-4 GM/100ML-% IV SOLN
INTRAVENOUS | Status: AC
  Administered 2021-11-20: 2 g via INTRAVENOUS
  Filled 2021-11-20: qty 100

## 2021-11-20 MED ORDER — CLOPIDOGREL BISULFATE 75 MG PO TABS
75.0000 mg | ORAL_TABLET | Freq: Once | ORAL | Status: AC
  Administered 2021-11-20: 75 mg via ORAL

## 2021-11-20 MED ORDER — LABETALOL HCL 5 MG/ML IV SOLN
INTRAVENOUS | Status: AC
  Administered 2021-11-20: 10 mg via INTRAVENOUS
  Filled 2021-11-20: qty 4

## 2021-11-20 MED ORDER — CLOPIDOGREL BISULFATE 75 MG PO TABS: 75.0000 mg | ORAL_TABLET | Freq: Every day | ORAL | Status: DC

## 2021-11-20 MED ORDER — DIPHENHYDRAMINE HCL 50 MG/ML IJ SOLN
INTRAMUSCULAR | Status: AC
  Filled 2021-11-20: qty 1

## 2021-11-20 MED ORDER — CEFAZOLIN SODIUM-DEXTROSE 2-4 GM/100ML-% IV SOLN: 2.0000 g | Freq: Once | INTRAVENOUS | Status: AC

## 2021-11-20 MED ORDER — HYDROMORPHONE HCL 1 MG/ML IJ SOLN: 1.0000 mg | Freq: Once | INTRAMUSCULAR | Status: DC | PRN

## 2021-11-20 MED ORDER — DIPHENHYDRAMINE HCL 50 MG/ML IJ SOLN: 50.0000 mg | Freq: Once | INTRAMUSCULAR | Status: DC | PRN

## 2021-11-20 SURGICAL SUPPLY — 14 items
CATH ANGIO 5F PIGTAIL 65CM (CATHETERS) ×1 IMPLANT
CATH TEMPO 5F RIM 65CM (CATHETERS) ×1 IMPLANT
COVER PROBE U/S 5X48 (MISCELLANEOUS) ×1 IMPLANT
DEVICE STARCLOSE SE CLOSURE (Vascular Products) ×2 IMPLANT
GLIDEWIRE ADV .035X180CM (WIRE) ×1 IMPLANT
GLIDEWIRE ADV .035X260CM (WIRE) ×1 IMPLANT
INTRODUCER 7FR 23CM (INTRODUCER) ×2 IMPLANT
KIT ENCORE 26 ADVANTAGE (KITS) ×2 IMPLANT
PACK ANGIOGRAPHY (CUSTOM PROCEDURE TRAY) ×2 IMPLANT
SHEATH BRITE TIP 5FRX11 (SHEATH) ×2 IMPLANT
STENT LIFESTREAM 8X58X80 (Permanent Stent) ×2 IMPLANT
SYR MEDRAD MARK 7 150ML (SYRINGE) ×1 IMPLANT
TUBING CONTRAST HIGH PRESS 72 (TUBING) ×1 IMPLANT
WIRE GUIDERIGHT .035X150 (WIRE) ×1 IMPLANT

## 2021-11-20 NOTE — OR Nursing (Signed)
Pt instructed to take all am meds when he gets home including bp meds ?

## 2021-11-20 NOTE — Interval H&P Note (Signed)
History and Physical Interval Note: ? ?11/20/2021 ?8:06 AM ? ?[name] [name].  has presented today for surgery, with the diagnosis of LLE Angio   BARD   ASO w claudication.  The various methods of treatment have been discussed with the patient and family. After consideration of risks, benefits and other options for treatment, the patient has consented to  Procedure(s): ?Lower Extremity Angiography (Left) as a surgical intervention.  The patient's history has been reviewed, patient examined, no change in status, stable for surgery.  I have reviewed the patient's chart and labs.  Questions were answered to the patient's satisfaction.   ? ? ?Leotis Pain ? ? ?

## 2021-11-20 NOTE — Op Note (Signed)
[facility] ? Percutaneous Study/Intervention Procedural Note ? ? ?Date of Surgery: 11/20/2021 ? ?Surgeon(s):DEW,JASON   ? ?Assistants:none ? ?Pre-operative Diagnosis: PAD with claudication BLE ? ?Post-operative diagnosis:  Same ? ?Procedure(s) Performed: ?            1.  Ultrasound guidance for vascular access bilateral femoral arteries ?            2.  Catheter placement into aorta from bilateral femoral approaches ?            3.  Aortogram and selective left lower extremity angiogram ?            4.  Kissing stent placements to bilateral common iliac arteries with 8 mm diameter by 58 mm length lifestream stents ?            5.  StarClose closure device bilateral femoral arteries ? ?EBL: 10 cc ? ?Contrast: 60 cc ? ?Fluoro Time: 4.6 minutes ? ?Moderate Conscious Sedation Time: approximately 48 minutes using 2 mg of Versed and 50 mcg of Fentanyl ?             ?Indications:  Patient is a 66 y.o.male with disabling claudication symptoms. The patient has noninvasive study showing reduced flow left worse than right. The patient is brought in for angiography for further evaluation and potential treatment. Risks and benefits are discussed and informed consent is obtained.  ? ?Procedure:  The patient was identified and appropriate procedural time out was performed.  The patient was then placed supine on the table and prepped and draped in the usual sterile fashion. Moderate conscious sedation was administered during a face to face encounter with the patient throughout the procedure with my supervision of the RN administering medicines and monitoring the patient's vital signs, pulse oximetry, telemetry and mental status throughout from the start of the procedure until the patient was taken to the recovery room. Ultrasound was used to evaluate the right common femoral artery.  It was patent .  A digital ultrasound image was acquired.  A Seldinger needle was used to access the right common femoral  artery under direct ultrasound guidance and a permanent image was performed.  A 0.035 J wire was advanced without resistance and a 5Fr sheath was placed.  Pigtail catheter was placed into the aorta and an AP aortogram was performed. The renal arteries appeared patent.  The aorta was patent.  Both common iliac arteries had greater than 60% stenosis worse on the left and the right being closer to 75 to 80% stenosis.  Both external iliac arteries were widely patent.  It was clear we would need to treat his inflow lesions first before considering any infrainguinal work on the left.  With this in mind, I accessed the left femoral artery under direct ultrasound guidance without difficulty in a retrograde fashion with a Seldinger needle.  A J-wire and 5 French sheath were placed.  Selective left lower extremity angiogram was then performed through the left femoral sheath. This demonstrated 70 to 80% stenosis of the left common femoral artery, the profunda femoris artery was somewhat small and diseased, the SFA had a short segment occlusion in the distal segment with normalization of the popliteal artery.  There appeared to be some disease in the tibioperoneal trunk with the posterior tibial artery being the dominant runoff, but with the inflow lesion, flow was sluggish and difficult to opacify.  The patient was then heparinized and we upsized to 7 [place] sheaths  bilaterally.  Advantage wires were placed in the aorta from bilateral femoral approaches after crossing the common iliac lesions.  We then deployed 8 mm diameter by 58 mm length lifestream stent simultaneously in both common iliac arteries above the hypogastric arteries and just distal to the aortic bifurcation.  These were inflated to 12 atm.  Completion imaging showed less than 10% residual stenosis in both common iliac arteries.  If he continues to have symptoms on the left, he will require a left common femoral endarterectomy and left SFA and possible tibial  intervention at a later date.  I elected to terminate the procedure. The sheath was removed and StarClose closure device was deployed in the left femoral artery with excellent hemostatic result.  Similarly, StarClose closure device was deployed in the right femoral artery with excellent hemostatic result.  The patient was taken to the recovery room in stable condition having tolerated the procedure well. ? ?Findings:   ?            Aortogram: Renal arteries appeared patent.  The aorta was patent.  Both common iliac arteries had greater than 60% stenosis worse on the left and the right being closer to 75 to 80% stenosis.  Both external iliac arteries were widely patent. ?            Left lower Extremity:  This demonstrated 70 to 80% stenosis of the left common femoral artery, the profunda femoris artery was somewhat small and diseased, the SFA had a short segment occlusion in the distal segment with normalization of the popliteal artery.  There appeared to be some disease in the tibioperoneal trunk with the posterior tibial artery being the dominant runoff, but with the inflow lesion, flow was sluggish and difficult to opacify. ? ? ?Disposition: Patient was taken to the recovery room in stable condition having tolerated the procedure well. ? ?Complications: None ? ?[name] [name] ?11/20/2021 ?10:23 AM ? ? ?This note was created with Dragon Medical transcription system. Any errors in dictation are purely unintentional.  ?

## 2021-11-21 ENCOUNTER — Encounter: Payer: Self-pay | Admitting: Vascular Surgery

## 2021-11-21 ENCOUNTER — Telehealth (INDEPENDENT_AMBULATORY_CARE_PROVIDER_SITE_OTHER): Payer: Self-pay | Admitting: Vascular Surgery

## 2021-11-21 NOTE — Telephone Encounter (Signed)
LVM for pt TCB and schedule post op appt. ? ?3 week post op + abi. See jd ?

## 2021-11-22 ENCOUNTER — Encounter: Payer: Medicare Other | Admitting: Pain Medicine

## 2021-11-27 ENCOUNTER — Ambulatory Visit: Admit: 2021-11-27 | Payer: Medicare Other | Admitting: Vascular Surgery

## 2021-11-27 DIAGNOSIS — I70219 Atherosclerosis of native arteries of extremities with intermittent claudication, unspecified extremity: Secondary | ICD-10-CM

## 2021-11-27 SURGERY — LOWER EXTREMITY ANGIOGRAPHY
Anesthesia: Moderate Sedation | Site: Leg Lower | Laterality: Right

## 2021-12-01 ENCOUNTER — Encounter (INDEPENDENT_AMBULATORY_CARE_PROVIDER_SITE_OTHER): Payer: Medicare Other | Admitting: Vascular Surgery

## 2021-12-01 ENCOUNTER — Encounter (INDEPENDENT_AMBULATORY_CARE_PROVIDER_SITE_OTHER): Payer: Medicare Other

## 2021-12-20 ENCOUNTER — Other Ambulatory Visit (INDEPENDENT_AMBULATORY_CARE_PROVIDER_SITE_OTHER): Payer: Self-pay | Admitting: Vascular Surgery

## 2021-12-20 DIAGNOSIS — I70213 Atherosclerosis of native arteries of extremities with intermittent claudication, bilateral legs: Secondary | ICD-10-CM

## 2021-12-20 DIAGNOSIS — Z95820 Peripheral vascular angioplasty status with implants and grafts: Secondary | ICD-10-CM

## 2021-12-22 ENCOUNTER — Ambulatory Visit (INDEPENDENT_AMBULATORY_CARE_PROVIDER_SITE_OTHER): Payer: Medicare Other

## 2021-12-22 ENCOUNTER — Ambulatory Visit (INDEPENDENT_AMBULATORY_CARE_PROVIDER_SITE_OTHER): Payer: Medicare Other | Admitting: Vascular Surgery

## 2021-12-22 ENCOUNTER — Encounter (INDEPENDENT_AMBULATORY_CARE_PROVIDER_SITE_OTHER): Payer: Self-pay | Admitting: Vascular Surgery

## 2021-12-22 VITALS — BP 135/86 | HR 70 | Resp 18 | Ht 63.0 in

## 2021-12-22 DIAGNOSIS — I70213 Atherosclerosis of native arteries of extremities with intermittent claudication, bilateral legs: Secondary | ICD-10-CM

## 2021-12-22 DIAGNOSIS — E1122 Type 2 diabetes mellitus with diabetic chronic kidney disease: Secondary | ICD-10-CM | POA: Diagnosis not present

## 2021-12-22 DIAGNOSIS — I10 Essential (primary) hypertension: Secondary | ICD-10-CM

## 2021-12-22 DIAGNOSIS — E785 Hyperlipidemia, unspecified: Secondary | ICD-10-CM

## 2021-12-22 DIAGNOSIS — Z794 Long term (current) use of insulin: Secondary | ICD-10-CM

## 2021-12-22 DIAGNOSIS — Z95820 Peripheral vascular angioplasty status with implants and grafts: Secondary | ICD-10-CM

## 2021-12-22 DIAGNOSIS — N1832 Chronic kidney disease, stage 3b: Secondary | ICD-10-CM

## 2021-12-22 NOTE — Assessment & Plan Note (Signed)
His ABIs today are 1.14 on the right and 1.10 on the left with multiphasic waveforms and good digital pressures bilaterally.  He does have known significant disease residual in the left leg, but his perfusion is currently reasonably well-maintained and he is not having significant symptoms.  As such, I would not recommend open surgical therapy or hybrid surgical and endovascular therapy in his case due to his small amount of symptoms and relatively well-maintained perfusion.  He will continue his current medical regimen.  A short interval follow-up of about 3 months will be planned. ?

## 2021-12-22 NOTE — Assessment & Plan Note (Signed)
blood pressure control important in reducing the progression of atherosclerotic disease. On appropriate oral medications.  

## 2021-12-22 NOTE — Assessment & Plan Note (Signed)
lipid control important in reducing the progression of atherosclerotic disease. Continue statin therapy  

## 2021-12-22 NOTE — Assessment & Plan Note (Signed)
blood glucose control important in reducing the progression of atherosclerotic disease. Also, involved in wound healing. On appropriate medications.  

## 2021-12-22 NOTE — Progress Notes (Signed)
? ? ?MRN : [id number] ? ?[name] [name]. is a 66 y.o. ([birth year]) male who presents with chief complaint of No chief complaint on file. ?. ? ?History of Present Illness: Patient returns today in follow up of his peripheral arterial disease.  He underwent aortoiliac intervention several weeks ago for claudication symptoms.  His legs are doing pretty well and he says he really is not having much in the way of pain.  He does have some left leg pain with activity but it is not limiting him or bothering him at this point.  He had known left common femoral, femoral bifurcation, and infrainguinal disease that would have to be addressed with hybrid surgical and endovascular therapy if he remains symptomatic after his aortoiliac intervention.  He had no periprocedural complications.  His bruising resolved after a couple of weeks.  His ABIs today are 1.14 on the right and 1.10 on the left with multiphasic waveforms and good digital pressures bilaterally. ? ?Current Outpatient Medications  ?Medication Sig Dispense Refill  ? allopurinol (ZYLOPRIM) 100 MG tablet Take 100 mg by mouth daily.     ? aspirin EC 81 MG tablet Take 81 mg by mouth daily. Swallow whole.    ? atorvastatin (LIPITOR) 40 MG tablet Take 40 mg by mouth at bedtime.     ? B-D UF III MINI PEN NEEDLES 31G X 5 MM MISC 50 mm 2 (two) times daily. 4 mm needles    ? BD PEN NEEDLE NANO 2ND GEN 32G X 4 MM MISC Inject 1 applicator into the skin in the morning and at bedtime.    ? Cholecalciferol 25 MCG (1000 UT) tablet Take 2,000 Units by mouth daily.    ? clopidogrel (PLAVIX) 75 MG tablet Take 1 tablet (75 mg total) by mouth daily. 30 tablet 11  ? hydrochlorothiazide (HYDRODIURIL) 25 MG tablet Take 25 mg by mouth daily.    ? HYDROcodone-acetaminophen (NORCO/VICODIN) 5-325 MG tablet Take 1 tablet by mouth 2 (two) times daily as needed for severe pain. Must last 30 days 60 tablet 0  ? [START ON 01/25/2022] HYDROcodone-acetaminophen (NORCO/VICODIN) 5-325 MG tablet Take 1  tablet by mouth 2 (two) times daily as needed for severe pain. Must last 30 days 60 tablet 0  ? insulin detemir (LEVEMIR) 100 UNIT/ML FlexPen Inject 30 Units into the skin at bedtime. (Patient taking differently: Inject 50 Units into the skin 2 (two) times daily. 50 units in the morning 53 units at bedtime) 15 mL   ? isosorbide mononitrate (IMDUR) 30 MG 24 hr tablet Take 30 mg by mouth every morning.   1  ? losartan (COZAAR) 100 MG tablet Take 100 mg by mouth at bedtime.    ? metoprolol tartrate (LOPRESSOR) 50 MG tablet Take 1 tablet (50 mg total) by mouth 2 (two) times daily. (Patient taking differently: Take 50 mg by mouth 3 (three) times daily.)    ? Multiple Vitamin (MULTIVITAMIN WITH MINERALS) TABS tablet Take 1 tablet by mouth every evening.    ? nitroGLYCERIN (NITROSTAT) 0.4 MG SL tablet Place 0.4 mg under the tongue every 5 (five) minutes x 3 doses as needed for chest pain.     ? OZEMPIC, 0.25 OR 0.5 MG/DOSE, 2 MG/1.5ML SOPN Inject 1 mg into the skin once a week.    ? Potassium 99 MG TABS Take 1 tablet by mouth daily.    ? gabapentin (NEURONTIN) 600 MG tablet Take 1 tablet (600 mg total) by mouth 2 (two) times daily. [place]  tablet 2  ? [START ON 12/26/2021] HYDROcodone-acetaminophen (NORCO/VICODIN) 5-325 MG tablet Take 1 tablet by mouth 2 (two) times daily as needed for severe pain. Must last 30 days (Patient not taking: Reported on 12/22/2021) 60 tablet 0  ? pantoprazole (PROTONIX) 40 MG tablet Take 1 tablet (40 mg total) by mouth daily. 30 tablet 0  ? ?No current facility-administered medications for this visit.  ? ? ?Past Medical History:  ?Diagnosis Date  ? Benign esophageal stricture   ? Cardiomyopathy, secondary ([place])   ? Coronary artery disease   ? Diabetes mellitus without complication ([place])   ? Diverticulitis   ? GI bleed   ? Gout   ? Hemorrhoids   ? Hyperlipidemia   ? Hypertension   ? Sleep apnea   ? Tubular adenoma of colon   ? ? ?Past Surgical History:  ?Procedure Laterality Date  ? cardiac bypass    ?  CARDIAC CATHETERIZATION Left 02/03/2016  ? Procedure: Left Heart Cath and Coronary Angiography;  Surgeon: Teodoro Spray, MD;  Location: [place] [facility];  Service: Cardiovascular;  Laterality: Left;  ? CARDIAC SURGERY    ? COLONOSCOPY WITH PROPOFOL N/A 04/29/2015  ? Procedure: COLONOSCOPY WITH PROPOFOL;  Surgeon: Hulen Luster, MD;  Location: [facility] ENDOSCOPY;  Service: Gastroenterology;  Laterality: N/A;  ? COLONOSCOPY WITH PROPOFOL N/A 07/20/2020  ? Procedure: COLONOSCOPY WITH PROPOFOL;  Surgeon: [place], Benay Pike, MD;  Location: [facility] ENDOSCOPY;  Service: Gastroenterology;  Laterality: N/A;  ? CORONARY ANGIOPLASTY WITH STENT PLACEMENT    ? x2  ? CORONARY ARTERY BYPASS GRAFT    ? 2013 & 2017  ? ESOPHAGOGASTRODUODENOSCOPY (EGD) WITH PROPOFOL N/A 04/29/2015  ? Procedure: ESOPHAGOGASTRODUODENOSCOPY (EGD) WITH PROPOFOL;  Surgeon: Hulen Luster, MD;  Location: [facility] ENDOSCOPY;  Service: Gastroenterology;  Laterality: N/A;  ? LOWER EXTREMITY ANGIOGRAPHY Left 11/20/2021  ? Procedure: Lower Extremity Angiography;  Surgeon: Algernon Huxley, MD;  Location: [place] [facility];  Service: Cardiovascular;  Laterality: Left;  ? SEPTOPLASTY    ? ? ? ?Social History  ? ?Tobacco Use  ? Smoking status: Former  ?  Years: 25.00  ?  Types: Cigarettes  ? Smokeless tobacco: Former  ?Substance Use Topics  ? Alcohol use: Yes  ?  Alcohol/week: 14.0 standard drinks  ?  Types: 14 Shots of liquor per week  ?  Comment: couple drinks daily  ? Drug use: No  ? ?  ? ? ?Family History  ?Problem Relation Age of Onset  ? Heart disease Mother   ? Hypertension Father   ? ? ? ?No Known Allergies ? ? ?REVIEW OF SYSTEMS (Negative unless checked) ? ?Constitutional: '[]'$ Weight loss  '[]'$ Fever  '[]'$ Chills ?Cardiac: '[]'$ Chest pain   '[]'$ Chest pressure   '[]'$ Palpitations   '[]'$ Shortness of breath when laying flat   '[]'$ Shortness of breath at rest   '[]'$ Shortness of breath with exertion. ?Vascular:  '[x]'$ Pain in legs with walking   '[x]'$ Pain in legs at rest   '[]'$ Pain in legs when laying flat    '[]'$ Claudication   '[]'$ Pain in feet when walking  '[]'$ Pain in feet at rest  '[]'$ Pain in feet when laying flat   '[]'$ History of DVT   '[]'$ Phlebitis   '[]'$ Swelling in legs   '[]'$ Varicose veins   '[]'$ Non-healing ulcers ?Pulmonary:   '[]'$ Uses home oxygen   '[]'$ Productive cough   '[]'$ Hemoptysis   '[]'$ Wheeze  '[]'$ COPD   '[]'$ Asthma ?Neurologic:  '[]'$ Dizziness  '[]'$ Blackouts   '[]'$ Seizures   '[]'$ History of stroke   '[]'$ History of TIA  '[]'$   Aphasia   '[]'$ Temporary blindness   '[]'$ Dysphagia   '[]'$ Weakness or numbness in arms   '[]'$ Weakness or numbness in legs ?Musculoskeletal:  '[x]'$ Arthritis   '[]'$ Joint swelling   '[]'$ Joint pain   '[]'$ Low back pain ?Hematologic:  '[]'$ Easy bruising  '[]'$ Easy bleeding   '[]'$ Hypercoagulable state   '[]'$ Anemic   ?Gastrointestinal:  '[]'$ Blood in stool   '[]'$ Vomiting blood  '[]'$ Gastroesophageal reflux/heartburn   '[]'$ Abdominal pain ?Genitourinary:  '[]'$ Chronic kidney disease   '[]'$ Difficult urination  '[]'$ Frequent urination  '[]'$ Burning with urination   '[]'$ Hematuria ?Skin:  '[]'$ Rashes   '[]'$ Ulcers   '[]'$ Wounds ?Psychological:  '[]'$ History of anxiety   '[]'$  History of major depression. ? ?Physical Examination ? ?BP 135/86 (BP Location: Left Arm)   Pulse 70   Resp 18   Ht '5\' 3"'$  (1.6 m)   BMI 28.34 kg/m?  ?Gen:  WD/WN, NAD ?Head: [place]/AT, No temporalis wasting. ?Ear/Nose/Throat: Hearing grossly intact, nares w/o erythema or drainage ?Eyes: Conjunctiva clear. Sclera non-icteric ?Neck: Supple.  Trachea midline ?Pulmonary:  Good air movement, no use of accessory muscles.  ?Cardiac: RRR, no JVD ?Vascular:  ?Vessel Right Left  ?Radial Palpable Palpable  ?    ?    ?    ?    ?    ?    ?PT 2+ palpable 1+ palpable  ?DP 2+ palpable 1+ palpable  ? ?Gastrointestinal: soft, non-tender/non-distended. No guarding/reflex.  ?Musculoskeletal: M/S 5/5 throughout.  No deformity or atrophy.  No significant lower extremity edema. ?Neurologic: Sensation grossly intact in extremities.  Symmetrical.  Speech is fluent.  ?Psychiatric: Judgment intact, Mood & affect appropriate for pt's clinical  situation. ?Dermatologic: No rashes or ulcers noted.  No cellulitis or open wounds. ? ? ? ? ? ?Labs ?Recent Results (from the past 2160 hour(s))  ?Glucose, capillary     Status: Abnormal  ? Collection Time: 11/20/21  8:24 AM  ?Res

## 2022-02-12 ENCOUNTER — Encounter: Payer: Medicare Other | Admitting: Pain Medicine

## 2022-02-19 NOTE — Progress Notes (Unsigned)
PROVIDER NOTE: Information contained herein reflects review and annotations entered in association with encounter. Interpretation of such information and data should be left to medically-trained personnel. Information provided to patient can be located elsewhere in the medical record under "Patient Instructions". Document created using STT-dictation technology, any transcriptional errors that may result from process are unintentional.    Patient: [name] [name].  Service Category: E/M  Provider: Gaspar Cola, MD  DOB: 1956-05-16  DOS: 02/21/2022  Specialty: Interventional Pain Management  MRN: [id number]  Setting: Ambulatory outpatient  PCP: Ranae Plumber, PA  Type: Established Patient    Referring Provider: Ranae Plumber, PA  Location: Office  Delivery: Face-to-face     HPI  [name], a 67 y.o. year old male, is here today because of his No primary diagnosis found.. [name] primary complain today is No chief complaint on file. Last encounter: My last encounter with him was on 11/08/2021. Pertinent problems: [name] has Chronic shoulder pain (3ry area of Pain) (Right); Chronic hip pain (Left); Chronic chest wall pain (1ry area of Pain) (Incisional Midline) (since 04/22/2012); Chronic lower extremity pain (2ry area of Pain) (Bilateral) (R>L); Chronic sacroiliac joint pain (Bilateral) (L>R); Neuropathic pain; Neurogenic pain; Incisional pain; Lumbar facet syndrome (Bilateral) (R>L); Lumbar spondylosis; Keloid skin disorder; Costochondritis; Brachial plexus injury, right; Right sided weakness; Chronic pain syndrome; DDD (degenerative disc disease), lumbosacral; Lumbar facet arthropathy (Multilevel) (Bilateral); 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 Pain Assessment: Severity of   is reported as a  /10. Location:    / . Onset:  . Quality:  . Timing:  . Modifying factor(s):  Marland Kitchen Vitals:  vitals were not taken for this visit.   Reason for encounter:  *** . ***  Pharmacotherapy Assessment  Analgesic: Hydrocodone/APAP 5/325 mg, 1 tab PO BID (10 mg/day of hydrocodone) MME/day: 10 mg/day.   Monitoring: [place] PMP: PDMP reviewed during this encounter.       Pharmacotherapy: No side-effects or adverse reactions reported. Compliance: No problems identified. Effectiveness: Clinically acceptable.  No notes on file  UDS:  Summary  Date Value Ref Range Status  05/22/2021 Note  Final    Comment:    ==================================================================== [employer] [facility] (MW) ==================================================================== Test                             Result       Flag       Units  Drug Present and Declared for Prescription Verification   Hydrocodone                    1462         EXPECTED   ng/mg creat   Hydromorphone                  360          EXPECTED   ng/mg creat   Dihydrocodeine                 61           EXPECTED   ng/mg creat   Norhydrocodone                 671  EXPECTED   ng/mg creat    Sources of hydrocodone include scheduled prescription medications.    Hydromorphone, dihydrocodeine and norhydrocodone are expected    metabolites of hydrocodone. Hydromorphone and dihydrocodeine are    also available as scheduled prescription medications.  ==================================================================== Test                      Result    Flag   Units      Ref Range   Creatinine              185              mg/dL      >=20 ==================================================================== Declared Medications:  The flagging and interpretation on this report are based on the  following declared medications.  Unexpected results may arise from   inaccuracies in the declared medications.   **Note: The testing scope of this panel includes these medications:   Hydrocodone (Norco)   **Note: The testing scope of this panel does not include the  following reported medications:   Acetaminophen (Norco)  Allopurinol (Zyloprim)  Aspirin  Atorvastatin (Lipitor)  Cholecalciferol  Gabapentin (Neurontin)  Hydrochlorothiazide (Hydrodiuril)  Insulin (Levemir)  Isosorbide (Imdur)  Linagliptin (Tradjenta)  Losartan (Cozaar)  Metoprolol (Lopressor)  Multivitamin  Nitroglycerin (Nitrostat)  Pantoprazole (Protonix)  Potassium  Semaglutide (Ozempic) ==================================================================== For clinical consultation, please call [phone]. ====================================================================      ROS  Constitutional: Denies any fever or chills Gastrointestinal: No reported hemesis, hematochezia, vomiting, or acute GI distress Musculoskeletal: Denies any acute onset joint swelling, redness, loss of ROM, or weakness Neurological: No reported episodes of acute onset apraxia, aphasia, dysarthria, agnosia, amnesia, paralysis, loss of coordination, or loss of consciousness  Medication Review  Cholecalciferol, HYDROcodone-acetaminophen, Insulin Pen Needle, Potassium, Semaglutide(0.25 or 0.5MG/DOS), allopurinol, aspirin EC, atorvastatin, clopidogrel, gabapentin, hydrochlorothiazide, insulin detemir, isosorbide mononitrate, losartan, metoprolol tartrate, multivitamin with minerals, nitroGLYCERIN, and pantoprazole  History Review  Allergy: [name] has No Known Allergies. Drug: [name]  reports no history of drug use. Alcohol:  reports current alcohol use of about 14.0 standard drinks of alcohol per week. Tobacco:  reports that he has quit smoking. His smoking use included cigarettes. He has quit using smokeless tobacco. Social: [name]  reports that he has quit smoking. His smoking  use included cigarettes. He has quit using smokeless tobacco. He reports current alcohol use of about 14.0 standard drinks of alcohol per week. He reports that he does not use drugs. Medical:  has a past medical history of Benign esophageal stricture, Cardiomyopathy, secondary ([place]), Coronary artery disease, Diabetes mellitus without complication ([place]), Diverticulitis, GI bleed, Gout, Hemorrhoids, Hyperlipidemia, Hypertension, Sleep apnea, and Tubular adenoma of colon. Surgical: [name]  has a past surgical history that includes Coronary angioplasty with stent; cardiac bypass; Cardiac surgery; Colonoscopy with propofol (N/A, 04/29/2015); Esophagogastroduodenoscopy (egd) with propofol (N/A, 04/29/2015); Septoplasty; Cardiac catheterization (Left, 02/03/2016); Coronary artery bypass graft; Colonoscopy with propofol (N/A, 07/20/2020); and Lower Extremity Angiography (Left, 11/20/2021). Family: family history includes Heart disease in his mother; Hypertension in his father.  Laboratory Chemistry Profile   Renal Lab Results  Component Value Date   BUN 27 (H) 11/20/2021   CREATININE 1.15 11/20/2021   GFRAA >60 05/07/2019   GFRNONAA >60 11/20/2021    Hepatic Lab Results  Component Value Date   AST 76 (H) 07/16/2021   ALT 71 (H) 07/16/2021   ALBUMIN 4.4 07/16/2021   ALKPHOS 75 07/16/2021   HCVAB NON REACTIVE 10/09/2020  LIPASE 39 07/16/2021    Electrolytes Lab Results  Component Value Date   NA 134 (L) 11/20/2021   K 3.4 (L) 11/20/2021   CL 98 11/20/2021   CALCIUM 9.6 11/20/2021   MG 2.2 10/09/2020   PHOS 3.7 10/09/2020    Bone Lab Results  Component Value Date   VD25OH 74.7 05/07/2019   VD125OH2TOT 30.8 10/26/2015    Inflammation (CRP: Acute Phase) (ESR: Chronic Phase) Lab Results  Component Value Date   CRP 0.9 05/07/2019   ESRSEDRATE 41 (H) 05/07/2019   LATICACIDVEN 1.9 10/09/2020         Note: Above Lab results reviewed.  Recent Imaging Review  VAS [place] ABI WITH/WO TBI   LOWER EXTREMITY DOPPLER STUDY  Patient Name:  [name] [name]  Date of Exam:   12/22/2021 Medical Rec #: [id number]       Accession #:    [id number] Date of Birth: 1955-12-08        Patient Gender: M Patient Age:   40 years Exam Location:  [facility] Procedure:      VAS [place] ABI WITH/WO TBI Referring Phys:  --------------------------------------------------------------------------------   Indications: Peripheral artery disease.   Vascular Interventions: 11/20/21: Bilateral CIA stents;.  Performing Technologist: Concha Norway RVT    Examination Guidelines: A complete evaluation includes at minimum, Doppler waveform signals and systolic blood pressure reading at the level of bilateral brachial, anterior tibial, and posterior tibial arteries, when vessel segments are accessible. Bilateral testing is considered an integral part of a complete examination. Photoelectric Plethysmograph (PPG) waveforms and toe systolic pressure readings are included as required and additional duplex testing as needed. Limited examinations for reoccurring indications may be performed as noted.    ABI Findings: +---------+------------------+-----+---------+--------+ Right    Rt Pressure (mmHg)IndexWaveform Comment  +---------+------------------+-----+---------+--------+ Brachial 151                                      +---------+------------------+-----+---------+--------+ ATA      158               1.05 triphasic         +---------+------------------+-----+---------+--------+ PTA      172               1.14 triphasic         +---------+------------------+-----+---------+--------+ Great Toe160               1.06 Normal            +---------+------------------+-----+---------+--------+  +---------+------------------+-----+---------+-------+ Left     Lt Pressure (mmHg)IndexWaveform Comment +---------+------------------+-----+---------+-------+ Brachial 150                                      +---------+------------------+-----+---------+-------+ ATA      163               1.08 triphasic        +---------+------------------+-----+---------+-------+ PTA      166               1.10 triphasic        +---------+------------------+-----+---------+-------+ Great Toe166               1.10 Normal           +---------+------------------+-----+---------+-------+  +-------+-----------+-----------+------------+------------+ ABI/TBIToday's ABIToday's TBIPrevious ABIPrevious TBI +-------+-----------+-----------+------------+------------+ Right  1.14  1.06       1.07        0.70         +-------+-----------+-----------+------------+------------+ Left   1.10       1.10       0.63        0.44         +-------+-----------+-----------+------------+------------+  Left ABIs appear increased compared to prior study on 11/13/21. Right ABIs appear essentially unchanged.   Summary: Right: Resting right ankle-brachial index is within normal range. No evidence of significant right lower extremity arterial disease. The right toe-brachial index is normal.  Left: Resting left ankle-brachial index is within normal range. No evidence of significant left lower extremity arterial disease. The left toe-brachial index is normal.  *See table(s) above for measurements and observations.    Electronically signed by Leotis Pain MD on 12/25/2021 at 3:19:34 PM.      Final   Note: Reviewed        Physical Exam  General appearance: Well nourished, well developed, and well hydrated. In no apparent acute distress Mental status: Alert, oriented x 3 (person, place, & time)       Respiratory: No evidence of acute respiratory distress Eyes: PERLA Vitals: There were no vitals taken for this visit. BMI: Estimated body mass index is 28.34 kg/m as calculated from the following:   Height as of 12/22/21: 5' 3" (1.6 m).   Weight as of 11/20/21:  160 lb (72.6 kg). Ideal: Patient weight not recorded  Assessment   Diagnosis Status  1. Chronic pain syndrome   2. Chronic chest wall pain (1ry area of Pain) (Incisional Midline) (since 04/22/2012)   3. Chronic lower extremity pain (2ry area of Pain) (Bilateral) (R>L)   4. Chronic shoulder pain (3ry area of Pain) (Right)   5. Chronic low back pain (Bilateral) (R>L) w/o sciatica   6. Lumbar facet syndrome (Bilateral) (R>L)   7. Pharmacologic therapy   8. Chronic use of opiate for therapeutic purpose   9. Encounter for medication management   10. Encounter for chronic pain management    Controlled Controlled Controlled   Updated Problems: No problems updated.  Plan of Care  Problem-specific:  No problem-specific Assessment & Plan notes found for this encounter.  [name] has a current medication list which includes the following long-term medication(s): allopurinol, atorvastatin, gabapentin, hydrocodone-acetaminophen, insulin detemir, isosorbide mononitrate, metoprolol tartrate, and pantoprazole.  Pharmacotherapy (Medications Ordered): No orders of the defined types were placed in this encounter.  Orders:  No orders of the defined types were placed in this encounter.  Follow-up plan:   No follow-ups on file.     Interventional management options: Planned, scheduled, and/or pending:   None at this time.   Considering:   Therapeutic bilateral Lumbar facet + bilateral sacroiliac joint RFA #1    Palliative PRN treatment(s):   Palliative left intra-articular hip joint injection #2  Palliative bilateral lumbar facet block #3  Palliative bilateral sacroiliac joint block #2      Recent Visits No visits were found meeting these conditions. Showing recent visits within past 90 days and meeting all other requirements Future Appointments Date Type Provider Dept  02/21/22 Appointment Milinda Pointer, MD [facility]  Showing future appointments  within next 90 days and meeting all other requirements  I discussed the assessment and treatment plan with the patient. The patient was provided an opportunity to ask questions and all were answered. The patient agreed with the plan and  demonstrated an understanding of the instructions.  Patient advised to call back or seek an in-person evaluation if the symptoms or condition worsens.  Duration of encounter: *** minutes.  Note by: Gaspar Cola, MD Date: 02/21/2022; Time: 1:48 PM

## 2022-02-21 ENCOUNTER — Encounter: Payer: Self-pay | Admitting: Pain Medicine

## 2022-02-21 ENCOUNTER — Ambulatory Visit: Payer: Medicare Other | Attending: Pain Medicine | Admitting: Pain Medicine

## 2022-02-21 VITALS — BP 97/61 | HR 64 | Temp 97.3°F | Resp 16 | Ht 63.0 in | Wt 160.0 lb

## 2022-02-21 DIAGNOSIS — G8929 Other chronic pain: Secondary | ICD-10-CM | POA: Diagnosis present

## 2022-02-21 DIAGNOSIS — M25511 Pain in right shoulder: Secondary | ICD-10-CM | POA: Diagnosis present

## 2022-02-21 DIAGNOSIS — M545 Low back pain, unspecified: Secondary | ICD-10-CM

## 2022-02-21 DIAGNOSIS — R0789 Other chest pain: Secondary | ICD-10-CM

## 2022-02-21 DIAGNOSIS — G894 Chronic pain syndrome: Secondary | ICD-10-CM

## 2022-02-21 DIAGNOSIS — M79605 Pain in left leg: Secondary | ICD-10-CM

## 2022-02-21 DIAGNOSIS — Z79899 Other long term (current) drug therapy: Secondary | ICD-10-CM

## 2022-02-21 DIAGNOSIS — M79604 Pain in right leg: Secondary | ICD-10-CM

## 2022-02-21 DIAGNOSIS — M47816 Spondylosis without myelopathy or radiculopathy, lumbar region: Secondary | ICD-10-CM

## 2022-02-21 DIAGNOSIS — Z79891 Long term (current) use of opiate analgesic: Secondary | ICD-10-CM

## 2022-02-21 DIAGNOSIS — I70213 Atherosclerosis of native arteries of extremities with intermittent claudication, bilateral legs: Secondary | ICD-10-CM | POA: Diagnosis not present

## 2022-02-21 MED ORDER — HYDROCODONE-ACETAMINOPHEN 5-325 MG PO TABS: 1.0000 | ORAL_TABLET | Freq: Two times a day (BID) | ORAL | 0 refills | Status: DC | PRN

## 2022-02-21 NOTE — Progress Notes (Signed)
Nursing Pain Medication Assessment:  Safety precautions to be maintained throughout the outpatient stay will include: orient to surroundings, keep bed in low position, maintain call bell within reach at all times, provide assistance with transfer out of bed and ambulation.  Medication Inspection Compliance: Pill count conducted under aseptic conditions, in front of the patient. Neither the pills nor the bottle was removed from the patient's sight at any time. Once count was completed pills were immediately returned to the patient in their original bottle.  Medication: Hydrocodone/APAP Pill/Patch Count:  7 of 60 pills remain Pill/Patch Appearance: Markings consistent with prescribed medication Bottle Appearance: Standard pharmacy container. Clearly labeled. Filled Date: 05 / 25 / 2023 Last Medication intake:  Today

## 2022-02-21 NOTE — Patient Instructions (Signed)
____________________________________________________________________________________________  Medication Rules  Purpose: To inform patients, and their family members, of our rules and regulations.  Applies to: All patients receiving prescriptions (written or electronic).  Pharmacy of record: Pharmacy where electronic prescriptions will be sent. If written prescriptions are taken to a different pharmacy, please inform the nursing staff. The pharmacy listed in the electronic medical record should be the one where you would like electronic prescriptions to be sent.  Electronic prescriptions: In compliance with the [place] Strengthen Opioid Misuse Prevention (STOP) Act of 2017 (Session Law 2017-74/H243), effective September 03, 2018, all controlled substances must be electronically prescribed. Calling prescriptions to the pharmacy will cease to exist.  Prescription refills: Only during scheduled appointments. Applies to all prescriptions.  NOTE: The following applies primarily to controlled substances (Opioid* Pain Medications).   Type of encounter (visit): For patients receiving controlled substances, face-to-face visits are required. (Not an option or up to the patient.)  Patient's responsibilities: Pain Pills: Bring all pain pills to every appointment (except for procedure appointments). Pill Bottles: Bring pills in original pharmacy bottle. Always bring the newest bottle. Bring bottle, even if empty. Medication refills: You are responsible for knowing and keeping track of what medications you take and those you need refilled. The day before your appointment: write a list of all prescriptions that need to be refilled. The day of the appointment: give the list to the admitting nurse. Prescriptions will be written only during appointments. No prescriptions will be written on procedure days. If you forget a medication: it will not be "Called in", "Faxed", or "electronically sent". You will  need to get another appointment to get these prescribed. No early refills. Do not call asking to have your prescription filled early. Prescription Accuracy: You are responsible for carefully inspecting your prescriptions before leaving our office. Have the discharge nurse carefully go over each prescription with you, before taking them home. Make sure that your name is accurately spelled, that your address is correct. Check the name and dose of your medication to make sure it is accurate. Check the number of pills, and the written instructions to make sure they are clear and accurate. Make sure that you are given enough medication to last until your next medication refill appointment. Taking Medication: Take medication as prescribed. When it comes to controlled substances, taking less pills or less frequently than prescribed is permitted and encouraged. Never take more pills than instructed. Never take medication more frequently than prescribed.  Inform other Doctors: Always inform, all of your healthcare providers, of all the medications you take. Pain Medication from other Providers: You are not allowed to accept any additional pain medication from any other Doctor or Healthcare provider. There are two exceptions to this rule. (see below) In the event that you require additional pain medication, you are responsible for notifying us, as stated below. Cough Medicine: Often these contain an opioid, such as codeine or hydrocodone. Never accept or take cough medicine containing these opioids if you are already taking an opioid* medication. The combination may cause respiratory failure and death. Medication Agreement: You are responsible for carefully reading and following our Medication Agreement. This must be signed before receiving any prescriptions from our practice. Safely store a copy of your signed Agreement. Violations to the Agreement will result in no further prescriptions. (Additional copies of our  Medication Agreement are available upon request.) Laws, Rules, & Regulations: All patients are expected to follow all Federal and State Laws, Statutes, Rules, & Regulations. Ignorance of   the Laws does not constitute a valid excuse.  Illegal drugs and Controlled Substances: The use of illegal substances (including, but not limited to marijuana and its derivatives) and/or the illegal use of any controlled substances is strictly prohibited. Violation of this rule may result in the immediate and permanent discontinuation of any and all prescriptions being written by our practice. The use of any illegal substances is prohibited. Adopted CDC guidelines & recommendations: Target dosing levels will be at or below 60 MME/day. Use of benzodiazepines** is not recommended.  Exceptions: There are only two exceptions to the rule of not receiving pain medications from other Healthcare Providers. Exception #1 (Emergencies): In the event of an emergency (i.e.: accident requiring emergency care), you are allowed to receive additional pain medication. However, you are responsible for: As soon as you are able, call our office [phone], at any time of the day or night, and leave a message stating your name, the date and nature of the emergency, and the name and dose of the medication prescribed. In the event that your call is answered by a member of our staff, make sure to document and save the date, time, and the name of the person that took your information.  Exception #2 (Planned Surgery): In the event that you are scheduled by another doctor or dentist to have any type of surgery or procedure, you are allowed (for a period no longer than 30 days), to receive additional pain medication, for the acute post-op pain. However, in this case, you are responsible for picking up a copy of our "Post-op Pain Management for Surgeons" handout, and giving it to your surgeon or dentist. This document is available at our office, and  does not require an appointment to obtain it. Simply go to our office during business hours (Monday-Thursday from 8:00 AM to 4:00 PM) (Friday 8:00 AM to 12:00 Noon) or if you have a scheduled appointment with us, prior to your surgery, and ask for it by name. In addition, you are responsible for: calling our office [phone], at any time of the day or night, and leaving a message stating your name, name of your surgeon, type of surgery, and date of procedure or surgery. Failure to comply with your responsibilities may result in termination of therapy involving the controlled substances. Medication Agreement Violation. Following the above rules, including your responsibilities will help you in avoiding a Medication Agreement Violation ("Breaking your Pain Medication Contract").  *Opioid medications include: morphine, codeine, oxycodone, oxymorphone, hydrocodone, hydromorphone, meperidine, tramadol, tapentadol, buprenorphine, fentanyl, methadone. **Benzodiazepine medications include: diazepam (Valium), alprazolam (Xanax), clonazepam (Klonopine), lorazepam (Ativan), clorazepate (Tranxene), chlordiazepoxide (Librium), estazolam (Prosom), oxazepam (Serax), temazepam (Restoril), triazolam (Halcion) (Last updated: 05/31/2021) ____________________________________________________________________________________________  ____________________________________________________________________________________________  Medication Recommendations and Reminders  Applies to: All patients receiving prescriptions (written and/or electronic).  Medication Rules & Regulations: These rules and regulations exist for your safety and that of others. They are not flexible and neither are we. Dismissing or ignoring them will be considered "non-compliance" with medication therapy, resulting in complete and irreversible termination of such therapy. (See document titled "Medication Rules" for more details.) In all conscience,  because of safety reasons, we cannot continue providing a therapy where the patient does not follow instructions.  Pharmacy of record:  Definition: This is the pharmacy where your electronic prescriptions will be sent.  We do not endorse any particular pharmacy, however, we have experienced problems with [facility] not securing enough medication supply for the community. We do not restrict you   in your choice of pharmacy. However, once we write for your prescriptions, we will NOT be re-sending more prescriptions to fix restricted supply problems created by your pharmacy, or your insurance.  The pharmacy listed in the electronic medical record should be the one where you want electronic prescriptions to be sent. If you choose to change pharmacy, simply notify our nursing staff.  Recommendations: Keep all of your pain medications in a safe place, under lock and key, even if you live alone. We will NOT replace lost, stolen, or damaged medication. After you fill your prescription, take 1 week's worth of pills and put them away in a safe place. You should keep a separate, properly labeled bottle for this purpose. The remainder should be kept in the original bottle. Use this as your primary supply, until it runs out. Once it's gone, then you know that you have 1 week's worth of medicine, and it is time to come in for a prescription refill. If you do this correctly, it is unlikely that you will ever run out of medicine. To make sure that the above recommendation works, it is very important that you make sure your medication refill appointments are scheduled at least 1 week before you run out of medicine. To do this in an effective manner, make sure that you do not leave the office without scheduling your next medication management appointment. Always ask the nursing staff to show you in your prescription , when your medication will be running out. Then arrange for the receptionist to get you a return appointment,  at least 7 days before you run out of medicine. Do not wait until you have 1 or 2 pills left, to come in. This is very poor planning and does not take into consideration that we may need to cancel appointments due to bad weather, sickness, or emergencies affecting our staff. DO NOT ACCEPT A "Partial Fill": If for any reason your pharmacy does not have enough pills/tablets to completely fill or refill your prescription, do not allow for a "partial fill". The law allows the pharmacy to complete that prescription within 72 hours, without requiring a new prescription. If they do not fill the rest of your prescription within those 72 hours, you will need a separate prescription to fill the remaining amount, which we will NOT provide. If the reason for the partial fill is your insurance, you will need to talk to the pharmacist about payment alternatives for the remaining tablets, but again, DO NOT ACCEPT A PARTIAL FILL, unless you can trust your pharmacist to obtain the remainder of the pills within 72 hours.  Prescription refills and/or changes in medication(s):  Prescription refills, and/or changes in dose or medication, will be conducted only during scheduled medication management appointments. (Applies to both, written and electronic prescriptions.) No refills on procedure days. No medication will be changed or started on procedure days. No changes, adjustments, and/or refills will be conducted on a procedure day. Doing so will interfere with the diagnostic portion of the procedure. No phone refills. No medications will be "called into the pharmacy". No Fax refills. No weekend refills. No Holliday refills. No after hours refills.  Remember:  Business hours are:  Monday to Thursday 8:00 AM to 4:00 PM Provider's Schedule: [name] Sia, MD - Appointments are:  Medication management: Monday and Wednesday 8:00 AM to 4:00 PM Procedure day: Tuesday and Thursday 7:30 AM to 4:00 PM [name] Lateef, MD -  Appointments are:  Medication management: Tuesday and Thursday 8:00   AM to 4:00 PM Procedure day: Monday and Wednesday 7:30 AM to 4:00 PM (Last update: 03/23/2020) ____________________________________________________________________________________________  ____________________________________________________________________________________________  CBD (cannabidiol) & Delta-8 (Delta-8 tetrahydrocannabinol) WARNING  Intro: Cannabidiol (CBD) and tetrahydrocannabinol (THC), are two natural compounds found in plants of the Cannabis genus. They can both be extracted from hemp or cannabis. Hemp and cannabis come from the Cannabis sativa plant. Both compounds interact with your body's endocannabinoid system, but they have very different effects. CBD does not produce the high sensation associated with cannabis. Delta-8 tetrahydrocannabinol, also known as delta-8 THC, is a psychoactive substance found in the Cannabis sativa plant, of which marijuana and hemp are two varieties. THC is responsible for the high associated with the illicit use of marijuana.  Applicable to: All individuals currently taking or considering taking CBD (cannabidiol) and, more important, all patients taking opioid analgesic controlled substances (pain medication). (Example: oxycodone; oxymorphone; hydrocodone; hydromorphone; morphine; methadone; tramadol; tapentadol; fentanyl; buprenorphine; butorphanol; dextromethorphan; meperidine; codeine; etc.)  Legal status: CBD remains a Schedule I drug prohibited for any use. CBD is illegal with one exception. In the [place], CBD has a limited Food and Drug Administration (FDA) approval for the treatment of two specific types of epilepsy disorders. Only one CBD product has been approved by the FDA for this purpose: "Epidiolex". FDA is aware that some companies are marketing products containing cannabis and cannabis-derived compounds in ways that violate the Federal Food, Drug and Cosmetic Act  (FD&C Act) and that may put the health and safety of consumers at risk. The FDA, a Federal agency, has not enforced the CBD status since 2018. UPDATE: (10/20/2021) The Drug Enforcement Agency (DEA) issued a letter stating that "delta" cannabinoids, including Delta-8-THCO and Delta-9-THCO, synthetically derived from hemp do not qualify as hemp and will be viewed as Schedule I drugs. (Schedule I drugs, substances, or chemicals are defined as drugs with no currently accepted medical use and a high potential for abuse. Some examples of Schedule I drugs are: heroin, lysergic acid diethylamide (LSD), marijuana (cannabis), 3,4-methylenedioxymethamphetamine (ecstasy), methaqualone, and peyote.) (https://www.dea.gov)  Legality: Some manufacturers ship CBD products nationally, which is illegal. Often such products are sold online and are therefore available throughout the country. CBD is openly sold in head shops and health food stores in some states where such sales have not been explicitly legalized. Selling unapproved products with unsubstantiated therapeutic claims is not only a violation of the law, but also can put patients at risk, as these products have not been proven to be safe or effective. Federal illegality makes it difficult to conduct research on CBD.  Reference: "FDA Regulation of Cannabis and Cannabis-Derived Products, Including Cannabidiol (CBD)" - https://www.fda.gov/news-events/public-health-focus/fda-regulation-cannabis-and-cannabis-derived-products-including-cannabidiol-cbd  Warning: CBD is not FDA approved and has not undergo the same manufacturing controls as prescription drugs.  This means that the purity and safety of available CBD may be questionable. Most of the time, despite manufacturer's claims, it is contaminated with THC (delta-9-tetrahydrocannabinol - the chemical in marijuana responsible for the "HIGH").  When this is the case, the THC contaminant will trigger a positive urine drug  screen (UDS) test for Marijuana (carboxy-THC). Because a positive UDS for any illicit substance is a violation of our medication agreement, your opioid analgesics (pain medicine) may be permanently discontinued. The FDA recently put out a warning about 5 things that everyone should be aware of regarding Delta-8 THC: Delta-8 THC products have not been evaluated or approved by the FDA for safe use and may be marketed in ways that put the   public health at risk. The FDA has received adverse event reports involving delta-8 THC-containing products. Delta-8 THC has psychoactive and intoxicating effects. Delta-8 THC manufacturing often involve use of potentially harmful chemicals to create the concentrations of delta-8 THC claimed in the marketplace. The final delta-8 THC product may have potentially harmful by-products (contaminants) due to the chemicals used in the process. Manufacturing of delta-8 THC products may occur in uncontrolled or unsanitary settings, which may lead to the presence of unsafe contaminants or other potentially harmful substances. Delta-8 THC products should be kept out of the reach of children and pets.  MORE ABOUT CBD  General Information: CBD was discovered in 1940 and it is a derivative of the cannabis sativa genus plants (Marijuana and Hemp). It is one of the 113 identified substances found in Marijuana. It accounts for up to 40% of the plant's extract. As of 2018, preliminary clinical studies on CBD included research for the treatment of anxiety, movement disorders, and pain. CBD is available and consumed in multiple forms, including inhalation of smoke or vapor, as an aerosol spray, and by mouth. It may be supplied as an oil containing CBD, capsules, dried cannabis, or as a liquid solution. CBD is thought not to be as psychoactive as THC (delta-9-tetrahydrocannabinol - the chemical in marijuana responsible for the "HIGH"). Studies suggest that CBD may interact with different  biological target receptors in the body, including cannabinoid and other neurotransmitter receptors. As of 2018 the mechanism of action for its biological effects has not been determined.  Side-effects  Adverse reactions: Dry mouth, diarrhea, decreased appetite, fatigue, drowsiness, malaise, weakness, sleep disturbances, and others.  Drug interactions: CBC may interact with other medications such as blood-thinners. Because CBD causes drowsiness on its own, it also increases the drowsiness caused by other medications, including antihistamines (such as Benadryl), benzodiazepines (Xanax, Ativan, Valium), antipsychotics, antidepressants and opioids, as well as alcohol and supplements such as kava, melatonin and [place]'s Wort. Be cautious with the following combinations:   Brivaracetam (Briviact) Brivaracetam is changed and broken down by the body. CBD might decrease how quickly the body breaks down brivaracetam. This might increase levels of brivaracetam in the body.  Caffeine Caffeine is changed and broken down by the body. CBD might decrease how quickly the body breaks down caffeine. This might increase levels of caffeine in the body.  Carbamazepine (Tegretol) Carbamazepine is changed and broken down by the body. CBD might decrease how quickly the body breaks down carbamazepine. This might increase levels of carbamazepine in the body and increase its side effects.  Citalopram (Celexa) Citalopram is changed and broken down by the body. CBD might decrease how quickly the body breaks down citalopram. This might increase levels of citalopram in the body and increase its side effects.  Clobazam (Onfi) Clobazam is changed and broken down by the liver. CBD might decrease how quickly the liver breaks down clobazam. This might increase the effects and side effects of clobazam.  Eslicarbazepine (Aptiom) Eslicarbazepine is changed and broken down by the body. CBD might decrease how quickly the body  breaks down eslicarbazepine. This might increase levels of eslicarbazepine in the body by a small amount.  Everolimus (Zostress) Everolimus is changed and broken down by the body. CBD might decrease how quickly the body breaks down everolimus. This might increase levels of everolimus in the body.  Lithium Taking higher doses of CBD might increase levels of lithium. This can increase the risk of lithium toxicity.  Medications changed by the liver (  Cytochrome P450 1A1 (CYP1A1) substrates) Some medications are changed and broken down by the liver. CBD might change how quickly the liver breaks down these medications. This could change the effects and side effects of these medications.  Medications changed by the liver (Cytochrome P450 1A2 (CYP1A2) substrates) Some medications are changed and broken down by the liver. CBD might change how quickly the liver breaks down these medications. This could change the effects and side effects of these medications.  Medications changed by the liver (Cytochrome P450 1B1 (CYP1B1) substrates) Some medications are changed and broken down by the liver. CBD might change how quickly the liver breaks down these medications. This could change the effects and side effects of these medications.  Medications changed by the liver (Cytochrome P450 2A6 (CYP2A6) substrates) Some medications are changed and broken down by the liver. CBD might change how quickly the liver breaks down these medications. This could change the effects and side effects of these medications.  Medications changed by the liver (Cytochrome P450 2B6 (CYP2B6) substrates) Some medications are changed and broken down by the liver. CBD might change how quickly the liver breaks down these medications. This could change the effects and side effects of these medications.  Medications changed by the liver (Cytochrome P450 2C19 (CYP2C19) substrates) Some medications are changed and broken down by the liver.  CBD might change how quickly the liver breaks down these medications. This could change the effects and side effects of these medications.  Medications changed by the liver (Cytochrome P450 2C8 (CYP2C8) substrates) Some medications are changed and broken down by the liver. CBD might change how quickly the liver breaks down these medications. This could change the effects and side effects of these medications.  Medications changed by the liver (Cytochrome P450 2C9 (CYP2C9) substrates) Some medications are changed and broken down by the liver. CBD might change how quickly the liver breaks down these medications. This could change the effects and side effects of these medications.  Medications changed by the liver (Cytochrome P450 2D6 (CYP2D6) substrates) Some medications are changed and broken down by the liver. CBD might change how quickly the liver breaks down these medications. This could change the effects and side effects of these medications.  Medications changed by the liver (Cytochrome P450 2E1 (CYP2E1) substrates) Some medications are changed and broken down by the liver. CBD might change how quickly the liver breaks down these medications. This could change the effects and side effects of these medications.  Medications changed by the liver (Cytochrome P450 3A4 (CYP3A4) substrates) Some medications are changed and broken down by the liver. CBD might change how quickly the liver breaks down these medications. This could change the effects and side effects of these medications.  Medications changed by the liver (Glucuronidated drugs) Some medications are changed and broken down by the liver. CBD might change how quickly the liver breaks down these medications. This could change the effects and side effects of these medications.  Medications that decrease the breakdown of other medications by the liver (Cytochrome P450 2C19 (CYP2C19) inhibitors) CBD is changed and broken down by the liver.  Some drugs decrease how quickly the liver changes and breaks down CBD. This could change the effects and side effects of CBD.  Medications that decrease the breakdown of other medications in the liver (Cytochrome P450 3A4 (CYP3A4) inhibitors) CBD is changed and broken down by the liver. Some drugs decrease how quickly the liver changes and breaks down CBD. This could change the effects   and side effects of CBD.  Medications that increase breakdown of other medications by the liver (Cytochrome P450 3A4 (CYP3A4) inducers) CBD is changed and broken down by the liver. Some drugs increase how quickly the liver changes and breaks down CBD. This could change the effects and side effects of CBD.  Medications that increase the breakdown of other medications by the liver (Cytochrome P450 2C19 (CYP2C19) inducers) CBD is changed and broken down by the liver. Some drugs increase how quickly the liver changes and breaks down CBD. This could change the effects and side effects of CBD.  Methadone (Dolophine) Methadone is broken down by the liver. CBD might decrease how quickly the liver breaks down methadone. Taking cannabidiol along with methadone might increase the effects and side effects of methadone.  Rufinamide (Banzel) Rufinamide is changed and broken down by the body. CBD might decrease how quickly the body breaks down rufinamide. This might increase levels of rufinamide in the body by a small amount.  Sedative medications (CNS depressants) CBD might cause sleepiness and slowed breathing. Some medications, called sedatives, can also cause sleepiness and slowed breathing. Taking CBD with sedative medications might cause breathing problems and/or too much sleepiness.  Sirolimus (Rapamune) Sirolimus is changed and broken down by the body. CBD might decrease how quickly the body breaks down sirolimus. This might increase levels of sirolimus in the body.  Stiripentol (Diacomit) Stiripentol is changed and  broken down by the body. CBD might decrease how quickly the body breaks down stiripentol. This might increase levels of stiripentol in the body and increase its side effects.  Tacrolimus (Prograf) Tacrolimus is changed and broken down by the body. CBD might decrease how quickly the body breaks down tacrolimus. This might increase levels of tacrolimus in the body.  Tamoxifen (Soltamox) Tamoxifen is changed and broken down by the body. CBD might affect how quickly the body breaks down tamoxifen. This might affect levels of tamoxifen in the body.  Topiramate (Topamax) Topiramate is changed and broken down by the body. CBD might decrease how quickly the body breaks down topiramate. This might increase levels of topiramate in the body by a small amount.  Valproate Valproic acid can cause liver injury. Taking cannabidiol with valproic acid might increase the chance of liver injury. CBD and/or valproic acid might need to be stopped, or the dose might need to be reduced.  Warfarin (Coumadin) CBD might increase levels of warfarin, which can increase the risk for bleeding. CBD and/or warfarin might need to be stopped, or the dose might need to be reduced.  Zonisamide Zonisamide is changed and broken down by the body. CBD might decrease how quickly the body breaks down zonisamide. This might increase levels of zonisamide in the body by a small amount. (Last update: 11/01/2021) ____________________________________________________________________________________________  ____________________________________________________________________________________________  Drug Holidays (Slow)  What is a "Drug Holiday"? Drug Holiday: is the name given to the period of time during which a patient stops taking a medication(s) for the purpose of eliminating tolerance to the drug.  Benefits Improved effectiveness of opioids. Decreased opioid dose needed to achieve benefits. Improved pain with lesser  dose.  What is tolerance? Tolerance: is the progressive decreased in effectiveness of a drug due to its repetitive use. With repetitive use, the body gets use to the medication and as a consequence, it loses its effectiveness. This is a common problem seen with opioid pain medications. As a result, a larger dose of the drug is needed to achieve the same effect that   used to be obtained with a smaller dose.  How long should a "Drug Holiday" last? You should stay off of the pain medicine for at least 14 consecutive days. (2 weeks)  Should I stop the medicine "cold turkey"? No. You should always coordinate with your Pain Specialist so that he/she can provide you with the correct medication dose to make the transition as smoothly as possible.  How do I stop the medicine? Slowly. You will be instructed to decrease the daily amount of pills that you take by one (1) pill every seven (7) days. This is called a "slow downward taper" of your dose. For example: if you normally take four (4) pills per day, you will be asked to drop this dose to three (3) pills per day for seven (7) days, then to two (2) pills per day for seven (7) days, then to one (1) per day for seven (7) days, and at the end of those last seven (7) days, this is when the "Drug Holiday" would start.   Will I have withdrawals? By doing a "slow downward taper" like this one, it is unlikely that you will experience any significant withdrawal symptoms. Typically, what triggers withdrawals is the sudden stop of a high dose opioid therapy. Withdrawals can usually be avoided by slowly decreasing the dose over a prolonged period of time. If you do not follow these instructions and decide to stop your medication abruptly, withdrawals may be possible.  What are withdrawals? Withdrawals: refers to the wide range of symptoms that occur after stopping or dramatically reducing opiate drugs after heavy and prolonged use. Withdrawal symptoms do not occur to  patients that use low dose opioids, or those who take the medication sporadically. Contrary to benzodiazepine (example: Valium, Xanax, etc.) or alcohol withdrawals ("Delirium Tremens"), opioid withdrawals are not lethal. Withdrawals are the physical manifestation of the body getting rid of the excess receptors.  Expected Symptoms Early symptoms of withdrawal may include: Agitation Anxiety Muscle aches Increased tearing Insomnia Runny nose Sweating Yawning  Late symptoms of withdrawal may include: Abdominal cramping Diarrhea Dilated pupils Goose bumps Nausea Vomiting  Will I experience withdrawals? Due to the slow nature of the taper, it is very unlikely that you will experience any.  What is a slow taper? Taper: refers to the gradual decrease in dose.  (Last update: 03/23/2020) ____________________________________________________________________________________________   ____________________________________________________________________________________________  Pharmacy Shortages of Pain Medication   Introduction Shockingly as it may seem, .  "No U.S[place] Supreme Court decision has ever interpreted the Constitution as guaranteeing a right to health care for all Americans." - https://www.healthequityandpolicylab.com/elusive-right-to-health-care-under-us-law  "With respect to human rights, the [place] has no formally codified right to health, nor does it participate in a human rights treaty that specifies a right to health." - Scott J. Schweikart, JD, MBE  Situation By now, most of our patients have had the experience of being told by their pharmacist that they do not have enough medication to cover their prescription. If you have not had this experience, just know that you soon will.  Problem There appears to be a shortage of these medications, either at the national level or locally. This is happening with all pharmacies. When there is not enough medication, patients  are offered a partial fill and they are told that they will try to get the rest of the medicine for them at a later time. If they do not have enough for even a partial fill, the pharmacists are telling the patients to   call us (the prescribing physicians) to request that we send another prescription to another pharmacy to get the medicine.   This reordering of a controlled substance creates documentation problems where additional paperwork needs to be created to explain why two prescriptions for the same period of time and the same medicine are being prescribed to the same patient. It also creates situations where the last appointment note does not accurately reflect when and what prescriptions were given to a patient. This leads to prescribing errors down the line, in subsequent follow-up visits.   [place] Board [employer]acy ([place]) Research revealed that Board of Pharmacy Rule .1806 (21 NCAC 46.1806) authorizes pharmacists to the transfer of prescriptions among pharmacies, and it sets forth procedural and recordkeeping requirements for doing so. However, this requires the pharmacist to complete the previously mentioned procedural paperwork to accomplish the transfer. As it turns out, it is much easier for them to have the prescribing physicians do the work.   Possible solutions 1. Have the [place] Assembly add a provision to the "STOP ACT" (the law that mandates how controlled substances are prescribed) where there is an exception to the electronic prescribing rule that states that in the event there are shortages of medications the physicians are allowed to use written prescriptions as opposed to electronic ones. This would allow patients to take their prescriptions to a different pharmacy that may have enough medication available to fill the prescription. The problem is that currently there is a law that does not allow for written prescriptions, with the exception of instances where the  electronic medical record is down due to technical issues.  2. Have [place] Congress ease the pressure on pharmaceutical companies, allowing them to produce enough quantities of the medication to adequately supply the population. 3. Have pharmacies keep enough stocks of these medications to cover their client base.  4. Have the [place] Assembly add a provision to the "STOP ACT" where they ease the regulations surrounding the transfer of controlled substances between pharmacies, so as to simplify the transfer of supplies. As an alternative, develop a system to allow patients to obtain the remainder of their prescription at another one of their pharmacies or at an associate pharmacy.   How this shortage will affect you.  The one thing that is abundantly clear is that this is a pharmacy supply problem  and not a prescriber problem. The job of the prescriber is to evaluate and monitor the patients for the appropriate indications to the use of these medicines, the monitoring of their use and the prescribing of the appropriate dose and regimen. It is not the job of the prescriber to provide or dispense the actual medication. By law, this is the job of the pharmacies and pharmacists. It is certainly not the job of the prescriber to solve the supply problems.   Due to the above problems we are no longer taking patients to write for their pain medication. We will continue to evaluate for appropriate indications and we may provide recommendations regarding medication, dose, and schedule, as well as monitoring recommendations, however, we will not be taking over the actual prescribing of these substances. On those patients where we are treating their chronic pain with interventional therapies, exceptions will be considered on a case by case basis. At this time, we will try to continue providing this supplemental service to those patients we have been managing in the past. However, as of August 1st, 2023, we no  longer   will be sending additional prescriptions to other pharmacies for the purpose of solving their supply problems. Once we send a prescription to a pharmacy, we will not be resending it again to another pharmacy to cover for their shortages.   What to do. Write as many letters as you can. Recruit the help of family members in writing these letters. Below are some of the places where you can write to make your voice heard. Let them know what the problem is and push them to look for solutions.   Search internet for: "[place] find your legislators" https://www.ncleg.gov/findyourlegislators  Search internet for: "[place] insurance commissioner complaints" https://www.ncdoi.gov/contactscomplaints/assistance-or-file-complaint  Search internet for: "[place] Board of Pharmacy complaints" http://www.ncbop.org/contact.htm  Search internet for: "[facility] pharmacy complaints" Email [facility] Customer Relations https://www.[facility].com/help/email-[email or web address].jsp?callType=store  Search internet for: "[facility] pharmacy customer service complaints" https://www.[facility].com/topic/marketing/contactus/contactus_customerservice.jsp  ____________________________________________________________________________________________   

## 2022-03-23 ENCOUNTER — Ambulatory Visit (INDEPENDENT_AMBULATORY_CARE_PROVIDER_SITE_OTHER): Payer: Medicare Other | Admitting: Vascular Surgery

## 2022-03-23 ENCOUNTER — Encounter (INDEPENDENT_AMBULATORY_CARE_PROVIDER_SITE_OTHER): Payer: Medicare Other

## 2022-04-25 ENCOUNTER — Other Ambulatory Visit: Payer: Self-pay

## 2022-04-25 ENCOUNTER — Emergency Department: Payer: Medicare Other

## 2022-04-25 DIAGNOSIS — Z7902 Long term (current) use of antithrombotics/antiplatelets: Secondary | ICD-10-CM | POA: Diagnosis not present

## 2022-04-25 DIAGNOSIS — Z951 Presence of aortocoronary bypass graft: Secondary | ICD-10-CM | POA: Diagnosis not present

## 2022-04-25 DIAGNOSIS — Z7982 Long term (current) use of aspirin: Secondary | ICD-10-CM | POA: Insufficient documentation

## 2022-04-25 DIAGNOSIS — E1122 Type 2 diabetes mellitus with diabetic chronic kidney disease: Secondary | ICD-10-CM | POA: Insufficient documentation

## 2022-04-25 DIAGNOSIS — N1831 Chronic kidney disease, stage 3a: Secondary | ICD-10-CM | POA: Insufficient documentation

## 2022-04-25 DIAGNOSIS — Z79899 Other long term (current) drug therapy: Secondary | ICD-10-CM | POA: Diagnosis not present

## 2022-04-25 DIAGNOSIS — I25110 Atherosclerotic heart disease of native coronary artery with unstable angina pectoris: Secondary | ICD-10-CM | POA: Diagnosis not present

## 2022-04-25 DIAGNOSIS — I129 Hypertensive chronic kidney disease with stage 1 through stage 4 chronic kidney disease, or unspecified chronic kidney disease: Secondary | ICD-10-CM | POA: Diagnosis not present

## 2022-04-25 DIAGNOSIS — Z794 Long term (current) use of insulin: Secondary | ICD-10-CM | POA: Diagnosis not present

## 2022-04-25 DIAGNOSIS — Z87891 Personal history of nicotine dependence: Secondary | ICD-10-CM | POA: Diagnosis not present

## 2022-04-25 DIAGNOSIS — R778 Other specified abnormalities of plasma proteins: Secondary | ICD-10-CM | POA: Diagnosis not present

## 2022-04-25 DIAGNOSIS — Z955 Presence of coronary angioplasty implant and graft: Secondary | ICD-10-CM | POA: Diagnosis not present

## 2022-04-25 DIAGNOSIS — R0789 Other chest pain: Secondary | ICD-10-CM | POA: Diagnosis present

## 2022-04-25 DIAGNOSIS — M47816 Spondylosis without myelopathy or radiculopathy, lumbar region: Principal | ICD-10-CM | POA: Insufficient documentation

## 2022-04-25 DIAGNOSIS — E1165 Type 2 diabetes mellitus with hyperglycemia: Secondary | ICD-10-CM | POA: Insufficient documentation

## 2022-04-25 DIAGNOSIS — R7401 Elevation of levels of liver transaminase levels: Secondary | ICD-10-CM | POA: Insufficient documentation

## 2022-04-25 LAB — COMPREHENSIVE METABOLIC PANEL
ALT: 48 U/L — ABNORMAL HIGH (ref 0–44)
AST: 52 U/L — ABNORMAL HIGH (ref 15–41)
Albumin: 4.1 g/dL (ref 3.5–5.0)
Alkaline Phosphatase: 88 U/L (ref 38–126)
Anion gap: 9 (ref 5–15)
BUN: 25 mg/dL — ABNORMAL HIGH (ref 8–23)
CO2: 24 mmol/L (ref 22–32)
Calcium: 8.8 mg/dL — ABNORMAL LOW (ref 8.9–10.3)
Chloride: 105 mmol/L (ref 98–111)
Creatinine, Ser: 1.08 mg/dL (ref 0.61–1.24)
GFR, Estimated: >60 mL/min (ref >60)
Glucose, Bld: 317 mg/dL — ABNORMAL HIGH (ref 70–99)
Potassium: 4.1 mmol/L (ref 3.5–5.1)
Sodium: 138 mmol/L (ref 135–145)
Total Bilirubin: 1 mg/dL (ref 0.3–1.2)
Total Protein: 7.6 g/dL (ref 6.5–8.1)

## 2022-04-25 LAB — CBC
HCT: 36.7 % — ABNORMAL LOW (ref 39.0–52.0)
Hemoglobin: 12.6 g/dL — ABNORMAL LOW (ref 13.0–17.0)
MCH: 29.9 pg (ref 26.0–34.0)
MCHC: 34.3 g/dL (ref 30.0–36.0)
MCV: 87 fL (ref 80.0–100.0)
Platelets: 182 10*3/uL (ref 150–400)
RBC: 4.22 MIL/uL (ref 4.22–5.81)
RDW: 12.7 % (ref 11.5–15.5)
WBC: 7.4 10*3/uL (ref 4.0–10.5)
nRBC: 0 % (ref 0.0–0.2)

## 2022-04-25 LAB — TROPONIN I (HIGH SENSITIVITY): Troponin I (High Sensitivity): 7 ng/L (ref <18)

## 2022-04-25 LAB — ETHANOL: Alcohol, Ethyl (B): <10 mg/dL (ref <10)

## 2022-04-25 NOTE — ED Triage Notes (Signed)
Pt presents to ER via ems from home with c/o chest pain that goes across his whole chest that started appx 2 hours ago and radiates down both arms.  Pt denies sob or dizziness with pain.  Pt states he does have cardiac hx with prior open heart surgery.  Pt is A&O x4 at this time in NAD in triage.

## 2022-04-26 ENCOUNTER — Observation Stay
Admission: EM | Admit: 2022-04-26 | Discharge: 2022-04-27 | Disposition: A | Discharge status: Home / Self Care | Payer: Medicare Other | Attending: Internal Medicine | Admitting: Internal Medicine

## 2022-04-26 DIAGNOSIS — Z951 Presence of aortocoronary bypass graft: Secondary | ICD-10-CM

## 2022-04-26 DIAGNOSIS — E1165 Type 2 diabetes mellitus with hyperglycemia: Secondary | ICD-10-CM | POA: Diagnosis present

## 2022-04-26 DIAGNOSIS — I10 Essential (primary) hypertension: Secondary | ICD-10-CM | POA: Diagnosis present

## 2022-04-26 DIAGNOSIS — E1122 Type 2 diabetes mellitus with diabetic chronic kidney disease: Secondary | ICD-10-CM | POA: Diagnosis present

## 2022-04-26 DIAGNOSIS — M47817 Spondylosis without myelopathy or radiculopathy, lumbosacral region: Secondary | ICD-10-CM | POA: Diagnosis present

## 2022-04-26 DIAGNOSIS — R7989 Other specified abnormal findings of blood chemistry: Secondary | ICD-10-CM

## 2022-04-26 DIAGNOSIS — R0789 Other chest pain: Secondary | ICD-10-CM

## 2022-04-26 DIAGNOSIS — I200 Unstable angina: Secondary | ICD-10-CM | POA: Diagnosis not present

## 2022-04-26 DIAGNOSIS — M47816 Spondylosis without myelopathy or radiculopathy, lumbar region: Secondary | ICD-10-CM | POA: Diagnosis not present

## 2022-04-26 DIAGNOSIS — G4733 Obstructive sleep apnea (adult) (pediatric): Secondary | ICD-10-CM | POA: Diagnosis present

## 2022-04-26 DIAGNOSIS — R778 Other specified abnormalities of plasma proteins: Secondary | ICD-10-CM

## 2022-04-26 DIAGNOSIS — N1832 Chronic kidney disease, stage 3b: Secondary | ICD-10-CM

## 2022-04-26 LAB — TROPONIN I (HIGH SENSITIVITY)
Troponin I (High Sensitivity): 29 ng/L — ABNORMAL HIGH (ref <18)
Troponin I (High Sensitivity): 28 ng/L — ABNORMAL HIGH (ref <18)
Troponin I (High Sensitivity): 21 ng/L — ABNORMAL HIGH (ref <18)

## 2022-04-26 LAB — PROTIME-INR
INR: 1.1 (ref 0.8–1.2)
Prothrombin Time: 14.1 seconds (ref 11.4–15.2)

## 2022-04-26 LAB — APTT: aPTT: 35 seconds (ref 24–36)

## 2022-04-26 LAB — HEMOGLOBIN A1C
Hgb A1c MFr Bld: 6.9 % — ABNORMAL HIGH (ref 4.8–5.6)
Mean Plasma Glucose: 151.33 mg/dL

## 2022-04-26 LAB — GLUCOSE, CAPILLARY
Glucose-Capillary: 121 mg/dL — ABNORMAL HIGH (ref 70–99)
Glucose-Capillary: 118 mg/dL — ABNORMAL HIGH (ref 70–99)

## 2022-04-26 LAB — CBG MONITORING, ED
Glucose-Capillary: 138 mg/dL — ABNORMAL HIGH (ref 70–99)
Glucose-Capillary: 151 mg/dL — ABNORMAL HIGH (ref 70–99)

## 2022-04-26 LAB — HIV ANTIBODY (ROUTINE TESTING W REFLEX): HIV Screen 4th Generation wRfx: NONREACTIVE

## 2022-04-26 MED ORDER — NITROGLYCERIN 0.4 MG SL SUBL: 0.4000 mg | SUBLINGUAL_TABLET | SUBLINGUAL | Status: DC | PRN

## 2022-04-26 MED ORDER — ASPIRIN 81 MG PO TBEC
81.0000 mg | DELAYED_RELEASE_TABLET | Freq: Every day | ORAL | Status: DC
  Administered 2022-04-27: 81 mg via ORAL
  Filled 2022-04-26: qty 1

## 2022-04-26 MED ORDER — KETOROLAC TROMETHAMINE 30 MG/ML IJ SOLN
30.0000 mg | Freq: Once | INTRAMUSCULAR | Status: AC
  Administered 2022-04-26: 30 mg via INTRAVENOUS
  Filled 2022-04-26: qty 1

## 2022-04-26 MED ORDER — METOPROLOL TARTRATE 50 MG PO TABS
50.0000 mg | ORAL_TABLET | Freq: Three times a day (TID) | ORAL | Status: DC
  Administered 2022-04-26 – 2022-04-27 (×4): 50 mg via ORAL
  Filled 2022-04-26 (×4): qty 1

## 2022-04-26 MED ORDER — ALLOPURINOL 100 MG PO TABS
100.0000 mg | ORAL_TABLET | Freq: Every day | ORAL | Status: DC
  Administered 2022-04-26 – 2022-04-27 (×2): 100 mg via ORAL
  Filled 2022-04-26 (×2): qty 1

## 2022-04-26 MED ORDER — LOSARTAN POTASSIUM 50 MG PO TABS
50.0000 mg | ORAL_TABLET | Freq: Every day | ORAL | Status: DC
  Administered 2022-04-26 – 2022-04-27 (×2): 50 mg via ORAL
  Filled 2022-04-26 (×2): qty 1

## 2022-04-26 MED ORDER — ASPIRIN 81 MG PO TBEC: 81.0000 mg | DELAYED_RELEASE_TABLET | Freq: Every day | ORAL | Status: DC

## 2022-04-26 MED ORDER — CLOPIDOGREL BISULFATE 75 MG PO TABS
75.0000 mg | ORAL_TABLET | Freq: Every day | ORAL | Status: DC
  Administered 2022-04-26 – 2022-04-27 (×2): 75 mg via ORAL
  Filled 2022-04-26 (×2): qty 1

## 2022-04-26 MED ORDER — ISOSORBIDE MONONITRATE ER 30 MG PO TB24
30.0000 mg | ORAL_TABLET | Freq: Every day | ORAL | Status: DC
  Administered 2022-04-26 – 2022-04-27 (×2): 30 mg via ORAL
  Filled 2022-04-26 (×2): qty 1

## 2022-04-26 MED ORDER — LOSARTAN POTASSIUM 50 MG PO TABS
100.0000 mg | ORAL_TABLET | Freq: Every day | ORAL | Status: DC
Start: 2022-04-26 — End: 2022-04-26

## 2022-04-26 MED ORDER — HYDROCODONE-ACETAMINOPHEN 5-325 MG PO TABS: 1.0000 | ORAL_TABLET | Freq: Two times a day (BID) | ORAL | Status: DC | PRN

## 2022-04-26 MED ORDER — ONDANSETRON HCL 4 MG/2ML IJ SOLN: 4.0000 mg | Freq: Four times a day (QID) | INTRAMUSCULAR | Status: DC | PRN

## 2022-04-26 MED ORDER — INSULIN DETEMIR 100 UNIT/ML ~~LOC~~ SOLN
40.0000 [IU] | Freq: Two times a day (BID) | SUBCUTANEOUS | Status: DC
Start: 2022-04-26 — End: 2022-04-27
  Administered 2022-04-26 (×2): 40 [IU] via SUBCUTANEOUS
  Filled 2022-04-26 (×4): qty 0.4

## 2022-04-26 MED ORDER — ASPIRIN 81 MG PO CHEW
324.0000 mg | CHEWABLE_TABLET | Freq: Once | ORAL | Status: AC
  Administered 2022-04-26: 324 mg via ORAL
  Filled 2022-04-26: qty 4

## 2022-04-26 MED ORDER — HEPARIN (PORCINE) 25000 UT/250ML-% IV SOLN
950.0000 [IU]/h | INTRAVENOUS | Status: DC
  Administered 2022-04-26: 950 [IU]/h via INTRAVENOUS
  Filled 2022-04-26: qty 250

## 2022-04-26 MED ORDER — GABAPENTIN 600 MG PO TABS
600.0000 mg | ORAL_TABLET | Freq: Two times a day (BID) | ORAL | Status: DC
  Administered 2022-04-26 – 2022-04-27 (×3): 600 mg via ORAL
  Filled 2022-04-26 (×3): qty 1

## 2022-04-26 MED ORDER — ACETAMINOPHEN 325 MG PO TABS: 650.0000 mg | ORAL_TABLET | ORAL | Status: DC | PRN

## 2022-04-26 MED ORDER — ATORVASTATIN CALCIUM 20 MG PO TABS
40.0000 mg | ORAL_TABLET | Freq: Every day | ORAL | Status: DC
  Administered 2022-04-26: 40 mg via ORAL
  Filled 2022-04-26: qty 2

## 2022-04-26 MED ORDER — HEPARIN BOLUS VIA INFUSION
4000.0000 [IU] | Freq: Once | INTRAVENOUS | Status: AC
  Administered 2022-04-26: 4000 [IU] via INTRAVENOUS
  Filled 2022-04-26: qty 4000

## 2022-04-26 MED ORDER — ADULT MULTIVITAMIN W/MINERALS CH
1.0000 | ORAL_TABLET | Freq: Every evening | ORAL | Status: DC
  Administered 2022-04-26: 1 via ORAL
  Filled 2022-04-26: qty 1

## 2022-04-26 MED ORDER — INSULIN ASPART 100 UNIT/ML IJ SOLN
0.0000 [IU] | Freq: Three times a day (TID) | INTRAMUSCULAR | Status: DC
  Administered 2022-04-26: 3 [IU] via SUBCUTANEOUS
  Administered 2022-04-26 (×2): 2 [IU] via SUBCUTANEOUS
  Filled 2022-04-26 (×3): qty 1

## 2022-04-26 MED ORDER — NITROGLYCERIN 2 % TD OINT
1.0000 [in_us] | TOPICAL_OINTMENT | Freq: Once | TRANSDERMAL | Status: AC
  Administered 2022-04-26: 1 [in_us] via TOPICAL
  Filled 2022-04-26: qty 1

## 2022-04-26 MED ORDER — INSULIN DETEMIR 100 UNIT/ML FLEXPEN: 50.0000 [IU] | PEN_INJECTOR | Freq: Two times a day (BID) | SUBCUTANEOUS | Status: DC

## 2022-04-26 MED ORDER — VITAMIN D 25 MCG (1000 UNIT) PO TABS
2000.0000 [IU] | ORAL_TABLET | Freq: Every day | ORAL | Status: DC
  Administered 2022-04-26 – 2022-04-27 (×2): 2000 [IU] via ORAL
  Filled 2022-04-26 (×2): qty 2

## 2022-04-26 NOTE — Assessment & Plan Note (Signed)
Treatment as outlined in 1 

## 2022-04-26 NOTE — Assessment & Plan Note (Addendum)
Patient with a known history of coronary artery disease status post CABG, status post PCI with stent angioplasty who presents to the ER for evaluation of chest pressure. Twelve-lead EKG shows no acute ST or T wave changes Continue heparin drip initiated in the ER Continue aspirin, Plavix, nitrates, metoprolol and high intensity statin Cycle troponin levels Obtain 2D echocardiogram to assess LVEF and rule out regional wall motion abnormality

## 2022-04-26 NOTE — Assessment & Plan Note (Signed)
Stable Continue chronic opioid therapy

## 2022-04-26 NOTE — Assessment & Plan Note (Signed)
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

## 2022-04-26 NOTE — Assessment & Plan Note (Signed)
Continue metoprolol, nitrates and losartan

## 2022-04-26 NOTE — Progress Notes (Signed)
ANTICOAGULATION CONSULT NOTE  Pharmacy Consult for heparin infusion Indication: Unstable angina  No Known Allergies  Patient Measurements: Height: '5\' 3"'$  (160 cm) Weight: 72.1 kg (159 lb) IBW/kg (Calculated) : 56.9 Heparin Dosing Weight: 71.4 kg  Vital Signs: Temp: 97.7 F (36.5 C) (08/24 0417) Temp Source: Oral (08/24 0417) BP: 156/74 (08/24 0417) Pulse Rate: 73 (08/24 0417)  Labs: Recent Labs    04/25/22 2304 04/26/22 0418  HGB 12.6*  --   HCT 36.7*  --   PLT 182  --   CREATININE 1.08  --   TROPONINIHS 7 29*    Estimated Creatinine Clearance: 58.3 mL/min (by C-G formula based on SCr of 1.08 mg/dL).   Medical History: Past Medical History:  Diagnosis Date   Benign esophageal stricture    Cardiomyopathy, secondary ([place])    Coronary artery disease    Diabetes mellitus without complication (HCC)    Diverticulitis    GI bleed    Gout    Hemorrhoids    Hyperlipidemia    Hypertension    Sleep apnea    Tubular adenoma of colon     Assessment: Pt is a 66 yo male with hx of cardiac disease presenting to ED c/o sudden onset of chest pain radiating into both arms.  Goal of Therapy:  Heparin level 0.3-0.7 units/ml Monitor platelets by anticoagulation protocol: Yes   Plan:  Bolus 4000 units x 1 Start heparin infusion at 950 units/hr Will check HL in 6 hr after start of infusion CBC daily while on heparin  Renda Rolls, PharmD, [facility] 04/26/2022 6:14 AM

## 2022-04-26 NOTE — Plan of Care (Signed)
  Problem: Activity: Goal: Ability to tolerate increased activity will improve Outcome: Progressing   Problem: Cardiac: Goal: Ability to achieve and maintain adequate cardiovascular perfusion will improve Outcome: Progressing   Problem: Education: Goal: Knowledge of General Education information will improve Description: Including pain rating scale, medication(s)/side effects and non-pharmacologic comfort measures Outcome: Progressing   Problem: Pain Managment: Goal: General experience of comfort will improve Outcome: Progressing   Problem: Safety: Goal: Ability to remain free from injury will improve Outcome: Progressing

## 2022-04-26 NOTE — Assessment & Plan Note (Signed)
Patient with a known history of insulin-dependent diabetes mellitus Labs show hyperglycemia Continue long-acting insulin Maintain consistent carbohydrate diet Glycemic control with sliding scale insulin

## 2022-04-26 NOTE — ED Notes (Signed)
Pt. Up to toilet in room independently, gait steady, NAD. Cardiac monitoring reconnected when pt. Returned to bed.

## 2022-04-26 NOTE — ED Provider Notes (Signed)
[facility] Provider Note    Event Date/Time   First MD Initiated Contact with Patient 04/26/22 0402     (approximate)   History   Chest Pain   HPI  [name] [name]. is a 66 y.o. male with extensive previous cardiac history including 2 different CABGs and 2 prior stents by PCI.  He has seen Dr. Ubaldo Glassing and Dr. Corky Sox in the past, and he will see Dr. Hessie Dibble next month.  He presents for evaluation of chest pain and pressure.  He said that it started abruptly while he was watching TV.  He does not typically have episodes of chest pain and certainly not at rest.  He has been compliant with his medications.  No recent trauma.  No difficulty breathing, just heaviness on his chest "like someone is standing on my chest".  He feels a little bit better now that he did earlier but is still symptomatic.     Physical Exam   Triage Vital Signs: ED Triage Vitals  Enc Vitals Group     BP 04/25/22 2258 (!) 186/74     Pulse Rate 04/25/22 2258 83     Resp 04/25/22 2258 18     Temp 04/25/22 2258 98.5 F (36.9 C)     Temp Source 04/25/22 2258 Oral     SpO2 04/25/22 2258 98 %     Weight 04/25/22 2259 72.1 kg (159 lb)     Height 04/25/22 2259 1.6 m ('5\' 3"'$ )     Head Circumference --      Peak Flow --      Pain Score 04/25/22 2258 5     Pain Loc --      Pain Edu? --      Excl. in [place]? --     Most recent vital signs: Vitals:   04/26/22 0730 04/26/22 0800  BP: 114/67   Pulse: 72   Resp: 15   Temp:    SpO2: 99% 98%     General: Awake, no distress.  CV:  Good peripheral perfusion.  Mild systolic murmur, otherwise normal heart sounds.   Resp:  Normal effort.  Lungs clear to auscultation bilaterally. Abd:  No distention.  Note is to palpation of the abdomen.  No abdominal bruit. Other:  Awake and alert, mood and affect are appropriate, no focal neurological deficits.   ED Results / Procedures / Treatments   Labs (all labs ordered are listed, but only abnormal  results are displayed) Labs Reviewed  CBC - Abnormal; Notable for the following components:      Result Value   Hemoglobin 12.6 (*)    HCT 36.7 (*)    All other components within normal limits  COMPREHENSIVE METABOLIC PANEL - Abnormal; Notable for the following components:   Glucose, Bld 317 (*)    BUN 25 (*)    Calcium 8.8 (*)    AST 52 (*)    ALT 48 (*)    All other components within normal limits  TROPONIN I (HIGH SENSITIVITY) - Abnormal; Notable for the following components:   Troponin I (High Sensitivity) 29 (*)    All other components within normal limits  ETHANOL  APTT  PROTIME-INR  HEPARIN LEVEL (UNFRACTIONATED)  HIV ANTIBODY (ROUTINE TESTING W REFLEX)  HEMOGLOBIN A1C  TROPONIN I (HIGH SENSITIVITY)  TROPONIN I (HIGH SENSITIVITY)     EKG  ED ECG REPORT I, Hinda Kehr, the attending physician, personally viewed and interpreted this ECG.  Date: 04/25/2022  EKG Time: 22: 51 Rate: 81 Rhythm: normal sinus rhythm QRS Axis: normal Intervals: Right bundle branch block, LVH ST/T Wave abnormalities: Non-specific ST segment / T-wave changes, but no clear evidence of acute ischemia. Narrative Interpretation: no definitive evidence of acute ischemia; does not meet STEMI criteria.  Similar morphology to prior EKG from November 2022    RADIOLOGY I viewed and interpreted the patient's two-view chest x-ray.  No evidence of pneumonia or widened mediastinum.  I also read the radiologist's report, which confirmed no acute findings.    PROCEDURES:  Critical Care performed: Yes, see critical care procedure note(s)  .1-3 Lead EKG Interpretation  Performed by: Hinda Kehr, MD Authorized by: Hinda Kehr, MD     Interpretation: normal     ECG rate:  70   ECG rate assessment: normal     Rhythm: sinus rhythm     Ectopy: none     Conduction: normal   .Critical Care  Performed by: Hinda Kehr, MD Authorized by: Hinda Kehr, MD   Critical care provider statement:     Critical care time (minutes):  30   Critical care time was exclusive of:  Separately billable procedures and treating other patients   Critical care was necessary to treat or prevent imminent or life-threatening deterioration of the following conditions:  Cardiac failure (unstable angina with elevated troponin (possible NSTEMI))   Critical care was time spent personally by me on the following activities:  Development of treatment plan with patient or surrogate, evaluation of patient's response to treatment, examination of patient, obtaining history from patient or surrogate, ordering and performing treatments and interventions, ordering and review of laboratory studies, ordering and review of radiographic studies, pulse oximetry, re-evaluation of patient's condition and review of old charts    MEDICATIONS ORDERED IN ED: Medications  heparin ADULT infusion 100 units/mL (25000 units/262m) (950 Units/hr Intravenous New Bag/Given 04/26/22 0656)  allopurinol (ZYLOPRIM) tablet 100 mg (has no administration in time range)  HYDROcodone-acetaminophen (NORCO/VICODIN) 5-325 MG per tablet 1 tablet (has no administration in time range)  atorvastatin (LIPITOR) tablet 40 mg (has no administration in time range)  isosorbide mononitrate (IMDUR) 24 hr tablet 30 mg (has no administration in time range)  losartan (COZAAR) tablet 100 mg (has no administration in time range)  metoprolol tartrate (LOPRESSOR) tablet 50 mg (has no administration in time range)  clopidogrel (PLAVIX) tablet 75 mg (has no administration in time range)  gabapentin (NEURONTIN) tablet 600 mg (has no administration in time range)  cholecalciferol (VITAMIN D3) 25 MCG (1000 UNIT) tablet 2,000 Units (has no administration in time range)  multivitamin with minerals tablet 1 tablet (has no administration in time range)  aspirin EC tablet 81 mg (has no administration in time range)  nitroGLYCERIN (NITROSTAT) SL tablet 0.4 mg (has no administration  in time range)  acetaminophen (TYLENOL) tablet 650 mg (has no administration in time range)  ondansetron (ZOFRAN) injection 4 mg (has no administration in time range)  insulin detemir (LEVEMIR) FlexPen 40 Units (has no administration in time range)  insulin aspart (novoLOG) injection 0-15 Units (has no administration in time range)  nitroGLYCERIN (NITROGLYN) 2 % ointment 1 inch (1 inch Topical Given 04/26/22 0646)  aspirin chewable tablet 324 mg (324 mg Oral Given 04/26/22 0646)  heparin bolus via infusion 4,000 Units (4,000 Units Intravenous Bolus from Bag 04/26/22 0657)     IMPRESSION / MDM / [place]/ ED COURSE  I reviewed the triage vital signs and the nursing notes.  Differential diagnosis includes, but is not limited to, ACS including unstable angina, stable angina, pneumonia, electrolyte or metabolic abnormality.  Patient's presentation is most consistent with acute presentation with potential threat to life or bodily function.  Labs/studies ordered include comprehensive metabolic panel, CBC, ethanol, high-sensitivity troponin x2, coagulation studies, EKG, two-view chest x-ray.  EKG shows no significant change from prior and no obvious ischemia.  Chest x-ray is as documented above, no acute findings and interpreted by me  Initial high-sensitivity troponin is normal.  Comprehensive metabolic panel is essentially normal other than very mild LFT elevation which is not likely to be clinically significant in the absence of abdominal pain, and he does have some hyperglycemia at 317.  Coagulation studies are normal.  I discussed my concerns with the patient that his presentation is most representative of unstable angina.  However when I saw him it was time to get a repeat troponin.  We agreed that we would reassess how he is feeling after the repeat troponin to determine if he needs to stay in the hospital or if he could be followed up as an  outpatient.  The patient is on the cardiac monitor to evaluate for evidence of arrhythmia and/or significant heart rate changes.   Clinical Course as of 04/26/22 6294  Thu Apr 26, 2022  0600 Troponin I (High Sensitivity)(!) [CF]  0601 Troponin I (High Sensitivity)(!): 29 The patient's troponin has gone up.  Although it is not up significantly, it has gone up more than 20 points.  I reassessed him and even that he has been sleeping comfortably, he reports that he still feels the pressure in his chest, not as severe as earlier, but he is still symptomatic.   [CF]  0601 Given the patient's known severe coronary artery disease, prior CABG x2 and multiple stents, and no history of similar symptoms, chest pain that occurred at rest and is still ongoing, I believe the patient meets criteria for unstable angina with slightly elevated high-sensitivity troponin level.  Given his very high risk of ACS, I am initiating treatment with heparin bolus plus infusion and will consult the hospitalist for admission.  There is no indication for emergent cardiology consultation at this time, but they should be consulted upon admission.  I discussed all this with the patient.  I also ordered aspirin 324 mg by mouth since he has not had any aspirin since yesterday.  Patient understands and agrees with the plan. [CF]  0602 I will also order nitroglycerin 1 inch on his chest. [CF]  0607 Discussed case by secure chat text with Dr. Sidney Ace with the hospitalist service.  He and his service will consult on the patient and admit to the hospitalist service. [CF]    Clinical Course User Index [CF] Hinda Kehr, MD     FINAL CLINICAL IMPRESSION(S) / ED DIAGNOSES   Final diagnoses:  Unstable angina ([place])  Chest pressure  Elevated troponin level     Rx / DC Orders   ED Discharge Orders     None        Note:  This document was prepared using Dragon voice recognition software and may include unintentional  dictation errors.   Hinda Kehr, MD 04/26/22 [id number]

## 2022-04-26 NOTE — H&P (Signed)
History and Physical    Patient: [name] [name]. JQB:[id number] DOB: 1956-01-28 DOA: 04/26/2022 DOS: the patient was seen and examined on 04/26/2022 PCP: Ranae Plumber, [place]  Patient coming from: Home  Chief Complaint:  Chief Complaint  Patient presents with   Chest Pain   HPI: [name] [name]. is a 66 y.o. male with medical history significant for diabetes mellitus, coronary artery disease status post CABG, status post PCI with stent angioplasty, hypertension, sleep apnea, diverticular disease who presents to the ER via EMS for evaluation of chest pain which he describes as someone sitting on his chest with radiation to both arms.  Chest pain started at rest and was associated with shortness of breath.  He denied having any nausea, no vomiting, no diaphoresis or palpitations.  He had taken a sublingual nitroglycerin without any significant improvement in his symptoms so he called EMS.  EMS advised him to take 4 baby aspirin's which he did. During my evaluation he continues to have chest pressure but is stable. He denies having any fever, no chills, no headache, no abdominal pain, no changes in his bowel habits, no dizziness, no lightheadedness, no blurred vision no focal deficit. Labs show an uptrending troponin Twelve-lead EKG shows no acute ST or T wave changes Patient was started on a heparin drip in the ER    Review of Systems: As mentioned in the history of present illness. All other systems reviewed and are negative. Past Medical History:  Diagnosis Date   Benign esophageal stricture    Cardiomyopathy, secondary ([place])    Coronary artery disease    Diabetes mellitus without complication ([place])    Diverticulitis    GI bleed    Gout    Hemorrhoids    Hyperlipidemia    Hypertension    Sleep apnea    Tubular adenoma of colon    Past Surgical History:  Procedure Laterality Date   cardiac bypass     CARDIAC CATHETERIZATION Left 02/03/2016   Procedure: Left Heart Cath and  Coronary Angiography;  Surgeon: Teodoro Spray, MD;  Location: [place] [facility];  Service: Cardiovascular;  Laterality: Left;   CARDIAC SURGERY     COLONOSCOPY WITH PROPOFOL N/A 04/29/2015   Procedure: COLONOSCOPY WITH PROPOFOL;  Surgeon: Hulen Luster, MD;  Location: [facility] ENDOSCOPY;  Service: Gastroenterology;  Laterality: N/A;   COLONOSCOPY WITH PROPOFOL N/A 07/20/2020   Procedure: COLONOSCOPY WITH PROPOFOL;  Surgeon: Toledo, Benay Pike, MD;  Location: [facility] ENDOSCOPY;  Service: Gastroenterology;  Laterality: N/A;   CORONARY ANGIOPLASTY WITH STENT PLACEMENT     x2   CORONARY ARTERY BYPASS GRAFT     2013 & 2017   ESOPHAGOGASTRODUODENOSCOPY (EGD) WITH PROPOFOL N/A 04/29/2015   Procedure: ESOPHAGOGASTRODUODENOSCOPY (EGD) WITH PROPOFOL;  Surgeon: Hulen Luster, MD;  Location: [facility] ENDOSCOPY;  Service: Gastroenterology;  Laterality: N/A;   LOWER EXTREMITY ANGIOGRAPHY Left 11/20/2021   Procedure: Lower Extremity Angiography;  Surgeon: Algernon Huxley, MD;  Location: [place] [facility];  Service: Cardiovascular;  Laterality: Left;   SEPTOPLASTY     Social History:  reports that he has quit smoking. His smoking use included cigarettes. He has quit using smokeless tobacco. He reports current alcohol use of about 14.0 standard drinks of alcohol per week. He reports that he does not use drugs.  No Known Allergies  Family History  Problem Relation Age of Onset   Heart disease Mother    Hypertension Father     Prior to Admission medications  Medication Sig Start Date End Date Taking? Authorizing Provider  allopurinol (ZYLOPRIM) 100 MG tablet Take 100 mg by mouth daily.  12/25/18   [provider]  aspirin EC 81 MG tablet Take 81 mg by mouth daily. Swallow whole.    [provider]  atorvastatin (LIPITOR) 40 MG tablet Take 40 mg by mouth at bedtime.     [provider]  B-D UF III MINI PEN NEEDLES 31G X 5 MM MISC 50 mm 2 (two) times daily. 4 mm needles 12/30/18   [provider]  BD PEN NEEDLE NANO 2ND GEN 32G X 4 MM MISC Inject 1 applicator into the skin in the morning and at bedtime. 07/18/21   [provider]  Cholecalciferol 25 MCG (1000 UT) tablet Take 2,000 Units by mouth daily.    [provider]  clopidogrel (PLAVIX) 75 MG tablet Take 1 tablet (75 mg total) by mouth daily. 11/20/21   Algernon Huxley, MD  gabapentin (NEURONTIN) 600 MG tablet Take 1 tablet (600 mg total) by mouth 2 (two) times daily. 08/03/20 11/20/21  Milinda Pointer, MD  hydrochlorothiazide (HYDRODIURIL) 25 MG tablet Take 12.5 mg by mouth daily. 01/23/21   [provider]  HYDROcodone-acetaminophen (NORCO/VICODIN) 5-325 MG tablet Take 1 tablet by mouth 2 (two) times daily as needed for severe pain. Must last 30 days 02/24/22 03/26/22  Milinda Pointer, MD  HYDROcodone-acetaminophen (NORCO/VICODIN) 5-325 MG tablet Take 1 tablet by mouth 2 (two) times daily as needed for severe pain. Must last 30 days 03/26/22 04/25/22  Milinda Pointer, MD  HYDROcodone-acetaminophen (NORCO/VICODIN) 5-325 MG tablet Take 1 tablet by mouth 2 (two) times daily as needed for severe pain. Must last 30 days 04/25/22 05/25/22  Milinda Pointer, MD  insulin detemir (LEVEMIR) 100 UNIT/ML FlexPen Inject 30 Units into the skin at bedtime. Patient taking differently: Inject 50 Units into the skin 2 (two) times daily. 50 units in the morning 53 units at bedtime 10/09/20   Loletha Grayer, MD  isosorbide mononitrate (IMDUR) 30 MG 24 hr tablet Take 30 mg by mouth every morning.     [provider]  losartan (COZAAR) 100 MG tablet Take 100 mg by mouth at bedtime. 08/02/16   [provider]  metoprolol tartrate (LOPRESSOR) 50 MG tablet Take 1 tablet (50 mg total) by mouth 2 (two) times daily. Patient taking differently: Take 50 mg by mouth 3 (three) times daily. 10/09/20   Loletha Grayer, MD  Multiple Vitamin (MULTIVITAMIN WITH MINERALS) TABS tablet Take 1 tablet by mouth every  evening.    [provider]  nitroGLYCERIN (NITROSTAT) 0.4 MG SL tablet Place 0.4 mg under the tongue every 5 (five) minutes x 3 doses as needed for chest pain.  03/07/16   [provider]  OZEMPIC, 0.25 OR 0.5 MG/DOSE, 2 MG/1.5ML SOPN Inject 1 mg into the skin once a week. 10/10/20   [provider]  pantoprazole (PROTONIX) 40 MG tablet Take 1 tablet (40 mg total) by mouth daily. 10/09/20 11/20/21  Loletha Grayer, MD  Potassium 99 MG TABS Take 1 tablet by mouth daily.    [provider]    Physical Exam: Vitals:   04/26/22 0700 04/26/22 0715 04/26/22 0730 04/26/22 0800  BP: (!) 160/72  114/67   Pulse: 73 73 72   Resp: '20 20 15   ' Temp:      TempSrc:      SpO2: 94% 98% 99% 98%  Weight:      Height:  Physical Exam Vitals and nursing note reviewed.  Constitutional:      Appearance: He is well-developed.  HENT:     Head: Normocephalic.  Eyes:     Pupils: Pupils are equal, round, and reactive to light.  Cardiovascular:     Rate and Rhythm: Normal rate and regular rhythm.     Heart sounds: Normal heart sounds.  Pulmonary:     Effort: Pulmonary effort is normal.  Abdominal:     General: Bowel sounds are normal.     Palpations: Abdomen is soft.  Musculoskeletal:        General: Normal range of motion.     Cervical back: Normal range of motion and neck supple.  Skin:    General: Skin is warm and dry.  Neurological:     General: No focal deficit present.     Mental Status: He is alert.  Psychiatric:        Mood and Affect: Mood normal.        Behavior: Behavior normal.     Data Reviewed: Relevant notes from primary care and specialist visits, past discharge summaries as available in EHR, including Care Everywhere. Prior diagnostic testing as pertinent to current admission diagnoses Updated medications and problem lists for reconciliation ED course, including vitals, labs, imaging, treatment and response to treatment Triage notes, nursing  and pharmacy notes and ED provider's notes Notable results as noted in HPI Labs reviewed.  PT 14.1, INR 1.1, troponin 7 >>29, sodium 138, potassium 4.1, chloride 105, bicarb 24, glucose 317, BUN 25, creatinine 1.08, calcium 8.8, total protein 7.6, albumin 4.1, AST 52, ALT 48, alk phos 88, total bilirubin 1.0, white count 7.4, hemoglobin 12.6, hematocrit 36.7, MCV 87, RDW 12.7, platelet count 182 Chest x-ray reviewed by me shows no radiographic evidence of acute cardiopulmonary disease. Twelve-lead EKG reviewed by me shows normal sinus rhythm.  Borderline LVH There are no new results to review at this time. Assessment and Plan: Spondylosis without myelopathy or radiculopathy, lumbosacral region Stable Continue chronic opioid therapy  Unstable angina [facility]) Patient with a known history of coronary artery disease status post CABG, status post PCI with stent angioplasty who presents to the ER for evaluation of chest pressure. Twelve-lead EKG shows no acute ST or T wave changes Continue heparin drip initiated in the ER Continue aspirin, Plavix, nitrates, metoprolol and high intensity statin Cycle troponin levels Obtain 2D echocardiogram to assess LVEF and rule out regional wall motion abnormality  Hyperglycemia due to diabetes mellitus ([place]) Patient with a known history of insulin-dependent diabetes mellitus Labs show hyperglycemia Continue long-acting insulin Maintain consistent carbohydrate diet Glycemic control with sliding scale insulin  CKD stage 3 secondary to diabetes [facility]) Patient has stage III chronic kidney disease related to diabetes mellitus Renal function appears stable  S/P CABG (coronary artery bypass graft) Treatment as outlined in 1  Essential hypertension Continue metoprolol, nitrates and losartan  OSA (obstructive sleep apnea) Stable Continue CPAP at bedtime      Advance Care Planning:   Code Status: Full Code   Consults: Cardiology  Family Communication:  Greater than 50% of time was spent discussing patient's condition and plan of care with him at the bedside.  All questions and concerns have been addressed.  He verbalizes understanding and agrees with the plan.  Severity of Illness: The appropriate patient status for this patient is OBSERVATION. Observation status is judged to be reasonable and necessary in order to provide the required intensity of service to ensure the  patient's safety. The patient's presenting symptoms, physical exam findings, and initial radiographic and laboratory data in the context of their medical condition is felt to place them at decreased risk for further clinical deterioration. Furthermore, it is anticipated that the patient will be medically stable for discharge from the hospital within 2 midnights of admission.   Author: Collier Bullock, MD 04/26/2022 9:10 AM  For on call review [email or web address].

## 2022-04-26 NOTE — Consult Note (Signed)
[facility] CONSULT NOTE       Patient ID: [name] [name]. MRN: [id number] DOB/AGE: 1955/12/28 66 y.o.  Admit date: 04/26/2022 Referring Physician Dr. Francine Graven Primary Physician  Primary Cardiologist Nehemiah Massed  Reason for Consultation chest pain  HPI: [name] [name]. Is a 69yoM with a PMH of [name] s/p CABG x4 in 2013 (SVG to RPDA, SVG to diagonal, free LIMA to LAD occluded, SVG to LCx occluded) with redo CABG x1 2017 with free RIMA to LAD at [facility], HFpEF (LVEF >55%, G1 DD 03/07/2022, mild-moderate aortic stenosis, PAD s/p kissing stent placements to bilateral common iliac arteries 11/20/2021, hypertension, hyperlipidemia, history of esophageal stricture who presented to [facility] ED 04/26/2022 with chest pain.  Cardiology is consulted for further assistance.  [name] [name] was in his usual state of health yesterday, renewed his handicap license plate tag, ran errands, saw a friend yesterday.  Ate dinner like usual (soup and hot dogs) and [name] [name] was sitting in his recliner watching TV when [name] [name] suddenly experienced 10 out of 10 "chest pressure" [name] [name] describes as "something sitting on his chest".  [name] [name] took 1 nitroglycerin without relief, took 325 mg aspirin at the direction of EMS and presented to the ED.  [name] [name] denies any associated nausea, diaphoresis, shortness of breath, or radiation of his chest pain.  The pain is nonexertional, positional, or relieved by anything in particular.  The nitroglycerin did not help his discomfort.  [name] [name] continues to have this pain during interview [name] [name] states is "all across his chest" and [name] [name] thinks it is a little bit better now (8/10) and [name] [name] even tells me "[name] [name] feels relaxed", laying flat in the ED stretcher watching TV.  [name] [name] notes [name] [name] occasionally has a dull chest pain but this feels somewhat different than that.  [name] [name] has never had a pain that lasted this long before in his chest.  Denies pain with swallowing, or burning in his chest.  [name] [name] otherwise denies palpitations, orthopnea, or  peripheral edema.  [name] [name] has chronic pain in his lower legs that is no worse than usual.  [name] [name] does not do structured exercise, does not walk much at all due to his chronic pain.  Vitals are notable for a blood pressure of 189/90, heart rate 80 in sinus rhythm on telemetry.  SPO2 100% on room air.  Labs are notable for a potassium of 4.1, blood glucose 317, BUN/creatinine 25/1.08 and GFR greater than 60.  High-sensitivity troponin minimally elevated at 04-01-27.  H&H with slight anemia with hemoglobin 12.6/36.7, platelets 182. Chest x-ray negative for active cardiopulmonary disease.  Review of systems complete and found to be negative unless listed above     Past Medical History:  Diagnosis Date   Benign esophageal stricture    Cardiomyopathy, secondary ([place])    Coronary artery disease    Diabetes mellitus without complication ([place])    Diverticulitis    GI bleed    Gout    Hemorrhoids    Hyperlipidemia    Hypertension    Sleep apnea    Tubular adenoma of colon     Past Surgical History:  Procedure Laterality Date   cardiac bypass     CARDIAC CATHETERIZATION Left 02/03/2016   Procedure: Left Heart Cath and Coronary Angiography;  Surgeon: Teodoro Spray, MD;  Location: [facility];  Service: Cardiovascular;  Laterality: Left;   CARDIAC SURGERY     COLONOSCOPY WITH PROPOFOL N/A 04/29/2015   Procedure: COLONOSCOPY WITH PROPOFOL;  Surgeon: Hulen Luster,  MD;  Location: [facility] ENDOSCOPY;  Service: Gastroenterology;  Laterality: N/A;   COLONOSCOPY WITH PROPOFOL N/A 07/20/2020   Procedure: COLONOSCOPY WITH PROPOFOL;  Surgeon: Toledo, Benay Pike, MD;  Location: [facility] ENDOSCOPY;  Service: Gastroenterology;  Laterality: N/A;   CORONARY ANGIOPLASTY WITH STENT PLACEMENT     x2   CORONARY ARTERY BYPASS GRAFT     2013 & 2017   ESOPHAGOGASTRODUODENOSCOPY (EGD) WITH PROPOFOL N/A 04/29/2015   Procedure: ESOPHAGOGASTRODUODENOSCOPY (EGD) WITH PROPOFOL;  Surgeon: Hulen Luster, MD;  Location: [facility] ENDOSCOPY;   Service: Gastroenterology;  Laterality: N/A;   LOWER EXTREMITY ANGIOGRAPHY Left 11/20/2021   Procedure: Lower Extremity Angiography;  Surgeon: Algernon Huxley, MD;  Location: [facility];  Service: Cardiovascular;  Laterality: Left;   SEPTOPLASTY      (Not in a hospital admission)  Social History   Socioeconomic History   Marital status: Widowed    Spouse name: Not on file   Number of children: Not on file   Years of education: Not on file   Highest education level: Not on file  Occupational History   Occupation: retired  Tobacco Use   Smoking status: Former    Years: 25.00    Types: Cigarettes   Smokeless tobacco: Former  Substance and Sexual Activity   Alcohol use: Yes    Alcohol/week: 14.0 standard drinks of alcohol    Types: 14 Shots of liquor per week    Comment: couple drinks daily   Drug use: No   Sexual activity: Not on file  Other Topics Concern   Not on file  Social History Narrative   ED visit around the end of October to have chest pain checked which was benign   Social Determinants of Radio broadcast assistant Strain: Not on file  Food Insecurity: Not on file  Transportation Needs: Not on file  Physical Activity: Not on file  Stress: Not on file  Social Connections: Not on file  Intimate Partner Violence: Not on file    Family History  Problem Relation Age of Onset   Heart disease Mother    Hypertension Father       PHYSICAL EXAM General: Elderly Caucasian male, lying flat in ED stretcher watching TV.  Conversational and comfortable appearing. HEENT:  Normocephalic and atraumatic. Neck:  No JVD.  Lungs: Normal respiratory effort on room air. Clear bilaterally to auscultation. No wheezes, crackles, rhonchi.  Chest: Diffuse tenderness to palpation across his anterior chest wall.  Worse at all along his well-healed sternotomy scar, without apparent wound dehiscence, significant erythema, or apparent infection.   Heart: HRRR . Normal S1 and S2  .  Soft 2/6 systolic murmur heard throughout. Abdomen: Soft, nontender, obese appearing.  Msk: Normal strength and tone for age. Extremities: Warm and well perfused. No clubbing, cyanosis.  No peripheral edema.  Neuro: Alert and oriented X 3. Psych:  Answers questions appropriately.   Labs: Basic Metabolic Panel: Recent Labs    04/25/22 2304  NA 138  K 4.1  CL 105  CO2 24  GLUCOSE 317*  BUN 25*  CREATININE 1.08  CALCIUM 8.8*   Liver Function Tests: Recent Labs    04/25/22 2304  AST 52*  ALT 48*  ALKPHOS 88  BILITOT 1.0  PROT 7.6  ALBUMIN 4.1   No results for input(s): "LIPASE", "AMYLASE" in the last 72 hours. CBC: Recent Labs    04/25/22 2304  WBC 7.4  HGB 12.6*  HCT 36.7*  MCV 87.0  PLT 182  Cardiac Enzymes: Recent Labs    04/25/22 2304 04/26/22 0418 04/26/22 0946  TROPONINIHS 7 29* 28*   BNP: Invalid input(s): "POCBNP" D-Dimer: No results for input(s): "DDIMER" in the last 72 hours. Hemoglobin A1C: No results for input(s): "HGBA1C" in the last 72 hours. Fasting Lipid Panel: No results for input(s): "CHOL", "HDL", "LDLCALC", "TRIG", "CHOLHDL", "LDLDIRECT" in the last 72 hours. Thyroid Function Tests: No results for input(s): "TSH", "T4TOTAL", "T3FREE", "THYROIDAB" in the last 72 hours.  Invalid input(s): "FREET3" Anemia Panel: No results for input(s): "VITAMINB12", "FOLATE", "FERRITIN", "TIBC", "IRON", "RETICCTPCT" in the last 72 hours.  DG Chest 2 View  Result Date: 04/25/2022 CLINICAL DATA:  Chest pain EXAM: CHEST - 2 VIEW COMPARISON:  07/16/2021 FINDINGS: Lungs are clear. No pneumothorax or pleural effusion. Coronary artery bypass grafting has been performed. Cardiac size within normal limits. Pulmonary vascularity is normal. No acute bone abnormality. IMPRESSION: No radiographic evidence of acute cardiopulmonary disease. Electronically Signed   By: Fidela Salisbury M.D.   On: 04/25/2022 23:30     Radiology: DG Chest 2 View  Result Date:  04/25/2022 CLINICAL DATA:  Chest pain EXAM: CHEST - 2 VIEW COMPARISON:  07/16/2021 FINDINGS: Lungs are clear. No pneumothorax or pleural effusion. Coronary artery bypass grafting has been performed. Cardiac size within normal limits. Pulmonary vascularity is normal. No acute bone abnormality. IMPRESSION: No radiographic evidence of acute cardiopulmonary disease. Electronically Signed   By: Fidela Salisbury M.D.   On: 04/25/2022 23:30    ECHO 03/07/2022 ECHOCARDIOGRAPHIC MEASUREMENTS  2D DIMENSIONS  AORTA                  Values   Normal Range   MAIN PA         Values    Normal Range                Annulus: nm*          [2.3-2.9]         PA Main: 3.0 cm    [1.5-2.1]              Aorta Sin: 2.7 cm       [3.1-3.7]    RIGHT VENTRICLE            ST Junction: nm*          [2.6-3.2]         RV Base: 3.3 cm    [<4.2]              Asc.Aorta: 2.7 cm       [2.6-3.4]          RV Mid: 2.6 cm    [<3.5]  LEFT VENTRICLE                                      RV Length: nm*       [<8.6]                  LVIDd: 4.1 cm       [4.2-5.9]    INFERIOR VENA CAVA                  LVIDs: 1.7 cm                        Max. IVC: 1.0 cm    [<=2.1]  FS: 59.3 %       [>25]            Min. IVC: nm*                    SWT: 0.95 cm      [0.6-1.0]    ------------------                    PWT: 1.2 cm       [0.6-1.0]    nm* - not measured  LEFT ATRIUM                LA Diam: 3.5 cm       [3.0-4.0]            LA A4C Area: 17.3 cm2     [<20]              LA Volume: 49.1 ml      [18-58]  _________________________________________________________________________________________  ECHOCARDIOGRAPHIC DESCRIPTIONS  AORTIC ROOT                   Size: Normal             Dissection: INDETERM FOR DISSECTION  AORTIC VALVE               Leaflets: Tricuspid                   Morphology: MODERATELY THICKENED               Mobility: PARTIALLY MOBILE  LEFT VENTRICLE                   Size: Normal                        Anterior:  Normal            Contraction: Normal                         Lateral: Normal             Closest EF: >55% (Estimated)                Septal: Normal              LV Masses: No Masses                       Apical: Normal                    LVH: MILD LVH ASYMMETRIC           Inferior: Normal                                                      Posterior: Normal           Dias.FxClass: (Grade 1) relaxation abnormal, E/A reversal  MITRAL VALVE               Leaflets: Normal                        Mobility: Fully mobile             Morphology: THICKENED LEAFLET(S)  MV Note: MITRAL ANNULAR CALCIFICATION  LEFT ATRIUM                   Size: Normal                       LA Masses: No masses              IA Septum: Normal IAS  MAIN PA                   Size: MODERATELY DILATED  PULMONIC VALVE             Morphology: Normal                        Mobility: Fully mobile  RIGHT VENTRICLE              RV Masses: No Masses                         Size: MILDLY ENLARGED              Free Wall: Normal                     Contraction: Normal  TRICUSPID VALVE               Leaflets: Normal                        Mobility: Fully mobile             Morphology: Normal  RIGHT ATRIUM                   Size: Normal                        RA Other: None                RA Mass: No masses  PERICARDIUM                  Fluid: No effusion  INFERIOR VENACAVA                   Size: Normal Normal respiratory collapse  _________________________________________________________________________________________   DOPPLER ECHO and OTHER SPECIAL PROCEDURES                 Aortic: TRIVIAL AR                 MILD AS                         327.0 cm/sec peak vel      42.8 mmHg peak grad                         18.6 mmHg mean grad        1.0 cm^2 by DOPPLER                 Mitral: MILD MR                    No MS                         MV Inflow E Vel = 111.0 cm/sec      MV Annulus E'Vel =  9.0 cm/sec                          E/E'Ratio = 12.3              Tricuspid: MILD TR                    No TS                         268.0 cm/sec peak TR vel   31.7 mmHg peak RV pressure              Pulmonary: TRIVIAL PR                 No PS                         128.0 cm/sec peak vel      6.6 mmHg peak grad  _________________________________________________________________________________________  INTERPRETATION  NORMAL LEFT VENTRICULAR SYSTOLIC FUNCTION   WITH MILD LVH  NORMAL RIGHT VENTRICULAR SYSTOLIC FUNCTION  MILD VALVULAR REGURGITATION (See above)  MILD VALVULAR STENOSIS (See above)  ESTIMATED LVEF >55%  CALCULATED: 64.5%  GLS: -18.5%  MILD to MODERATEAS: MAX VEL. 3.59ms; AVA: 1.0cm^2  (PREV. ECHO: 2.281m; AVA: 1.48cm^2)  MODERATELY DILATED MAIN PULMONARY ARTERY MEASURING 3.0cm  _________________________________________________________________________________________  Electronically signed by         RyDonnelly AngelicaMD on 03/09/2022 02: 01 PM           Performed By: HaDerinda Late   Ordering Physician: ORDonnelly Angelica 02/03/2016 [facility] Dr FaUbaldo Glassingre-redo CABG  Mid RCA lesion, 70% stenosed. SVG was injected is moderate in size. The graft exhibits minimal luminal irregularities. Prox RCA lesion, 75% stenosed. SVG . SVG . Prox Graft lesion, 100% stenosed. was not visualized . Free LIMA to LAD Prox Graft lesion, 100% stenosed. LM lesion, 80% stenosed. Prox Cx lesion, 65% stenosed.   Will attempt medical manangement. Consideration for redo cabg coulg be raised if medical manangemnt is not adequate.   TELEMETRY reviewed by me (LT) 04/26/2022 : Sinus rhythm rate 70s to 80s  EKG reviewed by me: NSR RBBB rate 81, overall similar to prior from 2022  Data reviewed by me (LT) 04/26/2022: ED doctor note, admission H&P, EKGs, office note from past 2 cardiology visits outpatient, CBC, BMP, troponin, chest x-ray  ASSESSMENT AND PLAN:  [name] [name] s/p CABG x4 in 2013  (SVG to RPDA, SVG to diagonal, free LIMA to LAD occluded, SVG to LCx occluded) with redo CABG x1 2017 [facility] (LVEF >55%, G1 DD 03/07/2022, mild-moderate aortic stenosis, PAD s/p kissing stent placements to bilateral common iliac arteries 11/20/2021, hypertension, hyperlipidemia, history of esophageal stricture [facility].  Cardiology is consulted for further assistance.  #Atypical chest pain #[name] s/p CABG x4 2014 & redo CABG x1 2017 Presents with acute onset of 10 out of 10 "chest pressure" that has been constant since 10 PM on 04/2022.  Not associated with nausea, shortness of breath, radiation, diaphoresis, or worsened with exertion or anything in particular.  [name] [name] has had "dull chest pain" intermittently, but nothing that lasted this long before.  [name] [name] has reproducible chest pain with palpation of his sternotomy scar and across his anterior chest wall, upon  reevaluation this afternoon [name] [name] tells me [name] [name] also had some chest tightness that was not reproducible to palpation as well.  [name] [name] is clinically well-appearing and hemodynamically stable, other than fairly hypertensive with SBP 190 during interview.  Troponins minimally elevated and flat trending at 04-01-27.  His pain somewhat improved with IM Toradol x1 but not with SL nitroglycerin. -S/p 325 mg aspirin, continue 81 mg aspirin and 75 mg clopidogrel daily (s/p iliac artery stents 11/2021) -Stop heparin drip -Continue atorvastatin 40 mg daily -Continue metoprolol 50 mg 3 times daily -Give losartan 50 mg this morning as [name] [name] is fairly hypertensive, discontinue evening dose -Continue Imdur 30 mg once daily -Suspect his chest pain is noncardiac in nature due to its reproducibility, but with his significant cardiac history can order a Lexiscan Myoview to rule out ischemia.  The patient prefers to have this done tomorrow morning rather than wait and perform on an outpatient basis.  We will make him  n.p.o. at midnight and plan for this tomorrow morning.  #Mild to moderate aortic stenosis #Chronic HFpEF (LVEF greater than 55% 03/07/2022) Appears compensated from a heart failure standpoint, euvolemic on exam.  Continue GDMT as above with metoprolol and losartan.  This patient's plan of care was discussed and created with Dr. Saralyn Pilar and [name] [name] is in agreement.  Signed: Tristan Schroeder , PA-C 04/26/2022, 10:56 AM [facility]

## 2022-04-26 NOTE — Assessment & Plan Note (Signed)
Stable ?Continue CPAP at bedtime ?

## 2022-04-27 ENCOUNTER — Encounter: Payer: Self-pay | Admitting: Family Medicine

## 2022-04-27 ENCOUNTER — Observation Stay: Payer: Medicare Other

## 2022-04-27 DIAGNOSIS — I10 Essential (primary) hypertension: Secondary | ICD-10-CM | POA: Diagnosis not present

## 2022-04-27 DIAGNOSIS — R7401 Elevation of levels of liver transaminase levels: Secondary | ICD-10-CM | POA: Diagnosis not present

## 2022-04-27 DIAGNOSIS — M47816 Spondylosis without myelopathy or radiculopathy, lumbar region: Secondary | ICD-10-CM | POA: Diagnosis not present

## 2022-04-27 DIAGNOSIS — I200 Unstable angina: Secondary | ICD-10-CM | POA: Diagnosis not present

## 2022-04-27 LAB — NM MYOCAR MULTI W/SPECT W/WALL MOTION / EF
Base ST Depression (mm): 0 mm
Estimated workload: 1
Exercise duration (min): 1 min
Exercise duration (sec): 14 s
LV dias vol: 63 mL (ref 62–150)
LV sys vol: 29 mL
MPHR: 152 {beats}/min
Nuc Stress EF: 54 %
Peak HR: 94 {beats}/min
Percent HR: 61 %
Rest HR: 86 {beats}/min
Rest Nuclear Isotope Dose: 10.2 mCi
SDS: 4
SRS: 0
SSS: 4
ST Depression (mm): 0 mm
Stress Nuclear Isotope Dose: 31.1 mCi
TID: 1.1

## 2022-04-27 LAB — CBC
HCT: 35.3 % — ABNORMAL LOW (ref 39.0–52.0)
Hemoglobin: 12.4 g/dL — ABNORMAL LOW (ref 13.0–17.0)
MCH: 29.7 pg (ref 26.0–34.0)
MCHC: 35.1 g/dL (ref 30.0–36.0)
MCV: 84.4 fL (ref 80.0–100.0)
Platelets: 173 10*3/uL (ref 150–400)
RBC: 4.18 MIL/uL — ABNORMAL LOW (ref 4.22–5.81)
RDW: 12.5 % (ref 11.5–15.5)
WBC: 6.9 10*3/uL (ref 4.0–10.5)
nRBC: 0 % (ref 0.0–0.2)

## 2022-04-27 LAB — LIPID PANEL
Cholesterol: 82 mg/dL (ref 0–200)
HDL: 26 mg/dL — ABNORMAL LOW (ref >40)
LDL Cholesterol: 26 mg/dL (ref 0–99)
Total CHOL/HDL Ratio: 3.2 RATIO
Triglycerides: 151 mg/dL — ABNORMAL HIGH (ref <150)
VLDL: 30 mg/dL (ref 0–40)

## 2022-04-27 LAB — BASIC METABOLIC PANEL
Anion gap: 7 (ref 5–15)
BUN: 25 mg/dL — ABNORMAL HIGH (ref 8–23)
CO2: 26 mmol/L (ref 22–32)
Calcium: 9.2 mg/dL (ref 8.9–10.3)
Chloride: 107 mmol/L (ref 98–111)
Creatinine, Ser: 1.02 mg/dL (ref 0.61–1.24)
GFR, Estimated: >60 mL/min (ref >60)
Glucose, Bld: 126 mg/dL — ABNORMAL HIGH (ref 70–99)
Potassium: 3.7 mmol/L (ref 3.5–5.1)
Sodium: 140 mmol/L (ref 135–145)

## 2022-04-27 LAB — GLUCOSE, CAPILLARY
Glucose-Capillary: 102 mg/dL — ABNORMAL HIGH (ref 70–99)
Glucose-Capillary: 102 mg/dL — ABNORMAL HIGH (ref 70–99)

## 2022-04-27 MED ORDER — HYDRALAZINE HCL 20 MG/ML IJ SOLN
20.0000 mg | Freq: Four times a day (QID) | INTRAMUSCULAR | Status: DC | PRN
  Administered 2022-04-27: 20 mg via INTRAVENOUS
  Filled 2022-04-27: qty 1

## 2022-04-27 MED ORDER — REGADENOSON 0.4 MG/5ML IV SOLN
0.4000 mg | Freq: Once | INTRAVENOUS | Status: AC
  Administered 2022-04-27: 0.4 mg via INTRAVENOUS

## 2022-04-27 MED ORDER — LOSARTAN POTASSIUM 50 MG PO TABS: 100.0000 mg | ORAL_TABLET | Freq: Every day | ORAL | Status: DC

## 2022-04-27 MED ORDER — LOSARTAN POTASSIUM 100 MG PO TABS: 100.0000 mg | ORAL_TABLET | Freq: Every day | ORAL | 0 refills | Status: DC

## 2022-04-27 MED ORDER — TECHNETIUM TC 99M TETROFOSMIN IV KIT
10.0000 | PACK | Freq: Once | INTRAVENOUS | Status: AC
  Administered 2022-04-27: 10.22 via INTRAVENOUS

## 2022-04-27 MED ORDER — TECHNETIUM TC 99M TETROFOSMIN IV KIT
31.0800 | PACK | Freq: Once | INTRAVENOUS | Status: AC | PRN
  Administered 2022-04-27: 31.08 via INTRAVENOUS

## 2022-04-27 NOTE — TOC CM/SW Note (Signed)
Patient has orders to discharge home today. Chart reviewed. PCP is Ranae Plumber, [place]. On room air. No wounds. No TOC needs identified. CSW signing off.  Dayton Scrape, [place]

## 2022-04-27 NOTE — Progress Notes (Signed)
[facility] CONSULT NOTE       Patient ID: [name] [name]. MRN: [id number] DOB/AGE: 1955-10-28 66 y.o.  Admit date: 04/26/2022 Referring Physician Dr. Francine Graven Primary Physician  Primary Cardiologist Nehemiah Massed  Reason for Consultation chest pain  HPI: [name] [name]. Is a 81yoM with a PMH of CAD s/p CABG x4 in 2013 (SVG to RPDA, SVG to diagonal, free LIMA to LAD occluded, SVG to LCx occluded) with redo CABG x1 2017 with free RIMA to LAD at [facility], HFpEF (LVEF >55%, G1 DD 03/07/2022, mild-moderate aortic stenosis, PAD s/p kissing stent placements to bilateral common iliac arteries 11/20/2021, hypertension, hyperlipidemia, history of esophageal stricture who presented to [facility] ED 04/26/2022 with chest pain.  Cardiology is consulted for further assistance.  Interval History: -Feels better today, his chest tightness improved from yesterday, still somewhat tender to palpation along his sternotomy scar -Notes he "feels more like himself today", no shortness of breath or other complaints -Lexiscan Myoview scheduled for this morning  Review of systems complete and found to be negative unless listed above     Past Medical History:  Diagnosis Date   Benign esophageal stricture    Cardiomyopathy, secondary ([place])    Coronary artery disease    Diabetes mellitus without complication ([place])    Diverticulitis    GI bleed    Gout    Hemorrhoids    Hyperlipidemia    Hypertension    Sleep apnea    Tubular adenoma of colon     Past Surgical History:  Procedure Laterality Date   cardiac bypass     CARDIAC CATHETERIZATION Left 02/03/2016   Procedure: Left Heart Cath and Coronary Angiography;  Surgeon: Teodoro Spray, MD;  Location: [facility];  Service: Cardiovascular;  Laterality: Left;   CARDIAC SURGERY     COLONOSCOPY WITH PROPOFOL N/A 04/29/2015   Procedure: COLONOSCOPY WITH PROPOFOL;  Surgeon: Hulen Luster, MD;  Location: [facility] ENDOSCOPY;  Service: Gastroenterology;   Laterality: N/A;   COLONOSCOPY WITH PROPOFOL N/A 07/20/2020   Procedure: COLONOSCOPY WITH PROPOFOL;  Surgeon: Toledo, Benay Pike, MD;  Location: [facility] ENDOSCOPY;  Service: Gastroenterology;  Laterality: N/A;   CORONARY ANGIOPLASTY WITH STENT PLACEMENT     x2   CORONARY ARTERY BYPASS GRAFT     2013 & 2017   ESOPHAGOGASTRODUODENOSCOPY (EGD) WITH PROPOFOL N/A 04/29/2015   Procedure: ESOPHAGOGASTRODUODENOSCOPY (EGD) WITH PROPOFOL;  Surgeon: Hulen Luster, MD;  Location: [facility] ENDOSCOPY;  Service: Gastroenterology;  Laterality: N/A;   LOWER EXTREMITY ANGIOGRAPHY Left 11/20/2021   Procedure: Lower Extremity Angiography;  Surgeon: Algernon Huxley, MD;  Location: [facility];  Service: Cardiovascular;  Laterality: Left;   SEPTOPLASTY      Medications Prior to Admission  Medication Sig Dispense Refill Last Dose   allopurinol (ZYLOPRIM) 100 MG tablet Take 100 mg by mouth daily.    04/25/2022   aspirin EC 81 MG tablet Take 81 mg by mouth daily. Swallow whole.   04/25/2022   Cholecalciferol 25 MCG (1000 UT) tablet Take 2,000 Units by mouth daily.   04/25/2022   clopidogrel (PLAVIX) 75 MG tablet Take 1 tablet (75 mg total) by mouth daily. 30 tablet 11 04/25/2022   gabapentin (NEURONTIN) 600 MG tablet Take 1 tablet (600 mg total) by mouth 2 (two) times daily. 60 tablet 2 04/25/2022   hydrochlorothiazide (HYDRODIURIL) 25 MG tablet Take 12.5 mg by mouth daily.   04/25/2022   HYDROcodone-acetaminophen (NORCO/VICODIN) 5-325 MG tablet Take 1 tablet by  mouth 2 (two) times daily as needed for severe pain. Must last 30 days 60 tablet 0 04/25/2022   insulin detemir (LEVEMIR) 100 UNIT/ML FlexPen Inject 30 Units into the skin at bedtime. (Patient taking differently: Inject 50 Units into the skin 2 (two) times daily. 50 units in the morning 52 units at bedtime) 15 mL  04/25/2022   isosorbide mononitrate (IMDUR) 30 MG 24 hr tablet Take 30 mg by mouth every morning.   1 04/25/2022   metoprolol tartrate (LOPRESSOR) 50 MG tablet Take  1 tablet (50 mg total) by mouth 2 (two) times daily. (Patient taking differently: Take 50 mg by mouth 3 (three) times daily.)   04/25/2022   Multiple Vitamin (MULTIVITAMIN WITH MINERALS) TABS tablet Take 1 tablet by mouth every evening.   04/25/2022   OZEMPIC, 1 MG/DOSE, 4 MG/3ML SOPN Inject 1 mg into the skin once a week.   04/25/2022   Potassium 99 MG TABS Take 1 tablet by mouth daily.   04/25/2022   atorvastatin (LIPITOR) 40 MG tablet Take 40 mg by mouth at bedtime.    04/24/2022   B-D UF III MINI PEN NEEDLES 31G X 5 MM MISC 50 mm 2 (two) times daily. 4 mm needles      BD PEN NEEDLE NANO 2ND GEN 32G X 4 MM MISC Inject 1 applicator into the skin in the morning and at bedtime.      HYDROcodone-acetaminophen (NORCO/VICODIN) 5-325 MG tablet Take 1 tablet by mouth 2 (two) times daily as needed for severe pain. Must last 30 days 60 tablet 0    HYDROcodone-acetaminophen (NORCO/VICODIN) 5-325 MG tablet Take 1 tablet by mouth 2 (two) times daily as needed for severe pain. Must last 30 days 60 tablet 0    nitroGLYCERIN (NITROSTAT) 0.4 MG SL tablet Place 0.4 mg under the tongue every 5 (five) minutes x 3 doses as needed for chest pain.    prn at prn   pantoprazole (PROTONIX) 40 MG tablet Take 1 tablet (40 mg total) by mouth daily. 30 tablet 0 prn at prn    Social History   Socioeconomic History   Marital status: Widowed    Spouse name: Not on file   Number of children: Not on file   Years of education: Not on file   Highest education level: Not on file  Occupational History   Occupation: retired  Tobacco Use   Smoking status: Former    Years: 25.00    Types: Cigarettes   Smokeless tobacco: Former  Substance and Sexual Activity   Alcohol use: Yes    Alcohol/week: 14.0 standard drinks of alcohol    Types: 14 Shots of liquor per week    Comment: couple drinks daily   Drug use: No   Sexual activity: Not on file  Other Topics Concern   Not on file  Social History Narrative   ED visit around the  end of October to have chest pain checked which was benign   Social Determinants of Radio broadcast assistant Strain: Not on file  Food Insecurity: Not on file  Transportation Needs: Not on file  Physical Activity: Not on file  Stress: Not on file  Social Connections: Not on file  Intimate Partner Violence: Not on file    Family History  Problem Relation Age of Onset   Heart disease Mother    Hypertension Father       PHYSICAL EXAM General: Elderly Caucasian male, laying flat in PCU bed conversational and comfortable  appearing. HEENT:  Normocephalic and atraumatic. Neck:  No JVD.  Lungs: Normal respiratory effort on room air. Clear bilaterally to auscultation. No wheezes, crackles, rhonchi.  Chest: Less tenderness palpation along anterior chest wall.  Still tender to palpation along his median sternotomy scar.   Heart: HRRR . Normal S1 and S2 .  Soft 2/6 systolic murmur heard throughout. Abdomen: obese appearing.  Msk: Normal strength and tone for age. Extremities: Warm and well perfused. No clubbing, cyanosis.  No peripheral edema.  Neuro: Alert and oriented X 3. Psych:  Answers questions appropriately.   Labs: Basic Metabolic Panel: Recent Labs    04/25/22 2304 04/27/22 0442  NA 138 140  K 4.1 3.7  CL 105 107  CO2 24 26  GLUCOSE 317* 126*  BUN 25* 25*  CREATININE 1.08 1.02  CALCIUM 8.8* 9.2   Liver Function Tests: Recent Labs    04/25/22 2304  AST 52*  ALT 48*  ALKPHOS 88  BILITOT 1.0  PROT 7.6  ALBUMIN 4.1   No results for input(s): "LIPASE", "AMYLASE" in the last 72 hours. CBC: Recent Labs    04/25/22 2304 04/27/22 0442  WBC 7.4 6.9  HGB 12.6* 12.4*  HCT 36.7* 35.3*  MCV 87.0 84.4  PLT 182 173   Cardiac Enzymes: Recent Labs    04/26/22 0418 04/26/22 0946 04/26/22 1911  TROPONINIHS 29* 28* 21*   BNP: Invalid input(s): "POCBNP" D-Dimer: No results for input(s): "DDIMER" in the last 72 hours. Hemoglobin A1C: Recent Labs     04/26/22 0946  HGBA1C 6.9*   Fasting Lipid Panel: Recent Labs    04/27/22 0442  CHOL 82  HDL 26*  LDLCALC 26  TRIG 151*  CHOLHDL 3.2   Thyroid Function Tests: No results for input(s): "TSH", "T4TOTAL", "T3FREE", "THYROIDAB" in the last 72 hours.  Invalid input(s): "FREET3" Anemia Panel: No results for input(s): "VITAMINB12", "FOLATE", "FERRITIN", "TIBC", "IRON", "RETICCTPCT" in the last 72 hours.  DG Chest 2 View  Result Date: 04/25/2022 CLINICAL DATA:  Chest pain EXAM: CHEST - 2 VIEW COMPARISON:  07/16/2021 FINDINGS: Lungs are clear. No pneumothorax or pleural effusion. Coronary artery bypass grafting has been performed. Cardiac size within normal limits. Pulmonary vascularity is normal. No acute bone abnormality. IMPRESSION: No radiographic evidence of acute cardiopulmonary disease. Electronically Signed   By: Fidela Salisbury M.D.   On: 04/25/2022 23:30     Radiology: DG Chest 2 View  Result Date: 04/25/2022 CLINICAL DATA:  Chest pain EXAM: CHEST - 2 VIEW COMPARISON:  07/16/2021 FINDINGS: Lungs are clear. No pneumothorax or pleural effusion. Coronary artery bypass grafting has been performed. Cardiac size within normal limits. Pulmonary vascularity is normal. No acute bone abnormality. IMPRESSION: No radiographic evidence of acute cardiopulmonary disease. Electronically Signed   By: Fidela Salisbury M.D.   On: 04/25/2022 23:30    ECHO 03/07/2022 ECHOCARDIOGRAPHIC MEASUREMENTS  2D DIMENSIONS  AORTA                  Values   Normal Range   MAIN PA         Values    Normal Range                Annulus: nm*          [2.3-2.9]         PA Main: 3.0 cm    [1.5-2.1]              Aorta Sin: 2.7 cm       [  3.1-3.7]    RIGHT VENTRICLE            ST Junction: nm*          [2.6-3.2]         RV Base: 3.3 cm    [<4.2]              Asc.Aorta: 2.7 cm       [2.6-3.4]          RV Mid: 2.6 cm    [<3.5]  LEFT VENTRICLE                                      RV Length: nm*       [<8.6]                   LVIDd: 4.1 cm       [4.2-5.9]    INFERIOR VENA CAVA                  LVIDs: 1.7 cm                        Max. IVC: 1.0 cm    [<=2.1]                     FS: 59.3 %       [>25]            Min. IVC: nm*                    SWT: 0.95 cm      [0.6-1.0]    ------------------                    PWT: 1.2 cm       [0.6-1.0]    nm* - not measured  LEFT ATRIUM                LA Diam: 3.5 cm       [3.0-4.0]            LA A4C Area: 17.3 cm2     [<20]              LA Volume: 49.1 ml      [18-58]  _________________________________________________________________________________________  ECHOCARDIOGRAPHIC DESCRIPTIONS  AORTIC ROOT                   Size: Normal             Dissection: INDETERM FOR DISSECTION  AORTIC VALVE               Leaflets: Tricuspid                   Morphology: MODERATELY THICKENED               Mobility: PARTIALLY MOBILE  LEFT VENTRICLE                   Size: Normal                        Anterior: Normal            Contraction: Normal                         Lateral: Normal  Closest EF: >55% (Estimated)                Septal: Normal              LV Masses: No Masses                       Apical: Normal                    LVH: MILD LVH ASYMMETRIC           Inferior: Normal                                                      Posterior: Normal           Dias.FxClass: (Grade 1) relaxation abnormal, E/A reversal  MITRAL VALVE               Leaflets: Normal                        Mobility: Fully mobile             Morphology: THICKENED LEAFLET(S)                MV Note: MITRAL ANNULAR CALCIFICATION  LEFT ATRIUM                   Size: Normal                       LA Masses: No masses              IA Septum: Normal IAS  MAIN PA                   Size: MODERATELY DILATED  PULMONIC VALVE             Morphology: Normal                        Mobility: Fully mobile  RIGHT VENTRICLE              RV Masses: No Masses                         Size: MILDLY ENLARGED               Free Wall: Normal                     Contraction: Normal  TRICUSPID VALVE               Leaflets: Normal                        Mobility: Fully mobile             Morphology: Normal  RIGHT ATRIUM                   Size: Normal                        RA Other: None                RA Mass: No masses  PERICARDIUM  Fluid: No effusion  INFERIOR VENACAVA                   Size: Normal Normal respiratory collapse  _________________________________________________________________________________________   DOPPLER ECHO and OTHER SPECIAL PROCEDURES                 Aortic: TRIVIAL AR                 MILD AS                         327.0 cm/sec peak vel      42.8 mmHg peak grad                         18.6 mmHg mean grad        1.0 cm^2 by DOPPLER                 Mitral: MILD MR                    No MS                         MV Inflow E Vel = 111.0 cm/sec      MV Annulus E'Vel = 9.0 cm/sec                         E/E'Ratio = 12.3              Tricuspid: MILD TR                    No TS                         268.0 cm/sec peak TR vel   31.7 mmHg peak RV pressure              Pulmonary: TRIVIAL PR                 No PS                         128.0 cm/sec peak vel      6.6 mmHg peak grad  _________________________________________________________________________________________  INTERPRETATION  NORMAL LEFT VENTRICULAR SYSTOLIC FUNCTION   WITH MILD LVH  NORMAL RIGHT VENTRICULAR SYSTOLIC FUNCTION  MILD VALVULAR REGURGITATION (See above)  MILD VALVULAR STENOSIS (See above)  ESTIMATED LVEF >55%  CALCULATED: 64.5%  GLS: -18.5%  MILD to MODERATEAS: MAX VEL. 3.64ms; AVA: 1.0cm^2  (PREV. ECHO: 2.283m; AVA: 1.48cm^2)  MODERATELY DILATED MAIN PULMONARY ARTERY MEASURING 3.0cm  _________________________________________________________________________________________  Electronically signed by         RyDonnelly AngelicaMD on 03/09/2022 02: 01 PM           Performed By: HaDerinda Late    Ordering Physician: ORDonnelly Angelica 02/03/2016 [facility] Dr FaUbaldo Glassingre-redo CABG  Mid RCA lesion, 70% stenosed. SVG was injected is moderate in size. The graft exhibits minimal luminal irregularities. Prox RCA lesion, 75% stenosed. SVG . SVG . Prox Graft lesion, 100% stenosed. was not visualized . Free LIMA to LAD Prox Graft lesion, 100% stenosed. LM lesion, 80% stenosed. Prox Cx lesion, 65% stenosed.   Will attempt medical manangement. Consideration for redo cabg coulg be raised if medical manangemnt is not adequate.  TELEMETRY reviewed by me (LT) 04/27/2022 : Sinus rhythm 70s  EKG reviewed by me: NSR RBBB rate 81, overall similar to prior from 2022  Data reviewed by me (LT) 04/27/2022: Telemetry, CBC, lipid panel, BMP, EKG, discussed with hospitalist  ASSESSMENT AND PLAN:  [name] [name]. Is a 30yoM with a PMH of CAD s/p CABG x4 in 2013 (SVG to RPDA, SVG to diagonal, free LIMA to LAD occluded, SVG to LCx occluded) with redo CABG x1 2017 with free RIMA to LAD at [facility], HFpEF (LVEF >55%, G1 DD 03/07/2022, mild-moderate aortic stenosis, PAD s/p kissing stent placements to bilateral common iliac arteries 11/20/2021, hypertension, hyperlipidemia, history of esophageal stricture who presented to [facility] ED 04/26/2022 with chest pain.  Cardiology is consulted for further assistance.  #Atypical chest pain #CAD s/p CABG x4 2014 & redo CABG x1 2017 Presents with acute onset of 10 out of 10 "chest pressure" that has been constant since 10 PM on 04/2022.  Not associated with nausea, shortness of breath, radiation, diaphoresis, or worsened with exertion or anything in particular.  He has had "dull chest pain" intermittently, but nothing that lasted this long before.  He has reproducible chest pain with palpation of his sternotomy scar and across his anterior chest wall, upon reevaluation this afternoon he tells me he also had some chest tightness that was not reproducible to palpation as well.  He is clinically  well-appearing and hemodynamically stable, other than fairly hypertensive with SBP 190 during interview.  Troponins minimally elevated and flat trending at 04-01-27.  His pain somewhat improved with IM Toradol x1 but not with SL nitroglycerin. -S/p 325 mg aspirin, continue 81 mg aspirin and 75 mg clopidogrel daily (s/p iliac artery stents 11/2021) -Stop heparin drip -Continue atorvastatin 40 mg daily -Continue metoprolol 50 mg 3 times daily -Continue losartan 100 mg once daily -Continue Imdur 30 mg once daily -S/p IM Toradol x1 with some improvement of his chest wall pain. -Lexiscan Myoview this morning resulted with a moderate inferior scar and mild apical ischemia, overall a low risk study per Dr. Saralyn Pilar. -Okay for discharge today from a cardiac standpoint with close follow-up with Dr. Nehemiah Massed in a week.  #Mild to moderate aortic stenosis #Chronic HFpEF (LVEF greater than 55% 03/07/2022) Appears compensated from a heart failure standpoint, euvolemic on exam.  Continue GDMT as above with metoprolol and losartan.  This patient's plan of care was discussed and created with Dr. Saralyn Pilar and he is in agreement.  Signed: Tristan Schroeder , PA-C 04/27/2022, 1:01 PM [facility] Cardiology

## 2022-04-27 NOTE — Discharge Summary (Signed)
Physician Discharge Summary  [name] [name]. GYI:[id number] DOB: 08/19/1956 DOA: 04/26/2022  PCP: Ranae Plumber, PA  Admit date: 04/26/2022 Discharge date: 04/27/2022  Admitted From: home  Disposition:  home   Recommendations for Outpatient Follow-up:  Follow up with PCP in 1-2 weeks F/u w/ cardio, Dr. Nehemiah Massed, in 1 week  Home Health: no  Equipment/Devices:  Discharge Condition: stable  CODE STATUS: full  Diet recommendation: Heart Healthy / Carb Modified  Brief/Interim Summary: HPI was taken from Dr. Francine Graven: [name] [name]. is a 65 y.o. male with medical history significant for diabetes mellitus, coronary artery disease status post CABG, status post PCI with stent angioplasty, hypertension, sleep apnea, diverticular disease who presents to the ER via EMS for evaluation of chest pain which he describes as someone sitting on his chest with radiation to both arms.  Chest pain started at rest and was associated with shortness of breath.  He denied having any nausea, no vomiting, no diaphoresis or palpitations.  He had taken a sublingual nitroglycerin without any significant improvement in his symptoms so he called EMS.  EMS advised him to take 4 baby aspirin's which he did. During my evaluation he continues to have chest pressure but is stable. He denies having any fever, no chills, no headache, no abdominal pain, no changes in his bowel habits, no dizziness, no lightheadedness, no blurred vision no focal deficit. Labs show an uptrending troponin Twelve-lead EKG shows no acute ST or T wave changes Patient was started on a heparin drip in the ER  As per Dr. Jimmye Norman 04/27/22: Cardio was consulted for unstable angina. Pt had a cardiac stress test which shows moderate inferior scar & mild apical ischemia, overall a low risk study as per cardio. Pt will need to f/u w/ cardio, Dr. Nehemiah Massed, in 1 week. Pt verbalized his understanding   Discharge Diagnoses:  Active Problems:   Spondylosis  without myelopathy or radiculopathy, lumbosacral region   Unstable angina (HCC)   OSA (obstructive sleep apnea)   Essential hypertension   S/P CABG (coronary artery bypass graft)   CKD stage 3 secondary to diabetes ([place])   Hyperglycemia due to diabetes mellitus (HCC)  Spondylosis of lumbosacral region: continue on norco    Unstable angina: w/ hx of CAD. D/c IV heparin drip as per cardio. Continue on metoprolol, losartan, aspirin, plavix, & statin. Echo pending. S/p cardiac stress test which showed moderate inferior scar and mild apical ischemia, overall a low risk study.  Cardio following and recs apprec    DM2: likely well controlled. Continue on levemir, SSI w/ accuchecks   CKDIIIa: Cr is stable    Hx of CAD: s/p CABG. Continue on metoprolol, losartan    HTN: continue on metoprolol, losartan    OSA: CPAP qhs   Transaminitis: mild elevation. Etiology unclear   Discharge Instructions  Discharge Instructions     Diet - low sodium heart healthy   Complete by: As directed    Diet Carb Modified   Complete by: As directed    Discharge instructions   Complete by: As directed    F/u w/ cardio, Dr. Nehemiah Massed, in 1 week. F/u w/ PCP in 1-2 weeks   Increase activity slowly   Complete by: As directed       Allergies as of 04/27/2022   No Known Allergies      Medication List     STOP taking these medications    hydrochlorothiazide 25 MG tablet Commonly known as: HYDRODIURIL  TAKE these medications    allopurinol 100 MG tablet Commonly known as: ZYLOPRIM Take 100 mg by mouth daily.   aspirin EC 81 MG tablet Take 81 mg by mouth daily. Swallow whole.   atorvastatin 40 MG tablet Commonly known as: LIPITOR Take 40 mg by mouth at bedtime.   B-D UF III MINI PEN NEEDLES 31G X 5 MM Misc Generic drug: Insulin Pen Needle 50 mm 2 (two) times daily. 4 mm needles   BD Pen Needle Nano 2nd Gen 32G X 4 MM Misc Generic drug: Insulin Pen Needle Inject 1 applicator into  the skin in the morning and at bedtime.   Cholecalciferol 25 MCG (1000 UT) tablet Take 2,000 Units by mouth daily.   clopidogrel 75 MG tablet Commonly known as: Plavix Take 1 tablet (75 mg total) by mouth daily.   gabapentin 600 MG tablet Commonly known as: NEURONTIN Take 1 tablet (600 mg total) by mouth 2 (two) times daily.   HYDROcodone-acetaminophen 5-325 MG tablet Commonly known as: NORCO/VICODIN Take 1 tablet by mouth 2 (two) times daily as needed for severe pain. Must last 30 days What changed: Another medication with the same name was removed. Continue taking this medication, and follow the directions you see here.   insulin detemir 100 UNIT/ML FlexPen Commonly known as: LEVEMIR Inject 30 Units into the skin at bedtime. What changed:  how much to take when to take this additional instructions   isosorbide mononitrate 30 MG 24 hr tablet Commonly known as: IMDUR Take 30 mg by mouth every morning.   losartan 100 MG tablet Commonly known as: COZAAR Take 1 tablet (100 mg total) by mouth daily. Start taking on: April 28, 2022   metoprolol tartrate 50 MG tablet Commonly known as: LOPRESSOR Take 1 tablet (50 mg total) by mouth 2 (two) times daily. What changed: when to take this   multivitamin with minerals Tabs tablet Take 1 tablet by mouth every evening.   nitroGLYCERIN 0.4 MG SL tablet Commonly known as: NITROSTAT Place 0.4 mg under the tongue every 5 (five) minutes x 3 doses as needed for chest pain.   Ozempic (1 MG/DOSE) 4 MG/3ML Sopn Generic drug: Semaglutide (1 MG/DOSE) Inject 1 mg into the skin once a week.   pantoprazole 40 MG tablet Commonly known as: Protonix Take 1 tablet (40 mg total) by mouth daily.   Potassium 99 MG Tabs Take 1 tablet by mouth daily.        Follow-up Information     Corey Skains, MD Follow up in 1 week(s).   Specialty: Cardiology Contact information: [address] [place] [address] [phone]                No Known Allergies  Consultations: Cardio    Procedures/Studies: DG Chest 2 View  Result Date: 04/25/2022 CLINICAL DATA:  Chest pain EXAM: CHEST - 2 VIEW COMPARISON:  07/16/2021 FINDINGS: Lungs are clear. No pneumothorax or pleural effusion. Coronary artery bypass grafting has been performed. Cardiac size within normal limits. Pulmonary vascularity is normal. No acute bone abnormality. IMPRESSION: No radiographic evidence of acute cardiopulmonary disease. Electronically Signed   By: Fidela Salisbury M.D.   On: 04/25/2022 23:30   (Echo, Carotid, EGD, Colonoscopy, ERCP)    Subjective: Pt denies any complaints    Discharge Exam: Vitals:   04/27/22 1214 04/27/22 1356  BP: (!) 171/70 (!) 165/86  Pulse: 75   Resp: 18   Temp: (!) 97.5 F (36.4  C)   SpO2: 98%    Vitals:   04/27/22 0455 04/27/22 0714 04/27/22 1214 04/27/22 1356  BP: (!) 150/67 (!) 173/76 (!) 171/70 (!) 165/86  Pulse: 79 84 75   Resp: '18 20 18   '$ Temp: 97.9 F (36.6 C) (!) 95.3 F (35.2 C) (!) 97.5 F (36.4 C)   TempSrc:      SpO2: 92% 98% 98%   Weight:      Height:        General: Pt is alert, awake, not in acute distress Cardiovascular: S1/S2 +, no rubs, no gallops Respiratory: CTA bilaterally, no wheezing, no rhonchi Abdominal: Soft, NT, ND, bowel sounds + Extremities: no edema, no cyanosis    The results of significant diagnostics from this hospitalization (including imaging, microbiology, ancillary and laboratory) are listed below for reference.     Microbiology: No results found for this or any previous visit (from the past 240 hour(s)).   Labs: BNP (last 3 results) No results for input(s): "BNP" in the last 8760 hours. Basic Metabolic Panel: Recent Labs  Lab 04/25/22 2304 04/27/22 0442  NA 138 140  K 4.1 3.7  CL 105 107  CO2 24 26  GLUCOSE 317* 126*  BUN 25* 25*  CREATININE 1.08 1.02  CALCIUM 8.8* 9.2   Liver  Function Tests: Recent Labs  Lab 04/25/22 2304  AST 52*  ALT 48*  ALKPHOS 88  BILITOT 1.0  PROT 7.6  ALBUMIN 4.1   No results for input(s): "LIPASE", "AMYLASE" in the last 168 hours. No results for input(s): "AMMONIA" in the last 168 hours. CBC: Recent Labs  Lab 04/25/22 2304 04/27/22 0442  WBC 7.4 6.9  HGB 12.6* 12.4*  HCT 36.7* 35.3*  MCV 87.0 84.4  PLT 182 173   Cardiac Enzymes: No results for input(s): "CKTOTAL", "CKMB", "CKMBINDEX", "TROPONINI" in the last 168 hours. BNP: Invalid input(s): "POCBNP" CBG: Recent Labs  Lab 04/26/22 1205 04/26/22 1719 04/26/22 2110 04/27/22 0713 04/27/22 1213  GLUCAP 151* 121* 118* 102* 102*   D-Dimer No results for input(s): "DDIMER" in the last 72 hours. Hgb A1c Recent Labs    04/26/22 0946  HGBA1C 6.9*   Lipid Profile Recent Labs    04/27/22 0442  CHOL 82  HDL 26*  LDLCALC 26  TRIG 151*  CHOLHDL 3.2   Thyroid function studies No results for input(s): "TSH", "T4TOTAL", "T3FREE", "THYROIDAB" in the last 72 hours.  Invalid input(s): "FREET3" Anemia work up No results for input(s): "VITAMINB12", "FOLATE", "FERRITIN", "TIBC", "IRON", "RETICCTPCT" in the last 72 hours. Urinalysis    Component Value Date/Time   COLORURINE YELLOW (A) 05/28/2016 1111   APPEARANCEUR CLEAR (A) 05/28/2016 1111   APPEARANCEUR Clear 05/16/2013 0059   LABSPEC 1.013 05/28/2016 1111   LABSPEC 1.021 05/16/2013 0059   PHURINE 5.0 05/28/2016 1111   GLUCOSEU NEGATIVE 05/28/2016 1111   GLUCOSEU Negative 05/16/2013 0059   HGBUR NEGATIVE 05/28/2016 1111   BILIRUBINUR NEGATIVE 05/28/2016 1111   BILIRUBINUR Negative 05/16/2013 0059   KETONESUR NEGATIVE 05/28/2016 1111   PROTEINUR NEGATIVE 05/28/2016 1111   NITRITE NEGATIVE 05/28/2016 1111   LEUKOCYTESUR NEGATIVE 05/28/2016 1111   LEUKOCYTESUR Negative 05/16/2013 0059   Sepsis Labs Recent Labs  Lab 04/25/22 2304 04/27/22 0442  WBC 7.4 6.9   Microbiology No results found for this or  any previous visit (from the past 240 hour(s)).   Time coordinating discharge: Over 30 minutes  SIGNED:   Wyvonnia Dusky, MD  [facility] 04/27/2022, 2:29 PM Pager  If 7PM-7AM, please contact night-coverage [email or web address]

## 2022-04-27 NOTE — Care Management Obs Status (Signed)
[place] NOTIFICATION   Patient Details  Name: [name] [name]. MRN: [id number] Date of Birth: [birth year]   Medicare Observation Status Notification Given:  Yes    Candie Chroman, LCSW 04/27/2022, 3:00 PM

## 2022-04-28 LAB — LIPOPROTEIN A (LPA): Lipoprotein (a): 9.8 nmol/L (ref <75.0)

## 2022-05-01 ENCOUNTER — Emergency Department: Payer: Medicare Other

## 2022-05-01 ENCOUNTER — Emergency Department
Admission: EM | Admit: 2022-05-01 | Discharge: 2022-05-01 | Disposition: A | Discharge status: Home / Self Care | Payer: Medicare Other | Attending: Emergency Medicine | Admitting: Emergency Medicine

## 2022-05-01 ENCOUNTER — Other Ambulatory Visit: Payer: Self-pay

## 2022-05-01 DIAGNOSIS — E119 Type 2 diabetes mellitus without complications: Secondary | ICD-10-CM | POA: Diagnosis not present

## 2022-05-01 DIAGNOSIS — Y9389 Activity, other specified: Secondary | ICD-10-CM | POA: Diagnosis not present

## 2022-05-01 DIAGNOSIS — M719 Bursopathy, unspecified: Secondary | ICD-10-CM

## 2022-05-01 DIAGNOSIS — M7010 Bursitis, unspecified hand: Secondary | ICD-10-CM | POA: Insufficient documentation

## 2022-05-01 DIAGNOSIS — I10 Essential (primary) hypertension: Secondary | ICD-10-CM | POA: Diagnosis not present

## 2022-05-01 MED ORDER — MELOXICAM 7.5 MG PO TABS: 7.5000 mg | ORAL_TABLET | Freq: Every day | ORAL | 2 refills | Status: DC

## 2022-05-01 MED ORDER — LIDOCAINE 5 % EX PTCH
1.0000 | MEDICATED_PATCH | CUTANEOUS | Status: DC
  Administered 2022-05-01: 1 via TRANSDERMAL
  Filled 2022-05-01: qty 1

## 2022-05-01 NOTE — ED Provider Notes (Addendum)
[facility] Provider Note    Event Date/Time   First MD Initiated Contact with Patient 05/01/22 1108     (approximate)   History   Leg Pain   HPI  [name] [name]. is a 66 y.o. male with history of hypertension, diabetes, chronic pain presents emergency department complaining of left hip pain.  Patient states [place] difficult to bear weight.  No known injury.  States the pain is on the outside of the hip.  No numbness or tingling      Physical Exam   Triage Vital Signs: ED Triage Vitals  Enc Vitals Group     BP 05/01/22 1025 (!) 152/74     Pulse Rate 05/01/22 1025 78     Resp 05/01/22 1025 18     Temp 05/01/22 1025 98.3 F (36.8 C)     Temp src --      SpO2 05/01/22 1025 98 %     Weight 05/01/22 1111 158 lb 15.2 oz (72.1 kg)     Height 05/01/22 1111 '5\' 3"'$  (1.6 m)     Head Circumference --      Peak Flow --      Pain Score 05/01/22 1023 8     Pain Loc --      Pain Edu? --      Excl. in [place]? --     Most recent vital signs: Vitals:   05/01/22 1025  BP: (!) 152/74  Pulse: 78  Resp: 18  Temp: 98.3 F (36.8 C)  SpO2: 98%     General: Awake, no distress.   CV:  Good peripheral perfusion. regular rate and  rhythm Resp:  Normal effort.  Abd:  No distention.   Other:  Left hip is tender along the trochanter bursa and upper lateral muscles, patient is able to move the leg but does have pain with flexion, extension, internal/external rotation, neurovascular is intact patient is able to bear weight but with difficulty   ED Results / Procedures / Treatments   Labs (all labs ordered are listed, but only abnormal results are displayed) Labs Reviewed - No data to display   EKG     RADIOLOGY X-ray of the left hip    PROCEDURES:   Procedures   MEDICATIONS ORDERED IN ED: Medications  lidocaine (LIDODERM) 5 % 1 patch (has no administration in time range)     IMPRESSION / MDM / ASSESSMENT AND PLAN / ED COURSE  I reviewed  the triage vital signs and the nursing notes.                              Differential diagnosis includes, but is not limited to, bursitis, fracture, sprain, chronic pain  Patient's presentation is most consistent with acute complicated illness / injury requiring diagnostic workup.   X-ray of the left hip individually reviewed and interpreted by me as being negative for fracture.  I did explain these findings to the patient.  We applied a Lidoderm patch.  He is on chronic pain medication so do not want to give him a narcotic.  He was given a prescription for meloxicam 7.5 mg daily due to his diabetes.  He is to follow-up with orthopedics.  He was discharged in stable condition.  I did caution him to only take the meloxicam for 1 week because do not want to have a recurrent GI bleed.      FINAL  CLINICAL IMPRESSION(S) / ED DIAGNOSES   Final diagnoses:  Acute bursitis     Rx / DC Orders   ED Discharge Orders          Ordered    meloxicam (MOBIC) 7.5 MG tablet  Daily        05/01/22 1250             Note:  This document was prepared using Dragon voice recognition software and may include unintentional dictation errors.    Versie Starks, PA-C 05/01/22 1258    Caryn Section Linden Dolin, PA-C 05/01/22 1259    Harvest Dark, MD 05/01/22 1430

## 2022-05-01 NOTE — ED Triage Notes (Signed)
Pt comes with left leg pain for three days. Pt denies any known injuries.

## 2022-05-01 NOTE — Discharge Instructions (Signed)
Follow-up with orthopedics.  They can give you a steroid injection into the area to decrease pain.  Return emergency department worsening

## 2022-05-02 ENCOUNTER — Other Ambulatory Visit (INDEPENDENT_AMBULATORY_CARE_PROVIDER_SITE_OTHER): Payer: Self-pay | Admitting: Vascular Surgery

## 2022-05-02 DIAGNOSIS — I739 Peripheral vascular disease, unspecified: Secondary | ICD-10-CM

## 2022-05-04 ENCOUNTER — Ambulatory Visit (INDEPENDENT_AMBULATORY_CARE_PROVIDER_SITE_OTHER): Payer: Medicare Other | Admitting: Vascular Surgery

## 2022-05-04 ENCOUNTER — Ambulatory Visit (INDEPENDENT_AMBULATORY_CARE_PROVIDER_SITE_OTHER): Payer: Medicare Other

## 2022-05-04 DIAGNOSIS — Z9889 Other specified postprocedural states: Secondary | ICD-10-CM

## 2022-05-04 DIAGNOSIS — I739 Peripheral vascular disease, unspecified: Secondary | ICD-10-CM

## 2022-05-16 ENCOUNTER — Encounter (INDEPENDENT_AMBULATORY_CARE_PROVIDER_SITE_OTHER): Payer: Self-pay | Admitting: *Deleted

## 2022-05-17 NOTE — Progress Notes (Unsigned)
PROVIDER NOTE: Information contained herein reflects review and annotations entered in association with encounter. Interpretation of such information and data should be left to medically-trained personnel. Information provided to patient can be located elsewhere in the medical record under "Patient Instructions". Document created using STT-dictation technology, any transcriptional errors that may result from process are unintentional.    Patient: [name] [name].  Service Category: E/M  Provider: Gaspar Cola, MD  DOB: 02/03/1956  DOS: 05/21/2022  Referring Provider: Ranae Plumber, [place]  MRN: [id number]  Specialty: Interventional Pain Management  PCP: Ranae Plumber, PA  Type: Established Patient  Setting: Ambulatory outpatient    Location: Office  Delivery: Face-to-face     HPI  Mr. Jeevan Kalla., a 66 y.o. year old male, is here today because of his Chronic pain syndrome [G89.4]. Mr. Nordby primary complain today is No chief complaint on file. Last encounter: My last encounter with him was on 02/21/2022. Pertinent problems: Mr. Bertoli has Chronic shoulder pain (3ry area of Pain) (Right); Chronic hip pain (Left); Chronic chest wall pain (1ry area of Pain) (Incisional Midline) (since 04/22/2012); Chronic lower extremity pain (2ry area of Pain) (Bilateral) (R>L); Chronic sacroiliac joint pain (Bilateral) (L>R); Neuropathic pain; Neurogenic pain; Incisional pain; Lumbar facet syndrome (Bilateral) (R>L); Lumbar spondylosis; Keloid skin disorder; Costochondritis; Brachial plexus injury, right; Right sided weakness; Chronic pain syndrome; DDD (degenerative disc disease), lumbosacral; Lumbar facet arthropathy (Multilevel) (Bilateral); 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 Pain Assessment: Severity of   is reported as a  /10. Location:    / . Onset:  . Quality:  . Timing:  . Modifying factor(s):  Marland Kitchen Vitals:  vitals were not taken for this visit.   Reason for encounter: medication management. ***  Routine UDS ordered today.   RTCB: 08/23/2022 Nonopioids transfer 07/25/2020: Gabapentin  Pharmacotherapy Assessment  Analgesic: Hydrocodone/APAP 5/325 mg, 1 tab PO BID (10 mg/day of hydrocodone) MME/day: 10 mg/day.   Monitoring: [place] PMP: PDMP reviewed during this encounter.       Pharmacotherapy: No side-effects or adverse reactions reported. Compliance: No problems identified. Effectiveness: Clinically acceptable.  No notes on file  No results found for: "CBDTHCR" No results found for: "D8THCCBX" No results found for: "D9THCCBX"  UDS:  Summary  Date Value Ref Range Status  05/22/2021 Note  Final    Comment:    ==================================================================== [facility] (MW) ==================================================================== Test                             Result       Flag       Units  Drug Present and Declared for Prescription Verification   Hydrocodone                    1462         EXPECTED   ng/mg creat   Hydromorphone                  360          EXPECTED   ng/mg creat   Dihydrocodeine                 61           EXPECTED  ng/mg creat   Norhydrocodone                 671          EXPECTED   ng/mg creat    Sources of hydrocodone include scheduled prescription medications.    Hydromorphone, dihydrocodeine and norhydrocodone are expected    metabolites of hydrocodone. Hydromorphone and dihydrocodeine are    also available as scheduled prescription medications.  ==================================================================== Test                      Result    Flag   Units      Ref Range   Creatinine              185              mg/dL       >=20 ==================================================================== Declared Medications:  The flagging and interpretation on this report are based on the  following declared medications.  Unexpected results may arise from  inaccuracies in the declared medications.   **Note: The testing scope of this panel includes these medications:   Hydrocodone (Norco)   **Note: The testing scope of this panel does not include the  following reported medications:   Acetaminophen (Norco)  Allopurinol (Zyloprim)  Aspirin  Atorvastatin (Lipitor)  Cholecalciferol  Gabapentin (Neurontin)  Hydrochlorothiazide (Hydrodiuril)  Insulin (Levemir)  Isosorbide (Imdur)  Linagliptin (Tradjenta)  Losartan (Cozaar)  Metoprolol (Lopressor)  Multivitamin  Nitroglycerin (Nitrostat)  Pantoprazole (Protonix)  Potassium  Semaglutide (Ozempic) ==================================================================== For clinical consultation, please call [phone]. ====================================================================       ROS  Constitutional: Denies any fever or chills Gastrointestinal: No reported hemesis, hematochezia, vomiting, or acute GI distress Musculoskeletal: Denies any acute onset joint swelling, redness, loss of ROM, or weakness Neurological: No reported episodes of acute onset apraxia, aphasia, dysarthria, agnosia, amnesia, paralysis, loss of coordination, or loss of consciousness  Medication Review  Cholecalciferol, HYDROcodone-acetaminophen, Insulin Pen Needle, Potassium, Semaglutide (1 MG/DOSE), allopurinol, aspirin EC, atorvastatin, clopidogrel, gabapentin, insulin detemir, isosorbide mononitrate, losartan, meloxicam, metoprolol tartrate, multivitamin with minerals, nitroGLYCERIN, and pantoprazole  History Review  Allergy: Mr. Markowicz has No Known Allergies. Drug: Mr. Weiand  reports no history of drug use. Alcohol:  reports current alcohol use of about 14.0  standard drinks of alcohol per week. Tobacco:  reports that he has quit smoking. His smoking use included cigarettes. He has quit using smokeless tobacco. Social: Mr. Benegas  reports that he has quit smoking. His smoking use included cigarettes. He has quit using smokeless tobacco. He reports current alcohol use of about 14.0 standard drinks of alcohol per week. He reports that he does not use drugs. Medical:  has a past medical history of Benign esophageal stricture, Cardiomyopathy, secondary ([place]), Coronary artery disease, Diabetes mellitus without complication ([place]), Diverticulitis, GI bleed, Gout, Hemorrhoids, Hyperlipidemia, Hypertension, Sleep apnea, and Tubular adenoma of colon. Surgical: Mr. Burnside  has a past surgical history that includes Coronary angioplasty with stent; cardiac bypass; Cardiac surgery; Colonoscopy with propofol (N/A, 04/29/2015); Esophagogastroduodenoscopy (egd) with propofol (N/A, 04/29/2015); Septoplasty; Cardiac catheterization (Left, 02/03/2016); Coronary artery bypass graft; Colonoscopy with propofol (N/A, 07/20/2020); and Lower Extremity Angiography (Left, 11/20/2021). Family: family history includes Heart disease in his mother; Hypertension in his father.  Laboratory Chemistry Profile   Renal Lab Results  Component Value Date   BUN 25 (H) 04/27/2022   CREATININE 1.02 04/27/2022   GFRAA >60 05/07/2019   GFRNONAA >60 04/27/2022    Hepatic Lab Results  Component Value Date   AST 52 (H) 04/25/2022   ALT 48 (H) 04/25/2022   ALBUMIN 4.1 04/25/2022   ALKPHOS 88 04/25/2022   HCVAB NON REACTIVE 10/09/2020   LIPASE 39 07/16/2021    Electrolytes Lab Results  Component Value Date   NA 140 04/27/2022   K 3.7 04/27/2022   CL 107 04/27/2022   CALCIUM 9.2 04/27/2022   MG 2.2 10/09/2020   PHOS 3.7 10/09/2020    Bone Lab Results  Component Value Date   VD25OH 74.7 05/07/2019   VD125OH2TOT 30.8 10/26/2015    Inflammation (CRP: Acute Phase) (ESR: Chronic  Phase) Lab Results  Component Value Date   CRP 0.9 05/07/2019   ESRSEDRATE 41 (H) 05/07/2019   LATICACIDVEN 1.9 10/09/2020         Note: Above Lab results reviewed.  Recent Imaging Review  VAS US AORTA/IVC/ILIACS ABDOMINAL AORTA STUDY  Patient Name:  Wilver Tignor.  Date of Exam:   05/04/2022 Medical Rec #: [id number]           Accession #:    [id number] Date of Birth: [birth year]            Patient Gender: M Patient Age:   33 years Exam Location:  [facility] Procedure:      VAS US AORTA/IVC/ILIACS Referring Phys: Leotis Pain  --------------------------------------------------------------------------------   Performing Technologist: Concha Norway RVT    Examination Guidelines: A complete evaluation includes B-mode imaging, spectral Doppler, color Doppler, and power Doppler as needed of all accessible portions of each vessel. Bilateral testing is considered an integral part of a complete examination. Limited examinations for reoccurring indications may be performed as noted.    Abdominal Aorta Findings: +-------------+-------+----------+----------+----------+--------+--------+ Location     AP (cm)Trans (cm)PSV (cm/s)Waveform  ThrombusComments +-------------+-------+----------+----------+----------+--------+--------+ Proximal     1.55   1.40      50                                   +-------------+-------+----------+----------+----------+--------+--------+ Mid                           69                                   +-------------+-------+----------+----------+----------+--------+--------+ Distal                        101                                  +-------------+-------+----------+----------+----------+--------+--------+ RT CIA Prox                   136       biphasic                   +-------------+-------+----------+----------+----------+--------+--------+ RT CIA Mid                    143        biphasic                   +-------------+-------+----------+----------+----------+--------+--------+ RT CIA Distal                 152  biphasic                   +-------------+-------+----------+----------+----------+--------+--------+ RT EIA Prox                   178       biphasic                   +-------------+-------+----------+----------+----------+--------+--------+ RT EIA Mid                    138       biphasic                   +-------------+-------+----------+----------+----------+--------+--------+ RT EIA Distal                 105       biphasic                   +-------------+-------+----------+----------+----------+--------+--------+ LT CIA Prox                   126       monophasic                 +-------------+-------+----------+----------+----------+--------+--------+ LT CIA Mid                    191       biphasic                   +-------------+-------+----------+----------+----------+--------+--------+ LT CIA Distal                 153       biphasic                   +-------------+-------+----------+----------+----------+--------+--------+ LT EIA Prox                   137       biphasic                   +-------------+-------+----------+----------+----------+--------+--------+ LT EIA Mid                    132       biphasic                   +-------------+-------+----------+----------+----------+--------+--------+ LT EIA Distal                 84        biphasic                   +-------------+-------+----------+----------+----------+--------+--------+     Summary: Abdominal Aorta: No evidence of an abdominal aortic aneurysm was visualized. The largest aortic measurement is 1.6 cm. Evidence of patent bilateral CIA stents with no evidence of significant stenosis.   *See table(s) above for measurements and observations.   Electronically signed by Leotis Pain MD on  05/11/2022 at 8:51:51 AM.       Final   VAS [place] ABI WITH/WO TBI  LOWER EXTREMITY DOPPLER STUDY  Patient Name:  MAYAN KLOEPFER  Date of Exam:   05/04/2022 Medical Rec #: [id number]       Accession #:    [id number] Date of Birth: [birth year]        Patient Gender: M Patient Age:   26 years Exam Location:  [facility] Procedure:      VAS [place] ABI WITH/WO TBI Referring Phys:  --------------------------------------------------------------------------------   Indications:  Peripheral artery disease.   Vascular Interventions: 11/20/21: Bilateral CIA stents;.  Performing Technologist: Concha Norway RVT    Examination Guidelines: A complete evaluation includes at minimum, Doppler waveform signals and systolic blood pressure reading at the level of bilateral brachial, anterior tibial, and posterior tibial arteries, when vessel segments are accessible. Bilateral testing is considered an integral part of a complete examination. Photoelectric Plethysmograph (PPG) waveforms and toe systolic pressure readings are included as required and additional duplex testing as needed. Limited examinations for reoccurring indications may be performed as noted.    ABI Findings: +---------+------------------+-----+---------+--------+ Right    Rt Pressure (mmHg)IndexWaveform Comment  +---------+------------------+-----+---------+--------+ Brachial 167                                      +---------+------------------+-----+---------+--------+ ATA      164               0.98 triphasic         +---------+------------------+-----+---------+--------+ PTA      175               1.05 triphasic         +---------+------------------+-----+---------+--------+ Great Toe147               0.88 Normal            +---------+------------------+-----+---------+--------+  +---------+------------------+-----+---------+-------+ Left     Lt Pressure (mmHg)IndexWaveform  Comment +---------+------------------+-----+---------+-------+ Brachial 165                                     +---------+------------------+-----+---------+-------+ ATA      178               1.07 triphasic        +---------+------------------+-----+---------+-------+ PTA      170               1.02 triphasic        +---------+------------------+-----+---------+-------+ Great Toe139               0.83 Normal           +---------+------------------+-----+---------+-------+  +-------+-----------+-----------+------------+------------+ ABI/TBIToday's ABIToday's TBIPrevious ABIPrevious TBI +-------+-----------+-----------+------------+------------+ Right  1.05       .88        1.14        1.06         +-------+-----------+-----------+------------+------------+ Left   1.07       .83        1.10        1.10         +-------+-----------+-----------+------------+------------+  Bilateral ABIs and TBIs appear essentially unchanged.   Summary: Right: Resting right ankle-brachial index is within normal range. The right toe-brachial index is normal.  Left: Resting left ankle-brachial index is within normal range. The left toe-brachial index is normal.  *See table(s) above for measurements and observations.    Electronically signed by Leotis Pain MD on 05/11/2022 at 8:51:43 AM.      Final   Note: Reviewed        Physical Exam  General appearance: Well nourished, well developed, and well hydrated. In no apparent acute distress Mental status: Alert, oriented x 3 (person, place, & time)       Respiratory: No evidence of acute respiratory distress Eyes: PERLA Vitals: There were no vitals taken for this visit. BMI: Estimated body  mass index is 28.16 kg/m as calculated from the following:   Height as of 05/01/22: 5' 3" (1.6 m).   Weight as of 05/01/22: 158 lb 15.2 oz (72.1 kg). Ideal: Patient weight not recorded  Assessment   Diagnosis Status  1.  Chronic pain syndrome   2. Chronic chest wall pain (1ry area of Pain) (Incisional Midline) (since 04/22/2012)   3. Chronic lower extremity pain (2ry area of Pain) (Bilateral) (R>L)   4. Chronic shoulder pain (3ry area of Pain) (Right)   5. Chronic low back pain (Bilateral) (R>L) w/o sciatica   6. Lumbar facet syndrome (Bilateral) (R>L)   7. Pharmacologic therapy   8. Chronic use of opiate for therapeutic purpose   9. Encounter for medication management   10. Encounter for chronic pain management    Controlled Controlled Controlled   Updated Problems: No problems updated.  Plan of Care  Problem-specific:  No problem-specific Assessment & Plan notes found for this encounter.  Mr. Adan Baehr. has a current medication list which includes the following long-term medication(s): allopurinol, atorvastatin, gabapentin, hydrocodone-acetaminophen, insulin detemir, isosorbide mononitrate, losartan, metoprolol tartrate, and pantoprazole.  Pharmacotherapy (Medications Ordered): No orders of the defined types were placed in this encounter.  Orders:  No orders of the defined types were placed in this encounter.  Follow-up plan:   No follow-ups on file.     Interventional Therapies  Risk  Complexity Considerations:   Estimated body mass index is 28.34 kg/m as calculated from the following:   Height as of this encounter: 5' 3" (1.6 m).   Weight as of this encounter: 160 lb (72.6 kg). WNL   Planned  Pending:      Under consideration:   Therapeutic bilateral Lumbar facet + bilateral sacroiliac joint RFA #1    Completed:   Palliative left IA hip inj. x1 (05/13/2018) (100/100/75/>75)  Palliative bilateral lumbar facet MBB x2 (02/21/2016)  Palliative bilateral SI Blk x1 (02/21/2016)    Therapeutic  Palliative (PRN) options:   Palliative left intra-articular hip joint injection #2  Palliative bilateral lumbar facet block #3  Palliative bilateral sacroiliac joint block #2       Recent Visits Date Type Provider Dept  02/21/22 Office Visit Milinda Pointer, MD [facility]  Showing recent visits within past 90 days and meeting all other requirements Future Appointments Date Type Provider Dept  05/21/22 Appointment Milinda Pointer, MD [facility]  Showing future appointments within next 90 days and meeting all other requirements  I discussed the assessment and treatment plan with the patient. The patient was provided an opportunity to ask questions and all were answered. The patient agreed with the plan and demonstrated an understanding of the instructions.  Patient advised to call back or seek an in-person evaluation if the symptoms or condition worsens.  Duration of encounter: *** minutes.  Total time on encounter, as per AMA guidelines included both the face-to-face and non-face-to-face time personally spent by the physician and/or other qualified health care professional(s) on the day of the encounter (includes time in activities that require the physician or other qualified health care professional and does not include time in activities normally performed by clinical staff). Physician's time may include the following activities when performed: preparing to see the patient (eg, review of tests, pre-charting review of records) obtaining and/or reviewing separately obtained history performing a medically appropriate examination and/or evaluation counseling and educating the patient/family/caregiver ordering medications, tests, or procedures referring and communicating with other health  care professionals (when not separately reported) documenting clinical information in the electronic or other health record independently interpreting results (not separately reported) and communicating results to the patient/ family/caregiver care coordination (not separately reported)  Note by: Gaspar Cola, MD Date: 05/21/2022; Time: 1:16 PM

## 2022-05-21 ENCOUNTER — Encounter: Payer: Self-pay | Admitting: Pain Medicine

## 2022-05-21 ENCOUNTER — Ambulatory Visit: Payer: Medicare Other | Attending: Pain Medicine | Admitting: Pain Medicine

## 2022-05-21 VITALS — BP 139/77 | HR 79 | Temp 97.9°F | Ht 63.0 in | Wt 160.0 lb

## 2022-05-21 DIAGNOSIS — Z79899 Other long term (current) drug therapy: Secondary | ICD-10-CM | POA: Insufficient documentation

## 2022-05-21 DIAGNOSIS — M25511 Pain in right shoulder: Secondary | ICD-10-CM | POA: Insufficient documentation

## 2022-05-21 DIAGNOSIS — G8929 Other chronic pain: Secondary | ICD-10-CM | POA: Insufficient documentation

## 2022-05-21 DIAGNOSIS — Z79891 Long term (current) use of opiate analgesic: Secondary | ICD-10-CM | POA: Diagnosis present

## 2022-05-21 DIAGNOSIS — M47816 Spondylosis without myelopathy or radiculopathy, lumbar region: Secondary | ICD-10-CM | POA: Insufficient documentation

## 2022-05-21 DIAGNOSIS — M79605 Pain in left leg: Secondary | ICD-10-CM | POA: Diagnosis present

## 2022-05-21 DIAGNOSIS — M79604 Pain in right leg: Secondary | ICD-10-CM | POA: Diagnosis present

## 2022-05-21 DIAGNOSIS — I200 Unstable angina: Secondary | ICD-10-CM | POA: Diagnosis not present

## 2022-05-21 DIAGNOSIS — R0789 Other chest pain: Secondary | ICD-10-CM | POA: Insufficient documentation

## 2022-05-21 DIAGNOSIS — G894 Chronic pain syndrome: Secondary | ICD-10-CM | POA: Insufficient documentation

## 2022-05-21 DIAGNOSIS — M545 Low back pain, unspecified: Secondary | ICD-10-CM | POA: Insufficient documentation

## 2022-05-21 MED ORDER — HYDROCODONE-ACETAMINOPHEN 5-325 MG PO TABS: 1.0000 | ORAL_TABLET | Freq: Two times a day (BID) | ORAL | 0 refills | Status: DC | PRN

## 2022-05-21 NOTE — Progress Notes (Signed)
Nursing Pain Medication Assessment:  Safety precautions to be maintained throughout the outpatient stay will include: orient to surroundings, keep bed in low position, maintain call bell within reach at all times, provide assistance with transfer out of bed and ambulation.  Medication Inspection Compliance: Pill count conducted under aseptic conditions, in front of the patient. Neither the pills nor the bottle was removed from the patient's sight at any time. Once count was completed pills were immediately returned to the patient in their original bottle.  Medication: Hydrocodone/APAP Pill/Patch Count:  9 of 60 pills remain Pill/Patch Appearance: Markings consistent with prescribed medication Bottle Appearance: Standard pharmacy container. Clearly labeled. Filled Date: 8 / 23 / 2023 Last Medication intake:  TodaySafety precautions to be maintained throughout the outpatient stay will include: orient to surroundings, keep bed in low position, maintain call bell within reach at all times, provide assistance with transfer out of bed and ambulation.

## 2022-05-21 NOTE — Patient Instructions (Signed)

## 2022-05-24 LAB — TOXASSURE SELECT 13 (MW), URINE

## 2022-07-02 ENCOUNTER — Encounter (INDEPENDENT_AMBULATORY_CARE_PROVIDER_SITE_OTHER): Payer: Self-pay

## 2022-08-06 DIAGNOSIS — I350 Nonrheumatic aortic (valve) stenosis: Secondary | ICD-10-CM | POA: Insufficient documentation

## 2022-08-12 NOTE — Progress Notes (Unsigned)
PROVIDER NOTE: Information contained herein reflects review and annotations entered in association with encounter. Interpretation of such information and data should be left to medically-trained personnel. Information provided to patient can be located elsewhere in the medical record under "Patient Instructions". Document created using STT-dictation technology, any transcriptional errors that may result from process are unintentional.    Patient: [name] [name].  Service Category: E/M  Provider: Gaspar Cola, MD  DOB: 1955-11-19  DOS: 08/13/2022  Referring Provider: Ranae Plumber, [place]  MRN: [id number]  Specialty: Interventional [name] Management  PCP: Ranae Plumber, PA  Type: Established Patient  Setting: Ambulatory outpatient    Location: Office  Delivery: Face-to-face     HPI  [name], a 66 y.o. 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 [name] primary complain today is No chief complaint on file. Last encounter: My last encounter with him was on 05/21/2022. Pertinent problems: [name] has Chronic shoulder [name] (3ry area of [name]) (Right); Chronic hip [name] (Left); Chronic chest wall [name] (1ry area of [name]) (Incisional Midline) (since 04/22/2012); Chronic lower extremity [name] (2ry area of [name]) (Bilateral) (R>L); Chronic sacroiliac joint [name] (Bilateral) (L>R); Neuropathic [name]; Neurogenic [name]; Incisional [name]; Lumbar facet syndrome (Bilateral) (R>L); Lumbar spondylosis; Keloid skin disorder; Costochondritis; Brachial plexus injury, right; Right sided weakness; Chronic [name] syndrome; DDD (degenerative disc disease), lumbosacral; Lumbar facet arthropathy (Multilevel) (Bilateral); 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 [name] Assessment: Severity of   is reported as a  /10. Location:    / . Onset:  . Quality:  . Timing:  . Modifying factor(s):  Marland Kitchen Vitals:  vitals were not taken for this visit.  BMI: Estimated body mass index is 28.34 kg/m as calculated from the following:   Height as of 05/21/22: _0  (1.6 m).   Weight as of 05/21/22: 160 lb (72.6 kg).  Reason for encounter: medication management. ***  R3/20/2024  Nonopioids transfer 07/25/2020: Gabapentin  Pharmacotherapy Assessment  Analgesic: Hydrocodone/APAP 5/325 mg, 1 tab PO BID (10 mg/day of hydrocodone) MME/day: 10 mg/day.   Monitoring: [place] PMP: PDMP reviewed during this encounter.       Pharmacotherapy: No side-effects or adverse reactions reported. Compliance: No problems identified. Effectiveness: Clinically acceptable.  No notes on file  No results found for: "CBDTHCR" No results found for: "D8THCCBX" No results found for: "D9THCCBX"  UDS:  Summary  Date Value Ref Range Status  05/21/2022 Note  Final    Comment:    ==================================================================== [facility] (MW) ==================================================================== Test                             Result       Flag       Units  Drug Present and Declared for Prescription Verification   Hydrocodone                    1642         EXPECTED   ng/mg creat   Hydromorphone                  324          EXPECTED   ng/mg creat   Dihydrocodeine  62           EXPECTED   ng/mg creat   Norhydrocodone                 1263         EXPECTED   ng/mg creat    Sources of hydrocodone include scheduled prescription medications.    Hydromorphone, dihydrocodeine and norhydrocodone are expected    metabolites of hydrocodone. Hydromorphone and dihydrocodeine are    also available as scheduled prescription medications.  ==================================================================== Test                       Result    Flag   Units      Ref Range   Creatinine              307              mg/dL      >=20 ==================================================================== Declared Medications:  The flagging and interpretation on this report are based on the  following declared medications.  Unexpected results may arise from  inaccuracies in the declared medications.   **Note: The testing scope of this panel includes these medications:   Hydrocodone (Norco)   **Note: The testing scope of this panel does not include the  following reported medications:   Acetaminophen (Norco)  Allopurinol (Zyloprim)  Aspirin  Atorvastatin (Lipitor)  Cholecalciferol  Clopidogrel (Plavix)  Gabapentin (Neurontin)  Insulin (Levemir)  Isosorbide (Imdur)  Losartan (Cozaar)  Meloxicam (Mobic)  Metoprolol (Lopressor)  Multivitamin  Nitroglycerin (Nitrostat)  Pantoprazole (Protonix)  Potassium  Semaglutide (Ozempic) ==================================================================== For clinical consultation, please call [phone]. ====================================================================       ROS  Constitutional: Denies any fever or chills Gastrointestinal: No reported hemesis, hematochezia, vomiting, or acute GI distress Musculoskeletal: Denies any acute onset joint swelling, redness, loss of ROM, or weakness Neurological: No reported episodes of acute onset apraxia, aphasia, dysarthria, agnosia, amnesia, paralysis, loss of coordination, or loss of consciousness  Medication Review  Cholecalciferol, HYDROcodone-acetaminophen, Insulin Pen Needle, Potassium, Semaglutide (1 MG/DOSE), allopurinol, aspirin EC, atorvastatin, clopidogrel, gabapentin, insulin detemir, isosorbide mononitrate, losartan, meloxicam, metoprolol tartrate, multivitamin with minerals, nitroGLYCERIN, and pantoprazole  History Review  Allergy: [name] has No Known Allergies. Drug: [name]  reports  no history of drug use. Alcohol:  reports current alcohol use of about 14.0 standard drinks of alcohol per week. Tobacco:  reports that he has quit smoking. His smoking use included cigarettes. He has quit using smokeless tobacco. Social: [name]  reports that he has quit smoking. His smoking use included cigarettes. He has quit using smokeless tobacco. He reports current alcohol use of about 14.0 standard drinks of alcohol per week. He reports that he does not use drugs. Medical:  has a past medical history of Benign esophageal stricture, Cardiomyopathy, secondary ([place]), Coronary artery disease, Diabetes mellitus without complication ([place]), Diverticulitis, GI bleed, Gout, Hemorrhoids, Hyperlipidemia, Hypertension, Sleep apnea, and Tubular adenoma of colon. Surgical: Mr. Zagami  has a past surgical history that includes Coronary angioplasty with stent; cardiac bypass; Cardiac surgery; Colonoscopy with propofol (N/A, 04/29/2015); Esophagogastroduodenoscopy (egd) with propofol (N/A, 04/29/2015); Septoplasty; Cardiac catheterization (Left, 02/03/2016); Coronary artery bypass graft; Colonoscopy with propofol (N/A, 07/20/2020); and Lower Extremity Angiography (Left, 11/20/2021). Family: family history includes Heart disease in his mother; Hypertension in his father.  Laboratory Chemistry Profile   Renal Lab Results  Component Value Date   BUN 25 (H) 04/27/2022   CREATININE 1.02 04/27/2022   GFRAA >  60 05/07/2019   GFRNONAA >60 04/27/2022    Hepatic Lab Results  Component Value Date   AST 52 (H) 04/25/2022   ALT 48 (H) 04/25/2022   ALBUMIN 4.1 04/25/2022   ALKPHOS 88 04/25/2022   HCVAB NON REACTIVE 10/09/2020   LIPASE 39 07/16/2021    Electrolytes Lab Results  Component Value Date   NA 140 04/27/2022   K 3.7 04/27/2022   CL 107 04/27/2022   CALCIUM 9.2 04/27/2022   MG 2.2 10/09/2020   PHOS 3.7 10/09/2020    Bone Lab Results  Component Value Date   VD25OH 74.7 05/07/2019   VD125OH2TOT  30.8 10/26/2015    Inflammation (CRP: Acute Phase) (ESR: Chronic Phase) Lab Results  Component Value Date   CRP 0.9 05/07/2019   ESRSEDRATE 41 (H) 05/07/2019   LATICACIDVEN 1.9 10/09/2020         Note: Above Lab results reviewed.  Recent Imaging Review  VAS US AORTA/IVC/ILIACS ABDOMINAL AORTA STUDY  Patient Name:  [name] [name].  Date of Exam:   05/04/2022 Medical Rec #: [id number]           Accession #:    [id number] Date of Birth: October 29, 1955            Patient Gender: M Patient Age:   36 years Exam Location:  [facility] Procedure:      VAS US AORTA/IVC/ILIACS Referring Phys: [name] [name]  --------------------------------------------------------------------------------   Performing Technologist: Concha Norway RVT    Examination Guidelines: A complete evaluation includes B-mode imaging, spectral Doppler, color Doppler, and power Doppler as needed of all accessible portions of each vessel. Bilateral testing is considered an integral part of a complete examination. Limited examinations for reoccurring indications may be performed as noted.    Abdominal Aorta Findings: +-------------+-------+----------+----------+----------+--------+--------+ Location     AP (cm)Trans (cm)PSV (cm/s)Waveform  ThrombusComments +-------------+-------+----------+----------+----------+--------+--------+ Proximal     1.55   1.40      50                                   +-------------+-------+----------+----------+----------+--------+--------+ Mid                           69                                   +-------------+-------+----------+----------+----------+--------+--------+ Distal                        101                                  +-------------+-------+----------+----------+----------+--------+--------+ RT CIA Prox                   136       biphasic                    +-------------+-------+----------+----------+----------+--------+--------+ RT CIA Mid                    143       biphasic                   +-------------+-------+----------+----------+----------+--------+--------+ RT CIA Distal  152       biphasic                   +-------------+-------+----------+----------+----------+--------+--------+ RT EIA Prox                   178       biphasic                   +-------------+-------+----------+----------+----------+--------+--------+ RT EIA Mid                    138       biphasic                   +-------------+-------+----------+----------+----------+--------+--------+ RT EIA Distal                 105       biphasic                   +-------------+-------+----------+----------+----------+--------+--------+ LT CIA Prox                   126       monophasic                 +-------------+-------+----------+----------+----------+--------+--------+ LT CIA Mid                    191       biphasic                   +-------------+-------+----------+----------+----------+--------+--------+ LT CIA Distal                 153       biphasic                   +-------------+-------+----------+----------+----------+--------+--------+ LT EIA Prox                   137       biphasic                   +-------------+-------+----------+----------+----------+--------+--------+ LT EIA Mid                    132       biphasic                   +-------------+-------+----------+----------+----------+--------+--------+ LT EIA Distal                 84        biphasic                   +-------------+-------+----------+----------+----------+--------+--------+     Summary: Abdominal Aorta: No evidence of an abdominal aortic aneurysm was visualized. The largest aortic measurement is 1.6 cm. Evidence of patent bilateral CIA stents with no evidence of  significant stenosis.   *See table(s) above for measurements and observations.   Electronically signed by [name] Pain MD on 05/11/2022 at 8:51:51 AM.       Final   VAS [place] ABI WITH/WO TBI  LOWER EXTREMITY DOPPLER STUDY  Patient Name:  [name] [name]  Date of Exam:   05/04/2022 Medical Rec #: [id number]       Accession #:    [id number] Date of Birth: [birth year]        Patient Gender: M Patient Age:   59 years Exam Location:  [facility] Procedure:      VAS [place] ABI WITH/WO TBI  Referring Phys:  --------------------------------------------------------------------------------   Indications: Peripheral artery disease.   Vascular Interventions: 11/20/21: Bilateral CIA stents;.  Performing Technologist: Concha Norway RVT    Examination Guidelines: A complete evaluation includes at minimum, Doppler waveform signals and systolic blood pressure reading at the level of bilateral brachial, anterior tibial, and posterior tibial arteries, when vessel segments are accessible. Bilateral testing is considered an integral part of a complete examination. Photoelectric Plethysmograph (PPG) waveforms and toe systolic pressure readings are included as required and additional duplex testing as needed. Limited examinations for reoccurring indications may be performed as noted.    ABI Findings: +---------+------------------+-----+---------+--------+ Right    Rt Pressure (mmHg)IndexWaveform Comment  +---------+------------------+-----+---------+--------+ Brachial 167                                      +---------+------------------+-----+---------+--------+ ATA      164               0.98 triphasic         +---------+------------------+-----+---------+--------+ PTA      175               1.05 triphasic         +---------+------------------+-----+---------+--------+ Great Toe147               0.88 Normal             +---------+------------------+-----+---------+--------+  +---------+------------------+-----+---------+-------+ Left     Lt Pressure (mmHg)IndexWaveform Comment +---------+------------------+-----+---------+-------+ Brachial 165                                     +---------+------------------+-----+---------+-------+ ATA      178               1.07 triphasic        +---------+------------------+-----+---------+-------+ PTA      170               1.02 triphasic        +---------+------------------+-----+---------+-------+ Great Toe139               0.83 Normal           +---------+------------------+-----+---------+-------+  +-------+-----------+-----------+------------+------------+ ABI/TBIToday's ABIToday's TBIPrevious ABIPrevious TBI +-------+-----------+-----------+------------+------------+ Right  1.05       .88        1.14        1.06         +-------+-----------+-----------+------------+------------+ Left   1.07       .83        1.10        1.10         +-------+-----------+-----------+------------+------------+  Bilateral ABIs and TBIs appear essentially unchanged.   Summary: Right: Resting right ankle-brachial index is within normal range. The right toe-brachial index is normal.  Left: Resting left ankle-brachial index is within normal range. The left toe-brachial index is normal.  *See table(s) above for measurements and observations.    Electronically signed by [name] Pain MD on 05/11/2022 at 8:51:43 AM.      Final   Note: Reviewed        Physical Exam  General appearance: Well nourished, well developed, and well hydrated. In no apparent acute distress Mental status: Alert, oriented x 3 (person, place, & time)       Respiratory: No evidence of acute respiratory distress Eyes: PERLA Vitals: There were no vitals  taken for this visit. BMI: Estimated body mass index is 28.34 kg/m as calculated from the following:    Height as of 05/21/22: _0  (1.6 m).   Weight as of 05/21/22: 160 lb (72.6 kg). Ideal: Patient weight not recorded  Assessment   Diagnosis Status  1. Chronic [name] syndrome   2. Chronic chest wall [name] (1ry area of [name]) (Incisional Midline) (since 04/22/2012)   3. Chronic lower extremity [name] (2ry area of [name]) (Bilateral) (R>L)   4. Chronic shoulder [name] (3ry area of [name]) (Right)   5. Lumbar facet syndrome (Bilateral) (R>L)   6. Chronic low back [name] (Bilateral) (R>L) w/o sciatica   7. Pharmacologic therapy   8. Chronic use of opiate for therapeutic purpose   9. Encounter for medication management   10. Encounter for chronic [name] management    Controlled Controlled Controlled   Updated Problems: No problems updated.  Plan of Care  Problem-specific:  No problem-specific Assessment & Plan notes found for this encounter.  [name] [name]. has a current medication list which includes the following long-term medication(s): allopurinol, atorvastatin, gabapentin, hydrocodone-acetaminophen, insulin detemir, isosorbide mononitrate, losartan, metoprolol tartrate, and pantoprazole.  Pharmacotherapy (Medications Ordered): No orders of the defined types were placed in this encounter.  Orders:  No orders of the defined types were placed in this encounter.  Follow-up plan:   No follow-ups on file.     Interventional Therapies  Risk  Complexity Considerations:   Estimated body mass index is 28.34 kg/m as calculated from the following:   Height as of this encounter: _1  (1.6 m).   Weight as of this encounter: 160 lb (72.6 kg). WNL   Planned  Pending:      Under consideration:   Therapeutic bilateral Lumbar facet + bilateral sacroiliac joint RFA #1    Completed:   Palliative left IA hip inj. x1 (05/13/2018) (100/100/75/>75)  Palliative bilateral lumbar facet MBB x2 (02/21/2016)  Palliative bilateral SI Blk x1 (02/21/2016)    Therapeutic  Palliative (PRN) options:    Palliative left intra-articular hip joint injection #2  Palliative bilateral lumbar facet block #3  Palliative bilateral sacroiliac joint block #2    Pharmacotherapy:  Nonopioids transfer 07/25/2020: Gabapentin Recommendations:   None at this time.      Recent Visits Date Type Provider Dept  05/21/22 Office Visit Milinda Pointer, MD [name]  Showing recent visits within past 90 days and meeting all other requirements Future Appointments Date Type Provider Dept  08/13/22 Appointment Milinda Pointer, MD [name]  Showing future appointments within next 90 days and meeting all other requirements  I discussed the assessment and treatment plan with the patient. The patient was provided an opportunity to ask questions and all were answered. The patient agreed with the plan and demonstrated an understanding of the instructions.  Patient advised to call back or seek an in-person evaluation if the symptoms or condition worsens.  Duration of encounter: *** minutes.  Total time on encounter, as per AMA guidelines included both the face-to-face and non-face-to-face time personally spent by the physician and/or other qualified health care professional(s) on the day of the encounter (includes time in activities that require the physician or other qualified health care professional and does not include time in activities normally performed by clinical staff). Physician's time may include the following activities when performed: preparing to see the patient (eg, review of tests, pre-charting review of records) obtaining and/or reviewing separately obtained history performing a  medically appropriate examination and/or evaluation counseling and educating the patient/family/caregiver ordering medications, tests, or procedures referring and communicating with other health care professionals (when not separately reported) documenting clinical information in the  electronic or other health record independently interpreting results (not separately reported) and communicating results to the patient/ family/caregiver care coordination (not separately reported)  Note by: Gaspar Cola, MD Date: 08/13/2022; Time: 3:49 PM

## 2022-08-12 NOTE — Patient Instructions (Signed)
____________________________________________________________________________________________  National Pain Medication Shortage  The [place] is experiencing worsening drug shortages. These have had a negative widespread effect on patient care and treatment. Not expected to improve any time soon. Predicted to last past 2029.   Drug shortage list (generic names) Oxycodone IR Oxycodone/APAP Oxymorphone IR Hydromorphone Hydrocodone/APAP Morphine  Where is the problem?  Manufacturing and supply level.  Will this shortage affect you?  Only if you take any of the above pain medications.  How? You may be unable to fill your prescription.  Your pharmacist may offer a "partial fill" of your prescription. (Warning: Do not accept partial fills.) Read our Medication Rules and Regulation. Depending on how much medicine you are dependent on, you may experience withdrawals when unable to get the medication.  Recommendations: Consider ending your dependence on opioid pain medications. Ask your pain specialist to assist you with the process. Consider switching to a medication currently not in shortage, such as Buprenorphine. Talk to your pain specialist about this option. Consider decreasing your pain medication requirements by managing tolerance thru "Drug Holidays". This may help minimize withdrawals, should you run out of medicine. Control your pain thru the use of non-pharmacological interventional therapies.   Your prescriber: Prescribers cannot be blamed for shortages. Medication manufacturing and supply issues cannot be fixed by the prescriber.   NOTE: The prescriber is not responsible for supplying the medication, or solving supply issues. Work with your pharmacist to solve it. The patient is responsible for the decision to take or continue taking the medication and for identifying and securing a legal supply source. By law, supplying the medication is the job and responsibility of the  pharmacy. The prescriber is responsible for the evaluation, monitoring, and prescribing of these medications.   Prescribers will NOT: Re-issue prescriptions that have been partially filled. Re-issue prescriptions already sent to a pharmacy.  Re-send prescriptions to a different pharmacy because yours did not have your medication. Ask pharmacist to order the medicine or transfer it from one of their other pharmacies.  New 2023 regulation: "May 04, 2022 Revised Regulation Allows DEA-Registered Pharmacies to Transfer Electronic Prescriptions at a Patient's Request [place] Patients now have the ability to request their electronic prescription be transferred to another pharmacy without having to go back to their practitioner to initiate the request. This revised regulation went into effect on Monday, April 30, 2022.     At a patient's request, a DEA-registered retail pharmacy can now transfer an electronic prescription for a controlled substance (schedules II-V) to another DEA-registered retail pharmacy. Prior to this change, patients would have to go through their practitioner to cancel their prescription and have it re-issued to a different pharmacy. The process was taxing and time consuming for both patients and practitioners.    The Drug Enforcement Administration [facility]) published its intent to revise the process for transferring electronic prescriptions on July 22, 2020.  The final rule was published in the federal register on March 29, 2022 and went into effect 30 days later.  Under the final rule, a prescription can only be transferred once between pharmacies, and only if allowed under existing state or other applicable law. The prescription must remain in its electronic form; may not be altered in any way; and the transfer must be communicated directly between two licensed pharmacists. It's important to note, any authorized refills transfer  with the original prescription, which means the entire prescription will be filled at the same pharmacy".   [email or web address]  ____________________________________________________________________________________________     ____________________________________________________________________________________________  Patient Information update  To: All of our patients.  Re: Name change.  It has been made official that our current name, "[facility]"   will soon be changed to "[facility]".   The purpose of this change is to eliminate any confusion created by the concept of our practice being a "Medication Management Pain Clinic". In the past this has led to the misconception that we treat pain primarily by the use of prescription medications.  Nothing can be farther from the truth.   Understanding PAIN MANAGEMENT: To further understand what our practice does, you first have to understand that "Pain Management" is a subspecialty that requires additional training once a physician has completed their specialty training, which can be in either Anesthesia, Neurology, Psychiatry, or Physical Medicine and Rehabilitation (PMR). Each one of these contributes to the final approach taken by each physician to the management of their patient's pain. To be a "Pain Management Specialist" you must have first completed one of the specialty trainings below.  Anesthesiologists - trained in clinical pharmacology and interventional techniques such as nerve blockade and regional as well as central neuroanatomy. They are trained to block pain before, during, and after surgical interventions.  Neurologists - trained in the diagnosis and pharmacological treatment of complex neurological conditions, such as Multiple  Sclerosis, Parkinson's, spinal cord injuries, and other systemic conditions that may be associated with symptoms that may include but are not limited to pain. They tend to rely primarily on the treatment of chronic pain using prescription medications.  Psychiatrist - trained in conditions affecting the psychosocial wellbeing of patients including but not limited to depression, anxiety, schizophrenia, personality disorders, addiction, and other substance use disorders that may be associated with chronic pain. They tend to rely primarily on the treatment of chronic pain using prescription medications.   Physical Medicine and Rehabilitation (PMR) physicians, also known as physiatrists - trained to treat a wide variety of medical conditions affecting the brain, spinal cord, nerves, bones, joints, ligaments, muscles, and tendons. Their training is primarily aimed at treating patients that have suffered injuries that have caused severe physical impairment. Their training is primarily aimed at the physical therapy and rehabilitation of those patients. They may also work alongside orthopedic surgeons or neurosurgeons using their expertise in assisting surgical patients to recover after their surgeries.  INTERVENTIONAL PAIN MANAGEMENT is sub-subspecialty of Pain Management.  Our physicians are Board-certified in Anesthesia, Pain Management, and Interventional Pain Management.  This meaning that not only have they been trained and Board-certified in their specialty of Anesthesia, and subspecialty of Pain Management, but they have also received further training in the sub-subspecialty of Interventional Pain Management, in order to become Board-certified as INTERVENTIONAL PAIN MANAGEMENT SPECIALIST.    Mission: Our goal is to use our skills in  [place] as alternatives to the chronic use of prescription opioid medications for the treatment of pain. To make this more clear, we have changed our name  to reflect what we do and offer. We will continue to offer medication management assessment and recommendations, but we will not be taking over any patient's medication management.  ____________________________________________________________________________________________     _______________________________________________________________________  Medication Rules  Purpose: To inform patients, and their family members, of our medication rules and regulations.  Applies to: All patients receiving prescriptions from our practice (written or electronic).  Pharmacy of record: This is the  pharmacy where your electronic prescriptions will be sent. Make sure we have the correct one.  Electronic prescriptions: In compliance with the [place] (STOP) Act of 2017 (Session Lanny Cramp [phone]), effective September 03, 2018, all controlled substances must be electronically prescribed. Written prescriptions, faxing, or calling prescriptions to a pharmacy will no longer be done.  Prescription refills: These will be provided only during in-person appointments. No medications will be renewed without a "face-to-face" evaluation with your provider. Applies to all prescriptions.  NOTE: The following applies primarily to controlled substances (Opioid* Pain Medications).   Type of encounter (visit): For patients receiving controlled substances, face-to-face visits are required. (Not an option and not up to the patient.)  Patient's responsibilities: Pain Pills: Bring all pain pills to every appointment (except for procedure appointments). Pill Bottles: Bring pills in original pharmacy bottle. Bring bottle, even if empty. Always bring the bottle of the most recent fill.  Medication refills: You are responsible for knowing and keeping track of what medications you are taking and when is it that you will need a refill. The day before your appointment: write a list of all  prescriptions that need to be refilled. The day of the appointment: give the list to the admitting nurse. Prescriptions will be written only during appointments. No prescriptions will be written on procedure days. If you forget a medication: it will not be "Called in", "Faxed", or "electronically sent". You will need to get another appointment to get these prescribed. No early refills. Do not call asking to have your prescription filled early. Partial  or short prescriptions: Occasionally your pharmacy may not have enough pills to fill your prescription.  NEVER ACCEPT a partial fill or a prescription that is short of the total amount of pills that you were prescribed.  With controlled substances the law allows 72 hours for the pharmacy to complete the prescription.  If the prescription is not completed within 72 hours, the pharmacist will require a new prescription to be written. This means that you will be short on your medicine and we WILL NOT send another prescription to complete your original prescription.  Instead, request the pharmacy to send a carrier to a nearby branch to get enough medication to provide you with your full prescription. Prescription Accuracy: You are responsible for carefully inspecting your prescriptions before leaving our office. Have the discharge nurse carefully go over each prescription with you, before taking them home. Make sure that your name is accurately spelled, that your address is correct. Check the name and dose of your medication to make sure it is accurate. Check the number of pills, and the written instructions to make sure they are clear and accurate. Make sure that you are given enough medication to last until your next medication refill appointment. Taking Medication: Take medication as prescribed. When it comes to controlled substances, taking less pills or less frequently than prescribed is permitted and encouraged. Never take more pills than instructed. Never  take the medication more frequently than prescribed.  Inform other Doctors: Always inform, all of your healthcare providers, of all the medications you take. Pain Medication from other Providers: You are not allowed to accept any additional pain medication from any other Doctor or Healthcare provider. There are two exceptions to this rule. (see below) In the event that you require additional pain medication, you are responsible for notifying us, as stated below. Cough Medicine: Often these contain an opioid, such as codeine or hydrocodone. Never accept  or take cough medicine containing these opioids if you are already taking an opioid* medication. The combination may cause respiratory failure and death. Medication Agreement: You are responsible for carefully reading and following our Medication Agreement. This must be signed before receiving any prescriptions from our practice. Safely store a copy of your signed Agreement. Violations to the Agreement will result in no further prescriptions. (Additional copies of our Medication Agreement are available upon request.) Laws, Rules, & Regulations: All patients are expected to follow all Federal and [employer], Rules, [employer]. Ignorance of the Laws does not constitute a valid excuse.  Illegal drugs and Controlled Substances: The use of illegal substances (including, but not limited to marijuana and its derivatives) and/or the illegal use of any controlled substances is strictly prohibited. Violation of this rule may result in the immediate and permanent discontinuation of any and all prescriptions being written by our practice. The use of any illegal substances is prohibited. Adopted CDC guidelines & recommendations: Target dosing levels will be at or below 60 MME/day. Use of benzodiazepines** is not recommended.  Exceptions: There are only two exceptions to the rule of not receiving pain medications from other Healthcare Providers. Exception #1  (Emergencies): In the event of an emergency (i.e.: accident requiring emergency care), you are allowed to receive additional pain medication. However, you are responsible for: As soon as you are able, call our office [phone], at any time of the day or night, and leave a message stating your name, the date and nature of the emergency, and the name and dose of the medication prescribed. In the event that your call is answered by a member of our staff, make sure to document and save the date, time, and the name of the person that took your information.  Exception #2 (Planned Surgery): In the event that you are scheduled by another doctor or dentist to have any type of surgery or procedure, you are allowed (for a period no longer than 30 days), to receive additional pain medication, for the acute post-op pain. However, in this case, you are responsible for picking up a copy of our "Post-op Pain Management for Surgeons" handout, and giving it to your surgeon or dentist. This document is available at our office, and does not require an appointment to obtain it. Simply go to our office during business hours (Monday-Thursday from 8:00 AM to 4:00 PM) (Friday 8:00 AM to 12:00 Noon) or if you have a scheduled appointment with [place], prior to your surgery, and ask for it by name. In addition, you are responsible for: calling our office [phone], at any time of the day or night, and leaving a message stating your name, name of your surgeon, type of surgery, and date of procedure or surgery. Failure to comply with your responsibilities may result in termination of therapy involving the controlled substances. Medication Agreement Violation. Following the above rules, including your responsibilities will help you in avoiding a Medication Agreement Violation ("Breaking your Pain Medication Contract").  Consequences:  Not following the above rules may result in permanent discontinuation of medication prescription  therapy.  *Opioid medications include: morphine, codeine, oxycodone, oxymorphone, hydrocodone, hydromorphone, meperidine, tramadol, tapentadol, buprenorphine, fentanyl, methadone. **Benzodiazepine medications include: diazepam (Valium), alprazolam (Xanax), clonazepam (Klonopine), lorazepam (Ativan), clorazepate (Tranxene), chlordiazepoxide (Librium), estazolam (Prosom), oxazepam (Serax), temazepam (Restoril), triazolam (Halcion) (Last updated: 06/26/2022) ______________________________________________________________________    ______________________________________________________________________  Medication Recommendations and Reminders  Applies to: All patients receiving prescriptions (written and/or electronic).  Medication Rules &  Regulations: You are responsible for reading, knowing, and following our "Medication Rules" document. These exist for your safety and that of others. They are not flexible and neither are we. Dismissing or ignoring them is an act of "non-compliance" that may result in complete and irreversible termination of such medication therapy. For safety reasons, "non-compliance" will not be tolerated. As with the [place] fundamental legal principle of "ignorance of the law is no defense", we will accept no excuses for not having read and knowing the content of documents provided to you by our practice.  Pharmacy of record:  Definition: This is the pharmacy where your electronic prescriptions will be sent.  We do not endorse any particular pharmacy. It is up to you and your insurance to decide what pharmacy to use.  We do not restrict you in your choice of pharmacy. However, once we write for your prescriptions, we will NOT be re-sending more prescriptions to fix restricted supply problems created by your pharmacy, or your insurance.  The pharmacy listed in the electronic medical record should be the one where you want electronic prescriptions to be sent. If you choose to  change pharmacy, simply notify our nursing staff. Changes will be made only during your regular appointments and not over the phone.  Recommendations: Keep all of your pain medications in a safe place, under lock and key, even if you live alone. We will NOT replace lost, stolen, or damaged medication. We do not accept "Police Reports" as proof of medications having been stolen. After you fill your prescription, take 1 week's worth of pills and put them away in a safe place. You should keep a separate, properly labeled bottle for this purpose. The remainder should be kept in the original bottle. Use this as your primary supply, until it runs out. Once it's gone, then you know that you have 1 week's worth of medicine, and it is time to come in for a prescription refill. If you do this correctly, it is unlikely that you will ever run out of medicine. To make sure that the above recommendation works, it is very important that you make sure your medication refill appointments are scheduled at least 1 week before you run out of medicine. To do this in an effective manner, make sure that you do not leave the office without scheduling your next medication management appointment. Always ask the nursing staff to show you in your prescription , when your medication will be running out. Then arrange for the receptionist to get you a return appointment, at least 7 days before you run out of medicine. Do not wait until you have 1 or 2 pills left, to come in. This is very poor planning and does not take into consideration that we may need to cancel appointments due to bad weather, sickness, or emergencies affecting our staff. DO NOT ACCEPT A "Partial Fill": If for any reason your pharmacy does not have enough pills/tablets to completely fill or refill your prescription, do not allow for a "partial fill". The law allows the pharmacy to complete that prescription within 72 hours, without requiring a new prescription. If they  do not fill the rest of your prescription within those 72 hours, you will need a separate prescription to fill the remaining amount, which we will NOT provide. If the reason for the partial fill is your insurance, you will need to talk to the pharmacist about payment alternatives for the remaining tablets, but again, DO NOT ACCEPT A PARTIAL FILL,  unless you can trust your pharmacist to obtain the remainder of the pills within 72 hours.  Prescription refills and/or changes in medication(s):  Prescription refills, and/or changes in dose or medication, will be conducted only during scheduled medication management appointments. (Applies to both, written and electronic prescriptions.) No refills on procedure days. No medication will be changed or started on procedure days. No changes, adjustments, and/or refills will be conducted on a procedure day. Doing so will interfere with the diagnostic portion of the procedure. No phone refills. No medications will be "called into the pharmacy". No Fax refills. No weekend refills. No Holliday refills. No after hours refills.  Remember:  Business hours are:  Monday to Thursday 8:00 AM to 4:00 PM Provider's Schedule: Milinda Pointer, MD - Appointments are:  Medication management: Monday and Wednesday 8:00 AM to 4:00 PM Procedure day: Tuesday and Thursday 7:30 AM to 4:00 PM Gillis Santa, MD - Appointments are:  Medication management: Tuesday and Thursday 8:00 AM to 4:00 PM Procedure day: Monday and Wednesday 7:30 AM to 4:00 PM (Last update: 06/26/2022) ______________________________________________________________________    ____________________________________________________________________________________________  Drug Holidays  What is a "Drug Holiday"? Drug Holiday: is the name given to the process of slowly tapering down and temporarily stopping the pain medication for the purpose of decreasing or eliminating tolerance to the  drug.  Benefits Improved effectiveness Decreased required effective dose Improved pain control End dependence on high dose therapy Decrease cost of therapy Uncovering "opioid-induced hyperalgesia". (OIH)  What is "opioid hyperalgesia"? It is a paradoxical increase in pain caused by exposure to opioids. Stopping the opioid pain medication, contrary to the expected, it actually decreases or completely eliminates the pain. Ref.: "A comprehensive review of opioid-induced hyperalgesia". Brion Aliment, et.al. Pain Physician. 2011 Mar-Apr;14(2):145-61.  What is tolerance? Tolerance: the progressive loss of effectiveness of a pain medicine due to repetitive use. A common problem of opioid pain medications.  How long should a "Drug Holiday" last? Effectiveness depends on the patient staying off all opioid pain medicines for a minimum of 14 consecutive days. (2 weeks)  How about just taking less of the medicine? Does not work. Will not accomplish goal of eliminating the excess receptors.  How about switching to a different pain medicine? (AKA. "Opioid rotation") Does not work. Creates the illusion of effectiveness by taking advantage of inaccurate equivalent dose calculations between different opioids. -This "technique" was promoted by studies funded by [employer], such as [employer], creators of "OxyContin".  Can I stop the medicine "cold [place]"? Depends. You should always coordinate with your Pain Specialist to make the transition as smoothly as possible. Avoid stopping the medicine abruptly without consulting. We recommend a "slow taper".  What is a slow taper? Taper: refers to the gradual decrease in dose.   How do I stop/taper the dose? Slowly. Decrease the daily amount of pills that you take by one (1) pill every seven (7) days. This is called a "slow downward taper". Example: if you normally take four (4) pills per day, drop it to three (3) pills per day for seven (7)  days, then to two (2) pills per day for seven (7) days, then to one (1) per day for seven (7) days, and then stop the medicine. The 14 day "Drug Holiday" starts on the first day without medicine.   Will I experience withdrawals? Unlikely with a slow taper.  What triggers withdrawals? Withdrawals are triggered by the sudden/abrupt stop of high dose opioids. Withdrawals can be avoided by  slowly decreasing the dose over a prolonged period of time.  What are withdrawals? Symptoms associated with sudden/abrupt reduction/stopping of high-dose, long-term use of pain medication. Withdrawal are seldom seen on low dose therapy, or patients rarely taking opioid medication.  Early Withdrawal Symptoms may include: Agitation Anxiety Muscle aches Increased tearing Insomnia Runny nose Sweating Yawning  Late symptoms may include: Abdominal cramping Diarrhea Dilated pupils Goose bumps Nausea Vomiting  (Last update: 08/12/2022) ____________________________________________________________________________________________    ____________________________________________________________________________________________  CBD (cannabidiol) & Delta-8 (Delta-8 tetrahydrocannabinol) WARNING  Intro: Cannabidiol (CBD) and tetrahydrocannabinol (THC), are two natural compounds found in plants of the Cannabis genus. They can both be extracted from hemp or cannabis. Hemp and cannabis come from the Cannabis sativa plant. Both compounds interact with your body's endocannabinoid system, but they have very different effects. CBD does not produce the high sensation associated with cannabis. Delta-8 tetrahydrocannabinol, also known as delta-8 THC, is a psychoactive substance found in the Cannabis sativa plant, of which marijuana and hemp are two varieties. THC is responsible for the high associated with the illicit use of marijuana.  Applicable to: All individuals currently taking or considering taking CBD  (cannabidiol) and, more important, all patients taking opioid analgesic controlled substances (pain medication). (Example: oxycodone; oxymorphone; hydrocodone; hydromorphone; morphine; methadone; tramadol; tapentadol; fentanyl; buprenorphine; butorphanol; dextromethorphan; meperidine; codeine; etc.)  Legal status: CBD remains a Schedule I drug prohibited for any use. CBD is illegal with one exception. In the [place], CBD has a limited Transport planner (FDA) approval for the treatment of two specific types of epilepsy disorders. Only one CBD product has been approved by the FDA for this purpose: "Epidiolex". FDA is aware that some companies are marketing products containing cannabis and cannabis-derived compounds in ways that violate the [employer], Drug and Cosmetic Act [facility] Act) and that may put the health and safety of consumers at risk. The FDA, a Federal agency, has not enforced the CBD status since 2018. UPDATE: (10/20/2021) The Drug Enforcement Agency ([place]) issued a letter stating that "delta" cannabinoids, including Delta-8-THCO and Delta-9-THCO, synthetically derived from hemp do not qualify as hemp and will be viewed as Schedule I drugs. (Schedule I drugs, substances, or chemicals are defined as drugs with no currently accepted medical use and a high potential for abuse. Some examples of Schedule I drugs are: heroin, lysergic acid diethylamide (LSD), marijuana (cannabis), 3,4-methylenedioxymethamphetamine (ecstasy), methaqualone, and peyote.) (https://jennings.com/)  Legality: Some manufacturers ship CBD products nationally, which is illegal. Often such products are sold online and are therefore available throughout the country. CBD is openly sold in head shops and health food stores in some states where such sales have not been explicitly legalized. Selling unapproved products with unsubstantiated therapeutic claims is not only a violation of the law, but also can put patients at  risk, as these products have not been proven to be safe or effective. Federal illegality makes it difficult to conduct research on CBD.  Reference: "FDA Regulation of Cannabis and Cannabis-Derived Products, Including Cannabidiol (CBD)" - SeekArt[employer].pt  Warning: CBD is not FDA approved and has not undergo the same manufacturing controls as prescription drugs.  This means that the purity and safety of available CBD may be questionable. Most of the time, despite manufacturer's claims, it is contaminated with THC (delta-9-tetrahydrocannabinol - the chemical in marijuana responsible for the "HIGH").  When this is the case, the [facility] contaminant will trigger a positive urine drug screen (UDS) test for Marijuana (carboxy-THC). Because a positive UDS for any illicit  substance is a violation of our medication agreement, your opioid analgesics (pain medicine) may be permanently discontinued. The FDA recently put out a warning about 5 things that everyone should be aware of regarding Delta-8 THC: Delta-8 THC products have not been evaluated or approved by the FDA for safe use and may be marketed in ways that put the public health at risk. The FDA has received adverse event reports involving delta-8 THC-containing products. Delta-8 THC has psychoactive and intoxicating effects. Delta-8 THC manufacturing often involve use of potentially harmful chemicals to create the concentrations of delta-8 THC claimed in the marketplace. The final delta-8 THC product may have potentially harmful by-products (contaminants) due to the chemicals used in the process. Manufacturing of delta-8 THC products may occur in uncontrolled or unsanitary settings, which may lead to the presence of unsafe contaminants or other potentially harmful substances. Delta-8 THC products should be kept out of the reach of children and  pets.  MORE ABOUT CBD  General Information: CBD was discovered in [birth year] and it is a derivative of the cannabis sativa genus plants (Marijuana and Hemp). It is one of the 113 identified substances found in Marijuana. It accounts for up to 40% of the plant's extract. As of 2018, preliminary clinical studies on CBD included research for the treatment of anxiety, movement disorders, and pain. CBD is available and consumed in multiple forms, including inhalation of smoke or vapor, as an aerosol spray, and by mouth. It may be supplied as an oil containing CBD, capsules, dried cannabis, or as a liquid solution. CBD is thought not to be as psychoactive as THC (delta-9-tetrahydrocannabinol - the chemical in marijuana responsible for the "HIGH"). Studies suggest that CBD may interact with different biological target receptors in the body, including cannabinoid and other neurotransmitter receptors. As of 2018 the mechanism of action for its biological effects has not been determined.  Side-effects  Adverse reactions: Dry mouth, diarrhea, decreased appetite, fatigue, drowsiness, malaise, weakness, sleep disturbances, and others.  Drug interactions: CBC may interact with other medications such as blood-thinners. Because CBD causes drowsiness on its own, it also increases the drowsiness caused by other medications, including antihistamines (such as Benadryl), benzodiazepines (Xanax, Ativan, Valium), antipsychotics, antidepressants and opioids, as well as alcohol and supplements such as kava, melatonin and [place]'s Wort. Be cautious with the following combinations:   Brivaracetam (Briviact) Brivaracetam is changed and broken down by the body. CBD might decrease how quickly the body breaks down brivaracetam. This might increase levels of brivaracetam in the body.  Caffeine Caffeine is changed and broken down by the body. CBD might decrease how quickly the body breaks down caffeine. This might increase levels of  caffeine in the body.  Carbamazepine (Tegretol) Carbamazepine is changed and broken down by the body. CBD might decrease how quickly the body breaks down carbamazepine. This might increase levels of carbamazepine in the body and increase its side effects.  Citalopram (Celexa) Citalopram is changed and broken down by the body. CBD might decrease how quickly the body breaks down citalopram. This might increase levels of citalopram in the body and increase its side effects.  Clobazam (Onfi) Clobazam is changed and broken down by the liver. CBD might decrease how quickly the liver breaks down clobazam. This might increase the effects and side effects of clobazam.  Eslicarbazepine (Aptiom) Eslicarbazepine is changed and broken down by the body. CBD might decrease how quickly the body breaks down eslicarbazepine. This might increase levels of eslicarbazepine in the body by  a small amount.  Everolimus (Zostress) Everolimus is changed and broken down by the body. CBD might decrease how quickly the body breaks down everolimus. This might increase levels of everolimus in the body.  Lithium Taking higher doses of CBD might increase levels of lithium. This can increase the risk of lithium toxicity.  Medications changed by the liver (Cytochrome P450 1A1 (CYP1A1) substrates) Some medications are changed and broken down by the liver. CBD might change how quickly the liver breaks down these medications. This could change the effects and side effects of these medications.  Medications changed by the liver (Cytochrome P450 1A2 (CYP1A2) substrates) Some medications are changed and broken down by the liver. CBD might change how quickly the liver breaks down these medications. This could change the effects and side effects of these medications.  Medications changed by the liver (Cytochrome P450 1B1 (CYP1B1) substrates) Some medications are changed and broken down by the liver. CBD might change how quickly the  liver breaks down these medications. This could change the effects and side effects of these medications.  Medications changed by the liver (Cytochrome P450 2A6 (CYP2A6) substrates) Some medications are changed and broken down by the liver. CBD might change how quickly the liver breaks down these medications. This could change the effects and side effects of these medications.  Medications changed by the liver (Cytochrome P450 2B6 (CYP2B6) substrates) Some medications are changed and broken down by the liver. CBD might change how quickly the liver breaks down these medications. This could change the effects and side effects of these medications.  Medications changed by the liver (Cytochrome P450 2C19 (CYP2C19) substrates) Some medications are changed and broken down by the liver. CBD might change how quickly the liver breaks down these medications. This could change the effects and side effects of these medications.  Medications changed by the liver (Cytochrome P450 2C8 (CYP2C8) substrates) Some medications are changed and broken down by the liver. CBD might change how quickly the liver breaks down these medications. This could change the effects and side effects of these medications.  Medications changed by the liver (Cytochrome P450 2C9 (CYP2C9) substrates) Some medications are changed and broken down by the liver. CBD might change how quickly the liver breaks down these medications. This could change the effects and side effects of these medications.  Medications changed by the liver (Cytochrome P450 2D6 (CYP2D6) substrates) Some medications are changed and broken down by the liver. CBD might change how quickly the liver breaks down these medications. This could change the effects and side effects of these medications.  Medications changed by the liver (Cytochrome P450 2E1 (CYP2E1) substrates) Some medications are changed and broken down by the liver. CBD might change how quickly the liver  breaks down these medications. This could change the effects and side effects of these medications.  Medications changed by the liver (Cytochrome P450 3A4 (CYP3A4) substrates) Some medications are changed and broken down by the liver. CBD might change how quickly the liver breaks down these medications. This could change the effects and side effects of these medications.  Medications changed by the liver (Glucuronidated drugs) Some medications are changed and broken down by the liver. CBD might change how quickly the liver breaks down these medications. This could change the effects and side effects of these medications.  Medications that decrease the breakdown of other medications by the liver (Cytochrome P450 2C19 (CYP2C19) inhibitors) CBD is changed and broken down by the liver. Some drugs decrease how quickly  the liver changes and breaks down CBD. This could change the effects and side effects of CBD.  Medications that decrease the breakdown of other medications in the liver (Cytochrome P450 3A4 (CYP3A4) inhibitors) CBD is changed and broken down by the liver. Some drugs decrease how quickly the liver changes and breaks down CBD. This could change the effects and side effects of CBD.  Medications that increase breakdown of other medications by the liver (Cytochrome P450 3A4 (CYP3A4) inducers) CBD is changed and broken down by the liver. Some drugs increase how quickly the liver changes and breaks down CBD. This could change the effects and side effects of CBD.  Medications that increase the breakdown of other medications by the liver (Cytochrome P450 2C19 (CYP2C19) inducers) CBD is changed and broken down by the liver. Some drugs increase how quickly the liver changes and breaks down CBD. This could change the effects and side effects of CBD.  Methadone (Dolophine) Methadone is broken down by the liver. CBD might decrease how quickly the liver breaks down methadone. Taking cannabidiol along  with methadone might increase the effects and side effects of methadone.  Rufinamide (Banzel) Rufinamide is changed and broken down by the body. CBD might decrease how quickly the body breaks down rufinamide. This might increase levels of rufinamide in the body by a small amount.  Sedative medications (CNS depressants) CBD might cause sleepiness and slowed breathing. Some medications, called sedatives, can also cause sleepiness and slowed breathing. Taking CBD with sedative medications might cause breathing problems and/or too much sleepiness.  Sirolimus (Rapamune) Sirolimus is changed and broken down by the body. CBD might decrease how quickly the body breaks down sirolimus. This might increase levels of sirolimus in the body.  Stiripentol (Diacomit) Stiripentol is changed and broken down by the body. CBD might decrease how quickly the body breaks down stiripentol. This might increase levels of stiripentol in the body and increase its side effects.  Tacrolimus (Prograf) Tacrolimus is changed and broken down by the body. CBD might decrease how quickly the body breaks down tacrolimus. This might increase levels of tacrolimus in the body.  Tamoxifen (Soltamox) Tamoxifen is changed and broken down by the body. CBD might affect how quickly the body breaks down tamoxifen. This might affect levels of tamoxifen in the body.  Topiramate (Topamax) Topiramate is changed and broken down by the body. CBD might decrease how quickly the body breaks down topiramate. This might increase levels of topiramate in the body by a small amount.  Valproate Valproic acid can cause liver injury. Taking cannabidiol with valproic acid might increase the chance of liver injury. CBD and/or valproic acid might need to be stopped, or the dose might need to be reduced.  Warfarin (Coumadin) CBD might increase levels of warfarin, which can increase the risk for bleeding. CBD and/or warfarin might need to be stopped, or the  dose might need to be reduced.  Zonisamide Zonisamide is changed and broken down by the body. CBD might decrease how quickly the body breaks down zonisamide. This might increase levels of zonisamide in the body by a small amount. (Last update: 11/01/2021) ____________________________________________________________________________________________   ____________________________________________________________________________________________  Naloxone Nasal Spray  Why am I receiving this medication? [place] STOP ACT requires that all patients taking high dose opioids or at risk of opioids respiratory depression, be prescribed an opioid reversal agent, such as Naloxone (AKA: Narcan).  What is this medication? NALOXONE (nal OX one) treats opioid overdose, which causes slow or shallow breathing,  severe drowsiness, or trouble staying awake. Call emergency services after using this medication. You may need additional treatment. Naloxone works by reversing the effects of opioids. It belongs to a group of medications called opioid blockers.  COMMON BRAND NAME(S): Kloxxado, Narcan  What should I tell my care team before I take this medication? They need to know if you have any of these conditions: Heart disease Substance use disorder An unusual or allergic reaction to naloxone, other medications, foods, dyes, or preservatives Pregnant or trying to get pregnant Breast-feeding  When to use this medication? This medication is to be used for the treatment of respiratory depression (less than 8 breaths per minute) secondary to opioid overdose.   How to use this medication? This medication is for use in the nose. Lay the person on their back. Support their neck with your hand and allow the head to tilt back before giving the medication. The nasal spray should be given into 1 nostril. After giving the medication, move the person onto their side. Do not remove or test the nasal spray until ready  to use. Get emergency medical help right away after giving the first dose of this medication, even if the person wakes up. You should be familiar with how to recognize the signs and symptoms of a narcotic overdose. If more doses are needed, give the additional dose in the other nostril. Talk to your care team about the use of this medication in children. While this medication may be prescribed for children as young as newborns for selected conditions, precautions do apply.  Naloxone Overdosage: If you think you have taken too much of this medicine contact a poison control center or emergency room at once.  NOTE: This medicine is only for you. Do not share this medicine with others.  What if I miss a dose? This does not apply.  What may interact with this medication? This is only used during an emergency. No interactions are expected during emergency use. This list may not describe all possible interactions. Give your health care provider a list of all the medicines, herbs, non-prescription drugs, or dietary supplements you use. Also tell them if you smoke, drink alcohol, or use illegal drugs. Some items may interact with your medicine.  What should I watch for while using this medication? Keep this medication ready for use in the case of an opioid overdose. Make sure that you have the phone number of your care team and local hospital ready. You may need to have additional doses of this medication. Each nasal spray contains a single dose. Some emergencies may require additional doses. After use, bring the treated person to the nearest hospital or call 911. Make sure the treating care team knows that the person has received a dose of this medication. You will receive additional instructions on what to do during and after use of this medication before an emergency occurs.  What side effects may I notice from receiving this medication? Side effects that you should report to your care team as soon as  possible: Allergic reactions--skin rash, itching, hives, swelling of the face, lips, tongue, or throat Side effects that usually do not require medical attention (report these to your care team if they continue or are bothersome): Constipation Dryness or irritation inside the nose Headache Increase in blood pressure Muscle spasms Stuffy nose Toothache This list may not describe all possible side effects. Call your doctor for medical advice about side effects. You may report side effects to FDA  at 1-800-FDA-1088.  Where should I keep my medication? Because this is an emergency medication, you should keep it with you at all times.  Keep out of the reach of children and pets. Store between 20 and 25 degrees C (68 and 77 degrees F). Do not freeze. Throw away any unused medication after the expiration date. Keep in original box until ready to use.  NOTE: This sheet is a summary. It may not cover all possible information. If you have questions about this medicine, talk to your doctor, pharmacist, or health care provider.   2023 Elsevier/Gold Standard (2021-04-28 00:00:00)  ____________________________________________________________________________________________

## 2022-08-13 ENCOUNTER — Encounter: Payer: Self-pay | Admitting: Pain Medicine

## 2022-08-13 ENCOUNTER — Ambulatory Visit: Payer: Medicare Other | Attending: Pain Medicine | Admitting: Pain Medicine

## 2022-08-13 VITALS — BP 128/74 | HR 98 | Temp 97.1°F | Resp 16 | Ht 63.0 in | Wt 163.0 lb

## 2022-08-13 DIAGNOSIS — M25511 Pain in right shoulder: Secondary | ICD-10-CM | POA: Diagnosis present

## 2022-08-13 DIAGNOSIS — G894 Chronic pain syndrome: Secondary | ICD-10-CM | POA: Diagnosis present

## 2022-08-13 DIAGNOSIS — M79605 Pain in left leg: Secondary | ICD-10-CM | POA: Insufficient documentation

## 2022-08-13 DIAGNOSIS — G8929 Other chronic pain: Secondary | ICD-10-CM | POA: Insufficient documentation

## 2022-08-13 DIAGNOSIS — Z79899 Other long term (current) drug therapy: Secondary | ICD-10-CM | POA: Diagnosis present

## 2022-08-13 DIAGNOSIS — R0789 Other chest pain: Secondary | ICD-10-CM | POA: Insufficient documentation

## 2022-08-13 DIAGNOSIS — Z79891 Long term (current) use of opiate analgesic: Secondary | ICD-10-CM | POA: Diagnosis present

## 2022-08-13 DIAGNOSIS — M79604 Pain in right leg: Secondary | ICD-10-CM | POA: Insufficient documentation

## 2022-08-13 DIAGNOSIS — I200 Unstable angina: Secondary | ICD-10-CM

## 2022-08-13 DIAGNOSIS — M545 Low back pain, unspecified: Secondary | ICD-10-CM | POA: Insufficient documentation

## 2022-08-13 DIAGNOSIS — M47816 Spondylosis without myelopathy or radiculopathy, lumbar region: Secondary | ICD-10-CM | POA: Insufficient documentation

## 2022-08-13 MED ORDER — HYDROCODONE-ACETAMINOPHEN 5-325 MG PO TABS: 1.0000 | ORAL_TABLET | Freq: Two times a day (BID) | ORAL | 0 refills | Status: DC | PRN

## 2022-08-13 MED ORDER — NALOXONE HCL 4 MG/0.1ML NA LIQD: 1.0000 | NASAL | 0 refills | Status: AC | PRN

## 2022-08-13 NOTE — Progress Notes (Signed)
Nursing Pain Medication Assessment:  Safety precautions to be maintained throughout the outpatient stay will include: orient to surroundings, keep bed in low position, maintain call bell within reach at all times, provide assistance with transfer out of bed and ambulation.  Medication Inspection Compliance: Pill count conducted under aseptic conditions, in front of the patient. Neither the pills nor the bottle was removed from the patient's sight at any time. Once count was completed pills were immediately returned to the patient in their original bottle.  Medication: Hydrocodone/APAP Pill/Patch Count:  22 of 60 pills remain Pill/Patch Appearance: Markings consistent with prescribed medication Bottle Appearance: Standard pharmacy container. Clearly labeled. Filled Date: 43 / 21 / 2023 Last Medication intake:  Today

## 2022-10-29 ENCOUNTER — Other Ambulatory Visit (INDEPENDENT_AMBULATORY_CARE_PROVIDER_SITE_OTHER): Payer: Self-pay | Admitting: Vascular Surgery

## 2022-10-29 DIAGNOSIS — Z9889 Other specified postprocedural states: Secondary | ICD-10-CM

## 2022-11-02 ENCOUNTER — Ambulatory Visit (INDEPENDENT_AMBULATORY_CARE_PROVIDER_SITE_OTHER): Payer: Medicare Other | Admitting: Nurse Practitioner

## 2022-11-02 ENCOUNTER — Encounter (INDEPENDENT_AMBULATORY_CARE_PROVIDER_SITE_OTHER): Payer: Medicare Other

## 2022-11-02 ENCOUNTER — Ambulatory Visit (INDEPENDENT_AMBULATORY_CARE_PROVIDER_SITE_OTHER): Payer: Medicare Other

## 2022-11-02 ENCOUNTER — Encounter (INDEPENDENT_AMBULATORY_CARE_PROVIDER_SITE_OTHER): Payer: Self-pay | Admitting: Nurse Practitioner

## 2022-11-02 VITALS — BP 167/80 | HR 69 | Resp 18 | Ht 63.0 in | Wt 167.4 lb

## 2022-11-02 DIAGNOSIS — E785 Hyperlipidemia, unspecified: Secondary | ICD-10-CM

## 2022-11-02 DIAGNOSIS — I739 Peripheral vascular disease, unspecified: Secondary | ICD-10-CM

## 2022-11-02 DIAGNOSIS — Z9889 Other specified postprocedural states: Secondary | ICD-10-CM | POA: Diagnosis not present

## 2022-11-02 DIAGNOSIS — I10 Essential (primary) hypertension: Secondary | ICD-10-CM | POA: Diagnosis not present

## 2022-11-02 MED ORDER — CLOPIDOGREL BISULFATE 75 MG PO TABS: 75.0000 mg | ORAL_TABLET | Freq: Every day | ORAL | 11 refills | Status: DC

## 2022-11-05 LAB — VAS US ABI WITH/WO TBI
Left ABI: 0.75
Right ABI: 0.9

## 2022-11-12 ENCOUNTER — Encounter: Payer: Self-pay | Admitting: Pain Medicine

## 2022-11-12 ENCOUNTER — Ambulatory Visit: Payer: Medicare Other | Attending: Pain Medicine | Admitting: Pain Medicine

## 2022-11-12 DIAGNOSIS — G894 Chronic pain syndrome: Secondary | ICD-10-CM | POA: Diagnosis present

## 2022-11-12 DIAGNOSIS — R0789 Other chest pain: Secondary | ICD-10-CM | POA: Insufficient documentation

## 2022-11-12 DIAGNOSIS — M47816 Spondylosis without myelopathy or radiculopathy, lumbar region: Secondary | ICD-10-CM | POA: Insufficient documentation

## 2022-11-12 DIAGNOSIS — M79605 Pain in left leg: Secondary | ICD-10-CM | POA: Insufficient documentation

## 2022-11-12 DIAGNOSIS — Z79891 Long term (current) use of opiate analgesic: Secondary | ICD-10-CM | POA: Insufficient documentation

## 2022-11-12 DIAGNOSIS — G8929 Other chronic pain: Secondary | ICD-10-CM | POA: Diagnosis present

## 2022-11-12 DIAGNOSIS — Z79899 Other long term (current) drug therapy: Secondary | ICD-10-CM | POA: Insufficient documentation

## 2022-11-12 DIAGNOSIS — M545 Low back pain, unspecified: Secondary | ICD-10-CM | POA: Insufficient documentation

## 2022-11-12 DIAGNOSIS — M25511 Pain in right shoulder: Secondary | ICD-10-CM | POA: Diagnosis present

## 2022-11-12 DIAGNOSIS — M79604 Pain in right leg: Secondary | ICD-10-CM | POA: Insufficient documentation

## 2022-11-12 MED ORDER — HYDROCODONE-ACETAMINOPHEN 5-325 MG PO TABS: 1.0000 | ORAL_TABLET | Freq: Two times a day (BID) | ORAL | 0 refills | Status: DC | PRN

## 2022-11-12 NOTE — Progress Notes (Signed)
Nursing Pain Medication Assessment:  Safety precautions to be maintained throughout the outpatient stay will include: orient to surroundings, keep bed in low position, maintain call bell within reach at all times, provide assistance with transfer out of bed and ambulation.  Medication Inspection Compliance: Pill count conducted under aseptic conditions, in front of the patient. Neither the pills nor the bottle was removed from the patient's sight at any time. Once count was completed pills were immediately returned to the patient in their original bottle.  Medication: Hydrocodone/APAP Pill/Patch Count:  20 of 60 pills remain Pill/Patch Appearance: Markings consistent with prescribed medication Bottle Appearance: Standard pharmacy container. Clearly labeled. Filled Date: 02 / 19 / 2024 Last Medication intake:  Today

## 2022-11-12 NOTE — Progress Notes (Signed)
PROVIDER NOTE: Information contained herein reflects review and annotations entered in association with encounter. Interpretation of such information and data should be left to medically-trained personnel. Information provided to patient can be located elsewhere in the medical record under "Patient Instructions". Document created using STT-dictation technology, any transcriptional errors that may result from process are unintentional.    Patient: [name] [name].  Service Category: E/M  Provider: Gaspar Cola, MD  DOB: Jun 17, 1956  DOS: 11/12/2022  Referring Provider: Ranae Plumber, [place]  MRN: [id number]  Specialty: Interventional Pain Management  PCP: Ranae Plumber, PA  Type: Established Patient  Setting: Ambulatory outpatient    Location: Office  Delivery: Face-to-face     HPI  [name], a 67 y.o. year old male, is here today because of his No primary diagnosis found.. [name] primary complain today is sternum (Right leg pain)  Pertinent problems: [name] has Chronic shoulder pain (3ry area of Pain) (Right); Chronic hip pain (Left); Chronic chest wall pain (1ry area of Pain) (Incisional Midline) (since 04/22/2012); Chronic lower extremity pain (2ry area of Pain) (Bilateral) (R>L); Chronic sacroiliac joint pain (Bilateral) (L>R); Neuropathic pain; Neurogenic pain; Incisional pain; Lumbar facet syndrome (Bilateral) (R>L); Lumbar spondylosis; Keloid skin disorder; Costochondritis; Brachial plexus injury, right; Right sided weakness; Chronic pain syndrome; DDD (degenerative disc disease), lumbosacral; Lumbar facet arthropathy (Multilevel) (Bilateral); 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 Pain Assessment: Severity of Chronic pain  is reported as a 3 /10. Location: Sternum  /denies. Onset: More than a month ago. Quality: Constant. Timing: Constant. Modifying factor(s): meds. Vitals:  height is '5\' 3"'$  (1.6 m) and weight is 165 lb (74.8 kg). His temporal temperature is 97.3 F (36.3 C) (abnormal). His blood pressure is 152/73 (abnormal) and his pulse is 73. His oxygen saturation is 96%.  BMI: Estimated body mass index is 29.23 kg/m as calculated from the following:   Height as of this encounter: '5\' 3"'$  (1.6 m).   Weight as of this encounter: 165 lb (74.8 kg). Last encounter: 08/13/2022. Last procedure: 05/13/2018.  Reason for encounter: medication management.  The patient indicates doing well with the current medication regimen. No adverse reactions or side effects reported to the medications.   According to the PMP 3/3 prescriptions written on 08/13/2022 have been filled with the last 1 having been filled on 10/22/2022.  RTCB: 02/19/2023   Pharmacotherapy Assessment  Analgesic: Hydrocodone/APAP 5/325 mg, 1 tab PO BID (10 mg/day of hydrocodone) MME/day: 10 mg/day.   Monitoring: [place] PMP: PDMP reviewed during this encounter.       Pharmacotherapy: No side-effects or adverse reactions reported. Compliance: No problems identified. Effectiveness: Clinically acceptable.  Arlice Colt, RN  11/12/2022  1:01 PM  Sign when Signing Visit Nursing Pain Medication Assessment:  Safety precautions to be maintained throughout the outpatient stay will include: orient to surroundings, keep bed in low position, maintain call bell within reach at all times, provide assistance with transfer out of bed and ambulation.  Medication Inspection Compliance: Pill count conducted under aseptic conditions, in front of the patient. Neither the pills nor the bottle was removed from the patient's sight at any time. Once count was completed pills were immediately returned to the patient in their original bottle.  Medication:  Hydrocodone/APAP Pill/Patch Count:  20 of 60  pills remain Pill/Patch Appearance: Markings consistent with prescribed medication Bottle Appearance: Standard pharmacy container. Clearly labeled. Filled Date: 02 / 19 / 2024 Last Medication intake:  Today    No results found for: "CBDTHCR" No results found for: "D8THCCBX" No results found for: "D9THCCBX"  UDS:  Summary  Date Value Ref Range Status  05/21/2022 Note  Final    Comment:    ==================================================================== [facility] Select 13 (MW) ==================================================================== Test                             Result       Flag       Units  Drug Present and Declared for Prescription Verification   Hydrocodone                    1642         EXPECTED   ng/mg creat   Hydromorphone                  324          EXPECTED   ng/mg creat   Dihydrocodeine                 62           EXPECTED   ng/mg creat   Norhydrocodone                 1263         EXPECTED   ng/mg creat    Sources of hydrocodone include scheduled prescription medications.    Hydromorphone, dihydrocodeine and norhydrocodone are expected    metabolites of hydrocodone. Hydromorphone and dihydrocodeine are    also available as scheduled prescription medications.  ==================================================================== Test                      Result    Flag   Units      Ref Range   Creatinine              307              mg/dL      >=20 ==================================================================== Declared Medications:  The flagging and interpretation on this report are based on the  following declared medications.  Unexpected results may arise from  inaccuracies in the declared medications.   **Note: The testing scope of this panel includes these medications:   Hydrocodone (Norco)   **Note: The testing scope of this panel does not include the  following reported  medications:   Acetaminophen (Norco)  Allopurinol (Zyloprim)  Aspirin  Atorvastatin (Lipitor)  Cholecalciferol  Clopidogrel (Plavix)  Gabapentin (Neurontin)  Insulin (Levemir)  Isosorbide (Imdur)  Losartan (Cozaar)  Meloxicam (Mobic)  Metoprolol (Lopressor)  Multivitamin  Nitroglycerin (Nitrostat)  Pantoprazole (Protonix)  Potassium  Semaglutide (Ozempic) ==================================================================== For clinical consultation, please call [phone]. ====================================================================       ROS  Constitutional: Denies any fever or chills Gastrointestinal: No reported hemesis, hematochezia, vomiting, or acute GI distress Musculoskeletal: Denies any acute onset joint swelling, redness, loss of ROM, or weakness Neurological: No reported episodes of acute onset apraxia, aphasia, dysarthria, agnosia, amnesia, paralysis, loss of coordination, or loss of consciousness  Medication Review  Cholecalciferol, HYDROcodone-acetaminophen, Insulin Pen Needle, Potassium, Semaglutide (1 MG/DOSE), allopurinol, aspirin EC, atorvastatin, clopidogrel, gabapentin, insulin degludec, isosorbide mononitrate, losartan, metoprolol tartrate, multivitamin with minerals, naloxone, nitroGLYCERIN, pantoprazole, and valsartan  History Review  Allergy: [name] [name] has  No Known Allergies. Drug: [name] [name]  reports no history of drug use. Alcohol:  reports current alcohol use of about 14.0 standard drinks of alcohol per week. Tobacco:  reports that he has quit smoking. His smoking use included cigarettes. He has quit using smokeless tobacco. Social: [name] [name]  reports that he has quit smoking. His smoking use included cigarettes. He has quit using smokeless tobacco. He reports current alcohol use of about 14.0 standard drinks of alcohol per week. He reports that he does not use drugs. Medical:  has a past medical history of Benign esophageal  stricture, Cardiomyopathy, secondary ([place]), Coronary artery disease, Diabetes mellitus without complication ([place]), Diverticulitis, GI bleed, Gout, Hemorrhoids, Hyperlipidemia, Hypertension, Sleep apnea, and Tubular adenoma of colon. Surgical: [name] [name]  has a past surgical history that includes Coronary angioplasty with stent; cardiac bypass; Cardiac surgery; Colonoscopy with propofol (N/A, 04/29/2015); Esophagogastroduodenoscopy (egd) with propofol (N/A, 04/29/2015); Septoplasty; Cardiac catheterization (Left, 02/03/2016); Coronary artery bypass graft; Colonoscopy with propofol (N/A, 07/20/2020); and Lower Extremity Angiography (Left, 11/20/2021). Family: family history includes Heart disease in his mother; Hypertension in his father.  Laboratory Chemistry Profile   Renal Lab Results  Component Value Date   BUN 25 (H) 04/27/2022   CREATININE 1.02 04/27/2022   GFRAA >60 05/07/2019   GFRNONAA >60 04/27/2022    Hepatic Lab Results  Component Value Date   AST 52 (H) 04/25/2022   ALT 48 (H) 04/25/2022   ALBUMIN 4.1 04/25/2022   ALKPHOS 88 04/25/2022   HCVAB NON REACTIVE 10/09/2020   LIPASE 39 07/16/2021    Electrolytes Lab Results  Component Value Date   NA 140 04/27/2022   K 3.7 04/27/2022   CL 107 04/27/2022   CALCIUM 9.2 04/27/2022   MG 2.2 10/09/2020   PHOS 3.7 10/09/2020    Bone Lab Results  Component Value Date   VD25OH 74.7 05/07/2019   VD125OH2TOT 30.8 10/26/2015    Inflammation (CRP: Acute Phase) (ESR: Chronic Phase) Lab Results  Component Value Date   CRP 0.9 05/07/2019   ESRSEDRATE 41 (H) 05/07/2019   LATICACIDVEN 1.9 10/09/2020         Note: Above Lab results reviewed.  Recent Imaging Review  VAS [place] ABI WITH/WO TBI  LOWER EXTREMITY DOPPLER STUDY  Patient Name:  Micco Goodfellow.  Date of Exam:   11/02/2022 Medical Rec #: [id number]           Accession #:    [id number] Date of Birth: [birth year]            Patient Gender: M Patient Age:   39 years Exam  Location:  [facility] Procedure:      VAS [place] ABI WITH/WO TBI Referring Phys: Leotis Pain  --------------------------------------------------------------------------------   Indications: Peripheral artery disease.   Vascular Interventions: 11/20/21: Bilateral CIA stents;.  Comparison Study: 05/04/2022  Performing Technologist: Almira Coaster RVS    Examination Guidelines: A complete evaluation includes at minimum, Doppler waveform signals and systolic blood pressure reading at the level of bilateral brachial, anterior tibial, and posterior tibial arteries, when vessel segments are accessible. Bilateral testing is considered an integral part of a complete examination. Photoelectric Plethysmograph (PPG) waveforms and toe systolic pressure readings are included as required and additional duplex testing as needed. Limited examinations for reoccurring indications may be performed as noted.    ABI Findings: +---------+------------------+-----+--------+--------+ Right    Rt Pressure (mmHg)IndexWaveformComment  +---------+------------------+-----+--------+--------+ Brachial 163                                     +---------+------------------+-----+--------+--------+  ATA      133               0.82 biphasic         +---------+------------------+-----+--------+--------+ PTA      146               0.90 biphasic         +---------+------------------+-----+--------+--------+ Great Toe157               0.96 Normal           +---------+------------------+-----+--------+--------+  +---------+------------------+-----+--------+-------+ Left     Lt Pressure (mmHg)IndexWaveformComment +---------+------------------+-----+--------+-------+ Brachial 157                                    +---------+------------------+-----+--------+-------+ ATA      118               0.72 biphasic         +---------+------------------+-----+--------+-------+ PTA      122               0.75 biphasic        +---------+------------------+-----+--------+-------+ Great Toe115               0.71 Normal          +---------+------------------+-----+--------+-------+  +-------+-----------+-----------+------------+------------+ ABI/TBIToday's ABIToday's TBIPrevious ABIPrevious TBI +-------+-----------+-----------+------------+------------+ Right  .90        .96        1.05        .88          +-------+-----------+-----------+------------+------------+ Left   .75        .71        1.07        .83          +-------+-----------+-----------+------------+------------+    Bilateral ABIs appear decreased compared to prior study on 05/04/2022. Right TBIs appear increased compared to prior study on 05/04/2022. Left TBIs appear to be decreased compared to prior study on 05/04/2022.   Summary: Right: Resting right ankle-brachial index indicates mild right lower extremity arterial disease. The right toe-brachial index is normal.  Left: Resting left ankle-brachial index indicates moderate left lower extremity arterial disease. The left toe-brachial index is normal.  *See table(s) above for measurements and observations.      Electronically signed by Leotis Pain MD on 11/05/2022 at 2:32:03 PM.      Final   VAS US AORTA/IVC/ILIACS ABDOMINAL AORTA STUDY  Patient Name:  Doreon Osada.  Date of Exam:   11/02/2022 Medical Rec #: [id number]           Accession #:    [id number] Date of Birth: [birth year]            Patient Gender: M Patient Age:   37 years Exam Location:  [facility] Procedure:      VAS US AORTA/IVC/ILIACS Referring Phys: Leotis Pain  --------------------------------------------------------------------------------   Indications: F/U Iliac stents    Comparison Study: 05/04/2022  Performing Technologist: Almira Coaster RVS    Examination  Guidelines: A complete evaluation includes B-mode imaging, spectral Doppler, color Doppler, and power Doppler as needed of all accessible portions of each vessel. Bilateral testing is considered an integral part of a complete examination. Limited examinations for reoccurring indications may be performed as noted.    Abdominal Aorta Findings: +-------------+-------+----------+----------+----------+--------+--------+ Location     AP (cm)Trans (cm)PSV (cm/s)Waveform  ThrombusComments +-------------+-------+----------+----------+----------+--------+--------+ Proximal  1.49   1.64      78        monophasic                 +-------------+-------+----------+----------+----------+--------+--------+ Mid          1.51   1.65      69        monophasic                 +-------------+-------+----------+----------+----------+--------+--------+ Distal       1.48   1.43      69        monophasic                 +-------------+-------+----------+----------+----------+--------+--------+ RT CIA Prox  0.9    1.0       194       biphasic                   +-------------+-------+----------+----------+----------+--------+--------+ RT CIA Distal1.0    1.0       90        biphasic                   +-------------+-------+----------+----------+----------+--------+--------+ RT EIA Prox  0.8    0.8       118       biphasic                   +-------------+-------+----------+----------+----------+--------+--------+ RT EIA Distal0.8    0.7       86        biphasic                   +-------------+-------+----------+----------+----------+--------+--------+ LT CIA Prox  0.6    0.7       179       biphasic                   +-------------+-------+----------+----------+----------+--------+--------+ LT CIA Distal0.6    0.6       128       biphasic                    +-------------+-------+----------+----------+----------+--------+--------+ LT EIA Prox  0.8    0.8       81        biphasic                   +-------------+-------+----------+----------+----------+--------+--------+ LT EIA Distal0.8    0.7       89        biphasic                   +-------------+-------+----------+----------+----------+--------+--------+     Summary: Abdominal Aorta: No evidence of an abdominal aortic aneurysm was visualized. The largest aortic measurement is 1.6 cm. Stenosis: The Aorta, CIA and EIA was imaged and Waveforms obtained; Increased Velocities seen in the Right and Left CIA Proximal segment.    *See table(s) above for measurements and observations.   Electronically signed by Leotis Pain MD on 11/05/2022 at 2:31:57 PM.       Final   Note: Reviewed        Physical Exam  General appearance: Well nourished, well developed, and well hydrated. In no apparent acute distress Mental status: Alert, oriented x 3 (person, place, & time)       Respiratory: No evidence of acute respiratory distress Eyes: PERLA Vitals: BP (!) 152/73   Pulse 73   Temp (!) 97.3 F (36.3 C) (Temporal)  Ht '5\' 3"'$  (1.6 m)   Wt 165 lb (74.8 kg)   SpO2 96%   BMI 29.23 kg/m  BMI: Estimated body mass index is 29.23 kg/m as calculated from the following:   Height as of this encounter: '5\' 3"'$  (1.6 m).   Weight as of this encounter: 165 lb (74.8 kg). Ideal: Ideal body weight: 56.9 kg (125 lb 7.1 oz) Adjusted ideal body weight: 64.1 kg (141 lb 4.2 oz)  Assessment   Diagnosis Status  1. Chronic pain syndrome   2. Pharmacologic therapy   3. Chronic shoulder pain (3ry area of Pain) (Right)   4. Lumbar facet syndrome (Bilateral) (R>L)   5. Chronic chest wall pain (1ry area of Pain) (Incisional Midline) (since 04/22/2012)   6. Encounter for medication management   7. Encounter for chronic pain management   8. Chronic use of opiate for therapeutic purpose   9. Chronic  lower extremity pain (2ry area of Pain) (Bilateral) (R>L)   10. Chronic low back pain (Bilateral) (R>L) w/o sciatica    Controlled Controlled Controlled   Updated Problems: No problems updated.  Plan of Care  Problem-specific:  No problem-specific Assessment & Plan notes found for this encounter.  [name] [name]. has a current medication list which includes the following long-term medication(s): allopurinol, atorvastatin, gabapentin, [START ON 11/21/2022] hydrocodone-acetaminophen, [START ON 12/21/2022] hydrocodone-acetaminophen, [START ON 01/20/2023] hydrocodone-acetaminophen, isosorbide mononitrate, metoprolol tartrate, valsartan, losartan, and pantoprazole.  Pharmacotherapy (Medications Ordered): Meds ordered this encounter  Medications   HYDROcodone-acetaminophen (NORCO/VICODIN) 5-325 MG tablet    Sig: Take 1 tablet by mouth 2 (two) times daily as needed for severe pain. Must last 30 days    Dispense:  60 tablet    Refill:  0    DO NOT: delete (not duplicate); no partial-fill (will deny script to complete), no refill request (F/U required). DISPENSE: 1 day early if closed on fill date. WARN: No CNS-depressants within 8 hrs of med.   HYDROcodone-acetaminophen (NORCO/VICODIN) 5-325 MG tablet    Sig: Take 1 tablet by mouth 2 (two) times daily as needed for severe pain. Must last 30 days    Dispense:  60 tablet    Refill:  0    DO NOT: delete (not duplicate); no partial-fill (will deny script to complete), no refill request (F/U required). DISPENSE: 1 day early if closed on fill date. WARN: No CNS-depressants within 8 hrs of med.   HYDROcodone-acetaminophen (NORCO/VICODIN) 5-325 MG tablet    Sig: Take 1 tablet by mouth 2 (two) times daily as needed for severe pain. Must last 30 days    Dispense:  60 tablet    Refill:  0    DO NOT: delete (not duplicate); no partial-fill (will deny script to complete), no refill request (F/U required). DISPENSE: 1 day early if closed on fill date.  WARN: No CNS-depressants within 8 hrs of med.   Orders:  No orders of the defined types were placed in this encounter.  Follow-up plan:   Return in about 3 months (around 02/19/2023) for Eval-day (M,W), (F2F), (MM).      Interventional Therapies  Risk Factors  Considerations:      Planned  Pending:      Under consideration:   Therapeutic bilateral Lumbar facet + bilateral sacroiliac joint RFA #1    Completed:   Palliative left IA hip inj. x1 (05/13/2018) (100/100/75/>75)  Palliative bilateral lumbar facet MBB x2 (02/21/2016)  Palliative bilateral SI Blk x1 (02/21/2016)    Therapeutic  Palliative (PRN) options:   Palliative left intra-articular hip joint injection #2  Palliative bilateral lumbar facet block #3  Palliative bilateral sacroiliac joint block #2    Pharmacotherapy  Nonopioids transfer 07/25/2020: Gabapentin       Recent Visits No visits were found meeting these conditions. Showing recent visits within past 90 days and meeting all other requirements Today's Visits Date Type Provider Dept  11/12/22 Office Visit Milinda Pointer, MD [facility]  Showing today's visits and meeting all other requirements Future Appointments No visits were found meeting these conditions. Showing future appointments within next 90 days and meeting all other requirements  I discussed the assessment and treatment plan with the patient. The patient was provided an opportunity to ask questions and all were answered. The patient agreed with the plan and demonstrated an understanding of the instructions.  Patient advised to call back or seek an in-person evaluation if the symptoms or condition worsens.  Duration of encounter: 30 minutes.  Total time on encounter, as per AMA guidelines included both the face-to-face and non-face-to-face time personally spent by the physician and/or other qualified health care professional(s) on the day of the encounter (includes time in  activities that require the physician or other qualified health care professional and does not include time in activities normally performed by clinical staff). Physician's time may include the following activities when performed: Preparing to see the patient (e.g., pre-charting review of records, searching for previously ordered imaging, lab work, and nerve conduction tests) Review of prior analgesic pharmacotherapies. Reviewing PMP Interpreting ordered tests (e.g., lab work, imaging, nerve conduction tests) Performing post-procedure evaluations, including interpretation of diagnostic procedures Obtaining and/or reviewing separately obtained history Performing a medically appropriate examination and/or evaluation Counseling and educating the patient/family/caregiver Ordering medications, tests, or procedures Referring and communicating with other health care professionals (when not separately reported) Documenting clinical information in the electronic or other health record Independently interpreting results (not separately reported) and communicating results to the patient/ family/caregiver Care coordination (not separately reported)  Note by: Gaspar Cola, MD Date: 11/12/2022; Time: 1:02 PM

## 2022-11-12 NOTE — Patient Instructions (Signed)
____________________________________________________________________________________________  Opioid Pain Medication Update  To: All patients taking opioid pain medications. (I.e.: hydrocodone, hydromorphone, oxycodone, oxymorphone, morphine, codeine, methadone, tapentadol, tramadol, buprenorphine, fentanyl, etc.)  Re: Updated review of side effects and adverse reactions of opioid analgesics, as well as new information about long term effects of this class of medications.  Direct risks of long-term opioid therapy are not limited to opioid addiction and overdose. Potential medical risks include serious fractures, breathing problems during sleep, hyperalgesia, immunosuppression, chronic constipation, bowel obstruction, myocardial infarction, and tooth decay secondary to xerostomia.  Unpredictable adverse effects that can occur even if you take your medication correctly: Cognitive impairment, respiratory depression, and death. Most people think that if they take their medication "correctly", and "as instructed", that they will be safe. Nothing could be farther from the truth. In reality, a significant amount of recorded deaths associated with the use of opioids has occurred in individuals that had taken the medication for a long time, and were taking their medication correctly. The following are examples of how this can happen: Patient taking his/her medication for a long time, as instructed, without any side effects, is given a certain antibiotic or another unrelated medication, which in turn triggers a "Drug-to-drug interaction" leading to disorientation, cognitive impairment, impaired reflexes, respiratory depression or an untoward event leading to serious bodily harm or injury, including death.  Patient taking his/her medication for a long time, as instructed, without any side effects, develops an acute impairment of liver and/or kidney function. This will lead to a rapid inability of the body to  breakdown and eliminate their pain medication, which will result in effects similar to an "overdose", but with the same medicine and dose that they had always taken. This again may lead to disorientation, cognitive impairment, impaired reflexes, respiratory depression or an untoward event leading to serious bodily harm or injury, including death.  A similar problem will occur with patients as they grow older and their liver and kidney function begins to decrease as part of the aging process.  Background information: Historically, the original case for using long-term opioid therapy to treat chronic noncancer pain was based on safety assumptions that subsequent experience has called into question. In 1996, the American Pain Society and the [place] [employer] issued a consensus statement supporting long-term opioid therapy. This statement acknowledged the dangers of opioid prescribing but concluded that the risk for addiction was low; respiratory depression induced by opioids was short-lived, occurred mainly in opioid-naive patients, and was antagonized by pain; tolerance was not a common problem; and efforts to control diversion should not constrain opioid prescribing. This has now proven to be wrong. Experience regarding the risks for opioid addiction, misuse, and overdose in community practice has failed to support these assumptions.  According to the Centers for Disease Control and Prevention, fatal overdoses involving opioid analgesics have increased sharply over the past decade. Currently, more than 96,700 people die from drug overdoses every year. Opioids are a factor in 7 out of every 10 overdose deaths. Deaths from drug overdose have surpassed motor vehicle accidents as the leading cause of death for individuals between the ages of 3 and 36.  Clinical data suggest that neuroendocrine dysfunction may be very common in both men and women, potentially causing hypogonadism, erectile  dysfunction, infertility, decreased libido, osteoporosis, and depression. Recent studies linked higher opioid dose to increased opioid-related mortality. Controlled observational studies reported that long-term opioid therapy may be associated with increased risk for cardiovascular events. Subsequent meta-analysis concluded  that the safety of long-term opioid therapy in elderly patients has not been proven.   Side Effects and adverse reactions: Common side effects: Drowsiness (sedation). Dizziness. Nausea and vomiting. Constipation. Physical dependence -- Dependence often manifests with withdrawal symptoms when opioids are discontinued or decreased. Tolerance -- As you take repeated doses of opioids, you require increased medication to experience the same effect of pain relief. Respiratory depression -- This can occur in healthy people, especially with higher doses. However, people with COPD, asthma or other lung conditions may be even more susceptible to fatal respiratory impairment.  Uncommon side effects: An increased sensitivity to feeling pain and extreme response to pain (hyperalgesia). Chronic use of opioids can lead to this. Delayed gastric emptying (the process by which the contents of your stomach are moved into your small intestine). Muscle rigidity. Immune system and hormonal dysfunction. Quick, involuntary muscle jerks (myoclonus). Arrhythmia. Itchy skin (pruritus). Dry mouth (xerostomia).  Long-term side effects: Chronic constipation. Sleep-disordered breathing (SDB). Increased risk of bone fractures. Hypothalamic-pituitary-adrenal dysregulation. Increased risk of overdose.  RISKS: Fractures and Falls:  Opioids increase the risk and incidence of falls. This is of particular importance in elderly patients.  Endocrine System:  Long-term administration is associated with endocrine abnormalities (endocrinopathies). (Also known as Opioid-induced Endocrinopathy) Influences  on both the hypothalamic-pituitary-adrenal axis?and the hypothalamic-pituitary-gonadal axis have been demonstrated with consequent hypogonadism and adrenal insufficiency in both sexes. Hypogonadism and decreased levels of dehydroepiandrosterone sulfate have been reported in men and women. Endocrine effects include: Amenorrhoea in women (abnormal absence of menstruation) Reduced libido in both sexes Decreased sexual function Erectile dysfunction in men Hypogonadisms (decreased testicular function with shrinkage of testicles) Infertility Depression and fatigue Loss of muscle mass Anxiety Depression Immune suppression Hyperalgesia Weight gain Anemia Osteoporosis Patients (particularly women of childbearing age) should avoid opioids. There is insufficient evidence to recommend routine monitoring of asymptomatic patients taking opioids in the long-term for hormonal deficiencies.  Immune System: Human studies have demonstrated that opioids have an immunomodulating effect. These effects are mediated via opioid receptors both on immune effector cells and in the central nervous system. Opioids have been demonstrated to have adverse effects on antimicrobial response and anti-tumour surveillance. Buprenorphine has been demonstrated to have no impact on immune function.  Opioid Induced Hyperalgesia: Human studies have demonstrated that prolonged use of opioids can lead to a state of abnormal pain sensitivity, sometimes called opioid induced hyperalgesia (OIH). Opioid induced hyperalgesia is not usually seen in the absence of tolerance to opioid analgesia. Clinically, hyperalgesia may be diagnosed if the patient on long-term opioid therapy presents with increased pain. This might be qualitatively and anatomically distinct from pain related to disease progression or to breakthrough pain resulting from development of opioid tolerance. Pain associated with hyperalgesia tends to be more diffuse than the  pre-existing pain and less defined in quality. Management of opioid induced hyperalgesia requires opioid dose reduction.  Cancer: Chronic opioid therapy has been associated with an increased risk of cancer among noncancer patients with chronic pain. This association was more evident in chronic strong opioid users. Chronic opioid consumption causes significant pathological changes in the small intestine and colon. Epidemiological studies have found that there is a link between opium dependence and initiation of gastrointestinal cancers. Cancer is the second leading cause of death after cardiovascular disease. Chronic use of opioids can cause multiple conditions such as GERD, immunosuppression and renal damage as well as carcinogenic effects, which are associated with the incidence of cancers.   Mortality: Long-term opioid use  has been associated with increased mortality among patients with chronic non-cancer pain (CNCP).  Prescription of long-acting opioids for chronic noncancer pain was associated with a significantly increased risk of all-cause mortality, including deaths from causes other than overdose.  Reference: Von Korff M, Kolodny A, Deyo RA, Chou R. Long-term opioid therapy reconsidered. Ann Intern Med. 2011 Sep 6;155(5):325-8. doi: 10.7326/[id number]. PMID: [id number]; PMCIDXX:1631110. Morley Kos, Hayward RA, Dunn KM, [place] KP. Risk of adverse events in patients prescribed long-term opioids: A cohort study in the [place] Clinical Practice Research D[employer]. Eur J Pain. 2019 May;23(5):908-922. doi: 10.1002/ejp.1357. Epub 2019 Jan 31. PMID: [id number]. Colameco S, Coren JS, Ciervo CA. Continuous opioid treatment for chronic noncancer pain: a time for moderation in prescribing. Postgrad Med. 2009 Jul;121(4):61-6. doi: 10.3810/pgm.2009.07.2032. PMID: [id number]. Heywood Bene RN, [place] SD, Blazina I, Rosalio Loud, Bougatsos C, Deyo RA. The  effectiveness and risks of long-term opioid therapy for chronic pain: a systematic review for a [employer] of Health Pathways to [employer]. Ann Intern Med. 2015 Feb 17;162(4):276-86. doi: [id number]. PMID: [id number]. Marjory Sneddon [facility], [place] DM. NCHS Data Brief No. 22. [place]: Centers for Disease Control and Prevention; 2009. Sep, Increase in Fatal Poisonings Involving Opioid Analgesics in the [place], 1999-2006. Song IA, Choi HR, Oh TK. Long-term opioid use and mortality in patients with chronic non-cancer pain: Ten-year follow-up study in [place] from 2010 through 2019. EClinicalMedicine. 2022 Jul 18;51:101558. doi: 10.1016/j.eclinm.2022.UB:[id number]. PMID: [id number]; PMCIDOX:8550940. Huser, W., Schubert, T., Vogelmann, T. et al. All-cause mortality in patients with long-term opioid therapy compared with non-opioid analgesics for chronic non-cancer pain: a database study. [place] Med 18, 162 (2020). [email or web address] Rashidian H, Roxy Cedar, Malekzadeh R, Haghdoost AA. An Ecological Study of the Association between Opiate Use and Incidence of Cancers. Addict Health. 2016 Fall;8(4):252-260. PMID: [id number]; PMCIDQI:9185013.  Our Goal: Our goal is to control your pain with means other than the use of opioid pain medications.  Our Recommendation: Talk to your physician about coming off of these medications. We can assist you with the tapering down and stopping these medicines. Based on the new information, even if you cannot completely stop the medication, a decrease in the dose may be associated with a lesser risk. Ask for other means of controlling the pain. Decrease or eliminate those factors that significantly contribute to your pain such as smoking, obesity, and a diet heavily tilted towards "inflammatory" nutrients.  Last Updated: 10/31/2022    ____________________________________________________________________________________________     ____________________________________________________________________________________________  Patient Information update  To: All of our patients.  Re: Name change.  It has been made official that our current name, "[facility]"   will soon be changed to "[facility]".   The purpose of this change is to eliminate any confusion created by the concept of our practice being a "Medication Management Pain Clinic". In the past this has led to the misconception that we treat pain primarily by the use of prescription medications.  Nothing can be farther from the truth.   Understanding PAIN MANAGEMENT: To further understand what our practice does, you first have to understand that "Pain Management" is a subspecialty that requires additional training once a physician has completed their specialty training, which can be in either Anesthesia, Neurology, Psychiatry, or Physical Medicine and Rehabilitation (PMR). Each one of these contributes to the final approach taken by each physician to  the management of their patient's pain. To be a "Pain Management Specialist" you must have first completed one of the specialty trainings below.  Anesthesiologists - trained in clinical pharmacology and interventional techniques such as nerve blockade and regional as well as central neuroanatomy. They are trained to block pain before, during, and after surgical interventions.  Neurologists - trained in the diagnosis and pharmacological treatment of complex neurological conditions, such as Multiple Sclerosis, Parkinson's, spinal cord injuries, and other systemic conditions that may be associated with symptoms that may include but are not limited to pain. They tend to rely primarily on the treatment of chronic pain  using prescription medications.  Psychiatrist - trained in conditions affecting the psychosocial wellbeing of patients including but not limited to depression, anxiety, schizophrenia, personality disorders, addiction, and other substance use disorders that may be associated with chronic pain. They tend to rely primarily on the treatment of chronic pain using prescription medications.   Physical Medicine and Rehabilitation (PMR) physicians, also known as physiatrists - trained to treat a wide variety of medical conditions affecting the brain, spinal cord, nerves, bones, joints, ligaments, muscles, and tendons. Their training is primarily aimed at treating patients that have suffered injuries that have caused severe physical impairment. Their training is primarily aimed at the physical therapy and rehabilitation of those patients. They may also work alongside orthopedic surgeons or neurosurgeons using their expertise in assisting surgical patients to recover after their surgeries.  INTERVENTIONAL PAIN MANAGEMENT is sub-subspecialty of Pain Management.  Our physicians are Board-certified in Anesthesia, Pain Management, and Interventional Pain Management.  This meaning that not only have they been trained and Board-certified in their specialty of Anesthesia, and subspecialty of Pain Management, but they have also received further training in the sub-subspecialty of Interventional Pain Management, in order to become Board-certified as INTERVENTIONAL PAIN MANAGEMENT SPECIALIST.    Mission: Our goal is to use our skills in  [place] as alternatives to the chronic use of prescription opioid medications for the treatment of pain. To make this more clear, we have changed our name to reflect what we do and offer. We will continue to offer medication management assessment and recommendations, but we will not be taking over any patient's medication  management.  ____________________________________________________________________________________________     ____________________________________________________________________________________________  National Pain Medication Shortage  The [place] is experiencing worsening drug shortages. These have had a negative widespread effect on patient care and treatment. Not expected to improve any time soon. Predicted to last past 2029.   Drug shortage list (generic names) Oxycodone IR Oxycodone/APAP Oxymorphone IR Hydromorphone Hydrocodone/APAP Morphine  Where is the problem?  Manufacturing and supply level.  Will this shortage affect you?  Only if you take any of the above pain medications.  How? You may be unable to fill your prescription.  Your pharmacist may offer a "partial fill" of your prescription. (Warning: Do not accept partial fills.) Prescriptions partially filled cannot be transferred to another pharmacy. Read our Medication Rules and Regulation. Depending on how much medicine you are dependent on, you may experience withdrawals when unable to get the medication.  Recommendations: Consider ending your dependence on opioid pain medications. Ask your pain specialist to assist you with the process. Consider switching to a medication currently not in shortage, such as Buprenorphine. Talk to your pain specialist about this option. Consider decreasing your pain medication requirements by managing tolerance thru "Drug Holidays". This may help minimize withdrawals, should you run out of medicine. Control your pain thru  the use of non-pharmacological interventional therapies.   Your prescriber: Prescribers cannot be blamed for shortages. Medication manufacturing and supply issues cannot be fixed by the prescriber.   NOTE: The prescriber is not responsible for supplying the medication, or solving supply issues. Work with your pharmacist to solve it. The patient is responsible for  the decision to take or continue taking the medication and for identifying and securing a legal supply source. By law, supplying the medication is the job and responsibility of the pharmacy. The prescriber is responsible for the evaluation, monitoring, and prescribing of these medications.   Prescribers will NOT: Re-issue prescriptions that have been partially filled. Re-issue prescriptions already sent to a pharmacy.  Re-send prescriptions to a different pharmacy because yours did not have your medication. Ask pharmacist to order more medicine or transfer the prescription to another pharmacy. (Read below.)  New 2023 regulation: "May 04, 2022 Revised Regulation Allows DEA-Registered Pharmacies to Transfer Electronic Prescriptions at a Patient's Request [place] Patients now have the ability to request their electronic prescription be transferred to another pharmacy without having to go back to their practitioner to initiate the request. This revised regulation went into effect on Monday, April 30, 2022.     At a patient's request, a DEA-registered retail pharmacy can now transfer an electronic prescription for a controlled substance (schedules II-V) to another DEA-registered retail pharmacy. Prior to this change, patients would have to go through their practitioner to cancel their prescription and have it re-issued to a different pharmacy. The process was taxing and time consuming for both patients and practitioners.    The Drug Enforcement Administration [facility]) published its intent to revise the process for transferring electronic prescriptions on July 22, 2020.  The final rule was published in the federal register on March 29, 2022 and went into effect 30 days later.  Under the final rule, a prescription can only be transferred once between pharmacies, and only if allowed under existing state or other applicable law. The prescription must  remain in its electronic form; may not be altered in any way; and the transfer must be communicated directly between two licensed pharmacists. It's important to note, any authorized refills transfer with the original prescription, which means the entire prescription will be filled at the same pharmacy".  Reference: [email or web address] [facility] website announcement)  [email or web address] ([place])   [employer] / Vol. 88, No. 143 / Thursday, March 29, 2022 / Rules and Regulations DEPARTMENT OF JUSTICE  Drug Enforcement Administration  21 CFR Part 1306  [Docket No. [id number]]  RIN [id number] Transfer of Electronic Prescriptions for Schedules II-V Controlled Substances Between Pharmacies for Initial Filling  ____________________________________________________________________________________________     ____________________________________________________________________________________________  Transfer of Pain Medication between Pharmacies  Re: 2023 DEA Clarification on existing regulation  Published on DEA Website: May 04, 2022  Title: Revised Regulation Allows DEA-Registered Pharmacies to Conservator, museum/gallery Prescriptions at a Patient's Request [place]  "Patients now have the ability to request their electronic prescription be transferred to another pharmacy without having to go back to their practitioner to initiate the request. This revised regulation went into effect on Monday, April 30, 2022.     At a patient's request, a DEA-registered retail pharmacy can now transfer an electronic prescription for a controlled substance (schedules II-V) to another DEA-registered retail pharmacy. Prior to this change, patients would have to go through their practitioner to  cancel their prescription  and have it re-issued to a different pharmacy. The process was taxing and time consuming for both patients and practitioners.    The Drug Enforcement Administration [facility]) published its intent to revise the process for transferring electronic prescriptions on July 22, 2020.  The final rule was published in the federal register on March 29, 2022 and went into effect 30 days later.  Under the final rule, a prescription can only be transferred once between pharmacies, and only if allowed under existing state or other applicable law. The prescription must remain in its electronic form; may not be altered in any way; and the transfer must be communicated directly between two licensed pharmacists. It's important to note, any authorized refills transfer with the original prescription, which means the entire prescription will be filled at the same pharmacy."    REFERENCES: 1. DEA website announcement [email or web address]  2. Department of Justice website  [email or web address]  3. DEPARTMENT OF JUSTICE Drug Enforcement Administration 21 CFR Part 1306 [Docket No. [id number]] RIN [id number] "Transfer of Electronic Prescriptions for Schedules II-V Controlled Substances Between Pharmacies for Initial Filling"  ____________________________________________________________________________________________     _______________________________________________________________________  Medication Rules  Purpose: To inform patients, and their family members, of our medication rules and regulations.  Applies to: All patients receiving prescriptions from our practice (written or electronic).  Pharmacy of record: This is the pharmacy where your electronic prescriptions will be sent. Make sure we have the correct one.  Electronic prescriptions: In  compliance with the [place] (STOP) Act of 2017 (Session Lanny Cramp [phone]), effective September 03, 2018, all controlled substances must be electronically prescribed. Written prescriptions, faxing, or calling prescriptions to a pharmacy will no longer be done.  Prescription refills: These will be provided only during in-person appointments. No medications will be renewed without a "face-to-face" evaluation with your provider. Applies to all prescriptions.  NOTE: The following applies primarily to controlled substances (Opioid* Pain Medications).   Type of encounter (visit): For patients receiving controlled substances, face-to-face visits are required. (Not an option and not up to the patient.)  Patient's responsibilities: Pain Pills: Bring all pain pills to every appointment (except for procedure appointments). Pill Bottles: Bring pills in original pharmacy bottle. Bring bottle, even if empty. Always bring the bottle of the most recent fill.  Medication refills: You are responsible for knowing and keeping track of what medications you are taking and when is it that you will need a refill. The day before your appointment: write a list of all prescriptions that need to be refilled. The day of the appointment: give the list to the admitting nurse. Prescriptions will be written only during appointments. No prescriptions will be written on procedure days. If you forget a medication: it will not be "Called in", "Faxed", or "electronically sent". You will need to get another appointment to get these prescribed. No early refills. Do not call asking to have your prescription filled early. Partial  or short prescriptions: Occasionally your pharmacy may not have enough pills to fill your prescription.  NEVER ACCEPT a partial fill or a prescription that is short of the total amount of pills that you were prescribed.  With controlled substances the law allows 72 hours for  the pharmacy to complete the prescription.  If the prescription is not completed within 72 hours, the pharmacist will require a new prescription to be written. This means that you will be short on your medicine and we WILL NOT send another prescription to complete your original  prescription.  Instead, request the pharmacy to send a carrier to a nearby branch to get enough medication to provide you with your full prescription. Prescription Accuracy: You are responsible for carefully inspecting your prescriptions before leaving our office. Have the discharge nurse carefully go over each prescription with you, before taking them home. Make sure that your name is accurately spelled, that your address is correct. Check the name and dose of your medication to make sure it is accurate. Check the number of pills, and the written instructions to make sure they are clear and accurate. Make sure that you are given enough medication to last until your next medication refill appointment. Taking Medication: Take medication as prescribed. When it comes to controlled substances, taking less pills or less frequently than prescribed is permitted and encouraged. Never take more pills than instructed. Never take the medication more frequently than prescribed.  Inform other Doctors: Always inform, all of your healthcare providers, of all the medications you take. Pain Medication from other Providers: You are not allowed to accept any additional pain medication from any other Doctor or Healthcare provider. There are two exceptions to this rule. (see below) In the event that you require additional pain medication, you are responsible for notifying us, as stated below. Cough Medicine: Often these contain an opioid, such as codeine or hydrocodone. Never accept or take cough medicine containing these opioids if you are already taking an opioid* medication. The combination may cause respiratory failure and death. Medication Agreement:  You are responsible for carefully reading and following our Medication Agreement. This must be signed before receiving any prescriptions from our practice. Safely store a copy of your signed Agreement. Violations to the Agreement will result in no further prescriptions. (Additional copies of our Medication Agreement are available upon request.) Laws, Rules, & Regulations: All patients are expected to follow all Federal and [employer], [employer], Rules, [facility]. Ignorance of the Laws does not constitute a valid excuse.  Illegal drugs and Controlled Substances: The use of illegal substances (including, but not limited to marijuana and its derivatives) and/or the illegal use of any controlled substances is strictly prohibited. Violation of this rule may result in the immediate and permanent discontinuation of any and all prescriptions being written by our practice. The use of any illegal substances is prohibited. Adopted CDC guidelines & recommendations: Target dosing levels will be at or below 60 MME/day. Use of benzodiazepines** is not recommended.  Exceptions: There are only two exceptions to the rule of not receiving pain medications from other Healthcare Providers. Exception #1 (Emergencies): In the event of an emergency (i.e.: accident requiring emergency care), you are allowed to receive additional pain medication. However, you are responsible for: As soon as you are able, call our office [phone], at any time of the day or night, and leave a message stating your name, the date and nature of the emergency, and the name and dose of the medication prescribed. In the event that your call is answered by a member of our staff, make sure to document and save the date, time, and the name of the person that took your information.  Exception #2 (Planned Surgery): In the event that you are scheduled by another doctor or dentist to have any type of surgery or procedure, you are allowed (for a period no  longer than 30 days), to receive additional pain medication, for the acute post-op pain. However, in this case, you are responsible for picking up a copy of  our "Post-op Pain Management for Surgeons" handout, and giving it to your surgeon or dentist. This document is available at our office, and does not require an appointment to obtain it. Simply go to our office during business hours (Monday-Thursday from 8:00 AM to 4:00 PM) (Friday 8:00 AM to 12:00 Noon) or if you have a scheduled appointment with [place], prior to your surgery, and ask for it by name. In addition, you are responsible for: calling our office [phone], at any time of the day or night, and leaving a message stating your name, name of your surgeon, type of surgery, and date of procedure or surgery. Failure to comply with your responsibilities may result in termination of therapy involving the controlled substances. Medication Agreement Violation. Following the above rules, including your responsibilities will help you in avoiding a Medication Agreement Violation ("Breaking your Pain Medication Contract").  Consequences:  Not following the above rules may result in permanent discontinuation of medication prescription therapy.  *Opioid medications include: morphine, codeine, oxycodone, oxymorphone, hydrocodone, hydromorphone, meperidine, tramadol, tapentadol, buprenorphine, fentanyl, methadone. **Benzodiazepine medications include: diazepam (Valium), alprazolam (Xanax), clonazepam (Klonopine), lorazepam (Ativan), clorazepate (Tranxene), chlordiazepoxide (Librium), estazolam (Prosom), oxazepam (Serax), temazepam (Restoril), triazolam (Halcion) (Last updated: 06/26/2022) ______________________________________________________________________    ______________________________________________________________________  Medication Recommendations and Reminders  Applies to: All patients receiving prescriptions (written and/or  electronic).  Medication Rules & Regulations: You are responsible for reading, knowing, and following our "Medication Rules" document. These exist for your safety and that of others. They are not flexible and neither are we. Dismissing or ignoring them is an act of "non-compliance" that may result in complete and irreversible termination of such medication therapy. For safety reasons, "non-compliance" will not be tolerated. As with the [place] fundamental legal principle of "ignorance of the law is no defense", we will accept no excuses for not having read and knowing the content of documents provided to you by our practice.  Pharmacy of record:  Definition: This is the pharmacy where your electronic prescriptions will be sent.  We do not endorse any particular pharmacy. It is up to you and your insurance to decide what pharmacy to use.  We do not restrict you in your choice of pharmacy. However, once we write for your prescriptions, we will NOT be re-sending more prescriptions to fix restricted supply problems created by your pharmacy, or your insurance.  The pharmacy listed in the electronic medical record should be the one where you want electronic prescriptions to be sent. If you choose to change pharmacy, simply notify our nursing staff. Changes will be made only during your regular appointments and not over the phone.  Recommendations: Keep all of your pain medications in a safe place, under lock and key, even if you live alone. We will NOT replace lost, stolen, or damaged medication. We do not accept "Police Reports" as proof of medications having been stolen. After you fill your prescription, take 1 week's worth of pills and put them away in a safe place. You should keep a separate, properly labeled bottle for this purpose. The remainder should be kept in the original bottle. Use this as your primary supply, until it runs out. Once it's gone, then you know that you have 1 week's worth of medicine,  and it is time to come in for a prescription refill. If you do this correctly, it is unlikely that you will ever run out of medicine. To make sure that the above recommendation works, it is very important that you make  sure your medication refill appointments are scheduled at least 1 week before you run out of medicine. To do this in an effective manner, make sure that you do not leave the office without scheduling your next medication management appointment. Always ask the nursing staff to show you in your prescription , when your medication will be running out. Then arrange for the receptionist to get you a return appointment, at least 7 days before you run out of medicine. Do not wait until you have 1 or 2 pills left, to come in. This is very poor planning and does not take into consideration that we may need to cancel appointments due to bad weather, sickness, or emergencies affecting our staff. DO NOT ACCEPT A "Partial Fill": If for any reason your pharmacy does not have enough pills/tablets to completely fill or refill your prescription, do not allow for a "partial fill". The law allows the pharmacy to complete that prescription within 72 hours, without requiring a new prescription. If they do not fill the rest of your prescription within those 72 hours, you will need a separate prescription to fill the remaining amount, which we will NOT provide. If the reason for the partial fill is your insurance, you will need to talk to the pharmacist about payment alternatives for the remaining tablets, but again, DO NOT ACCEPT A PARTIAL FILL, unless you can trust your pharmacist to obtain the remainder of the pills within 72 hours.  Prescription refills and/or changes in medication(s):  Prescription refills, and/or changes in dose or medication, will be conducted only during scheduled medication management appointments. (Applies to both, written and electronic prescriptions.) No refills on procedure days. No  medication will be changed or started on procedure days. No changes, adjustments, and/or refills will be conducted on a procedure day. Doing so will interfere with the diagnostic portion of the procedure. No phone refills. No medications will be "called into the pharmacy". No Fax refills. No weekend refills. No Holliday refills. No after hours refills.  Remember:  Business hours are:  Monday to Thursday 8:00 AM to 4:00 PM Provider's Schedule: Milinda Pointer, MD - Appointments are:  Medication management: Monday and Wednesday 8:00 AM to 4:00 PM Procedure day: Tuesday and Thursday 7:30 AM to 4:00 PM Gillis Santa, MD - Appointments are:  Medication management: Tuesday and Thursday 8:00 AM to 4:00 PM Procedure day: Monday and Wednesday 7:30 AM to 4:00 PM (Last update: 06/26/2022) ______________________________________________________________________    ____________________________________________________________________________________________  Drug Holidays  What is a "Drug Holiday"? Drug Holiday: is the name given to the process of slowly tapering down and temporarily stopping the pain medication for the purpose of decreasing or eliminating tolerance to the drug.  Benefits Improved effectiveness Decreased required effective dose Improved pain control End dependence on high dose therapy Decrease cost of therapy Uncovering "opioid-induced hyperalgesia". (OIH)  What is "opioid hyperalgesia"? It is a paradoxical increase in pain caused by exposure to opioids. Stopping the opioid pain medication, contrary to the expected, it actually decreases or completely eliminates the pain. Ref.: "A comprehensive review of opioid-induced hyperalgesia". Brion Aliment, et.al. Pain Physician. 2011 Mar-Apr;14(2):145-61.  What is tolerance? Tolerance: the progressive loss of effectiveness of a pain medicine due to repetitive use. A common problem of opioid pain medications.  How long should a "Drug  Holiday" last? Effectiveness depends on the patient staying off all opioid pain medicines for a minimum of 14 consecutive days. (2 weeks)  How about just taking less of the medicine? Does not  work. Will not accomplish goal of eliminating the excess receptors.  How about switching to a different pain medicine? (AKA. "Opioid rotation") Does not work. Creates the illusion of effectiveness by taking advantage of inaccurate equivalent dose calculations between different opioids. -This "technique" was promoted by studies funded by [employer], such as [employer], creators of "OxyContin".  Can I stop the medicine "cold [place]"? Depends. You should always coordinate with your Pain Specialist to make the transition as smoothly as possible. Avoid stopping the medicine abruptly without consulting. We recommend a "slow taper".  What is a slow taper? Taper: refers to the gradual decrease in dose.   How do I stop/taper the dose? Slowly. Decrease the daily amount of pills that you take by one (1) pill every seven (7) days. This is called a "slow downward taper". Example: if you normally take four (4) pills per day, drop it to three (3) pills per day for seven (7) days, then to two (2) pills per day for seven (7) days, then to one (1) per day for seven (7) days, and then stop the medicine. The 14 day "Drug Holiday" starts on the first day without medicine.   Will I experience withdrawals? Unlikely with a slow taper.  What triggers withdrawals? Withdrawals are triggered by the sudden/abrupt stop of high dose opioids. Withdrawals can be avoided by slowly decreasing the dose over a prolonged period of time.  What are withdrawals? Symptoms associated with sudden/abrupt reduction/stopping of high-dose, long-term use of pain medication. Withdrawal are seldom seen on low dose therapy, or patients rarely taking opioid medication.  Early Withdrawal Symptoms may include: Agitation Anxiety Muscle  aches Increased tearing Insomnia Runny nose Sweating Yawning  Late symptoms may include: Abdominal cramping Diarrhea Dilated pupils Goose bumps Nausea Vomiting  (Last update: 08/12/2022) ____________________________________________________________________________________________    ____________________________________________________________________________________________  WARNING: CBD (cannabidiol) & Delta (Delta-8 tetrahydrocannabinol) products.   Applicable to:  All individuals currently taking or considering taking CBD (cannabidiol) and, more important, all patients taking opioid analgesic controlled substances (pain medication). (Example: oxycodone; oxymorphone; hydrocodone; hydromorphone; morphine; methadone; tramadol; tapentadol; fentanyl; buprenorphine; butorphanol; dextromethorphan; meperidine; codeine; etc.)  Introduction:  Recently there has been a drive towards the use of "natural" products for the treatment of different conditions, including pain anxiety and sleep disorders. Marijuana and hemp are two varieties of the cannabis genus plants. Marijuana and its derivatives are illegal, while hemp and its derivatives are not. Cannabidiol (CBD) and tetrahydrocannabinol (THC), are two natural compounds found in plants of the Cannabis genus. They can both be extracted from hemp or marijuana. Both compounds interact with your body's endocannabinoid system in very different ways. CBD is associated with pain relief (analgesia) while THC is associated with the psychoactive effects ("the high") obtained from the use of marijuana products. There are two main types of THC: Delta-9, which comes from the marijuana plant and it is illegal, and Delta-8, which comes from the hemp plant, and it is legal. (Both, Delta-9-THC and Delta-8-THC are psychoactive and give you "the high".)   Legality:  Marijuana and its derivatives: illegal Hemp and its derivatives: Legal (State dependent) UPDATE:  (10/20/2021) The Drug Enforcement Agency ([place]) issued a letter stating that "delta" cannabinoids, including Delta-8-THCO and Delta-9-THCO, synthetically derived from hemp do not qualify as hemp and will be viewed as Schedule I drugs. (Schedule I drugs, substances, or chemicals are defined as drugs with no currently accepted medical use and a high potential for abuse. Some examples of Schedule I drugs are: heroin,  lysergic acid diethylamide (LSD), marijuana (cannabis), 3,4-methylenedioxymethamphetamine (ecstasy), methaqualone, and peyote.) (https://jennings.com/)  Legal status of CBD in [place]:  "Conditionally Legal"  Reference: "FDA Regulation of Cannabis and Cannabis-Derived Products, Including Cannabidiol (CBD)" - SeekArt[employer].com.pt  Warning:  CBD is not FDA approved and has not undergo the same manufacturing controls as prescription drugs.  This means that the purity and safety of available CBD may be questionable. Most of the time, despite manufacturer's claims, it is contaminated with THC (delta-9-tetrahydrocannabinol - the chemical in marijuana responsible for the "HIGH").  When this is the case, the [facility] contaminant will trigger a positive urine drug screen (UDS) test for Marijuana (carboxy-THC).   The FDA recently put out a warning about 5 things that everyone should be aware of regarding Delta-8 THC: Delta-8 THC products have not been evaluated or approved by the FDA for safe use and may be marketed in ways that put the public health at risk. The FDA has received adverse event reports involving delta-8 THC-containing products. Delta-8 THC has psychoactive and intoxicating effects. Delta-8 THC manufacturing often involve use of potentially harmful chemicals to create the concentrations of delta-8 THC claimed in the marketplace. The final delta-8 THC product may have potentially harmful  by-products (contaminants) due to the chemicals used in the process. Manufacturing of delta-8 THC products may occur in uncontrolled or unsanitary settings, which may lead to the presence of unsafe contaminants or other potentially harmful substances. Delta-8 THC products should be kept out of the reach of children and pets.  NOTE: Because a positive UDS for any illicit substance is a violation of our medication agreement, your opioid analgesics (pain medicine) may be permanently discontinued.  MORE ABOUT CBD  General Information: CBD was discovered in [birth year] and it is a derivative of the cannabis sativa genus plants (Marijuana and Hemp). It is one of the 113 identified substances found in Marijuana. It accounts for up to 40% of the plant's extract. As of 2018, preliminary clinical studies on CBD included research for the treatment of anxiety, movement disorders, and pain. CBD is available and consumed in multiple forms, including inhalation of smoke or vapor, as an aerosol spray, and by mouth. It may be supplied as an oil containing CBD, capsules, dried cannabis, or as a liquid solution. CBD is thought not to be as psychoactive as THC (delta-9-tetrahydrocannabinol - the chemical in marijuana responsible for the "HIGH"). Studies suggest that CBD may interact with different biological target receptors in the body, including cannabinoid and other neurotransmitter receptors. As of 2018 the mechanism of action for its biological effects has not been determined.  Side-effects  Adverse reactions: Dry mouth, diarrhea, decreased appetite, fatigue, drowsiness, malaise, weakness, sleep disturbances, and others.  Drug interactions:  CBD may interact with medications such as blood-thinners. CBD causes drowsiness on its own and it will increase drowsiness caused by other medications, including antihistamines (such as Benadryl), benzodiazepines (Xanax, Ativan, Valium), antipsychotics, antidepressants, opioids, alcohol  and supplements such as kava, melatonin and St. Mattew's Wort.  Other drug interactions: Brivaracetam (Briviact); Caffeine; Carbamazepine (Tegretol); Citalopram (Celexa); Clobazam (Onfi); Eslicarbazepine (Aptiom); Everolimus (Zostress); Lithium; Methadone (Dolophine); Rufinamide (Banzel); Sedative medications (CNS depressants); Sirolimus (Rapamune); Stiripentol (Diacomit); Tacrolimus (Prograf); Tamoxifen ; Soltamox); Topiramate (Topamax); Valproate; Warfarin (Coumadin); Zonisamide. (Last update: 08/13/2022) ____________________________________________________________________________________________   ____________________________________________________________________________________________  Naloxone Nasal Spray  Why am I receiving this medication? [place] STOP ACT requires that all patients taking high dose opioids or at risk of opioids respiratory depression, be prescribed an opioid reversal agent, such as  Naloxone (AKA: Narcan).  What is this medication? NALOXONE (nal OX one) treats opioid overdose, which causes slow or shallow breathing, severe drowsiness, or trouble staying awake. Call emergency services after using this medication. You may need additional treatment. Naloxone works by reversing the effects of opioids. It belongs to a group of medications called opioid blockers.  COMMON BRAND NAME(S): Kloxxado, Narcan  What should I tell my care team before I take this medication? They need to know if you have any of these conditions: Heart disease Substance use disorder An unusual or allergic reaction to naloxone, other medications, foods, dyes, or preservatives Pregnant or trying to get pregnant Breast-feeding  When to use this medication? This medication is to be used for the treatment of respiratory depression (less than 8 breaths per minute) secondary to opioid overdose.   How to use this medication? This medication is for use in the nose. Lay the person on their back.  Support their neck with your hand and allow the head to tilt back before giving the medication. The nasal spray should be given into 1 nostril. After giving the medication, move the person onto their side. Do not remove or test the nasal spray until ready to use. Get emergency medical help right away after giving the first dose of this medication, even if the person wakes up. You should be familiar with how to recognize the signs and symptoms of a narcotic overdose. If more doses are needed, give the additional dose in the other nostril. Talk to your care team about the use of this medication in children. While this medication may be prescribed for children as young as newborns for selected conditions, precautions do apply.  Naloxone Overdosage: If you think you have taken too much of this medicine contact a poison control center or emergency room at once.  NOTE: This medicine is only for you. Do not share this medicine with others.  What if I miss a dose? This does not apply.  What may interact with this medication? This is only used during an emergency. No interactions are expected during emergency use. This list may not describe all possible interactions. Give your health care provider a list of all the medicines, herbs, non-prescription drugs, or dietary supplements you use. Also tell them if you smoke, drink alcohol, or use illegal drugs. Some items may interact with your medicine.  What should I watch for while using this medication? Keep this medication ready for use in the case of an opioid overdose. Make sure that you have the phone number of your care team and local hospital ready. You may need to have additional doses of this medication. Each nasal spray contains a single dose. Some emergencies may require additional doses. After use, bring the treated person to the nearest hospital or call 911. Make sure the treating care team knows that the person has received a dose of this medication.  You will receive additional instructions on what to do during and after use of this medication before an emergency occurs.  What side effects may I notice from receiving this medication? Side effects that you should report to your care team as soon as possible: Allergic reactions--skin rash, itching, hives, swelling of the face, lips, tongue, or throat Side effects that usually do not require medical attention (report these to your care team if they continue or are bothersome): Constipation Dryness or irritation inside the nose Headache Increase in blood pressure Muscle spasms Stuffy nose Toothache This list may not  describe all possible side effects. Call your doctor for medical advice about side effects. You may report side effects to FDA at 1-800-FDA-1088.  Where should I keep my medication? Because this is an emergency medication, you should keep it with you at all times.  Keep out of the reach of children and pets. Store between 20 and 25 degrees C (68 and 77 degrees F). Do not freeze. Throw away any unused medication after the expiration date. Keep in original box until ready to use.  NOTE: This sheet is a summary. It may not cover all possible information. If you have questions about this medicine, talk to your doctor, pharmacist, or health care provider.   2023 Elsevier/Gold Standard (2021-04-28 00:00:00)  ____________________________________________________________________________________________

## 2022-11-21 ENCOUNTER — Encounter (INDEPENDENT_AMBULATORY_CARE_PROVIDER_SITE_OTHER): Payer: Self-pay | Admitting: Nurse Practitioner

## 2022-11-21 NOTE — Progress Notes (Signed)
Subjective:    Patient ID: [name] Seeds., male    DOB: [birth year], 67 y.o.   MRN: [id number] Chief Complaint  Patient presents with   Follow-up    49mo ao-iliac/abi.    The patient returns to the office for followup and review of the noninvasive studies.   There have been no interval changes in lower extremity symptoms. No interval shortening of the patient's claudication distance or development of rest pain symptoms. No new ulcers or wounds have occurred since the last visit.  There have been no significant changes to the patient's overall health care.  The patient denies amaurosis fugax or recent TIA symptoms. There are no documented recent neurological changes noted. There is no history of DVT, PE or superficial thrombophlebitis. The patient denies recent episodes of angina or shortness of breath.   ABI Rt=0.90 and Lt=0.75  (previous ABI's Rt=1.05 and Lt=1.07) Duplex ultrasound of the tibial vessels shows biphasic waveforms with good toe waveforms bilaterally additional studies of the iliac vessels show patent stents.  Elevated velocities in the iliac arteries however these are not yet hemodynamically significant.    Review of Systems  All other systems reviewed and are negative.      Objective:   Physical Exam Vitals reviewed.  HENT:     Head: Normocephalic.  Cardiovascular:     Rate and Rhythm: Normal rate.     Pulses:          Dorsalis pedis pulses are detected w/ Doppler on the right side and detected w/ Doppler on the left side.       Posterior tibial pulses are detected w/ Doppler on the right side and detected w/ Doppler on the left side.  Pulmonary:     Effort: Pulmonary effort is normal.  Skin:    General: Skin is warm and dry.  Neurological:     Mental Status: He is alert and oriented to person, place, and time.  Psychiatric:        Mood and Affect: Mood normal.        Behavior: Behavior normal.        Thought Content: Thought content normal.         Judgment: Judgment normal.     BP (!) 167/80 (BP Location: Right Arm)   Pulse 69   Resp 18   Ht 5\' 3"  (1.6 m)   Wt 167 lb 6.4 oz (75.9 kg)   BMI 29.65 kg/m   Past Medical History:  Diagnosis Date   Benign esophageal stricture    Cardiomyopathy, secondary ([place])    Coronary artery disease    Diabetes mellitus without complication ([place])    Diverticulitis    GI bleed    Gout    Hemorrhoids    Hyperlipidemia    Hypertension    Sleep apnea    Tubular adenoma of colon     Social History   Socioeconomic History   Marital status: Widowed    Spouse name: Not on file   Number of children: Not on file   Years of education: Not on file   Highest education level: Not on file  Occupational History   Occupation: retired  Tobacco Use   Smoking status: Former    Years: 25    Types: Cigarettes   Smokeless tobacco: Former  Substance and Sexual Activity   Alcohol use: Yes    Alcohol/week: 14.0 standard drinks of alcohol    Types: 14 Shots of liquor per week  Comment: couple drinks daily   Drug use: No   Sexual activity: Not on file  Other Topics Concern   Not on file  Social History Narrative   ED visit around the end of October to have chest pain checked which was benign   Social Determinants of Health   Financial Resource Strain: Not on file  Food Insecurity: Not on file  Transportation Needs: Not on file  Physical Activity: Not on file  Stress: Not on file  Social Connections: Not on file  Intimate Partner Violence: Not on file    Past Surgical History:  Procedure Laterality Date   cardiac bypass     CARDIAC CATHETERIZATION Left 02/03/2016   Procedure: Left Heart Cath and Coronary Angiography;  Surgeon: Teodoro Spray, MD;  Location: [place] [facility];  Service: Cardiovascular;  Laterality: Left;   CARDIAC SURGERY     COLONOSCOPY WITH PROPOFOL N/A 04/29/2015   Procedure: COLONOSCOPY WITH PROPOFOL;  Surgeon: Hulen Luster, MD;  Location: [facility] ENDOSCOPY;  Service:  Gastroenterology;  Laterality: N/A;   COLONOSCOPY WITH PROPOFOL N/A 07/20/2020   Procedure: COLONOSCOPY WITH PROPOFOL;  Surgeon: Toledo, Benay Pike, MD;  Location: [facility] ENDOSCOPY;  Service: Gastroenterology;  Laterality: N/A;   CORONARY ANGIOPLASTY WITH STENT PLACEMENT     x2   CORONARY ARTERY BYPASS GRAFT     2013 & 2017   ESOPHAGOGASTRODUODENOSCOPY (EGD) WITH PROPOFOL N/A 04/29/2015   Procedure: ESOPHAGOGASTRODUODENOSCOPY (EGD) WITH PROPOFOL;  Surgeon: Hulen Luster, MD;  Location: [facility] ENDOSCOPY;  Service: Gastroenterology;  Laterality: N/A;   LOWER EXTREMITY ANGIOGRAPHY Left 11/20/2021   Procedure: Lower Extremity Angiography;  Surgeon: Algernon Huxley, MD;  Location: [place] [facility];  Service: Cardiovascular;  Laterality: Left;   SEPTOPLASTY      Family History  Problem Relation Age of Onset   Heart disease Mother    Hypertension Father     No Known Allergies     Latest Ref Rng & Units 04/27/2022    4:42 AM 04/25/2022   11:04 PM 07/16/2021   12:58 PM  CBC  WBC 4.0 - 10.5 K/uL 6.9  7.4  9.6   Hemoglobin 13.0 - 17.0 g/dL 12.4  12.6  14.4   Hematocrit 39.0 - 52.0 % 35.3  36.7  39.7   Platelets 150 - 400 K/uL 173  182  188       CMP     Component Value Date/Time   NA 140 04/27/2022 0442   NA 135 (L) 08/01/2014 2132   K 3.7 04/27/2022 0442   K 3.9 08/01/2014 2132   CL 107 04/27/2022 0442   CL 101 08/01/2014 2132   CO2 26 04/27/2022 0442   CO2 25 08/01/2014 2132   GLUCOSE 126 (H) 04/27/2022 0442   GLUCOSE 237 (H) 08/01/2014 2132   BUN 25 (H) 04/27/2022 0442   BUN 18 08/01/2014 2132   CREATININE 1.02 04/27/2022 0442   CREATININE 1.51 (H) 08/01/2014 2132   CALCIUM 9.2 04/27/2022 0442   CALCIUM 8.5 08/01/2014 2132   PROT 7.6 04/25/2022 2304   PROT 8.4 (H) 05/16/2013 0014   ALBUMIN 4.1 04/25/2022 2304   ALBUMIN 4.5 05/16/2013 0014   AST 52 (H) 04/25/2022 2304   AST 30 05/16/2013 0014   ALT 48 (H) 04/25/2022 2304   ALT 55 05/16/2013 0014   ALKPHOS 88 04/25/2022 2304    ALKPHOS 98 05/16/2013 0014   BILITOT 1.0 04/25/2022 2304   BILITOT 0.8 05/16/2013 0014   GFRNONAA >60 04/27/2022  [place] (L) 08/01/2014 2132   GFRNONAA >60 05/16/2013 0014   GFRAA >60 05/07/2019 1730   GFRAA >60 08/01/2014 2132   GFRAA >60 05/16/2013 0014     VAS [place] ABI WITH/WO TBI  Result Date: 11/05/2022  LOWER EXTREMITY DOPPLER STUDY Patient Name:  [name] [name].  Date of Exam:   11/02/2022 Medical Rec #: [id number]           Accession #:    [id number] Date of Birth: [birth year]            Patient Gender: M Patient Age:   2 years Exam Location:  [facility] Procedure:      VAS [place] ABI WITH/WO TBI Referring Phys: Leotis Pain --------------------------------------------------------------------------------  Indications: Peripheral artery disease.  Vascular Interventions: 11/20/21: Bilateral CIA stents;. Comparison Study: 05/04/2022 Performing Technologist: Almira Coaster RVS  Examination Guidelines: A complete evaluation includes at minimum, Doppler waveform signals and systolic blood pressure reading at the level of bilateral brachial, anterior tibial, and posterior tibial arteries, when vessel segments are accessible. Bilateral testing is considered an integral part of a complete examination. Photoelectric Plethysmograph (PPG) waveforms and toe systolic pressure readings are included as required and additional duplex testing as needed. Limited examinations for reoccurring indications may be performed as noted.  ABI Findings: +---------+------------------+-----+--------+--------+ Right    Rt Pressure (mmHg)IndexWaveformComment  +---------+------------------+-----+--------+--------+ Brachial 163                                     +---------+------------------+-----+--------+--------+ ATA      133               0.82 biphasic         +---------+------------------+-----+--------+--------+ PTA      146               0.90 biphasic          +---------+------------------+-----+--------+--------+ Great Toe157               0.96 Normal           +---------+------------------+-----+--------+--------+ +---------+------------------+-----+--------+-------+ Left     Lt Pressure (mmHg)IndexWaveformComment +---------+------------------+-----+--------+-------+ Brachial 157                                    +---------+------------------+-----+--------+-------+ ATA      118               0.72 biphasic        +---------+------------------+-----+--------+-------+ PTA      122               0.75 biphasic        +---------+------------------+-----+--------+-------+ Great Toe115               0.71 Normal          +---------+------------------+-----+--------+-------+ +-------+-----------+-----------+------------+------------+ ABI/TBIToday's ABIToday's TBIPrevious ABIPrevious TBI +-------+-----------+-----------+------------+------------+ Right  .90        .96        1.05        .88          +-------+-----------+-----------+------------+------------+ Left   .75        .71        1.07        .83          +-------+-----------+-----------+------------+------------+  Bilateral ABIs appear decreased compared to prior study  on 05/04/2022. Right TBIs appear increased compared to prior study on 05/04/2022. Left TBIs appear to be decreased compared to prior study on 05/04/2022.  Summary: Right: Resting right ankle-brachial index indicates mild right lower extremity arterial disease. The right toe-brachial index is normal. Left: Resting left ankle-brachial index indicates moderate left lower extremity arterial disease. The left toe-brachial index is normal. *See table(s) above for measurements and observations.   Electronically signed by Leotis Pain MD on 11/05/2022 at 2:32:03 PM.    Final        Assessment & Plan:   1. Peripheral arterial disease with history of revascularization (HCC)  Recommend:  The patient has  evidence of atherosclerosis of the lower extremities with claudication.  The patient does not voice lifestyle limiting changes at this point in time.  Noninvasive studies do not suggest clinically significant change.  No invasive studies, angiography or surgery at this time The patient should continue walking and begin a more formal exercise program.  The patient should continue antiplatelet therapy and aggressive treatment of the lipid abnormalities  No changes in the patient's medications at this time  Continued surveillance is indicated as atherosclerosis is likely to progress with time.    The patient will continue follow up with noninvasive studies as ordered.   2. Essential hypertension Continue antihypertensive medications as already ordered, these medications have been reviewed and there are no changes at this time.  3. Hyperlipidemia, unspecified hyperlipidemia type Continue statin as ordered and reviewed, no changes at this time   Current Outpatient Medications on File Prior to Visit  Medication Sig Dispense Refill   allopurinol (ZYLOPRIM) 100 MG tablet Take 100 mg by mouth daily.      aspirin EC 81 MG tablet Take 81 mg by mouth daily. Swallow whole.     atorvastatin (LIPITOR) 40 MG tablet Take 40 mg by mouth at bedtime.      B-D UF III MINI PEN NEEDLES 31G X 5 MM MISC 50 mm 2 (two) times daily. 4 mm needles     BD PEN NEEDLE NANO 2ND GEN 32G X 4 MM MISC Inject 1 applicator into the skin in the morning and at bedtime.     Cholecalciferol 25 MCG (1000 UT) tablet Take 2,000 Units by mouth daily.     isosorbide mononitrate (IMDUR) 30 MG 24 hr tablet Take 30 mg by mouth every morning.   1   metoprolol tartrate (LOPRESSOR) 50 MG tablet Take 1 tablet (50 mg total) by mouth 2 (two) times daily. (Patient taking differently: Take 50 mg by mouth 3 (three) times daily.)     Multiple Vitamin (MULTIVITAMIN WITH MINERALS) TABS tablet Take 1 tablet by mouth every evening.     naloxone  (NARCAN) nasal spray 4 mg/0.1 mL Place 1 spray into the nose as needed for up to 365 doses (for opioid-induced respiratory depresssion). In case of emergency (overdose), spray once into each nostril. If no response within 3 minutes, repeat application and call [id number]. 1 each 0   nitroGLYCERIN (NITROSTAT) 0.4 MG SL tablet Place 0.4 mg under the tongue every 5 (five) minutes x 3 doses as needed for chest pain.      OZEMPIC, 1 MG/DOSE, 4 MG/3ML SOPN Inject 1 mg into the skin once a week.     Potassium 99 MG TABS Take 1 tablet by mouth daily.     TRESIBA FLEXTOUCH 100 UNIT/ML FlexTouch Pen Inject 50 Units into the skin 2 (two) times daily.     valsartan (  DIOVAN) 80 MG tablet Take 80 mg by mouth 2 (two) times daily.     gabapentin (NEURONTIN) 600 MG tablet Take 1 tablet (600 mg total) by mouth 2 (two) times daily. 60 tablet 2   losartan (COZAAR) 100 MG tablet Take 1 tablet (100 mg total) by mouth daily. 30 tablet 0   pantoprazole (PROTONIX) 40 MG tablet Take 1 tablet (40 mg total) by mouth daily. 30 tablet 0   No current facility-administered medications on file prior to visit.    There are no Patient Instructions on file for this visit. No follow-ups on file.   Kris Hartmann, NP

## 2022-12-26 DIAGNOSIS — Z719 Counseling, unspecified: Secondary | ICD-10-CM | POA: Insufficient documentation

## 2023-02-10 NOTE — Progress Notes (Unsigned)
PROVIDER NOTE: Information contained herein reflects review and annotations entered in association with encounter. Interpretation of such information and data should be left to medically-trained personnel. Information provided to patient can be located elsewhere in the medical record under "Patient Instructions". Document created using STT-dictation technology, any transcriptional errors that may result from process are unintentional.    Patient: [name] [name].  Service Category: E/M  Provider: Oswaldo Done, MD  DOB: 1956/06/02  DOS: 02/11/2023  Referring Provider: Shane Crutch, PA  MRN: [id number]  Specialty: Interventional Pain Management  PCP: Shane Crutch, PA  Type: Established Patient  Setting: Ambulatory outpatient    Location: Office  Delivery: Face-to-face     HPI  [name], a 67 y.o. year old male, is here today because of his No primary diagnosis found.. [name] primary complain today is No chief complaint on file.  Pertinent problems: [name] has Chronic shoulder pain (3ry area of Pain) (Right); Chronic hip pain (Left); Chronic chest wall pain (1ry area of Pain) (Incisional Midline) (since 04/22/2012); Chronic lower extremity pain (2ry area of Pain) (Bilateral) (R>L); Chronic sacroiliac joint pain (Bilateral) (L>R); Neuropathic pain; Neurogenic pain; Incisional pain; Lumbar facet syndrome (Bilateral) (R>L); Lumbar spondylosis; Keloid skin disorder; Costochondritis; Brachial plexus injury, right; Right sided weakness; Chronic pain syndrome; DDD (degenerative disc disease), lumbosacral; Lumbar facet arthropathy (Multilevel) (Bilateral); 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 Pain Assessment: Severity of   is  reported as a  /10. Location:    / . Onset:  . Quality:  . Timing:  . Modifying factor(s):  Marland Kitchen Vitals:  vitals were not taken for this visit.  BMI: Estimated body mass index is 29.23 kg/m as calculated from the following:   Height as of 11/12/22: 5\' 3"  (1.6 m).   Weight as of 11/12/22: 165 lb (74.8 kg). Last encounter: 11/12/2022. Last procedure: Visit date not found.  Reason for encounter: medication management. ***  Pharmacotherapy Assessment  Analgesic: Hydrocodone/APAP 5/325 mg, 1 tab PO BID (10 mg/day of hydrocodone) MME/day: 10 mg/day.   Monitoring: [place] PMP: PDMP reviewed during this encounter.       Pharmacotherapy: No side-effects or adverse reactions reported. Compliance: No problems identified. Effectiveness: Clinically acceptable.  No notes on file  No results found for: "CBDTHCR" No results found for: "D8THCCBX" No results found for: "D9THCCBX"  UDS:  Summary  Date Value Ref Range Status  05/21/2022 Note  Final    Comment:    ==================================================================== [facility] (MW) ==================================================================== Test                             Result       Flag       Units  Drug Present and Declared for Prescription Verification   Hydrocodone                    1642         EXPECTED   ng/mg creat   Hydromorphone                  324          EXPECTED   ng/mg creat   Dihydrocodeine  62           EXPECTED   ng/mg creat   Norhydrocodone                 1263         EXPECTED   ng/mg creat    Sources of hydrocodone include scheduled prescription medications.    Hydromorphone, dihydrocodeine and norhydrocodone are expected    metabolites of hydrocodone. Hydromorphone and dihydrocodeine are    also available as scheduled prescription medications.  ==================================================================== Test                      Result    Flag   Units      Ref Range    Creatinine              307              mg/dL      >=19 ==================================================================== Declared Medications:  The flagging and interpretation on this report are based on the  following declared medications.  Unexpected results may arise from  inaccuracies in the declared medications.   **Note: The testing scope of this panel includes these medications:   Hydrocodone (Norco)   **Note: The testing scope of this panel does not include the  following reported medications:   Acetaminophen (Norco)  Allopurinol (Zyloprim)  Aspirin  Atorvastatin (Lipitor)  Cholecalciferol  Clopidogrel (Plavix)  Gabapentin (Neurontin)  Insulin (Levemir)  Isosorbide (Imdur)  Losartan (Cozaar)  Meloxicam (Mobic)  Metoprolol (Lopressor)  Multivitamin  Nitroglycerin (Nitrostat)  Pantoprazole (Protonix)  Potassium  Semaglutide (Ozempic) ==================================================================== For clinical consultation, please call [phone]. ====================================================================       ROS  Constitutional: Denies any fever or chills Gastrointestinal: No reported hemesis, hematochezia, vomiting, or acute GI distress Musculoskeletal: Denies any acute onset joint swelling, redness, loss of ROM, or weakness Neurological: No reported episodes of acute onset apraxia, aphasia, dysarthria, agnosia, amnesia, paralysis, loss of coordination, or loss of consciousness  Medication Review  Cholecalciferol, HYDROcodone-acetaminophen, Insulin Pen Needle, Potassium, Semaglutide (1 MG/DOSE), allopurinol, aspirin EC, atorvastatin, clopidogrel, gabapentin, insulin degludec, isosorbide mononitrate, losartan, metoprolol tartrate, multivitamin with minerals, naloxone, nitroGLYCERIN, pantoprazole, and valsartan  History Review  Allergy: [name] has No Known Allergies. Drug: [name]  reports no history of drug use. Alcohol:   reports current alcohol use of about 14.0 standard drinks of alcohol per week. Tobacco:  reports that he has quit smoking. His smoking use included cigarettes. He has quit using smokeless tobacco. Social: [name]  reports that he has quit smoking. His smoking use included cigarettes. He has quit using smokeless tobacco. He reports current alcohol use of about 14.0 standard drinks of alcohol per week. He reports that he does not use drugs. Medical:  has a past medical history of Benign esophageal stricture, Cardiomyopathy, secondary (HCC), Coronary artery disease, Diabetes mellitus without complication (HCC), Diverticulitis, GI bleed, Gout, Hemorrhoids, Hyperlipidemia, Hypertension, Sleep apnea, and Tubular adenoma of colon. Surgical: [name]  has a past surgical history that includes Coronary angioplasty with stent; cardiac bypass; Cardiac surgery; Colonoscopy with propofol (N/A, 04/29/2015); Esophagogastroduodenoscopy (egd) with propofol (N/A, 04/29/2015); Septoplasty; Cardiac catheterization (Left, 02/03/2016); Coronary artery bypass graft; Colonoscopy with propofol (N/A, 07/20/2020); and Lower Extremity Angiography (Left, 11/20/2021). Family: family history includes Heart disease in his mother; Hypertension in his father.  Laboratory Chemistry Profile   Renal Lab Results  Component Value Date   BUN 25 (H) 04/27/2022   CREATININE 1.02 04/27/2022  GFRAA >60 05/07/2019   GFRNONAA >60 04/27/2022    Hepatic Lab Results  Component Value Date   AST 52 (H) 04/25/2022   ALT 48 (H) 04/25/2022   ALBUMIN 4.1 04/25/2022   ALKPHOS 88 04/25/2022   HCVAB NON REACTIVE 10/09/2020   LIPASE 39 07/16/2021    Electrolytes Lab Results  Component Value Date   NA 140 04/27/2022   K 3.7 04/27/2022   CL 107 04/27/2022   CALCIUM 9.2 04/27/2022   MG 2.2 10/09/2020   PHOS 3.7 10/09/2020    Bone Lab Results  Component Value Date   VD25OH 74.7 05/07/2019   VD125OH2TOT 30.8 10/26/2015    Inflammation  (CRP: Acute Phase) (ESR: Chronic Phase) Lab Results  Component Value Date   CRP 0.9 05/07/2019   ESRSEDRATE 41 (H) 05/07/2019   LATICACIDVEN 1.9 10/09/2020         Note: Above Lab results reviewed.  Recent Imaging Review  VAS [place] ABI WITH/WO TBI  LOWER EXTREMITY DOPPLER STUDY  Patient Name:  [name] [name].  Date of Exam:   11/02/2022 Medical Rec #: [id number]           Accession #:    [id number] Date of Birth: Aug 17, 1956            Patient Gender: M Patient Age:   52 years Exam Location:  [facility] Procedure:      VAS [place] ABI WITH/WO TBI Referring Phys: Festus Barren  --------------------------------------------------------------------------------   Indications: Peripheral artery disease.   Vascular Interventions: 11/20/21: Bilateral CIA stents;.  Comparison Study: 05/04/2022  Performing Technologist: Debbe Bales RVS    Examination Guidelines: A complete evaluation includes at minimum, Doppler waveform signals and systolic blood pressure reading at the level of bilateral brachial, anterior tibial, and posterior tibial arteries, when vessel segments are accessible. Bilateral testing is considered an integral part of a complete examination. Photoelectric Plethysmograph (PPG) waveforms and toe systolic pressure readings are included as required and additional duplex testing as needed. Limited examinations for reoccurring indications may be performed as noted.    ABI Findings: +---------+------------------+-----+--------+--------+ Right    Rt Pressure (mmHg)IndexWaveformComment  +---------+------------------+-----+--------+--------+ Brachial 163                                     +---------+------------------+-----+--------+--------+ ATA      133               0.82 biphasic         +---------+------------------+-----+--------+--------+ PTA      146               0.90 biphasic          +---------+------------------+-----+--------+--------+ Great Toe157               0.96 Normal           +---------+------------------+-----+--------+--------+  +---------+------------------+-----+--------+-------+ Left     Lt Pressure (mmHg)IndexWaveformComment +---------+------------------+-----+--------+-------+ Brachial 157                                    +---------+------------------+-----+--------+-------+ ATA      118               0.72 biphasic        +---------+------------------+-----+--------+-------+ PTA      122  0.75 biphasic        +---------+------------------+-----+--------+-------+ Great Toe115               0.71 Normal          +---------+------------------+-----+--------+-------+  +-------+-----------+-----------+------------+------------+ ABI/TBIToday's ABIToday's TBIPrevious ABIPrevious TBI +-------+-----------+-----------+------------+------------+ Right  .90        .96        1.05        .88          +-------+-----------+-----------+------------+------------+ Left   .75        .71        1.07        .83          +-------+-----------+-----------+------------+------------+    Bilateral ABIs appear decreased compared to prior study on 05/04/2022. Right TBIs appear increased compared to prior study on 05/04/2022. Left TBIs appear to be decreased compared to prior study on 05/04/2022.   Summary: Right: Resting right ankle-brachial index indicates mild right lower extremity arterial disease. The right toe-brachial index is normal.  Left: Resting left ankle-brachial index indicates moderate left lower extremity arterial disease. The left toe-brachial index is normal.  *See table(s) above for measurements and observations.      Electronically signed by Festus Barren MD on 11/05/2022 at 2:32:03 PM.      Final   VAS US AORTA/IVC/ILIACS ABDOMINAL AORTA STUDY  Patient Name:  [name] [name].   Date of Exam:   11/02/2022 Medical Rec #: [id number]           Accession #:    [id number] Date of Birth: 06/18/1956            Patient Gender: M Patient Age:   18 years Exam Location:  [facility] Procedure:      VAS US AORTA/IVC/ILIACS Referring Phys: Festus Barren  --------------------------------------------------------------------------------   Indications: F/U Iliac stents    Comparison Study: 05/04/2022  Performing Technologist: Debbe Bales RVS    Examination Guidelines: A complete evaluation includes B-mode imaging, spectral Doppler, color Doppler, and power Doppler as needed of all accessible portions of each vessel. Bilateral testing is considered an integral part of a complete examination. Limited examinations for reoccurring indications may be performed as noted.    Abdominal Aorta Findings: +-------------+-------+----------+----------+----------+--------+--------+ Location     AP (cm)Trans (cm)PSV (cm/s)Waveform  ThrombusComments +-------------+-------+----------+----------+----------+--------+--------+ Proximal     1.49   1.64      78        monophasic                 +-------------+-------+----------+----------+----------+--------+--------+ Mid          1.51   1.65      69        monophasic                 +-------------+-------+----------+----------+----------+--------+--------+ Distal       1.48   1.43      69        monophasic                 +-------------+-------+----------+----------+----------+--------+--------+ RT CIA Prox  0.9    1.0       194       biphasic                   +-------------+-------+----------+----------+----------+--------+--------+ RT CIA Distal1.0    1.0       90        biphasic                   +-------------+-------+----------+----------+----------+--------+--------+  RT EIA Prox  0.8    0.8       118       biphasic                    +-------------+-------+----------+----------+----------+--------+--------+ RT EIA Distal0.8    0.7       86        biphasic                   +-------------+-------+----------+----------+----------+--------+--------+ LT CIA Prox  0.6    0.7       179       biphasic                   +-------------+-------+----------+----------+----------+--------+--------+ LT CIA Distal0.6    0.6       128       biphasic                   +-------------+-------+----------+----------+----------+--------+--------+ LT EIA Prox  0.8    0.8       81        biphasic                   +-------------+-------+----------+----------+----------+--------+--------+ LT EIA Distal0.8    0.7       89        biphasic                   +-------------+-------+----------+----------+----------+--------+--------+     Summary: Abdominal Aorta: No evidence of an abdominal aortic aneurysm was visualized. The largest aortic measurement is 1.6 cm. Stenosis: The Aorta, CIA and EIA was imaged and Waveforms obtained; Increased Velocities seen in the Right and Left CIA Proximal segment.    *See table(s) above for measurements and observations.   Electronically signed by Festus Barren MD on 11/05/2022 at 2:31:57 PM.       Final   Note: Reviewed        Physical Exam  General appearance: Well nourished, well developed, and well hydrated. In no apparent acute distress Mental status: Alert, oriented x 3 (person, place, & time)       Respiratory: No evidence of acute respiratory distress Eyes: PERLA Vitals: There were no vitals taken for this visit. BMI: Estimated body mass index is 29.23 kg/m as calculated from the following:   Height as of 11/12/22: 5\' 3"  (1.6 m).   Weight as of 11/12/22: 165 lb (74.8 kg). Ideal: Patient weight not recorded  Assessment   Diagnosis Status  No diagnosis found. Controlled Controlled Controlled   Updated Problems: No problems updated.  Plan of Care   Problem-specific:  No problem-specific Assessment & Plan notes found for this encounter.  [name] has a current medication list which includes the following long-term medication(s): allopurinol, atorvastatin, gabapentin, hydrocodone-acetaminophen, hydrocodone-acetaminophen, hydrocodone-acetaminophen, isosorbide mononitrate, losartan, metoprolol tartrate, pantoprazole, and valsartan.  Pharmacotherapy (Medications Ordered): No orders of the defined types were placed in this encounter.  Orders:  No orders of the defined types were placed in this encounter.  Follow-up plan:   No follow-ups on file.      Interventional Therapies  Risk Factors  Considerations:      Planned  Pending:      Under consideration:   Therapeutic bilateral Lumbar facet + bilateral sacroiliac joint RFA #1    Completed:   Palliative left IA hip inj. x1 (05/13/2018) (100/100/75/>75)  Palliative bilateral lumbar facet MBB x2 (02/21/2016)  Palliative bilateral SI Blk x1 (02/21/2016)  Therapeutic  Palliative (PRN) options:   Palliative left intra-articular hip joint injection #2  Palliative bilateral lumbar facet block #3  Palliative bilateral sacroiliac joint block #2    Pharmacotherapy  Nonopioids transfer 07/25/2020: Gabapentin        Recent Visits Date Type Provider Dept  11/12/22 Office Visit Delano Metz, MD [facility]  Showing recent visits within past 90 days and meeting all other requirements Future Appointments Date Type Provider Dept  02/11/23 Appointment Delano Metz, MD [facility]  Showing future appointments within next 90 days and meeting all other requirements  I discussed the assessment and treatment plan with the patient. The patient was provided an opportunity to ask questions and all were answered. The patient agreed with the plan and demonstrated an understanding of the instructions.  Patient advised to call back or seek an  in-person evaluation if the symptoms or condition worsens.  Duration of encounter: *** minutes.  Total time on encounter, as per AMA guidelines included both the face-to-face and non-face-to-face time personally spent by the physician and/or other qualified health care professional(s) on the day of the encounter (includes time in activities that require the physician or other qualified health care professional and does not include time in activities normally performed by clinical staff). Physician's time may include the following activities when performed: Preparing to see the patient (e.g., pre-charting review of records, searching for previously ordered imaging, lab work, and nerve conduction tests) Review of prior analgesic pharmacotherapies. Reviewing PMP Interpreting ordered tests (e.g., lab work, imaging, nerve conduction tests) Performing post-procedure evaluations, including interpretation of diagnostic procedures Obtaining and/or reviewing separately obtained history Performing a medically appropriate examination and/or evaluation Counseling and educating the patient/family/caregiver Ordering medications, tests, or procedures Referring and communicating with other health care professionals (when not separately reported) Documenting clinical information in the electronic or other health record Independently interpreting results (not separately reported) and communicating results to the patient/ family/caregiver Care coordination (not separately reported)  Note by: Oswaldo Done, MD Date: 02/11/2023; Time: 6:53 PM

## 2023-02-11 ENCOUNTER — Ambulatory Visit: Payer: Medicare Other | Attending: Pain Medicine | Admitting: Pain Medicine

## 2023-02-11 VITALS — BP 140/73 | HR 77 | Temp 97.2°F | Resp 16 | Ht 63.0 in | Wt 165.0 lb

## 2023-02-11 DIAGNOSIS — M25511 Pain in right shoulder: Secondary | ICD-10-CM | POA: Diagnosis present

## 2023-02-11 DIAGNOSIS — Z79899 Other long term (current) drug therapy: Secondary | ICD-10-CM | POA: Diagnosis present

## 2023-02-11 DIAGNOSIS — R0789 Other chest pain: Secondary | ICD-10-CM | POA: Insufficient documentation

## 2023-02-11 DIAGNOSIS — Z79891 Long term (current) use of opiate analgesic: Secondary | ICD-10-CM | POA: Insufficient documentation

## 2023-02-11 DIAGNOSIS — M545 Low back pain, unspecified: Secondary | ICD-10-CM | POA: Diagnosis present

## 2023-02-11 DIAGNOSIS — G894 Chronic pain syndrome: Secondary | ICD-10-CM | POA: Diagnosis present

## 2023-02-11 DIAGNOSIS — M79604 Pain in right leg: Secondary | ICD-10-CM | POA: Insufficient documentation

## 2023-02-11 DIAGNOSIS — M47816 Spondylosis without myelopathy or radiculopathy, lumbar region: Secondary | ICD-10-CM | POA: Diagnosis present

## 2023-02-11 DIAGNOSIS — M79605 Pain in left leg: Secondary | ICD-10-CM | POA: Insufficient documentation

## 2023-02-11 DIAGNOSIS — G8929 Other chronic pain: Secondary | ICD-10-CM | POA: Insufficient documentation

## 2023-02-11 MED ORDER — HYDROCODONE-ACETAMINOPHEN 5-325 MG PO TABS: 1.0000 | ORAL_TABLET | Freq: Two times a day (BID) | ORAL | 0 refills | Status: DC | PRN

## 2023-02-11 NOTE — Patient Instructions (Signed)
____________________________________________________________________________________________  Opioid Pain Medication Update  To: All patients taking opioid pain medications. (I.e.: hydrocodone, hydromorphone, oxycodone, oxymorphone, morphine, codeine, methadone, tapentadol, tramadol, buprenorphine, fentanyl, etc.)  Re: Updated review of side effects and adverse reactions of opioid analgesics, as well as new information about long term effects of this class of medications.  Direct risks of long-term opioid therapy are not limited to opioid addiction and overdose. Potential medical risks include serious fractures, breathing problems during sleep, hyperalgesia, immunosuppression, chronic constipation, bowel obstruction, myocardial infarction, and tooth decay secondary to xerostomia.  Unpredictable adverse effects that can occur even if you take your medication correctly: Cognitive impairment, respiratory depression, and death. Most people think that if they take their medication "correctly", and "as instructed", that they will be safe. Nothing could be farther from the truth. In reality, a significant amount of recorded deaths associated with the use of opioids has occurred in individuals that had taken the medication for a long time, and were taking their medication correctly. The following are examples of how this can happen: Patient taking his/her medication for a long time, as instructed, without any side effects, is given a certain antibiotic or another unrelated medication, which in turn triggers a "Drug-to-drug interaction" leading to disorientation, cognitive impairment, impaired reflexes, respiratory depression or an untoward event leading to serious bodily harm or injury, including death.  Patient taking his/her medication for a long time, as instructed, without any side effects, develops an acute impairment of liver and/or kidney function. This will lead to a rapid inability of the body to  breakdown and eliminate their pain medication, which will result in effects similar to an "overdose", but with the same medicine and dose that they had always taken. This again may lead to disorientation, cognitive impairment, impaired reflexes, respiratory depression or an untoward event leading to serious bodily harm or injury, including death.  A similar problem will occur with patients as they grow older and their liver and kidney function begins to decrease as part of the aging process.  Background information: Historically, the original case for using long-term opioid therapy to treat chronic noncancer pain was based on safety assumptions that subsequent experience has called into question. In 1996, the American Pain Society and the American Academy of Pain Medicine issued a consensus statement supporting long-term opioid therapy. This statement acknowledged the dangers of opioid prescribing but concluded that the risk for addiction was low; respiratory depression induced by opioids was short-lived, occurred mainly in opioid-naive patients, and was antagonized by pain; tolerance was not a common problem; and efforts to control diversion should not constrain opioid prescribing. This has now proven to be wrong. Experience regarding the risks for opioid addiction, misuse, and overdose in community practice has failed to support these assumptions.  According to the Centers for Disease Control and Prevention, fatal overdoses involving opioid analgesics have increased sharply over the past decade. Currently, more than 96,700 people die from drug overdoses every year. Opioids are a factor in 7 out of every 10 overdose deaths. Deaths from drug overdose have surpassed motor vehicle accidents as the leading cause of death for individuals between the ages of 35 and 54.  Clinical data suggest that neuroendocrine dysfunction may be very common in both men and women, potentially causing hypogonadism, erectile  dysfunction, infertility, decreased libido, osteoporosis, and depression. Recent studies linked higher opioid dose to increased opioid-related mortality. Controlled observational studies reported that long-term opioid therapy may be associated with increased risk for cardiovascular events. Subsequent meta-analysis concluded   that the safety of long-term opioid therapy in elderly patients has not been proven.   Side Effects and adverse reactions: Common side effects: Drowsiness (sedation). Dizziness. Nausea and vomiting. Constipation. Physical dependence -- Dependence often manifests with withdrawal symptoms when opioids are discontinued or decreased. Tolerance -- As you take repeated doses of opioids, you require increased medication to experience the same effect of pain relief. Respiratory depression -- This can occur in healthy people, especially with higher doses. However, people with COPD, asthma or other lung conditions may be even more susceptible to fatal respiratory impairment.  Uncommon side effects: An increased sensitivity to feeling pain and extreme response to pain (hyperalgesia). Chronic use of opioids can lead to this. Delayed gastric emptying (the process by which the contents of your stomach are moved into your small intestine). Muscle rigidity. Immune system and hormonal dysfunction. Quick, involuntary muscle jerks (myoclonus). Arrhythmia. Itchy skin (pruritus). Dry mouth (xerostomia).  Long-term side effects: Chronic constipation. Sleep-disordered breathing (SDB). Increased risk of bone fractures. Hypothalamic-pituitary-adrenal dysregulation. Increased risk of overdose.  RISKS: Fractures and Falls:  Opioids increase the risk and incidence of falls. This is of particular importance in elderly patients.  Endocrine System:  Long-term administration is associated with endocrine abnormalities (endocrinopathies). (Also known as Opioid-induced Endocrinopathy) Influences  on both the hypothalamic-pituitary-adrenal axis?and the hypothalamic-pituitary-gonadal axis have been demonstrated with consequent hypogonadism and adrenal insufficiency in both sexes. Hypogonadism and decreased levels of dehydroepiandrosterone sulfate have been reported in men and women. Endocrine effects include: Amenorrhoea in women (abnormal absence of menstruation) Reduced libido in both sexes Decreased sexual function Erectile dysfunction in men Hypogonadisms (decreased testicular function with shrinkage of testicles) Infertility Depression and fatigue Loss of muscle mass Anxiety Depression Immune suppression Hyperalgesia Weight gain Anemia Osteoporosis Patients (particularly women of childbearing age) should avoid opioids. There is insufficient evidence to recommend routine monitoring of asymptomatic patients taking opioids in the long-term for hormonal deficiencies.  Immune System: Human studies have demonstrated that opioids have an immunomodulating effect. These effects are mediated via opioid receptors both on immune effector cells and in the central nervous system. Opioids have been demonstrated to have adverse effects on antimicrobial response and anti-tumour surveillance. Buprenorphine has been demonstrated to have no impact on immune function.  Opioid Induced Hyperalgesia: Human studies have demonstrated that prolonged use of opioids can lead to a state of abnormal pain sensitivity, sometimes called opioid induced hyperalgesia (OIH). Opioid induced hyperalgesia is not usually seen in the absence of tolerance to opioid analgesia. Clinically, hyperalgesia may be diagnosed if the patient on long-term opioid therapy presents with increased pain. This might be qualitatively and anatomically distinct from pain related to disease progression or to breakthrough pain resulting from development of opioid tolerance. Pain associated with hyperalgesia tends to be more diffuse than the  pre-existing pain and less defined in quality. Management of opioid induced hyperalgesia requires opioid dose reduction.  Cancer: Chronic opioid therapy has been associated with an increased risk of cancer among noncancer patients with chronic pain. This association was more evident in chronic strong opioid users. Chronic opioid consumption causes significant pathological changes in the small intestine and colon. Epidemiological studies have found that there is a link between opium dependence and initiation of gastrointestinal cancers. Cancer is the second leading cause of death after cardiovascular disease. Chronic use of opioids can cause multiple conditions such as GERD, immunosuppression and renal damage as well as carcinogenic effects, which are associated with the incidence of cancers.   Mortality: Long-term opioid use   has been associated with increased mortality among patients with chronic non-cancer pain (CNCP).  Prescription of long-acting opioids for chronic noncancer pain was associated with a significantly increased risk of all-cause mortality, including deaths from causes other than overdose.  Reference: Von Korff M, Kolodny A, Deyo RA, Chou R. Long-term opioid therapy reconsidered. Ann Intern Med. 2011 Sep 6;155(5):325-8. doi: 10.7326/[id number]. PMID: 16109604; PMCID: [id number]. Randon Goldsmith, Hayward RA, Dunn KM, [place] KP. Risk of adverse events in patients prescribed long-term opioids: A cohort study in the [place] [facility] Research Datalink. Eur J Pain. 2019 May;23(5):908-922. doi: 10.1002/ejp.1357. Epub 2019 Jan 31. PMID: 78295621. Colameco S, Coren JS, Ciervo CA. Continuous opioid treatment for chronic noncancer pain: a time for moderation in prescribing. Postgrad Med. 2009 Jul;121(4):61-6. doi: 10.3810/pgm.2009.07.2032. PMID: 30865784. William Hamburger RN, Durand SD, Blazina I, Cristopher Peru, Bougatsos C, Deyo RA. The  effectiveness and risks of long-term opioid therapy for chronic pain: a systematic review for a [employer] of [employer] Pathways to [employer]. Ann Intern Med. 2015 Feb 17;162(4):276-86. doi: 10.7326/M14-2559. PMID: 69629528. Caryl Bis [facility], [place] DM. NCHS Data Brief No. 22. [place]: Centers for Disease Control and Prevention; 2009. Sep, Increase in Fatal Poisonings Involving Opioid Analgesics in the [place], 1999-2006. Song IA, Choi HR, Oh TK. Long-term opioid use and mortality in patients with chronic non-cancer pain: Ten-year follow-up study in [place] from 2010 through 2019. EClinicalMedicine. 2022 Jul 18;51:101558. doi: 10.1016/j.eclinm.2022.413244. PMID: 01027253; PMCID: [id number]. Huser, W., Schubert, T., Vogelmann, T. et al. All-cause mortality in patients with long-term opioid therapy compared with non-opioid analgesics for chronic non-cancer pain: a database study. BMC Med 18, 162 (2020). http://lester.info/ Rashidian H, Karie Kirks, Malekzadeh R, Haghdoost AA. An Ecological Study of the Association between Opiate Use and Incidence of Cancers. Addict Health. 2016 Fall;8(4):252-260. PMID: 59563875; PMCID: [id number].  Our Goal: Our goal is to control your pain with means other than the use of opioid pain medications.  Our Recommendation: Talk to your physician about coming off of these medications. We can assist you with the tapering down and stopping these medicines. Based on the new information, even if you cannot completely stop the medication, a decrease in the dose may be associated with a lesser risk. Ask for other means of controlling the pain. Decrease or eliminate those factors that significantly contribute to your pain such as smoking, obesity, and a diet heavily tilted towards "inflammatory" nutrients.  Last Updated: 10/31/2022    ____________________________________________________________________________________________     ____________________________________________________________________________________________  Transfer of Pain Medication between Pharmacies  Re: 2023 DEA Clarification on existing regulation  Published on DEA Website: May 04, 2022  Title: Revised Regulation Allows DEA-Registered Pharmacies to Electrical engineer Prescriptions at a Patient's Request DEA Headquarters Division - [employer]  "Patients now have the ability to request their electronic prescription be transferred to another pharmacy without having to go back to their practitioner to initiate the request. This revised regulation went into effect on Monday, April 30, 2022.     At a patient's request, a DEA-registered retail pharmacy can now transfer an electronic prescription for a controlled substance (schedules II-V) to another DEA-registered retail pharmacy. Prior to this change, patients would have to go through their practitioner to cancel their prescription and have it re-issued to a different pharmacy. The process was taxing and time consuming for both patients and practitioners.    The Drug Enforcement Administration [facility]) published its intent to revise the  process for transferring electronic prescriptions on July 22, 2020.  The final rule was published in the federal register on March 29, 2022 and went into effect 30 days later.  Under the final rule, a prescription can only be transferred once between pharmacies, and only if allowed under existing state or other applicable law. The prescription must remain in its electronic form; may not be altered in any way; and the transfer must be communicated directly between two licensed pharmacists. It's important to note, any authorized refills transfer with the original prescription, which means the entire prescription will be filled at the same pharmacy."     REFERENCES: 1. DEA website announcement HugeHand.is  2. Department of Justice website  CheapWipes.at.pdf  3. DEPARTMENT OF JUSTICE Drug Enforcement Administration 21 CFR Part 1306 [Docket No. [id number]-637] RIN [id number]-[id number] "Transfer of Electronic Prescriptions for Schedules II-V Controlled Substances Between Pharmacies for Initial Filling"  ____________________________________________________________________________________________     _______________________________________________________________________  Medication Rules  Purpose: To inform patients, and their family members, of our medication rules and regulations.  Applies to: All patients receiving prescriptions from our practice (written or electronic).  Pharmacy of record: This is the pharmacy where your electronic prescriptions will be sent. Make sure we have the correct one.  Electronic prescriptions: In compliance with the [facility] Strengthen Opioid Misuse Prevention (STOP) Act of 2017 (Session Conni Elliot [phone]), effective September 03, 2018, all controlled substances must be electronically prescribed. Written prescriptions, faxing, or calling prescriptions to a pharmacy will no longer be done.  Prescription refills: These will be provided only during in-person appointments. No medications will be renewed without a "face-to-face" evaluation with your provider. Applies to all prescriptions.  NOTE: The following applies primarily to controlled substances (Opioid* Pain Medications).   Type of encounter (visit): For patients receiving controlled substances, face-to-face visits are required. (Not an option and not up to the patient.)  Patient's responsibilities: Pain Pills: Bring all pain pills to every appointment (except for procedure appointments). Pill Bottles: Bring  pills in original pharmacy bottle. Bring bottle, even if empty. Always bring the bottle of the most recent fill.  Medication refills: You are responsible for knowing and keeping track of what medications you are taking and when is it that you will need a refill. The day before your appointment: write a list of all prescriptions that need to be refilled. The day of the appointment: give the list to the admitting nurse. Prescriptions will be written only during appointments. No prescriptions will be written on procedure days. If you forget a medication: it will not be "Called in", "Faxed", or "electronically sent". You will need to get another appointment to get these prescribed. No early refills. Do not call asking to have your prescription filled early. Partial  or short prescriptions: Occasionally your pharmacy may not have enough pills to fill your prescription.  NEVER ACCEPT a partial fill or a prescription that is short of the total amount of pills that you were prescribed.  With controlled substances the law allows 72 hours for the pharmacy to complete the prescription.  If the prescription is not completed within 72 hours, the pharmacist will require a new prescription to be written. This means that you will be short on your medicine and we WILL NOT send another prescription to complete your original prescription.  Instead, request the pharmacy to send a carrier to a nearby branch to get enough medication to provide you with your full prescription. Prescription Accuracy: You are responsible for carefully inspecting your  prescriptions before leaving our office. Have the discharge nurse carefully go over each prescription with you, before taking them home. Make sure that your name is accurately spelled, that your address is correct. Check the name and dose of your medication to make sure it is accurate. Check the number of pills, and the written instructions to make sure they are clear and accurate. Make  sure that you are given enough medication to last until your next medication refill appointment. Taking Medication: Take medication as prescribed. When it comes to controlled substances, taking less pills or less frequently than prescribed is permitted and encouraged. Never take more pills than instructed. Never take the medication more frequently than prescribed.  Inform other Doctors: Always inform, all of your healthcare providers, of all the medications you take. Pain Medication from other Providers: You are not allowed to accept any additional pain medication from any other Doctor or Healthcare provider. There are two exceptions to this rule. (see below) In the event that you require additional pain medication, you are responsible for notifying us, as stated below. Cough Medicine: Often these contain an opioid, such as codeine or hydrocodone. Never accept or take cough medicine containing these opioids if you are already taking an opioid* medication. The combination may cause respiratory failure and death. Medication Agreement: You are responsible for carefully reading and following our Medication Agreement. This must be signed before receiving any prescriptions from our practice. Safely store a copy of your signed Agreement. Violations to the Agreement will result in no further prescriptions. (Additional copies of our Medication Agreement are available upon request.) Laws, Rules, & Regulations: All patients are expected to follow all [address] and [employer], Rules, [place] [employer]. Ignorance of the Laws does not constitute a valid excuse.  Illegal drugs and Controlled Substances: The use of illegal substances (including, but not limited to marijuana and its derivatives) and/or the illegal use of any controlled substances is strictly prohibited. Violation of this rule may result in the immediate and permanent discontinuation of any and all prescriptions being written by our practice. The use of  any illegal substances is prohibited. Adopted CDC guidelines & recommendations: Target dosing levels will be at or below 60 MME/day. Use of benzodiazepines** is not recommended.  Exceptions: There are only two exceptions to the rule of not receiving pain medications from other Healthcare Providers. Exception #1 (Emergencies): In the event of an emergency (i.e.: accident requiring emergency care), you are allowed to receive additional pain medication. However, you are responsible for: As soon as you are able, call our office [phone], at any time of the day or night, and leave a message stating your name, the date and nature of the emergency, and the name and dose of the medication prescribed. In the event that your call is answered by a member of our staff, make sure to document and save the date, time, and the name of the person that took your information.  Exception #2 (Planned Surgery): In the event that you are scheduled by another doctor or dentist to have any type of surgery or procedure, you are allowed (for a period no longer than 30 days), to receive additional pain medication, for the acute post-op pain. However, in this case, you are responsible for picking up a copy of our "Post-op Pain Management for Surgeons" handout, and giving it to your surgeon or dentist. This document is available at our office, and does not require an appointment to obtain it. Simply go to  our office during business hours (Monday-Thursday from 8:00 AM to 4:00 PM) (Friday 8:00 AM to 12:00 Noon) or if you have a scheduled appointment with [place], prior to your surgery, and ask for it by name. In addition, you are responsible for: calling our office [phone], at any time of the day or night, and leaving a message stating your name, name of your surgeon, type of surgery, and date of procedure or surgery. Failure to comply with your responsibilities may result in termination of therapy involving the controlled  substances. Medication Agreement Violation. Following the above rules, including your responsibilities will help you in avoiding a Medication Agreement Violation ("Breaking your Pain Medication Contract").  Consequences:  Not following the above rules may result in permanent discontinuation of medication prescription therapy.  *Opioid medications include: morphine, codeine, oxycodone, oxymorphone, hydrocodone, hydromorphone, meperidine, tramadol, tapentadol, buprenorphine, fentanyl, methadone. **Benzodiazepine medications include: diazepam (Valium), alprazolam (Xanax), clonazepam (Klonopine), lorazepam (Ativan), clorazepate (Tranxene), chlordiazepoxide (Librium), estazolam (Prosom), oxazepam (Serax), temazepam (Restoril), triazolam (Halcion) (Last updated: 06/26/2022) ______________________________________________________________________    ______________________________________________________________________  Medication Recommendations and Reminders  Applies to: All patients receiving prescriptions (written and/or electronic).  Medication Rules & Regulations: You are responsible for reading, knowing, and following our "Medication Rules" document. These exist for your safety and that of others. They are not flexible and neither are we. Dismissing or ignoring them is an act of "non-compliance" that may result in complete and irreversible termination of such medication therapy. For safety reasons, "non-compliance" will not be tolerated. As with the [place] fundamental legal principle of "ignorance of the law is no defense", we will accept no excuses for not having read and knowing the content of documents provided to you by our practice.  Pharmacy of record:  Definition: This is the pharmacy where your electronic prescriptions will be sent.  We do not endorse any particular pharmacy. It is up to you and your insurance to decide what pharmacy to use.  We do not restrict you in your choice of  pharmacy. However, once we write for your prescriptions, we will NOT be re-sending more prescriptions to fix restricted supply problems created by your pharmacy, or your insurance.  The pharmacy listed in the electronic medical record should be the one where you want electronic prescriptions to be sent. If you choose to change pharmacy, simply notify our nursing staff. Changes will be made only during your regular appointments and not over the phone.  Recommendations: Keep all of your pain medications in a safe place, under lock and key, even if you live alone. We will NOT replace lost, stolen, or damaged medication. We do not accept "Police Reports" as proof of medications having been stolen. After you fill your prescription, take 1 week's worth of pills and put them away in a safe place. You should keep a separate, properly labeled bottle for this purpose. The remainder should be kept in the original bottle. Use this as your primary supply, until it runs out. Once it's gone, then you know that you have 1 week's worth of medicine, and it is time to come in for a prescription refill. If you do this correctly, it is unlikely that you will ever run out of medicine. To make sure that the above recommendation works, it is very important that you make sure your medication refill appointments are scheduled at least 1 week before you run out of medicine. To do this in an effective manner, make sure that you do not leave the office without  scheduling your next medication management appointment. Always ask the nursing staff to show you in your prescription , when your medication will be running out. Then arrange for the receptionist to get you a return appointment, at least 7 days before you run out of medicine. Do not wait until you have 1 or 2 pills left, to come in. This is very poor planning and does not take into consideration that we may need to cancel appointments due to bad weather, sickness, or emergencies  affecting our staff. DO NOT ACCEPT A "Partial Fill": If for any reason your pharmacy does not have enough pills/tablets to completely fill or refill your prescription, do not allow for a "partial fill". The law allows the pharmacy to complete that prescription within 72 hours, without requiring a new prescription. If they do not fill the rest of your prescription within those 72 hours, you will need a separate prescription to fill the remaining amount, which we will NOT provide. If the reason for the partial fill is your insurance, you will need to talk to the pharmacist about payment alternatives for the remaining tablets, but again, DO NOT ACCEPT A PARTIAL FILL, unless you can trust your pharmacist to obtain the remainder of the pills within 72 hours.  Prescription refills and/or changes in medication(s):  Prescription refills, and/or changes in dose or medication, will be conducted only during scheduled medication management appointments. (Applies to both, written and electronic prescriptions.) No refills on procedure days. No medication will be changed or started on procedure days. No changes, adjustments, and/or refills will be conducted on a procedure day. Doing so will interfere with the diagnostic portion of the procedure. No phone refills. No medications will be "called into the pharmacy". No Fax refills. No weekend refills. No Holliday refills. No after hours refills.  Remember:  Business hours are:  Monday to Thursday 8:00 AM to 4:00 PM Provider's Schedule: Delano Metz, MD - Appointments are:  Medication management: Monday and Wednesday 8:00 AM to 4:00 PM Procedure day: Tuesday and Thursday 7:30 AM to 4:00 PM Edward Jolly, MD - Appointments are:  Medication management: Tuesday and Thursday 8:00 AM to 4:00 PM Procedure day: Monday and Wednesday 7:30 AM to 4:00 PM (Last update: 06/26/2022) ______________________________________________________________________    ____________________________________________________________________________________________  Naloxone Nasal Spray  Why am I receiving this medication? [place] STOP ACT requires that all patients taking high dose opioids or at risk of opioids respiratory depression, be prescribed an opioid reversal agent, such as Naloxone (AKA: Narcan).  What is this medication? NALOXONE (nal OX one) treats opioid overdose, which causes slow or shallow breathing, severe drowsiness, or trouble staying awake. Call emergency services after using this medication. You may need additional treatment. Naloxone works by reversing the effects of opioids. It belongs to a group of medications called opioid blockers.  COMMON BRAND NAME(S): Kloxxado, Narcan  What should I tell my care team before I take this medication? They need to know if you have any of these conditions: Heart disease Substance use disorder An unusual or allergic reaction to naloxone, other medications, foods, dyes, or preservatives Pregnant or trying to get pregnant Breast-feeding  When to use this medication? This medication is to be used for the treatment of respiratory depression (less than 8 breaths per minute) secondary to opioid overdose.   How to use this medication? This medication is for use in the nose. Lay the person on their back. Support their neck with your hand and allow the head to tilt  back before giving the medication. The nasal spray should be given into 1 nostril. After giving the medication, move the person onto their side. Do not remove or test the nasal spray until ready to use. Get emergency medical help right away after giving the first dose of this medication, even if the person wakes up. You should be familiar with how to recognize the signs and symptoms of a narcotic overdose. If more doses are needed, give the additional dose in the other nostril. Talk to your care team about the use of this medication in children.  While this medication may be prescribed for children as young as newborns for selected conditions, precautions do apply.  Naloxone Overdosage: If you think you have taken too much of this medicine contact a poison control center or emergency room at once.  NOTE: This medicine is only for you. Do not share this medicine with others.  What if I miss a dose? This does not apply.  What may interact with this medication? This is only used during an emergency. No interactions are expected during emergency use. This list may not describe all possible interactions. Give your health care provider a list of all the medicines, herbs, non-prescription drugs, or dietary supplements you use. Also tell them if you smoke, drink alcohol, or use illegal drugs. Some items may interact with your medicine.  What should I watch for while using this medication? Keep this medication ready for use in the case of an opioid overdose. Make sure that you have the phone number of your care team and local hospital ready. You may need to have additional doses of this medication. Each nasal spray contains a single dose. Some emergencies may require additional doses. After use, bring the treated person to the nearest hospital or call 911. Make sure the treating care team knows that the person has received a dose of this medication. You will receive additional instructions on what to do during and after use of this medication before an emergency occurs.  What side effects may I notice from receiving this medication? Side effects that you should report to your care team as soon as possible: Allergic reactions--skin rash, itching, hives, swelling of the face, lips, tongue, or throat Side effects that usually do not require medical attention (report these to your care team if they continue or are bothersome): Constipation Dryness or irritation inside the nose Headache Increase in blood pressure Muscle spasms Stuffy  nose Toothache This list may not describe all possible side effects. Call your doctor for medical advice about side effects. You may report side effects to FDA at 1-800-FDA-1088.  Where should I keep my medication? Because this is an emergency medication, you should keep it with you at all times.  Keep out of the reach of children and pets. Store between 20 and 25 degrees C (68 and 77 degrees F). Do not freeze. Throw away any unused medication after the expiration date. Keep in original box until ready to use.  NOTE: This sheet is a summary. It may not cover all possible information. If you have questions about this medicine, talk to your doctor, pharmacist, or health care provider.   2023 Elsevier/Gold Standard (2021-04-28 00:00:00)  ____________________________________________________________________________________________

## 2023-02-11 NOTE — Progress Notes (Signed)
Nursing Pain Medication Assessment:  Safety precautions to be maintained throughout the outpatient stay will include: orient to surroundings, keep bed in low position, maintain call bell within reach at all times, provide assistance with transfer out of bed and ambulation.  Medication Inspection Compliance: [name] [name] did not comply with our request to bring his pills to be counted. He was reminded that bringing the medication bottles, even when empty, is a requirement.  Medication: None brought in. Pill/Patch Count: None available to be counted. Bottle Appearance: No container available. Did not bring bottle(s) to appointment. Filled Date: N/A Last Medication intake:  Today

## 2023-02-13 ENCOUNTER — Encounter: Payer: Medicare Other | Admitting: Pain Medicine

## 2023-02-14 LAB — TOXASSURE SELECT 13 (MW), URINE

## 2023-02-22 ENCOUNTER — Emergency Department: Payer: Medicare Other

## 2023-02-22 ENCOUNTER — Inpatient Hospital Stay
Admission: EM | Admit: 2023-02-22 | Discharge: 2023-02-25 | DRG: 303 | Disposition: A | Discharge status: Home / Self Care | Payer: Medicare Other | Attending: Internal Medicine | Admitting: Internal Medicine

## 2023-02-22 ENCOUNTER — Other Ambulatory Visit: Payer: Self-pay

## 2023-02-22 DIAGNOSIS — E11649 Type 2 diabetes mellitus with hypoglycemia without coma: Secondary | ICD-10-CM | POA: Diagnosis present

## 2023-02-22 DIAGNOSIS — M792 Neuralgia and neuritis, unspecified: Secondary | ICD-10-CM

## 2023-02-22 DIAGNOSIS — Z794 Long term (current) use of insulin: Secondary | ICD-10-CM

## 2023-02-22 DIAGNOSIS — G4733 Obstructive sleep apnea (adult) (pediatric): Secondary | ICD-10-CM | POA: Diagnosis present

## 2023-02-22 DIAGNOSIS — I5032 Chronic diastolic (congestive) heart failure: Secondary | ICD-10-CM | POA: Diagnosis present

## 2023-02-22 DIAGNOSIS — Z7902 Long term (current) use of antithrombotics/antiplatelets: Secondary | ICD-10-CM

## 2023-02-22 DIAGNOSIS — Z87891 Personal history of nicotine dependence: Secondary | ICD-10-CM

## 2023-02-22 DIAGNOSIS — Z79899 Other long term (current) drug therapy: Secondary | ICD-10-CM

## 2023-02-22 DIAGNOSIS — I429 Cardiomyopathy, unspecified: Secondary | ICD-10-CM | POA: Diagnosis present

## 2023-02-22 DIAGNOSIS — I200 Unstable angina: Secondary | ICD-10-CM | POA: Diagnosis not present

## 2023-02-22 DIAGNOSIS — I350 Nonrheumatic aortic (valve) stenosis: Secondary | ICD-10-CM | POA: Diagnosis present

## 2023-02-22 DIAGNOSIS — Z8719 Personal history of other diseases of the digestive system: Secondary | ICD-10-CM

## 2023-02-22 DIAGNOSIS — I25110 Atherosclerotic heart disease of native coronary artery with unstable angina pectoris: Secondary | ICD-10-CM | POA: Diagnosis not present

## 2023-02-22 DIAGNOSIS — I130 Hypertensive heart and chronic kidney disease with heart failure and stage 1 through stage 4 chronic kidney disease, or unspecified chronic kidney disease: Secondary | ICD-10-CM | POA: Diagnosis present

## 2023-02-22 DIAGNOSIS — I2510 Atherosclerotic heart disease of native coronary artery without angina pectoris: Secondary | ICD-10-CM | POA: Diagnosis present

## 2023-02-22 DIAGNOSIS — E1151 Type 2 diabetes mellitus with diabetic peripheral angiopathy without gangrene: Secondary | ICD-10-CM | POA: Diagnosis present

## 2023-02-22 DIAGNOSIS — E785 Hyperlipidemia, unspecified: Secondary | ICD-10-CM | POA: Diagnosis present

## 2023-02-22 DIAGNOSIS — Z955 Presence of coronary angioplasty implant and graft: Secondary | ICD-10-CM

## 2023-02-22 DIAGNOSIS — M109 Gout, unspecified: Secondary | ICD-10-CM | POA: Diagnosis present

## 2023-02-22 DIAGNOSIS — Z951 Presence of aortocoronary bypass graft: Secondary | ICD-10-CM

## 2023-02-22 DIAGNOSIS — Z7982 Long term (current) use of aspirin: Secondary | ICD-10-CM

## 2023-02-22 DIAGNOSIS — N1832 Chronic kidney disease, stage 3b: Secondary | ICD-10-CM | POA: Diagnosis present

## 2023-02-22 DIAGNOSIS — Z95828 Presence of other vascular implants and grafts: Secondary | ICD-10-CM

## 2023-02-22 DIAGNOSIS — G894 Chronic pain syndrome: Secondary | ICD-10-CM | POA: Diagnosis present

## 2023-02-22 DIAGNOSIS — Z79891 Long term (current) use of opiate analgesic: Secondary | ICD-10-CM

## 2023-02-22 DIAGNOSIS — Z8249 Family history of ischemic heart disease and other diseases of the circulatory system: Secondary | ICD-10-CM

## 2023-02-22 DIAGNOSIS — N179 Acute kidney failure, unspecified: Secondary | ICD-10-CM | POA: Diagnosis present

## 2023-02-22 DIAGNOSIS — E1122 Type 2 diabetes mellitus with diabetic chronic kidney disease: Secondary | ICD-10-CM | POA: Diagnosis present

## 2023-02-22 DIAGNOSIS — E669 Obesity, unspecified: Secondary | ICD-10-CM | POA: Diagnosis present

## 2023-02-22 DIAGNOSIS — I214 Non-ST elevation (NSTEMI) myocardial infarction: Principal | ICD-10-CM

## 2023-02-22 DIAGNOSIS — Z7985 Long-term (current) use of injectable non-insulin antidiabetic drugs: Secondary | ICD-10-CM

## 2023-02-22 LAB — BASIC METABOLIC PANEL
Anion gap: 7 (ref 5–15)
BUN: 23 mg/dL (ref 8–23)
CO2: 23 mmol/L (ref 22–32)
Calcium: 8.3 mg/dL — ABNORMAL LOW (ref 8.9–10.3)
Chloride: 108 mmol/L (ref 98–111)
Creatinine, Ser: 1.66 mg/dL — ABNORMAL HIGH (ref 0.61–1.24)
GFR, Estimated: 44 mL/min — ABNORMAL LOW (ref >60)
Glucose, Bld: 182 mg/dL — ABNORMAL HIGH (ref 70–99)
Potassium: 4.3 mmol/L (ref 3.5–5.1)
Sodium: 138 mmol/L (ref 135–145)

## 2023-02-22 LAB — CBC
HCT: 36.5 % — ABNORMAL LOW (ref 39.0–52.0)
Hemoglobin: 12.3 g/dL — ABNORMAL LOW (ref 13.0–17.0)
MCH: 30.3 pg (ref 26.0–34.0)
MCHC: 33.7 g/dL (ref 30.0–36.0)
MCV: 89.9 fL (ref 80.0–100.0)
Platelets: 156 10*3/uL (ref 150–400)
RBC: 4.06 MIL/uL — ABNORMAL LOW (ref 4.22–5.81)
RDW: 12.4 % (ref 11.5–15.5)
WBC: 6.2 10*3/uL (ref 4.0–10.5)
nRBC: 0 % (ref 0.0–0.2)

## 2023-02-22 LAB — PROTIME-INR
INR: 1.2 (ref 0.8–1.2)
Prothrombin Time: 15 seconds (ref 11.4–15.2)

## 2023-02-22 LAB — TROPONIN I (HIGH SENSITIVITY)
Troponin I (High Sensitivity): 18 ng/L — ABNORMAL HIGH (ref <18)
Troponin I (High Sensitivity): 81 ng/L — ABNORMAL HIGH (ref <18)

## 2023-02-22 NOTE — ED Triage Notes (Signed)
Pt arrives via [facility] with CC of generalized chest pain x1 hour - pressure like someone sitting on chest. Denies dizziness, vision changes, and SOB.

## 2023-02-22 NOTE — ED Triage Notes (Signed)
First Nurse Note:  Pt BIB [facility] from home. C/o chest pain 1 hr pta with associated SOB. States to EMS that he feels like something is sitting on his chest. Initially HTN 190/79 with EMS with hx of same. 324 ASA given with EMS en route. 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 IV established en route. 3 sprays nitroglycerin given en route w/o relief. 1 inch nitroglycerin paste to chest with some relief. Last bp with EMS 124/72. Pt alert and oriented.   96% RA HR 90 CBG 231

## 2023-02-22 NOTE — ED Provider Notes (Signed)
[facility] Provider Note    Event Date/Time   First MD Initiated Contact with Patient 02/22/23 2259     (approximate)   History   Chest Pain   HPI  [name] [name]. is a 67 y.o. male   Past medical history of CAD/prior MIs, CABG, diabetes, hyperlipidemia and hypertension who presents emergency department with chest discomfort, pressure radiating to both arms starting this evening while at rest.  Got nitro and aspirin by EMS.  Improved but still slightly discomfort in the substernal chest.  No recent illnesses, no exertional symptoms in the last few days.  Has been fully compliant with all of his medications.  He denies any shortness of breath currently.   External Medical Documents Reviewed: Discharge summary from hospitalization in August 2023 for unstable angina      Physical Exam   Triage Vital Signs: ED Triage Vitals  Enc Vitals Group     BP 02/22/23 2044 (!) 167/58     Pulse Rate 02/22/23 2044 82     Resp 02/22/23 2044 17     Temp 02/22/23 2044 98.7 F (37.1 C)     Temp Source 02/22/23 2044 Oral     SpO2 02/22/23 2044 99 %     Weight 02/22/23 2039 165 lb (74.8 kg)     Height 02/22/23 2039 5\' 3"  (1.6 m)     Head Circumference --      Peak Flow --      Pain Score 02/22/23 2039 7     Pain Loc --      Pain Edu? --      Excl. in GC? --     Most recent vital signs: Vitals:   02/22/23 2044 02/23/23 0045  BP: (!) 167/58 (!) 144/66  Pulse: 82 85  Resp: 17 17  Temp: 98.7 F (37.1 C)   SpO2: 99% 100%    General: Awake, no distress.  CV:  Good peripheral perfusion.  Resp:  Normal effort.  Abd:  No distention.  Other:  Surgical scar in the center of his chest, lungs clear, skin warm well-perfused, euvolemic appearing, radial pulses intact equal bilaterally.  Soft nontender abdomen to palpation all quadrants.  Bedside echo shows no significant pericardial effusion.   ED Results / Procedures / Treatments   Labs (all labs ordered  are listed, but only abnormal results are displayed) Labs Reviewed  BASIC METABOLIC PANEL - Abnormal; Notable for the following components:      Result Value   Glucose, Bld 182 (*)    Creatinine, Ser 1.66 (*)    Calcium 8.3 (*)    GFR, Estimated 44 (*)    All other components within normal limits  CBC - Abnormal; Notable for the following components:   RBC 4.06 (*)    Hemoglobin 12.3 (*)    HCT 36.5 (*)    All other components within normal limits  TROPONIN I (HIGH SENSITIVITY) - Abnormal; Notable for the following components:   Troponin I (High Sensitivity) 18 (*)    All other components within normal limits  TROPONIN I (HIGH SENSITIVITY) - Abnormal; Notable for the following components:   Troponin I (High Sensitivity) 81 (*)    All other components within normal limits  PROTIME-INR     I ordered and reviewed the above labs they are notable for troponin bumped from 18-81.  EKG  ED ECG REPORT I, Pilar Jarvis, the attending physician, personally viewed and interpreted this ECG.  Date: 02/22/2023  EKG Time: 2041  Rate: 82  Rhythm: sinus  Axis: lad  Intervals:rbbb  ST&T Change: no stemi    RADIOLOGY I independently reviewed and interpreted chest x-ray and I see no obvious pneumothorax or focality   PROCEDURES:  Critical Care performed: Yes, see critical care procedure note(s)  .Critical Care  Performed by: Pilar Jarvis, MD Authorized by: Pilar Jarvis, MD   Critical care provider statement:    Critical care time (minutes):  30   Critical care was time spent personally by me on the following activities:  Development of treatment plan with patient or surrogate, discussions with consultants, evaluation of patient's response to treatment, examination of patient, ordering and review of laboratory studies, ordering and review of radiographic studies, ordering and performing treatments and interventions, pulse oximetry, re-evaluation of patient's condition and review of old  charts    MEDICATIONS ORDERED IN ED: Medications - No data to display  External physician / consultants:  I spoke with hospitalist for admission and regarding care plan for this patient.   IMPRESSION / MDM / ASSESSMENT AND PLAN / ED COURSE  I reviewed the triage vital signs and the nursing notes.                                Patient's presentation is most consistent with acute presentation with potential threat to life or bodily function.  Differential diagnosis includes, but is not limited to, ACS, PE, dissection, intra-abdominal infection, obstruction, gastritis or dyspepsia   The patient is on the cardiac monitor to evaluate for evidence of arrhythmia and/or significant heart rate changes.  MDM: Patient with extensive cardiac history with unstable angina, chest pressure at rest rating to the arms better with nitro and aspirin with ongoing pressure pain and no STEMI on EKG but troponin bumped from 18-81.  Heparin ordered.  Admission.  Increased cardiac silhouette on chest x-ray by my bedside ultrasound shows no significant pericardial effusion.  He is stable.       FINAL CLINICAL IMPRESSION(S) / ED DIAGNOSES   Final diagnoses:  NSTEMI (non-ST elevated myocardial infarction) (HCC)     Rx / DC Orders   ED Discharge Orders     None        Note:  This document was prepared using Dragon voice recognition software and may include unintentional dictation errors.    Pilar Jarvis, MD 02/23/23 [phone]

## 2023-02-23 DIAGNOSIS — I5032 Chronic diastolic (congestive) heart failure: Secondary | ICD-10-CM | POA: Diagnosis present

## 2023-02-23 DIAGNOSIS — I2510 Atherosclerotic heart disease of native coronary artery without angina pectoris: Secondary | ICD-10-CM | POA: Diagnosis present

## 2023-02-23 DIAGNOSIS — G894 Chronic pain syndrome: Secondary | ICD-10-CM | POA: Diagnosis present

## 2023-02-23 DIAGNOSIS — Z95828 Presence of other vascular implants and grafts: Secondary | ICD-10-CM

## 2023-02-23 DIAGNOSIS — Z7982 Long term (current) use of aspirin: Secondary | ICD-10-CM | POA: Diagnosis not present

## 2023-02-23 DIAGNOSIS — I130 Hypertensive heart and chronic kidney disease with heart failure and stage 1 through stage 4 chronic kidney disease, or unspecified chronic kidney disease: Secondary | ICD-10-CM | POA: Diagnosis present

## 2023-02-23 DIAGNOSIS — Z955 Presence of coronary angioplasty implant and graft: Secondary | ICD-10-CM | POA: Diagnosis not present

## 2023-02-23 DIAGNOSIS — E1122 Type 2 diabetes mellitus with diabetic chronic kidney disease: Secondary | ICD-10-CM | POA: Diagnosis present

## 2023-02-23 DIAGNOSIS — Z79899 Other long term (current) drug therapy: Secondary | ICD-10-CM | POA: Diagnosis not present

## 2023-02-23 DIAGNOSIS — I200 Unstable angina: Secondary | ICD-10-CM | POA: Diagnosis present

## 2023-02-23 DIAGNOSIS — M792 Neuralgia and neuritis, unspecified: Secondary | ICD-10-CM | POA: Diagnosis not present

## 2023-02-23 DIAGNOSIS — I350 Nonrheumatic aortic (valve) stenosis: Secondary | ICD-10-CM | POA: Diagnosis present

## 2023-02-23 DIAGNOSIS — G4733 Obstructive sleep apnea (adult) (pediatric): Secondary | ICD-10-CM | POA: Diagnosis present

## 2023-02-23 DIAGNOSIS — E669 Obesity, unspecified: Secondary | ICD-10-CM | POA: Diagnosis present

## 2023-02-23 DIAGNOSIS — Z951 Presence of aortocoronary bypass graft: Secondary | ICD-10-CM | POA: Diagnosis not present

## 2023-02-23 DIAGNOSIS — E11649 Type 2 diabetes mellitus with hypoglycemia without coma: Secondary | ICD-10-CM | POA: Diagnosis present

## 2023-02-23 DIAGNOSIS — Z7902 Long term (current) use of antithrombotics/antiplatelets: Secondary | ICD-10-CM | POA: Diagnosis not present

## 2023-02-23 DIAGNOSIS — N179 Acute kidney failure, unspecified: Secondary | ICD-10-CM | POA: Diagnosis present

## 2023-02-23 DIAGNOSIS — E785 Hyperlipidemia, unspecified: Secondary | ICD-10-CM | POA: Diagnosis present

## 2023-02-23 DIAGNOSIS — N1832 Chronic kidney disease, stage 3b: Secondary | ICD-10-CM | POA: Diagnosis present

## 2023-02-23 DIAGNOSIS — I429 Cardiomyopathy, unspecified: Secondary | ICD-10-CM | POA: Diagnosis present

## 2023-02-23 DIAGNOSIS — Z8719 Personal history of other diseases of the digestive system: Secondary | ICD-10-CM

## 2023-02-23 DIAGNOSIS — I25110 Atherosclerotic heart disease of native coronary artery with unstable angina pectoris: Secondary | ICD-10-CM | POA: Diagnosis present

## 2023-02-23 DIAGNOSIS — E1151 Type 2 diabetes mellitus with diabetic peripheral angiopathy without gangrene: Secondary | ICD-10-CM | POA: Diagnosis present

## 2023-02-23 DIAGNOSIS — Z8249 Family history of ischemic heart disease and other diseases of the circulatory system: Secondary | ICD-10-CM | POA: Diagnosis not present

## 2023-02-23 DIAGNOSIS — M109 Gout, unspecified: Secondary | ICD-10-CM | POA: Diagnosis present

## 2023-02-23 DIAGNOSIS — Z87891 Personal history of nicotine dependence: Secondary | ICD-10-CM | POA: Diagnosis not present

## 2023-02-23 LAB — HEPARIN LEVEL (UNFRACTIONATED)
Heparin Unfractionated: 0.33 IU/mL (ref 0.30–0.70)
Heparin Unfractionated: 0.29 IU/mL — ABNORMAL LOW (ref 0.30–0.70)
Heparin Unfractionated: 0.38 IU/mL (ref 0.30–0.70)

## 2023-02-23 LAB — PROTIME-INR
INR: 1.1 (ref 0.8–1.2)
Prothrombin Time: 14.9 seconds (ref 11.4–15.2)

## 2023-02-23 LAB — GLUCOSE, CAPILLARY
Glucose-Capillary: 143 mg/dL — ABNORMAL HIGH (ref 70–99)
Glucose-Capillary: 82 mg/dL (ref 70–99)
Glucose-Capillary: 83 mg/dL (ref 70–99)

## 2023-02-23 LAB — CBG MONITORING, ED
Glucose-Capillary: 61 mg/dL — ABNORMAL LOW (ref 70–99)
Glucose-Capillary: 79 mg/dL (ref 70–99)
Glucose-Capillary: 62 mg/dL — ABNORMAL LOW (ref 70–99)
Glucose-Capillary: 101 mg/dL — ABNORMAL HIGH (ref 70–99)
Glucose-Capillary: 128 mg/dL — ABNORMAL HIGH (ref 70–99)

## 2023-02-23 LAB — APTT: aPTT: 34 seconds (ref 24–36)

## 2023-02-23 LAB — HEMOGLOBIN A1C
Hgb A1c MFr Bld: 5.8 % — ABNORMAL HIGH (ref 4.8–5.6)
Mean Plasma Glucose: 119.76 mg/dL

## 2023-02-23 MED ORDER — HEPARIN (PORCINE) 25000 UT/250ML-% IV SOLN
1000.0000 [IU]/h | INTRAVENOUS | Status: DC
  Administered 2023-02-23: 900 [IU]/h via INTRAVENOUS
  Administered 2023-02-23 – 2023-02-24 (×2): 1050 [IU]/h via INTRAVENOUS
  Filled 2023-02-23 (×3): qty 250

## 2023-02-23 MED ORDER — INSULIN GLARGINE-YFGN 100 UNIT/ML ~~LOC~~ SOLN: 50.0000 [IU] | Freq: Two times a day (BID) | SUBCUTANEOUS | Status: DC

## 2023-02-23 MED ORDER — HEPARIN BOLUS VIA INFUSION
1100.0000 [IU] | Freq: Once | INTRAVENOUS | Status: AC
  Administered 2023-02-23: 1100 [IU] via INTRAVENOUS
  Filled 2023-02-23: qty 1100

## 2023-02-23 MED ORDER — NITROGLYCERIN 0.4 MG SL SUBL: 0.4000 mg | SUBLINGUAL_TABLET | SUBLINGUAL | Status: DC | PRN

## 2023-02-23 MED ORDER — ONDANSETRON HCL 4 MG/2ML IJ SOLN: 4.0000 mg | Freq: Four times a day (QID) | INTRAMUSCULAR | Status: DC | PRN

## 2023-02-23 MED ORDER — NITROGLYCERIN 2 % TD OINT
0.5000 [in_us] | TOPICAL_OINTMENT | Freq: Four times a day (QID) | TRANSDERMAL | Status: DC
  Administered 2023-02-23 – 2023-02-25 (×9): 0.5 [in_us] via TOPICAL
  Filled 2023-02-23 (×9): qty 1

## 2023-02-23 MED ORDER — ISOSORBIDE MONONITRATE ER 30 MG PO TB24
30.0000 mg | ORAL_TABLET | ORAL | Status: DC
  Administered 2023-02-23 – 2023-02-25 (×3): 30 mg via ORAL
  Filled 2023-02-23 (×3): qty 1

## 2023-02-23 MED ORDER — SODIUM CHLORIDE 0.9 % IV BOLUS
500.0000 mL | Freq: Once | INTRAVENOUS | Status: AC
  Administered 2023-02-23: 500 mL via INTRAVENOUS

## 2023-02-23 MED ORDER — HYDROCODONE-ACETAMINOPHEN 5-325 MG PO TABS
1.0000 | ORAL_TABLET | Freq: Two times a day (BID) | ORAL | Status: DC | PRN
  Administered 2023-02-23 – 2023-02-25 (×4): 1 via ORAL
  Filled 2023-02-23 (×4): qty 1

## 2023-02-23 MED ORDER — ASPIRIN 81 MG PO TBEC
81.0000 mg | DELAYED_RELEASE_TABLET | Freq: Every day | ORAL | Status: DC
Start: 2023-02-24 — End: 2023-02-23

## 2023-02-23 MED ORDER — GABAPENTIN 300 MG PO CAPS
600.0000 mg | ORAL_CAPSULE | Freq: Two times a day (BID) | ORAL | Status: DC
  Administered 2023-02-23 – 2023-02-25 (×6): 600 mg via ORAL
  Filled 2023-02-23 (×6): qty 2

## 2023-02-23 MED ORDER — METOPROLOL TARTRATE 50 MG PO TABS
50.0000 mg | ORAL_TABLET | Freq: Two times a day (BID) | ORAL | Status: DC
  Administered 2023-02-23 – 2023-02-25 (×5): 50 mg via ORAL
  Filled 2023-02-23 (×5): qty 1

## 2023-02-23 MED ORDER — ALLOPURINOL 100 MG PO TABS
100.0000 mg | ORAL_TABLET | Freq: Every day | ORAL | Status: DC
  Administered 2023-02-23 – 2023-02-25 (×3): 100 mg via ORAL
  Filled 2023-02-23 (×3): qty 1

## 2023-02-23 MED ORDER — DEXTROSE-SODIUM CHLORIDE 5-0.9 % IV SOLN: INTRAVENOUS | Status: DC

## 2023-02-23 MED ORDER — IRBESARTAN 150 MG PO TABS
75.0000 mg | ORAL_TABLET | Freq: Every day | ORAL | Status: DC
  Administered 2023-02-23 – 2023-02-24 (×2): 75 mg via ORAL
  Filled 2023-02-23 (×2): qty 1

## 2023-02-23 MED ORDER — INSULIN ASPART 100 UNIT/ML IJ SOLN
0.0000 [IU] | INTRAMUSCULAR | Status: DC
  Administered 2023-02-23 – 2023-02-24 (×2): 2 [IU] via SUBCUTANEOUS
  Filled 2023-02-23 (×4): qty 1

## 2023-02-23 MED ORDER — LOSARTAN POTASSIUM 50 MG PO TABS
100.0000 mg | ORAL_TABLET | Freq: Every day | ORAL | Status: DC
  Administered 2023-02-23 – 2023-02-24 (×2): 100 mg via ORAL
  Filled 2023-02-23 (×2): qty 2

## 2023-02-23 MED ORDER — ACETAMINOPHEN 325 MG PO TABS: 650.0000 mg | ORAL_TABLET | ORAL | Status: DC | PRN

## 2023-02-23 MED ORDER — HEPARIN BOLUS VIA INFUSION
4000.0000 [IU] | Freq: Once | INTRAVENOUS | Status: AC
  Administered 2023-02-23: 4000 [IU] via INTRAVENOUS
  Filled 2023-02-23: qty 4000

## 2023-02-23 MED ORDER — ASPIRIN 81 MG PO TBEC
81.0000 mg | DELAYED_RELEASE_TABLET | Freq: Every day | ORAL | Status: DC
  Administered 2023-02-23 – 2023-02-25 (×3): 81 mg via ORAL
  Filled 2023-02-23 (×3): qty 1

## 2023-02-23 MED ORDER — PANTOPRAZOLE SODIUM 40 MG PO TBEC
40.0000 mg | DELAYED_RELEASE_TABLET | Freq: Every day | ORAL | Status: DC
  Administered 2023-02-23 – 2023-02-25 (×3): 40 mg via ORAL
  Filled 2023-02-23 (×3): qty 1

## 2023-02-23 MED ORDER — ATORVASTATIN CALCIUM 20 MG PO TABS
40.0000 mg | ORAL_TABLET | Freq: Every day | ORAL | Status: DC
  Administered 2023-02-23 – 2023-02-24 (×2): 40 mg via ORAL
  Filled 2023-02-23 (×2): qty 2

## 2023-02-23 NOTE — Progress Notes (Signed)
Brief cardiology consult   Imp Cp/angina? CAD Obesity  CABG/PCI DM HTN Hyperlipidemia  . Plan ROMI Troponins EKGs  NTG  Heparin 24-48hrs Echo Consider Myoview prior discharge

## 2023-02-23 NOTE — Assessment & Plan Note (Addendum)
Continue basal insulin Sliding scale insulin coverage  Addendum: Hypoglycemia: Patient was found to be hypoglycemic following admission, blood sugar 61.  Basal insulin DC'd.  Started on D5 NS disease n.p.o. for possible procedure

## 2023-02-23 NOTE — ED Notes (Signed)
Pt is asleep with unlabored respirations.

## 2023-02-23 NOTE — H&P (Signed)
History and Physical    Patient: [name] [name]. ZOX:[id number] DOB: 12-02-1955 DOA: 02/22/2023 DOS: the patient was seen and examined on 02/23/2023 PCP: Shane Crutch, PA  Patient coming from: Home  Chief Complaint:  Chief Complaint  Patient presents with   Chest Pain    HPI: [name] [name]. is a 67 y.o. male with medical history significant for diabetes mellitus, , hypertension, OSA on CPAP, diverticular disease,  chronic pain on chronic opiates, mild to moderate aortic valve stenosis, PAD s/p bilateral common iliac artery stents and CAD s/p CABG, status post PCI with stent angioplasty, who had a low risk stress test in August 2023 during an admission for unstable angina, who presents to the ED with substernal chest pain radiating to both arms while seated and at rest.  He denies associated shortness of breath, lower extremity pain or swelling, palpitations, nausea vomiting or diaphoresis.  No recent cough or congestion and denies fever or chills.  He is compliant with his medication. En route, patient received 324 aspirin, 3 sprays of nitroglycerin and 1 inch Nitropaste to chest with relief in pain ED course and data review: BP 167/58 on arrival with  otherwise normal vitals.  Troponin 18-81.  Other labs significant for hemoglobin of 12.3 which is around his baseline and creatinine of 1.66 up from 1.02 about 10 months prior. EKG, personally viewed and interpreted shows NSR at 82 with RBBB and no ischemic ST-T wave changes.  Chest x-ray showed mildly enlarged cardiac silhouette without airspace disease.  Details as follows: IMPRESSION: 1. Mild enlarged cardiac silhouette, increased since prior study. If pericardial effusion is a concern, echocardiography could be performed. 2. No acute airspace disease.  Patient started on a heparin bolus and hospitalist consulted for admission.   Review of Systems: As mentioned in the history of present illness. All other systems reviewed and are  negative.  Past Medical History:  Diagnosis Date   Benign esophageal stricture    Cardiomyopathy, secondary (HCC)    Coronary artery disease    Diabetes mellitus without complication (HCC)    Diverticulitis    GI bleed    Gout    Hemorrhoids    Hyperlipidemia    Hypertension    Sleep apnea    Tubular adenoma of colon    Past Surgical History:  Procedure Laterality Date   cardiac bypass     CARDIAC CATHETERIZATION Left 02/03/2016   Procedure: Left Heart Cath and Coronary Angiography;  Surgeon: Dalia Heading, MD;  Location: [facility] INVASIVE CV LAB;  Service: Cardiovascular;  Laterality: Left;   CARDIAC SURGERY     COLONOSCOPY WITH PROPOFOL N/A 04/29/2015   Procedure: COLONOSCOPY WITH PROPOFOL;  Surgeon: Wallace Cullens, MD;  Location: [facility] ENDOSCOPY;  Service: Gastroenterology;  Laterality: N/A;   COLONOSCOPY WITH PROPOFOL N/A 07/20/2020   Procedure: COLONOSCOPY WITH PROPOFOL;  Surgeon: Toledo, Boykin Nearing, MD;  Location: [facility] ENDOSCOPY;  Service: Gastroenterology;  Laterality: N/A;   CORONARY ANGIOPLASTY WITH STENT PLACEMENT     x2   CORONARY ARTERY BYPASS GRAFT     2013 & 2017   ESOPHAGOGASTRODUODENOSCOPY (EGD) WITH PROPOFOL N/A 04/29/2015   Procedure: ESOPHAGOGASTRODUODENOSCOPY (EGD) WITH PROPOFOL;  Surgeon: Wallace Cullens, MD;  Location: [facility] ENDOSCOPY;  Service: Gastroenterology;  Laterality: N/A;   LOWER EXTREMITY ANGIOGRAPHY Left 11/20/2021   Procedure: Lower Extremity Angiography;  Surgeon: Annice Needy, MD;  Location: [facility] INVASIVE CV LAB;  Service: Cardiovascular;  Laterality: Left;   SEPTOPLASTY  Social History:  reports that he has quit smoking. His smoking use included cigarettes. He has quit using smokeless tobacco. He reports current alcohol use of about 14.0 standard drinks of alcohol per week. He reports that he does not use drugs.  No Known Allergies  Family History  Problem Relation Age of Onset   Heart disease Mother    Hypertension Father     Prior to Admission  medications   Medication Sig Start Date End Date Taking? Authorizing Provider  allopurinol (ZYLOPRIM) 100 MG tablet Take 100 mg by mouth daily.  12/25/18   [provider]  aspirin EC 81 MG tablet Take 81 mg by mouth daily. Swallow whole.    [provider]  atorvastatin (LIPITOR) 40 MG tablet Take 40 mg by mouth at bedtime.     [provider]  B-D UF III MINI PEN NEEDLES 31G X 5 MM MISC 50 mm 2 (two) times daily. 4 mm needles 12/30/18   [provider]  BD PEN NEEDLE NANO 2ND GEN 32G X 4 MM MISC Inject 1 applicator into the skin in the morning and at bedtime. 07/18/21   [provider]  Cholecalciferol 25 MCG (1000 UT) tablet Take 2,000 Units by mouth daily.    [provider]  clopidogrel (PLAVIX) 75 MG tablet Take 1 tablet (75 mg total) by mouth daily. 11/02/22   Georgiana Spinner, NP  gabapentin (NEURONTIN) 600 MG tablet Take 1 tablet (600 mg total) by mouth 2 (two) times daily. 08/03/20 11/12/22  Delano Metz, MD  HYDROcodone-acetaminophen (NORCO/VICODIN) 5-325 MG tablet Take 1 tablet by mouth 2 (two) times daily as needed for severe pain. Must last 30 days 01/20/23 02/19/23  Delano Metz, MD  HYDROcodone-acetaminophen (NORCO/VICODIN) 5-325 MG tablet Take 1 tablet by mouth 2 (two) times daily as needed for severe pain. Must last 30 days 02/19/23 03/21/23  Delano Metz, MD  isosorbide mononitrate (IMDUR) 30 MG 24 hr tablet Take 30 mg by mouth every morning.     [provider]  losartan (COZAAR) 100 MG tablet Take 1 tablet (100 mg total) by mouth daily. 04/28/22 05/28/22  Charise Killian, MD  metoprolol tartrate (LOPRESSOR) 50 MG tablet Take 1 tablet (50 mg total) by mouth 2 (two) times daily. Patient taking differently: Take 50 mg by mouth 3 (three) times daily. 10/09/20   Alford Highland, MD  Multiple Vitamin (MULTIVITAMIN WITH MINERALS) TABS tablet Take 1 tablet by mouth every evening.    [provider]  naloxone  [facility]) nasal spray 4 mg/0.1 mL Place 1 spray into the nose as needed for up to 365 doses (for opioid-induced respiratory depresssion). In case of emergency (overdose), spray once into each nostril. If no response within 3 minutes, repeat application and call 911. 08/13/22 08/13/23  Delano Metz, MD  nitroGLYCERIN (NITROSTAT) 0.4 MG SL tablet Place 0.4 mg under the tongue every 5 (five) minutes x 3 doses as needed for chest pain.  03/07/16   [provider]  OZEMPIC, 1 MG/DOSE, 4 MG/3ML SOPN Inject 1 mg into the skin once a week. 04/02/22   [provider]  pantoprazole (PROTONIX) 40 MG tablet Take 1 tablet (40 mg total) by mouth daily. 10/09/20 04/26/22  Alford Highland, MD  Potassium 99 MG TABS Take 1 tablet by mouth daily.    [provider]  TRESIBA FLEXTOUCH 100 UNIT/ML FlexTouch Pen Inject 50 Units into the skin 2 (two) times daily. 07/04/22   [provider]  valsartan (DIOVAN)  80 MG tablet Take 80 mg by mouth 2 (two) times daily. 05/21/22 05/21/23  [provider]    Physical Exam: Vitals:   02/22/23 2039 02/22/23 2044 02/23/23 0045 02/23/23 0057  BP:  (!) 167/58 (!) 144/66   Pulse:  82 85   Resp:  17 17   Temp:  98.7 F (37.1 C)  98.4 F (36.9 C)  TempSrc:  Oral  Oral  SpO2:  99% 100%   Weight: 74.8 kg     Height: 5\' 3"  (1.6 m)      Physical Exam Vitals and nursing note reviewed.  Constitutional:      General: He is not in acute distress. HENT:     Head: Normocephalic and atraumatic.  Cardiovascular:     Rate and Rhythm: Normal rate and regular rhythm.     Heart sounds: Normal heart sounds.  Pulmonary:     Effort: Pulmonary effort is normal.     Breath sounds: Normal breath sounds.  Abdominal:     Palpations: Abdomen is soft.     Tenderness: There is no abdominal tenderness.  Neurological:     Mental Status: Mental status is at baseline.     Labs on Admission: I have personally reviewed following labs and imaging  studies  CBC: Recent Labs  Lab 02/22/23 2042  WBC 6.2  HGB 12.3*  HCT 36.5*  MCV 89.9  PLT 156   Basic Metabolic Panel: Recent Labs  Lab 02/22/23 2042  NA 138  K 4.3  CL 108  CO2 23  GLUCOSE 182*  BUN 23  CREATININE 1.66*  CALCIUM 8.3*   GFR: Estimated Creatinine Clearance: 38.1 mL/min (A) (by C-G formula based on SCr of 1.66 mg/dL (H)). Liver Function Tests: No results for input(s): "AST", "ALT", "ALKPHOS", "BILITOT", "PROT", "ALBUMIN" in the last 168 hours. No results for input(s): "LIPASE", "AMYLASE" in the last 168 hours. No results for input(s): "AMMONIA" in the last 168 hours. Coagulation Profile: Recent Labs  Lab 02/22/23 2042 02/23/23 0105  INR 1.2 1.1   Cardiac Enzymes: No results for input(s): "CKTOTAL", "CKMB", "CKMBINDEX", "TROPONINI" in the last 168 hours. BNP (last 3 results) No results for input(s): "PROBNP" in the last 8760 hours. HbA1C: No results for input(s): "HGBA1C" in the last 72 hours. CBG: No results for input(s): "GLUCAP" in the last 168 hours. Lipid Profile: No results for input(s): "CHOL", "HDL", "LDLCALC", "TRIG", "CHOLHDL", "LDLDIRECT" in the last 72 hours. Thyroid Function Tests: No results for input(s): "TSH", "T4TOTAL", "FREET4", "T3FREE", "THYROIDAB" in the last 72 hours. Anemia Panel: No results for input(s): "VITAMINB12", "FOLATE", "FERRITIN", "TIBC", "IRON", "RETICCTPCT" in the last 72 hours. Urine analysis:    Component Value Date/Time   COLORURINE YELLOW (A) 05/28/2016 1111   APPEARANCEUR CLEAR (A) 05/28/2016 1111   APPEARANCEUR Clear 05/16/2013 0059   LABSPEC 1.013 05/28/2016 1111   LABSPEC 1.021 05/16/2013 0059   PHURINE 5.0 05/28/2016 1111   GLUCOSEU NEGATIVE 05/28/2016 1111   GLUCOSEU Negative 05/16/2013 0059   HGBUR NEGATIVE 05/28/2016 1111   BILIRUBINUR NEGATIVE 05/28/2016 1111   BILIRUBINUR Negative 05/16/2013 0059   KETONESUR NEGATIVE 05/28/2016 1111   PROTEINUR NEGATIVE 05/28/2016 1111   NITRITE  NEGATIVE 05/28/2016 1111   LEUKOCYTESUR NEGATIVE 05/28/2016 1111   LEUKOCYTESUR Negative 05/16/2013 0059    Radiological Exams on Admission: DG Chest 2 View  Result Date: 02/22/2023 CLINICAL DATA:  Chest pain and shortness of breath for 1 hour EXAM: CHEST - 2 VIEW COMPARISON:  04/25/2022 FINDINGS: Frontal and lateral views  of the chest are obtained on 3 images. Stable postsurgical changes from CABG, with retained epicardial pacing wires again noted. Mild enlargement the cardiac silhouette. No acute airspace disease, effusion, or pneumothorax. No acute bony abnormalities. IMPRESSION: 1. Mild enlarged cardiac silhouette, increased since prior study. If pericardial effusion is a concern, echocardiography could be performed. 2. No acute airspace disease. Electronically Signed   By: Sharlet Salina M.D.   On: 02/22/2023 21:03     Data Reviewed: Relevant notes from primary care and specialist visits, past discharge summaries as available in EHR, including Care Everywhere. Prior diagnostic testing as pertinent to current admission diagnoses Updated medications and problem lists for reconciliation ED course, including vitals, labs, imaging, treatment and response to treatment Triage notes, nursing and pharmacy notes and ED provider's notes Notable results as noted in HPI   Assessment and Plan: * Unstable angina (HCC) History of CABG 2013 with redo 2017 Low risk stress test 04/2022 Patient with chest pain without EKG changes but with troponin bump 18> 81 Continue heparin infusion Continue Nitropaste Continue metoprolol, losartan, isosorbide, atorvastatin and aspirin Cardiology consult Will leave n.p.o. in case of procedure  AKI (acute kidney injury) (HCC) Creatinine 1.66.  Most recent baseline 1.02 Continue to monitor and avoid nephrotoxins Will give a small fluid bolus.  Chronic pain syndrome Long-term prescription opiate use Continue hydrocodone and gabapentin  History of esophageal  stricture s/p dilatation in 2016 Continue Protonix  PAD S/P bilateral common and iliac artery stents Continue aspirin and atorvastatin  DM2 (diabetes mellitus, type 2) (HCC) Continue basal insulin Sliding scale insulin coverage  Addendum: Hypoglycemia: Patient was found to be hypoglycemic following admission, blood sugar 61.  Basal insulin DC'd.  Started on D5 NS disease n.p.o. for possible procedure  OSA (obstructive sleep apnea) CPAP nightly   DVT prophylaxis: Heparin infusion  Consults: Cardiology, Dr. Juliann Pares  Advance Care Planning:   Code Status: Prior   Family Communication: none  Disposition Plan: Back to previous home environment  Severity of Illness: The appropriate patient status for this patient is INPATIENT. Inpatient status is judged to be reasonable and necessary in order to provide the required intensity of service to ensure the patient's safety. The patient's presenting symptoms, physical exam findings, and initial radiographic and laboratory data in the context of their chronic comorbidities is felt to place them at high risk for further clinical deterioration. Furthermore, it is not anticipated that the patient will be medically stable for discharge from the hospital within 2 midnights of admission.   * I certify that at the point of admission it is my clinical judgment that the patient will require inpatient hospital care spanning beyond 2 midnights from the point of admission due to high intensity of service, high risk for further deterioration and high frequency of surveillance required.*  Author: Andris Baumann, MD 02/23/2023 1:29 AM  For on call review [email or web address].

## 2023-02-23 NOTE — Plan of Care (Signed)
  Problem: Metabolic: Goal: Ability to maintain appropriate glucose levels will improve Outcome: Progressing   Problem: Skin Integrity: Goal: Risk for impaired skin integrity will decrease Outcome: Progressing   Problem: Activity: Goal: Risk for activity intolerance will decrease Outcome: Progressing   Problem: Nutrition: Goal: Adequate nutrition will be maintained Outcome: Progressing   

## 2023-02-23 NOTE — Progress Notes (Signed)
ANTICOAGULATION CONSULT NOTE - Initial Consult  Pharmacy Consult for Heparin  Indication: chest pain/ACS  No Known Allergies  Patient Measurements: Height: 5\' 3"  (160 cm) Weight: 74.8 kg (165 lb) IBW/kg (Calculated) : 56.9 Heparin Dosing Weight: 72.2 kg   Vital Signs: Temp: 98.4 F (36.9 C) (06/22 0057) Temp Source: Oral (06/22 0057) BP: 144/66 (06/22 0045) Pulse Rate: 85 (06/22 0045)  Labs: Recent Labs    02/22/23 2042 02/22/23 2224  HGB 12.3*  --   HCT 36.5*  --   PLT 156  --   LABPROT 15.0  --   INR 1.2  --   CREATININE 1.66*  --   TROPONINIHS 18* 81*    Estimated Creatinine Clearance: 38.1 mL/min (A) (by C-G formula based on SCr of 1.66 mg/dL (H)).   Medical History: Past Medical History:  Diagnosis Date   Benign esophageal stricture    Cardiomyopathy, secondary (HCC)    Coronary artery disease    Diabetes mellitus without complication (HCC)    Diverticulitis    GI bleed    Gout    Hemorrhoids    Hyperlipidemia    Hypertension    Sleep apnea    Tubular adenoma of colon     Medications:  (Not in a hospital admission)   Assessment: Pharmacy consulted to dose heparin in this 67 year old male admitted with ACS/NSTEMI.  No prior anticoag noted. CrCl = 38.1 ml/min   Goal of Therapy:  Heparin level 0.3-0.7 units/ml Monitor platelets by anticoagulation protocol: Yes   Plan:  Give 4000 units bolus x 1 Start heparin infusion at 900 units/hr Check anti-Xa level in 6 hours and daily while on heparin Continue to monitor H&H and platelets  Talmadge Ganas D 02/23/2023,1:01 AM

## 2023-02-23 NOTE — Progress Notes (Signed)
ANTICOAGULATION CONSULT NOTE   Pharmacy Consult for Heparin  Indication: chest pain/ACS  No Known Allergies  Patient Measurements: Height: 5\' 3"  (160 cm) Weight: 74.8 kg (165 lb) IBW/kg (Calculated) : 56.9 Heparin Dosing Weight: 72.2 kg   Vital Signs: Temp: 98 F (36.7 C) (06/22 0500) Temp Source: Oral (06/22 0500) BP: 176/85 (06/22 0805) Pulse Rate: 85 (06/22 0805)  Labs: Recent Labs    02/22/23 2042 02/22/23 2224 02/23/23 0105 02/23/23 0802  HGB 12.3*  --   --   --   HCT 36.5*  --   --   --   PLT 156  --   --   --   APTT  --   --  34  --   LABPROT 15.0  --  14.9  --   INR 1.2  --  1.1  --   HEPARINUNFRC  --   --   --  0.33  CREATININE 1.66*  --   --   --   TROPONINIHS 18* 81*  --   --      Estimated Creatinine Clearance: 38.1 mL/min (A) (by C-G formula based on SCr of 1.66 mg/dL (H)).   Medical History: Past Medical History:  Diagnosis Date   Benign esophageal stricture    Cardiomyopathy, secondary (HCC)    Coronary artery disease    Diabetes mellitus without complication (HCC)    Diverticulitis    GI bleed    Gout    Hemorrhoids    Hyperlipidemia    Hypertension    Sleep apnea    Tubular adenoma of colon     Medications:  (Not in a hospital admission)  Assessment: Pharmacy consulted to dose heparin in this 67 year old male admitted with ACS/NSTEMI. Hx CABG 2013/2017. Trop 81.  No prior anticoag noted.  6/22 0802 HL 0.33  Goal of Therapy:  Heparin level 0.3-0.7 units/ml Monitor platelets by anticoagulation protocol: Yes   Plan:  Heparin level is therapeutic. Will continue heparin infusion at 900 units/hr. Recheck heparin level in 6 hours. CBC daily while heparin. Plan for heparin 24-48 hours per cards note.   Ronnald Ramp, PharmD, BCPS 02/23/2023,9:02 AM

## 2023-02-23 NOTE — Assessment & Plan Note (Signed)
Creatinine 1.66.  Most recent baseline 1.02 Continue to monitor and avoid nephrotoxins Will give a small fluid bolus.

## 2023-02-23 NOTE — Progress Notes (Signed)
ANTICOAGULATION CONSULT NOTE   Pharmacy Consult for Heparin  Indication: chest pain/ACS  No Known Allergies  Patient Measurements: Height: 5\' 3"  (160 cm) Weight: 74.8 kg (165 lb) IBW/kg (Calculated) : 56.9 Heparin Dosing Weight: 72.2 kg   Vital Signs: Temp: 98 F (36.7 C) (06/22 0902) Temp Source: Oral (06/22 0902) BP: 136/76 (06/22 1300) Pulse Rate: 81 (06/22 1300)  Labs: Recent Labs    02/22/23 2042 02/22/23 2224 02/23/23 0105 02/23/23 0802 02/23/23 1408  HGB 12.3*  --   --   --   --   HCT 36.5*  --   --   --   --   PLT 156  --   --   --   --   APTT  --   --  34  --   --   LABPROT 15.0  --  14.9  --   --   INR 1.2  --  1.1  --   --   HEPARINUNFRC  --   --   --  0.33 0.29*  CREATININE 1.66*  --   --   --   --   TROPONINIHS 18* 81*  --   --   --      Estimated Creatinine Clearance: 38.1 mL/min (A) (by C-G formula based on SCr of 1.66 mg/dL (H)).   Medical History: Past Medical History:  Diagnosis Date   Benign esophageal stricture    Cardiomyopathy, secondary (HCC)    Coronary artery disease    Diabetes mellitus without complication (HCC)    Diverticulitis    GI bleed    Gout    Hemorrhoids    Hyperlipidemia    Hypertension    Sleep apnea    Tubular adenoma of colon     Medications:  (Not in a hospital admission)  Assessment: Pharmacy consulted to dose heparin in this 67 year old male w/ PMH of DM, HTN, OSA, aortic valve stenosis, PAD s/p bilateral common iliac artery stents and CAD s/p CABG, status post PCI with stent angioplasty admitted with ACS/NSTEMI. Hx CABG 2013/2017. Trop 81.  No prior anticoag noted.  Goal of Therapy:  Heparin level 0.3-0.7 units/ml Monitor platelets by anticoagulation protocol: Yes   Plan:  heparin level is subtherapeutic.  ---bolus heparin 1100 units IV x 1, then increase infusion rate to 1050 units/hr ---Recheck heparin level in 6 hours after rate change ---CBC daily while heparin. Plan for heparin 24-48 hours per  cards note.   Lowella Bandy, PharmD, BCPS 02/23/2023,2:31 PM

## 2023-02-23 NOTE — Assessment & Plan Note (Signed)
Long-term prescription opiate use Continue hydrocodone and gabapentin

## 2023-02-23 NOTE — Progress Notes (Signed)
ANTICOAGULATION CONSULT NOTE   Pharmacy Consult for Heparin  Indication: chest pain/ACS  No Known Allergies  Patient Measurements: Height: 5\' 3"  (160 cm) Weight: 74.8 kg (165 lb) IBW/kg (Calculated) : 56.9 Heparin Dosing Weight: 72.2 kg   Vital Signs: Temp: 98.1 F (36.7 C) (06/22 2006) Temp Source: Oral (06/22 1531) BP: 160/78 (06/22 2006) Pulse Rate: 83 (06/22 2006)  Labs: Recent Labs    02/22/23 2042 02/22/23 2224 02/23/23 0105 02/23/23 0802 02/23/23 1408 02/23/23 2147  HGB 12.3*  --   --   --   --   --   HCT 36.5*  --   --   --   --   --   PLT 156  --   --   --   --   --   APTT  --   --  34  --   --   --   LABPROT 15.0  --  14.9  --   --   --   INR 1.2  --  1.1  --   --   --   HEPARINUNFRC  --   --   --  0.33 0.29* 0.38  CREATININE 1.66*  --   --   --   --   --   TROPONINIHS 18* 81*  --   --   --   --      Estimated Creatinine Clearance: 38.1 mL/min (A) (by C-G formula based on SCr of 1.66 mg/dL (H)).   Medical History: Past Medical History:  Diagnosis Date   Benign esophageal stricture    Cardiomyopathy, secondary (HCC)    Coronary artery disease    Diabetes mellitus without complication (HCC)    Diverticulitis    GI bleed    Gout    Hemorrhoids    Hyperlipidemia    Hypertension    Sleep apnea    Tubular adenoma of colon     Medications:  Medications Prior to Admission  Medication Sig Dispense Refill Last Dose   allopurinol (ZYLOPRIM) 100 MG tablet Take 100 mg by mouth daily.    02/22/2023   aspirin EC 81 MG tablet Take 81 mg by mouth daily. Swallow whole.   02/22/2023   atorvastatin (LIPITOR) 40 MG tablet Take 40 mg by mouth at bedtime.    Past Week   Cholecalciferol 25 MCG (1000 UT) tablet Take 2,000 Units by mouth daily.   02/22/2023   clopidogrel (PLAVIX) 75 MG tablet Take 1 tablet (75 mg total) by mouth daily. 30 tablet 11 02/22/2023   gabapentin (NEURONTIN) 600 MG tablet Take 1 tablet (600 mg total) by mouth 2 (two) times daily. 60 tablet 2  02/22/2023   HYDROcodone-acetaminophen (NORCO/VICODIN) 5-325 MG tablet Take 1 tablet by mouth 2 (two) times daily as needed for severe pain. Must last 30 days 60 tablet 0 02/22/2023   isosorbide mononitrate (IMDUR) 30 MG 24 hr tablet Take 30 mg by mouth every morning.   1 02/22/2023   metoprolol tartrate (LOPRESSOR) 50 MG tablet Take 1 tablet (50 mg total) by mouth 2 (two) times daily. (Patient taking differently: Take 50 mg by mouth 3 (three) times daily.)   02/22/2023   Multiple Vitamin (MULTIVITAMIN WITH MINERALS) TABS tablet Take 1 tablet by mouth every evening.   02/22/2023   naloxone (NARCAN) nasal spray 4 mg/0.1 mL Place 1 spray into the nose as needed for up to 365 doses (for opioid-induced respiratory depresssion). In case of emergency (overdose), spray once into each nostril. If  no response within 3 minutes, repeat application and call 911. 1 each 0 unknown   nitroGLYCERIN (NITROSTAT) 0.4 MG SL tablet Place 0.4 mg under the tongue every 5 (five) minutes x 3 doses as needed for chest pain.    02/22/2023   OZEMPIC, 1 MG/DOSE, 4 MG/3ML SOPN Inject 1 mg into the skin once a week.   02/20/2023   Potassium 99 MG TABS Take 1 tablet by mouth daily.   02/22/2023   TRESIBA FLEXTOUCH 100 UNIT/ML FlexTouch Pen Inject 50-52 Units into the skin 2 (two) times daily. 50 units every morning and 52 units every evening   02/22/2023   valsartan (DIOVAN) 80 MG tablet Take 80 mg by mouth 2 (two) times daily.   02/22/2023   B-D UF III MINI PEN NEEDLES 31G X 5 MM MISC 50 mm 2 (two) times daily. 4 mm needles      BD PEN NEEDLE NANO 2ND GEN 32G X 4 MM MISC Inject 1 applicator into the skin in the morning and at bedtime.      HYDROcodone-acetaminophen (NORCO/VICODIN) 5-325 MG tablet Take 1 tablet by mouth 2 (two) times daily as needed for severe pain. Must last 30 days 60 tablet 0    losartan (COZAAR) 100 MG tablet Take 1 tablet (100 mg total) by mouth daily. 30 tablet 0    pantoprazole (PROTONIX) 40 MG tablet Take 1 tablet (40  mg total) by mouth daily. 30 tablet 0     Assessment: Pharmacy consulted to dose heparin in this 67 year old male w/ PMH of DM, HTN, OSA, aortic valve stenosis, PAD s/p bilateral common iliac artery stents and CAD s/p CABG, status post PCI with stent angioplasty admitted with ACS/NSTEMI. Hx CABG 2013/2017. Trop 81.  No prior anticoag noted.  Goal of Therapy:  Heparin level 0.3-0.7 units/ml Monitor platelets by anticoagulation protocol: Yes   6/22 2147 HL 0.38, therapeutic x 1   Plan:   Heparin level is therapeutic Continue heparin infusion at 1050 units/hr Check heparin level in 6 hours to confirm Daily CBC while on heparin  Barrie Folk, PharmD 02/23/2023,10:29 PM

## 2023-02-23 NOTE — Consult Note (Signed)
CARDIOLOGY CONSULT NOTE               Patient ID: [name] [name]. MRN: [id number] DOB/AGE: 12/14/1955 67 y.o.  Admit date: 02/22/2023 Referring Physician Dr. Lindajo Royal hospitalist Primary Physician Shane Crutch, PA,primary Primary Cardiologist: [facility] Reason for Consultation chest pain angina  HPI: Patient is a 67 year old male history of diabetes hypertension obstructive sleep apnea obesity mild to moderate aortic stenosis peripheral vascular disease with previous intervention coronary bypass surgery PCI and stent in the past had a low risk stress test in August 2023 during his admission to emergency room patient presented this time with recurrent chest pain discomfort and received aspirin and nitro patient had elevated blood pressure negative troponins EKG was nondiagnostic because of the significant history and recent anginal symptoms he was admitted for further evaluation  Review of systems complete and found to be negative unless listed above     Past Medical History:  Diagnosis Date   Benign esophageal stricture    Cardiomyopathy, secondary (HCC)    Coronary artery disease    Diabetes mellitus without complication (HCC)    Diverticulitis    GI bleed    Gout    Hemorrhoids    Hyperlipidemia    Hypertension    Sleep apnea    Tubular adenoma of colon     Past Surgical History:  Procedure Laterality Date   cardiac bypass     CARDIAC CATHETERIZATION Left 02/03/2016   Procedure: Left Heart Cath and Coronary Angiography;  Surgeon: Dalia Heading, MD;  Location: [facility] INVASIVE CV LAB;  Service: Cardiovascular;  Laterality: Left;   CARDIAC SURGERY     COLONOSCOPY WITH PROPOFOL N/A 04/29/2015   Procedure: COLONOSCOPY WITH PROPOFOL;  Surgeon: Wallace Cullens, MD;  Location: [facility] ENDOSCOPY;  Service: Gastroenterology;  Laterality: N/A;   COLONOSCOPY WITH PROPOFOL N/A 07/20/2020   Procedure: COLONOSCOPY WITH PROPOFOL;  Surgeon: Toledo, Boykin Nearing, MD;  Location:  [facility] ENDOSCOPY;  Service: Gastroenterology;  Laterality: N/A;   CORONARY ANGIOPLASTY WITH STENT PLACEMENT     x2   CORONARY ARTERY BYPASS GRAFT     2013 & 2017   ESOPHAGOGASTRODUODENOSCOPY (EGD) WITH PROPOFOL N/A 04/29/2015   Procedure: ESOPHAGOGASTRODUODENOSCOPY (EGD) WITH PROPOFOL;  Surgeon: Wallace Cullens, MD;  Location: [facility] ENDOSCOPY;  Service: Gastroenterology;  Laterality: N/A;   LOWER EXTREMITY ANGIOGRAPHY Left 11/20/2021   Procedure: Lower Extremity Angiography;  Surgeon: Annice Needy, MD;  Location: [facility] INVASIVE CV LAB;  Service: Cardiovascular;  Laterality: Left;   SEPTOPLASTY      Medications Prior to Admission  Medication Sig Dispense Refill Last Dose   allopurinol (ZYLOPRIM) 100 MG tablet Take 100 mg by mouth daily.    02/22/2023   aspirin EC 81 MG tablet Take 81 mg by mouth daily. Swallow whole.   02/22/2023   atorvastatin (LIPITOR) 40 MG tablet Take 40 mg by mouth at bedtime.    Past Week   Cholecalciferol 25 MCG (1000 UT) tablet Take 2,000 Units by mouth daily.   02/22/2023   clopidogrel (PLAVIX) 75 MG tablet Take 1 tablet (75 mg total) by mouth daily. 30 tablet 11 02/22/2023   gabapentin (NEURONTIN) 600 MG tablet Take 1 tablet (600 mg total) by mouth 2 (two) times daily. 60 tablet 2 02/22/2023   HYDROcodone-acetaminophen (NORCO/VICODIN) 5-325 MG tablet Take 1 tablet by mouth 2 (two) times daily as needed for severe pain. Must last 30 days 60 tablet 0 02/22/2023   isosorbide mononitrate (IMDUR) 30 MG  24 hr tablet Take 30 mg by mouth every morning.   1 02/22/2023   metoprolol tartrate (LOPRESSOR) 50 MG tablet Take 1 tablet (50 mg total) by mouth 2 (two) times daily. (Patient taking differently: Take 50 mg by mouth 3 (three) times daily.)   02/22/2023   Multiple Vitamin (MULTIVITAMIN WITH MINERALS) TABS tablet Take 1 tablet by mouth every evening.   02/22/2023   naloxone (NARCAN) nasal spray 4 mg/0.1 mL Place 1 spray into the nose as needed for up to 365 doses (for opioid-induced respiratory  depresssion). In case of emergency (overdose), spray once into each nostril. If no response within 3 minutes, repeat application and call 911. 1 each 0 unknown   nitroGLYCERIN (NITROSTAT) 0.4 MG SL tablet Place 0.4 mg under the tongue every 5 (five) minutes x 3 doses as needed for chest pain.    02/22/2023   OZEMPIC, 1 MG/DOSE, 4 MG/3ML SOPN Inject 1 mg into the skin once a week.   02/20/2023   Potassium 99 MG TABS Take 1 tablet by mouth daily.   02/22/2023   TRESIBA FLEXTOUCH 100 UNIT/ML FlexTouch Pen Inject 50-52 Units into the skin 2 (two) times daily. 50 units every morning and 52 units every evening   02/22/2023   valsartan (DIOVAN) 80 MG tablet Take 80 mg by mouth 2 (two) times daily.   02/22/2023   B-D UF III MINI PEN NEEDLES 31G X 5 MM MISC 50 mm 2 (two) times daily. 4 mm needles      BD PEN NEEDLE NANO 2ND GEN 32G X 4 MM MISC Inject 1 applicator into the skin in the morning and at bedtime.      HYDROcodone-acetaminophen (NORCO/VICODIN) 5-325 MG tablet Take 1 tablet by mouth 2 (two) times daily as needed for severe pain. Must last 30 days 60 tablet 0    losartan (COZAAR) 100 MG tablet Take 1 tablet (100 mg total) by mouth daily. 30 tablet 0    pantoprazole (PROTONIX) 40 MG tablet Take 1 tablet (40 mg total) by mouth daily. 30 tablet 0    Social History   Socioeconomic History   Marital status: Widowed    Spouse name: Not on file   Number of children: Not on file   Years of education: Not on file   Highest education level: Not on file  Occupational History   Occupation: retired  Tobacco Use   Smoking status: Former    Years: 25    Types: Cigarettes   Smokeless tobacco: Former  Substance and Sexual Activity   Alcohol use: Yes    Alcohol/week: 14.0 standard drinks of alcohol    Types: 14 Shots of liquor per week    Comment: couple drinks daily   Drug use: No   Sexual activity: Not on file  Other Topics Concern   Not on file  Social History Narrative   ED visit around the end of  October to have chest pain checked which was benign   Social Determinants of Health   Financial Resource Strain: Not on file  Food Insecurity: No Food Insecurity (02/23/2023)   Hunger Vital Sign    Worried About Running Out of Food in the Last Year: Never true    Ran Out of Food in the Last Year: Never true  Transportation Needs: No Transportation Needs (02/23/2023)   PRAPARE - Administrator, Civil Service (Medical): No    Lack of Transportation (Non-Medical): No  Physical Activity: Not on file  Stress:  Not on file  Social Connections: Not on file  Intimate Partner Violence: Not At Risk (02/23/2023)   Humiliation, Afraid, Rape, and Kick questionnaire    Fear of Current or Ex-Partner: No    Emotionally Abused: No    Physically Abused: No    Sexually Abused: No    Family History  Problem Relation Age of Onset   Heart disease Mother    Hypertension Father       Review of systems complete and found to be negative unless listed above      PHYSICAL EXAM  General: Well developed, well nourished, in no acute distress HEENT:  Normocephalic and atramatic Neck:  No JVD.  Lungs: Clear bilaterally to auscultation and percussion. Heart: HRRR . Normal S1 and S2 without gallops or murmurs.  Abdomen: Bowel sounds are positive, abdomen soft and non-tender  Msk:  Back normal, normal gait. Normal strength and tone for age. Extremities: No clubbing, cyanosis or edema.   Neuro: Alert and oriented X 3. Psych:  Good affect, responds appropriately  Labs:   Lab Results  Component Value Date   WBC 6.2 02/22/2023   HGB 12.3 (L) 02/22/2023   HCT 36.5 (L) 02/22/2023   MCV 89.9 02/22/2023   PLT 156 02/22/2023    Recent Labs  Lab 02/22/23 2042  NA 138  K 4.3  CL 108  CO2 23  BUN 23  CREATININE 1.66*  CALCIUM 8.3*  GLUCOSE 182*   Lab Results  Component Value Date   CKTOTAL 82 05/16/2013   CKMB 1.4 05/16/2013   TROPONINI <0.03 06/16/2017    Lab Results  Component  Value Date   CHOL 82 04/27/2022   CHOL 99 02/29/2016   CHOL 181 04/18/2012   Lab Results  Component Value Date   HDL 26 (L) 04/27/2022   HDL 30 (L) 02/29/2016   HDL 28 (L) 04/18/2012   Lab Results  Component Value Date   LDLCALC 26 04/27/2022   LDLCALC 52 02/29/2016   LDLCALC 107 (H) 04/18/2012   Lab Results  Component Value Date   TRIG 151 (H) 04/27/2022   TRIG 83 02/29/2016   TRIG 230 (H) 04/18/2012   Lab Results  Component Value Date   CHOLHDL 3.2 04/27/2022   CHOLHDL 3.3 02/29/2016   No results found for: "LDLDIRECT"    Radiology: DG Chest 2 View  Result Date: 02/22/2023 CLINICAL DATA:  Chest pain and shortness of breath for 1 hour EXAM: CHEST - 2 VIEW COMPARISON:  04/25/2022 FINDINGS: Frontal and lateral views of the chest are obtained on 3 images. Stable postsurgical changes from CABG, with retained epicardial pacing wires again noted. Mild enlargement the cardiac silhouette. No acute airspace disease, effusion, or pneumothorax. No acute bony abnormalities. IMPRESSION: 1. Mild enlarged cardiac silhouette, increased since prior study. If pericardial effusion is a concern, echocardiography could be performed. 2. No acute airspace disease. Electronically Signed   By: Sharlet Salina M.D.   On: 02/22/2023 21:03    EKG: Normal sinus rhythm right bundle branch block LVH left axis deviation nonspecific ST-T wave changes rate of 80  ASSESSMENT AND PLAN:  Chest pain possibly unstable angina Known coronary artery disease Obesity History of coronary bypass surgery History of PCI and stent Diabetes Hypertension Hyperlipidemia Obstructive sleep apnea . Plan Agree with admission for rule out myocardial infarction Follow-up EKGs troponins on telemetry Nitroglycerin for anginal symptoms Maintain IV heparin for 24 to 48 hours Recommend echocardiogram for assessment of ventricular function as well as valvular  structures Continue diabetes management and control Continue  Lipitor therapy for lipid management Recommend CPAP weight loss exercise portion control for obstructive sleep apnea Blood pressure control with irbesartan Consider ischemia workup with imaging probably a Myoview on Monday   Signed: Alwyn Pea MD 02/23/2023, 6:50 PM

## 2023-02-23 NOTE — ED Notes (Signed)
CBG is 101. 

## 2023-02-23 NOTE — Assessment & Plan Note (Signed)
CPAP nightly

## 2023-02-23 NOTE — ED Notes (Signed)
Provider at bedside

## 2023-02-23 NOTE — ED Notes (Signed)
CBG 62. Dextrose infusion found to be off. Turned back on. Provided orange juice and diet order was ordered. Pt is alert and oriented. Hospitalist informed.

## 2023-02-23 NOTE — Assessment & Plan Note (Signed)
Continue aspirin and atorvastatin. ?

## 2023-02-23 NOTE — ED Notes (Signed)
Provider notified about patients BG. Snack given and rechecked. BG is now [facility] Provider aware

## 2023-02-23 NOTE — Assessment & Plan Note (Addendum)
History of CABG 2013 with redo 2017 Low risk stress test 04/2022 Patient with chest pain without EKG changes but with troponin bump 18> 81 Continue heparin infusion Continue Nitropaste Continue metoprolol, losartan, isosorbide, atorvastatin and aspirin Cardiology consult Will leave n.p.o. in case of procedure

## 2023-02-23 NOTE — Assessment & Plan Note (Signed)
Continue Protonix °

## 2023-02-23 NOTE — ED Notes (Signed)
Dr Juliann Pares evaluated pt at bedside. Pt will not have heart cath today and can have diet ordered. Pt denies SOB and CP except he states that he always has some level of pain to skin where had incision for prior heart surgery. Skin is dry. Pt refuses to use urinal and got up to use toilet.

## 2023-02-23 NOTE — Progress Notes (Signed)
Patient seen in the ER. No family at bedside. Came in with chest discomfort. Currently chest pain free. Seen by cardiology Dr. Juliann Pares. Borderline elevated troponin. Getting IV heparin drip. Continue heparin for 24-48 hrs. Cardiology recommends myoview stress test which probably will be done on Monday  Vitals stable. Continue current cardiac meds

## 2023-02-24 ENCOUNTER — Other Ambulatory Visit: Payer: Self-pay

## 2023-02-24 DIAGNOSIS — I200 Unstable angina: Secondary | ICD-10-CM

## 2023-02-24 LAB — GLUCOSE, CAPILLARY
Glucose-Capillary: 104 mg/dL — ABNORMAL HIGH (ref 70–99)
Glucose-Capillary: 119 mg/dL — ABNORMAL HIGH (ref 70–99)
Glucose-Capillary: 138 mg/dL — ABNORMAL HIGH (ref 70–99)
Glucose-Capillary: 63 mg/dL — ABNORMAL LOW (ref 70–99)
Glucose-Capillary: 78 mg/dL (ref 70–99)
Glucose-Capillary: 95 mg/dL (ref 70–99)

## 2023-02-24 LAB — CBC
HCT: 32.9 % — ABNORMAL LOW (ref 39.0–52.0)
Hemoglobin: 11.3 g/dL — ABNORMAL LOW (ref 13.0–17.0)
MCH: 30.6 pg (ref 26.0–34.0)
MCHC: 34.3 g/dL (ref 30.0–36.0)
MCV: 89.2 fL (ref 80.0–100.0)
Platelets: 127 10*3/uL — ABNORMAL LOW (ref 150–400)
RBC: 3.69 MIL/uL — ABNORMAL LOW (ref 4.22–5.81)
RDW: 12.4 % (ref 11.5–15.5)
WBC: 6.2 10*3/uL (ref 4.0–10.5)
nRBC: 0 % (ref 0.0–0.2)

## 2023-02-24 LAB — HEPARIN LEVEL (UNFRACTIONATED): Heparin Unfractionated: 0.38 IU/mL (ref 0.30–0.70)

## 2023-02-24 MED ORDER — CLOPIDOGREL BISULFATE 75 MG PO TABS
75.0000 mg | ORAL_TABLET | Freq: Every day | ORAL | Status: DC
  Administered 2023-02-24 – 2023-02-25 (×2): 75 mg via ORAL
  Filled 2023-02-24 (×2): qty 1

## 2023-02-24 MED ORDER — IRBESARTAN 150 MG PO TABS
150.0000 mg | ORAL_TABLET | Freq: Every day | ORAL | Status: DC
  Administered 2023-02-25: 150 mg via ORAL
  Filled 2023-02-24: qty 1

## 2023-02-24 NOTE — Progress Notes (Signed)
[facility] Cardiology    SUBJECTIVE: Patient states he feels reasonably well no significant chest pain or shortness of breath still on heparin resting comfortably   Vitals:   02/24/23 0343 02/24/23 0635 02/24/23 0745 02/24/23 1311  BP: 133/73 139/76 127/81 138/78  Pulse: 82 74 76 78  Resp: 16 20 16 16   Temp: 97.7 F (36.5 C)  98.4 F (36.9 C) 97.7 F (36.5 C)  TempSrc:      SpO2: 97%  95% 95%  Weight:      Height:         Intake/Output Summary (Last 24 hours) at 02/24/2023 1717 Last data filed at 02/24/2023 1437 Gross per 24 hour  Intake 1610.12 ml  Output --  Net 1610.12 ml      PHYSICAL EXAM  General: Well developed, well nourished, in no acute distress HEENT:  Normocephalic and atramatic Neck:  No JVD.  Lungs: Clear bilaterally to auscultation and percussion. Heart: HRRR . Normal S1 and S2 without gallops or murmurs.  Abdomen: Bowel sounds are positive, abdomen soft and non-tender  Msk:  Back normal, normal gait. Normal strength and tone for age. Extremities: No clubbing, cyanosis or edema.   Neuro: Alert and oriented X 3. Psych:  Good affect, responds appropriately   LABS: Basic Metabolic Panel: Recent Labs    02/22/23 2042  NA 138  K 4.3  CL 108  CO2 23  GLUCOSE 182*  BUN 23  CREATININE 1.66*  CALCIUM 8.3*   Liver Function Tests: No results for input(s): "AST", "ALT", "ALKPHOS", "BILITOT", "PROT", "ALBUMIN" in the last 72 hours. No results for input(s): "LIPASE", "AMYLASE" in the last 72 hours. CBC: Recent Labs    02/22/23 2042 02/24/23 0406  WBC 6.2 6.2  HGB 12.3* 11.3*  HCT 36.5* 32.9*  MCV 89.9 89.2  PLT 156 127*   Cardiac Enzymes: No results for input(s): "CKTOTAL", "CKMB", "CKMBINDEX", "TROPONINI" in the last 72 hours. BNP: Invalid input(s): "POCBNP" D-Dimer: No results for input(s): "DDIMER" in the last 72 hours. Hemoglobin A1C: Recent Labs    02/23/23 0426  HGBA1C 5.8*   Fasting Lipid Panel: No results for input(s): "CHOL",  "HDL", "LDLCALC", "TRIG", "CHOLHDL", "LDLDIRECT" in the last 72 hours. Thyroid Function Tests: No results for input(s): "TSH", "T4TOTAL", "T3FREE", "THYROIDAB" in the last 72 hours.  Invalid input(s): "FREET3" Anemia Panel: No results for input(s): "VITAMINB12", "FOLATE", "FERRITIN", "TIBC", "IRON", "RETICCTPCT" in the last 72 hours.  DG Chest 2 View  Result Date: 02/22/2023 CLINICAL DATA:  Chest pain and shortness of breath for 1 hour EXAM: CHEST - 2 VIEW COMPARISON:  04/25/2022 FINDINGS: Frontal and lateral views of the chest are obtained on 3 images. Stable postsurgical changes from CABG, with retained epicardial pacing wires again noted. Mild enlargement the cardiac silhouette. No acute airspace disease, effusion, or pneumothorax. No acute bony abnormalities. IMPRESSION: 1. Mild enlarged cardiac silhouette, increased since prior study. If pericardial effusion is a concern, echocardiography could be performed. 2. No acute airspace disease. Electronically Signed   By: Sharlet Salina M.D.   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)   Chronic pain syndrome   Uncontrolled type 2 diabetes mellitus with hypoglycemia, with long-term current  use of insulin (HCC)   PAD S/P bilateral common and iliac artery stents   History of esophageal stricture s/p dilatation in 2016   AKI (acute kidney injury) (HCC)    Plan Chest pain possibly unstable angina known coronary disease recommend Myoview in the morning for ischemia workup Obstructive sleep apnea recommend CPAP weight loss Continue diabetes management and control Peripheral vascular disease by history continue current management statin aspirin Plavix Acute renal  insufficiency recommend adequate hydration Neck pain outpatient follow-up with pain clinic Consider echocardiogram for assessment left ventricular function wall motion    Alwyn Pea, MD 02/24/2023 5:17 PM

## 2023-02-24 NOTE — Progress Notes (Addendum)
  Progress Note   Patient: [name] [name]. ZOX:[id number] DOB: 12/11/1955 DOA: 02/22/2023     1 DOS: the patient was seen and examined on 02/24/2023 at 9:37AM      Brief hospital course: Mr. Maye Hides is an 67 year old M with DM, HTN, OSA on CPAP, chronic pain on daily opiates, PVD status post iliac stents, and CAD status post CABG and PCI who presented for substernal chest pain radiated to both arms while seated and at rest.  Cardiology was consulted and recommended heparin and Myoview on Monday.     Assessment and Plan: * Unstable angina (HCC) History of CABG 2013 with redo 2017 Low risk stress test 04/2022 -Continue metoprolol, irbesartan, Lipitor, aspirin -Stop losartan (this is a duplicate, error) -Resume home Imdur - Continue heparin - Consult cardiology, appreciate recommendations - Plan for Myoview tomorrow - Resume home Plavix   CKD stage IIIb AKI ruled out.  Baseline creatinines appear to be in the 1.4-1.6 range.  Stable here.    Chronic pain syndrome Long-term prescription opiate use -Continue home hydrocodone and gabapentin  History of esophageal stricture s/p dilatation in 2016 -Continue PPI  PAD S/P bilateral common and iliac artery stents -Continue aspirin and atorvastatin  Uncontrolled type 2 diabetes mellitus with hypoglycemia, with long-term current use of insulin (HCC) Glucose controlled off home Ozempic and Tresiba, had hypoglycemia on admission. - Continue sliding scale corrections   OSA (obstructive sleep apnea) -CPAP nightly  Gout - Continue allopurinol       Subjective: Patient feels well.  He had no further chest pain.  He is comfortable lying in bed.  He has questions about his atorvastatin.  No dyspnea or palpitations.     Physical Exam: BP 138/78 (BP Location: Left Arm)   Pulse 78   Temp 97.7 F (36.5 C)   Resp 16   Ht 5\' 3"  (1.6 m)   Wt 74.8 kg   SpO2 95%   BMI 29.23 kg/m   Elderly adult male, lying in bed, no acute  distress RRR, no murmurs, no peripheral edema Respiratory normal, lungs clear without rales or wheezes Abdomen soft without tenderness palpation or guarding, no ascites or distention   Data Reviewed: CBC shows mild anemia Creatinine is 1.6      Disposition: Status is: Inpatient The patient was admitted for acute coronary syndrome  Cardiology recommended heparin and plan to do Myoview tomorrow, likely home afterwards        Author: Alberteen Sam, MD 02/24/2023 1:22 PM  For on call review [email or web address].

## 2023-02-24 NOTE — Progress Notes (Signed)
ANTICOAGULATION CONSULT NOTE   Pharmacy Consult for Heparin  Indication: chest pain/ACS  No Known Allergies  Patient Measurements: Height: 5\' 3"  (160 cm) Weight: 74.8 kg (165 lb) IBW/kg (Calculated) : 56.9 Heparin Dosing Weight: 72.2 kg   Vital Signs: Temp: 97.7 F (36.5 C) (06/23 0343) BP: 133/73 (06/23 0343) Pulse Rate: 82 (06/23 0343)  Labs: Recent Labs    02/22/23 2042 02/22/23 2224 02/23/23 0105 02/23/23 0802 02/23/23 1408 02/23/23 2147 02/24/23 0406  HGB 12.3*  --   --   --   --   --  11.3*  HCT 36.5*  --   --   --   --   --  32.9*  PLT 156  --   --   --   --   --  127*  APTT  --   --  34  --   --   --   --   LABPROT 15.0  --  14.9  --   --   --   --   INR 1.2  --  1.1  --   --   --   --   HEPARINUNFRC  --   --   --    < > 0.29* 0.38 0.38  CREATININE 1.66*  --   --   --   --   --   --   TROPONINIHS 18* 81*  --   --   --   --   --    < > = values in this interval not displayed.     Estimated Creatinine Clearance: 38.1 mL/min (A) (by C-G formula based on SCr of 1.66 mg/dL (H)).   Medical History: Past Medical History:  Diagnosis Date   Benign esophageal stricture    Cardiomyopathy, secondary (HCC)    Coronary artery disease    Diabetes mellitus without complication (HCC)    Diverticulitis    GI bleed    Gout    Hemorrhoids    Hyperlipidemia    Hypertension    Sleep apnea    Tubular adenoma of colon     Medications:  Medications Prior to Admission  Medication Sig Dispense Refill Last Dose   allopurinol (ZYLOPRIM) 100 MG tablet Take 100 mg by mouth daily.    02/22/2023   aspirin EC 81 MG tablet Take 81 mg by mouth daily. Swallow whole.   02/22/2023   atorvastatin (LIPITOR) 40 MG tablet Take 40 mg by mouth at bedtime.    Past Week   Cholecalciferol 25 MCG (1000 UT) tablet Take 2,000 Units by mouth daily.   02/22/2023   clopidogrel (PLAVIX) 75 MG tablet Take 1 tablet (75 mg total) by mouth daily. 30 tablet 11 02/22/2023   gabapentin (NEURONTIN) 600 MG  tablet Take 1 tablet (600 mg total) by mouth 2 (two) times daily. 60 tablet 2 02/22/2023   HYDROcodone-acetaminophen (NORCO/VICODIN) 5-325 MG tablet Take 1 tablet by mouth 2 (two) times daily as needed for severe pain. Must last 30 days 60 tablet 0 02/22/2023   isosorbide mononitrate (IMDUR) 30 MG 24 hr tablet Take 30 mg by mouth every morning.   1 02/22/2023   metoprolol tartrate (LOPRESSOR) 50 MG tablet Take 1 tablet (50 mg total) by mouth 2 (two) times daily. (Patient taking differently: Take 50 mg by mouth 3 (three) times daily.)   02/22/2023   Multiple Vitamin (MULTIVITAMIN WITH MINERALS) TABS tablet Take 1 tablet by mouth every evening.   02/22/2023   naloxone (NARCAN) nasal spray 4  mg/0.1 mL Place 1 spray into the nose as needed for up to 365 doses (for opioid-induced respiratory depresssion). In case of emergency (overdose), spray once into each nostril. If no response within 3 minutes, repeat application and call 911. 1 each 0 unknown   nitroGLYCERIN (NITROSTAT) 0.4 MG SL tablet Place 0.4 mg under the tongue every 5 (five) minutes x 3 doses as needed for chest pain.    02/22/2023   OZEMPIC, 1 MG/DOSE, 4 MG/3ML SOPN Inject 1 mg into the skin once a week.   02/20/2023   Potassium 99 MG TABS Take 1 tablet by mouth daily.   02/22/2023   TRESIBA FLEXTOUCH 100 UNIT/ML FlexTouch Pen Inject 50-52 Units into the skin 2 (two) times daily. 50 units every morning and 52 units every evening   02/22/2023   valsartan (DIOVAN) 80 MG tablet Take 80 mg by mouth 2 (two) times daily.   02/22/2023   B-D UF III MINI PEN NEEDLES 31G X 5 MM MISC 50 mm 2 (two) times daily. 4 mm needles      BD PEN NEEDLE NANO 2ND GEN 32G X 4 MM MISC Inject 1 applicator into the skin in the morning and at bedtime.      HYDROcodone-acetaminophen (NORCO/VICODIN) 5-325 MG tablet Take 1 tablet by mouth 2 (two) times daily as needed for severe pain. Must last 30 days 60 tablet 0    losartan (COZAAR) 100 MG tablet Take 1 tablet (100 mg total) by mouth  daily. 30 tablet 0    pantoprazole (PROTONIX) 40 MG tablet Take 1 tablet (40 mg total) by mouth daily. 30 tablet 0     Assessment: Pharmacy consulted to dose heparin in this 67 year old male w/ PMH of DM, HTN, OSA, aortic valve stenosis, PAD s/p bilateral common iliac artery stents and CAD s/p CABG, status post PCI with stent angioplasty admitted with ACS/NSTEMI. Hx CABG 2013/2017. Trop 81.  No prior anticoag noted.  Goal of Therapy:  Heparin level 0.3-0.7 units/ml Monitor platelets by anticoagulation protocol: Yes   6/22 2147 HL 0.38, therapeutic x 1 6/23 0406 HL 0.38, therapeutic x 2  Plan:   Heparin level is therapeutic Continue heparin infusion at 1050 units/hr Check daily heparin levels while therapeutic Daily CBC while on heparin  Barrie Folk, PharmD 02/24/2023,5:33 AM

## 2023-02-25 ENCOUNTER — Inpatient Hospital Stay: Payer: Medicare Other

## 2023-02-25 ENCOUNTER — Inpatient Hospital Stay
Admit: 2023-02-25 | Discharge: 2023-02-25 | Disposition: A | Discharge status: Home / Self Care | Payer: Medicare Other | Attending: Cardiology | Admitting: Cardiology

## 2023-02-25 DIAGNOSIS — Z794 Long term (current) use of insulin: Secondary | ICD-10-CM

## 2023-02-25 DIAGNOSIS — G894 Chronic pain syndrome: Secondary | ICD-10-CM

## 2023-02-25 DIAGNOSIS — N179 Acute kidney failure, unspecified: Secondary | ICD-10-CM | POA: Diagnosis not present

## 2023-02-25 DIAGNOSIS — I200 Unstable angina: Secondary | ICD-10-CM | POA: Diagnosis not present

## 2023-02-25 DIAGNOSIS — M792 Neuralgia and neuritis, unspecified: Secondary | ICD-10-CM

## 2023-02-25 DIAGNOSIS — E11649 Type 2 diabetes mellitus with hypoglycemia without coma: Secondary | ICD-10-CM

## 2023-02-25 DIAGNOSIS — Z8719 Personal history of other diseases of the digestive system: Secondary | ICD-10-CM

## 2023-02-25 LAB — NM MYOCAR MULTI W/SPECT W/WALL MOTION / EF
Base ST Depression (mm): 0 mm
LV dias vol: 98 mL (ref 62–150)
LV sys vol: 44 mL
Nuc Stress EF: 55 %
Rest Nuclear Isotope Dose: 10.1 mCi
SDS: 1
SRS: 3
SSS: 0
ST Depression (mm): 0 mm
Stress Nuclear Isotope Dose: 30.1 mCi
TID: 1.22

## 2023-02-25 LAB — HEPARIN LEVEL (UNFRACTIONATED): Heparin Unfractionated: 0.76 IU/mL — ABNORMAL HIGH (ref 0.30–0.70)

## 2023-02-25 LAB — CBC
HCT: 34 % — ABNORMAL LOW (ref 39.0–52.0)
Hemoglobin: 11.6 g/dL — ABNORMAL LOW (ref 13.0–17.0)
MCH: 30.2 pg (ref 26.0–34.0)
MCHC: 34.1 g/dL (ref 30.0–36.0)
MCV: 88.5 fL (ref 80.0–100.0)
Platelets: 131 10*3/uL — ABNORMAL LOW (ref 150–400)
RBC: 3.84 MIL/uL — ABNORMAL LOW (ref 4.22–5.81)
RDW: 12.4 % (ref 11.5–15.5)
WBC: 6.1 10*3/uL (ref 4.0–10.5)
nRBC: 0 % (ref 0.0–0.2)

## 2023-02-25 LAB — BASIC METABOLIC PANEL
Anion gap: 8 (ref 5–15)
BUN: 18 mg/dL (ref 8–23)
CO2: 23 mmol/L (ref 22–32)
Calcium: 8.8 mg/dL — ABNORMAL LOW (ref 8.9–10.3)
Chloride: 108 mmol/L (ref 98–111)
Creatinine, Ser: 1.02 mg/dL (ref 0.61–1.24)
GFR, Estimated: >60 mL/min (ref >60)
Glucose, Bld: 84 mg/dL (ref 70–99)
Potassium: 3.9 mmol/L (ref 3.5–5.1)
Sodium: 139 mmol/L (ref 135–145)

## 2023-02-25 LAB — GLUCOSE, CAPILLARY
Glucose-Capillary: 82 mg/dL (ref 70–99)
Glucose-Capillary: 86 mg/dL (ref 70–99)
Glucose-Capillary: 91 mg/dL (ref 70–99)
Glucose-Capillary: 92 mg/dL (ref 70–99)

## 2023-02-25 LAB — ECHOCARDIOGRAM COMPLETE: Weight: 2640.02 oz

## 2023-02-25 MED ORDER — TECHNETIUM TC 99M TETROFOSMIN IV KIT
10.0800 | PACK | Freq: Once | INTRAVENOUS | Status: AC | PRN
  Administered 2023-02-25: 10.08 via INTRAVENOUS

## 2023-02-25 MED ORDER — DEXTROSE-SODIUM CHLORIDE 5-0.9 % IV SOLN: INTRAVENOUS | Status: DC

## 2023-02-25 MED ORDER — GABAPENTIN 600 MG PO TABS: 600.0000 mg | ORAL_TABLET | Freq: Two times a day (BID) | ORAL | 2 refills | Status: AC

## 2023-02-25 MED ORDER — ISOSORBIDE MONONITRATE ER 60 MG PO TB24: 60.0000 mg | ORAL_TABLET | ORAL | 2 refills | Status: AC

## 2023-02-25 MED ORDER — TECHNETIUM TC 99M TETROFOSMIN IV KIT
30.0700 | PACK | Freq: Once | INTRAVENOUS | Status: AC | PRN
  Administered 2023-02-25: 30.07 via INTRAVENOUS

## 2023-02-25 MED ORDER — REGADENOSON 0.4 MG/5ML IV SOLN
0.4000 mg | Freq: Once | INTRAVENOUS | Status: AC
  Administered 2023-02-25: 0.4 mg via INTRAVENOUS

## 2023-02-25 MED ORDER — ACETAMINOPHEN 325 MG PO TABS: 650.0000 mg | ORAL_TABLET | ORAL | Status: DC | PRN

## 2023-02-25 MED ORDER — IRBESARTAN 150 MG PO TABS: 150.0000 mg | ORAL_TABLET | Freq: Every day | ORAL | 2 refills | Status: AC

## 2023-02-25 MED ORDER — ISOSORBIDE MONONITRATE ER 60 MG PO TB24: 60.0000 mg | ORAL_TABLET | ORAL | Status: DC

## 2023-02-25 NOTE — TOC CM/SW Note (Signed)
Transition of Care [facility]) - Inpatient Brief Assessment   Patient Details  Name: [name] [name]. MRN: [id number] Date of Birth: [birth year]  [facility] of [facility]) CM/SW Contact:    Margarito Liner, LCSW Phone Number: 02/25/2023, 10:38 AM   Clinical Narrative: CSW reviewed chart. No TOC needs identified at this time. CSW will continue to follow progress. Please place [facility] consult if any needs arise.  Transition of Care Asessment: Insurance and Status: Insurance coverage has been reviewed Patient has primary care physician: Yes Home environment has been reviewed: Single family home Prior level of function:: Not documented Prior/Current Home Services: No current home services Social Determinants of Health Reivew: SDOH reviewed no interventions necessary Readmission risk has been reviewed: Yes Transition of care needs: no transition of care needs at this time

## 2023-02-25 NOTE — Progress Notes (Signed)
ANTICOAGULATION CONSULT NOTE   Pharmacy Consult for Heparin  Indication: chest pain/ACS  No Known Allergies  Patient Measurements: Height: 5\' 3"  (160 cm) Weight: 74.8 kg (165 lb) IBW/kg (Calculated) : 56.9 Heparin Dosing Weight: 72.2 kg   Vital Signs: Temp: 98.1 F (36.7 C) (06/24 0312) Temp Source: Oral (06/24 0312) BP: 144/80 (06/24 0312) Pulse Rate: 74 (06/24 0312)  Labs: Recent Labs    02/22/23 2042 02/22/23 2224 02/23/23 0105 02/23/23 0802 02/23/23 2147 02/24/23 0406 02/25/23 0337  HGB 12.3*  --   --   --   --  11.3* 11.6*  HCT 36.5*  --   --   --   --  32.9* 34.0*  PLT 156  --   --   --   --  127* 131*  APTT  --   --  34  --   --   --   --   LABPROT 15.0  --  14.9  --   --   --   --   INR 1.2  --  1.1  --   --   --   --   HEPARINUNFRC  --   --   --    < > 0.38 0.38 0.76*  CREATININE 1.66*  --   --   --   --   --   --   TROPONINIHS 18* 81*  --   --   --   --   --    < > = values in this interval not displayed.     Estimated Creatinine Clearance: 38.1 mL/min (A) (by C-G formula based on SCr of 1.66 mg/dL (H)).   Medical History: Past Medical History:  Diagnosis Date   Benign esophageal stricture    Cardiomyopathy, secondary (HCC)    Coronary artery disease    Diabetes mellitus without complication (HCC)    Diverticulitis    GI bleed    Gout    Hemorrhoids    Hyperlipidemia    Hypertension    Sleep apnea    Tubular adenoma of colon     Medications:  Medications Prior to Admission  Medication Sig Dispense Refill Last Dose   allopurinol (ZYLOPRIM) 100 MG tablet Take 100 mg by mouth daily.    02/22/2023   aspirin EC 81 MG tablet Take 81 mg by mouth daily. Swallow whole.   02/22/2023   atorvastatin (LIPITOR) 40 MG tablet Take 40 mg by mouth at bedtime.    Past Week   Cholecalciferol 25 MCG (1000 UT) tablet Take 2,000 Units by mouth daily.   02/22/2023   clopidogrel (PLAVIX) 75 MG tablet Take 1 tablet (75 mg total) by mouth daily. 30 tablet 11  02/22/2023   gabapentin (NEURONTIN) 600 MG tablet Take 1 tablet (600 mg total) by mouth 2 (two) times daily. 60 tablet 2 02/22/2023   HYDROcodone-acetaminophen (NORCO/VICODIN) 5-325 MG tablet Take 1 tablet by mouth 2 (two) times daily as needed for severe pain. Must last 30 days 60 tablet 0 02/22/2023   isosorbide mononitrate (IMDUR) 30 MG 24 hr tablet Take 30 mg by mouth every morning.   1 02/22/2023   metoprolol tartrate (LOPRESSOR) 50 MG tablet Take 1 tablet (50 mg total) by mouth 2 (two) times daily. (Patient taking differently: Take 50 mg by mouth 3 (three) times daily.)   02/22/2023   Multiple Vitamin (MULTIVITAMIN WITH MINERALS) TABS tablet Take 1 tablet by mouth every evening.   02/22/2023   naloxone (NARCAN) nasal spray 4 mg/0.1  mL Place 1 spray into the nose as needed for up to 365 doses (for opioid-induced respiratory depresssion). In case of emergency (overdose), spray once into each nostril. If no response within 3 minutes, repeat application and call 911. 1 each 0 unknown   nitroGLYCERIN (NITROSTAT) 0.4 MG SL tablet Place 0.4 mg under the tongue every 5 (five) minutes x 3 doses as needed for chest pain.    02/22/2023   OZEMPIC, 1 MG/DOSE, 4 MG/3ML SOPN Inject 1 mg into the skin once a week.   02/20/2023   Potassium 99 MG TABS Take 1 tablet by mouth daily.   02/22/2023   TRESIBA FLEXTOUCH 100 UNIT/ML FlexTouch Pen Inject 50-52 Units into the skin 2 (two) times daily. 50 units every morning and 52 units every evening   02/22/2023   valsartan (DIOVAN) 80 MG tablet Take 80 mg by mouth 2 (two) times daily.   02/22/2023   B-D UF III MINI PEN NEEDLES 31G X 5 MM MISC 50 mm 2 (two) times daily. 4 mm needles      BD PEN NEEDLE NANO 2ND GEN 32G X 4 MM MISC Inject 1 applicator into the skin in the morning and at bedtime.      HYDROcodone-acetaminophen (NORCO/VICODIN) 5-325 MG tablet Take 1 tablet by mouth 2 (two) times daily as needed for severe pain. Must last 30 days 60 tablet 0    losartan (COZAAR) 100 MG  tablet Take 1 tablet (100 mg total) by mouth daily. 30 tablet 0    pantoprazole (PROTONIX) 40 MG tablet Take 1 tablet (40 mg total) by mouth daily. 30 tablet 0     Assessment: Pharmacy consulted to dose heparin in this 67 year old male w/ PMH of DM, HTN, OSA, aortic valve stenosis, PAD s/p bilateral common iliac artery stents and CAD s/p CABG, status post PCI with stent angioplasty admitted with ACS/NSTEMI. Hx CABG 2013/2017. Trop 81.  No prior anticoag noted.  Goal of Therapy:  Heparin level 0.3-0.7 units/ml Monitor platelets by anticoagulation protocol: Yes   6/22 2147 HL 0.38, therapeutic x 1 6/23 0406 HL 0.38, therapeutic x 2 6/24 0337 HL 0.76, supratherapeutic @ 1050 units/hr  Plan:   Heparin level is supratherapeutic Decrease heparin infusion rate to 1000 units/hr Check heparin level 6 hours after rate change Daily CBC while on heparin  Barrie Folk, PharmD 02/25/2023,5:09 AM

## 2023-02-25 NOTE — Progress Notes (Signed)
Hypoglycemic Event  CBG: 63  Treatment: 4 oz juice/soda  Symptoms: None  Follow-up CBG: Time:0004 CBG Result: 82  Possible Reasons for Event: Unknown  Comments/MD notified:Hazel Para March, MD Restarted d5NS @50 /hr   Franchot Heidelberg

## 2023-02-25 NOTE — Discharge Summary (Signed)
Physician Discharge Summary   Patient: [name] [name]. 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  [name] [name] is an 67 year old M with DM, HTN, OSA on CPAP, chronic pain on daily opiates, PVD status post iliac stents, and CAD status post CABG and PCI who presented for substernal chest pain radiated to both arms while seated and at rest.   6/24: Patient was admitted for concern of unstable angina, cardiology was consulted and he was placed on heparin infusion for 48 hours.  Labs stable. myoview done today- did show persistent inferior and lateral defect but without new reversible ischemia. Inferior scar was present on last study in August 2023. No further invasive diagnostics recommended and he can be discharged from a cardiac perspective. He will follow-up with cardiology in 1 to 2 weeks for further recommendations.  His chest pain resolved.  Cardiology increased the dose of Imdur, switch to irbesartan.  Patient did had some hypoglycemia on admission.  Need to discuss his diabetes medication with primary care provider for further management.  Patient was advised to start taking medications according to his new  prescriptions and follow-up closely with his cardiologist for further recommendations.    Assessment and Plan: * Unstable angina (HCC) History of CABG 2013 with redo 2017 Low risk stress test 04/2022 Myoview today with some ischemia, no acute changes and cardiology will follow-up as outpatient.  AKI (acute kidney injury) (HCC) Creatinine 1.66.  Most recent baseline 1.02 Continue to monitor and avoid nephrotoxins Will give a small fluid bolus.  Chronic pain syndrome Long-term prescription opiate use Continue hydrocodone and gabapentin  History of esophageal stricture s/p dilatation in 2016 Continue Protonix  PAD S/P bilateral common and iliac artery stents Continue aspirin and atorvastatin  Uncontrolled type 2 diabetes mellitus with hypoglycemia, with long-term current use of insulin (HCC) Continue basal insulin Sliding scale insulin coverage  Addendum: Hypoglycemia: Patient was found to be hypoglycemic following admission, blood sugar 61.  Basal insulin DC'd.  Started on D5 NS disease n.p.o. for possible procedure  OSA (obstructive sleep apnea) CPAP nightly   Pain control - [place] Controlled Substance Reporting System database was reviewed. and patient was instructed, not to drive, operate heavy machinery, perform activities at heights, swimming or participation in water activities or provide baby-sitting services while on Pain, Sleep and Anxiety Medications; until their outpatient Physician has advised to do so again. Also recommended to not to take more than prescribed Pain, Sleep and Anxiety Medications.  Consultants: Cardiology Procedures performed: Myoview Disposition: Home Diet recommendation:  Discharge Diet Orders (From admission, onward)     Start     Ordered   02/25/23 0000  Diet - low sodium  heart healthy        02/25/23 1436           Cardiac and Carb modified diet DISCHARGE MEDICATION: Allergies as of 02/25/2023   No Known Allergies       Medication List     STOP taking these medications    losartan 100 MG tablet Commonly known as: COZAAR   valsartan 80 MG tablet Commonly known as: DIOVAN       TAKE these medications    allopurinol 100 MG tablet Commonly known as: ZYLOPRIM Take 100 mg by mouth daily.   aspirin EC 81 MG tablet Take 81 mg by mouth daily. Swallow whole.   atorvastatin 40 MG tablet Commonly known as: LIPITOR Take 40 mg by mouth at bedtime.   BD Pen Needle Nano 2nd Gen 32G X 4 MM Misc Generic drug: Insulin Pen Needle Inject 1 applicator into the skin in the morning and at bedtime. What changed: Another medication with the same name was removed. Continue taking this medication, and follow the directions you see here.   Cholecalciferol 25 MCG (1000 UT) tablet Take 2,000 Units by mouth daily.   clopidogrel 75 MG tablet Commonly known as: Plavix Take 1 tablet (75 mg total) by mouth daily.   gabapentin 600 MG tablet Commonly known as: NEURONTIN Take 1 tablet (600 mg total) by mouth 2 (two) times daily.   HYDROcodone-acetaminophen 5-325 MG tablet Commonly known as: NORCO/VICODIN Take 1 tablet by mouth 2 (two) times daily as needed for severe pain. Must last 30 days What changed: Another medication with the same name was removed. Continue taking this medication, and follow the directions you see here.   irbesartan 150 MG tablet Commonly known as: AVAPRO Take 1 tablet (150 mg total) by mouth daily. Start taking on: February 26, 2023   isosorbide mononitrate 60 MG 24 hr tablet Commonly known as: IMDUR Take 1 tablet (60 mg total) by mouth every morning. Start taking on: February 26, 2023 What changed:  medication strength how much to take   metoprolol tartrate 50 MG tablet Commonly known as: LOPRESSOR Take 1 tablet (50 mg total) by mouth 2 (two) times daily. What changed: when to take this   multivitamin with minerals Tabs tablet Take 1 tablet by mouth every evening.   naloxone 4  MG/0.1ML Liqd nasal spray kit Commonly known as: NARCAN Place 1 spray into the nose as needed for up to 365 doses (for opioid-induced respiratory depresssion). In case of emergency (overdose), spray once into each nostril. If no response within 3 minutes, repeat application and call 911.   nitroGLYCERIN 0.4 MG SL tablet Commonly known as: NITROSTAT Place 0.4 mg under the tongue every 5 (five) minutes x 3 doses as needed for chest pain.   Ozempic (1 MG/DOSE) 4 MG/3ML Sopn Generic drug: Semaglutide (1 MG/DOSE) Inject 1 mg into the skin once a week.   pantoprazole 40 MG tablet Commonly known as: Protonix Take 1 tablet (40 mg total) by mouth daily.   Potassium 99 MG Tabs Take 1 tablet by mouth daily.   Evaristo Bury FlexTouch 100 UNIT/ML FlexTouch Pen Generic drug: insulin degludec Inject 50-52 Units into the skin 2 (two) times daily. 50 units every morning and 52 units every evening        Follow-up Information     Paraschos, Alexander, MD. Go in 1 week(s).   Specialty: Cardiology Contact information: [address] [facility] [place] [address] [phone]  Shane Crutch, [place]. Schedule an appointment as soon as possible for a visit in 1 week(s).   Specialty: Family Medicine Contact information: [address] [place] [address] [phone]                Discharge Exam: [name] [name] Weights   02/22/23 2039  Weight: 74.8 kg   General.  Well-developed gentleman, in no acute distress. Pulmonary.  Lungs clear bilaterally, normal respiratory effort. CV.  Regular rate and rhythm, no JVD, rub or murmur. Abdomen.  Soft, nontender, nondistended, BS positive. CNS.  Alert and oriented .  No focal neurologic deficit. Extremities.  No edema, no cyanosis, pulses intact and symmetrical. Psychiatry.  Judgment and insight appears normal.   Condition at discharge: stable  The results of significant diagnostics from this hospitalization  (including imaging, microbiology, ancillary and laboratory) are listed below for reference.   Imaging Studies: NM Myocar Multi W/Spect W/Wall Motion / EF  Result Date: 02/25/2023   Findings are equivocal. The study is intermediate risk.   No ST deviation was noted.   LV perfusion is equivocal. There is no evidence of ischemia. There is evidence of infarction.   Left ventricular function is normal. End diastolic cavity size is normal. End systolic cavity size is normal. Conclusion Borderline myocardial perfusion scan with evidence of inferior lateral basal apical persistent defect with minimal border zone redistribution no obvious evidence of significant reversible ischemia ejection fraction of 55% This is intermediate risk and would recommend conservative medical therapy Maximize GDMT  DG Chest 2 View  Result Date: 02/22/2023 CLINICAL DATA:  Chest pain and shortness of breath for 1 hour EXAM: CHEST - 2 VIEW COMPARISON:  04/25/2022 FINDINGS: Frontal and lateral views of the chest are obtained on 3 images. Stable postsurgical changes from CABG, with retained epicardial pacing wires again noted. Mild enlargement the cardiac silhouette. No acute airspace disease, effusion, or pneumothorax. No acute bony abnormalities. IMPRESSION: 1. Mild enlarged cardiac silhouette, increased since prior study. If pericardial effusion is a concern, echocardiography could be performed. 2. No acute airspace disease. Electronically Signed   By: Sharlet Salina M.D.   On: 02/22/2023 21:03    Microbiology: Results for orders placed or performed during the hospital encounter of 10/08/20  SARS Coronavirus 2 by RT PCR (hospital order, performed in [facility] hospital lab) Nasopharyngeal Nasopharyngeal Swab     Status: None   Collection Time: 10/09/20  1:11 AM   Specimen: Nasopharyngeal Swab  Result Value Ref Range Status   SARS Coronavirus 2 NEGATIVE NEGATIVE Final    Comment: (NOTE) SARS-CoV-2 target nucleic acids are NOT  DETECTED.  The SARS-CoV-2 RNA is generally detectable in upper and lower respiratory specimens during the acute phase of infection. The lowest concentration of SARS-CoV-2 viral copies this assay can detect is 250 copies / mL. A negative result does not preclude SARS-CoV-2 infection and should not be used as the sole basis for treatment or other patient management decisions.  A negative result may occur with improper specimen collection / handling, submission of specimen other than nasopharyngeal swab, presence of viral mutation(s) within the areas targeted by this assay, and inadequate number of viral copies (<250 copies / mL). A negative result must be combined with clinical observations, patient history, and epidemiological information.  Fact Sheet for Patients:   BoilerBrush.com.cy  Fact Sheet for Healthcare Providers: https://pope.com/  This test is not yet approved or  cleared by the [place] FDA and has been authorized for detection and/or diagnosis  of SARS-CoV-2 by FDA under an Emergency Use Authorization (EUA).  This EUA will remain in effect (meaning this test can be used) for the duration of the COVID-19 declaration under Section 564(b)(1) of the Act, 21 U.S.C. section 360bbb-3(b)(1), unless the authorization is terminated or revoked sooner.  Performed at [facility], [address] [place], [place] [address]     Labs: CBC: Recent Labs  Lab 02/22/23 2042 02/24/23 0406 02/25/23 0337  WBC 6.2 6.2 6.1  HGB 12.3* 11.3* 11.6*  HCT 36.5* 32.9* 34.0*  MCV 89.9 89.2 88.5  PLT 156 127* 131*   Basic Metabolic Panel: Recent Labs  Lab 02/22/23 2042 02/25/23 0337  NA 138 139  K 4.3 3.9  CL 108 108  CO2 23 23  GLUCOSE 182* 84  BUN 23 18  CREATININE 1.66* 1.02  CALCIUM 8.3* 8.8*   Liver Function Tests: No results for input(s): "AST", "ALT", "ALKPHOS", "BILITOT", "PROT", "ALBUMIN" in the last 168  hours. CBG: Recent Labs  Lab 02/24/23 2342 02/25/23 0004 02/25/23 0328 02/25/23 0737 02/25/23 1245  GLUCAP 63* 82 86 91 92    Discharge time spent: greater than 30 minutes.  This record has been created using Conservation officer, historic buildings. Errors have been sought and corrected,but may not always be located. Such creation errors do not reflect on the standard of care.   Signed: Arnetha Courser, MD [facility] 02/25/2023

## 2023-02-25 NOTE — Progress Notes (Signed)
[facility] CLINIC CARDIOLOGY CONSULT NOTE       Patient ID: [name] [name]. MRN: [id number] DOB/AGE: [id number] 69 y.o.  Admit date: 02/22/2023 Referring Physician Dr. Lindajo Royal  Primary Physician Shane Crutch, PA-C Primary Cardiologist Dr. Gwen Pounds  Reason for Consultation unstable angina  HPI: [name] [name]. Is a 20yoM with a PMH of CAD s/p CABG x4 in 2013 (SVG to RPDA, SVG to diagonal, free LIMA to LAD occluded, SVG to LCx occluded) with redo CABG x1 2017 with free RIMA to LAD at [facility], HFpEF (LVEF >55%, G1 DD 03/07/2022, mild-moderate AS, PAD s/p kissing stent placements to bilateral common iliac arteries 11/20/2021, hypertension, hyperlipidemia, history of esophageal stricture, chronic pain syndrome who presented to [facility] ED 02/22/2023 with substernal chest pain that radiated to both arms while seated at rest. Cardiology is consulted for further assistance.   Interval History:  - NPO for myoview this AM  - remains chest pain free, no dyspnea, palpitations, orthopnea, or peripheral edema  Review of systems complete and found to be negative unless listed above     Past Medical History:  Diagnosis Date   Benign esophageal stricture    Cardiomyopathy, secondary (HCC)    Coronary artery disease    Diabetes mellitus without complication (HCC)    Diverticulitis    GI bleed    Gout    Hemorrhoids    Hyperlipidemia    Hypertension    Sleep apnea    Tubular adenoma of colon     Past Surgical History:  Procedure Laterality Date   cardiac bypass     CARDIAC CATHETERIZATION Left 02/03/2016   Procedure: Left Heart Cath and Coronary Angiography;  Surgeon: Dalia Heading, MD;  Location: [facility] INVASIVE CV LAB;  Service: Cardiovascular;  Laterality: Left;   CARDIAC SURGERY     COLONOSCOPY WITH PROPOFOL N/A 04/29/2015   Procedure: COLONOSCOPY WITH PROPOFOL;  Surgeon: Wallace Cullens, MD;  Location: [facility] ENDOSCOPY;  Service: Gastroenterology;  Laterality: N/A;   COLONOSCOPY WITH PROPOFOL  N/A 07/20/2020   Procedure: COLONOSCOPY WITH PROPOFOL;  Surgeon: Toledo, Boykin Nearing, MD;  Location: [facility] ENDOSCOPY;  Service: Gastroenterology;  Laterality: N/A;   CORONARY ANGIOPLASTY WITH STENT PLACEMENT     x2   CORONARY ARTERY BYPASS GRAFT     2013 & 2017   ESOPHAGOGASTRODUODENOSCOPY (EGD) WITH PROPOFOL N/A 04/29/2015   Procedure: ESOPHAGOGASTRODUODENOSCOPY (EGD) WITH PROPOFOL;  Surgeon: Wallace Cullens, MD;  Location: [facility] ENDOSCOPY;  Service: Gastroenterology;  Laterality: N/A;   LOWER EXTREMITY ANGIOGRAPHY Left 11/20/2021   Procedure: Lower Extremity Angiography;  Surgeon: Annice Needy, MD;  Location: [facility] INVASIVE CV LAB;  Service: Cardiovascular;  Laterality: Left;   SEPTOPLASTY      Medications Prior to Admission  Medication Sig Dispense Refill Last Dose   allopurinol (ZYLOPRIM) 100 MG tablet Take 100 mg by mouth daily.    02/22/2023   aspirin EC 81 MG tablet Take 81 mg by mouth daily. Swallow whole.   02/22/2023   atorvastatin (LIPITOR) 40 MG tablet Take 40 mg by mouth at bedtime.    Past Week   Cholecalciferol 25 MCG (1000 UT) tablet Take 2,000 Units by mouth daily.   02/22/2023   clopidogrel (PLAVIX) 75 MG tablet Take 1 tablet (75 mg total) by mouth daily. 30 tablet 11 02/22/2023   gabapentin (NEURONTIN) 600 MG tablet Take 1 tablet (600 mg total) by mouth 2 (two) times daily. 60 tablet 2 02/22/2023   HYDROcodone-acetaminophen (NORCO/VICODIN) 5-325 MG tablet Take 1  tablet by mouth 2 (two) times daily as needed for severe pain. Must last 30 days 60 tablet 0 02/22/2023   isosorbide mononitrate (IMDUR) 30 MG 24 hr tablet Take 30 mg by mouth every morning.   1 02/22/2023   metoprolol tartrate (LOPRESSOR) 50 MG tablet Take 1 tablet (50 mg total) by mouth 2 (two) times daily. (Patient taking differently: Take 50 mg by mouth 3 (three) times daily.)   02/22/2023   Multiple Vitamin (MULTIVITAMIN WITH MINERALS) TABS tablet Take 1 tablet by mouth every evening.   02/22/2023   naloxone (NARCAN) nasal spray 4  mg/0.1 mL Place 1 spray into the nose as needed for up to 365 doses (for opioid-induced respiratory depresssion). In case of emergency (overdose), spray once into each nostril. If no response within 3 minutes, repeat application and call 911. 1 each 0 unknown   nitroGLYCERIN (NITROSTAT) 0.4 MG SL tablet Place 0.4 mg under the tongue every 5 (five) minutes x 3 doses as needed for chest pain.    02/22/2023   OZEMPIC, 1 MG/DOSE, 4 MG/3ML SOPN Inject 1 mg into the skin once a week.   02/20/2023   Potassium 99 MG TABS Take 1 tablet by mouth daily.   02/22/2023   TRESIBA FLEXTOUCH 100 UNIT/ML FlexTouch Pen Inject 50-52 Units into the skin 2 (two) times daily. 50 units every morning and 52 units every evening   02/22/2023   valsartan (DIOVAN) 80 MG tablet Take 80 mg by mouth 2 (two) times daily.   02/22/2023   B-D UF III MINI PEN NEEDLES 31G X 5 MM MISC 50 mm 2 (two) times daily. 4 mm needles      BD PEN NEEDLE NANO 2ND GEN 32G X 4 MM MISC Inject 1 applicator into the skin in the morning and at bedtime.      HYDROcodone-acetaminophen (NORCO/VICODIN) 5-325 MG tablet Take 1 tablet by mouth 2 (two) times daily as needed for severe pain. Must last 30 days 60 tablet 0    losartan (COZAAR) 100 MG tablet Take 1 tablet (100 mg total) by mouth daily. 30 tablet 0    pantoprazole (PROTONIX) 40 MG tablet Take 1 tablet (40 mg total) by mouth daily. 30 tablet 0    Social History   Socioeconomic History   Marital status: Widowed    Spouse name: Not on file   Number of children: Not on file   Years of education: Not on file   Highest education level: Not on file  Occupational History   Occupation: retired  Tobacco Use   Smoking status: Former    Years: 25    Types: Cigarettes   Smokeless tobacco: Former  Substance and Sexual Activity   Alcohol use: Yes    Alcohol/week: 14.0 standard drinks of alcohol    Types: 14 Shots of liquor per week    Comment: couple drinks daily   Drug use: No   Sexual activity: Not on  file  Other Topics Concern   Not on file  Social History Narrative   ED visit around the end of October to have chest pain checked which was benign   Social Determinants of Health   Financial Resource Strain: Not on file  Food Insecurity: No Food Insecurity (02/23/2023)   Hunger Vital Sign    Worried About Running Out of Food in the Last Year: Never true    Ran Out of Food in the Last Year: Never true  Transportation Needs: No Transportation Needs (02/23/2023)  PRAPARE - Administrator, Civil Service (Medical): No    Lack of Transportation (Non-Medical): No  Physical Activity: Not on file  Stress: Not on file  Social Connections: Not on file  Intimate Partner Violence: Not At Risk (02/23/2023)   Humiliation, Afraid, Rape, and Kick questionnaire    Fear of Current or Ex-Partner: No    Emotionally Abused: No    Physically Abused: No    Sexually Abused: No    Family History  Problem Relation Age of Onset   Heart disease Mother    Hypertension Father       Intake/Output Summary (Last 24 hours) at 02/25/2023 0809 Last data filed at 02/25/2023 0600 Gross per 24 hour  Intake 1583.27 ml  Output --  Net 1583.27 ml    Vitals:   02/24/23 2250 02/25/23 0011 02/25/23 0312 02/25/23 0740  BP: (!) 161/88 (!) 155/83 (!) 144/80 (!) 143/71  Pulse:  79 74 71  Resp: 18 (!) 21 18 20   Temp: 97.8 F (36.6 C) 98.1 F (36.7 C) 98.1 F (36.7 C) 97.8 F (36.6 C)  TempSrc: Oral Oral Oral Oral  SpO2: 98% 97% 95% 96%  Weight:      Height:        PHYSICAL EXAM General: comfortable appearing male , well nourished, in no acute distress. Laying flat in bed with eyes closed, awakens easily to voice HEENT:  Normocephalic and atraumatic. Neck:  No JVD.  Lungs: Normal respiratory effort on room air. Clear bilaterally to auscultation. No wheezes, crackles, rhonchi.  Chest: tenderness to palpation along [name] sternotomy scar, no significant redness or evidence of dehiscence/infection   Heart: HRRR . Normal S1 and S2, 3/6 systolic murmur heard throughout  Abdomen: Non-distended appearing.  Msk: Normal strength and tone for age. Extremities: Warm and well perfused. No clubbing, cyanosis. No peripheral edema.  Neuro: Alert and oriented X 3. Psych:  Answers questions appropriately.   Labs: Basic Metabolic Panel: Recent Labs    02/22/23 2042 02/25/23 0337  NA 138 139  K 4.3 3.9  CL 108 108  CO2 23 23  GLUCOSE 182* 84  BUN 23 18  CREATININE 1.66* 1.02  CALCIUM 8.3* 8.8*   Liver Function Tests: No results for input(s): "AST", "ALT", "ALKPHOS", "BILITOT", "PROT", "ALBUMIN" in the last 72 hours. No results for input(s): "LIPASE", "AMYLASE" in the last 72 hours. CBC: Recent Labs    02/24/23 0406 02/25/23 0337  WBC 6.2 6.1  HGB 11.3* 11.6*  HCT 32.9* 34.0*  MCV 89.2 88.5  PLT 127* 131*   Cardiac Enzymes: Recent Labs    02/22/23 2042 02/22/23 2224  TROPONINIHS 18* 81*   BNP: No results for input(s): "BNP" in the last 72 hours. D-Dimer: No results for input(s): "DDIMER" in the last 72 hours. Hemoglobin A1C: Recent Labs    02/23/23 0426  HGBA1C 5.8*   Fasting Lipid Panel: No results for input(s): "CHOL", "HDL", "LDLCALC", "TRIG", "CHOLHDL", "LDLDIRECT" in the last 72 hours. Thyroid Function Tests: No results for input(s): "TSH", "T4TOTAL", "T3FREE", "THYROIDAB" in the last 72 hours.  Invalid input(s): "FREET3" Anemia Panel: No results for input(s): "VITAMINB12", "FOLATE", "FERRITIN", "TIBC", "IRON", "RETICCTPCT" in the last 72 hours.   Radiology: DG Chest 2 View  Result Date: 02/22/2023 CLINICAL DATA:  Chest pain and shortness of breath for 1 hour EXAM: CHEST - 2 VIEW COMPARISON:  04/25/2022 FINDINGS: Frontal and lateral views of the chest are obtained on 3 images. Stable postsurgical changes from CABG, with retained  epicardial pacing wires again noted. Mild enlargement the cardiac silhouette. No acute airspace disease, effusion, or pneumothorax.  No acute bony abnormalities. IMPRESSION: 1. Mild enlarged cardiac silhouette, increased since prior study. If pericardial effusion is a concern, echocardiography could be performed. 2. No acute airspace disease. Electronically Signed   By: Sharlet Salina M.D.   On: 02/22/2023 21:03    05/03/2022 lexiscan myoview   Findings are consistent with ischemia and prior myocardial infarction. The study is low risk.   No ST deviation was noted.   Left ventricular function is normal. End diastolic cavity size is normal. End systolic cavity size is normal.   1.  Mild to moderate inferior scar 2.  Mild, focal apical ischemia 3.  Normal left ventricular function 4.  Low cardiovascular risk   Recommendations   1.  Initial medical therapy 2.  Follow-up Dr. Gwen Pounds 1 to 2 weeks  ECHO 03/07/2022 DOPPLER ECHO and OTHER SPECIAL PROCEDURES                 Aortic: TRIVIAL AR                 MILD AS                         327.0 cm/sec peak vel      42.8 mmHg peak grad                         18.6 mmHg mean grad        1.0 cm^2 by DOPPLER                 Mitral: MILD MR                    No MS                         MV Inflow E Vel = 111.0 cm/sec      MV Annulus E'Vel = 9.0 cm/sec                         E/E'Ratio = 12.3              Tricuspid: MILD TR                    No TS                         268.0 cm/sec peak TR vel   31.7 mmHg peak RV pressure              Pulmonary: TRIVIAL PR                 No PS                         128.0 cm/sec peak vel      6.6 mmHg peak grad  _________________________________________________________________________________________  INTERPRETATION  NORMAL LEFT VENTRICULAR SYSTOLIC FUNCTION   WITH MILD LVH  NORMAL RIGHT VENTRICULAR SYSTOLIC FUNCTION  MILD VALVULAR REGURGITATION (See above)  MILD VALVULAR STENOSIS (See above)  ESTIMATED LVEF >55%  CALCULATED: 64.5%  GLS: -18.5%  MILD to MODERATEAS: MAX VEL. 3.74m/s; AVA: 1.0cm^2  (PREV. ECHO: 2.21m/s; AVA: 1.48cm^2)   MODERATELY DILATED MAIN PULMONARY ARTERY MEASURING 3.0cm  _________________________________________________________________________________________  Electronically  signed by         Sena Slate, MD on 03/09/2022 02: 01 PM           Performed By: Verdis Prime     Ordering Physician: Edison Pace reviewed by me (LT) 02/25/2023 : NSR 70s-80s  EKG reviewed by me: NSR RBBB rate 81  Data reviewed by me (LT) 02/25/2023: ed note, admission H&P, hospitalist progress note,  last 24h vitals tele labs imaging I/O   Principal Problem:   Unstable angina (HCC) Active Problems:   OSA (obstructive sleep apnea)   Long term prescription opiate use   S/P CABG 2013 with 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 artery bypass graft)   Chronic pain syndrome   Uncontrolled type 2 diabetes mellitus with hypoglycemia, with long-term current use of insulin (HCC)   PAD S/P bilateral common and iliac artery stents   History of esophageal stricture s/p dilatation in 2016   AKI (acute kidney injury) (HCC)    ASSESSMENT AND PLAN:  [name] [name]. Is a 61yoM with a PMH of CAD s/p CABG x4 in 2013 (SVG to RPDA, SVG to diagonal, free LIMA to LAD occluded, SVG to LCx occluded) with redo CABG x1 2017 with free RIMA to LAD at [facility], HFpEF (LVEF >55%, G1 DD 03/07/2022, mild-moderate AS, PAD s/p kissing stent placements to bilateral common iliac arteries 11/20/2021, hypertension, hyperlipidemia, history of esophageal stricture, chronic pain syndrome who presented to [facility] ED 02/22/2023 with substernal chest pain that radiated to both arms while seated at rest. Cardiology is consulted for further assistance.   # unstable angina  # CAD s/p CABG Presents with chest pain that radiated down to both of his arms that occurred at rest and was eventually relieved with sublingual nitroglycerin/Nitropaste.  Troponins borderline elevated at 18, 81, but fortunately without chest pain recurrence while hospitalized. -Continue aspirin 81 mg daily and  Plavix 75 mg daily -continue atorvastatin 40mg  daily  -can stop IV heparin (started 0400 on 6/22) -Continue metoprolol tartrate 50 mg daily, irbesartan 150 mg daily.  Increase isosorbide from 30 mg daily to 60 mg daily starting tomorrow. Eugenie Birks Myoview this morning, further recommendations based on this results. - will arrange for close outpatient follow up with Dr. Darrold Junker 1-2 weeks following discharge  # chronic HFpEF  # mild - mod AS Euvolemic on exam, has a known history of mild-moderate aortic stenosis from echo in July 2023.  Repeat echo either during this admission/on an outpatient basis at next follow-up visit for continued surveillance of his AoV and LVEF.  # CKD IIIb Cr up to 1.66 on admission, improved to 1.02 following IVF   This patient's plan of care was discussed and created with Dr. Juliann Pares and he is in agreement.  Signed: Rebeca Allegra , PA-C 02/25/2023, 8:09 AM [facility]

## 2023-02-25 NOTE — Progress Notes (Signed)
*  PRELIMINARY RESULTS* Echocardiogram 2D Echocardiogram has been performed.  Cristela Blue 02/25/2023, 2:29 PM

## 2023-02-25 NOTE — Hospital Course (Addendum)
Taken from prior notes.  [name] [name] is an 67 year old M with DM, HTN, OSA on CPAP, chronic pain on daily opiates, PVD status post iliac stents, and CAD status post CABG and PCI who presented for substernal chest pain radiated to both arms while seated and at rest.   6/24: Patient was admitted for concern of unstable angina, cardiology was consulted and he was placed on heparin infusion for 48 hours.  Labs stable. myoview done today- did show persistent inferior and lateral defect but without new reversible ischemia. Inferior scar was present on last study in August 2023. No further invasive diagnostics recommended and he can be discharged from a cardiac perspective. He will follow-up with cardiology in 1 to 2 weeks for further recommendations.  His chest pain resolved.  Cardiology increased the dose of Imdur, switch to irbesartan.  Patient did had some hypoglycemia on admission.  Need to discuss his diabetes medication with primary care provider for further management.  Patient was advised to start taking medications according to his new prescriptions and follow-up closely with his cardiologist for further recommendations.

## 2023-02-26 LAB — ECHOCARDIOGRAM COMPLETE
AR max vel: 1.25 cm2
AV Area VTI: 1.08 cm2
AV Area mean vel: 1.12 cm2
AV Mean grad: 15.3 mmHg
AV Peak grad: 23.8 mmHg
Ao pk vel: 2.44 m/s
Area-P 1/2: 4.71 cm2
Height: 63 in
MV VTI: 1.75 cm2
S' Lateral: 3.1 cm

## 2023-03-17 NOTE — Progress Notes (Signed)
PROVIDER NOTE: Information contained herein reflects review and annotations entered in association with encounter. Interpretation of such information and data should be left to medically-trained personnel. Information provided to patient can be located elsewhere in the medical record under "Patient Instructions". Document created using STT-dictation technology, any transcriptional errors that may result from process are unintentional.    Patient: [name] [name].  Service Category: E/M  Provider: Oswaldo Done, MD  DOB: March 18, 1956  DOS: 03/18/2023  Referring Provider: Shane Crutch, [place]  MRN: [id number]  Specialty: Interventional Pain Management  PCP: Shane Crutch, PA  Type: Established Patient  Setting: Ambulatory outpatient    Location: Office  Delivery: Face-to-face     HPI  Mr. [name] Moure., a 67 y.o. year old male, is here today because of his Chronic pain syndrome [G89.4]. [name] primary complain today is Leg Pain (right) and Chest Pain  Pertinent problems: [name] has Chronic shoulder pain (3ry area of Pain) (Right); Chronic hip pain (Left); Chronic chest wall pain (1ry area of Pain) (Incisional Midline) (since 04/22/2012); Chronic lower extremity pain (2ry area of Pain) (Bilateral) (R>L); Chronic sacroiliac joint pain (Bilateral) (L>R); Neuropathic pain; Neurogenic pain; Incisional pain; Lumbar facet syndrome (Bilateral) (R>L); Lumbar spondylosis; Keloid skin disorder; Costochondritis; Brachial plexus injury, right; Right sided weakness; Chronic pain syndrome; DDD (degenerative disc disease), lumbosacral; Lumbar facet arthropathy (Multilevel) (Bilateral); 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 Pain Assessment: Severity of  Chronic pain is reported as a 4 /10. Location: Leg Right/denies, lower calve. Onset: More than a month ago. Quality: Aching, Discomfort, Dull, Sore (chest, very sensitive, hard to touch it, pressure). Timing: Intermittent. Modifying factor(s): pain meds, rest, light clothing to chest area. Vitals:  height is 5\' 2"  (1.575 m) and weight is 165 lb (74.8 kg). His temperature is 97.2 F (36.2 C) (abnormal). His blood pressure is 157/77 (abnormal) and his pulse is 73. His respiration is 16 and oxygen saturation is 99%.  BMI: Estimated body mass index is 30.18 kg/m as calculated from the following:   Height as of this encounter: 5\' 2"  (1.575 m).   Weight as of this encounter: 165 lb (74.8 kg). Last encounter: 02/11/2023. Last procedure: Visit date not found.  Reason for encounter: medication management.  The patient indicates doing well with the current medication regimen. No adverse reactions or side effects reported to the medications.  The patient indicates recently having being hospitalized secondary to cardiac issues.  He is not having any problems with his medicines and his pain seems to be under control.  He explains that the hospitalization is the reason why he has more pills left today on his counts.  RTCB: 06/19/2023   Pharmacotherapy Assessment  Analgesic: Hydrocodone/APAP 5/325 mg, 1 tab PO BID (10 mg/day of hydrocodone) MME/day: 10 mg/day.   Monitoring: [place] PMP: PDMP reviewed during this encounter.       Pharmacotherapy: No side-effects or adverse reactions reported. Compliance: No problems identified. Effectiveness: Clinically acceptable.  Newman Pies, RN  03/18/2023 11:11 AM  Sign when Signing Visit Nursing Pain Medication Assessment:  Safety precautions to be maintained throughout the outpatient stay will include: orient to surroundings, keep bed in low position, maintain call bell within reach at all times, provide assistance with transfer out of bed and ambulation.   Medication  Inspection Compliance: Pill count conducted under aseptic conditions, in front of the patient. Neither the pills nor the bottle was removed from the patient's sight at any time. Once count was completed pills were immediately returned to the patient in their original bottle.  Medication: Hydrocodone/APAP Pill/Patch Count:  11 of 60 pills remain Pill/Patch Appearance: Markings consistent with prescribed medication Bottle Appearance: Standard pharmacy container. Clearly labeled. Filled Date: 6 / 10 / 2024 Last Medication intake:  Today    No results found for: "CBDTHCR" No results found for: "D8THCCBX" No results found for: "D9THCCBX"  UDS:  Summary  Date Value Ref Range Status  02/11/2023 Note  Final    Comment:    ==================================================================== [employer] [facility] 13 (MW) ==================================================================== Test                             Result       Flag       Units  Drug Present and Declared for Prescription Verification   Hydrocodone                    1779         EXPECTED   ng/mg creat   Hydromorphone                  394          EXPECTED   ng/mg creat   Dihydrocodeine                 61           EXPECTED   ng/mg creat   Norhydrocodone                 736          EXPECTED   ng/mg creat    Sources of hydrocodone include scheduled prescription medications.    Hydromorphone, dihydrocodeine and norhydrocodone are expected    metabolites of hydrocodone. Hydromorphone and dihydrocodeine are    also available as scheduled prescription medications.  ==================================================================== Test                      Result    Flag   Units      Ref Range   Creatinine              129              mg/dL      >=95 ==================================================================== Declared Medications:  The flagging and interpretation on this report are based on the   following declared medications.  Unexpected results may arise from  inaccuracies in the declared medications.   **Note: The testing scope of this panel includes these medications:   Hydrocodone (Norco)   **Note: The testing scope of this panel does not include the  following reported medications:   Acetaminophen (Norco)  Allopurinol (Zyloprim)  Aspirin  Atorvastatin (Lipitor)  Cholecalciferol  Clopidogrel (Plavix)  Gabapentin (Neurontin)  Insulin Evaristo Bury)  Isosorbide (Imdur)  Losartan (Cozaar)  Metoprolol (Lopressor)  Multivitamin  Naloxone (Narcan)  Nitroglycerin (Nitrostat)  Pantoprazole (Protonix)  Potassium  Semaglutide (Ozempic)  Supplement  Valsartan (Diovan) ==================================================================== For clinical consultation, please call [phone]. ====================================================================       ROS  Constitutional: Denies any fever or chills Gastrointestinal: No reported hemesis, hematochezia, vomiting, or acute GI distress Musculoskeletal: Denies any acute onset joint swelling, redness, loss of ROM, or weakness Neurological: No reported episodes  of acute onset apraxia, aphasia, dysarthria, agnosia, amnesia, paralysis, loss of coordination, or loss of consciousness  Medication Review  Cholecalciferol, HYDROcodone-acetaminophen, Insulin Pen Needle, Potassium, Semaglutide (1 MG/DOSE), allopurinol, aspirin EC, atorvastatin, clopidogrel, gabapentin, insulin degludec, irbesartan, isosorbide mononitrate, metoprolol tartrate, multivitamin with minerals, naloxone, nitroGLYCERIN, pantoprazole, and valsartan  History Review  Allergy: [name] [name] has No Known Allergies. Drug: [name] [name]  reports no history of drug use. Alcohol:  reports current alcohol use of about 14.0 standard drinks of alcohol per week. Tobacco:  reports that he has quit smoking. His smoking use included cigarettes. He has quit using  smokeless tobacco. Social: [name] [name]  reports that he has quit smoking. His smoking use included cigarettes. He has quit using smokeless tobacco. He reports current alcohol use of about 14.0 standard drinks of alcohol per week. He reports that he does not use drugs. Medical:  has a past medical history of Benign esophageal stricture, Cardiomyopathy, secondary (HCC), Coronary artery disease, Diabetes mellitus without complication (HCC), Diverticulitis, GI bleed, Gout, Hemorrhoids, Hyperlipidemia, Hypertension, Sleep apnea, and Tubular adenoma of colon. Surgical: [name] [name]  has a past surgical history that includes Coronary angioplasty with stent; cardiac bypass; Cardiac surgery; Colonoscopy with propofol (N/A, 04/29/2015); Esophagogastroduodenoscopy (egd) with propofol (N/A, 04/29/2015); Septoplasty; Cardiac catheterization (Left, 02/03/2016); Coronary artery bypass graft; Colonoscopy with propofol (N/A, 07/20/2020); and Lower Extremity Angiography (Left, 11/20/2021). Family: family history includes Heart disease in his mother; Hypertension in his father.  Laboratory Chemistry Profile   Renal Lab Results  Component Value Date   BUN 18 02/25/2023   CREATININE 1.02 02/25/2023   GFRAA >60 05/07/2019   GFRNONAA >60 02/25/2023    Hepatic Lab Results  Component Value Date   AST 52 (H) 04/25/2022   ALT 48 (H) 04/25/2022   ALBUMIN 4.1 04/25/2022   ALKPHOS 88 04/25/2022   HCVAB NON REACTIVE 10/09/2020   LIPASE 39 07/16/2021    Electrolytes Lab Results  Component Value Date   NA 139 02/25/2023   K 3.9 02/25/2023   CL 108 02/25/2023   CALCIUM 8.8 (L) 02/25/2023   MG 2.2 10/09/2020   PHOS 3.7 10/09/2020    Bone Lab Results  Component Value Date   VD25OH 74.7 05/07/2019   VD125OH2TOT 30.8 10/26/2015    Inflammation (CRP: Acute Phase) (ESR: Chronic Phase) Lab Results  Component Value Date   CRP 0.9 05/07/2019   ESRSEDRATE 41 (H) 05/07/2019   LATICACIDVEN 1.9 10/09/2020         Note:  Above Lab results reviewed.  Recent Imaging Review  ECHOCARDIOGRAM COMPLETE    ECHOCARDIOGRAM REPORT       Patient Name:   [name] Schnepf. Date of Exam: 02/25/2023 Medical Rec #:  [id number]          Height:       63.0 in Accession #:    [id number]         Weight:       165.0 lb Date of Birth:  19-Mar-1956           BSA:          1.782 m Patient Age:    67 years           BP:           143/71 mmHg Patient Gender: M                  HR:           71 bpm. Exam Location:  [facility]  Procedure: 2D Echo, Cardiac Doppler and Color Doppler  Indications:     Aortic stenosis I35.0   History:         Patient has no prior history of Echocardiogram examinations.                  CAD; Risk Factors:Diabetes and Hypertension.   Sonographer:     Cristela Blue Referring Phys:  [id number] LILY MICHELLE TANG Diagnosing Phys: Alwyn Pea MD  IMPRESSIONS   1. Left ventricular ejection fraction, by estimation, is 50 to 55%. The left ventricle has low normal function. The left ventricle has no regional wall motion abnormalities. Left ventricular diastolic parameters are consistent with Grade II diastolic  dysfunction (pseudonormalization).  2. Right ventricular systolic function is normal. The right ventricular size is normal.  3. The mitral valve is normal in structure. Mild mitral valve regurgitation.  4. Tricuspid valve regurgitation is mild to moderate.  5. The aortic valve is calcified. Aortic valve regurgitation is mild.  FINDINGS  Left Ventricle: Left ventricular ejection fraction, by estimation, is 50 to 55%. The left ventricle has low normal function. The left ventricle has no regional wall motion abnormalities. The left ventricular internal cavity size was normal in size.  There is no left ventricular hypertrophy. Left ventricular diastolic parameters are consistent with Grade II diastolic dysfunction (pseudonormalization).  Right Ventricle: The right ventricular size is normal. No increase in  right ventricular wall thickness. Right ventricular systolic function is normal.  Left Atrium: Left atrial size was normal in size.  Right Atrium: Right atrial size was normal in size.  Pericardium: There is no evidence of pericardial effusion.  Mitral Valve: The mitral valve is normal in structure. Mild mitral valve regurgitation. MV peak gradient, 8.8 mmHg. The mean mitral valve gradient is 4.0 mmHg.  Tricuspid Valve: The tricuspid valve is normal in structure. Tricuspid valve regurgitation is mild to moderate.  Aortic Valve: The aortic valve is calcified. Aortic valve regurgitation is mild. Aortic valve mean gradient measures 15.3 mmHg. Aortic valve peak gradient measures 23.8 mmHg. Aortic valve area, by VTI measures 1.08 cm.  Pulmonic Valve: The pulmonic valve was normal in structure. Pulmonic valve regurgitation is not visualized.  Aorta: The ascending aorta was not well visualized.  IAS/Shunts: No atrial level shunt detected by color flow Doppler.    LEFT VENTRICLE PLAX 2D LVIDd:         4.20 cm   Diastology LVIDs:         3.10 cm   LV e' medial:    5.66 cm/s LV PW:         1.40 cm   LV E/e' medial:  20.5 LV IVS:        1.20 cm   LV e' lateral:   6.53 cm/s LVOT diam:     2.00 cm   LV E/e' lateral: 17.8 LV SV:         63 LV SV Index:   35 LVOT Area:     3.14 cm    RIGHT VENTRICLE RV Basal diam:  4.60 cm RV Mid diam:    3.40 cm RV S prime:     7.94 cm/s  LEFT ATRIUM             Index        RIGHT ATRIUM           Index LA diam:        3.60 cm 2.02 cm/m  RA Area:     14.80 cm LA Vol (A2C):   43.6 ml 24.47 ml/m  RA Volume:   32.70 ml  18.35 ml/m LA Vol (A4C):   23.4 ml 13.13 ml/m LA Biplane Vol: 34.9 ml 19.59 ml/m  AORTIC VALVE AV Area (Vmax):    1.25 cm AV Area (Vmean):   1.12 cm AV Area (VTI):     1.08 cm AV Vmax:           244.00 cm/s AV Vmean:          185.333 cm/s AV VTI:            0.584 m AV Peak Grad:      23.8 mmHg AV Mean Grad:      15.3  mmHg LVOT Vmax:         97.40 cm/s LVOT Vmean:        66.200 cm/s LVOT VTI:          0.201 m LVOT/AV VTI ratio: 0.34   AORTA Ao Root diam: 3.20 cm  MITRAL VALVE                TRICUSPID VALVE MV Area (PHT): 4.71 cm     TR Peak grad:   32.3 mmHg MV Area VTI:   1.75 cm     TR Vmax:        284.00 cm/s MV Peak grad:  8.8 mmHg MV Mean grad:  4.0 mmHg     SHUNTS MV Vmax:       1.48 m/s     Systemic VTI:  0.20 m MV Vmean:      87.0 cm/s    Systemic Diam: 2.00 cm MV Decel Time: 161 msec MV E velocity: 116.00 cm/s MV A velocity: 83.30 cm/s MV E/A ratio:  1.39  Dwayne D Callwood MD Electronically signed by Alwyn Pea MD Signature Date/Time: 02/26/2023/5:13:18 PM      Final   Note: Reviewed        Physical Exam  General appearance: Well nourished, well developed, and well hydrated. In no apparent acute distress Mental status: Alert, oriented x 3 (person, place, & time)       Respiratory: No evidence of acute respiratory distress Eyes: PERLA Vitals: BP (!) 157/77   Pulse 73   Temp (!) 97.2 F (36.2 C)   Resp 16   Ht 5\' 2"  (1.575 m)   Wt 165 lb (74.8 kg)   SpO2 99%   BMI 30.18 kg/m  BMI: Estimated body mass index is 30.18 kg/m as calculated from the following:   Height as of this encounter: 5\' 2"  (1.575 m).   Weight as of this encounter: 165 lb (74.8 kg). Ideal: Ideal body weight: 54.6 kg (120 lb 5.9 oz) Adjusted ideal body weight: 62.7 kg (138 lb 3.6 oz)  Assessment   Diagnosis Status  1. Chronic pain syndrome   2. Chronic chest wall pain (1ry area of Pain) (Incisional Midline) (since 04/22/2012)   3. Chronic lower extremity pain (2ry area of Pain) (Bilateral) (R>L)   4. Chronic shoulder pain (3ry area of Pain) (Right)   5. Lumbar facet syndrome (Bilateral) (R>L)   6. Chronic low back pain (Bilateral) (R>L) w/o sciatica   7. Pharmacologic therapy   8. Chronic use of opiate for therapeutic purpose   9. Encounter for medication management   10. Encounter for  chronic pain management    Controlled Controlled Controlled   Updated Problems: No problems updated.  Plan  of Care  Problem-specific:  No problem-specific Assessment & Plan notes found for this encounter.  Mr. [name] Eoff. has a current medication list which includes the following long-term medication(s): allopurinol, atorvastatin, gabapentin, [START ON 03/21/2023] hydrocodone-acetaminophen, [START ON 04/20/2023] hydrocodone-acetaminophen, [START ON 05/20/2023] hydrocodone-acetaminophen, irbesartan, isosorbide mononitrate, metoprolol tartrate, and pantoprazole.  Pharmacotherapy (Medications Ordered): Meds ordered this encounter  Medications   HYDROcodone-acetaminophen (NORCO/VICODIN) 5-325 MG tablet    Sig: Take 1 tablet by mouth 2 (two) times daily as needed for severe pain. Must last 30 days    Dispense:  60 tablet    Refill:  0    DO NOT: delete (not duplicate); no partial-fill (will deny script to complete), no refill request (F/U required). DISPENSE: 1 day early if closed on fill date. WARN: No CNS-depressants within 8 hrs of med.   HYDROcodone-acetaminophen (NORCO/VICODIN) 5-325 MG tablet    Sig: Take 1 tablet by mouth 2 (two) times daily as needed for severe pain. Must last 30 days    Dispense:  60 tablet    Refill:  0    DO NOT: delete (not duplicate); no partial-fill (will deny script to complete), no refill request (F/U required). DISPENSE: 1 day early if closed on fill date. WARN: No CNS-depressants within 8 hrs of med.   HYDROcodone-acetaminophen (NORCO/VICODIN) 5-325 MG tablet    Sig: Take 1 tablet by mouth 2 (two) times daily as needed for severe pain. Must last 30 days    Dispense:  60 tablet    Refill:  0    DO NOT: delete (not duplicate); no partial-fill (will deny script to complete), no refill request (F/U required). DISPENSE: 1 day early if closed on fill date. WARN: No CNS-depressants within 8 hrs of med.   Orders:  No orders of the defined types were placed  in this encounter.  Follow-up plan:   Return in about 3 months (around 06/19/2023) for Eval-day (M,W), (F2F), (MM).      Interventional Therapies  Risk Factors  Considerations:      Planned  Pending:      Under consideration:   Therapeutic bilateral Lumbar facet + bilateral sacroiliac joint RFA #1    Completed:   Palliative left IA hip inj. x1 (05/13/2018) (100/100/75/>75)  Palliative bilateral lumbar facet MBB x2 (02/21/2016)  Palliative bilateral SI Blk x1 (02/21/2016)    Therapeutic  Palliative (PRN) options:   Palliative left intra-articular hip joint injection #2  Palliative bilateral lumbar facet block #3  Palliative bilateral sacroiliac joint block #2    Pharmacotherapy  Nonopioids transfer 07/25/2020: Gabapentin         Recent Visits Date Type Provider Dept  02/11/23 Office Visit Delano Metz, MD [facility]  Showing recent visits within past 90 days and meeting all other requirements Today's Visits Date Type Provider Dept  03/18/23 Office Visit Delano Metz, MD [facility]  Showing today's visits and meeting all other requirements Future Appointments Date Type Provider Dept  06/10/23 Appointment Delano Metz, MD [facility]  Showing future appointments within next 90 days and meeting all other requirements  I discussed the assessment and treatment plan with the patient. The patient was provided an opportunity to ask questions and all were answered. The patient agreed with the plan and demonstrated an understanding of the instructions.  Patient advised to call back or seek an in-person evaluation if the symptoms or condition worsens.  Duration of encounter: 30 minutes.  Total time on encounter, as per [facility]  guidelines included both the face-to-face and non-face-to-face time personally spent by the physician and/or other qualified health care professional(s) on the day of the encounter (includes time in  activities that require the physician or other qualified health care professional and does not include time in activities normally performed by clinical staff). Physician's time may include the following activities when performed: Preparing to see the patient (e.g., pre-charting review of records, searching for previously ordered imaging, lab work, and nerve conduction tests) Review of prior analgesic pharmacotherapies. Reviewing PMP Interpreting ordered tests (e.g., lab work, imaging, nerve conduction tests) Performing post-procedure evaluations, including interpretation of diagnostic procedures Obtaining and/or reviewing separately obtained history Performing a medically appropriate examination and/or evaluation Counseling and educating the patient/family/caregiver Ordering medications, tests, or procedures Referring and communicating with other health care professionals (when not separately reported) Documenting clinical information in the electronic or other health record Independently interpreting results (not separately reported) and communicating results to the patient/ family/caregiver Care coordination (not separately reported)  Note by: Oswaldo Done, MD Date: 03/18/2023; Time: 11:37 AM

## 2023-03-17 NOTE — Patient Instructions (Signed)
____________________________________________________________________________________________  Opioid Pain Medication Update  To: All patients taking opioid pain medications. (I.e.: hydrocodone, hydromorphone, oxycodone, oxymorphone, morphine, codeine, methadone, tapentadol, tramadol, buprenorphine, fentanyl, etc.)  Re: Updated review of side effects and adverse reactions of opioid analgesics, as well as new information about long term effects of this class of medications.  Direct risks of long-term opioid therapy are not limited to opioid addiction and overdose. Potential medical risks include serious fractures, breathing problems during sleep, hyperalgesia, immunosuppression, chronic constipation, bowel obstruction, myocardial infarction, and tooth decay secondary to xerostomia.  Unpredictable adverse effects that can occur even if you take your medication correctly: Cognitive impairment, respiratory depression, and death. Most people think that if they take their medication "correctly", and "as instructed", that they will be safe. Nothing could be farther from the truth. In reality, a significant amount of recorded deaths associated with the use of opioids has occurred in individuals that had taken the medication for a long time, and were taking their medication correctly. The following are examples of how this can happen: Patient taking his/her medication for a long time, as instructed, without any side effects, is given a certain antibiotic or another unrelated medication, which in turn triggers a "Drug-to-drug interaction" leading to disorientation, cognitive impairment, impaired reflexes, respiratory depression or an untoward event leading to serious bodily harm or injury, including death.  Patient taking his/her medication for a long time, as instructed, without any side effects, develops an acute impairment of liver and/or kidney function. This will lead to a rapid inability of the body to  breakdown and eliminate their pain medication, which will result in effects similar to an "overdose", but with the same medicine and dose that they had always taken. This again may lead to disorientation, cognitive impairment, impaired reflexes, respiratory depression or an untoward event leading to serious bodily harm or injury, including death.  A similar problem will occur with patients as they grow older and their liver and kidney function begins to decrease as part of the aging process.  Background information: Historically, the original case for using long-term opioid therapy to treat chronic noncancer pain was based on safety assumptions that subsequent experience has called into question. In 1996, the American Pain Society and the American Academy of Pain Medicine issued a consensus statement supporting long-term opioid therapy. This statement acknowledged the dangers of opioid prescribing but concluded that the risk for addiction was low; respiratory depression induced by opioids was short-lived, occurred mainly in opioid-naive patients, and was antagonized by pain; tolerance was not a common problem; and efforts to control diversion should not constrain opioid prescribing. This has now proven to be wrong. Experience regarding the risks for opioid addiction, misuse, and overdose in community practice has failed to support these assumptions.  According to the Centers for Disease Control and Prevention, fatal overdoses involving opioid analgesics have increased sharply over the past decade. Currently, more than 96,700 people die from drug overdoses every year. Opioids are a factor in 7 out of every 10 overdose deaths. Deaths from drug overdose have surpassed motor vehicle accidents as the leading cause of death for individuals between the ages of 35 and 54.  Clinical data suggest that neuroendocrine dysfunction may be very common in both men and women, potentially causing hypogonadism, erectile  dysfunction, infertility, decreased libido, osteoporosis, and depression. Recent studies linked higher opioid dose to increased opioid-related mortality. Controlled observational studies reported that long-term opioid therapy may be associated with increased risk for cardiovascular events. Subsequent meta-analysis concluded   that the safety of long-term opioid therapy in elderly patients has not been proven.   Side Effects and adverse reactions: Common side effects: Drowsiness (sedation). Dizziness. Nausea and vomiting. Constipation. Physical dependence -- Dependence often manifests with withdrawal symptoms when opioids are discontinued or decreased. Tolerance -- As you take repeated doses of opioids, you require increased medication to experience the same effect of pain relief. Respiratory depression -- This can occur in healthy people, especially with higher doses. However, people with COPD, asthma or other lung conditions may be even more susceptible to fatal respiratory impairment.  Uncommon side effects: An increased sensitivity to feeling pain and extreme response to pain (hyperalgesia). Chronic use of opioids can lead to this. Delayed gastric emptying (the process by which the contents of your stomach are moved into your small intestine). Muscle rigidity. Immune system and hormonal dysfunction. Quick, involuntary muscle jerks (myoclonus). Arrhythmia. Itchy skin (pruritus). Dry mouth (xerostomia).  Long-term side effects: Chronic constipation. Sleep-disordered breathing (SDB). Increased risk of bone fractures. Hypothalamic-pituitary-adrenal dysregulation. Increased risk of overdose.  RISKS: Respiratory depression and death: Opioids increase the risk of respiratory depression and death.  Drug-to-drug interactions: Opioids are relatively contraindicated in combination with benzodiazepines, sleep inducers, and other central nervous system depressants. Other classes of medications  (i.e.: certain antibiotics and even over-the-counter medications) may also trigger or induce respiratory depression in some patients.  Medical conditions: Patients with pre-existing respiratory problems are at higher risk of respiratory failure and/or depression when in combination with opioid analgesics. Opioids are relatively contraindicated in some medical conditions such as central sleep apnea.   Fractures and Falls:  Opioids increase the risk and incidence of falls. This is of particular importance in elderly patients.  Endocrine System:  Long-term administration is associated with endocrine abnormalities (endocrinopathies). (Also known as Opioid-induced Endocrinopathy) Influences on both the hypothalamic-pituitary-adrenal axis?and the hypothalamic-pituitary-gonadal axis have been demonstrated with consequent hypogonadism and adrenal insufficiency in both sexes. Hypogonadism and decreased levels of dehydroepiandrosterone sulfate have been reported in men and women. Endocrine effects include: Amenorrhoea in women (abnormal absence of menstruation) Reduced libido in both sexes Decreased sexual function Erectile dysfunction in men Hypogonadisms (decreased testicular function with shrinkage of testicles) Infertility Depression and fatigue Loss of muscle mass Anxiety Depression Immune suppression Hyperalgesia Weight gain Anemia Osteoporosis Patients (particularly women of childbearing age) should avoid opioids. There is insufficient evidence to recommend routine monitoring of asymptomatic patients taking opioids in the long-term for hormonal deficiencies.  Immune System: Human studies have demonstrated that opioids have an immunomodulating effect. These effects are mediated via opioid receptors both on immune effector cells and in the central nervous system. Opioids have been demonstrated to have adverse effects on antimicrobial response and anti-tumour surveillance. Buprenorphine has  been demonstrated to have no impact on immune function.  Opioid Induced Hyperalgesia: Human studies have demonstrated that prolonged use of opioids can lead to a state of abnormal pain sensitivity, sometimes called opioid induced hyperalgesia (OIH). Opioid induced hyperalgesia is not usually seen in the absence of tolerance to opioid analgesia. Clinically, hyperalgesia may be diagnosed if the patient on long-term opioid therapy presents with increased pain. This might be qualitatively and anatomically distinct from pain related to disease progression or to breakthrough pain resulting from development of opioid tolerance. Pain associated with hyperalgesia tends to be more diffuse than the pre-existing pain and less defined in quality. Management of opioid induced hyperalgesia requires opioid dose reduction.  Cancer: Chronic opioid therapy has been associated with an increased risk of cancer   among noncancer patients with chronic pain. This association was more evident in chronic strong opioid users. Chronic opioid consumption causes significant pathological changes in the small intestine and colon. Epidemiological studies have found that there is a link between opium dependence and initiation of gastrointestinal cancers. Cancer is the second leading cause of death after cardiovascular disease. Chronic use of opioids can cause multiple conditions such as GERD, immunosuppression and renal damage as well as carcinogenic effects, which are associated with the incidence of cancers.   Mortality: Long-term opioid use has been associated with increased mortality among patients with chronic non-cancer pain (CNCP).  Prescription of long-acting opioids for chronic noncancer pain was associated with a significantly increased risk of all-cause mortality, including deaths from causes other than overdose.  Reference: Von Korff M, Kolodny A, Deyo RA, Chou R. Long-term opioid therapy reconsidered. Ann Intern Med. 2011  Sep 6;155(5):325-8. doi: 10.7326/[id number]. PMID: 21893626; PMCID: PMC3280085. Bedson J, Chen Y, Ashworth J, Hayward RA, Dunn KM, Jordan KP. Risk of adverse events in patients prescribed long-term opioids: A cohort study in the [place] Clinical Practice Research Datalink. Eur J Pain. 2019 May;23(5):908-922. doi: 10.1002/ejp.1357. Epub 2019 Jan 31. PMID: 30620116. Colameco S, Coren JS, Ciervo CA. Continuous opioid treatment for chronic noncancer pain: a time for moderation in prescribing. Postgrad Med. 2009 Jul;121(4):61-6. doi: 10.3810/pgm.2009.07.2032. PMID: 19641271. Chou R, Turner JA, Devine EB, Hansen RN, Sullivan SD, Blazina I, Dana T, Bougatsos C, Deyo RA. The effectiveness and risks of long-term opioid therapy for chronic pain: a systematic review for a National Institutes of Health Pathways to Prevention Workshop. Ann Intern Med. 2015 Feb 17;162(4):276-86. doi: 10.7326/M14-2559. PMID: 25581257. Warner M, Chen LH, Makuc DM. NCHS Data Brief No. 22. [place]: Centers for Disease Control and Prevention; 2009. Sep, Increase in Fatal Poisonings Involving Opioid Analgesics in the [place], 1999-2006. Song IA, Choi HR, Oh TK. Long-term opioid use and mortality in patients with chronic non-cancer pain: Ten-year follow-up study in [place] from 2010 through 2019. EClinicalMedicine. 2022 Jul 18;51:101558. doi: 10.1016/j.eclinm.2022.101558. PMID: 35875817; PMCID: PMC9304910. Huser, W., Schubert, T., Vogelmann, T. et al. All-cause mortality in patients with long-term opioid therapy compared with non-opioid analgesics for chronic non-cancer pain: a database study. BMC Med 18, 162 (2020). https://doi.org/10.1186/[email or web address] Rashidian H, Zendehdel K, Kamangar F, Malekzadeh R, Haghdoost AA. An Ecological Study of the Association between Opiate Use and Incidence of Cancers. Addict Health. 2016 Fall;8(4):252-260. PMID: 28819556; PMCID: PMC5554805.  Our Goal: Our goal is to control your  pain with means other than the use of opioid pain medications.  Our Recommendation: Talk to your physician about coming off of these medications. We can assist you with the tapering down and stopping these medicines. Based on the new information, even if you cannot completely stop the medication, a decrease in the dose may be associated with a lesser risk. Ask for other means of controlling the pain. Decrease or eliminate those factors that significantly contribute to your pain such as smoking, obesity, and a diet heavily tilted towards "inflammatory" nutrients.  Last Updated: 03/11/2023   ____________________________________________________________________________________________     ____________________________________________________________________________________________  Transfer of Pain Medication between Pharmacies  Re: 2023 DEA Clarification on existing regulation  Published on DEA Website: May 04, 2022  Title: Revised Regulation Allows DEA-Registered Pharmacies to Transfer Electronic Prescriptions at a Patient's Request DEA Headquarters Division - Public Information Office  "Patients now have the ability to request their electronic prescription be transferred to another pharmacy without having to go back to their practitioner to initiate the   request. This revised regulation went into effect on Monday, April 30, 2022.     At a patient's request, a DEA-registered retail pharmacy can now transfer an electronic prescription for a controlled substance (schedules II-V) to another DEA-registered retail pharmacy. Prior to this change, patients would have to go through their practitioner to cancel their prescription and have it re-issued to a different pharmacy. The process was taxing and time consuming for both patients and practitioners.    The Drug Enforcement Administration (DEA) published its intent to revise the process for transferring electronic prescriptions on July 22, 2020.  The final rule was published in the federal register on March 29, 2022 and went into effect 30 days later.  Under the final rule, a prescription can only be transferred once between pharmacies, and only if allowed under existing state or other applicable law. The prescription must remain in its electronic form; may not be altered in any way; and the transfer must be communicated directly between two licensed pharmacists. It's important to note, any authorized refills transfer with the original prescription, which means the entire prescription will be filled at the same pharmacy."    REFERENCES: 1. DEA website announcement https://www.dea.gov/stories/2023/2023-05/2022-09-01/revised-regulation-allows-dea-registered-pharmacies-transfer  2. Department of Justice website  https://www.govinfo.gov/content/pkg/FR-2022-03-29/pdf/2023-15847.pdf  3. DEPARTMENT OF JUSTICE Drug Enforcement Administration 21 CFR Part 1306 [Docket No. DEA-637] RIN [id number] "Transfer of Electronic Prescriptions for Schedules II-V Controlled Substances Between Pharmacies for Initial Filling"  ____________________________________________________________________________________________     _______________________________________________________________________  Medication Rules  Purpose: To inform patients, and their family members, of our medication rules and regulations.  Applies to: All patients receiving prescriptions from our practice (written or electronic).  Pharmacy of record: This is the pharmacy where your electronic prescriptions will be sent. Make sure we have the correct one.  Electronic prescriptions: In compliance with the [place] Strengthen Opioid Misuse Prevention (STOP) Act of 2017 (Session Law 2017-74/H243), effective September 03, 2018, all controlled substances must be electronically prescribed. Written prescriptions, faxing, or calling prescriptions to a pharmacy will no longer be  done.  Prescription refills: These will be provided only during in-person appointments. No medications will be renewed without a "face-to-face" evaluation with your provider. Applies to all prescriptions.  NOTE: The following applies primarily to controlled substances (Opioid* Pain Medications).   Type of encounter (visit): For patients receiving controlled substances, face-to-face visits are required. (Not an option and not up to the patient.)  Patient's responsibilities: Pain Pills: Bring all pain pills to every appointment (except for procedure appointments). Pill Bottles: Bring pills in original pharmacy bottle. Bring bottle, even if empty. Always bring the bottle of the most recent fill.  Medication refills: You are responsible for knowing and keeping track of what medications you are taking and when is it that you will need a refill. The day before your appointment: write a list of all prescriptions that need to be refilled. The day of the appointment: give the list to the admitting nurse. Prescriptions will be written only during appointments. No prescriptions will be written on procedure days. If you forget a medication: it will not be "Called in", "Faxed", or "electronically sent". You will need to get another appointment to get these prescribed. No early refills. Do not call asking to have your prescription filled early. Partial  or short prescriptions: Occasionally your pharmacy may not have enough pills to fill your prescription.  NEVER ACCEPT a partial fill or a prescription that is short of the total amount of pills that you were prescribed.    With controlled substances the law allows 72 hours for the pharmacy to complete the prescription.  If the prescription is not completed within 72 hours, the pharmacist will require a new prescription to be written. This means that you will be short on your medicine and we WILL NOT send another prescription to complete your original prescription.   Instead, request the pharmacy to send a carrier to a nearby branch to get enough medication to provide you with your full prescription. Prescription Accuracy: You are responsible for carefully inspecting your prescriptions before leaving our office. Have the discharge nurse carefully go over each prescription with you, before taking them home. Make sure that your name is accurately spelled, that your address is correct. Check the name and dose of your medication to make sure it is accurate. Check the number of pills, and the written instructions to make sure they are clear and accurate. Make sure that you are given enough medication to last until your next medication refill appointment. Taking Medication: Take medication as prescribed. When it comes to controlled substances, taking less pills or less frequently than prescribed is permitted and encouraged. Never take more pills than instructed. Never take the medication more frequently than prescribed.  Inform other Doctors: Always inform, all of your healthcare providers, of all the medications you take. Pain Medication from other Providers: You are not allowed to accept any additional pain medication from any other Doctor or Healthcare provider. There are two exceptions to this rule. (see below) In the event that you require additional pain medication, you are responsible for notifying us, as stated below. Cough Medicine: Often these contain an opioid, such as codeine or hydrocodone. Never accept or take cough medicine containing these opioids if you are already taking an opioid* medication. The combination may cause respiratory failure and death. Medication Agreement: You are responsible for carefully reading and following our Medication Agreement. This must be signed before receiving any prescriptions from our practice. Safely store a copy of your signed Agreement. Violations to the Agreement will result in no further prescriptions. (Additional copies of  our Medication Agreement are available upon request.) Laws, Rules, & Regulations: All patients are expected to follow all Federal and State Laws, Statutes, Rules, & Regulations. Ignorance of the Laws does not constitute a valid excuse.  Illegal drugs and Controlled Substances: The use of illegal substances (including, but not limited to marijuana and its derivatives) and/or the illegal use of any controlled substances is strictly prohibited. Violation of this rule may result in the immediate and permanent discontinuation of any and all prescriptions being written by our practice. The use of any illegal substances is prohibited. Adopted CDC guidelines & recommendations: Target dosing levels will be at or below 60 MME/day. Use of benzodiazepines** is not recommended.  Exceptions: There are only two exceptions to the rule of not receiving pain medications from other Healthcare Providers. Exception #1 (Emergencies): In the event of an emergency (i.e.: accident requiring emergency care), you are allowed to receive additional pain medication. However, you are responsible for: As soon as you are able, call our office [phone], at any time of the day or night, and leave a message stating your name, the date and nature of the emergency, and the name and dose of the medication prescribed. In the event that your call is answered by a member of our staff, make sure to document and save the date, time, and the name of the person that took your information.  Exception #2 (  Planned Surgery): In the event that you are scheduled by another doctor or dentist to have any type of surgery or procedure, you are allowed (for a period no longer than 30 days), to receive additional pain medication, for the acute post-op pain. However, in this case, you are responsible for picking up a copy of our "Post-op Pain Management for Surgeons" handout, and giving it to your surgeon or dentist. This document is available at our office,  and does not require an appointment to obtain it. Simply go to our office during business hours (Monday-Thursday from 8:00 AM to 4:00 PM) (Friday 8:00 AM to 12:00 Noon) or if you have a scheduled appointment with us, prior to your surgery, and ask for it by name. In addition, you are responsible for: calling our office [phone], at any time of the day or night, and leaving a message stating your name, name of your surgeon, type of surgery, and date of procedure or surgery. Failure to comply with your responsibilities may result in termination of therapy involving the controlled substances. Medication Agreement Violation. Following the above rules, including your responsibilities will help you in avoiding a Medication Agreement Violation ("Breaking your Pain Medication Contract").  Consequences:  Not following the above rules may result in permanent discontinuation of medication prescription therapy.  *Opioid medications include: morphine, codeine, oxycodone, oxymorphone, hydrocodone, hydromorphone, meperidine, tramadol, tapentadol, buprenorphine, fentanyl, methadone. **Benzodiazepine medications include: diazepam (Valium), alprazolam (Xanax), clonazepam (Klonopine), lorazepam (Ativan), clorazepate (Tranxene), chlordiazepoxide (Librium), estazolam (Prosom), oxazepam (Serax), temazepam (Restoril), triazolam (Halcion) (Last updated: 06/26/2022) ______________________________________________________________________    ______________________________________________________________________  Medication Recommendations and Reminders  Applies to: All patients receiving prescriptions (written and/or electronic).  Medication Rules & Regulations: You are responsible for reading, knowing, and following our "Medication Rules" document. These exist for your safety and that of others. They are not flexible and neither are we. Dismissing or ignoring them is an act of "non-compliance" that may result in  complete and irreversible termination of such medication therapy. For safety reasons, "non-compliance" will not be tolerated. As with the [place] fundamental legal principle of "ignorance of the law is no defense", we will accept no excuses for not having read and knowing the content of documents provided to you by our practice.  Pharmacy of record:  Definition: This is the pharmacy where your electronic prescriptions will be sent.  We do not endorse any particular pharmacy. It is up to you and your insurance to decide what pharmacy to use.  We do not restrict you in your choice of pharmacy. However, once we write for your prescriptions, we will NOT be re-sending more prescriptions to fix restricted supply problems created by your pharmacy, or your insurance.  The pharmacy listed in the electronic medical record should be the one where you want electronic prescriptions to be sent. If you choose to change pharmacy, simply notify our nursing staff. Changes will be made only during your regular appointments and not over the phone.  Recommendations: Keep all of your pain medications in a safe place, under lock and key, even if you live alone. We will NOT replace lost, stolen, or damaged medication. We do not accept "Police Reports" as proof of medications having been stolen. After you fill your prescription, take 1 week's worth of pills and put them away in a safe place. You should keep a separate, properly labeled bottle for this purpose. The remainder should be kept in the original bottle. Use this as your primary supply, until it runs out.   Once it's gone, then you know that you have 1 week's worth of medicine, and it is time to come in for a prescription refill. If you do this correctly, it is unlikely that you will ever run out of medicine. To make sure that the above recommendation works, it is very important that you make sure your medication refill appointments are scheduled at least 1 week before you  run out of medicine. To do this in an effective manner, make sure that you do not leave the office without scheduling your next medication management appointment. Always ask the nursing staff to show you in your prescription , when your medication will be running out. Then arrange for the receptionist to get you a return appointment, at least 7 days before you run out of medicine. Do not wait until you have 1 or 2 pills left, to come in. This is very poor planning and does not take into consideration that we may need to cancel appointments due to bad weather, sickness, or emergencies affecting our staff. DO NOT ACCEPT A "Partial Fill": If for any reason your pharmacy does not have enough pills/tablets to completely fill or refill your prescription, do not allow for a "partial fill". The law allows the pharmacy to complete that prescription within 72 hours, without requiring a new prescription. If they do not fill the rest of your prescription within those 72 hours, you will need a separate prescription to fill the remaining amount, which we will NOT provide. If the reason for the partial fill is your insurance, you will need to talk to the pharmacist about payment alternatives for the remaining tablets, but again, DO NOT ACCEPT A PARTIAL FILL, unless you can trust your pharmacist to obtain the remainder of the pills within 72 hours.  Prescription refills and/or changes in medication(s):  Prescription refills, and/or changes in dose or medication, will be conducted only during scheduled medication management appointments. (Applies to both, written and electronic prescriptions.) No refills on procedure days. No medication will be changed or started on procedure days. No changes, adjustments, and/or refills will be conducted on a procedure day. Doing so will interfere with the diagnostic portion of the procedure. No phone refills. No medications will be "called into the pharmacy". No Fax refills. No weekend  refills. No Holliday refills. No after hours refills.  Remember:  Business hours are:  Monday to Thursday 8:00 AM to 4:00 PM Provider's Schedule: Decklin Weddington, MD - Appointments are:  Medication management: Monday and Wednesday 8:00 AM to 4:00 PM Procedure day: Tuesday and Thursday 7:30 AM to 4:00 PM Bilal Lateef, MD - Appointments are:  Medication management: Tuesday and Thursday 8:00 AM to 4:00 PM Procedure day: Monday and Wednesday 7:30 AM to 4:00 PM (Last update: 06/26/2022) ______________________________________________________________________   ____________________________________________________________________________________________  Naloxone Nasal Spray  Why am I receiving this medication? [place] STOP ACT requires that all patients taking high dose opioids or at risk of opioids respiratory depression, be prescribed an opioid reversal agent, such as Naloxone (AKA: Narcan).  What is this medication? NALOXONE (nal OX one) treats opioid overdose, which causes slow or shallow breathing, severe drowsiness, or trouble staying awake. Call emergency services after using this medication. You may need additional treatment. Naloxone works by reversing the effects of opioids. It belongs to a group of medications called opioid blockers.  COMMON BRAND NAME(S): Kloxxado, Narcan  What should I tell my care team before I take this medication? They need to know if you have   any of these conditions: Heart disease Substance use disorder An unusual or allergic reaction to naloxone, other medications, foods, dyes, or preservatives Pregnant or trying to get pregnant Breast-feeding  When to use this medication? This medication is to be used for the treatment of respiratory depression (less than 8 breaths per minute) secondary to opioid overdose.   How to use this medication? This medication is for use in the nose. Lay the person on their back. Support their neck with your hand  and allow the head to tilt back before giving the medication. The nasal spray should be given into 1 nostril. After giving the medication, move the person onto their side. Do not remove or test the nasal spray until ready to use. Get emergency medical help right away after giving the first dose of this medication, even if the person wakes up. You should be familiar with how to recognize the signs and symptoms of a narcotic overdose. If more doses are needed, give the additional dose in the other nostril. Talk to your care team about the use of this medication in children. While this medication may be prescribed for children as young as newborns for selected conditions, precautions do apply.  Naloxone Overdosage: If you think you have taken too much of this medicine contact a poison control center or emergency room at once.  NOTE: This medicine is only for you. Do not share this medicine with others.  What if I miss a dose? This does not apply.  What may interact with this medication? This is only used during an emergency. No interactions are expected during emergency use. This list may not describe all possible interactions. Give your health care provider a list of all the medicines, herbs, non-prescription drugs, or dietary supplements you use. Also tell them if you smoke, drink alcohol, or use illegal drugs. Some items may interact with your medicine.  What should I watch for while using this medication? Keep this medication ready for use in the case of an opioid overdose. Make sure that you have the phone number of your care team and local hospital ready. You may need to have additional doses of this medication. Each nasal spray contains a single dose. Some emergencies may require additional doses. After use, bring the treated person to the nearest hospital or call 911. Make sure the treating care team knows that the person has received a dose of this medication. You will receive additional  instructions on what to do during and after use of this medication before an emergency occurs.  What side effects may I notice from receiving this medication? Side effects that you should report to your care team as soon as possible: Allergic reactions--skin rash, itching, hives, swelling of the face, lips, tongue, or throat Side effects that usually do not require medical attention (report these to your care team if they continue or are bothersome): Constipation Dryness or irritation inside the nose Headache Increase in blood pressure Muscle spasms Stuffy nose Toothache This list may not describe all possible side effects. Call your doctor for medical advice about side effects. You may report side effects to FDA at 1-800-FDA-1088.  Where should I keep my medication? Because this is an emergency medication, you should keep it with you at all times.  Keep out of the reach of children and pets. Store between 20 and 25 degrees C (68 and 77 degrees F). Do not freeze. Throw away any unused medication after the expiration date. Keep in original box   until ready to use.  NOTE: This sheet is a summary. It may not cover all possible information. If you have questions about this medicine, talk to your doctor, pharmacist, or health care provider.   2023 Elsevier/Gold Standard (2021-04-28 00:00:00)  ____________________________________________________________________________________________   

## 2023-03-18 ENCOUNTER — Encounter: Payer: Self-pay | Admitting: Pain Medicine

## 2023-03-18 ENCOUNTER — Ambulatory Visit: Payer: Medicare Other | Attending: Pain Medicine | Admitting: Pain Medicine

## 2023-03-18 VITALS — BP 157/77 | HR 73 | Temp 97.2°F | Resp 16 | Ht 62.0 in | Wt 165.0 lb

## 2023-03-18 DIAGNOSIS — G894 Chronic pain syndrome: Secondary | ICD-10-CM | POA: Diagnosis not present

## 2023-03-18 DIAGNOSIS — M79604 Pain in right leg: Secondary | ICD-10-CM | POA: Diagnosis not present

## 2023-03-18 DIAGNOSIS — M79605 Pain in left leg: Secondary | ICD-10-CM | POA: Insufficient documentation

## 2023-03-18 DIAGNOSIS — M25511 Pain in right shoulder: Secondary | ICD-10-CM | POA: Insufficient documentation

## 2023-03-18 DIAGNOSIS — G8929 Other chronic pain: Secondary | ICD-10-CM | POA: Diagnosis present

## 2023-03-18 DIAGNOSIS — M47816 Spondylosis without myelopathy or radiculopathy, lumbar region: Secondary | ICD-10-CM | POA: Insufficient documentation

## 2023-03-18 DIAGNOSIS — M545 Low back pain, unspecified: Secondary | ICD-10-CM | POA: Insufficient documentation

## 2023-03-18 DIAGNOSIS — Z79899 Other long term (current) drug therapy: Secondary | ICD-10-CM | POA: Insufficient documentation

## 2023-03-18 DIAGNOSIS — Z79891 Long term (current) use of opiate analgesic: Secondary | ICD-10-CM | POA: Insufficient documentation

## 2023-03-18 DIAGNOSIS — R0789 Other chest pain: Secondary | ICD-10-CM | POA: Insufficient documentation

## 2023-03-18 MED ORDER — HYDROCODONE-ACETAMINOPHEN 5-325 MG PO TABS: 1.0000 | ORAL_TABLET | Freq: Two times a day (BID) | ORAL | 0 refills | Status: DC | PRN

## 2023-03-18 NOTE — Progress Notes (Signed)
Nursing Pain Medication Assessment:  Safety precautions to be maintained throughout the outpatient stay will include: orient to surroundings, keep bed in low position, maintain call bell within reach at all times, provide assistance with transfer out of bed and ambulation.  Medication Inspection Compliance: Pill count conducted under aseptic conditions, in front of the patient. Neither the pills nor the bottle was removed from the patient's sight at any time. Once count was completed pills were immediately returned to the patient in their original bottle.  Medication: Hydrocodone/APAP Pill/Patch Count:  11 of 60 pills remain Pill/Patch Appearance: Markings consistent with prescribed medication Bottle Appearance: Standard pharmacy container. Clearly labeled. Filled Date: 6 / 10 / 2024 Last Medication intake:  Today

## 2023-04-07 ENCOUNTER — Inpatient Hospital Stay
Admission: EM | Admit: 2023-04-07 | Discharge: 2023-04-09 | DRG: 287 | Disposition: A | Discharge status: Other Acute Inpatient Hospital | Payer: Medicare Other | Attending: Internal Medicine | Admitting: Internal Medicine

## 2023-04-07 ENCOUNTER — Other Ambulatory Visit: Payer: Self-pay

## 2023-04-07 ENCOUNTER — Emergency Department: Payer: Medicare Other

## 2023-04-07 DIAGNOSIS — I25112 Atherosclerotic heart disease of native coronary artery with refractory angina pectoris: Secondary | ICD-10-CM | POA: Diagnosis not present

## 2023-04-07 DIAGNOSIS — I429 Cardiomyopathy, unspecified: Secondary | ICD-10-CM | POA: Diagnosis present

## 2023-04-07 DIAGNOSIS — Z794 Long term (current) use of insulin: Secondary | ICD-10-CM

## 2023-04-07 DIAGNOSIS — F10239 Alcohol dependence with withdrawal, unspecified: Secondary | ICD-10-CM | POA: Diagnosis present

## 2023-04-07 DIAGNOSIS — Z79899 Other long term (current) drug therapy: Secondary | ICD-10-CM

## 2023-04-07 DIAGNOSIS — G4733 Obstructive sleep apnea (adult) (pediatric): Secondary | ICD-10-CM | POA: Diagnosis present

## 2023-04-07 DIAGNOSIS — E1151 Type 2 diabetes mellitus with diabetic peripheral angiopathy without gangrene: Secondary | ICD-10-CM | POA: Diagnosis present

## 2023-04-07 DIAGNOSIS — I25700 Atherosclerosis of coronary artery bypass graft(s), unspecified, with unstable angina pectoris: Secondary | ICD-10-CM | POA: Diagnosis present

## 2023-04-07 DIAGNOSIS — E1169 Type 2 diabetes mellitus with other specified complication: Secondary | ICD-10-CM

## 2023-04-07 DIAGNOSIS — I25702 Atherosclerosis of coronary artery bypass graft(s), unspecified, with refractory angina pectoris: Secondary | ICD-10-CM | POA: Diagnosis present

## 2023-04-07 DIAGNOSIS — E1122 Type 2 diabetes mellitus with diabetic chronic kidney disease: Secondary | ICD-10-CM | POA: Diagnosis present

## 2023-04-07 DIAGNOSIS — N1831 Chronic kidney disease, stage 3a: Secondary | ICD-10-CM | POA: Diagnosis present

## 2023-04-07 DIAGNOSIS — I4510 Unspecified right bundle-branch block: Secondary | ICD-10-CM | POA: Diagnosis present

## 2023-04-07 DIAGNOSIS — E785 Hyperlipidemia, unspecified: Secondary | ICD-10-CM | POA: Diagnosis present

## 2023-04-07 DIAGNOSIS — Z8719 Personal history of other diseases of the digestive system: Secondary | ICD-10-CM

## 2023-04-07 DIAGNOSIS — Z9861 Coronary angioplasty status: Secondary | ICD-10-CM

## 2023-04-07 DIAGNOSIS — R079 Chest pain, unspecified: Secondary | ICD-10-CM

## 2023-04-07 DIAGNOSIS — I350 Nonrheumatic aortic (valve) stenosis: Secondary | ICD-10-CM | POA: Diagnosis not present

## 2023-04-07 DIAGNOSIS — Z955 Presence of coronary angioplasty implant and graft: Secondary | ICD-10-CM

## 2023-04-07 DIAGNOSIS — N183 Chronic kidney disease, stage 3 unspecified: Secondary | ICD-10-CM | POA: Diagnosis present

## 2023-04-07 DIAGNOSIS — E782 Mixed hyperlipidemia: Secondary | ICD-10-CM | POA: Diagnosis present

## 2023-04-07 DIAGNOSIS — Z79891 Long term (current) use of opiate analgesic: Secondary | ICD-10-CM

## 2023-04-07 DIAGNOSIS — R0789 Other chest pain: Secondary | ICD-10-CM | POA: Diagnosis not present

## 2023-04-07 DIAGNOSIS — Z8249 Family history of ischemic heart disease and other diseases of the circulatory system: Secondary | ICD-10-CM

## 2023-04-07 DIAGNOSIS — Z87891 Personal history of nicotine dependence: Secondary | ICD-10-CM

## 2023-04-07 DIAGNOSIS — I2582 Chronic total occlusion of coronary artery: Secondary | ICD-10-CM | POA: Diagnosis present

## 2023-04-07 DIAGNOSIS — Z95820 Peripheral vascular angioplasty status with implants and grafts: Secondary | ICD-10-CM

## 2023-04-07 DIAGNOSIS — I25110 Atherosclerotic heart disease of native coronary artery with unstable angina pectoris: Secondary | ICD-10-CM | POA: Diagnosis present

## 2023-04-07 DIAGNOSIS — Z7902 Long term (current) use of antithrombotics/antiplatelets: Secondary | ICD-10-CM

## 2023-04-07 DIAGNOSIS — Z7985 Long-term (current) use of injectable non-insulin antidiabetic drugs: Secondary | ICD-10-CM

## 2023-04-07 DIAGNOSIS — Z951 Presence of aortocoronary bypass graft: Secondary | ICD-10-CM | POA: Diagnosis not present

## 2023-04-07 DIAGNOSIS — G8929 Other chronic pain: Secondary | ICD-10-CM

## 2023-04-07 DIAGNOSIS — I2510 Atherosclerotic heart disease of native coronary artery without angina pectoris: Secondary | ICD-10-CM | POA: Diagnosis not present

## 2023-04-07 DIAGNOSIS — I739 Peripheral vascular disease, unspecified: Secondary | ICD-10-CM

## 2023-04-07 DIAGNOSIS — J449 Chronic obstructive pulmonary disease, unspecified: Secondary | ICD-10-CM | POA: Diagnosis present

## 2023-04-07 DIAGNOSIS — I10 Essential (primary) hypertension: Secondary | ICD-10-CM | POA: Diagnosis present

## 2023-04-07 DIAGNOSIS — I200 Unstable angina: Secondary | ICD-10-CM

## 2023-04-07 DIAGNOSIS — Z86010 Personal history of colonic polyps: Secondary | ICD-10-CM

## 2023-04-07 DIAGNOSIS — F10939 Alcohol use, unspecified with withdrawal, unspecified: Secondary | ICD-10-CM | POA: Diagnosis present

## 2023-04-07 DIAGNOSIS — I129 Hypertensive chronic kidney disease with stage 1 through stage 4 chronic kidney disease, or unspecified chronic kidney disease: Secondary | ICD-10-CM | POA: Diagnosis present

## 2023-04-07 DIAGNOSIS — Z7982 Long term (current) use of aspirin: Secondary | ICD-10-CM

## 2023-04-07 DIAGNOSIS — F1010 Alcohol abuse, uncomplicated: Secondary | ICD-10-CM

## 2023-04-07 HISTORY — DX: Nonrheumatic aortic (valve) stenosis: I35.0

## 2023-04-07 LAB — COMPREHENSIVE METABOLIC PANEL
ALT: 65 U/L — ABNORMAL HIGH (ref 0–44)
AST: 53 U/L — ABNORMAL HIGH (ref 15–41)
Albumin: 4.3 g/dL (ref 3.5–5.0)
Alkaline Phosphatase: 85 U/L (ref 38–126)
Anion gap: 11 (ref 5–15)
BUN: 29 mg/dL — ABNORMAL HIGH (ref 8–23)
CO2: 22 mmol/L (ref 22–32)
Calcium: 9 mg/dL (ref 8.9–10.3)
Chloride: 104 mmol/L (ref 98–111)
Creatinine, Ser: 0.96 mg/dL (ref 0.61–1.24)
GFR, Estimated: >60 mL/min (ref >60)
Glucose, Bld: 112 mg/dL — ABNORMAL HIGH (ref 70–99)
Potassium: 4 mmol/L (ref 3.5–5.1)
Sodium: 137 mmol/L (ref 135–145)
Total Bilirubin: 0.7 mg/dL (ref 0.3–1.2)
Total Protein: 7.9 g/dL (ref 6.5–8.1)

## 2023-04-07 LAB — CBC WITH DIFFERENTIAL/PLATELET
Abs Immature Granulocytes: 0.01 10*3/uL (ref 0.00–0.07)
Basophils Absolute: 0 10*3/uL (ref 0.0–0.1)
Basophils Relative: 0 %
Eosinophils Absolute: 0.2 10*3/uL (ref 0.0–0.5)
Eosinophils Relative: 2 %
HCT: 40.1 % (ref 39.0–52.0)
Hemoglobin: 13.9 g/dL (ref 13.0–17.0)
Immature Granulocytes: 0 %
Lymphocytes Relative: 26 %
Lymphs Abs: 1.8 10*3/uL (ref 0.7–4.0)
MCH: 30.3 pg (ref 26.0–34.0)
MCHC: 34.7 g/dL (ref 30.0–36.0)
MCV: 87.6 fL (ref 80.0–100.0)
Monocytes Absolute: 0.6 10*3/uL (ref 0.1–1.0)
Monocytes Relative: 9 %
Neutro Abs: 4.2 10*3/uL (ref 1.7–7.7)
Neutrophils Relative %: 63 %
Platelets: 157 10*3/uL (ref 150–400)
RBC: 4.58 MIL/uL (ref 4.22–5.81)
RDW: 12.5 % (ref 11.5–15.5)
WBC: 6.7 10*3/uL (ref 4.0–10.5)
nRBC: 0 % (ref 0.0–0.2)

## 2023-04-07 LAB — TROPONIN I (HIGH SENSITIVITY)
Troponin I (High Sensitivity): 26 ng/L — ABNORMAL HIGH (ref <18)
Troponin I (High Sensitivity): 52 ng/L — ABNORMAL HIGH (ref <18)
Troponin I (High Sensitivity): 73 ng/L — ABNORMAL HIGH (ref <18)
Troponin I (High Sensitivity): 139 ng/L (ref <18)

## 2023-04-07 LAB — HEPARIN LEVEL (UNFRACTIONATED)
Heparin Unfractionated: 0.24 IU/mL — ABNORMAL LOW (ref 0.30–0.70)
Heparin Unfractionated: 0.18 IU/mL — ABNORMAL LOW (ref 0.30–0.70)

## 2023-04-07 LAB — CBG MONITORING, ED
Glucose-Capillary: 87 mg/dL (ref 70–99)
Glucose-Capillary: 104 mg/dL — ABNORMAL HIGH (ref 70–99)

## 2023-04-07 LAB — PROTIME-INR
INR: 1.1 (ref 0.8–1.2)
Prothrombin Time: 14.7 seconds (ref 11.4–15.2)

## 2023-04-07 LAB — APTT: aPTT: 36 s (ref 24–36)

## 2023-04-07 LAB — BRAIN NATRIURETIC PEPTIDE: B Natriuretic Peptide: 228.3 pg/mL — ABNORMAL HIGH (ref 0.0–100.0)

## 2023-04-07 LAB — ETHANOL: Alcohol, Ethyl (B): <10 mg/dL (ref <10)

## 2023-04-07 MED ORDER — GABAPENTIN 300 MG PO CAPS
600.0000 mg | ORAL_CAPSULE | Freq: Two times a day (BID) | ORAL | Status: DC
  Administered 2023-04-07 – 2023-04-09 (×5): 600 mg via ORAL
  Filled 2023-04-07 (×5): qty 2

## 2023-04-07 MED ORDER — HEPARIN (PORCINE) 25000 UT/250ML-% IV SOLN
1200.0000 [IU]/h | INTRAVENOUS | Status: DC
  Administered 2023-04-07: 850 [IU]/h via INTRAVENOUS
  Administered 2023-04-08: 1200 [IU]/h via INTRAVENOUS
  Filled 2023-04-07 (×2): qty 250

## 2023-04-07 MED ORDER — MORPHINE SULFATE (PF) 4 MG/ML IV SOLN
4.0000 mg | Freq: Once | INTRAVENOUS | Status: AC
  Administered 2023-04-07: 4 mg via INTRAVENOUS
  Filled 2023-04-07: qty 1

## 2023-04-07 MED ORDER — HEPARIN BOLUS VIA INFUSION
2100.0000 [IU] | Freq: Once | INTRAVENOUS | Status: AC
  Administered 2023-04-07: 2100 [IU] via INTRAVENOUS
  Filled 2023-04-07: qty 2100

## 2023-04-07 MED ORDER — IRBESARTAN 150 MG PO TABS
150.0000 mg | ORAL_TABLET | Freq: Every day | ORAL | Status: DC
  Administered 2023-04-07 – 2023-04-09 (×3): 150 mg via ORAL
  Filled 2023-04-07 (×3): qty 1

## 2023-04-07 MED ORDER — ONDANSETRON HCL 4 MG/2ML IJ SOLN: 4.0000 mg | Freq: Four times a day (QID) | INTRAMUSCULAR | Status: DC | PRN

## 2023-04-07 MED ORDER — ACETAMINOPHEN 325 MG PO TABS: 650.0000 mg | ORAL_TABLET | ORAL | Status: DC | PRN

## 2023-04-07 MED ORDER — HEPARIN SODIUM (PORCINE) 5000 UNIT/ML IJ SOLN: 4000.0000 [IU] | Freq: Once | INTRAMUSCULAR | Status: DC

## 2023-04-07 MED ORDER — NITROGLYCERIN 2 % TD OINT
1.0000 [in_us] | TOPICAL_OINTMENT | Freq: Once | TRANSDERMAL | Status: AC
  Administered 2023-04-07: 1 [in_us] via TOPICAL
  Filled 2023-04-07: qty 1

## 2023-04-07 MED ORDER — METOPROLOL TARTRATE 50 MG PO TABS
50.0000 mg | ORAL_TABLET | Freq: Two times a day (BID) | ORAL | Status: DC
  Administered 2023-04-07 – 2023-04-09 (×5): 50 mg via ORAL
  Filled 2023-04-07 (×3): qty 1
  Filled 2023-04-07: qty 2
  Filled 2023-04-07: qty 1

## 2023-04-07 MED ORDER — THIAMINE HCL 100 MG/ML IJ SOLN
100.0000 mg | Freq: Every day | INTRAMUSCULAR | Status: DC
  Filled 2023-04-07: qty 2

## 2023-04-07 MED ORDER — ONDANSETRON HCL 4 MG/2ML IJ SOLN
4.0000 mg | Freq: Once | INTRAMUSCULAR | Status: AC
  Administered 2023-04-07: 4 mg via INTRAVENOUS
  Filled 2023-04-07: qty 2

## 2023-04-07 MED ORDER — LORAZEPAM 1 MG PO TABS: 1.0000 mg | ORAL_TABLET | ORAL | Status: DC | PRN

## 2023-04-07 MED ORDER — ADULT MULTIVITAMIN W/MINERALS CH
1.0000 | ORAL_TABLET | Freq: Every day | ORAL | Status: DC
  Administered 2023-04-07 – 2023-04-09 (×2): 1 via ORAL
  Filled 2023-04-07 (×2): qty 1

## 2023-04-07 MED ORDER — HEPARIN BOLUS VIA INFUSION
4000.0000 [IU] | Freq: Once | INTRAVENOUS | Status: AC
  Administered 2023-04-07: 4000 [IU] via INTRAVENOUS
  Filled 2023-04-07: qty 4000

## 2023-04-07 MED ORDER — LORAZEPAM 0.5 MG PO TABS: 0.5000 mg | ORAL_TABLET | ORAL | Status: DC | PRN

## 2023-04-07 MED ORDER — ASPIRIN 81 MG PO TBEC
81.0000 mg | DELAYED_RELEASE_TABLET | Freq: Every day | ORAL | Status: DC
  Administered 2023-04-07: 81 mg via ORAL
  Filled 2023-04-07: qty 1

## 2023-04-07 MED ORDER — CLOPIDOGREL BISULFATE 75 MG PO TABS
75.0000 mg | ORAL_TABLET | Freq: Every day | ORAL | Status: DC
  Administered 2023-04-07 – 2023-04-09 (×3): 75 mg via ORAL
  Filled 2023-04-07 (×3): qty 1

## 2023-04-07 MED ORDER — HYDROCODONE-ACETAMINOPHEN 5-325 MG PO TABS
1.0000 | ORAL_TABLET | Freq: Two times a day (BID) | ORAL | Status: DC | PRN
  Administered 2023-04-08: 1 via ORAL
  Filled 2023-04-07: qty 1

## 2023-04-07 MED ORDER — NITROGLYCERIN 0.4 MG SL SUBL
0.4000 mg | SUBLINGUAL_TABLET | SUBLINGUAL | Status: DC | PRN
  Administered 2023-04-08 (×2): 0.4 mg via SUBLINGUAL
  Filled 2023-04-07: qty 1

## 2023-04-07 MED ORDER — FOLIC ACID 1 MG PO TABS
1.0000 mg | ORAL_TABLET | Freq: Every day | ORAL | Status: DC
  Administered 2023-04-07 – 2023-04-09 (×2): 1 mg via ORAL
  Filled 2023-04-07 (×2): qty 1

## 2023-04-07 MED ORDER — THIAMINE MONONITRATE 100 MG PO TABS
100.0000 mg | ORAL_TABLET | Freq: Every day | ORAL | Status: DC
  Administered 2023-04-07 – 2023-04-09 (×2): 100 mg via ORAL
  Filled 2023-04-07 (×2): qty 1

## 2023-04-07 MED ORDER — HEPARIN (PORCINE) 25000 UT/250ML-% IV SOLN
14.0000 [IU]/kg/h | INTRAVENOUS | Status: DC
Start: 2023-04-07 — End: 2023-04-07

## 2023-04-07 MED ORDER — ALLOPURINOL 100 MG PO TABS
100.0000 mg | ORAL_TABLET | Freq: Every day | ORAL | Status: DC
  Administered 2023-04-07 – 2023-04-09 (×3): 100 mg via ORAL
  Filled 2023-04-07 (×3): qty 1

## 2023-04-07 MED ORDER — MORPHINE SULFATE (PF) 2 MG/ML IV SOLN
2.0000 mg | INTRAVENOUS | Status: DC | PRN
  Administered 2023-04-07 – 2023-04-08 (×5): 2 mg via INTRAVENOUS
  Filled 2023-04-07 (×5): qty 1

## 2023-04-07 MED ORDER — INSULIN ASPART 100 UNIT/ML IJ SOLN
0.0000 [IU] | Freq: Three times a day (TID) | INTRAMUSCULAR | Status: DC
  Administered 2023-04-09: 3 [IU] via SUBCUTANEOUS
  Filled 2023-04-07: qty 1

## 2023-04-07 MED ORDER — ATORVASTATIN CALCIUM 20 MG PO TABS
40.0000 mg | ORAL_TABLET | Freq: Every day | ORAL | Status: DC
  Administered 2023-04-07 – 2023-04-08 (×2): 40 mg via ORAL
  Filled 2023-04-07 (×2): qty 2

## 2023-04-07 MED ORDER — HEPARIN BOLUS VIA INFUSION
1000.0000 [IU] | Freq: Once | INTRAVENOUS | Status: AC
  Administered 2023-04-07: 1000 [IU] via INTRAVENOUS
  Filled 2023-04-07: qty 1000

## 2023-04-07 MED ORDER — ONDANSETRON HCL 4 MG/2ML IJ SOLN: 4.0000 mg | Freq: Three times a day (TID) | INTRAMUSCULAR | Status: DC | PRN

## 2023-04-07 MED ORDER — SODIUM CHLORIDE 0.9% FLUSH
3.0000 mL | Freq: Two times a day (BID) | INTRAVENOUS | Status: DC
  Administered 2023-04-07 – 2023-04-08 (×4): 3 mL via INTRAVENOUS

## 2023-04-07 MED ORDER — NITROGLYCERIN 2 % TD OINT
1.0000 [in_us] | TOPICAL_OINTMENT | Freq: Four times a day (QID) | TRANSDERMAL | Status: DC
  Administered 2023-04-07 – 2023-04-08 (×4): 1 [in_us] via TOPICAL
  Filled 2023-04-07 (×5): qty 1

## 2023-04-07 NOTE — Progress Notes (Signed)
ANTICOAGULATION CONSULT NOTE - Initial Consult  Pharmacy Consult for Heparin  Indication: chest pain/ACS  No Known Allergies  Patient Measurements: Height: 5\' 2"  (157.5 cm) Weight: 77.7 kg (171 lb 4.8 oz) IBW/kg (Calculated) : 54.6 Heparin Dosing Weight: 71.1 kg   Vital Signs: Temp: 98.1 F (36.7 C) (08/04 0527) BP: 191/94 (08/04 0630) Pulse Rate: 89 (08/04 0630)  Labs: Recent Labs    04/07/23 0530  HGB 13.9  HCT 40.1  PLT 157  CREATININE 0.96  TROPONINIHS 26*    Estimated Creatinine Clearance: 65.5 mL/min (by C-G formula based on SCr of 0.96 mg/dL).   Medical History: Past Medical History:  Diagnosis Date   Aortic stenosis    Benign esophageal stricture    Cardiomyopathy, secondary (HCC)    Coronary artery disease    Diabetes mellitus without complication (HCC)    Diverticulitis    GI bleed    Gout    Hemorrhoids    Hyperlipidemia    Hypertension    Sleep apnea    Tubular adenoma of colon     Medications:  (Not in a hospital admission)   Assessment: Pharmacy consulted to dose heparin in this 67 year old male admitted with ACS/NSTEMI.  No prior anticoag noted. CrCl = 65.5 ml/min   Goal of Therapy:  Heparin level 0.3-0.7 units/ml Monitor platelets by anticoagulation protocol: Yes   Plan:  Give 4000 units bolus x 1 Start heparin infusion at 850 units/hr Check anti-Xa level in 6 hours and daily while on heparin Continue to monitor H&H and platelets  , D 04/07/2023,6:53 AM

## 2023-04-07 NOTE — ED Notes (Signed)
Admitting at bedside 

## 2023-04-07 NOTE — Assessment & Plan Note (Signed)
Stable from a respiratory standpoint Continue home inhalers 

## 2023-04-07 NOTE — ED Notes (Signed)
Advised nurse that patient has ready bed 

## 2023-04-07 NOTE — ED Triage Notes (Signed)
Patient presents to the ER from Home via [facility] due to chest pain and shortness of breath. Patient sts that he was lying in bed and noticed chest tightness and shortness of breath, but symptoms have been coming and going for the last few days.   EMS gave: 1 sublingual spray of nitroglycerin and a  20 RAC Patient took 2 nitroglycerin tablets and ASA 325 mg.

## 2023-04-07 NOTE — Assessment & Plan Note (Signed)
Patient reports regular hard liquor use Check alcohol level CIWA protocol Follow

## 2023-04-07 NOTE — Progress Notes (Signed)
ANTICOAGULATION CONSULT NOTE -   Pharmacy Consult for Heparin  Indication: chest pain/ACS  No Known Allergies  Patient Measurements: Height: 5\' 3"  (160 cm) Weight: 72.7 kg (160 lb 4.4 oz) IBW/kg (Calculated) : 56.9 Heparin Dosing Weight: 71.1 kg   Vital Signs: Temp: 97.6 F (36.4 C) (08/04 1751) Temp Source: Oral (08/04 1751) BP: 155/74 (08/04 1751) Pulse Rate: 81 (08/04 1751)  Labs: Recent Labs    04/07/23 0530 04/07/23 0750 04/07/23 0943 04/07/23 1220 04/07/23 1302 04/07/23 1955  HGB 13.9  --   --   --   --   --   HCT 40.1  --   --   --   --   --   PLT 157  --   --   --   --   --   APTT 36  --   --   --   --   --   LABPROT 14.7  --   --   --   --   --   INR 1.1  --   --   --   --   --   HEPARINUNFRC  --   --   --   --  0.24* 0.18*  CREATININE 0.96  --   --   --   --   --   TROPONINIHS 26* 52* 73* 139*  --   --     Estimated Creatinine Clearance: 64.9 mL/min (by C-G formula based on SCr of 0.96 mg/dL).   Medical History: Past Medical History:  Diagnosis Date   Aortic stenosis    Benign esophageal stricture    Cardiomyopathy, secondary (HCC)    Coronary artery disease    Diabetes mellitus without complication (HCC)    Diverticulitis    GI bleed    Gout    Hemorrhoids    Hyperlipidemia    Hypertension    Sleep apnea    Tubular adenoma of colon     Medications:  Medications Prior to Admission  Medication Sig Dispense Refill Last Dose   allopurinol (ZYLOPRIM) 100 MG tablet Take 100 mg by mouth daily.    04/06/2023 at Unknown   aspirin EC 81 MG tablet Take 81 mg by mouth daily. Swallow whole.      atorvastatin (LIPITOR) 40 MG tablet Take 40 mg by mouth at bedtime.    04/06/2023 at 2000   Cholecalciferol 25 MCG (1000 UT) tablet Take 2,000 Units by mouth daily.      clopidogrel (PLAVIX) 75 MG tablet Take 1 tablet (75 mg total) by mouth daily. 30 tablet 11 04/06/2023 at Unknown   gabapentin (NEURONTIN) 600 MG tablet Take 1 tablet (600 mg total) by mouth 2 (two)  times daily. 60 tablet 2 04/06/2023 at 2000   HYDROcodone-acetaminophen (NORCO/VICODIN) 5-325 MG tablet Take 1 tablet by mouth 2 (two) times daily as needed for severe pain. Must last 30 days 60 tablet 0 Unknown at PRN   irbesartan (AVAPRO) 150 MG tablet Take 1 tablet (150 mg total) by mouth daily. 30 tablet 2 04/06/2023 at Unknown   isosorbide mononitrate (IMDUR) 60 MG 24 hr tablet Take 1 tablet (60 mg total) by mouth every morning. 30 tablet 2 04/06/2023 at 0800   metoprolol tartrate (LOPRESSOR) 50 MG tablet Take 1 tablet (50 mg total) by mouth 2 (two) times daily. (Patient taking differently: Take 50 mg by mouth 3 (three) times daily.)   04/06/2023 at 2000   Multiple Vitamin (MULTIVITAMIN WITH MINERALS) TABS tablet Take 1  tablet by mouth every evening.      naloxone (NARCAN) nasal spray 4 mg/0.1 mL Place 1 spray into the nose as needed for up to 365 doses (for opioid-induced respiratory depresssion). In case of emergency (overdose), spray once into each nostril. If no response within 3 minutes, repeat application and call 911. 1 each 0    nitroGLYCERIN (NITROSTAT) 0.4 MG SL tablet Place 0.4 mg under the tongue every 5 (five) minutes x 3 doses as needed for chest pain.    Unknown at PRN   Potassium 99 MG TABS Take 1 tablet by mouth daily.      Semaglutide, 1 MG/DOSE, 4 MG/3ML SOPN Inject 1 mg into the skin every Wednesday.      TRESIBA FLEXTOUCH 100 UNIT/ML FlexTouch Pen Inject 50 Units into the skin 2 (two) times daily.   04/06/2023 at 2000   [START ON 04/20/2023] HYDROcodone-acetaminophen (NORCO/VICODIN) 5-325 MG tablet Take 1 tablet by mouth 2 (two) times daily as needed for severe pain. Must last 30 days 60 tablet 0    [START ON 05/20/2023] HYDROcodone-acetaminophen (NORCO/VICODIN) 5-325 MG tablet Take 1 tablet by mouth 2 (two) times daily as needed for severe pain. Must last 30 days 60 tablet 0     Assessment: Pharmacy consulted to dose heparin in this 67 year old male admitted with ACS/NSTEMI.  No prior  anticoag noted. CrCl = 65.5 ml/min   8/4 1302  HL 0.24 SUBtherapeutic  8/4 1955  HL 0.18 Subtherapeutic  Goal of Therapy:  Heparin level 0.3-0.7 units/ml Monitor platelets by anticoagulation protocol: Yes   Plan:  HL is subtherapeutic Give heparin 2100 units IV x 1 Increase heparin infusion rate to 1200 units/hr Recheck heparin level 6 hours after rate change Daily CBC while on heparin  Barrie Folk 04/07/2023,8:39 PM

## 2023-04-07 NOTE — Assessment & Plan Note (Signed)
CPAP.  

## 2023-04-07 NOTE — ED Notes (Signed)
Patient c/o left jaw and arm pain.

## 2023-04-07 NOTE — Consult Note (Signed)
[facility]  CARDIOLOGY CONSULT NOTE  Patient ID: [name] [name]. MRN: [id number] DOB/AGE: 1956-07-11 67 y.o.  Admit date: 04/07/2023 Referring Physician Alvester Morin Primary Physician [facility] Primary Cardiologist  Reason for Consultation unstable angina  HPI: 67 year old gentleman referred for evaluation of unstable angina.  The patient has known coronary artery disease, status post CABG times 38/20/2013, coronary stents times 10/2012 at [facility], occluded LIMA to LAD 02/03/2016, status post redo CABG with RIMA to LAD 03/30/2016 with patent saphenous vein bypass graft to D1 and PDA.  The patient was recently hospitalized 02/22/2023 for unstable angina.  Lexiscan Myoview revealed persistent inferolateral basal apical defect without significant redistribution consistent with scar.  2D echocardiogram 02/22/2023 revealed normal LV function, with LVEF 50-55%, with mild to moderate aortic stenosis, aortic valve area 1.08 cm, peak gradient 23.8 mmHg, mean gradient 15.3 mmHg.  The patient also has known peripheral vascular disease, status post dense common iliac arteries.  He presents with several day history of intermittent episodes of left-sided chest pain which occur with and without exertion.  CT reveals sinus rhythm with right bundle branch block and nonspecific ST-T wave abnormalities.  Admission laboratories reveal mildly elevated sensitivity troponin (26, 52).  Review of systems complete and found to be negative unless listed above     Past Medical History:  Diagnosis Date   Aortic stenosis    Benign esophageal stricture    Cardiomyopathy, secondary (HCC)    Coronary artery disease    Diabetes mellitus without complication (HCC)    Diverticulitis    GI bleed    Gout    Hemorrhoids    Hyperlipidemia    Hypertension    Sleep apnea    Tubular adenoma of colon     Past Surgical History:  Procedure Laterality Date   cardiac bypass     CARDIAC CATHETERIZATION Left  02/03/2016   Procedure: Left Heart Cath and Coronary Angiography;  Surgeon: Dalia Heading, MD;  Location: [facility] INVASIVE CV LAB;  Service: Cardiovascular;  Laterality: Left;   CARDIAC SURGERY     COLONOSCOPY WITH PROPOFOL N/A 04/29/2015   Procedure: COLONOSCOPY WITH PROPOFOL;  Surgeon: Wallace Cullens, MD;  Location: [facility] ENDOSCOPY;  Service: Gastroenterology;  Laterality: N/A;   COLONOSCOPY WITH PROPOFOL N/A 07/20/2020   Procedure: COLONOSCOPY WITH PROPOFOL;  Surgeon: Toledo, Boykin Nearing, MD;  Location: [facility] ENDOSCOPY;  Service: Gastroenterology;  Laterality: N/A;   CORONARY ANGIOPLASTY WITH STENT PLACEMENT     x2   CORONARY ARTERY BYPASS GRAFT     2013 & 2017   ESOPHAGOGASTRODUODENOSCOPY (EGD) WITH PROPOFOL N/A 04/29/2015   Procedure: ESOPHAGOGASTRODUODENOSCOPY (EGD) WITH PROPOFOL;  Surgeon: Wallace Cullens, MD;  Location: [facility] ENDOSCOPY;  Service: Gastroenterology;  Laterality: N/A;   LOWER EXTREMITY ANGIOGRAPHY Left 11/20/2021   Procedure: Lower Extremity Angiography;  Surgeon: Annice Needy, MD;  Location: [facility] INVASIVE CV LAB;  Service: Cardiovascular;  Laterality: Left;   SEPTOPLASTY      (Not in a hospital admission)  Social History   Socioeconomic History   Marital status: Widowed    Spouse name: Not on file   Number of children: Not on file   Years of education: Not on file   Highest education level: Not on file  Occupational History   Occupation: retired  Tobacco Use   Smoking status: Former    Types: Cigarettes   Smokeless tobacco: Former  Substance and Sexual Activity   Alcohol use: Yes    Alcohol/week: 14.0 standard drinks of alcohol  Types: 14 Shots of liquor per week    Comment: couple drinks daily   Drug use: No   Sexual activity: Not on file  Other Topics Concern   Not on file  Social History Narrative   ED visit around the end of October to have chest pain checked which was benign   Social Determinants of Health   Financial Resource Strain: Not on file  Food  Insecurity: No Food Insecurity (02/23/2023)   Hunger Vital Sign    Worried About Running Out of Food in the Last Year: Never true    Ran Out of Food in the Last Year: Never true  Transportation Needs: No Transportation Needs (02/23/2023)   PRAPARE - Administrator, Civil Service (Medical): No    Lack of Transportation (Non-Medical): No  Physical Activity: Not on file  Stress: Not on file  Social Connections: Not on file  Intimate Partner Violence: Not At Risk (02/23/2023)   Humiliation, Afraid, Rape, and Kick questionnaire    Fear of Current or Ex-Partner: No    Emotionally Abused: No    Physically Abused: No    Sexually Abused: No    Family History  Problem Relation Age of Onset   Heart disease Mother    Hypertension Father       Review of systems complete and found to be negative unless listed above      PHYSICAL EXAM  General: Well developed, well nourished, in no acute distress HEENT:  Normocephalic and atramatic Neck:  No JVD.  Lungs: Clear bilaterally to auscultation and percussion. Heart: HRRR . Normal S1 and S2 without gallops or murmurs.  Abdomen: Bowel sounds are positive, abdomen soft and non-tender  Msk:  Back normal, normal gait. Normal strength and tone for age. Extremities: No clubbing, cyanosis or edema.   Neuro: Alert and oriented X 3. Psych:  Good affect, responds appropriately  Labs:   Lab Results  Component Value Date   WBC 6.7 04/07/2023   HGB 13.9 04/07/2023   HCT 40.1 04/07/2023   MCV 87.6 04/07/2023   PLT 157 04/07/2023    Recent Labs  Lab 04/07/23 0530  NA 137  K 4.0  CL 104  CO2 22  BUN 29*  CREATININE 0.96  CALCIUM 9.0  PROT 7.9  BILITOT 0.7  ALKPHOS 85  ALT 65*  AST 53*  GLUCOSE 112*   Lab Results  Component Value Date   CKTOTAL 82 05/16/2013   CKMB 1.4 05/16/2013   TROPONINI <0.03 06/16/2017    Lab Results  Component Value Date   CHOL 82 04/27/2022   CHOL 99 02/29/2016   CHOL 181 04/18/2012   Lab  Results  Component Value Date   HDL 26 (L) 04/27/2022   HDL 30 (L) 02/29/2016   HDL 28 (L) 04/18/2012   Lab Results  Component Value Date   LDLCALC 26 04/27/2022   LDLCALC 52 02/29/2016   LDLCALC 107 (H) 04/18/2012   Lab Results  Component Value Date   TRIG 151 (H) 04/27/2022   TRIG 83 02/29/2016   TRIG 230 (H) 04/18/2012   Lab Results  Component Value Date   CHOLHDL 3.2 04/27/2022   CHOLHDL 3.3 02/29/2016   No results found for: "LDLDIRECT"    Radiology[facility] Chest Port [facility]  Result Date: 04/07/2023 CLINICAL DATA:  Chest pain EXAM: PORTABLE CHEST 1 VIEW COMPARISON:  02/22/2023 FINDINGS: Cardiomegaly. Prior CABG. Mild central venous prominence. There is no edema, consolidation, effusion, or pneumothorax. IMPRESSION:  No acute finding. Electronically Signed   By: Tiburcio Pea M.D.   On: 04/07/2023 06:05    EKG: Sinus rhythm with right bundle branch block and nonspecific ST-T wave abnormalities  ASSESSMENT AND PLAN:   1.  Unstable angina, recurring left-sided chest pain with typical and atypical features, borderline elevated high-sensitivity troponin (26, 52), and abnormal ECG with sinus rhythm and right bundle branch block with nonspecific ST-T wave abnormalities 2.  CAD, status post CABG times 38/20/2013, status post coronary stents times 10/2012, redo RIMA-LAD with patent saphenous vein bypass graft to D1 and PDA 05/31/2016. 3.  Mild to moderate aortic stenosis, calculated aortic valve area 1.08 cm, peak velocity 2.44 m/s, peak gradient 23.8 mmHg, mean gradient 15.3 mmHg 02/25/2023 4.  Essential hypertension, on irbesartan, and metoprolol to tartrate 5.  Hyperlipidemia, on atorvastatin 6.  Type 2 diabetes, on insulin, and Ozempic 7.  Peripheral vascular disease, status post stents bilateral common iliac arteries 11/20/2021  Recommendations  1.  Agree with current therapy 2.  Continue heparin drip 3.  Add topical nitrates 4.  Proceed with right and left heart cardiac  catheterization 04/08/2023.  The risk, benefits alternatives of cardiac catheterization and possible PCI were explained with the patient and informed written consent was obtained.  Signed: Marcina Millard MD,PhD, [facility] 04/07/2023, 10:11 AM

## 2023-04-07 NOTE — Assessment & Plan Note (Signed)
Continue home Vicodin

## 2023-04-07 NOTE — ED Provider Notes (Signed)
[facility] Provider Note    Event Date/Time   First MD Initiated Contact with Patient 04/07/23 [phone]     (approximate)   History   Chest Pain and Shortness of Breath   HPI  [name] Ing. is a 67 y.o. male brought to the ED via EMS from home with a chief complaint of chest pain.  Patient with a history of CAD status post CABG and stents who reports being awakened with chest discomfort and shortness of breath.  Pain slightly improved after taking nitroglycerin at home.  Denies recent fever/chills, cough, abdominal pain, nausea, vomiting or dizziness.     Past Medical History   Past Medical History:  Diagnosis Date  . Aortic stenosis   . Benign esophageal stricture   . Cardiomyopathy, secondary (HCC)   . Coronary artery disease   . Diabetes mellitus without complication (HCC)   . Diverticulitis   . GI bleed   . Gout   . Hemorrhoids   . Hyperlipidemia   . Hypertension   . Sleep apnea   . Tubular adenoma of colon      Active Problem List   Patient Active Problem List   Diagnosis Date Noted  . PAD S/P bilateral common and iliac artery stents 02/23/2023  . History of esophageal stricture s/p dilatation in 2016 02/23/2023  . AKI (acute kidney injury) (HCC) 02/23/2023  . Hyperglycemia due to diabetes mellitus (HCC) 04/26/2022  . Atherosclerosis of native arteries of extremity with intermittent claudication (HCC) 09/27/2021  . Chronic use of opiate for therapeutic purpose 01/17/2021  . Uncomplicated opioid dependence (HCC) 10/31/2020  . Lower GI bleed 10/09/2020  . Uncontrolled type 2 diabetes mellitus with hypoglycemia, with long-term current use of insulin (HCC) 10/09/2020  . CKD stage 3 secondary to diabetes (HCC) 10/09/2020  . Elevated LFTs 10/09/2020  . CKD stage 2 due to type 2 diabetes mellitus (HCC)   . Pharmacologic therapy 05/04/2019  . Disorder of skeletal system 05/04/2019  . Problems influencing health status 05/04/2019  .  Osteoarthritis of hip (Left) 05/26/2018  . Spondylosis without myelopathy or radiculopathy, lumbosacral region 05/25/2018  . Other specified dorsopathies, sacral and sacrococcygeal region 05/25/2018  . Chronic low back pain (Bilateral) (R>L) w/o sciatica 05/25/2018  . DDD (degenerative disc disease), lumbosacral 05/13/2018  . Lumbar facet arthropathy (Multilevel) (Bilateral) 05/13/2018  . Grade 1 Anterolisthesis of L4 on L5 05/13/2018  . Chronic pain syndrome 08/16/2016  . Pneumonia 05/28/2016  . Pressure injury of skin 05/28/2016  . Opioid-induced constipation (OIC) 05/17/2016  . Hypokalemia 05/01/2016  . 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 Carotid atherosclerosis 04/04/2016  . Hypoalbuminemia 04/04/2016  . Right sided weakness 04/02/2016  . S/P CABG 2013 with 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 artery bypass graft) 03/30/2016  . Chest pain 02/29/2016  . Unstable angina (HCC) 02/24/2016  . Keloid skin disorder 01/17/2016  . Costochondritis 01/17/2016  . Long term current use of opiate analgesic 12/23/2015  . Long term prescription opiate use 12/23/2015  . Vitamin D insufficiency 11/21/2015  . Lumbar facet syndrome (Bilateral) (R>L) 11/21/2015  . Lumbar spondylosis 11/21/2015  . Elevated sedimentation rate 11/01/2015  . Chronic shoulder pain (3ry area of Pain) (Right) 10/25/2015  . Chronic hip pain (Left) 10/25/2015  . History of alcoholism (HCC) 10/25/2015  . Opiate use 10/25/2015  . Encounter for therapeutic drug level monitoring 10/25/2015  . Chronic chest wall pain (1ry area of Pain) (Incisional Midline) (since 04/22/2012) 10/25/2015  . Chronic lower extremity pain (  2ry area of Pain) (Bilateral) (R>L) 10/25/2015  . History of MI (myocardial infarction) (January 2014) 10/25/2015  . Encounter for pain management planning 10/25/2015  . Chronic sacroiliac joint pain (Bilateral) (L>R) 10/25/2015  . Neuropathic pain 10/25/2015  . Neurogenic pain 10/25/2015  . Incisional pain 10/25/2015   . Hyperlipidemia 03/16/2014  . H/O coronary artery bypass surgery 05/12/2012  . Alcohol withdrawal syndrome (HCC) 04/28/2012  . Injury of kidney 04/25/2012  . OSA (obstructive sleep apnea) 04/22/2012  . Essential hypertension 04/22/2012  . Coronary artery disease 04/22/2012  . Apnea, sleep 04/21/2012  . Chronic obstructive pulmonary disease (HCC) 04/21/2012  . COPD (chronic obstructive pulmonary disease) (HCC) 04/21/2012     Past Surgical History   Past Surgical History:  Procedure Laterality Date  . cardiac bypass    . CARDIAC CATHETERIZATION Left 02/03/2016   Procedure: Left Heart Cath and Coronary Angiography;  Surgeon: Dalia Heading, MD;  Location: [facility] INVASIVE CV LAB;  Service: Cardiovascular;  Laterality: Left;  . CARDIAC SURGERY    . COLONOSCOPY WITH PROPOFOL N/A 04/29/2015   Procedure: COLONOSCOPY WITH PROPOFOL;  Surgeon: Wallace Cullens, MD;  Location: [facility] ENDOSCOPY;  Service: Gastroenterology;  Laterality: N/A;  . COLONOSCOPY WITH PROPOFOL N/A 07/20/2020   Procedure: COLONOSCOPY WITH PROPOFOL;  Surgeon: Toledo, Boykin Nearing, MD;  Location: [facility] ENDOSCOPY;  Service: Gastroenterology;  Laterality: N/A;  . CORONARY ANGIOPLASTY WITH STENT PLACEMENT     x2  . CORONARY ARTERY BYPASS GRAFT     2013 & 2017  . ESOPHAGOGASTRODUODENOSCOPY (EGD) WITH PROPOFOL N/A 04/29/2015   Procedure: ESOPHAGOGASTRODUODENOSCOPY (EGD) WITH PROPOFOL;  Surgeon: Wallace Cullens, MD;  Location[facility];  Service: Gastroenterology;  Laterality: N/A;  . LOWER EXTREMITY ANGIOGRAPHY Left 11/20/2021   Procedure: Lower Extremity Angiography;  Surgeon: Annice Needy, MD;  Location: [facility] INVASIVE CV LAB;  Service: Cardiovascular;  Laterality: Left;  . SEPTOPLASTY       Home Medications   Prior to Admission medications   Medication Sig Start Date End Date Taking? Authorizing Provider  allopurinol (ZYLOPRIM) 100 MG tablet Take 100 mg by mouth daily.  12/25/18   [provider]  aspirin EC 81 MG tablet Take  81 mg by mouth daily. Swallow whole.    [provider]  atorvastatin (LIPITOR) 40 MG tablet Take 40 mg by mouth at bedtime.     [provider]  BD PEN NEEDLE NANO 2ND GEN 32G X 4 MM MISC Inject 1 applicator into the skin in the morning and at bedtime. 07/18/21   [provider]  Cholecalciferol 25 MCG (1000 UT) tablet Take 2,000 Units by mouth daily.    [provider]  clopidogrel (PLAVIX) 75 MG tablet Take 1 tablet (75 mg total) by mouth daily. 11/02/22   Georgiana Spinner, NP  gabapentin (NEURONTIN) 600 MG tablet Take 1 tablet (600 mg total) by mouth 2 (two) times daily. 02/25/23 05/26/23  Arnetha Courser, MD  HYDROcodone-acetaminophen (NORCO/VICODIN) 5-325 MG tablet Take 1 tablet by mouth 2 (two) times daily as needed for severe pain. Must last 30 days 03/21/23 04/20/23  Delano Metz, MD  HYDROcodone-acetaminophen (NORCO/VICODIN) 5-325 MG tablet Take 1 tablet by mouth 2 (two) times daily as needed for severe pain. Must last 30 days 04/20/23 05/20/23  Delano Metz, MD  HYDROcodone-acetaminophen (NORCO/VICODIN) 5-325 MG tablet Take 1 tablet by mouth 2 (two) times daily as needed for severe pain. Must last 30 days 05/20/23 06/19/23  Delano Metz, MD  irbesartan (AVAPRO) 150 MG tablet  Take 1 tablet (150 mg total) by mouth daily. 02/26/23   Arnetha Courser, MD  isosorbide mononitrate (IMDUR) 60 MG 24 hr tablet Take 1 tablet (60 mg total) by mouth every morning. 02/26/23   Arnetha Courser, MD  metoprolol tartrate (LOPRESSOR) 50 MG tablet Take 1 tablet (50 mg total) by mouth 2 (two) times daily. Patient taking differently: Take 50 mg by mouth 3 (three) times daily. 10/09/20   Alford Highland, MD  Multiple Vitamin (MULTIVITAMIN WITH MINERALS) TABS tablet Take 1 tablet by mouth every evening.    [provider]  naloxone [facility]) nasal spray 4 mg/0.1 mL Place 1 spray into the nose as needed for up to 365 doses (for opioid-induced respiratory depresssion). In case  of emergency (overdose), spray once into each nostril. If no response within 3 minutes, repeat application and call 911. 08/13/22 08/13/23  Delano Metz, MD  nitroGLYCERIN (NITROSTAT) 0.4 MG SL tablet Place 0.4 mg under the tongue every 5 (five) minutes x 3 doses as needed for chest pain.  03/07/16   [provider]  OZEMPIC, 1 MG/DOSE, 4 MG/3ML SOPN Inject 1 mg into the skin once a week. 04/02/22   [provider]  pantoprazole (PROTONIX) 40 MG tablet Take 1 tablet (40 mg total) by mouth daily. 10/09/20 04/26/22  Alford Highland, MD  Potassium 99 MG TABS Take 1 tablet by mouth daily.    [provider]  TRESIBA FLEXTOUCH 100 UNIT/ML FlexTouch Pen Inject 50-52 Units into the skin 2 (two) times daily. 50 units every morning and 52 units every evening 07/04/22   [provider]  valsartan (DIOVAN) 80 MG tablet Take 80 mg by mouth daily.    [provider]     Allergies  Patient has no known allergies.   Family History   Family History  Problem Relation Age of Onset  . Heart disease Mother   . Hypertension Father      Physical Exam  Triage Vital Signs: ED Triage Vitals  Encounter Vitals Group     BP      Systolic BP Percentile      Diastolic BP Percentile      Pulse      Resp      Temp      Temp src      SpO2      Weight      Height      Head Circumference      Peak Flow      Pain Score      Pain Loc      Pain Education      Exclude from Growth Chart     Updated Vital Signs: BP (!) 179/78   Pulse 89   Temp 98.1 F (36.7 C)   Resp (!) 26   Ht 5\' 2"  (1.575 m)   Wt 77.7 kg   SpO2 98%   BMI 31.33 kg/m    General: Awake, mild distress.  CV:  RRR.  Good peripheral perfusion.  Resp:  Increased effort.  CTAB. Abd:  Nontender.  No distention.  Other:  No pedal edema.   ED Results / Procedures / Treatments  Labs (all labs ordered are listed, but only abnormal results are displayed) Labs Reviewed  COMPREHENSIVE  METABOLIC PANEL - Abnormal; Notable for the following components:      Result Value   Glucose, Bld 112 (*)    BUN 29 (*)    AST 53 (*)    ALT 65 (*)  All other components within normal limits  BRAIN NATRIURETIC PEPTIDE - Abnormal; Notable for the following components:   B Natriuretic Peptide 228.3 (*)    All other components within normal limits  TROPONIN I (HIGH SENSITIVITY) - Abnormal; Notable for the following components:   Troponin I (High Sensitivity) 26 (*)    All other components within normal limits  CBC WITH DIFFERENTIAL/PLATELET     EKG  ED ECG REPORT I, , J, the attending physician, personally viewed and interpreted this ECG.   Date: 04/07/2023  EKG Time: 0529  Rate: 86  Rhythm: normal sinus rhythm  Axis: LAD  Intervals:right bundle branch block  ST&T Change: Nonspecific  ED ECG REPORT I, , J, the attending physician, personally viewed and interpreted this ECG.   Date: 04/07/2023  EKG Time: 0620  Rate: 87  Rhythm: normal sinus rhythm  Axis: LAD  Intervals:right bundle branch block  ST&T Change: Nonspecific No significant change from prior   RADIOLOGY I have independently visualized and interpreted patient's x-ray as well as noted the radiology interpretation:  Chest x-ray: No acute cardiopulmonary process  Official radiology report(s): DG Chest Port 1 View  Result Date: 04/07/2023 CLINICAL DATA:  Chest pain EXAM: PORTABLE CHEST 1 VIEW COMPARISON:  02/22/2023 FINDINGS: Cardiomegaly. Prior CABG. Mild central venous prominence. There is no edema, consolidation, effusion, or pneumothorax. IMPRESSION: No acute finding. Electronically Signed   By: Tiburcio Pea M.D.   On: 04/07/2023 06:05     PROCEDURES:  Critical Care performed: Yes, see critical care procedure note(s)  CRITICAL CARE Performed by: Irean Hong   Total critical care time: 30 minutes  Critical care time was exclusive of separately billable procedures and treating  other patients.  Critical care was necessary to treat or prevent imminent or life-threatening deterioration.  Critical care was time spent personally by me on the following activities: development of treatment plan with patient and/or surrogate as well as nursing, discussions with consultants, evaluation of patient's response to treatment, examination of patient, obtaining history from patient or surrogate, ordering and performing treatments and interventions, ordering and review of laboratory studies, ordering and review of radiographic studies, pulse oximetry and re-evaluation of patient's condition.   Marland Kitchen1-3 Lead EKG Interpretation  Performed by: Irean Hong, MD Authorized by: Irean Hong, MD     Interpretation: normal     ECG rate:  85   ECG rate assessment: normal     Rhythm: sinus rhythm     Ectopy: none     Conduction: normal   Comments:     Placed on cardiac monitor to evaluate for arrhythmias    MEDICATIONS ORDERED IN ED: Medications  nitroGLYCERIN (NITROGLYN) 2 % ointment 1 inch (has no administration in time range)     IMPRESSION / MDM / ASSESSMENT AND PLAN / ED COURSE  I reviewed the triage vital signs and the nursing notes.                             67 year old male presenting with chest pain. Differential diagnosis includes, but is not limited to, ACS, aortic dissection, pulmonary embolism, cardiac tamponade, pneumothorax, pneumonia, pericarditis, myocarditis, GI-related causes including esophagitis/gastritis, and musculoskeletal chest wall pain.   I have personally reviewed patient's records and note his cardiology office visit from 03/05/2023.  I have also noticed he was last hospitalized for non-STEMI 02/22/2023.  Had Myoview done on that hospitalization which demonstrated persistent inferior and lateral defects but  without any reversible ischemia.  He was recommended by cardiology to be managed medically; Imdur was increased and he was switched to  Irbesartan.  Patient's presentation is most consistent with acute presentation with potential threat to life or bodily function.  The patient is on the cardiac monitor to evaluate for evidence of arrhythmia and/or significant heart rate changes.  Will obtain cardiac panel, chest x-ray.  Patient took 4 baby aspirin and several nitroglycerin prior to arrival.  Chest pain currently improved.  Anticipate hospitalization.  Clinical Course as of 04/07/23 0701  Wynelle Link Apr 07, 2023  0607 Updated patient on laboratory results demonstrating mildly elevated troponin.  Concern for unstable angina.  Will apply nitroglycerin paste and consult hospital services for evaluation and admission. [JS]  0622 Patient notes slight twinge of pain upon application of nitroglycerin paste.  EKG unchanged.  Will recycle blood pressure, administer IV morphine if blood pressure can tolerate. [JS]    Clinical Course User Index [JS] Irean Hong, MD     FINAL CLINICAL IMPRESSION(S) / ED DIAGNOSES   Final diagnoses:  Chest pain, unspecified type  Unstable angina (HCC)  Hypertension, unspecified type     Rx / DC Orders   ED Discharge Orders     None        Note:  This document was prepared using Dragon voice recognition software and may include unintentional dictation errors.   Irean Hong, MD 04/08/23 [phone]

## 2023-04-07 NOTE — TOC CM/SW Note (Signed)
CSW added [employer] to the AVS. Layla Barter, [place] TOC-Weekends [phone]

## 2023-04-07 NOTE — Assessment & Plan Note (Addendum)
Continue statin. 

## 2023-04-07 NOTE — ED Notes (Signed)
All belongings placed in pt belonging bag with pt label.

## 2023-04-07 NOTE — H&P (Addendum)
History and Physical    Patient: [name] [name]. ION:[id number] DOB: 1956-05-19 DOA: 04/07/2023 DOS: the patient was seen and examined on 04/07/2023 PCP: Shane Crutch, PA  Patient coming from: Home  Chief Complaint:  Chief Complaint  Patient presents with   Chest Pain   Shortness of Breath   HPI: [name] [name]. is a 67 y.o. male with medical history significant of multiple medical issues including type 2 diabetes, alcohol use, hypertension, OSA on CPAP, diverticular disease, chronic pain, aortic valve stenosis, PAD status post common iliac artery stents, CAD status post CABG as well as PCI with stent angioplasty presenting with high risk chest pain.  Patient reports recurrent chest pain over the past 1 to 2 days.  Chest pain woke him up from sleep earlier today.  Positive mild shortness of breath.  No reported nausea or vomiting.  Chest pain did improve after taking nitroglycerin at home.  No hemiparesis or confusion.  Noted admission June 2024 with similar issues with Myoview that did show some ischemia. Presented to the ER afebrile, hemodynamically stable.  Satting well on room air.  White count 6.7, hemoglobin 14, platelets 157, troponin 26-52, creatinine 0.96, AST 53, ALT 65.  BNP 228.  Chest x-ray stable.  EKG sinus rhythm right bundle branch block. Review of Systems: As mentioned in the history of present illness. All other systems reviewed and are negative. Past Medical History:  Diagnosis Date   Aortic stenosis    Benign esophageal stricture    Cardiomyopathy, secondary (HCC)    Coronary artery disease    Diabetes mellitus without complication (HCC)    Diverticulitis    GI bleed    Gout    Hemorrhoids    Hyperlipidemia    Hypertension    Sleep apnea    Tubular adenoma of colon    Past Surgical History:  Procedure Laterality Date   cardiac bypass     CARDIAC CATHETERIZATION Left 02/03/2016   Procedure: Left Heart Cath and Coronary Angiography;  Surgeon: Dalia Heading,  MD;  Location: [facility] INVASIVE CV LAB;  Service: Cardiovascular;  Laterality: Left;   CARDIAC SURGERY     COLONOSCOPY WITH PROPOFOL N/A 04/29/2015   Procedure: COLONOSCOPY WITH PROPOFOL;  Surgeon: Wallace Cullens, MD;  Location: [facility] ENDOSCOPY;  Service: Gastroenterology;  Laterality: N/A;   COLONOSCOPY WITH PROPOFOL N/A 07/20/2020   Procedure: COLONOSCOPY WITH PROPOFOL;  Surgeon: Toledo, Boykin Nearing, MD;  Location: [facility] ENDOSCOPY;  Service: Gastroenterology;  Laterality: N/A;   CORONARY ANGIOPLASTY WITH STENT PLACEMENT     x2   CORONARY ARTERY BYPASS GRAFT     2013 & 2017   ESOPHAGOGASTRODUODENOSCOPY (EGD) WITH PROPOFOL N/A 04/29/2015   Procedure: ESOPHAGOGASTRODUODENOSCOPY (EGD) WITH PROPOFOL;  Surgeon: Wallace Cullens, MD;  Location: [facility] ENDOSCOPY;  Service: Gastroenterology;  Laterality: N/A;   LOWER EXTREMITY ANGIOGRAPHY Left 11/20/2021   Procedure: Lower Extremity Angiography;  Surgeon: Annice Needy, MD;  Location: [facility] INVASIVE CV LAB;  Service: Cardiovascular;  Laterality: Left;   SEPTOPLASTY     Social History:  reports that he has quit smoking. His smoking use included cigarettes. He has quit using smokeless tobacco. He reports current alcohol use of about 14.0 standard drinks of alcohol per week. He reports that he does not use drugs.  No Known Allergies  Family History  Problem Relation Age of Onset   Heart disease Mother    Hypertension Father     Prior to Admission medications   Medication Sig Start  Date End Date Taking? Authorizing Provider  allopurinol (ZYLOPRIM) 100 MG tablet Take 100 mg by mouth daily.  12/25/18   [provider]  aspirin EC 81 MG tablet Take 81 mg by mouth daily. Swallow whole.    [provider]  atorvastatin (LIPITOR) 40 MG tablet Take 40 mg by mouth at bedtime.     [provider]  BD PEN NEEDLE NANO 2ND GEN 32G X 4 MM MISC Inject 1 applicator into the skin in the morning and at bedtime. 07/18/21   [provider]   Cholecalciferol 25 MCG (1000 UT) tablet Take 2,000 Units by mouth daily.    [provider]  clopidogrel (PLAVIX) 75 MG tablet Take 1 tablet (75 mg total) by mouth daily. 11/02/22   Georgiana Spinner, NP  gabapentin (NEURONTIN) 600 MG tablet Take 1 tablet (600 mg total) by mouth 2 (two) times daily. 02/25/23 05/26/23  Arnetha Courser, MD  HYDROcodone-acetaminophen (NORCO/VICODIN) 5-325 MG tablet Take 1 tablet by mouth 2 (two) times daily as needed for severe pain. Must last 30 days 03/21/23 04/20/23  Delano Metz, MD  HYDROcodone-acetaminophen (NORCO/VICODIN) 5-325 MG tablet Take 1 tablet by mouth 2 (two) times daily as needed for severe pain. Must last 30 days 04/20/23 05/20/23  Delano Metz, MD  HYDROcodone-acetaminophen (NORCO/VICODIN) 5-325 MG tablet Take 1 tablet by mouth 2 (two) times daily as needed for severe pain. Must last 30 days 05/20/23 06/19/23  Delano Metz, MD  irbesartan (AVAPRO) 150 MG tablet Take 1 tablet (150 mg total) by mouth daily. 02/26/23   Arnetha Courser, MD  isosorbide mononitrate (IMDUR) 60 MG 24 hr tablet Take 1 tablet (60 mg total) by mouth every morning. 02/26/23   Arnetha Courser, MD  metoprolol tartrate (LOPRESSOR) 50 MG tablet Take 1 tablet (50 mg total) by mouth 2 (two) times daily. Patient taking differently: Take 50 mg by mouth 3 (three) times daily. 10/09/20   Alford Highland, MD  Multiple Vitamin (MULTIVITAMIN WITH MINERALS) TABS tablet Take 1 tablet by mouth every evening.    [provider]  naloxone [facility]) nasal spray 4 mg/0.1 mL Place 1 spray into the nose as needed for up to 365 doses (for opioid-induced respiratory depresssion). In case of emergency (overdose), spray once into each nostril. If no response within 3 minutes, repeat application and call 911. 08/13/22 08/13/23  Delano Metz, MD  nitroGLYCERIN (NITROSTAT) 0.4 MG SL tablet Place 0.4 mg under the tongue every 5 (five) minutes x 3 doses as needed for chest pain.  03/07/16    [provider]  OZEMPIC, 1 MG/DOSE, 4 MG/3ML SOPN Inject 1 mg into the skin once a week. 04/02/22   [provider]  pantoprazole (PROTONIX) 40 MG tablet Take 1 tablet (40 mg total) by mouth daily. 10/09/20 04/26/22  Alford Highland, MD  Potassium 99 MG TABS Take 1 tablet by mouth daily.    [provider]  TRESIBA FLEXTOUCH 100 UNIT/ML FlexTouch Pen Inject 50-52 Units into the skin 2 (two) times daily. 50 units every morning and 52 units every evening 07/04/22   [provider]  valsartan (DIOVAN) 80 MG tablet Take 80 mg by mouth daily.    [provider]    Physical Exam: Vitals:   04/07/23 0615 04/07/23 0630 04/07/23 0700 04/07/23 0730  BP:  (!) 191/94 (!) 167/79 (!) 158/81  Pulse: 76 89 74 77  Resp: 14 17 16 19   Temp:      SpO2: 100% 100% 100% 99%  Weight:      Height:       Physical Exam Constitutional:      Appearance: He is normal weight.  HENT:     Head: Normocephalic and atraumatic.     Nose: Nose normal.     Mouth/Throat:     Mouth: Mucous membranes are moist.  Eyes:     Pupils: Pupils are equal, round, and reactive to light.  Cardiovascular:     Rate and Rhythm: Normal rate and regular rhythm.     Heart sounds: Murmur heard.  Pulmonary:     Effort: Pulmonary effort is normal.  Abdominal:     General: Abdomen is flat. Bowel sounds are normal.  Musculoskeletal:        General: Normal range of motion.  Skin:    General: Skin is warm.  Neurological:     General: No focal deficit present.     Data Reviewed:  There are no new results to review at this time. DG Chest Port 1 View CLINICAL DATA:  Chest pain  EXAM: PORTABLE CHEST 1 VIEW  COMPARISON:  02/22/2023  FINDINGS: Cardiomegaly. Prior CABG. Mild central venous prominence. There is no edema, consolidation, effusion, or pneumothorax.  IMPRESSION: No acute finding.  Electronically Signed   By: Tiburcio Pea M.D.   On: 04/07/2023 06:05  Lab Results   Component Value Date   WBC 6.7 04/07/2023   HGB 13.9 04/07/2023   HCT 40.1 04/07/2023   MCV 87.6 04/07/2023   PLT 157 04/07/2023   Last metabolic panel Lab Results  Component Value Date   GLUCOSE 112 (H) 04/07/2023   NA 137 04/07/2023   K 4.0 04/07/2023   CL 104 04/07/2023   CO2 22 04/07/2023   BUN 29 (H) 04/07/2023   CREATININE 0.96 04/07/2023   GFRNONAA >60 04/07/2023   CALCIUM 9.0 04/07/2023   PHOS 3.7 10/09/2020   PROT 7.9 04/07/2023   ALBUMIN 4.3 04/07/2023   BILITOT 0.7 04/07/2023   ALKPHOS 85 04/07/2023   AST 53 (H) 04/07/2023   ALT 65 (H) 04/07/2023   ANIONGAP 11 04/07/2023    Assessment and Plan: * Chest pain Patient with moderate to severe chest pain that woke him up from sleep earlier today Noted baseline extensive CADstatus post CABG and PCI  Noted admission June 2024 for similar issues with Myoview showing some ischemia Troponin 26-52 today EKG grossly stable apart from right bundle branch block Heart score 7+ S/p full dose ASA  Started on heparin drip in the ER Nitropaste Will formally consult cardiology for formal recommendations Otherwise monitor  Chronic use of opiate for therapeutic purpose Continue home Vicodin  CKD stage 3 secondary to diabetes (HCC) Creatinine 1 today with GFR in the 60s  COPD (chronic obstructive pulmonary disease) (HCC) Stable from a respiratory standpoint Continue home inhalers.  Alcohol withdrawal syndrome (HCC) Patient reports regular hard liquor use Check alcohol level CIWA protocol Follow   Hyperlipidemia Continue statin  Essential hypertension BP stable Titrate home regimen  OSA (obstructive sleep apnea) CPAP      Advance Care Planning:   Code Status: Full Code   Consults: Cardiology   Family Communication: No family at the bedside   Severity of Illness: The appropriate patient status for this patient is INPATIENT. Inpatient status is judged to be reasonable and necessary in order to  provide the required intensity of service to ensure the patient's safety. The patient's presenting symptoms, physical exam findings, and initial radiographic and laboratory data in the  context of their chronic comorbidities is felt to place them at high risk for further clinical deterioration. Furthermore, it is not anticipated that the patient will be medically stable for discharge from the hospital within 2 midnights of admission.   * I certify that at the point of admission it is my clinical judgment that the patient will require inpatient hospital care spanning beyond 2 midnights from the point of admission due to high intensity of service, high risk for further deterioration and high frequency of surveillance required.*  Author: Floydene Flock, MD 04/07/2023 9:20 AM  For on call review [email or web address].

## 2023-04-07 NOTE — Assessment & Plan Note (Signed)
BP stable Titrate home regimen 

## 2023-04-07 NOTE — Assessment & Plan Note (Signed)
History of CKD stage IIIa, creatinine appears to be at baseline with GFR more than 60. Likely CKD stage II -Monitor renal function -Avoid nephrotoxins

## 2023-04-07 NOTE — ED Notes (Signed)
RN notified of patient's increase in pain. MD notified. See MAR. New EKG obtained by [facility].

## 2023-04-07 NOTE — Progress Notes (Signed)
ANTICOAGULATION CONSULT NOTE -   Pharmacy Consult for Heparin  Indication: chest pain/ACS  No Known Allergies  Patient Measurements: Height: 5\' 2"  (157.5 cm) Weight: 77.7 kg (171 lb 4.8 oz) IBW/kg (Calculated) : 54.6 Heparin Dosing Weight: 71.1 kg   Vital Signs: Temp: 98.3 F (36.8 C) (08/04 1245) Temp Source: Oral (08/04 1245) BP: 145/88 (08/04 1223) Pulse Rate: 80 (08/04 1229)  Labs: Recent Labs    04/07/23 0530 04/07/23 0750 04/07/23 0943 04/07/23 1220 04/07/23 1302  HGB 13.9  --   --   --   --   HCT 40.1  --   --   --   --   PLT 157  --   --   --   --   APTT 36  --   --   --   --   LABPROT 14.7  --   --   --   --   INR 1.1  --   --   --   --   HEPARINUNFRC  --   --   --   --  0.24*  CREATININE 0.96  --   --   --   --   TROPONINIHS 26* 52* 73* 139*  --     Estimated Creatinine Clearance: 65.5 mL/min (by C-G formula based on SCr of 0.96 mg/dL).   Medical History: Past Medical History:  Diagnosis Date   Aortic stenosis    Benign esophageal stricture    Cardiomyopathy, secondary (HCC)    Coronary artery disease    Diabetes mellitus without complication (HCC)    Diverticulitis    GI bleed    Gout    Hemorrhoids    Hyperlipidemia    Hypertension    Sleep apnea    Tubular adenoma of colon     Medications:  (Not in a hospital admission)   Assessment: Pharmacy consulted to dose heparin in this 67 year old male admitted with ACS/NSTEMI.  No prior anticoag noted. CrCl = 65.5 ml/min   8/4 1302  HL 0.24 SUBtherapeutic   Goal of Therapy:  Heparin level 0.3-0.7 units/ml Monitor platelets by anticoagulation protocol: Yes   Plan:  8/4 1302 HL 0.24   Subtherapeutic  Give 1000 units bolus x 1 Increase heparin infusion to 1000 units/hr Check anti-Xa level in 6 hours after rate change Continue to monitor H&H and platelets daily  , A 04/07/2023,1:34 PM

## 2023-04-07 NOTE — Assessment & Plan Note (Addendum)
Patient with moderate to severe chest pain that woke him up from sleep earlier today Noted baseline extensive CADstatus post CABG and PCI  Noted admission June 2024 for similar issues with Myoview showing some ischemia Troponin 26-52 today EKG grossly stable apart from right bundle branch block Heart score 7+ S/p full dose ASA  Started on heparin drip in the ER Nitropaste Will formally consult cardiology for formal recommendations Otherwise monitor

## 2023-04-08 ENCOUNTER — Encounter
Admission: EM | Disposition: A | Discharge status: Other Acute Inpatient Hospital | Payer: Self-pay | Source: Home / Self Care | Attending: Internal Medicine

## 2023-04-08 DIAGNOSIS — E1122 Type 2 diabetes mellitus with diabetic chronic kidney disease: Secondary | ICD-10-CM | POA: Diagnosis present

## 2023-04-08 DIAGNOSIS — I25702 Atherosclerosis of coronary artery bypass graft(s), unspecified, with refractory angina pectoris: Secondary | ICD-10-CM | POA: Diagnosis present

## 2023-04-08 DIAGNOSIS — I10 Essential (primary) hypertension: Secondary | ICD-10-CM | POA: Diagnosis not present

## 2023-04-08 DIAGNOSIS — E785 Hyperlipidemia, unspecified: Secondary | ICD-10-CM | POA: Diagnosis present

## 2023-04-08 DIAGNOSIS — I202 Refractory angina pectoris: Secondary | ICD-10-CM | POA: Diagnosis not present

## 2023-04-08 DIAGNOSIS — E1151 Type 2 diabetes mellitus with diabetic peripheral angiopathy without gangrene: Secondary | ICD-10-CM | POA: Diagnosis present

## 2023-04-08 DIAGNOSIS — N1831 Chronic kidney disease, stage 3a: Secondary | ICD-10-CM | POA: Diagnosis present

## 2023-04-08 DIAGNOSIS — I200 Unstable angina: Secondary | ICD-10-CM | POA: Diagnosis not present

## 2023-04-08 DIAGNOSIS — G4733 Obstructive sleep apnea (adult) (pediatric): Secondary | ICD-10-CM | POA: Diagnosis present

## 2023-04-08 DIAGNOSIS — I25700 Atherosclerosis of coronary artery bypass graft(s), unspecified, with unstable angina pectoris: Secondary | ICD-10-CM | POA: Diagnosis present

## 2023-04-08 DIAGNOSIS — Z8719 Personal history of other diseases of the digestive system: Secondary | ICD-10-CM | POA: Diagnosis not present

## 2023-04-08 DIAGNOSIS — Z86010 Personal history of colonic polyps: Secondary | ICD-10-CM | POA: Diagnosis not present

## 2023-04-08 DIAGNOSIS — I429 Cardiomyopathy, unspecified: Secondary | ICD-10-CM | POA: Diagnosis present

## 2023-04-08 DIAGNOSIS — Z794 Long term (current) use of insulin: Secondary | ICD-10-CM | POA: Diagnosis not present

## 2023-04-08 DIAGNOSIS — I350 Nonrheumatic aortic (valve) stenosis: Secondary | ICD-10-CM | POA: Diagnosis present

## 2023-04-08 DIAGNOSIS — N183 Chronic kidney disease, stage 3 unspecified: Secondary | ICD-10-CM

## 2023-04-08 DIAGNOSIS — Z955 Presence of coronary angioplasty implant and graft: Secondary | ICD-10-CM | POA: Diagnosis not present

## 2023-04-08 DIAGNOSIS — I129 Hypertensive chronic kidney disease with stage 1 through stage 4 chronic kidney disease, or unspecified chronic kidney disease: Secondary | ICD-10-CM | POA: Diagnosis present

## 2023-04-08 DIAGNOSIS — I4510 Unspecified right bundle-branch block: Secondary | ICD-10-CM | POA: Diagnosis present

## 2023-04-08 DIAGNOSIS — Z95820 Peripheral vascular angioplasty status with implants and grafts: Secondary | ICD-10-CM | POA: Diagnosis not present

## 2023-04-08 DIAGNOSIS — Z87891 Personal history of nicotine dependence: Secondary | ICD-10-CM | POA: Diagnosis not present

## 2023-04-08 DIAGNOSIS — I2582 Chronic total occlusion of coronary artery: Secondary | ICD-10-CM | POA: Diagnosis present

## 2023-04-08 DIAGNOSIS — R0789 Other chest pain: Secondary | ICD-10-CM | POA: Diagnosis present

## 2023-04-08 DIAGNOSIS — F10239 Alcohol dependence with withdrawal, unspecified: Secondary | ICD-10-CM | POA: Diagnosis present

## 2023-04-08 DIAGNOSIS — I25110 Atherosclerotic heart disease of native coronary artery with unstable angina pectoris: Secondary | ICD-10-CM | POA: Diagnosis present

## 2023-04-08 DIAGNOSIS — I25112 Atherosclerotic heart disease of native coronary artery with refractory angina pectoris: Secondary | ICD-10-CM | POA: Diagnosis present

## 2023-04-08 DIAGNOSIS — J449 Chronic obstructive pulmonary disease, unspecified: Secondary | ICD-10-CM | POA: Diagnosis present

## 2023-04-08 DIAGNOSIS — Z7982 Long term (current) use of aspirin: Secondary | ICD-10-CM | POA: Diagnosis not present

## 2023-04-08 DIAGNOSIS — Z8249 Family history of ischemic heart disease and other diseases of the circulatory system: Secondary | ICD-10-CM | POA: Diagnosis not present

## 2023-04-08 DIAGNOSIS — F10930 Alcohol use, unspecified with withdrawal, uncomplicated: Secondary | ICD-10-CM

## 2023-04-08 DIAGNOSIS — R079 Chest pain, unspecified: Secondary | ICD-10-CM | POA: Diagnosis not present

## 2023-04-08 HISTORY — PX: LEFT HEART CATH: CATH118248

## 2023-04-08 LAB — POCT I-STAT 7, (LYTES, BLD GAS, ICA,H+H)
Acid-base deficit: 3 mmol/L — ABNORMAL HIGH (ref 0.0–2.0)
Bicarbonate: 20.5 mmol/L (ref 20.0–28.0)
Calcium, Ion: 1.12 mmol/L — ABNORMAL LOW (ref 1.15–1.40)
HCT: 35 % — ABNORMAL LOW (ref 39.0–52.0)
Hemoglobin: 11.9 g/dL — ABNORMAL LOW (ref 13.0–17.0)
O2 Saturation: 97 %
Potassium: 3.9 mmol/L (ref 3.5–5.1)
Sodium: 140 mmol/L (ref 135–145)
TCO2: 21 mmol/L — ABNORMAL LOW (ref 22–32)
pCO2 arterial: 31.2 mmHg — ABNORMAL LOW (ref 32–48)
pH, Arterial: 7.425 (ref 7.35–7.45)
pO2, Arterial: 84 mmHg (ref 83–108)

## 2023-04-08 LAB — POCT I-STAT EG7
Acid-Base Excess: 0 mmol/L (ref 0.0–2.0)
Bicarbonate: 24.8 mmol/L (ref 20.0–28.0)
Calcium, Ion: 1.25 mmol/L (ref 1.15–1.40)
HCT: 37 % — ABNORMAL LOW (ref 39.0–52.0)
Hemoglobin: 12.6 g/dL — ABNORMAL LOW (ref 13.0–17.0)
O2 Saturation: 66 %
Potassium: 4.2 mmol/L (ref 3.5–5.1)
Sodium: 138 mmol/L (ref 135–145)
TCO2: 26 mmol/L (ref 22–32)
pCO2, Ven: 41.8 mmHg — ABNORMAL LOW (ref 44–60)
pH, Ven: 7.382 (ref 7.25–7.43)
pO2, Ven: 35 mmHg (ref 32–45)

## 2023-04-08 LAB — BASIC METABOLIC PANEL
Anion gap: 9 (ref 5–15)
BUN: 27 mg/dL — ABNORMAL HIGH (ref 8–23)
CO2: 23 mmol/L (ref 22–32)
Calcium: 9 mg/dL (ref 8.9–10.3)
Chloride: 104 mmol/L (ref 98–111)
Creatinine, Ser: 1.07 mg/dL (ref 0.61–1.24)
GFR, Estimated: >60 mL/min (ref >60)
Glucose, Bld: 68 mg/dL — ABNORMAL LOW (ref 70–99)
Potassium: 4.2 mmol/L (ref 3.5–5.1)
Sodium: 136 mmol/L (ref 135–145)

## 2023-04-08 LAB — HEPARIN LEVEL (UNFRACTIONATED): Heparin Unfractionated: 0.43 IU/mL (ref 0.30–0.70)

## 2023-04-08 LAB — GLUCOSE, CAPILLARY
Glucose-Capillary: 73 mg/dL (ref 70–99)
Glucose-Capillary: 91 mg/dL (ref 70–99)
Glucose-Capillary: 66 mg/dL — ABNORMAL LOW (ref 70–99)
Glucose-Capillary: 300 mg/dL — ABNORMAL HIGH (ref 70–99)

## 2023-04-08 SURGERY — LEFT HEART CATH
Anesthesia: Moderate Sedation

## 2023-04-08 MED ORDER — RANOLAZINE ER 500 MG PO TB12
500.0000 mg | ORAL_TABLET | Freq: Two times a day (BID) | ORAL | Status: DC
  Administered 2023-04-08 – 2023-04-09 (×2): 500 mg via ORAL
  Filled 2023-04-08 (×2): qty 1

## 2023-04-08 MED ORDER — ONDANSETRON HCL 4 MG/2ML IJ SOLN: 4.0000 mg | Freq: Four times a day (QID) | INTRAMUSCULAR | Status: DC | PRN

## 2023-04-08 MED ORDER — LIDOCAINE HCL (PF) 1 % IJ SOLN
INTRAMUSCULAR | Status: DC | PRN
  Administered 2023-04-08: 20 mL

## 2023-04-08 MED ORDER — SODIUM CHLORIDE 0.9 % IV SOLN: 250.0000 mL | INTRAVENOUS | Status: DC | PRN

## 2023-04-08 MED ORDER — SODIUM CHLORIDE 0.9% FLUSH: 3.0000 mL | Freq: Two times a day (BID) | INTRAVENOUS | Status: DC

## 2023-04-08 MED ORDER — LABETALOL HCL 5 MG/ML IV SOLN: 10.0000 mg | INTRAVENOUS | Status: AC | PRN

## 2023-04-08 MED ORDER — HEPARIN (PORCINE) IN NACL 1000-0.9 UT/500ML-% IV SOLN
INTRAVENOUS | Status: AC
  Filled 2023-04-08: qty 1000

## 2023-04-08 MED ORDER — LIDOCAINE HCL 1 % IJ SOLN
INTRAMUSCULAR | Status: AC
  Filled 2023-04-08: qty 20

## 2023-04-08 MED ORDER — ASPIRIN 81 MG PO TBEC
81.0000 mg | DELAYED_RELEASE_TABLET | Freq: Every day | ORAL | Status: DC
  Administered 2023-04-09: 81 mg via ORAL
  Filled 2023-04-08: qty 1

## 2023-04-08 MED ORDER — HEPARIN (PORCINE) IN NACL 1000-0.9 UT/500ML-% IV SOLN
INTRAVENOUS | Status: AC
  Filled 2023-04-08: qty 500

## 2023-04-08 MED ORDER — FENTANYL CITRATE (PF) 100 MCG/2ML IJ SOLN
INTRAMUSCULAR | Status: AC
  Filled 2023-04-08: qty 2

## 2023-04-08 MED ORDER — SODIUM CHLORIDE 0.9% FLUSH: 3.0000 mL | INTRAVENOUS | Status: DC | PRN

## 2023-04-08 MED ORDER — SODIUM CHLORIDE 0.9 % WEIGHT BASED INFUSION
1.0000 mL/kg/h | INTRAVENOUS | Status: AC
  Administered 2023-04-08: 1 mL/kg/h via INTRAVENOUS

## 2023-04-08 MED ORDER — ACETAMINOPHEN 325 MG PO TABS: 650.0000 mg | ORAL_TABLET | ORAL | Status: DC | PRN

## 2023-04-08 MED ORDER — HEPARIN (PORCINE) IN NACL 2000-0.9 UNIT/L-% IV SOLN
INTRAVENOUS | Status: DC | PRN
  Administered 2023-04-08: 1000 mL

## 2023-04-08 MED ORDER — FENTANYL CITRATE (PF) 100 MCG/2ML IJ SOLN
INTRAMUSCULAR | Status: DC | PRN
  Administered 2023-04-08 (×2): 25 ug via INTRAVENOUS

## 2023-04-08 MED ORDER — ASPIRIN 81 MG PO CHEW
81.0000 mg | CHEWABLE_TABLET | ORAL | Status: AC
  Administered 2023-04-08: 81 mg via ORAL
  Filled 2023-04-08: qty 1

## 2023-04-08 MED ORDER — HYDRALAZINE HCL 20 MG/ML IJ SOLN: 10.0000 mg | INTRAMUSCULAR | Status: AC | PRN

## 2023-04-08 MED ORDER — MIDAZOLAM HCL 2 MG/2ML IJ SOLN
INTRAMUSCULAR | Status: DC | PRN
  Administered 2023-04-08: 1 mg via INTRAVENOUS
  Administered 2023-04-08: .5 mg via INTRAVENOUS

## 2023-04-08 MED ORDER — SODIUM CHLORIDE 0.9 % WEIGHT BASED INFUSION
1.0000 mL/kg/h | INTRAVENOUS | Status: DC
  Administered 2023-04-08: 1 mL/kg/h via INTRAVENOUS

## 2023-04-08 MED ORDER — MIDAZOLAM HCL 2 MG/2ML IJ SOLN
INTRAMUSCULAR | Status: AC
  Filled 2023-04-08: qty 2

## 2023-04-08 MED ORDER — SODIUM CHLORIDE 0.9 % WEIGHT BASED INFUSION: 3.0000 mL/kg/h | INTRAVENOUS | Status: AC

## 2023-04-08 MED ORDER — NITROGLYCERIN 2 % TD OINT
1.0000 [in_us] | TOPICAL_OINTMENT | Freq: Four times a day (QID) | TRANSDERMAL | Status: DC
  Administered 2023-04-08 – 2023-04-09 (×3): 1 [in_us] via TOPICAL
  Filled 2023-04-08 (×3): qty 1

## 2023-04-08 MED ORDER — IOHEXOL 300 MG/ML  SOLN
INTRAMUSCULAR | Status: DC | PRN
  Administered 2023-04-08: 175 mL

## 2023-04-08 SURGICAL SUPPLY — 16 items
CATH INFINITI 5 FR IM (CATHETERS) IMPLANT
CATH INFINITI 5FR MULTPACK ANG (CATHETERS) IMPLANT
CATH SWAN GANZ 7F STRAIGHT (CATHETERS) IMPLANT
DEVICE CLOSURE MYNXGRIP 6/7F (Vascular Products) IMPLANT
KIT RIGHT HEART ACIST (MISCELLANEOUS) IMPLANT
NDL PERC 18GX7CM (NEEDLE) IMPLANT
NEEDLE PERC 18GX7CM (NEEDLE) ×1 IMPLANT
PACK CARDIAC CATH (CUSTOM PROCEDURE TRAY) ×1 IMPLANT
PROTECTION STATION PRESSURIZED (MISCELLANEOUS) ×1
SET ATX-X65L (MISCELLANEOUS) IMPLANT
SHEATH AVANTI 6FR X 11CM (SHEATH) IMPLANT
SHEATH AVANTI 7FRX11 (SHEATH) IMPLANT
STATION PROTECTION PRESSURIZED (MISCELLANEOUS) IMPLANT
WIRE EMERALD 3MM-J .035X260CM (WIRE) IMPLANT
WIRE EMERALD ST .035X150CM (WIRE) IMPLANT
WIRE GUIDERIGHT .035X150 (WIRE) IMPLANT

## 2023-04-08 NOTE — Hospital Course (Addendum)
Taken from prior notes.   [name] [name]. is a 67 y.o. male with medical history significant of multiple medical issues including type 2 diabetes, alcohol use, hypertension, OSA on CPAP, diverticular disease, chronic pain, aortic valve stenosis, PAD status post common iliac artery stents, CAD status post CABG as well as PCI with stent angioplasty presenting with high risk chest pain.   On presentation hemodynamically stable, labs pertinent for troponin 26-52, creatinine 0.96, AST 53, ALT 65.  BNP 228.  Chest x-ray stable.  EKG sinus rhythm right bundle branch block.   8/5:Cardiology was consulted based on his risk factors and he was taken to Cath Lab, found to have multivessel disease with occluded prior graft, cardiology is recommending continuation of dual antiplatelet therapy, added Ranexa and they will refer him to [facility] for a second opinion about potential complex PCI.  8/6: Vitals and labs stable.  He is being accepted at [facility] per cardiology for a complicated PCI.  Patient is being discharged to [facility] for further management of his refractory angina and recurrent stenosis.

## 2023-04-08 NOTE — Progress Notes (Signed)
ANTICOAGULATION CONSULT NOTE -   Pharmacy Consult for Heparin  Indication: chest pain/ACS  No Known Allergies  Patient Measurements: Height: 5\' 3"  (160 cm) Weight: 72.7 kg (160 lb 4.4 oz) IBW/kg (Calculated) : 56.9 Heparin Dosing Weight: 71.1 kg   Vital Signs: Temp: 97.8 F (36.6 C) (08/05 0415) Temp Source: Oral (08/04 1751) BP: 121/64 (08/05 0415) Pulse Rate: 72 (08/05 0415)  Labs: Recent Labs    04/07/23 0530 04/07/23 0750 04/07/23 0943 04/07/23 1220 04/07/23 1302 04/07/23 1955 04/08/23 0306  HGB 13.9  --   --   --   --   --  13.3  HCT 40.1  --   --   --   --   --  37.6*  PLT 157  --   --   --   --   --  151  APTT 36  --   --   --   --   --   --   LABPROT 14.7  --   --   --   --   --   --   INR 1.1  --   --   --   --   --   --   HEPARINUNFRC  --   --   --   --  0.24* 0.18* 0.52  CREATININE 0.96  --   --   --   --   --   --   TROPONINIHS 26* 52* 73* 139*  --   --   --     Estimated Creatinine Clearance: 64.9 mL/min (by C-G formula based on SCr of 0.96 mg/dL).   Medical History: Past Medical History:  Diagnosis Date   Aortic stenosis    Benign esophageal stricture    Cardiomyopathy, secondary (HCC)    Coronary artery disease    Diabetes mellitus without complication (HCC)    Diverticulitis    GI bleed    Gout    Hemorrhoids    Hyperlipidemia    Hypertension    Sleep apnea    Tubular adenoma of colon     Medications:  Medications Prior to Admission  Medication Sig Dispense Refill Last Dose   allopurinol (ZYLOPRIM) 100 MG tablet Take 100 mg by mouth daily.    04/06/2023 at Unknown   aspirin EC 81 MG tablet Take 81 mg by mouth daily. Swallow whole.      atorvastatin (LIPITOR) 40 MG tablet Take 40 mg by mouth at bedtime.    04/06/2023 at 2000   Cholecalciferol 25 MCG (1000 UT) tablet Take 2,000 Units by mouth daily.      clopidogrel (PLAVIX) 75 MG tablet Take 1 tablet (75 mg total) by mouth daily. 30 tablet 11 04/06/2023 at Unknown   gabapentin (NEURONTIN)  600 MG tablet Take 1 tablet (600 mg total) by mouth 2 (two) times daily. 60 tablet 2 04/06/2023 at 2000   HYDROcodone-acetaminophen (NORCO/VICODIN) 5-325 MG tablet Take 1 tablet by mouth 2 (two) times daily as needed for severe pain. Must last 30 days 60 tablet 0 Unknown at PRN   irbesartan (AVAPRO) 150 MG tablet Take 1 tablet (150 mg total) by mouth daily. 30 tablet 2 04/06/2023 at Unknown   isosorbide mononitrate (IMDUR) 60 MG 24 hr tablet Take 1 tablet (60 mg total) by mouth every morning. 30 tablet 2 04/06/2023 at 0800   metoprolol tartrate (LOPRESSOR) 50 MG tablet Take 1 tablet (50 mg total) by mouth 2 (two) times daily. (Patient taking differently: Take 50 mg by  mouth 3 (three) times daily.)   04/06/2023 at 2000   Multiple Vitamin (MULTIVITAMIN WITH MINERALS) TABS tablet Take 1 tablet by mouth every evening.      naloxone (NARCAN) nasal spray 4 mg/0.1 mL Place 1 spray into the nose as needed for up to 365 doses (for opioid-induced respiratory depresssion). In case of emergency (overdose), spray once into each nostril. If no response within 3 minutes, repeat application and call 911. 1 each 0    nitroGLYCERIN (NITROSTAT) 0.4 MG SL tablet Place 0.4 mg under the tongue every 5 (five) minutes x 3 doses as needed for chest pain.    Unknown at PRN   Potassium 99 MG TABS Take 1 tablet by mouth daily.      Semaglutide, 1 MG/DOSE, 4 MG/3ML SOPN Inject 1 mg into the skin every Wednesday.      TRESIBA FLEXTOUCH 100 UNIT/ML FlexTouch Pen Inject 50 Units into the skin 2 (two) times daily.   04/06/2023 at 2000   [START ON 04/20/2023] HYDROcodone-acetaminophen (NORCO/VICODIN) 5-325 MG tablet Take 1 tablet by mouth 2 (two) times daily as needed for severe pain. Must last 30 days 60 tablet 0    [START ON 05/20/2023] HYDROcodone-acetaminophen (NORCO/VICODIN) 5-325 MG tablet Take 1 tablet by mouth 2 (two) times daily as needed for severe pain. Must last 30 days 60 tablet 0     Assessment: Pharmacy consulted to dose heparin  in this 67 year old male admitted with ACS/NSTEMI.  No prior anticoag noted. CrCl = 65.5 ml/min   8/4 1302  HL 0.24 SUBtherapeutic  8/4 1955  HL 0.18 Subtherapeutic 8/5 0306  HL 0.52       Therapeutic X 1   Goal of Therapy:  Heparin level 0.3-0.7 units/ml Monitor platelets by anticoagulation protocol: Yes   Plan:  8/5:  HL @ 0306 = 0.52, therapeutic X 1  - Will continue pt on current rate and recheck HL in 6 hrs @ 0900.  Daily CBC while on heparin  , D 04/08/2023,4:24 AM

## 2023-04-08 NOTE — Progress Notes (Signed)
ANTICOAGULATION CONSULT NOTE   Pharmacy Consult for Heparin  Indication: chest pain/ACS  No Known Allergies  Patient Measurements: Height: 5\' 3"  (160 cm) Weight: 72.7 kg (160 lb 4.4 oz) IBW/kg (Calculated) : 56.9 Heparin Dosing Weight: 71.1 kg   Vital Signs: Temp: 97.7 F (36.5 C) (08/05 0912) Temp Source: Oral (08/05 0912) BP: 108/59 (08/05 0912) Pulse Rate: 72 (08/05 0912)  Labs: Recent Labs    04/07/23 0530 04/07/23 0750 04/07/23 0943 04/07/23 1220 04/07/23 1302 04/07/23 1955 04/08/23 0306 04/08/23 0909  HGB 13.9  --   --   --   --   --  13.3  --   HCT 40.1  --   --   --   --   --  37.6*  --   PLT 157  --   --   --   --   --  151  --   APTT 36  --   --   --   --   --   --   --   LABPROT 14.7  --   --   --   --   --   --   --   INR 1.1  --   --   --   --   --   --   --   HEPARINUNFRC  --   --   --   --    < > 0.18* 0.52 0.43  CREATININE 0.96  --   --   --   --   --   --  1.07  TROPONINIHS 26* 52* 73* 139*  --   --   --   --    < > = values in this interval not displayed.    Estimated Creatinine Clearance: 58.2 mL/min (by C-G formula based on SCr of 1.07 mg/dL).   Medical History: Past Medical History:  Diagnosis Date   Aortic stenosis    Benign esophageal stricture    Cardiomyopathy, secondary (HCC)    Coronary artery disease    Diabetes mellitus without complication (HCC)    Diverticulitis    GI bleed    Gout    Hemorrhoids    Hyperlipidemia    Hypertension    Sleep apnea    Tubular adenoma of colon     Medications:  Medications Prior to Admission  Medication Sig Dispense Refill Last Dose   allopurinol (ZYLOPRIM) 100 MG tablet Take 100 mg by mouth daily.    04/06/2023 at Unknown   aspirin EC 81 MG tablet Take 81 mg by mouth daily. Swallow whole.      atorvastatin (LIPITOR) 40 MG tablet Take 40 mg by mouth at bedtime.    04/06/2023 at 2000   Cholecalciferol 25 MCG (1000 UT) tablet Take 2,000 Units by mouth daily.      clopidogrel (PLAVIX) 75 MG  tablet Take 1 tablet (75 mg total) by mouth daily. 30 tablet 11 04/06/2023 at Unknown   gabapentin (NEURONTIN) 600 MG tablet Take 1 tablet (600 mg total) by mouth 2 (two) times daily. 60 tablet 2 04/06/2023 at 2000   HYDROcodone-acetaminophen (NORCO/VICODIN) 5-325 MG tablet Take 1 tablet by mouth 2 (two) times daily as needed for severe pain. Must last 30 days 60 tablet 0 Unknown at PRN   irbesartan (AVAPRO) 150 MG tablet Take 1 tablet (150 mg total) by mouth daily. 30 tablet 2 04/06/2023 at Unknown   isosorbide mononitrate (IMDUR) 60 MG 24 hr tablet Take 1 tablet (60 mg  total) by mouth every morning. 30 tablet 2 04/06/2023 at 0800   metoprolol tartrate (LOPRESSOR) 50 MG tablet Take 1 tablet (50 mg total) by mouth 2 (two) times daily. (Patient taking differently: Take 50 mg by mouth 3 (three) times daily.)   04/06/2023 at 2000   Multiple Vitamin (MULTIVITAMIN WITH MINERALS) TABS tablet Take 1 tablet by mouth every evening.      naloxone (NARCAN) nasal spray 4 mg/0.1 mL Place 1 spray into the nose as needed for up to 365 doses (for opioid-induced respiratory depresssion). In case of emergency (overdose), spray once into each nostril. If no response within 3 minutes, repeat application and call 911. 1 each 0    nitroGLYCERIN (NITROSTAT) 0.4 MG SL tablet Place 0.4 mg under the tongue every 5 (five) minutes x 3 doses as needed for chest pain.    Unknown at PRN   Potassium 99 MG TABS Take 1 tablet by mouth daily.      Semaglutide, 1 MG/DOSE, 4 MG/3ML SOPN Inject 1 mg into the skin every Wednesday.      TRESIBA FLEXTOUCH 100 UNIT/ML FlexTouch Pen Inject 50 Units into the skin 2 (two) times daily.   04/06/2023 at 2000   [START ON 04/20/2023] HYDROcodone-acetaminophen (NORCO/VICODIN) 5-325 MG tablet Take 1 tablet by mouth 2 (two) times daily as needed for severe pain. Must last 30 days 60 tablet 0    [START ON 05/20/2023] HYDROcodone-acetaminophen (NORCO/VICODIN) 5-325 MG tablet Take 1 tablet by mouth 2 (two) times daily as  needed for severe pain. Must last 30 days 60 tablet 0     Assessment: Pharmacy consulted to dose heparin in this 67 year old male admitted with ACS/NSTEMI. Pt has a PMH of CAD/CABG. Trop elevated at 139. No prior anticoag noted.  8/4 1302  HL 0.24 SUBtherapeutic  8/4 1955  HL 0.18 Subtherapeutic 8/5 0306  HL 0.52       Therapeutic X 1  8/5 0909 HL 0.43  Therapeutic. X 2   Goal of Therapy:  Heparin level 0.3-0.7 units/ml Monitor platelets by anticoagulation protocol: Yes   Plan:  Heparin level is therapeutic. Will continue heparin infusion at 1200 units/hr. Recheck heparin level and CBC with AM labs. Plan on [facility] 8/5.   Ronnald Ramp, PharmD, BCPS 04/08/2023,10:24 AM

## 2023-04-08 NOTE — Plan of Care (Signed)
  Problem: Education: Goal: Understanding of cardiac disease, CV risk reduction, and recovery process will improve Outcome: Progressing Goal: Individualized Educational Video(s) Outcome: Progressing   Problem: Activity: Goal: Ability to tolerate increased activity will improve Outcome: Progressing   Problem: Cardiac: Goal: Ability to achieve and maintain adequate cardiovascular perfusion will improve Outcome: Progressing   Problem: Health Behavior/Discharge Planning: Goal: Ability to safely manage health-related needs after discharge will improve Outcome: Progressing   Problem: Education: Goal: Ability to describe self-care measures that may prevent or decrease complications (Diabetes Survival Skills Education) will improve Outcome: Progressing Goal: Individualized Educational Video(s) Outcome: Progressing   Problem: Coping: Goal: Ability to adjust to condition or change in health will improve Outcome: Progressing   Problem: Fluid Volume: Goal: Ability to maintain a balanced intake and output will improve Outcome: Progressing   Problem: Health Behavior/Discharge Planning: Goal: Ability to identify and utilize available resources and services will improve Outcome: Progressing Goal: Ability to manage health-related needs will improve Outcome: Progressing   Problem: Metabolic: Goal: Ability to maintain appropriate glucose levels will improve Outcome: Progressing   Problem: Nutritional: Goal: Maintenance of adequate nutrition will improve Outcome: Progressing Goal: Progress toward achieving an optimal weight will improve Outcome: Progressing   Problem: Skin Integrity: Goal: Risk for impaired skin integrity will decrease Outcome: Progressing   Problem: Tissue Perfusion: Goal: Adequacy of tissue perfusion will improve Outcome: Progressing   Problem: Education: Goal: Understanding of CV disease, CV risk reduction, and recovery process will improve Outcome:  Progressing Goal: Individualized Educational Video(s) Outcome: Progressing   Problem: Activity: Goal: Ability to return to baseline activity level will improve Outcome: Progressing   Problem: Cardiovascular: Goal: Ability to achieve and maintain adequate cardiovascular perfusion will improve Outcome: Progressing Goal: Vascular access site(s) Level 0-1 will be maintained Outcome: Progressing   Problem: Health Behavior/Discharge Planning: Goal: Ability to safely manage health-related needs after discharge will improve Outcome: Progressing   Problem: Education: Goal: Knowledge of General Education information will improve Description: Including pain rating scale, medication(s)/side effects and non-pharmacologic comfort measures Outcome: Progressing   Problem: Health Behavior/Discharge Planning: Goal: Ability to manage health-related needs will improve Outcome: Progressing   Problem: Clinical Measurements: Goal: Ability to maintain clinical measurements within normal limits will improve Outcome: Progressing Goal: Will remain free from infection Outcome: Progressing Goal: Diagnostic test results will improve Outcome: Progressing Goal: Respiratory complications will improve Outcome: Progressing Goal: Cardiovascular complication will be avoided Outcome: Progressing   Problem: Activity: Goal: Risk for activity intolerance will decrease Outcome: Progressing   Problem: Nutrition: Goal: Adequate nutrition will be maintained Outcome: Progressing   Problem: Coping: Goal: Level of anxiety will decrease Outcome: Progressing   Problem: Elimination: Goal: Will not experience complications related to bowel motility Outcome: Progressing Goal: Will not experience complications related to urinary retention Outcome: Progressing   Problem: Pain Managment: Goal: General experience of comfort will improve Outcome: Progressing   Problem: Safety: Goal: Ability to remain free from  injury will improve Outcome: Progressing   Problem: Skin Integrity: Goal: Risk for impaired skin integrity will decrease Outcome: Progressing   

## 2023-04-08 NOTE — Assessment & Plan Note (Signed)
Unstable angina. Patient with extensive CAD, came with chest pain.  Barely positive troponin and EKG seems stable but due to his extensive history cardiology took him to Cath Lab.  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 Cardiology is recommending -Continue with DAPT -Ranexa was added -They will arrange second opinion at [facility] for further management versus complicated PCI.

## 2023-04-08 NOTE — Progress Notes (Addendum)
  Progress Note   Patient: [name] [name]. WCB:[id number] DOB: 23-Jan-1956 DOA: 04/07/2023     0 DOS: the patient was seen and examined on 04/08/2023   Brief hospital course: Taken from prior notes.   [name] Field. is a 67 y.o. male with medical history significant of multiple medical issues including type 2 diabetes, alcohol use, hypertension, OSA on CPAP, diverticular disease, chronic pain, aortic valve stenosis, PAD status post common iliac artery stents, CAD status post CABG as well as PCI with stent angioplasty presenting with high risk chest pain.   On presentation hemodynamically stable, labs pertinent for troponin 26-52, creatinine 0.96, AST 53, ALT 65.  BNP 228.  Chest x-ray stable.  EKG sinus rhythm right bundle branch block.   8/5:Cardiology was consulted based on his risk factors and he was taken to Cath Lab, found to have multivessel disease with occluded prior graft, cardiology is recommending continuation of dual antiplatelet therapy, added Ranexa and they will refer him to [facility] for a second opinion about potential complex PCI.    Assessment and Plan: * Chest pain Unstable angina. Patient with extensive CAD, came with chest pain.  Barely positive troponin and EKG seems stable but due to his extensive history cardiology took him to Cath Lab.  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 Cardiology is recommending -Continue with DAPT -Ranexa was added -They will arrange second opinion at [facility] for further management versus complicated PCI.  Essential hypertension BP stable Titrate home regimen  Hyperlipidemia Continue statin  Alcohol withdrawal syndrome (HCC) Patient reports regular hard liquor use CIWA protocol   CKD stage 3 secondary to diabetes (HCC) History of CKD stage IIIa, creatinine appears to be at baseline with GFR more than 60. Likely CKD stage II -Monitor renal function -Avoid nephrotoxins  COPD (chronic obstructive  pulmonary disease) (HCC) Stable from a respiratory standpoint Continue home inhalers.  OSA (obstructive sleep apnea) CPAP  Chronic use of opiate for therapeutic purpose Continue home Vicodin   Subjective: Patient was seen after catheter today.  Continues to have some intermittent chest pain.  No associated symptoms.  Physical Exam: Vitals:   04/08/23 1230 04/08/23 1547 04/08/23 1553 04/08/23 1600  BP: (!) 87/62 (!) 137/99 121/75 132/72  Pulse:  77 77 80  Resp: 13 16 16 20   Temp:      TempSrc:      SpO2:  97% 93% 93%  Weight:      Height:       General.  Well-developed gentleman, in no acute distress. Pulmonary.  Lungs clear bilaterally, normal respiratory effort. CV.  Regular rate and rhythm, no JVD, rub or murmur. Abdomen.  Soft, nontender, nondistended, BS positive. CNS.  Alert and oriented .  No focal neurologic deficit. Extremities.  No edema, no cyanosis, pulses intact and symmetrical. Psychiatry.  Judgment and insight appears normal.   Data Reviewed: Prior data reviewed  Family Communication: Talked with sister on phone.  Disposition: Status is: Observation The patient will require care spanning > 2 midnights and should be moved to inpatient because: Severity of illness.  Planned Discharge Destination: Home  Time spent: 50 minutes.  This record has been created using Conservation officer, historic buildings. Errors have been sought and corrected,but may not always be located. Such creation errors do not reflect on the standard of care.   Author: Arnetha Courser, MD 04/08/2023 4:25 PM  For on call review [email or web address].

## 2023-04-09 ENCOUNTER — Encounter: Payer: Self-pay | Admitting: Cardiology

## 2023-04-09 DIAGNOSIS — I202 Refractory angina pectoris: Secondary | ICD-10-CM

## 2023-04-09 LAB — GLUCOSE, CAPILLARY
Glucose-Capillary: 92 mg/dL (ref 70–99)
Glucose-Capillary: 220 mg/dL — ABNORMAL HIGH (ref 70–99)
Glucose-Capillary: 137 mg/dL — ABNORMAL HIGH (ref 70–99)

## 2023-04-09 MED ORDER — RANOLAZINE ER 500 MG PO TB12: 500.0000 mg | ORAL_TABLET | Freq: Two times a day (BID) | ORAL | Status: DC

## 2023-04-09 MED ORDER — FOLIC ACID 1 MG PO TABS: 1.0000 mg | ORAL_TABLET | Freq: Every day | ORAL | Status: DC

## 2023-04-09 MED ORDER — ENOXAPARIN SODIUM 40 MG/0.4ML IJ SOSY: 40.0000 mg | PREFILLED_SYRINGE | INTRAMUSCULAR | Status: DC

## 2023-04-09 MED ORDER — VITAMIN B-1 100 MG PO TABS: 100.0000 mg | ORAL_TABLET | Freq: Every day | ORAL | Status: DC

## 2023-04-09 NOTE — Progress Notes (Signed)
Per [facility], patient will go to room [phone] and is under the accepting physician Barbaraann Cao Ngeno. Call report to #[phone]. Patient will go to main campus hospital ([address] [place], [place] [address]).   Life flight # [phone]

## 2023-04-09 NOTE — Discharge Summary (Signed)
Physician Discharge Summary   Patient: [name] [name]. MRN: [id number] DOB: 30-Nov-1955  Admit date:     04/07/2023  Discharge date: 04/09/23  Discharge Physician: Arnetha Courser   PCP: Shane Crutch, PA   Recommendations at discharge:  Patient is being transferred to [facility]  Discharge Diagnoses: Principal Problem:   Chest pain Active Problems:   Essential hypertension   Hyperlipidemia   Alcohol withdrawal syndrome (HCC)   CKD stage 3 secondary to diabetes (HCC)   COPD (chronic obstructive pulmonary disease) (HCC)   OSA (obstructive sleep apnea)   Chronic use of opiate for therapeutic purpose   Hospital Course: Taken from prior notes.   [name] [name]. is a 67 y.o. male with medical history significant of multiple medical issues including type 2 diabetes, alcohol use, hypertension, OSA on CPAP, diverticular disease, chronic pain, aortic valve stenosis, PAD status post common iliac artery stents, CAD status post CABG as well as PCI with stent angioplasty presenting with high risk chest pain.   On presentation hemodynamically stable, labs pertinent for troponin 26-52, creatinine 0.96, AST 53, ALT 65.  BNP 228.  Chest x-ray stable.  EKG sinus rhythm right bundle branch block.   8/5:Cardiology was consulted based on his risk factors and he was taken to Cath Lab, found to have multivessel disease with occluded prior graft, cardiology is recommending continuation of dual antiplatelet therapy, added Ranexa and they will refer him to [facility] for a second opinion about potential complex PCI.  8/6: Vitals and labs stable.  He is being accepted at [facility] per cardiology for a complicated PCI.  Patient is being discharged to [facility] for further management of his refractory angina and recurrent stenosis.  Assessment and Plan: * Chest pain Unstable angina. Patient with extensive CAD, came with chest pain.  Barely positive troponin and EKG seems stable but due to his extensive history cardiology took  him to Cath Lab.  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 Cardiology is recommending -Continue with DAPT -Ranexa was added -They will arrange second opinion at [facility] for further management versus complicated PCI.  Essential hypertension BP stable Titrate home regimen  Hyperlipidemia Continue statin  Alcohol withdrawal syndrome (HCC) Patient reports regular hard liquor use CIWA protocol   CKD stage 3 secondary to diabetes (HCC) History of CKD stage IIIa, creatinine appears to be at baseline with GFR more than 60. Likely CKD stage II -Monitor renal function -Avoid nephrotoxins  COPD (chronic obstructive pulmonary disease) (HCC) Stable from a respiratory standpoint Continue home inhalers.  OSA (obstructive sleep apnea) CPAP  Chronic use of opiate for therapeutic purpose Continue home Vicodin   Pain control - [place] Controlled Substance Reporting System database was reviewed. and patient was instructed, not to drive, operate heavy machinery, perform activities at heights, swimming or participation in water activities or provide baby-sitting services while on Pain, Sleep and Anxiety Medications; until their outpatient Physician has advised to do so again. Also recommended to not to take more than prescribed Pain, Sleep and Anxiety Medications.  Consultants: Cardiology Procedures performed: Cardiac catheterization Disposition:  [facility] Diet recommendation:  Discharge Diet Orders (From admission, onward)     Start     Ordered   04/09/23 0000  Diet - low sodium heart healthy        04/09/23 1324           Cardiac diet DISCHARGE MEDICATION: Allergies as of 04/09/2023   No Known Allergies  Medication List     TAKE these medications    allopurinol 100 MG tablet Commonly known as: ZYLOPRIM Take 100 mg by mouth daily.   aspirin EC 81 MG tablet Take 81 mg by mouth daily. Swallow whole.   atorvastatin 40 MG  tablet Commonly known as: LIPITOR Take 40 mg by mouth at bedtime.   Cholecalciferol 25 MCG (1000 UT) tablet Take 2,000 Units by mouth daily.   clopidogrel 75 MG tablet Commonly known as: Plavix Take 1 tablet (75 mg total) by mouth daily.   folic acid 1 MG tablet Commonly known as: FOLVITE Take 1 tablet (1 mg total) by mouth daily. Start taking on: April 10, 2023   gabapentin 600 MG tablet Commonly known as: NEURONTIN Take 1 tablet (600 mg total) by mouth 2 (two) times daily.   HYDROcodone-acetaminophen 5-325 MG tablet Commonly known as: NORCO/VICODIN Take 1 tablet by mouth 2 (two) times daily as needed for severe pain. Must last 30 days   HYDROcodone-acetaminophen 5-325 MG tablet Commonly known as: NORCO/VICODIN Take 1 tablet by mouth 2 (two) times daily as needed for severe pain. Must last 30 days Start taking on: April 20, 2023   HYDROcodone-acetaminophen 5-325 MG tablet Commonly known as: NORCO/VICODIN Take 1 tablet by mouth 2 (two) times daily as needed for severe pain. Must last 30 days Start taking on: May 20, 2023   irbesartan 150 MG tablet Commonly known as: AVAPRO Take 1 tablet (150 mg total) by mouth daily.   isosorbide mononitrate 60 MG 24 hr tablet Commonly known as: IMDUR Take 1 tablet (60 mg total) by mouth every morning.   metoprolol tartrate 50 MG tablet Commonly known as: LOPRESSOR Take 1 tablet (50 mg total) by mouth 2 (two) times daily. What changed: when to take this   multivitamin with minerals Tabs tablet Take 1 tablet by mouth every evening.   naloxone 4 MG/0.1ML Liqd nasal spray kit Commonly known as: NARCAN Place 1 spray into the nose as needed for up to 365 doses (for opioid-induced respiratory depresssion). In case of emergency (overdose), spray once into each nostril. If no response within 3 minutes, repeat application and call 911.   nitroGLYCERIN 0.4 MG SL tablet Commonly known as: NITROSTAT Place 0.4 mg under the tongue  every 5 (five) minutes x 3 doses as needed for chest pain.   Potassium 99 MG Tabs Take 1 tablet by mouth daily.   ranolazine 500 MG 12 hr tablet Commonly known as: RANEXA Take 1 tablet (500 mg total) by mouth 2 (two) times daily.   Semaglutide (1 MG/DOSE) 4 MG/3ML Sopn Inject 1 mg into the skin every Wednesday.   thiamine 100 MG tablet Commonly known as: Vitamin B-1 Take 1 tablet (100 mg total) by mouth daily. Start taking on: April 10, 2023   Evaristo Bury FlexTouch 100 UNIT/ML FlexTouch Pen Generic drug: insulin degludec Inject 50 Units into the skin 2 (two) times daily.        Follow-up Information     Paraschos, Alexander, MD. Go in 1 week(s).   Specialty: Cardiology Contact information: [address] [facility] [place] [address] [phone]                Discharge Exam: Ceasar Mons Weights   04/07/23 0528 04/07/23 1751  Weight: 77.7 kg 72.7 kg   General.  Well-developed gentleman, in no acute distress. Pulmonary.  Lungs clear bilaterally, normal respiratory effort. CV.  Regular rate and rhythm, no JVD, rub or murmur. Abdomen.  Soft, nontender, nondistended, BS positive. CNS.  Alert and oriented .  No focal neurologic deficit. Extremities.  No edema, no cyanosis, pulses intact and symmetrical. Psychiatry.  Judgment and insight appears normal.   Condition at discharge: stable  The results of significant diagnostics from this hospitalization (including imaging, microbiology, ancillary and laboratory) are listed below for reference.   Imaging Studies: CARDIAC CATHETERIZATION  Result Date: 04/08/2023   Mid LM to Dist LM lesion is 75% stenosed.   Ost Cx to Prox Cx lesion is 95% stenosed.   Mid LAD lesion is 80% stenosed.   Prox RCA lesion is 75% stenosed.   Mid RCA lesion is 70% stenosed.   1st Mrg lesion is 100% stenosed.   2nd Mrg lesion is 60% stenosed.   Origin lesion is 100% stenosed.   There is mild left ventricular systolic  dysfunction.   The left ventricular ejection fraction is 45-50% by visual estimate. 1.  Three-vessel coronary artery disease with 75% stenosis distal left main/ostial LAD, 80% stenosis mid LAD, 95% stenosis ostial/proximal left circumflex, occluded OM1, 60% stenosis OM2, 75% stenosis proximal RCA and 70% stenosis mid RCA with occluded RIMA graft to LAD, patent SVG to left PDA, patent SVG to OM 2 2.  Mild reduced left ventricular function with estimated LV ejection fraction 45 to 50% 3.  Mild aortic stenosis with calculated aortic valve area 1.24 cm, mean gradient 24.1 mmHg Recommendations 1.  Continue dual antiplatelet therapy 2.  Continue aggressive risk factor modification 3.  Add Ranexa 500 mg twice daily 4.  Obtain second opinion [facility] ) about potential complex PCI of LAD   DG Chest Port 1 View  Result Date: 04/07/2023 CLINICAL DATA:  Chest pain EXAM: PORTABLE CHEST 1 VIEW COMPARISON:  02/22/2023 FINDINGS: Cardiomegaly. Prior CABG. Mild central venous prominence. There is no edema, consolidation, effusion, or pneumothorax. IMPRESSION: No acute finding. Electronically Signed   By: Tiburcio Pea M.D.   On: 04/07/2023 06:05    Microbiology: Results for orders placed or performed during the hospital encounter of 04/07/23  Surgical PCR screen     Status: None   Collection Time: 04/07/23  2:00 AM   Specimen: Nasal Mucosa; Nasal Swab  Result Value Ref Range Status   MRSA, PCR NEGATIVE NEGATIVE Final   Staphylococcus aureus NEGATIVE NEGATIVE Final    Comment: (NOTE) The Xpert SA Assay (FDA approved for NASAL specimens in patients 52 years of age and older), is one component of a comprehensive surveillance program. It is not intended to diagnose infection nor to guide or monitor treatment. Performed at [facility], [address] [place], [place] [address]     Labs: CBC: Recent Labs  Lab 04/07/23 0530 04/08/23 0306 04/08/23 1427 04/08/23 1428 04/09/23 0428  WBC 6.7 7.9  --    --  7.0  NEUTROABS 4.2  --   --   --   --   HGB 13.9 13.3 12.6* 11.9* 12.7*  HCT 40.1 37.6* 37.0* 35.0* 36.1*  MCV 87.6 86.2  --   --  85.5  PLT 157 151  --   --  155   Basic Metabolic Panel: Recent Labs  Lab 04/07/23 0530 04/08/23 0909 04/08/23 1427 04/08/23 1428  NA 137 136 138 140  K 4.0 4.2 4.2 3.9  CL 104 104  --   --   CO2 22 23  --   --   GLUCOSE 112* 68*  --   --   BUN 29* 27*  --   --  CREATININE 0.96 1.07  --   --   CALCIUM 9.0 9.0  --   --    Liver Function Tests: Recent Labs  Lab 04/07/23 0530  AST 53*  ALT 65*  ALKPHOS 85  BILITOT 0.7  PROT 7.9  ALBUMIN 4.3   CBG: Recent Labs  Lab 04/08/23 0849 04/08/23 1758 04/08/23 2057 04/09/23 0849 04/09/23 1306  GLUCAP 91 66* 300* 92 220*    Discharge time spent: greater than 30 minutes.  This record has been created using Conservation officer, historic buildings. Errors have been sought and corrected,but may not always be located. Such creation errors do not reflect on the standard of care.   Signed: Arnetha Courser, MD [facility] 04/09/2023

## 2023-04-09 NOTE — Plan of Care (Signed)
Discharging to [facility]

## 2023-04-09 NOTE — Progress Notes (Signed)
Report called to [facility], unit 7300, patient will go to room 7321 [name] Client, RN recd report.

## 2023-04-09 NOTE — Plan of Care (Signed)
  Problem: Education: Goal: Understanding of cardiac disease, CV risk reduction, and recovery process will improve Outcome: Progressing Goal: Individualized Educational Video(s) Outcome: Progressing   Problem: Activity: Goal: Ability to tolerate increased activity will improve Outcome: Progressing   Problem: Cardiac: Goal: Ability to achieve and maintain adequate cardiovascular perfusion will improve Outcome: Progressing   Problem: Health Behavior/Discharge Planning: Goal: Ability to safely manage health-related needs after discharge will improve Outcome: Progressing   Problem: Education: Goal: Ability to describe self-care measures that may prevent or decrease complications (Diabetes Survival Skills Education) will improve Outcome: Progressing Goal: Individualized Educational Video(s) Outcome: Progressing   Problem: Coping: Goal: Ability to adjust to condition or change in health will improve Outcome: Progressing   Problem: Fluid Volume: Goal: Ability to maintain a balanced intake and output will improve Outcome: Progressing   Problem: Health Behavior/Discharge Planning: Goal: Ability to identify and utilize available resources and services will improve Outcome: Progressing Goal: Ability to manage health-related needs will improve Outcome: Progressing   Problem: Metabolic: Goal: Ability to maintain appropriate glucose levels will improve Outcome: Progressing   Problem: Nutritional: Goal: Maintenance of adequate nutrition will improve Outcome: Progressing Goal: Progress toward achieving an optimal weight will improve Outcome: Progressing   Problem: Skin Integrity: Goal: Risk for impaired skin integrity will decrease Outcome: Progressing   Problem: Tissue Perfusion: Goal: Adequacy of tissue perfusion will improve Outcome: Progressing   Problem: Education: Goal: Understanding of CV disease, CV risk reduction, and recovery process will improve Outcome:  Progressing Goal: Individualized Educational Video(s) Outcome: Progressing   Problem: Activity: Goal: Ability to return to baseline activity level will improve Outcome: Progressing   Problem: Cardiovascular: Goal: Ability to achieve and maintain adequate cardiovascular perfusion will improve Outcome: Progressing Goal: Vascular access site(s) Level 0-1 will be maintained Outcome: Progressing   Problem: Health Behavior/Discharge Planning: Goal: Ability to safely manage health-related needs after discharge will improve Outcome: Progressing   Problem: Education: Goal: Knowledge of General Education information will improve Description: Including pain rating scale, medication(s)/side effects and non-pharmacologic comfort measures Outcome: Progressing   Problem: Health Behavior/Discharge Planning: Goal: Ability to manage health-related needs will improve Outcome: Progressing   Problem: Clinical Measurements: Goal: Ability to maintain clinical measurements within normal limits will improve Outcome: Progressing Goal: Will remain free from infection Outcome: Progressing Goal: Diagnostic test results will improve Outcome: Progressing Goal: Respiratory complications will improve Outcome: Progressing Goal: Cardiovascular complication will be avoided Outcome: Progressing   Problem: Activity: Goal: Risk for activity intolerance will decrease Outcome: Progressing   Problem: Nutrition: Goal: Adequate nutrition will be maintained Outcome: Progressing   Problem: Coping: Goal: Level of anxiety will decrease Outcome: Progressing   Problem: Elimination: Goal: Will not experience complications related to bowel motility Outcome: Progressing Goal: Will not experience complications related to urinary retention Outcome: Progressing   Problem: Pain Managment: Goal: General experience of comfort will improve Outcome: Progressing   Problem: Safety: Goal: Ability to remain free from  injury will improve Outcome: Progressing   Problem: Skin Integrity: Goal: Risk for impaired skin integrity will decrease Outcome: Progressing   

## 2023-04-09 NOTE — Progress Notes (Signed)
[facility] Cardiology  CARDIOLOGY CONSULT NOTE  Patient ID: [name] [name]. MRN: [id number] DOB/AGE: 07-Mar-1956 67 y.o.  Admit date: 04/07/2023 Referring Physician Alvester Morin Primary Physician [facility] Primary Cardiologist [facility] Reason for Consultation unstable angina  HPI: 67 year old gentleman referred for evaluation of unstable angina.  The patient has known coronary artery disease, status post CABG times 3, 04/22/2012, coronary stents times 2, 2014 at [facility], occluded LIMA to LAD 02/03/2016, status post redo CABG with RIMA to LAD 03/30/2016 with patent saphenous vein bypass graft to D1 and PDA.  The patient was recently hospitalized 02/22/2023 for unstable angina.  Lexiscan Myoview revealed persistent inferolateral basal apical defect without significant redistribution consistent with scar.  2D echocardiogram 02/22/2023 revealed normal LV function, with LVEF 50-55%, with mild to moderate aortic stenosis, aortic valve area 1.08 cm, peak gradient 23.8 mmHg, mean gradient 15.3 mmHg.  The patient also has known peripheral vascular disease, status post dense common iliac arteries.  He presents with several day history of intermittent episodes of left-sided chest pain which occur with and without exertion.  CT reveals sinus rhythm with right bundle branch block and nonspecific ST-T wave abnormalities.  Admission laboratories reveal mildly elevated sensitivity troponin (26, 52).  Interval History:  - seen and examined this AM, had some chest pain last night that resolved after SL nitroglycerin and morphine - chest pain free at my time of evaluation, no dyspnea, palpitations, peripheral edema  - Dr. Darrold Junker sent LHC films over to [facility] for review, they recommend transfer to [facility] for complex PCI to LAD    Review of systems complete and found to be negative unless listed above     Past Medical History:  Diagnosis Date   Aortic stenosis    Benign esophageal stricture    Cardiomyopathy,  secondary (HCC)    Coronary artery disease    Diabetes mellitus without complication (HCC)    Diverticulitis    GI bleed    Gout    Hemorrhoids    Hyperlipidemia    Hypertension    Sleep apnea    Tubular adenoma of colon     Past Surgical History:  Procedure Laterality Date   cardiac bypass     CARDIAC CATHETERIZATION Left 02/03/2016   Procedure: Left Heart Cath and Coronary Angiography;  Surgeon: Dalia Heading, MD;  Location: [facility] INVASIVE CV LAB;  Service: Cardiovascular;  Laterality: Left;   CARDIAC SURGERY     COLONOSCOPY WITH PROPOFOL N/A 04/29/2015   Procedure: COLONOSCOPY WITH PROPOFOL;  Surgeon: Wallace Cullens, MD;  Location: [facility] ENDOSCOPY;  Service: Gastroenterology;  Laterality: N/A;   COLONOSCOPY WITH PROPOFOL N/A 07/20/2020   Procedure: COLONOSCOPY WITH PROPOFOL;  Surgeon: Toledo, Boykin Nearing, MD;  Location: [facility] ENDOSCOPY;  Service: Gastroenterology;  Laterality: N/A;   CORONARY ANGIOPLASTY WITH STENT PLACEMENT     x2   CORONARY ARTERY BYPASS GRAFT     2013 & 2017   ESOPHAGOGASTRODUODENOSCOPY (EGD) WITH PROPOFOL N/A 04/29/2015   Procedure: ESOPHAGOGASTRODUODENOSCOPY (EGD) WITH PROPOFOL;  Surgeon: Wallace Cullens, MD;  Location: [facility] ENDOSCOPY;  Service: Gastroenterology;  Laterality: N/A;   LEFT HEART CATH N/A 04/08/2023   Procedure: Left Heart Cath;  Surgeon: Marcina Millard, MD;  Location: [facility] INVASIVE CV LAB;  Service: Cardiovascular;  Laterality: N/A;   LOWER EXTREMITY ANGIOGRAPHY Left 11/20/2021   Procedure: Lower Extremity Angiography;  Surgeon: Annice Needy, MD;  Location: [facility] INVASIVE CV LAB;  Service: Cardiovascular;  Laterality: Left;   SEPTOPLASTY  Medications Prior to Admission  Medication Sig Dispense Refill Last Dose   allopurinol (ZYLOPRIM) 100 MG tablet Take 100 mg by mouth daily.    04/06/2023 at Unknown   aspirin EC 81 MG tablet Take 81 mg by mouth daily. Swallow whole.      atorvastatin (LIPITOR) 40 MG tablet Take 40 mg by mouth at bedtime.    04/06/2023 at  2000   Cholecalciferol 25 MCG (1000 UT) tablet Take 2,000 Units by mouth daily.      clopidogrel (PLAVIX) 75 MG tablet Take 1 tablet (75 mg total) by mouth daily. 30 tablet 11 04/06/2023 at Unknown   gabapentin (NEURONTIN) 600 MG tablet Take 1 tablet (600 mg total) by mouth 2 (two) times daily. 60 tablet 2 04/06/2023 at 2000   HYDROcodone-acetaminophen (NORCO/VICODIN) 5-325 MG tablet Take 1 tablet by mouth 2 (two) times daily as needed for severe pain. Must last 30 days 60 tablet 0 Unknown at PRN   irbesartan (AVAPRO) 150 MG tablet Take 1 tablet (150 mg total) by mouth daily. 30 tablet 2 04/06/2023 at Unknown   isosorbide mononitrate (IMDUR) 60 MG 24 hr tablet Take 1 tablet (60 mg total) by mouth every morning. 30 tablet 2 04/06/2023 at 0800   metoprolol tartrate (LOPRESSOR) 50 MG tablet Take 1 tablet (50 mg total) by mouth 2 (two) times daily. (Patient taking differently: Take 50 mg by mouth 3 (three) times daily.)   04/06/2023 at 2000   Multiple Vitamin (MULTIVITAMIN WITH MINERALS) TABS tablet Take 1 tablet by mouth every evening.      naloxone (NARCAN) nasal spray 4 mg/0.1 mL Place 1 spray into the nose as needed for up to 365 doses (for opioid-induced respiratory depresssion). In case of emergency (overdose), spray once into each nostril. If no response within 3 minutes, repeat application and call 911. 1 each 0    nitroGLYCERIN (NITROSTAT) 0.4 MG SL tablet Place 0.4 mg under the tongue every 5 (five) minutes x 3 doses as needed for chest pain.    Unknown at PRN   Potassium 99 MG TABS Take 1 tablet by mouth daily.      Semaglutide, 1 MG/DOSE, 4 MG/3ML SOPN Inject 1 mg into the skin every Wednesday.      TRESIBA FLEXTOUCH 100 UNIT/ML FlexTouch Pen Inject 50 Units into the skin 2 (two) times daily.   04/06/2023 at 2000   [START ON 04/20/2023] HYDROcodone-acetaminophen (NORCO/VICODIN) 5-325 MG tablet Take 1 tablet by mouth 2 (two) times daily as needed for severe pain. Must last 30 days 60 tablet 0    [START ON  05/20/2023] HYDROcodone-acetaminophen (NORCO/VICODIN) 5-325 MG tablet Take 1 tablet by mouth 2 (two) times daily as needed for severe pain. Must last 30 days 60 tablet 0    Social History   Socioeconomic History   Marital status: Widowed    Spouse name: Not on file   Number of children: Not on file   Years of education: Not on file   Highest education level: Not on file  Occupational History   Occupation: retired  Tobacco Use   Smoking status: Former    Types: Cigarettes   Smokeless tobacco: Former  Substance and Sexual Activity   Alcohol use: Yes    Alcohol/week: 14.0 standard drinks of alcohol    Types: 14 Shots of liquor per week    Comment: couple drinks daily   Drug use: No   Sexual activity: Not on file  Other Topics Concern   Not on file  Social History Narrative   ED visit around the end of October to have chest pain checked which was benign   Social Determinants of Health   Financial Resource Strain: Not on file  Food Insecurity: No Food Insecurity (04/07/2023)   Hunger Vital Sign    Worried About Running Out of Food in the Last Year: Never true    Ran Out of Food in the Last Year: Never true  Transportation Needs: No Transportation Needs (04/07/2023)   PRAPARE - Administrator, Civil Service (Medical): No    Lack of Transportation (Non-Medical): No  Physical Activity: Not on file  Stress: Not on file  Social Connections: Not on file  Intimate Partner Violence: Not At Risk (04/07/2023)   Humiliation, Afraid, Rape, and Kick questionnaire    Fear of Current or Ex-Partner: No    Emotionally Abused: No    Physically Abused: No    Sexually Abused: No    Family History  Problem Relation Age of Onset   Heart disease Mother    Hypertension Father      PHYSICAL EXAM  General: elderly male, laying flat in bed  HEENT:  Normocephalic and atraumatic Neck:  No JVD.  Lungs: normal respiratory effort on room air, no crackles or wheezing Heart: HRRR . Normal  S1 and S2 without gallops or murmurs.  Abdomen: nondistended appearing  Msk:  Normal strength and tone for age. Extremities: No clubbing, cyanosis or edema.   Neuro: Alert and oriented X 3. Psych:  answers questions appropriately   Labs:   Lab Results  Component Value Date   WBC 7.0 04/09/2023   HGB 12.7 (L) 04/09/2023   HCT 36.1 (L) 04/09/2023   MCV 85.5 04/09/2023   PLT 155 04/09/2023    Recent Labs  Lab 04/07/23 0530 04/08/23 0909 04/08/23 1427 04/08/23 1428  NA 137 136   < > 140  K 4.0 4.2   < > 3.9  CL 104 104  --   --   CO2 22 23  --   --   BUN 29* 27*  --   --   CREATININE 0.96 1.07  --   --   CALCIUM 9.0 9.0  --   --   PROT 7.9  --   --   --   BILITOT 0.7  --   --   --   ALKPHOS 85  --   --   --   ALT 65*  --   --   --   AST 53*  --   --   --   GLUCOSE 112* 68*  --   --    < > = values in this interval not displayed.   Lab Results  Component Value Date   CKTOTAL 82 05/16/2013   CKMB 1.4 05/16/2013   TROPONINI <0.03 06/16/2017    Lab Results  Component Value Date   CHOL 82 04/27/2022   CHOL 99 02/29/2016   CHOL 181 04/18/2012   Lab Results  Component Value Date   HDL 26 (L) 04/27/2022   HDL 30 (L) 02/29/2016   HDL 28 (L) 04/18/2012   Lab Results  Component Value Date   LDLCALC 26 04/27/2022   LDLCALC 52 02/29/2016   LDLCALC 107 (H) 04/18/2012   Lab Results  Component Value Date   TRIG 151 (H) 04/27/2022   TRIG 83 02/29/2016   TRIG 230 (H) 04/18/2012   Lab Results  Component Value Date   CHOLHDL 3.2  04/27/2022   CHOLHDL 3.3 02/29/2016   No results found for: "LDLDIRECT"    Radiology: CARDIAC CATHETERIZATION  Result Date: 04/08/2023   Mid LM to Dist LM lesion is 75% stenosed.   Ost Cx to Prox Cx lesion is 95% stenosed.   Mid LAD lesion is 80% stenosed.   Prox RCA lesion is 75% stenosed.   Mid RCA lesion is 70% stenosed.   1st Mrg lesion is 100% stenosed.   2nd Mrg lesion is 60% stenosed.   Origin lesion is 100% stenosed.   There is mild  left ventricular systolic dysfunction.   The left ventricular ejection fraction is 45-50% by visual estimate. 1.  Three-vessel coronary artery disease with 75% stenosis distal left main/ostial LAD, 80% stenosis mid LAD, 95% stenosis ostial/proximal left circumflex, occluded OM1, 60% stenosis OM2, 75% stenosis proximal RCA and 70% stenosis mid RCA with occluded RIMA graft to LAD, patent SVG to left PDA, patent SVG to OM 2 2.  Mild reduced left ventricular function with estimated LV ejection fraction 45 to 50% 3.  Mild aortic stenosis with calculated aortic valve area 1.24 cm, mean gradient 24.1 mmHg Recommendations 1.  Continue dual antiplatelet therapy 2.  Continue aggressive risk factor modification 3.  Add Ranexa 500 mg twice daily 4.  Obtain second opinion [facility] ) about potential complex PCI of LAD   DG Chest Port 1 View  Result Date: 04/07/2023 CLINICAL DATA:  Chest pain EXAM: PORTABLE CHEST 1 VIEW COMPARISON:  02/22/2023 FINDINGS: Cardiomegaly. Prior CABG. Mild central venous prominence. There is no edema, consolidation, effusion, or pneumothorax. IMPRESSION: No acute finding. Electronically Signed   By: Tiburcio Pea M.D.   On: 04/07/2023 06:05    EKG: Sinus rhythm with right bundle branch block and nonspecific ST-T wave abnormalities  ASSESSMENT AND PLAN:   1.  Unstable angina, recurring left-sided chest pain with typical and atypical features, borderline elevated high-sensitivity troponin (26, 52), and abnormal ECG with sinus rhythm and right bundle branch block with nonspecific ST-T wave abnormalities 2.  CAD, status post CABG x 3 04/22/2012, status post coronary stents times 10/2012, redo RIMA-LAD with patent saphenous vein bypass graft to D1 and PDA 05/31/2016. 3.  Mild to moderate aortic stenosis, calculated aortic valve area 1.08 cm, peak velocity 2.44 m/s, peak gradient 23.8 mmHg, mean gradient 15.3 mmHg 02/25/2023 4.  Essential hypertension, on irbesartan, and metoprolol tartrate 5.   Hyperlipidemia, on atorvastatin 6.  Type 2 diabetes, on insulin, and Ozempic 7.  Peripheral vascular disease, status post stents bilateral common iliac arteries 11/20/2021  Recommendations  1.  Agree with current therapy 2.  Continue DAPT with aspirin and plavix  3.  Continue ranexa 500mg  BID  4.  Transfer to [facility] for complex PCI to LAD. Accepting physician Dr. Renato Gails.   This patient's plan of care was discussed and created with Dr. Darrold Junker and he is in agreement.    Signed: Cheryln Manly  PA-C 04/09/2023, 9:41 AM

## 2023-04-10 DIAGNOSIS — I214 Non-ST elevation (NSTEMI) myocardial infarction: Secondary | ICD-10-CM | POA: Insufficient documentation

## 2023-04-26 ENCOUNTER — Other Ambulatory Visit (INDEPENDENT_AMBULATORY_CARE_PROVIDER_SITE_OTHER): Payer: Self-pay | Admitting: Nurse Practitioner

## 2023-04-26 DIAGNOSIS — Z9889 Other specified postprocedural states: Secondary | ICD-10-CM

## 2023-05-07 ENCOUNTER — Ambulatory Visit (INDEPENDENT_AMBULATORY_CARE_PROVIDER_SITE_OTHER): Payer: Medicare Other

## 2023-05-07 ENCOUNTER — Encounter (INDEPENDENT_AMBULATORY_CARE_PROVIDER_SITE_OTHER): Payer: Self-pay | Admitting: Vascular Surgery

## 2023-05-07 ENCOUNTER — Ambulatory Visit (INDEPENDENT_AMBULATORY_CARE_PROVIDER_SITE_OTHER): Payer: Medicare Other | Admitting: Vascular Surgery

## 2023-05-07 VITALS — BP 120/72 | HR 74 | Resp 16 | Wt 162.4 lb

## 2023-05-07 DIAGNOSIS — I10 Essential (primary) hypertension: Secondary | ICD-10-CM | POA: Diagnosis not present

## 2023-05-07 DIAGNOSIS — Z9889 Other specified postprocedural states: Secondary | ICD-10-CM | POA: Diagnosis not present

## 2023-05-07 DIAGNOSIS — E785 Hyperlipidemia, unspecified: Secondary | ICD-10-CM | POA: Diagnosis not present

## 2023-05-07 DIAGNOSIS — I70213 Atherosclerosis of native arteries of extremities with intermittent claudication, bilateral legs: Secondary | ICD-10-CM | POA: Diagnosis not present

## 2023-05-07 DIAGNOSIS — I739 Peripheral vascular disease, unspecified: Secondary | ICD-10-CM

## 2023-05-07 DIAGNOSIS — E11649 Type 2 diabetes mellitus with hypoglycemia without coma: Secondary | ICD-10-CM

## 2023-05-07 DIAGNOSIS — Z794 Long term (current) use of insulin: Secondary | ICD-10-CM

## 2023-05-07 NOTE — Progress Notes (Signed)
MRN : [id number]  [name] [name]. is a 67 y.o. (08/24/1956) male who presents with chief complaint of  Chief Complaint  Patient presents with   Follow-up    Ultrasound follow up  .  History of Present Illness: Patient returns today in follow up of his peripheral arterial disease.  He has undergone extensive coronary revascularization since his last visit.  He is currently doing well in terms of his legs.  No lifestyle limiting claudication, ischemic rest pain, or ulceration.  He underwent iliac intervention about a year and a half ago.  He remains on dual antiplatelet therapy and a statin agent both for his PAD and his coronary disease.  His ABIs today are stable to slightly improved at 1.10 on the right and 0.83 on the left with digit pressures of 136 on the right and 110 on the left.  Current Outpatient Medications  Medication Sig Dispense Refill   allopurinol (ZYLOPRIM) 100 MG tablet Take 100 mg by mouth daily.      aspirin EC 81 MG tablet Take 81 mg by mouth daily. Swallow whole.     atorvastatin (LIPITOR) 40 MG tablet Take 40 mg by mouth at bedtime.      Cholecalciferol 25 MCG (1000 UT) tablet Take 2,000 Units by mouth daily.     clopidogrel (PLAVIX) 75 MG tablet Take 1 tablet (75 mg total) by mouth daily. 30 tablet 11   gabapentin (NEURONTIN) 600 MG tablet Take 1 tablet (600 mg total) by mouth 2 (two) times daily. 60 tablet 2   HYDROcodone-acetaminophen (NORCO/VICODIN) 5-325 MG tablet Take 1 tablet by mouth 2 (two) times daily as needed for severe pain. Must last 30 days 60 tablet 0   [START ON 05/20/2023] HYDROcodone-acetaminophen (NORCO/VICODIN) 5-325 MG tablet Take 1 tablet by mouth 2 (two) times daily as needed for severe pain. Must last 30 days 60 tablet 0   irbesartan (AVAPRO) 150 MG tablet Take 1 tablet (150 mg total) by mouth daily. (Patient taking differently: Take 150 mg by mouth daily. Take 1/2 tablet at bedtime) 30 tablet 2   isosorbide mononitrate (IMDUR) 60 MG 24 hr  tablet Take 1 tablet (60 mg total) by mouth every morning. 30 tablet 2   metoprolol succinate (TOPROL-XL) 25 MG 24 hr tablet Take 1 tablet by mouth 2 (two) times daily.     Multiple Vitamin (MULTIVITAMIN WITH MINERALS) TABS tablet Take 1 tablet by mouth every evening.     naloxone (NARCAN) nasal spray 4 mg/0.1 mL Place 1 spray into the nose as needed for up to 365 doses (for opioid-induced respiratory depresssion). In case of emergency (overdose), spray once into each nostril. If no response within 3 minutes, repeat application and call 911. 1 each 0   nitroGLYCERIN (NITROSTAT) 0.4 MG SL tablet Place 0.4 mg under the tongue every 5 (five) minutes x 3 doses as needed for chest pain.      Potassium 99 MG TABS Take 1 tablet by mouth daily.     ranolazine (RANEXA) 500 MG 12 hr tablet Take 1 tablet (500 mg total) by mouth 2 (two) times daily.     Semaglutide, 1 MG/DOSE, 4 MG/3ML SOPN Inject 1 mg into the skin every Wednesday.     thiamine (VITAMIN B-1) 100 MG tablet Take 1 tablet (100 mg total) by mouth daily.     TRESIBA FLEXTOUCH 100 UNIT/ML FlexTouch Pen Inject 50 Units into the skin 2 (two) times daily. 50 units in the morning and 25  units in evening     folic acid (FOLVITE) 1 MG tablet Take 1 tablet (1 mg total) by mouth daily. (Patient not taking: Reported on 05/07/2023)     HYDROcodone-acetaminophen (NORCO/VICODIN) 5-325 MG tablet Take 1 tablet by mouth 2 (two) times daily as needed for severe pain. Must last 30 days 60 tablet 0   metoprolol tartrate (LOPRESSOR) 50 MG tablet Take 1 tablet (50 mg total) by mouth 2 (two) times daily. (Patient taking differently: Take 50 mg by mouth 3 (three) times daily.)     No current facility-administered medications for this visit.    Past Medical History:  Diagnosis Date   Aortic stenosis    Benign esophageal stricture    Cardiomyopathy, secondary (HCC)    Coronary artery disease    Diabetes mellitus without complication (HCC)    Diverticulitis    GI  bleed    Gout    Hemorrhoids    Hyperlipidemia    Hypertension    Sleep apnea    Tubular adenoma of colon     Past Surgical History:  Procedure Laterality Date   cardiac bypass     CARDIAC CATHETERIZATION Left 02/03/2016   Procedure: Left Heart Cath and Coronary Angiography;  Surgeon: Dalia Heading, MD;  Location: [facility] INVASIVE CV LAB;  Service: Cardiovascular;  Laterality: Left;   CARDIAC SURGERY     COLONOSCOPY WITH PROPOFOL N/A 04/29/2015   Procedure: COLONOSCOPY WITH PROPOFOL;  Surgeon: Wallace Cullens, MD;  Location: [facility] ENDOSCOPY;  Service: Gastroenterology;  Laterality: N/A;   COLONOSCOPY WITH PROPOFOL N/A 07/20/2020   Procedure: COLONOSCOPY WITH PROPOFOL;  Surgeon: Toledo, Boykin Nearing, MD;  Location: [facility] ENDOSCOPY;  Service: Gastroenterology;  Laterality: N/A;   CORONARY ANGIOPLASTY WITH STENT PLACEMENT     x2   CORONARY ARTERY BYPASS GRAFT     2013 & 2017   ESOPHAGOGASTRODUODENOSCOPY (EGD) WITH PROPOFOL N/A 04/29/2015   Procedure: ESOPHAGOGASTRODUODENOSCOPY (EGD) WITH PROPOFOL;  Surgeon: Wallace Cullens, MD;  Location: [facility] ENDOSCOPY;  Service: Gastroenterology;  Laterality: N/A;   LEFT HEART CATH N/A 04/08/2023   Procedure: Left Heart Cath;  Surgeon: Marcina Millard, MD;  Location: [facility] INVASIVE CV LAB;  Service: Cardiovascular;  Laterality: N/A;   LOWER EXTREMITY ANGIOGRAPHY Left 11/20/2021   Procedure: Lower Extremity Angiography;  Surgeon: Annice Needy, MD;  Location: [facility] INVASIVE CV LAB;  Service: Cardiovascular;  Laterality: Left;   SEPTOPLASTY       Social History   Tobacco Use   Smoking status: Former    Types: Cigarettes   Smokeless tobacco: Former  Substance Use Topics   Alcohol use: Yes    Alcohol/week: 14.0 standard drinks of alcohol    Types: 14 Shots of liquor per week    Comment: couple drinks daily   Drug use: No      Family History  Problem Relation Age of Onset   Heart disease Mother    Hypertension Father      No Known Allergies   REVIEW OF  SYSTEMS (Negative unless checked)  Constitutional: [] Weight loss  [] Fever  [] Chills Cardiac: [] Chest pain   [] Chest pressure   [] Palpitations   [] Shortness of breath when laying flat   [] Shortness of breath at rest   [] Shortness of breath with exertion. Vascular:  [x] Pain in legs with walking   [] Pain in legs at rest   [] Pain in legs when laying flat   [] Claudication   [] Pain in feet when walking  [] Pain in feet at rest  [] Pain in  feet when laying flat   [] History of DVT   [] Phlebitis   [] Swelling in legs   [] Varicose veins   [] Non-healing ulcers Pulmonary:   [] Uses home oxygen   [] Productive cough   [] Hemoptysis   [] Wheeze  [] COPD   [] Asthma Neurologic:  [] Dizziness  [] Blackouts   [] Seizures   [] History of stroke   [] History of TIA  [] Aphasia   [] Temporary blindness   [] Dysphagia   [] Weakness or numbness in arms   [] Weakness or numbness in legs Musculoskeletal:  [x] Arthritis   [] Joint swelling   [x] Joint pain   [] Low back pain Hematologic:  [] Easy bruising  [] Easy bleeding   [] Hypercoagulable state   [] Anemic   Gastrointestinal:  [] Blood in stool   [] Vomiting blood  [] Gastroesophageal reflux/heartburn   [] Abdominal pain Genitourinary:  [] Chronic kidney disease   [] Difficult urination  [] Frequent urination  [] Burning with urination   [] Hematuria Skin:  [] Rashes   [] Ulcers   [] Wounds Psychological:  [] History of anxiety   []  History of major depression.  Physical Examination  BP 120/72 (BP Location: Left Arm)   Pulse 74   Resp 16   Wt 162 lb 6.4 oz (73.7 kg)   BMI 28.77 kg/m  Gen:  WD/WN, NAD Head: [place]/AT, No temporalis wasting. Ear/Nose/Throat: Hearing grossly intact, nares w/o erythema or drainage Eyes: Conjunctiva clear. Sclera non-icteric Neck: Supple.  Trachea midline Pulmonary:  Good air movement, no use of accessory muscles.  Cardiac: RRR, no JVD Vascular:  Vessel Right Left  Radial Palpable Palpable                          PT Palpable Palpable  DP Palpable Palpable    Gastrointestinal: soft, non-tender/non-distended. No guarding/reflex.  Musculoskeletal: M/S 5/5 throughout.  No deformity or atrophy. No edema. Neurologic: Sensation grossly intact in extremities.  Symmetrical.  Speech is fluent.  Psychiatric: Judgment intact, Mood & affect appropriate for pt's clinical situation. Dermatologic: No rashes or ulcers noted.  No cellulitis or open wounds.      Labs Recent Results (from the past 2160 hour(s))  Tox[employer] Select 13 (MW), Urine     Status: None   Collection Time: 02/11/23  3:37 PM  Result Value Ref Range   Summary Note     Comment: ==================================================================== Tox[employer] Select 13 (MW) ==================================================================== Test                             Result       Flag       Units  Drug Present and Declared for Prescription Verification   Hydrocodone                    1779         EXPECTED   ng/mg creat   Hydromorphone                  394          EXPECTED   ng/mg creat   Dihydrocodeine                 61           EXPECTED   ng/mg creat   Norhydrocodone                 736          EXPECTED   ng/mg creat    Sources of hydrocodone include  scheduled prescription medications.    Hydromorphone, dihydrocodeine and norhydrocodone are expected    metabolites of hydrocodone. Hydromorphone and dihydrocodeine are    also available as scheduled prescription medications.  ==================================================================== Test                      Result    Flag   Units      Ref Range   Creatinine              129               mg/dL      >=04 ==================================================================== Declared Medications:  The flagging and interpretation on this report are based on the  following declared medications.  Unexpected results may arise from  inaccuracies in the declared medications.   **Note: The testing scope of this panel  includes these medications:   Hydrocodone (Norco)   **Note: The testing scope of this panel does not include the  following reported medications:   Acetaminophen (Norco)  Allopurinol (Zyloprim)  Aspirin  Atorvastatin (Lipitor)  Cholecalciferol  Clopidogrel (Plavix)  Gabapentin (Neurontin)  Insulin Evaristo Bury)  Isosorbide (Imdur)  Losartan (Cozaar)  Metoprolol (Lopressor)  Multivitamin  Naloxone (Narcan)  Nitroglycerin (Nitrostat)  Pantoprazole (Protonix)  Potassium  Semaglutide (Ozempic)  Supplement  Valsartan (Diovan) ==================================================================== For clinical consultation, please ca ll [phone]. ====================================================================   Basic metabolic panel     Status: Abnormal   Collection Time: 02/22/23  8:42 PM  Result Value Ref Range   Sodium 138 135 - 145 mmol/L   Potassium 4.3 3.5 - 5.1 mmol/L   Chloride 108 98 - 111 mmol/L   CO2 23 22 - 32 mmol/L   Glucose, Bld 182 (H) 70 - 99 mg/dL    Comment: Glucose reference range applies only to samples taken after fasting for at least 8 hours.   BUN 23 8 - 23 mg/dL   Creatinine, Ser 9.14 (H) 0.61 - 1.24 mg/dL   Calcium 8.3 (L) 8.9 - 10.3 mg/dL   GFR, Estimated 44 (L) >60 mL/min    Comment: (NOTE) Calculated using the CKD-EPI Creatinine Equation (2021)    Anion gap 7 5 - 15    Comment: Performed at [facility], [address] [place], [place] [address]  CBC     Status: Abnormal   Collection Time: 02/22/23  8:42 PM  Result Value Ref Range   WBC 6.2 4.0 - 10.5 K/uL   RBC 4.06 (L) 4.22 - 5.81 MIL/uL   Hemoglobin 12.3 (L) 13.0 - 17.0 g/dL   HCT 62.1 (L) 30.8 - 65.7 %   MCV 89.9 80.0 - 100.0 fL   MCH 30.3 26.0 - 34.0 pg   MCHC 33.7 30.0 - 36.0 g/dL   RDW 84.6 96.2 - 95.2 %   Platelets 156 150 - 400 K/uL   nRBC 0.0 0.0 - 0.2 %    Comment: Performed at [facility], [address], [place], [place] [address]  Troponin I  (High Sensitivity)     Status: Abnormal   Collection Time: 02/22/23  8:42 PM  Result Value Ref Range   Troponin I (High Sensitivity) 18 (H) <18 ng/L    Comment: (NOTE) Elevated high sensitivity troponin I (hsTnI) values and significant  changes across serial measurements may suggest ACS but many other  chronic and acute conditions are known to elevate hsTnI results.  Refer to the "Links" section for chest pain algorithms and additional  guidance. Performed at  [facility], [address] [place], [place] [address]   Protime-INR (order if Patient is taking Coumadin / Warfarin)     Status: None   Collection Time: 02/22/23  8:42 PM  Result Value Ref Range   Prothrombin Time 15.0 11.4 - 15.2 seconds   INR 1.2 0.8 - 1.2    Comment: (NOTE) INR goal varies based on device and disease states. Performed at [facility], [address] [place], [place] [address]   Troponin I (High Sensitivity)     Status: Abnormal   Collection Time: 02/22/23 10:24 PM  Result Value Ref Range   Troponin I (High Sensitivity) 81 (H) <18 ng/L    Comment: RESULT CALLED TO, READ BACK BY AND VERIFIED WITH NINA GRANTHAM RN @ [phone] 02/22/23 (NOTE) Elevated high sensitivity troponin I (hsTnI) values and significant  changes across serial measurements may suggest ACS but many other  chronic and acute conditions are known to elevate hsTnI results.  Refer to the "Links" section for chest pain algorithms and additional  guidance. Performed at [facility], [address] [place], [place] [address]   APTT     Status: None   Collection Time: 02/23/23  1:05 AM  Result Value Ref Range   aPTT 34 24 - 36 seconds    Comment: Performed at [facility], [address] [place], [place] [address]  Protime-INR     Status: None   Collection Time: 02/23/23  1:05 AM  Result Value Ref Range   Prothrombin Time 14.9 11.4 - 15.2 seconds   INR 1.1 0.8 - 1.2    Comment: (NOTE) INR goal  varies based on device and disease states. Performed at [facility], [address] [place], [place] [address]   CBG monitoring, ED     Status: Abnormal   Collection Time: 02/23/23  3:27 AM  Result Value Ref Range   Glucose-Capillary 61 (L) 70 - 99 mg/dL    Comment: Glucose reference range applies only to samples taken after fasting for at least 8 hours.  CBG monitoring, ED     Status: None   Collection Time: 02/23/23  3:59 AM  Result Value Ref Range   Glucose-Capillary 79 70 - 99 mg/dL    Comment: Glucose reference range applies only to samples taken after fasting for at least 8 hours.  Hemoglobin A1c     Status: Abnormal   Collection Time: 02/23/23  4:26 AM  Result Value Ref Range   Hgb A1c MFr Bld 5.8 (H) 4.8 - 5.6 %    Comment: (NOTE) Pre diabetes:          5.7%-6.4%  Diabetes:              >6.4%  Glycemic control for   <7.0% adults with diabetes    Mean Plasma Glucose 119.76 mg/dL    Comment: Performed at [facility], [address], [place], [place] [address]  CBG monitoring, ED     Status: Abnormal   Collection Time: 02/23/23  7:52 AM  Result Value Ref Range   Glucose-Capillary 62 (L) 70 - 99 mg/dL    Comment: Glucose reference range applies only to samples taken after fasting for at least 8 hours.  Heparin level (unfractionated)     Status: None   Collection Time: 02/23/23  8:02 AM  Result Value Ref Range   Heparin Unfractionated 0.33 0.30 - 0.70 IU/mL    Comment: (NOTE) The clinical reportable range upper  limit is being lowered to >1.10 to align with the FDA approved guidance for the current laboratory assay.  If heparin results are below expected values, and patient dosage has  been confirmed, suggest follow up testing of antithrombin III levels. Performed at [facility], [address] [place], [place] [address]   CBG monitoring, ED     Status: Abnormal   Collection Time: 02/23/23  9:04 AM  Result Value Ref Range    Glucose-Capillary 101 (H) 70 - 99 mg/dL    Comment: Glucose reference range applies only to samples taken after fasting for at least 8 hours.  CBG monitoring, ED     Status: Abnormal   Collection Time: 02/23/23 12:09 PM  Result Value Ref Range   Glucose-Capillary 128 (H) 70 - 99 mg/dL    Comment: Glucose reference range applies only to samples taken after fasting for at least 8 hours.  Heparin level (unfractionated)     Status: Abnormal   Collection Time: 02/23/23  2:08 PM  Result Value Ref Range   Heparin Unfractionated 0.29 (L) 0.30 - 0.70 IU/mL    Comment: (NOTE) The clinical reportable range upper limit is being lowered to >1.10 to align with the FDA approved guidance for the current laboratory assay.  If heparin results are below expected values, and patient dosage has  been confirmed, suggest follow up testing of antithrombin III levels. Performed at [facility], [address] [place], [place] [address]   Glucose, capillary     Status: None   Collection Time: 02/23/23  3:46 PM  Result Value Ref Range   Glucose-Capillary 83 70 - 99 mg/dL    Comment: Glucose reference range applies only to samples taken after fasting for at least 8 hours.  Glucose, capillary     Status: Abnormal   Collection Time: 02/23/23  8:07 PM  Result Value Ref Range   Glucose-Capillary 143 (H) 70 - 99 mg/dL    Comment: Glucose reference range applies only to samples taken after fasting for at least 8 hours.  Heparin level (unfractionated)     Status: None   Collection Time: 02/23/23  9:47 PM  Result Value Ref Range   Heparin Unfractionated 0.38 0.30 - 0.70 IU/mL    Comment: (NOTE) The clinical reportable range upper limit is being lowered to >1.10 to align with the FDA approved guidance for the current laboratory assay.  If heparin results are below expected values, and patient dosage has  been confirmed, suggest follow up testing of antithrombin III levels. Performed at [facility], [address] [place], [place] [address]   Glucose, capillary     Status: None   Collection Time: 02/23/23 11:42 PM  Result Value Ref Range   Glucose-Capillary 82 70 - 99 mg/dL    Comment: Glucose reference range applies only to samples taken after fasting for at least 8 hours.  Glucose, capillary     Status: Abnormal   Collection Time: 02/24/23  3:44 AM  Result Value Ref Range   Glucose-Capillary 104 (H) 70 - 99 mg/dL    Comment: Glucose reference range applies only to samples taken after fasting for at least 8 hours.  CBC     Status: Abnormal   Collection Time: 02/24/23  4:06 AM  Result Value Ref Range   WBC 6.2 4.0 - 10.5 K/uL   RBC 3.69 (L) 4.22 - 5.81 MIL/uL   Hemoglobin 11.3 (L) 13.0 - 17.0 g/dL   HCT 95.6 (L) 21.3 -  52.0 %   MCV 89.2 80.0 - 100.0 fL   MCH 30.6 26.0 - 34.0 pg   MCHC 34.3 30.0 - 36.0 g/dL   RDW 46.9 62.9 - 52.8 %   Platelets 127 (L) 150 - 400 K/uL   nRBC 0.0 0.0 - 0.2 %    Comment: Performed at [facility], [address] [place], [place] [address]  Heparin level (unfractionated)     Status: None   Collection Time: 02/24/23  4:06 AM  Result Value Ref Range   Heparin Unfractionated 0.38 0.30 - 0.70 IU/mL    Comment: (NOTE) The clinical reportable range upper limit is being lowered to >1.10 to align with the FDA approved guidance for the current laboratory assay.  If heparin results are below expected values, and patient dosage has  been confirmed, suggest follow up testing of antithrombin III levels. Performed at [facility], [address] [place], [place] [address]   Glucose, capillary     Status: None   Collection Time: 02/24/23  7:48 AM  Result Value Ref Range   Glucose-Capillary 78 70 - 99 mg/dL    Comment: Glucose reference range applies only to samples taken after fasting for at least 8 hours.  Glucose, capillary     Status: Abnormal   Collection Time: 02/24/23  1:12 PM  Result Value Ref Range    Glucose-Capillary 119 (H) 70 - 99 mg/dL    Comment: Glucose reference range applies only to samples taken after fasting for at least 8 hours.  Glucose, capillary     Status: None   Collection Time: 02/24/23  5:29 PM  Result Value Ref Range   Glucose-Capillary 95 70 - 99 mg/dL    Comment: Glucose reference range applies only to samples taken after fasting for at least 8 hours.  Glucose, capillary     Status: Abnormal   Collection Time: 02/24/23  7:28 PM  Result Value Ref Range   Glucose-Capillary 138 (H) 70 - 99 mg/dL    Comment: Glucose reference range applies only to samples taken after fasting for at least 8 hours.  Glucose, capillary     Status: Abnormal   Collection Time: 02/24/23 11:42 PM  Result Value Ref Range   Glucose-Capillary 63 (L) 70 - 99 mg/dL    Comment: Glucose reference range applies only to samples taken after fasting for at least 8 hours.  Glucose, capillary     Status: None   Collection Time: 02/25/23 12:04 AM  Result Value Ref Range   Glucose-Capillary 82 70 - 99 mg/dL    Comment: Glucose reference range applies only to samples taken after fasting for at least 8 hours.  Glucose, capillary     Status: None   Collection Time: 02/25/23  3:28 AM  Result Value Ref Range   Glucose-Capillary 86 70 - 99 mg/dL    Comment: Glucose reference range applies only to samples taken after fasting for at least 8 hours.  Heparin level (unfractionated)     Status: Abnormal   Collection Time: 02/25/23  3:37 AM  Result Value Ref Range   Heparin Unfractionated 0.76 (H) 0.30 - 0.70 IU/mL    Comment: (NOTE) The clinical reportable range upper limit is being lowered to >1.10 to align with the FDA approved guidance for the current laboratory assay.  If heparin results are below expected values, and patient dosage has  been confirmed, suggest follow up testing of antithrombin III levels. Performed at [facility], [address] [place] [place], [place] [address]   CBC      Status: Abnormal   Collection Time: 02/25/23  3:37 AM  Result Value Ref Range   WBC 6.1 4.0 - 10.5 K/uL   RBC 3.84 (L) 4.22 - 5.81 MIL/uL   Hemoglobin 11.6 (L) 13.0 - 17.0 g/dL   HCT 40.1 (L) 02.7 - 25.3 %   MCV 88.5 80.0 - 100.0 fL   MCH 30.2 26.0 - 34.0 pg   MCHC 34.1 30.0 - 36.0 g/dL   RDW 66.4 40.3 - 47.4 %   Platelets 131 (L) 150 - 400 K/uL   nRBC 0.0 0.0 - 0.2 %    Comment: Performed at [facility], [address], [place], [place] [address]  Basic metabolic panel     Status: Abnormal   Collection Time: 02/25/23  3:37 AM  Result Value Ref Range   Sodium 139 135 - 145 mmol/L   Potassium 3.9 3.5 - 5.1 mmol/L   Chloride 108 98 - 111 mmol/L   CO2 23 22 - 32 mmol/L   Glucose, Bld 84 70 - 99 mg/dL    Comment: Glucose reference range applies only to samples taken after fasting for at least 8 hours.   BUN 18 8 - 23 mg/dL   Creatinine, Ser 3.87 0.61 - 1.24 mg/dL   Calcium 8.8 (L) 8.9 - 10.3 mg/dL   GFR, Estimated >56 >43 mL/min    Comment: (NOTE) Calculated using the CKD-EPI Creatinine Equation (2021)    Anion gap 8 5 - 15    Comment: Performed at [facility], [address] [place], [place] [address]  Glucose, capillary     Status: None   Collection Time: 02/25/23  7:37 AM  Result Value Ref Range   Glucose-Capillary 91 70 - 99 mg/dL    Comment: Glucose reference range applies only to samples taken after fasting for at least 8 hours.  Glucose, capillary     Status: None   Collection Time: 02/25/23 12:45 PM  Result Value Ref Range   Glucose-Capillary 92 70 - 99 mg/dL    Comment: Glucose reference range applies only to samples taken after fasting for at least 8 hours.  NM Myocar Multi W/Spect W/Wall Motion / EF     Status: None   Collection Time: 02/25/23  1:03 PM  Result Value Ref Range   Rest Nuclear Isotope Dose 10.1 mCi   Stress Nuclear Isotope Dose 30.1 mCi   SSS 0.0    SRS 3.0    SDS 1.0    TID 1.22    LV sys vol 44.0 mL   LV dias vol 98.0  62 - 150 mL   Nuc Stress EF 55 %   Base ST Depression (mm) 0 mm   ST Depression (mm) 0 mm  ECHOCARDIOGRAM COMPLETE     Status: None   Collection Time: 02/25/23  2:28 PM  Result Value Ref Range   Weight 2,640.02 oz   Height 63 in   BP 143/71 mmHg   Ao pk vel 2.44 m/s   AV Area VTI 1.08 cm2   AR max vel 1.25 cm2   AV Mean grad 15.3 mmHg   AV Peak grad 23.8 mmHg   S' Lateral 3.10 cm   AV Area mean vel 1.12 cm2   Area-P 1/2 4.71 cm2   MV VTI 1.75 cm2   Est EF 50 - 55%   Surgical PCR screen     Status: None   Collection  Time: 04/07/23  2:00 AM   Specimen: Nasal Mucosa; Nasal Swab  Result Value Ref Range   MRSA, PCR NEGATIVE NEGATIVE   Staphylococcus aureus NEGATIVE NEGATIVE    Comment: (NOTE) The Xpert SA Assay (FDA approved for NASAL specimens in patients 50 years of age and older), is one component of a comprehensive surveillance program. It is not intended to diagnose infection nor to guide or monitor treatment. Performed at [facility], [address] [place], [place] [address]   CBC with Differential     Status: None   Collection Time: 04/07/23  5:30 AM  Result Value Ref Range   WBC 6.7 4.0 - 10.5 K/uL   RBC 4.58 4.22 - 5.81 MIL/uL   Hemoglobin 13.9 13.0 - 17.0 g/dL   HCT 60.4 54.0 - 98.1 %   MCV 87.6 80.0 - 100.0 fL   MCH 30.3 26.0 - 34.0 pg   MCHC 34.7 30.0 - 36.0 g/dL   RDW 19.1 47.8 - 29.5 %   Platelets 157 150 - 400 K/uL   nRBC 0.0 0.0 - 0.2 %   Neutrophils Relative % 63 %   Neutro Abs 4.2 1.7 - 7.7 K/uL   Lymphocytes Relative 26 %   Lymphs Abs 1.8 0.7 - 4.0 K/uL   Monocytes Relative 9 %   Monocytes Absolute 0.6 0.1 - 1.0 K/uL   Eosinophils Relative 2 %   Eosinophils Absolute 0.2 0.0 - 0.5 K/uL   Basophils Relative 0 %   Basophils Absolute 0.0 0.0 - 0.1 K/uL   Immature Granulocytes 0 %   Abs Immature Granulocytes 0.01 0.00 - 0.07 K/uL    Comment: Performed at [facility], [address] [place], [place] [address]  Comprehensive  metabolic panel     Status: Abnormal   Collection Time: 04/07/23  5:30 AM  Result Value Ref Range   Sodium 137 135 - 145 mmol/L   Potassium 4.0 3.5 - 5.1 mmol/L   Chloride 104 98 - 111 mmol/L   CO2 22 22 - 32 mmol/L   Glucose, Bld 112 (H) 70 - 99 mg/dL    Comment: Glucose reference range applies only to samples taken after fasting for at least 8 hours.   BUN 29 (H) 8 - 23 mg/dL   Creatinine, Ser 8.65 0.61 - 1.24 mg/dL   Calcium 9.0 8.9 - 78.4 mg/dL   Total Protein 7.9 6.5 - 8.1 g/dL   Albumin 4.3 3.5 - 5.0 g/dL   AST 53 (H) 15 - 41 U/L   ALT 65 (H) 0 - 44 U/L   Alkaline Phosphatase 85 38 - 126 U/L   Total Bilirubin 0.7 0.3 - 1.2 mg/dL   GFR, Estimated >69 >62 mL/min    Comment: (NOTE) Calculated using the CKD-EPI Creatinine Equation (2021)    Anion gap 11 5 - 15    Comment: Performed at [facility], [address] [place], [place] [address]  Brain natriuretic peptide     Status: Abnormal   Collection Time: 04/07/23  5:30 AM  Result Value Ref Range   B Natriuretic Peptide 228.3 (H) 0.0 - 100.0 pg/mL    Comment: Performed at [facility], [address] [place], [place] [address]  Troponin I (High Sensitivity)     Status: Abnormal   Collection Time: 04/07/23  5:30 AM  Result Value Ref Range   Troponin I (High Sensitivity) 26 (H) <18 ng/L    Comment: (NOTE) Elevated high sensitivity troponin I (hsTnI) values  and significant  changes across serial measurements may suggest ACS but many other  chronic and acute conditions are known to elevate hsTnI results.  Refer to the "Links" section for chest pain algorithms and additional  guidance. Performed at [facility], [address] [place], [place] [address]   Protime-INR     Status: None   Collection Time: 04/07/23  5:30 AM  Result Value Ref Range   Prothrombin Time 14.7 11.4 - 15.2 seconds   INR 1.1 0.8 - 1.2    Comment: (NOTE) INR goal varies based on device and disease states. Performed at  [facility], [address] [place], [place] [address]   APTT     Status: None   Collection Time: 04/07/23  5:30 AM  Result Value Ref Range   aPTT 36 24 - 36 seconds    Comment: Performed at [facility], [address] [place], [place] [address]  Troponin I (High Sensitivity)     Status: Abnormal   Collection Time: 04/07/23  7:50 AM  Result Value Ref Range   Troponin I (High Sensitivity) 52 (H) <18 ng/L    Comment: RESULT CALLED TO, READ BACK BY AND VERIFIED WITH Judithe Modest [facility] 04/07/2023 AT 0825 SRR (NOTE) Elevated high sensitivity troponin I (hsTnI) values and significant  changes across serial measurements may suggest ACS but many other  chronic and acute conditions are known to elevate hsTnI results.  Refer to the "Links" section for chest pain algorithms and additional  guidance. Performed at [facility], [address] [place], [place] [address]   Ethanol     Status: None   Collection Time: 04/07/23  9:43 AM  Result Value Ref Range   Alcohol, Ethyl (B) <10 <10 mg/dL    Comment: (NOTE) Lowest detectable limit for serum alcohol is 10 mg/dL.  For medical purposes only. Performed at [facility], [address] [place], [place] [address]   Troponin I (High Sensitivity)     Status: Abnormal   Collection Time: 04/07/23  9:43 AM  Result Value Ref Range   Troponin I (High Sensitivity) 73 (H) <18 ng/L    Comment: (NOTE) Elevated high sensitivity troponin I (hsTnI) values and significant  changes across serial measurements may suggest ACS but many other  chronic and acute conditions are known to elevate hsTnI results.  Refer to the "Links" section for chest pain algorithms and additional  guidance. Performed at [facility], [address] [place], [place] [address]   CBG monitoring, ED     Status: None   Collection Time: 04/07/23 11:22 AM  Result Value Ref Range   Glucose-Capillary 87 70 - 99 mg/dL    Comment:  Glucose reference range applies only to samples taken after fasting for at least 8 hours.  Troponin I (High Sensitivity)     Status: Abnormal   Collection Time: 04/07/23 12:20 PM  Result Value Ref Range   Troponin I (High Sensitivity) 139 (HH) <18 ng/L    Comment: CRITICAL RESULT CALLED TO, READ BACK BY AND VERIFIED WITH Judithe Modest [facility] 04/07/2023 AT 1322 SRR (NOTE) Elevated high sensitivity troponin I (hsTnI) values and significant  changes across serial measurements may suggest ACS but many other  chronic and acute conditions are known to elevate hsTnI results.  Refer to the "Links" section for chest pain algorithms and additional  guidance. Performed at [facility], [address] [place], [place] [address]   Heparin level (unfractionated)  Status: Abnormal   Collection Time: 04/07/23  1:02 PM  Result Value Ref Range   Heparin Unfractionated 0.24 (L) 0.30 - 0.70 IU/mL    Comment: (NOTE) The clinical reportable range upper limit is being lowered to >1.10 to align with the FDA approved guidance for the current laboratory assay.  If heparin results are below expected values, and patient dosage has  been confirmed, suggest follow up testing of antithrombin III levels. Performed at [facility], [address] [place], [place] [address]   CBG monitoring, ED     Status: Abnormal   Collection Time: 04/07/23  4:51 PM  Result Value Ref Range   Glucose-Capillary 104 (H) 70 - 99 mg/dL    Comment: Glucose reference range applies only to samples taken after fasting for at least 8 hours.  Heparin level (unfractionated)     Status: Abnormal   Collection Time: 04/07/23  7:55 PM  Result Value Ref Range   Heparin Unfractionated 0.18 (L) 0.30 - 0.70 IU/mL    Comment: (NOTE) The clinical reportable range upper limit is being lowered to >1.10 to align with the FDA approved guidance for the current laboratory assay.  If heparin results are below expected values, and  patient dosage has  been confirmed, suggest follow up testing of antithrombin III levels. Performed at [facility], [address] [place], [place] [address]   Glucose, capillary     Status: None   Collection Time: 04/08/23  1:30 AM  Result Value Ref Range   Glucose-Capillary 73 70 - 99 mg/dL    Comment: Glucose reference range applies only to samples taken after fasting for at least 8 hours.  CBC     Status: Abnormal   Collection Time: 04/08/23  3:06 AM  Result Value Ref Range   WBC 7.9 4.0 - 10.5 K/uL   RBC 4.36 4.22 - 5.81 MIL/uL   Hemoglobin 13.3 13.0 - 17.0 g/dL   HCT 60.6 (L) 30.1 - 60.1 %   MCV 86.2 80.0 - 100.0 fL   MCH 30.5 26.0 - 34.0 pg   MCHC 35.4 30.0 - 36.0 g/dL   RDW 09.3 23.5 - 57.3 %   Platelets 151 150 - 400 K/uL   nRBC 0.0 0.0 - 0.2 %    Comment: Performed at [facility], [address] [place], [place] [address]  Heparin level (unfractionated)     Status: None   Collection Time: 04/08/23  3:06 AM  Result Value Ref Range   Heparin Unfractionated 0.52 0.30 - 0.70 IU/mL    Comment: (NOTE) The clinical reportable range upper limit is being lowered to >1.10 to align with the FDA approved guidance for the current laboratory assay.  If heparin results are below expected values, and patient dosage has  been confirmed, suggest follow up testing of antithrombin III levels. Performed at [facility], [address] [place], [place] [address]   Glucose, capillary     Status: None   Collection Time: 04/08/23  8:49 AM  Result Value Ref Range   Glucose-Capillary 91 70 - 99 mg/dL    Comment: Glucose reference range applies only to samples taken after fasting for at least 8 hours.  Heparin level (unfractionated)     Status: None   Collection Time: 04/08/23  9:09 AM  Result Value Ref Range   Heparin Unfractionated 0.43 0.30 - 0.70 IU/mL    Comment: (NOTE) The clinical reportable range upper limit is being lowered to >1.10 to align  with the FDA approved guidance for the current laboratory assay.  If heparin results are below expected values, and patient dosage has  been confirmed, suggest follow up testing of antithrombin III levels. Performed at [facility], [address] [place], [place] [address]   Basic metabolic panel     Status: Abnormal   Collection Time: 04/08/23  9:09 AM  Result Value Ref Range   Sodium 136 135 - 145 mmol/L   Potassium 4.2 3.5 - 5.1 mmol/L   Chloride 104 98 - 111 mmol/L   CO2 23 22 - 32 mmol/L   Glucose, Bld 68 (L) 70 - 99 mg/dL    Comment: Glucose reference range applies only to samples taken after fasting for at least 8 hours.   BUN 27 (H) 8 - 23 mg/dL   Creatinine, Ser 6.04 0.61 - 1.24 mg/dL   Calcium 9.0 8.9 - 54.0 mg/dL   GFR, Estimated >98 >11 mL/min    Comment: (NOTE) Calculated using the CKD-EPI Creatinine Equation (2021)    Anion gap 9 5 - 15    Comment: Performed at [facility], [address] [place], [place] [address]  POCT I-Stat EG7     Status: Abnormal   Collection Time: 04/08/23  2:27 PM  Result Value Ref Range   pH, Ven 7.382 7.25 - 7.43   pCO2, Ven 41.8 (L) 44 - 60 mmHg   pO2, Ven 35 32 - 45 mmHg   Bicarbonate 24.8 20.0 - 28.0 mmol/L   TCO2 26 22 - 32 mmol/L   O2 Saturation 66 %   Acid-Base Excess 0.0 0.0 - 2.0 mmol/L   Sodium 138 135 - 145 mmol/L   Potassium 4.2 3.5 - 5.1 mmol/L   Calcium, Ion 1.25 1.15 - 1.40 mmol/L   HCT 37.0 (L) 39.0 - 52.0 %   Hemoglobin 12.6 (L) 13.0 - 17.0 g/dL   Sample type MIXED VENOUS SAMPLE    Comment NOTIFIED PHYSICIAN   I-STAT 7, (LYTES, BLD GAS, ICA, H+H)     Status: Abnormal   Collection Time: 04/08/23  2:28 PM  Result Value Ref Range   pH, Arterial 7.425 7.35 - 7.45   pCO2 arterial 31.2 (L) 32 - 48 mmHg   pO2, Arterial 84 83 - 108 mmHg   Bicarbonate 20.5 20.0 - 28.0 mmol/L   TCO2 21 (L) 22 - 32 mmol/L   O2 Saturation 97 %   Acid-base deficit 3.0 (H) 0.0 - 2.0 mmol/L   Sodium 140 135 - 145  mmol/L   Potassium 3.9 3.5 - 5.1 mmol/L   Calcium, Ion 1.12 (L) 1.15 - 1.40 mmol/L   HCT 35.0 (L) 39.0 - 52.0 %   Hemoglobin 11.9 (L) 13.0 - 17.0 g/dL   Sample type ARTERIAL   Glucose, capillary     Status: Abnormal   Collection Time: 04/08/23  5:58 PM  Result Value Ref Range   Glucose-Capillary 66 (L) 70 - 99 mg/dL    Comment: Glucose reference range applies only to samples taken after fasting for at least 8 hours.  Glucose, capillary     Status: Abnormal   Collection Time: 04/08/23  8:57 PM  Result Value Ref Range   Glucose-Capillary 300 (H) 70 - 99 mg/dL    Comment: Glucose reference range applies only to samples taken after fasting for at least 8 hours.  CBC     Status: Abnormal   Collection Time: 04/09/23  4:28 AM  Result Value Ref Range   WBC  7.0 4.0 - 10.5 K/uL   RBC 4.22 4.22 - 5.81 MIL/uL   Hemoglobin 12.7 (L) 13.0 - 17.0 g/dL   HCT 76.1 (L) 60.7 - 37.1 %   MCV 85.5 80.0 - 100.0 fL   MCH 30.1 26.0 - 34.0 pg   MCHC 35.2 30.0 - 36.0 g/dL   RDW 06.2 69.4 - 85.4 %   Platelets 155 150 - 400 K/uL   nRBC 0.0 0.0 - 0.2 %    Comment: Performed at [facility], [address] [place], [place] [address]  Glucose, capillary     Status: None   Collection Time: 04/09/23  8:49 AM  Result Value Ref Range   Glucose-Capillary 92 70 - 99 mg/dL    Comment: Glucose reference range applies only to samples taken after fasting for at least 8 hours.  Glucose, capillary     Status: Abnormal   Collection Time: 04/09/23  1:06 PM  Result Value Ref Range   Glucose-Capillary 220 (H) 70 - 99 mg/dL    Comment: Glucose reference range applies only to samples taken after fasting for at least 8 hours.  Glucose, capillary     Status: Abnormal   Collection Time: 04/09/23  4:14 PM  Result Value Ref Range   Glucose-Capillary 137 (H) 70 - 99 mg/dL    Comment: Glucose reference range applies only to samples taken after fasting for at least 8 hours.    Radiology CARDIAC  CATHETERIZATION  Result Date: 04/08/2023   Mid LM to Dist LM lesion is 75% stenosed.   Ost Cx to Prox Cx lesion is 95% stenosed.   Mid LAD lesion is 80% stenosed.   Prox RCA lesion is 75% stenosed.   Mid RCA lesion is 70% stenosed.   1st Mrg lesion is 100% stenosed.   2nd Mrg lesion is 60% stenosed.   Origin lesion is 100% stenosed.   There is mild left ventricular systolic dysfunction.   The left ventricular ejection fraction is 45-50% by visual estimate. 1.  Three-vessel coronary artery disease with 75% stenosis distal left main/ostial LAD, 80% stenosis mid LAD, 95% stenosis ostial/proximal left circumflex, occluded OM1, 60% stenosis OM2, 75% stenosis proximal RCA and 70% stenosis mid RCA with occluded RIMA graft to LAD, patent SVG to left PDA, patent SVG to OM 2 2.  Mild reduced left ventricular function with estimated LV ejection fraction 45 to 50% 3.  Mild aortic stenosis with calculated aortic valve area 1.24 cm, mean gradient 24.1 mmHg Recommendations 1.  Continue dual antiplatelet therapy 2.  Continue aggressive risk factor modification 3.  Add Ranexa 500 mg twice daily 4.  Obtain second opinion [facility] ) about potential complex PCI of LAD    Assessment/Plan Essential hypertension blood pressure control important in reducing the progression of atherosclerotic disease. On appropriate oral medications.     DM2 (diabetes mellitus, type 2) (HCC) blood glucose control important in reducing the progression of atherosclerotic disease. Also, involved in wound healing. On appropriate medications.     Hyperlipidemia lipid control important in reducing the progression of atherosclerotic disease. Continue statin therapy  Atherosclerosis of native arteries of extremity with intermittent claudication (HCC) His ABIs today are stable to slightly improved at 1.10 on the right and 0.83 on the left with digit pressures of 136 on the right and 110 on the left.  Continue antiplatelet therapy and statin agent.  We  can now follow this on an annual basis.    Festus Barren, MD  05/07/2023 2:03  PM    This note was created with Dragon medical transcription system.  Any errors from dictation are purely unintentional

## 2023-05-07 NOTE — Assessment & Plan Note (Signed)
His ABIs today are stable to slightly improved at 1.10 on the right and 0.83 on the left with digit pressures of 136 on the right and 110 on the left.  Continue antiplatelet therapy and statin agent.  We can now follow this on an annual basis.

## 2023-05-09 LAB — VAS US ABI WITH/WO TBI
Left ABI: 0.83
Right ABI: 1.1

## 2023-06-09 NOTE — Progress Notes (Unsigned)
PROVIDER NOTE: Information contained herein reflects review and annotations entered in association with encounter. Interpretation of such information and data should be left to medically-trained personnel. Information provided to patient can be located elsewhere in the medical record under "Patient Instructions". Document created using STT-dictation technology, any transcriptional errors that may result from process are unintentional.    Patient: [name] [name].  Service Category: E/M  Provider: Oswaldo Done, MD  DOB: Mar 03, 1956  DOS: 06/10/2023  Referring Provider: Shane Crutch, PA  MRN: [id number]  Specialty: Interventional Pain Management  PCP: Shane Crutch, PA  Type: Established Patient  Setting: Ambulatory outpatient    Location: Office  Delivery: Face-to-face     HPI  Mr. [name] Derrick., a 67 y.o. year old male, is here today because of his No primary diagnosis found.. [name] primary complain today is No chief complaint on file.  Pertinent problems: [name] has Chronic shoulder pain (3ry area of Pain) (Right); Chronic hip pain (Left); Chronic chest wall pain (1ry area of Pain) (Incisional Midline) (since 04/22/2012); Chronic lower extremity pain (2ry area of Pain) (Bilateral) (R>L); Chronic sacroiliac joint pain (Bilateral) (L>R); Neuropathic pain; Neurogenic pain; Incisional pain; Lumbar facet syndrome (Bilateral) (R>L); Lumbar spondylosis; Keloid skin disorder; Costochondritis; Brachial plexus injury, right; Right sided weakness; Chronic pain syndrome; DDD (degenerative disc disease), lumbosacral; Lumbar facet arthropathy (Multilevel) (Bilateral); 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 Pain Assessment: Severity of   is  reported as a  /10. Location:    / . Onset:  . Quality:  . Timing:  . Modifying factor(s):  Marland Kitchen Vitals:  vitals were not taken for this visit.  BMI: Estimated body mass index is 28.77 kg/m as calculated from the following:   Height as of 04/07/23: 5\' 3"  (1.6 m).   Weight as of 05/07/23: 162 lb 6.4 oz (73.7 kg). Last encounter: 03/18/2023. Last procedure: Visit date not found.  Reason for encounter: medication management. ***  Pharmacotherapy Assessment  Analgesic: Hydrocodone/APAP 5/325 mg, 1 tab PO BID (10 mg/day of hydrocodone) MME/day: 10 mg/day.   Monitoring: [place] PMP: PDMP reviewed during this encounter.       Pharmacotherapy: No side-effects or adverse reactions reported. Compliance: No problems identified. Effectiveness: Clinically acceptable.  No notes on file  No results found for: "CBDTHCR" No results found for: "D8THCCBX" No results found for: "D9THCCBX"  UDS:  Summary  Date Value Ref Range Status  02/11/2023 Note  Final    Comment:    ==================================================================== [facility] (MW) ==================================================================== Test                             Result       Flag       Units  Drug Present and Declared for Prescription Verification   Hydrocodone                    1779         EXPECTED   ng/mg creat   Hydromorphone                  394          EXPECTED   ng/mg creat   Dihydrocodeine  Date   BUN 27 (H) 04/08/2023   CREATININE 1.07 04/08/2023   GFRAA >60 05/07/2019   GFRNONAA >60 04/08/2023    Hepatic Lab Results  Component Value Date   AST 53 (H) 04/07/2023   ALT 65 (H) 04/07/2023   ALBUMIN 4.3 04/07/2023   ALKPHOS 85 04/07/2023   HCVAB NON REACTIVE 10/09/2020   LIPASE 39 07/16/2021    Electrolytes Lab Results  Component Value Date   NA 140 04/08/2023   K 3.9 04/08/2023   CL 104 04/08/2023   CALCIUM 9.0 04/08/2023   MG 2.2 10/09/2020   PHOS 3.7 10/09/2020    Bone Lab  Results  Component Value Date   VD25OH 74.7 05/07/2019   VD125OH2TOT 30.8 10/26/2015    Inflammation (CRP: Acute Phase) (ESR: Chronic Phase) Lab Results  Component Value Date   CRP 0.9 05/07/2019   ESRSEDRATE 41 (H) 05/07/2019   LATICACIDVEN 1.9 10/09/2020         Note: Above Lab results reviewed.  Recent Imaging Review  VAS [place] ABI WITH/WO TBI  LOWER EXTREMITY DOPPLER STUDY  Patient Name:  [name] FRIX.  Date of Exam:   05/07/2023 Medical Rec #: [id number]           Accession #:    [id number] Date of Birth: [birth year]            Patient Gender: M Patient Age:   1 years Exam Location:  [facility] Procedure:      VAS [place] ABI WITH/WO TBI Referring Phys: Sheppard Plumber  --------------------------------------------------------------------------------   Indications: Peripheral artery disease.  High Risk Factors: Hyperlipidemia, Diabetes, past history of smoking, prior MI,                    coronary artery disease.   Vascular Interventions: 11/20/21: Bilateral CIA stents;.  Performing Technologist: Hardie Lora RVT    Examination Guidelines: A complete evaluation includes at minimum, Doppler waveform signals and systolic blood pressure reading at the level of bilateral brachial, anterior tibial, and posterior tibial arteries, when vessel segments are accessible. Bilateral testing is considered an integral part of a complete examination. Photoelectric Plethysmograph (PPG) waveforms and toe systolic pressure readings are included as required and additional duplex testing as needed. Limited examinations for reoccurring indications may be performed as noted.    ABI Findings: +---------+------------------+-----+---------+--------+ Right    Rt Pressure (mmHg)IndexWaveform Comment  +---------+------------------+-----+---------+--------+ Brachial 145                                      +---------+------------------+-----+---------+--------+ PTA       160               1.10 triphasic         +---------+------------------+-----+---------+--------+ DP       152               1.05 triphasic         +---------+------------------+-----+---------+--------+ Great Toe136               0.94                   +---------+------------------+-----+---------+--------+  +---------+------------------+-----+----------+-------+ Left     Lt Pressure (mmHg)IndexWaveform  Comment +---------+------------------+-----+----------+-------+ Brachial 142                                      +---------+------------------+-----+----------+-------+  PROVIDER NOTE: Information contained herein reflects review and annotations entered in association with encounter. Interpretation of such information and data should be left to medically-trained personnel. Information provided to patient can be located elsewhere in the medical record under "Patient Instructions". Document created using STT-dictation technology, any transcriptional errors that may result from process are unintentional.    Patient: [name] [name].  Service Category: E/M  Provider: Oswaldo Done, MD  DOB: Mar 03, 1956  DOS: 06/10/2023  Referring Provider: Shane Crutch, PA  MRN: [id number]  Specialty: Interventional Pain Management  PCP: Shane Crutch, PA  Type: Established Patient  Setting: Ambulatory outpatient    Location: Office  Delivery: Face-to-face     HPI  Mr. [name] Derrick., a 67 y.o. year old male, is here today because of his No primary diagnosis found.. [name] primary complain today is No chief complaint on file.  Pertinent problems: [name] has Chronic shoulder pain (3ry area of Pain) (Right); Chronic hip pain (Left); Chronic chest wall pain (1ry area of Pain) (Incisional Midline) (since 04/22/2012); Chronic lower extremity pain (2ry area of Pain) (Bilateral) (R>L); Chronic sacroiliac joint pain (Bilateral) (L>R); Neuropathic pain; Neurogenic pain; Incisional pain; Lumbar facet syndrome (Bilateral) (R>L); Lumbar spondylosis; Keloid skin disorder; Costochondritis; Brachial plexus injury, right; Right sided weakness; Chronic pain syndrome; DDD (degenerative disc disease), lumbosacral; Lumbar facet arthropathy (Multilevel) (Bilateral); 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 Pain Assessment: Severity of   is  reported as a  /10. Location:    / . Onset:  . Quality:  . Timing:  . Modifying factor(s):  Marland Kitchen Vitals:  vitals were not taken for this visit.  BMI: Estimated body mass index is 28.77 kg/m as calculated from the following:   Height as of 04/07/23: 5\' 3"  (1.6 m).   Weight as of 05/07/23: 162 lb 6.4 oz (73.7 kg). Last encounter: 03/18/2023. Last procedure: Visit date not found.  Reason for encounter: medication management. ***  Pharmacotherapy Assessment  Analgesic: Hydrocodone/APAP 5/325 mg, 1 tab PO BID (10 mg/day of hydrocodone) MME/day: 10 mg/day.   Monitoring: [place] PMP: PDMP reviewed during this encounter.       Pharmacotherapy: No side-effects or adverse reactions reported. Compliance: No problems identified. Effectiveness: Clinically acceptable.  No notes on file  No results found for: "CBDTHCR" No results found for: "D8THCCBX" No results found for: "D9THCCBX"  UDS:  Summary  Date Value Ref Range Status  02/11/2023 Note  Final    Comment:    ==================================================================== [facility] (MW) ==================================================================== Test                             Result       Flag       Units  Drug Present and Declared for Prescription Verification   Hydrocodone                    1779         EXPECTED   ng/mg creat   Hydromorphone                  394          EXPECTED   ng/mg creat   Dihydrocodeine  PROVIDER NOTE: Information contained herein reflects review and annotations entered in association with encounter. Interpretation of such information and data should be left to medically-trained personnel. Information provided to patient can be located elsewhere in the medical record under "Patient Instructions". Document created using STT-dictation technology, any transcriptional errors that may result from process are unintentional.    Patient: [name] [name].  Service Category: E/M  Provider: Oswaldo Done, MD  DOB: Mar 03, 1956  DOS: 06/10/2023  Referring Provider: Shane Crutch, PA  MRN: [id number]  Specialty: Interventional Pain Management  PCP: Shane Crutch, PA  Type: Established Patient  Setting: Ambulatory outpatient    Location: Office  Delivery: Face-to-face     HPI  Mr. [name] Derrick., a 67 y.o. year old male, is here today because of his No primary diagnosis found.. [name] primary complain today is No chief complaint on file.  Pertinent problems: [name] has Chronic shoulder pain (3ry area of Pain) (Right); Chronic hip pain (Left); Chronic chest wall pain (1ry area of Pain) (Incisional Midline) (since 04/22/2012); Chronic lower extremity pain (2ry area of Pain) (Bilateral) (R>L); Chronic sacroiliac joint pain (Bilateral) (L>R); Neuropathic pain; Neurogenic pain; Incisional pain; Lumbar facet syndrome (Bilateral) (R>L); Lumbar spondylosis; Keloid skin disorder; Costochondritis; Brachial plexus injury, right; Right sided weakness; Chronic pain syndrome; DDD (degenerative disc disease), lumbosacral; Lumbar facet arthropathy (Multilevel) (Bilateral); 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 Pain Assessment: Severity of   is  reported as a  /10. Location:    / . Onset:  . Quality:  . Timing:  . Modifying factor(s):  Marland Kitchen Vitals:  vitals were not taken for this visit.  BMI: Estimated body mass index is 28.77 kg/m as calculated from the following:   Height as of 04/07/23: 5\' 3"  (1.6 m).   Weight as of 05/07/23: 162 lb 6.4 oz (73.7 kg). Last encounter: 03/18/2023. Last procedure: Visit date not found.  Reason for encounter: medication management. ***  Pharmacotherapy Assessment  Analgesic: Hydrocodone/APAP 5/325 mg, 1 tab PO BID (10 mg/day of hydrocodone) MME/day: 10 mg/day.   Monitoring: [place] PMP: PDMP reviewed during this encounter.       Pharmacotherapy: No side-effects or adverse reactions reported. Compliance: No problems identified. Effectiveness: Clinically acceptable.  No notes on file  No results found for: "CBDTHCR" No results found for: "D8THCCBX" No results found for: "D9THCCBX"  UDS:  Summary  Date Value Ref Range Status  02/11/2023 Note  Final    Comment:    ==================================================================== [facility] (MW) ==================================================================== Test                             Result       Flag       Units  Drug Present and Declared for Prescription Verification   Hydrocodone                    1779         EXPECTED   ng/mg creat   Hydromorphone                  394          EXPECTED   ng/mg creat   Dihydrocodeine  PROVIDER NOTE: Information contained herein reflects review and annotations entered in association with encounter. Interpretation of such information and data should be left to medically-trained personnel. Information provided to patient can be located elsewhere in the medical record under "Patient Instructions". Document created using STT-dictation technology, any transcriptional errors that may result from process are unintentional.    Patient: [name] [name].  Service Category: E/M  Provider: Oswaldo Done, MD  DOB: Mar 03, 1956  DOS: 06/10/2023  Referring Provider: Shane Crutch, PA  MRN: [id number]  Specialty: Interventional Pain Management  PCP: Shane Crutch, PA  Type: Established Patient  Setting: Ambulatory outpatient    Location: Office  Delivery: Face-to-face     HPI  Mr. [name] Derrick., a 67 y.o. year old male, is here today because of his No primary diagnosis found.. [name] primary complain today is No chief complaint on file.  Pertinent problems: [name] has Chronic shoulder pain (3ry area of Pain) (Right); Chronic hip pain (Left); Chronic chest wall pain (1ry area of Pain) (Incisional Midline) (since 04/22/2012); Chronic lower extremity pain (2ry area of Pain) (Bilateral) (R>L); Chronic sacroiliac joint pain (Bilateral) (L>R); Neuropathic pain; Neurogenic pain; Incisional pain; Lumbar facet syndrome (Bilateral) (R>L); Lumbar spondylosis; Keloid skin disorder; Costochondritis; Brachial plexus injury, right; Right sided weakness; Chronic pain syndrome; DDD (degenerative disc disease), lumbosacral; Lumbar facet arthropathy (Multilevel) (Bilateral); 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 Pain Assessment: Severity of   is  reported as a  /10. Location:    / . Onset:  . Quality:  . Timing:  . Modifying factor(s):  Marland Kitchen Vitals:  vitals were not taken for this visit.  BMI: Estimated body mass index is 28.77 kg/m as calculated from the following:   Height as of 04/07/23: 5\' 3"  (1.6 m).   Weight as of 05/07/23: 162 lb 6.4 oz (73.7 kg). Last encounter: 03/18/2023. Last procedure: Visit date not found.  Reason for encounter: medication management. ***  Pharmacotherapy Assessment  Analgesic: Hydrocodone/APAP 5/325 mg, 1 tab PO BID (10 mg/day of hydrocodone) MME/day: 10 mg/day.   Monitoring: [place] PMP: PDMP reviewed during this encounter.       Pharmacotherapy: No side-effects or adverse reactions reported. Compliance: No problems identified. Effectiveness: Clinically acceptable.  No notes on file  No results found for: "CBDTHCR" No results found for: "D8THCCBX" No results found for: "D9THCCBX"  UDS:  Summary  Date Value Ref Range Status  02/11/2023 Note  Final    Comment:    ==================================================================== [facility] 13 (MW) ==================================================================== Test                             Result       Flag       Units  Drug Present and Declared for Prescription Verification   Hydrocodone                    1779         EXPECTED   ng/mg creat   Hydromorphone                  394          EXPECTED   ng/mg creat   Dihydrocodeine

## 2023-06-09 NOTE — Patient Instructions (Signed)
 ____________________________________________________________________________________________  Opioid Pain Medication Update  To: All patients taking opioid pain medications. (I.e.: hydrocodone, hydromorphone, oxycodone, oxymorphone, morphine, codeine, methadone, tapentadol, tramadol, buprenorphine, fentanyl, etc.)  Re: Updated review of side effects and adverse reactions of opioid analgesics, as well as new information about long term effects of this class of medications.  Direct risks of long-term opioid therapy are not limited to opioid addiction and overdose. Potential medical risks include serious fractures, breathing problems during sleep, hyperalgesia, immunosuppression, chronic constipation, bowel obstruction, myocardial infarction, and tooth decay secondary to xerostomia.  Unpredictable adverse effects that can occur even if you take your medication correctly: Cognitive impairment, respiratory depression, and death. Most people think that if they take their medication "correctly", and "as instructed", that they will be safe. Nothing could be farther from the truth. In reality, a significant amount of recorded deaths associated with the use of opioids has occurred in individuals that had taken the medication for a long time, and were taking their medication correctly. The following are examples of how this can happen: Patient taking his/her medication for a long time, as instructed, without any side effects, is given a certain antibiotic or another unrelated medication, which in turn triggers a "Drug-to-drug interaction" leading to disorientation, cognitive impairment, impaired reflexes, respiratory depression or an untoward event leading to serious bodily harm or injury, including death.  Patient taking his/her medication for a long time, as instructed, without any side effects, develops an acute impairment of liver and/or kidney function. This will lead to a rapid inability of the body to  breakdown and eliminate their pain medication, which will result in effects similar to an "overdose", but with the same medicine and dose that they had always taken. This again may lead to disorientation, cognitive impairment, impaired reflexes, respiratory depression or an untoward event leading to serious bodily harm or injury, including death.  A similar problem will occur with patients as they grow older and their liver and kidney function begins to decrease as part of the aging process.  Background information: Historically, the original case for using long-term opioid therapy to treat chronic noncancer pain was based on safety assumptions that subsequent experience has called into question. In 1996, the American Pain Society and the American Academy of Pain Medicine issued a consensus statement supporting long-term opioid therapy. This statement acknowledged the dangers of opioid prescribing but concluded that the risk for addiction was low; respiratory depression induced by opioids was short-lived, occurred mainly in opioid-naive patients, and was antagonized by pain; tolerance was not a common problem; and efforts to control diversion should not constrain opioid prescribing. This has now proven to be wrong. Experience regarding the risks for opioid addiction, misuse, and overdose in community practice has failed to support these assumptions.  According to the Centers for Disease Control and Prevention, fatal overdoses involving opioid analgesics have increased sharply over the past decade. Currently, more than 96,700 people die from drug overdoses every year. Opioids are a factor in 7 out of every 10 overdose deaths. Deaths from drug overdose have surpassed motor vehicle accidents as the leading cause of death for individuals between the ages of 66 and 14.  Clinical data suggest that neuroendocrine dysfunction may be very common in both men and women, potentially causing hypogonadism, erectile  dysfunction, infertility, decreased libido, osteoporosis, and depression. Recent studies linked higher opioid dose to increased opioid-related mortality. Controlled observational studies reported that long-term opioid therapy may be associated with increased risk for cardiovascular events. Subsequent meta-analysis concluded  that the safety of long-term opioid therapy in elderly patients has not been proven.   Side Effects and adverse reactions: Common side effects: Drowsiness (sedation). Dizziness. Nausea and vomiting. Constipation. Physical dependence -- Dependence often manifests with withdrawal symptoms when opioids are discontinued or decreased. Tolerance -- As you take repeated doses of opioids, you require increased medication to experience the same effect of pain relief. Respiratory depression -- This can occur in healthy people, especially with higher doses. However, people with COPD, asthma or other lung conditions may be even more susceptible to fatal respiratory impairment.  Uncommon side effects: An increased sensitivity to feeling pain and extreme response to pain (hyperalgesia). Chronic use of opioids can lead to this. Delayed gastric emptying (the process by which the contents of your stomach are moved into your small intestine). Muscle rigidity. Immune system and hormonal dysfunction. Quick, involuntary muscle jerks (myoclonus). Arrhythmia. Itchy skin (pruritus). Dry mouth (xerostomia).  Long-term side effects: Chronic constipation. Sleep-disordered breathing (SDB). Increased risk of bone fractures. Hypothalamic-pituitary-adrenal dysregulation. Increased risk of overdose.  RISKS: Respiratory depression and death: Opioids increase the risk of respiratory depression and death.  Drug-to-drug interactions: Opioids are relatively contraindicated in combination with benzodiazepines, sleep inducers, and other central nervous system depressants. Other classes of medications  (i.e.: certain antibiotics and even over-the-counter medications) may also trigger or induce respiratory depression in some patients.  Medical conditions: Patients with pre-existing respiratory problems are at higher risk of respiratory failure and/or depression when in combination with opioid analgesics. Opioids are relatively contraindicated in some medical conditions such as central sleep apnea.   Fractures and Falls:  Opioids increase the risk and incidence of falls. This is of particular importance in elderly patients.  Endocrine System:  Long-term administration is associated with endocrine abnormalities (endocrinopathies). (Also known as Opioid-induced Endocrinopathy) Influences on both the hypothalamic-pituitary-adrenal axis?and the hypothalamic-pituitary-gonadal axis have been demonstrated with consequent hypogonadism and adrenal insufficiency in both sexes. Hypogonadism and decreased levels of dehydroepiandrosterone sulfate have been reported in men and women. Endocrine effects include: Amenorrhoea in women (abnormal absence of menstruation) Reduced libido in both sexes Decreased sexual function Erectile dysfunction in men Hypogonadisms (decreased testicular function with shrinkage of testicles) Infertility Depression and fatigue Loss of muscle mass Anxiety Depression Immune suppression Hyperalgesia Weight gain Anemia Osteoporosis Patients (particularly women of childbearing age) should avoid opioids. There is insufficient evidence to recommend routine monitoring of asymptomatic patients taking opioids in the long-term for hormonal deficiencies.  Immune System: Human studies have demonstrated that opioids have an immunomodulating effect. These effects are mediated via opioid receptors both on immune effector cells and in the central nervous system. Opioids have been demonstrated to have adverse effects on antimicrobial response and anti-tumour surveillance. Buprenorphine has  been demonstrated to have no impact on immune function.  Opioid Induced Hyperalgesia: Human studies have demonstrated that prolonged use of opioids can lead to a state of abnormal pain sensitivity, sometimes called opioid induced hyperalgesia (OIH). Opioid induced hyperalgesia is not usually seen in the absence of tolerance to opioid analgesia. Clinically, hyperalgesia may be diagnosed if the patient on long-term opioid therapy presents with increased pain. This might be qualitatively and anatomically distinct from pain related to disease progression or to breakthrough pain resulting from development of opioid tolerance. Pain associated with hyperalgesia tends to be more diffuse than the pre-existing pain and less defined in quality. Management of opioid induced hyperalgesia requires opioid dose reduction.  Cancer: Chronic opioid therapy has been associated with an increased risk of cancer  among noncancer patients with chronic pain. This association was more evident in chronic strong opioid users. Chronic opioid consumption causes significant pathological changes in the small intestine and colon. Epidemiological studies have found that there is a link between opium dependence and initiation of gastrointestinal cancers. Cancer is the second leading cause of death after cardiovascular disease. Chronic use of opioids can cause multiple conditions such as GERD, immunosuppression and renal damage as well as carcinogenic effects, which are associated with the incidence of cancers.   Mortality: Long-term opioid use has been associated with increased mortality among patients with chronic non-cancer pain (CNCP).  Prescription of long-acting opioids for chronic noncancer pain was associated with a significantly increased risk of all-cause mortality, including deaths from causes other than overdose.  Reference: Von Korff M, Kolodny A, Deyo RA, Chou R. Long-term opioid therapy reconsidered. Ann Intern Med. 2011  Sep 6;155(5):325-8. doi: 10.7326/[id number]. PMID: 16109604; PMCID: [id number]. Randon Goldsmith, Hayward RA, Dunn KM, [place] KP. Risk of adverse events in patients prescribed long-term opioids: A cohort study in the [place] [facility] Research Datalink. Eur J Pain. 2019 May;23(5):908-922. doi: 10.1002/ejp.1357. Epub 2019 Jan 31. PMID: 78295621. Colameco S, Coren JS, Ci[place]o CA. Continuous opioid treatment for chronic noncancer pain: a time for moderation in prescribing. Postgrad Med. 2009 Jul;121(4):61-6. doi: 10.3810/pgm.2009.07.2032. PMID: 30865784. William Hamburger RN, [place], Blazina I, Cristopher Peru, Bougatsos C, Deyo RA. The effectiveness and risks of long-term opioid therapy for chronic pain: a systematic review for a [employer] of [employer] Pathways to [employer]. Ann Intern Med. 2015 Feb 17;162(4):276-86. doi: 10.7326/M14-2559. PMID: 69629528. Caryl Bis [facility], [place] DM. NCHS Data Brief No. 22. [place]: Centers for Disease Control and Prevention; 2009. Sep, Increase in Fatal Poisonings Involving Opioid Analgesics in the [place], 1999-2006. Song IA, Choi HR, Oh TK. Long-term opioid use and mortality in patients with chronic non-cancer pain: Ten-year follow-up study in [place] from 2010 through 2019. EClinicalMedicine. 2022 Jul 18;51:101558. doi: 10.1016/j.eclinm.2022.413244. PMID: 01027253; PMCID: [id number]. Huser, W., Schubert, T., Vogelmann, T. et al. All-cause mortality in patients with long-term opioid therapy compared with non-opioid analgesics for chronic non-cancer pain: a database study. BMC Med 18, 162 (2020). http://lester.info/ Rashidian H, Karie Kirks, Malekzadeh R, Haghdoost AA. An Ecological Study of the Association between Opiate Use and Incidence of Cancers. Addict Health. 2016 Fall;8(4):252-260. PMID: 59563875; PMCID: [id number].  Our Goal: Our goal is to control your  pain with means other than the use of opioid pain medications.  Our Recommendation: Talk to your physician about coming off of these medications. We can assist you with the tapering down and stopping these medicines. Based on the new information, even if you cannot completely stop the medication, a decrease in the dose may be associated with a lesser risk. Ask for other means of controlling the pain. Decrease or eliminate those factors that significantly contribute to your pain such as smoking, obesity, and a diet heavily tilted towards "inflammatory" nutrients.  Last Updated: 03/11/2023   ____________________________________________________________________________________________     ____________________________________________________________________________________________  National Pain Medication Shortage  The [place] is experiencing worsening drug shortages. These have had a negative widespread effect on patient care and treatment. Not expected to improve any time soon. Predicted to last past 2029.   Drug shortage list (generic names) Oxycodone IR Oxycodone/APAP Oxymorphone IR Hydromorphone Hydrocodone/APAP Morphine  Where is the problem?  Manufacturing and supply level.  Will this shortage affect you?  Only if you  take any of the above pain medications.  How? You may be unable to fill your prescription.  Your pharmacist may offer a "partial fill" of your prescription. (Warning: Do not accept partial fills.) Prescriptions partially filled cannot be transferred to another pharmacy. Read our Medication Rules and Regulation. Depending on how much medicine you are dependent on, you may experience withdrawals when unable to get the medication.  Recommendations: Consider ending your dependence on opioid pain medications. Ask your pain specialist to assist you with the process. Consider switching to a medication currently not in shortage, such as Buprenorphine. Talk to your pain  specialist about this option. Consider decreasing your pain medication requirements by managing tolerance thru "Drug Holidays". This may help minimize withdrawals, should you run out of medicine. Control your pain thru the use of non-pharmacological interventional therapies.   Your prescriber: Prescribers cannot be blamed for shortages. Medication manufacturing and supply issues cannot be fixed by the prescriber.   NOTE: The prescriber is not responsible for supplying the medication, or solving supply issues. Work with your pharmacist to solve it. The patient is responsible for the decision to take or continue taking the medication and for identifying and securing a legal supply source. By law, supplying the medication is the job and responsibility of the pharmacy. The prescriber is responsible for the evaluation, monitoring, and prescribing of these medications.   Prescribers will NOT: Re-issue prescriptions that have been partially filled. Re-issue prescriptions already sent to a pharmacy.  Re-send prescriptions to a different pharmacy because yours did not have your medication. Ask pharmacist to order more medicine or transfer the prescription to another pharmacy. (Read below.)  New 2023 regulation: "May 04, 2022 Revised Regulation Allows DEA-Registered Pharmacies to Transfer Electronic Prescriptions at a Patient's Request DEA Headquarters Division - Public Information Office Patients now have the ability to request their electronic prescription be transferred to another pharmacy without having to go back to their practitioner to initiate the request. This revised regulation went into effect on Monday, April 30, 2022.     At a patient's request, a DEA-registered retail pharmacy can now transfer an electronic prescription for a controlled substance (schedules II-V) to another DEA-registered retail pharmacy. Prior to this change, patients would have to go through their practitioner to  cancel their prescription and have it re-issued to a different pharmacy. The process was taxing and time consuming for both patients and practitioners.    The Drug Enforcement Administration [facility]) published its intent to revise the process for transferring electronic prescriptions on July 22, 2020.  The final rule was published in the federal register on March 29, 2022 and went into effect 30 days later.  Under the final rule, a prescription can only be transferred once between pharmacies, and only if allowed under existing state or other applicable law. The prescription must remain in its electronic form; may not be altered in any way; and the transfer must be communicated directly between two licensed pharmacists. It's important to note, any authorized refills transfer with the original prescription, which means the entire prescription will be filled at the same pharmacy".  Reference: [email or web address] [facility] website announcement)  [email or web address] [employer] of [employer])   [employer] / Vol. 88, No. 143 / Thursday, March 29, 2022 / Rules and Regulations DEPARTMENT OF JUSTICE  Drug Enforcement Administration  21 CFR Part 1306  [Docket No. [id number]]  RIN [id number] Transfer of Electronic Prescriptions for Schedules II-V Controlled Substances Between Pharmacies for Initial Filling  ____________________________________________________________________________________________  ____________________________________________________________________________________________  Transfer of Pain Medication between Pharmacies  Re: 2023 DEA Clarification on existing regulation  Published on DEA Website: May 04, 2022  Title: Revised Regulation Allows DEA-Registered Pharmacies to Electrical engineer Prescriptions at a Patient's  Request DEA Headquarters Division - [employer]  "Patients now have the ability to request their electronic prescription be transferred to another pharmacy without having to go back to their practitioner to initiate the request. This revised regulation went into effect on Monday, April 30, 2022.     At a patient's request, a DEA-registered retail pharmacy can now transfer an electronic prescription for a controlled substance (schedules II-V) to another DEA-registered retail pharmacy. Prior to this change, patients would have to go through their practitioner to cancel their prescription and have it re-issued to a different pharmacy. The process was taxing and time consuming for both patients and practitioners.    The Drug Enforcement Administration [facility]) published its intent to revise the process for transferring electronic prescriptions on July 22, 2020.  The final rule was published in the federal register on March 29, 2022 and went into effect 30 days later.  Under the final rule, a prescription can only be transferred once between pharmacies, and only if allowed under existing state or other applicable law. The prescription must remain in its electronic form; may not be altered in any way; and the transfer must be communicated directly between two licensed pharmacists. It's important to note, any authorized refills transfer with the original prescription, which means the entire prescription will be filled at the same pharmacy."    REFERENCES: 1. DEA website announcement [email or web address]  2. Department of Justice website  [email or web address]  3. DEPARTMENT OF JUSTICE Drug Enforcement Administration 21 CFR Part 1306 [Docket No. [id number]] RIN [id number] "Transfer of Electronic Prescriptions for Schedules II-V Controlled Substances  Between Pharmacies for Initial Filling"  ____________________________________________________________________________________________     _______________________________________________________________________  Medication Rules  Purpose: To inform patients, and their family members, of our medication rules and regulations.  Applies to: All patients receiving prescriptions from our practice (written or electronic).  Pharmacy of record: This is the pharmacy where your electronic prescriptions will be sent. Make sure we have the correct one.  Electronic prescriptions: In compliance with the [facility] Strengthen Opioid Misuse Prevention (STOP) Act of 2017 (Session Conni Elliot [phone]), effective September 03, 2018, all controlled substances must be electronically prescribed. Written prescriptions, faxing, or calling prescriptions to a pharmacy will no longer be done.  Prescription refills: These will be provided only during in-person appointments. No medications will be renewed without a "face-to-face" evaluation with your provider. Applies to all prescriptions.  NOTE: The following applies primarily to controlled substances (Opioid* Pain Medications).   Type of encounter (visit): For patients receiving controlled substances, face-to-face visits are required. (Not an option and not up to the patient.)  Patient's responsibilities: Pain Pills: Bring all pain pills to every appointment (except for procedure appointments). Pill Bottles: Bring pills in original pharmacy bottle. Bring bottle, even if empty. Always bring the bottle of the most recent fill.  Medication refills: You are responsible for knowing and keeping track of what medications you are taking and when is it that you will need a refill. The day before your appointment: write a list of all prescriptions that need to be refilled. The day of the appointment: give the list to the admitting nurse. Prescriptions will be written only  during appointments. No prescriptions will be written on procedure days. If you forget a  medication: it will not be "Called in", "Faxed", or "electronically sent". You will need to get another appointment to get these prescribed. No early refills. Do not call asking to have your prescription filled early. Partial  or short prescriptions: Occasionally your pharmacy may not have enough pills to fill your prescription.  NEVER ACCEPT a partial fill or a prescription that is short of the total amount of pills that you were prescribed.  With controlled substances the law allows 72 hours for the pharmacy to complete the prescription.  If the prescription is not completed within 72 hours, the pharmacist will require a new prescription to be written. This means that you will be short on your medicine and we WILL NOT send another prescription to complete your original prescription.  Instead, request the pharmacy to send a carrier to a nearby branch to get enough medication to provide you with your full prescription. Prescription Accuracy: You are responsible for carefully inspecting your prescriptions before leaving our office. Have the discharge nurse carefully go over each prescription with you, before taking them home. Make sure that your name is accurately spelled, that your address is correct. Check the name and dose of your medication to make sure it is accurate. Check the number of pills, and the written instructions to make sure they are clear and accurate. Make sure that you are given enough medication to last until your next medication refill appointment. Taking Medication: Take medication as prescribed. When it comes to controlled substances, taking less pills or less frequently than prescribed is permitted and encouraged. Never take more pills than instructed. Never take the medication more frequently than prescribed.  Inform other Doctors: Always inform, all of your healthcare providers, of all the  medications you take. Pain Medication from other Providers: You are not allowed to accept any additional pain medication from any other Doctor or Healthcare provider. There are two exceptions to this rule. (see below) In the event that you require additional pain medication, you are responsible for notifying us, as stated below. Cough Medicine: Often these contain an opioid, such as codeine or hydrocodone. Never accept or take cough medicine containing these opioids if you are already taking an opioid* medication. The combination may cause respiratory failure and death. Medication Agreement: You are responsible for carefully reading and following our Medication Agreement. This must be signed before receiving any prescriptions from our practice. Safely store a copy of your signed Agreement. Violations to the Agreement will result in no further prescriptions. (Additional copies of our Medication Agreement are available upon request.) Laws, Rules, & Regulations: All patients are expected to follow all [address] and [employer], [employer], Rules, [place] [place]. Ignorance of the Laws does not constitute a valid excuse.  Illegal drugs and Controlled Substances: The use of illegal substances (including, but not limited to marijuana and its derivatives) and/or the illegal use of any controlled substances is strictly prohibited. Violation of this rule may result in the immediate and permanent discontinuation of any and all prescriptions being written by our practice. The use of any illegal substances is prohibited. Adopted CDC guidelines & recommendations: Target dosing levels will be at or below 60 MME/day. Use of benzodiazepines** is not recommended.  Exceptions: There are only two exceptions to the rule of not receiving pain medications from other Healthcare Providers. Exception #1 (Emergencies): In the event of an emergency (i.e.: accident requiring emergency care), you are allowed to receive additional pain  medication. However, you are responsible for: As soon as  you are able, call our office [phone], at any time of the day or night, and leave a message stating your name, the date and nature of the emergency, and the name and dose of the medication prescribed. In the event that your call is answered by a member of our staff, make sure to document and save the date, time, and the name of the person that took your information.  Exception #2 (Planned Surgery): In the event that you are scheduled by another doctor or dentist to have any type of surgery or procedure, you are allowed (for a period no longer than 30 days), to receive additional pain medication, for the acute post-op pain. However, in this case, you are responsible for picking up a copy of our "Post-op Pain Management for Surgeons" handout, and giving it to your surgeon or dentist. This document is available at our office, and does not require an appointment to obtain it. Simply go to our office during business hours (Monday-Thursday from 8:00 AM to 4:00 PM) (Friday 8:00 AM to 12:00 Noon) or if you have a scheduled appointment with [place], prior to your surgery, and ask for it by name. In addition, you are responsible for: calling our office [phone], at any time of the day or night, and leaving a message stating your name, name of your surgeon, type of surgery, and date of procedure or surgery. Failure to comply with your responsibilities may result in termination of therapy involving the controlled substances. Medication Agreement Violation. Following the above rules, including your responsibilities will help you in avoiding a Medication Agreement Violation ("Breaking your Pain Medication Contract").  Consequences:  Not following the above rules may result in permanent discontinuation of medication prescription therapy.  *Opioid medications include: morphine, codeine, oxycodone, oxymorphone, hydrocodone, hydromorphone, meperidine, tramadol,  tapentadol, buprenorphine, fentanyl, methadone. **Benzodiazepine medications include: diazepam (Valium), alprazolam (Xanax), clonazepam (Klonopine), lorazepam (Ativan), clorazepate (Tranxene), chlordiazepoxide (Librium), estazolam (Prosom), oxazepam (Serax), temazepam (Restoril), triazolam (Halcion) (Last updated: 06/26/2022) ______________________________________________________________________    ______________________________________________________________________  Medication Recommendations and Reminders  Applies to: All patients receiving prescriptions (written and/or electronic).  Medication Rules & Regulations: You are responsible for reading, knowing, and following our "Medication Rules" document. These exist for your safety and that of others. They are not flexible and neither are we. Dismissing or ignoring them is an act of "non-compliance" that may result in complete and irreversible termination of such medication therapy. For safety reasons, "non-compliance" will not be tolerated. As with the [place] fundamental legal principle of "ignorance of the law is no defense", we will accept no excuses for not having read and knowing the content of documents provided to you by our practice.  Pharmacy of record:  Definition: This is the pharmacy where your electronic prescriptions will be sent.  We do not endorse any particular pharmacy. It is up to you and your insurance to decide what pharmacy to use.  We do not restrict you in your choice of pharmacy. However, once we write for your prescriptions, we will NOT be re-sending more prescriptions to fix restricted supply problems created by your pharmacy, or your insurance.  The pharmacy listed in the electronic medical record should be the one where you want electronic prescriptions to be sent. If you choose to change pharmacy, simply notify our nursing staff. Changes will be made only during your regular appointments and not over the  phone.  Recommendations: Keep all of your pain medications in a safe place, under lock and key, even  if you live alone. We will NOT replace lost, stolen, or damaged medication. We do not accept "Police Reports" as proof of medications having been stolen. After you fill your prescription, take 1 week's worth of pills and put them away in a safe place. You should keep a separate, properly labeled bottle for this purpose. The remainder should be kept in the original bottle. Use this as your primary supply, until it runs out. Once it's gone, then you know that you have 1 week's worth of medicine, and it is time to come in for a prescription refill. If you do this correctly, it is unlikely that you will ever run out of medicine. To make sure that the above recommendation works, it is very important that you make sure your medication refill appointments are scheduled at least 1 week before you run out of medicine. To do this in an effective manner, make sure that you do not leave the office without scheduling your next medication management appointment. Always ask the nursing staff to show you in your prescription , when your medication will be running out. Then arrange for the receptionist to get you a return appointment, at least 7 days before you run out of medicine. Do not wait until you have 1 or 2 pills left, to come in. This is very poor planning and does not take into consideration that we may need to cancel appointments due to bad weather, sickness, or emergencies affecting our staff. DO NOT ACCEPT A "Partial Fill": If for any reason your pharmacy does not have enough pills/tablets to completely fill or refill your prescription, do not allow for a "partial fill". The law allows the pharmacy to complete that prescription within 72 hours, without requiring a new prescription. If they do not fill the rest of your prescription within those 72 hours, you will need a separate prescription to fill the remaining  amount, which we will NOT provide. If the reason for the partial fill is your insurance, you will need to talk to the pharmacist about payment alternatives for the remaining tablets, but again, DO NOT ACCEPT A PARTIAL FILL, unless you can trust your pharmacist to obtain the remainder of the pills within 72 hours.  Prescription refills and/or changes in medication(s):  Prescription refills, and/or changes in dose or medication, will be conducted only during scheduled medication management appointments. (Applies to both, written and electronic prescriptions.) No refills on procedure days. No medication will be changed or started on procedure days. No changes, adjustments, and/or refills will be conducted on a procedure day. Doing so will interfere with the diagnostic portion of the procedure. No phone refills. No medications will be "called into the pharmacy". No Fax refills. No weekend refills. No Holliday refills. No after hours refills.  Remember:  Business hours are:  Monday to Thursday 8:00 AM to 4:00 PM Provider's Schedule: Delano Metz, MD - Appointments are:  Medication management: Monday and Wednesday 8:00 AM to 4:00 PM Procedure day: Tuesday and Thursday 7:30 AM to 4:00 PM Edward Jolly, MD - Appointments are:  Medication management: Tuesday and Thursday 8:00 AM to 4:00 PM Procedure day: Monday and Wednesday 7:30 AM to 4:00 PM (Last update: 06/26/2022) ______________________________________________________________________    ____________________________________________________________________________________________  Drug Holidays  What is a "Drug Holiday"? Drug Holiday: is the name given to the process of slowly tapering down and temporarily stopping the pain medication for the purpose of decreasing or eliminating tolerance to the drug.  Benefits Improved effectiveness Decreased required effective  dose Improved pain control End dependence on high dose  therapy Decrease cost of therapy Uncovering "opioid-induced hyperalgesia". (OIH)  What is "opioid hyperalgesia"? It is a paradoxical increase in pain caused by exposure to opioids. Stopping the opioid pain medication, contrary to the expected, it actually decreases or completely eliminates the pain. Ref.: "A comprehensive review of opioid-induced hyperalgesia". Donney Rankins, et.al. Pain Physician. 2011 Mar-Apr;14(2):145-61.  What is tolerance? Tolerance: the progressive loss of effectiveness of a pain medicine due to repetitive use. A common problem of opioid pain medications.  How long should a "Drug Holiday" last? Effectiveness depends on the patient staying off all opioid pain medicines for a minimum of 14 consecutive days. (2 weeks)  How about just taking less of the medicine? Does not work. Will not accomplish goal of eliminating the excess receptors.  How about switching to a different pain medicine? (AKA. "Opioid rotation") Does not work. Creates the illusion of effectiveness by taking advantage of inaccurate equivalent dose calculations between different opioids. -This "technique" was promoted by studies funded by [employer], such as [employer], creators of "OxyContin".  Can I stop the medicine "cold [place]"? We do not recommend it. You should always coordinate with your prescribing physician to make the transition as smoothly as possible. Avoid stopping the medicine abruptly without consulting. We recommend a "slow taper".  What is a slow taper? Taper: refers to the gradual decrease in dose.   How do I stop/taper the dose? Slowly. Decrease the daily amount of pills that you take by one (1) pill every seven (7) days. This is called a "slow downward taper". Example: if you normally take four (4) pills per day, drop it to three (3) pills per day for seven (7) days, then to two (2) pills per day for seven (7) days, then to one (1) per day for seven (7) days, and then  stop the medicine. The 14 day "Drug Holiday" starts on the first day without medicine.   Will I experience withdrawals? Unlikely with a slow taper.  What triggers withdrawals? Withdrawals are triggered by the sudden/abrupt stop of high dose opioids. Withdrawals can be avoided by slowly decreasing the dose over a prolonged period of time.  What are withdrawals? Symptoms associated with sudden/abrupt reduction/stopping of high-dose, long-term use of pain medication. Withdrawal are seldom seen on low dose therapy, or patients rarely taking opioid medication.  Early Withdrawal Symptoms may include: Agitation Anxiety Muscle aches Increased tearing Insomnia Runny nose Sweating Yawning  Late symptoms may include: Abdominal cramping Diarrhea Dilated pupils Goose bumps Nausea Vomiting  When could I see withdrawals? Onset: 8-24 hours after last use for most opioids. 12-48 hours for long-acting opioids (i.e.: methadone)  How long could they last? Duration: 4-10 days for most opioids. 14-21 days for long-acting opioids (i.e.: methadone)  What will happen after I complete my "Drug Holiday"? The need and indications for the opioid analgesic will be reviewed before restarting the medication. Dose requirements will likely decrease and the dose will need to be adjusted accordingly.   (Last update: 11/21/2022) ____________________________________________________________________________________________   ____________________________________________________________________________________________  Naloxone Nasal Spray  Why am I receiving this medication? [place] STOP ACT requires that all patients taking high dose opioids or at risk of opioids respiratory depression, be prescribed an opioid reversal agent, such as Naloxone (AKA: Narcan).  What is this medication? NALOXONE (nal OX one) treats opioid overdose, which causes slow or shallow breathing, severe drowsiness, or trouble  staying awake. Call emergency services after using  this medication. You may need additional treatment. Naloxone works by reversing the effects of opioids. It belongs to a group of medications called opioid blockers.  COMMON BRAND NAME(S): Kloxxado, Narcan  What should I tell my care team before I take this medication? They need to know if you have any of these conditions: Heart disease Substance use disorder An unusual or allergic reaction to naloxone, other medications, foods, dyes, or preservatives Pregnant or trying to get pregnant Breast-feeding  When to use this medication? This medication is to be used for the treatment of respiratory depression (less than 8 breaths per minute) secondary to opioid overdose.   How to use this medication? This medication is for use in the nose. Lay the person on their back. Support their neck with your hand and allow the head to tilt back before giving the medication. The nasal spray should be given into 1 nostril. After giving the medication, move the person onto their side. Do not remove or test the nasal spray until ready to use. Get emergency medical help right away after giving the first dose of this medication, even if the person wakes up. You should be familiar with how to recognize the signs and symptoms of a narcotic overdose. If more doses are needed, give the additional dose in the other nostril. Talk to your care team about the use of this medication in children. While this medication may be prescribed for children as young as newborns for selected conditions, precautions do apply.  Naloxone Overdosage: If you think you have taken too much of this medicine contact a poison control center or emergency room at once.  NOTE: This medicine is only for you. Do not share this medicine with others.  What if I miss a dose? This does not apply.  What may interact with this medication? This is only used during an emergency. No interactions are  expected during emergency use. This list may not describe all possible interactions. Give your health care provider a list of all the medicines, herbs, non-prescription drugs, or dietary supplements you use. Also tell them if you smoke, drink alcohol, or use illegal drugs. Some items may interact with your medicine.  What should I watch for while using this medication? Keep this medication ready for use in the case of an opioid overdose. Make sure that you have the phone number of your care team and local hospital ready. You may need to have additional doses of this medication. Each nasal spray contains a single dose. Some emergencies may require additional doses. After use, bring the treated person to the nearest hospital or call 911. Make sure the treating care team knows that the person has received a dose of this medication. You will receive additional instructions on what to do during and after use of this medication before an emergency occurs.  What side effects may I notice from receiving this medication? Side effects that you should report to your care team as soon as possible: Allergic reactions--skin rash, itching, hives, swelling of the face, lips, tongue, or throat Side effects that usually do not require medical attention (report these to your care team if they continue or are bothersome): Constipation Dryness or irritation inside the nose Headache Increase in blood pressure Muscle spasms Stuffy nose Toothache This list may not describe all possible side effects. Call your doctor for medical advice about side effects. You may report side effects to FDA at 1-800-FDA-1088.  Where should I keep my medication? Because this is  an emergency medication, you should keep it with you at all times.  Keep out of the reach of children and pets. Store between 20 and 25 degrees C (68 and 77 degrees F). Do not freeze. Throw away any unused medication after the expiration date. Keep in original  box until ready to use.  NOTE: This sheet is a summary. It may not cover all possible information. If you have questions about this medicine, talk to your doctor, pharmacist, or health care provider.   2023 Elsevier/Gold Standard (2021-04-28 00:00:00)  ____________________________________________________________________________________________

## 2023-06-10 ENCOUNTER — Encounter: Payer: Self-pay | Admitting: Pain Medicine

## 2023-06-10 ENCOUNTER — Ambulatory Visit: Payer: Medicare Other | Attending: Pain Medicine | Admitting: Pain Medicine

## 2023-06-10 VITALS — BP 143/91 | HR 77 | Temp 97.4°F | Ht 63.0 in | Wt 165.0 lb

## 2023-06-10 DIAGNOSIS — M79605 Pain in left leg: Secondary | ICD-10-CM | POA: Diagnosis not present

## 2023-06-10 DIAGNOSIS — G8929 Other chronic pain: Secondary | ICD-10-CM | POA: Diagnosis present

## 2023-06-10 DIAGNOSIS — Z79891 Long term (current) use of opiate analgesic: Secondary | ICD-10-CM | POA: Insufficient documentation

## 2023-06-10 DIAGNOSIS — M25511 Pain in right shoulder: Secondary | ICD-10-CM | POA: Insufficient documentation

## 2023-06-10 DIAGNOSIS — M47816 Spondylosis without myelopathy or radiculopathy, lumbar region: Secondary | ICD-10-CM | POA: Diagnosis present

## 2023-06-10 DIAGNOSIS — M545 Low back pain, unspecified: Secondary | ICD-10-CM | POA: Diagnosis present

## 2023-06-10 DIAGNOSIS — R0789 Other chest pain: Secondary | ICD-10-CM | POA: Insufficient documentation

## 2023-06-10 DIAGNOSIS — G894 Chronic pain syndrome: Secondary | ICD-10-CM | POA: Insufficient documentation

## 2023-06-10 DIAGNOSIS — Z79899 Other long term (current) drug therapy: Secondary | ICD-10-CM | POA: Insufficient documentation

## 2023-06-10 DIAGNOSIS — M79604 Pain in right leg: Secondary | ICD-10-CM | POA: Insufficient documentation

## 2023-06-10 MED ORDER — HYDROCODONE-ACETAMINOPHEN 5-325 MG PO TABS: 1.0000 | ORAL_TABLET | Freq: Two times a day (BID) | ORAL | 0 refills | Status: DC | PRN

## 2023-06-10 NOTE — Progress Notes (Signed)
Safety precautions to be maintained throughout the outpatient stay will include: orient to surroundings, keep bed in low position, maintain call bell within reach at all times, provide assistance with transfer out of bed and ambulation.   Nursing Pain Medication Assessment:  Safety precautions to be maintained throughout the outpatient stay will include: orient to surroundings, keep bed in low position, maintain call bell within reach at all times, provide assistance with transfer out of bed and ambulation.  Medication Inspection Compliance: Pill count conducted under aseptic conditions, in front of the patient. Neither the pills nor the bottle was removed from the patient's sight at any time. Once count was completed pills were immediately returned to the patient in their original bottle.  Medication: Hydrocodone/APAP Pill/Patch Count:  21 of 60 pills remain Pill/Patch Appearance: Markings consistent with prescribed medication Bottle Appearance: Standard pharmacy container. Clearly labeled. Filled Date: 31 / 11 / 2024 Last Medication intake:  Today

## 2023-07-26 DIAGNOSIS — Z01818 Encounter for other preprocedural examination: Secondary | ICD-10-CM | POA: Insufficient documentation

## 2023-07-26 DIAGNOSIS — I6521 Occlusion and stenosis of right carotid artery: Secondary | ICD-10-CM | POA: Insufficient documentation

## 2023-08-08 ENCOUNTER — Telehealth: Payer: Self-pay | Admitting: Pain Medicine

## 2023-08-08 NOTE — Telephone Encounter (Signed)
PT called stated that he had surgery at [facility] and was giving 10 pills of Oxycodone for pain. PT wanted to report the medications that he had got. FYI

## 2023-09-10 ENCOUNTER — Other Ambulatory Visit: Payer: Self-pay

## 2023-09-10 ENCOUNTER — Emergency Department
Admission: EM | Admit: 2023-09-10 | Discharge: 2023-09-10 | Disposition: A | Discharge status: Home / Self Care | Payer: Medicare Other | Attending: Emergency Medicine | Admitting: Emergency Medicine

## 2023-09-10 ENCOUNTER — Telehealth: Payer: Self-pay | Admitting: Pain Medicine

## 2023-09-10 DIAGNOSIS — I2510 Atherosclerotic heart disease of native coronary artery without angina pectoris: Secondary | ICD-10-CM | POA: Insufficient documentation

## 2023-09-10 DIAGNOSIS — Z951 Presence of aortocoronary bypass graft: Secondary | ICD-10-CM | POA: Insufficient documentation

## 2023-09-10 DIAGNOSIS — E1122 Type 2 diabetes mellitus with diabetic chronic kidney disease: Secondary | ICD-10-CM | POA: Insufficient documentation

## 2023-09-10 DIAGNOSIS — R4182 Altered mental status, unspecified: Secondary | ICD-10-CM | POA: Insufficient documentation

## 2023-09-10 DIAGNOSIS — N189 Chronic kidney disease, unspecified: Secondary | ICD-10-CM | POA: Diagnosis not present

## 2023-09-10 DIAGNOSIS — R531 Weakness: Secondary | ICD-10-CM | POA: Diagnosis not present

## 2023-09-10 DIAGNOSIS — I129 Hypertensive chronic kidney disease with stage 1 through stage 4 chronic kidney disease, or unspecified chronic kidney disease: Secondary | ICD-10-CM | POA: Insufficient documentation

## 2023-09-10 LAB — CBC WITH DIFFERENTIAL/PLATELET
Abs Immature Granulocytes: 0.04 10*3/uL (ref 0.00–0.07)
Basophils Absolute: 0 10*3/uL (ref 0.0–0.1)
Basophils Relative: 0 %
Eosinophils Absolute: 0 10*3/uL (ref 0.0–0.5)
Eosinophils Relative: 0 %
HCT: 39.9 % (ref 39.0–52.0)
Hemoglobin: 13 g/dL (ref 13.0–17.0)
Immature Granulocytes: 0 %
Lymphocytes Relative: 10 %
Lymphs Abs: 0.9 10*3/uL (ref 0.7–4.0)
MCH: 28.7 pg (ref 26.0–34.0)
MCHC: 32.6 g/dL (ref 30.0–36.0)
MCV: 88.1 fL (ref 80.0–100.0)
Monocytes Absolute: 0.6 10*3/uL (ref 0.1–1.0)
Monocytes Relative: 6 %
Neutro Abs: 8.1 10*3/uL — ABNORMAL HIGH (ref 1.7–7.7)
Neutrophils Relative %: 84 %
Platelets: 144 10*3/uL — ABNORMAL LOW (ref 150–400)
RBC: 4.53 MIL/uL (ref 4.22–5.81)
RDW: 14.6 % (ref 11.5–15.5)
WBC: 9.7 10*3/uL (ref 4.0–10.5)
nRBC: 0 % (ref 0.0–0.2)

## 2023-09-10 LAB — COMPREHENSIVE METABOLIC PANEL
ALT: 53 U/L — ABNORMAL HIGH (ref 0–44)
AST: 56 U/L — ABNORMAL HIGH (ref 15–41)
Albumin: 4.6 g/dL (ref 3.5–5.0)
Alkaline Phosphatase: 84 U/L (ref 38–126)
Anion gap: 17 — ABNORMAL HIGH (ref 5–15)
BUN: 19 mg/dL (ref 8–23)
CO2: 20 mmol/L — ABNORMAL LOW (ref 22–32)
Calcium: 8.7 mg/dL — ABNORMAL LOW (ref 8.9–10.3)
Chloride: 101 mmol/L (ref 98–111)
Creatinine, Ser: 1.08 mg/dL (ref 0.61–1.24)
GFR, Estimated: >60 mL/min (ref >60)
Glucose, Bld: 164 mg/dL — ABNORMAL HIGH (ref 70–99)
Potassium: 3.2 mmol/L — ABNORMAL LOW (ref 3.5–5.1)
Sodium: 138 mmol/L (ref 135–145)
Total Bilirubin: 1.1 mg/dL (ref 0.0–1.2)
Total Protein: 8 g/dL (ref 6.5–8.1)

## 2023-09-10 LAB — ETHANOL: Alcohol, Ethyl (B): <10 mg/dL (ref <10)

## 2023-09-10 LAB — SALICYLATE LEVEL: Salicylate Lvl: <7 mg/dL — ABNORMAL LOW (ref 7.0–30.0)

## 2023-09-10 LAB — ACETAMINOPHEN LEVEL: Acetaminophen (Tylenol), Serum: 10 ug/mL (ref 10–30)

## 2023-09-10 MED ORDER — POTASSIUM CHLORIDE CRYS ER 20 MEQ PO TBCR
40.0000 meq | EXTENDED_RELEASE_TABLET | Freq: Once | ORAL | Status: AC
  Administered 2023-09-10: 40 meq via ORAL
  Filled 2023-09-10: qty 2

## 2023-09-10 NOTE — Telephone Encounter (Signed)
 Sophie from [facility] called stated that got a call to the patient home. EMS stated that upon arrive the patient has oxycodone and hydrocodone . PT had took both medication and told EMS that he is suppose to take both medications at the same time. PT seems out of it so they gave him narcan . EMS just wanted to know if the patient is suppose to be taking both medications. If you have any questions you can give Sophie a call at [phone]. TY

## 2023-09-10 NOTE — ED Triage Notes (Signed)
 First Nurse Note: Patient to ED via [facility] from home for generalized weakness. Pt took bottle of oxycodone (unsure of how many). Given 1g of Narcan spray by EMS- pt able to move around like normal per EMS. Hx of HTN- 170/100 with EMS

## 2023-09-10 NOTE — ED Provider Notes (Signed)
 [facility] Provider Note    Event Date/Time   First MD Initiated Contact with Patient 09/10/23 1335     (approximate)   History   Chief Complaint Altered Mental Status (Pt brought in by EMS d/t possible overdose of taking pills out of an oxycodone bottle that was not his, pt was given narcan  and is now awake and ambulatory. Pt seems to be altered but unsure what his baseline is.)   HPI  [name] [name]. is a 68 y.o. male with past medical history of hypertension, diabetes, CAD status post CABG, PAD, CKD, alcohol abuse, and chronic pain syndrome who presents to the ED for generalized weakness.  Patient reports that he woke up this morning feeling very sweaty and shaky with some generalized weakness.  He states he was feeling well when he went to bed last night, has not had any fevers, cough, chest pain, shortness of breath.  He also denies any nausea, vomiting, diarrhea, dysuria, abdominal pain, or flank pain.  EMS reportedly found both oxycodone and hydrocodone  at his home on arrival, was concerned that he may have overdosed and administered Narcan .  Patient states that he did not need the Narcan , reports taking his usual dose of hydrocodone  this morning and had the oxycodone leftover from a recent surgery but had not taken it today.  He states he is feeling much better at the time of my assessment, denies any complaints currently.     Physical Exam   Triage Vital Signs: ED Triage Vitals  Encounter Vitals Group     BP 09/10/23 1106 (!) 160/107     Systolic BP Percentile --      Diastolic BP Percentile --      Pulse Rate 09/10/23 1106 84     Resp 09/10/23 1106 16     Temp --      Temp src --      SpO2 09/10/23 1106 100 %     Weight --      Height --      Head Circumference --      Peak Flow --      Pain Score 09/10/23 1107 0     Pain Loc --      Pain Education --      Exclude from Growth Chart --     Most recent vital signs: Vitals:   09/10/23  1106 09/10/23 1400  BP: (!) 160/107 (!) 168/95  Pulse: 84 78  Resp: 16 14  SpO2: 100% 98%    Constitutional: Alert and oriented. Eyes: Conjunctivae are normal. Head: Atraumatic. Nose: No congestion/rhinnorhea. Mouth/Throat: Mucous membranes are moist.  Cardiovascular: Normal rate, regular rhythm. Grossly normal heart sounds.  2+ radial pulses bilaterally. Respiratory: Normal respiratory effort.  No retractions. Lungs CTAB. Gastrointestinal: Soft and nontender. No distention. Musculoskeletal: No lower extremity tenderness nor edema.  Neurologic:  Normal speech and language. No gross focal neurologic deficits are appreciated.    ED Results / Procedures / Treatments   Labs (all labs ordered are listed, but only abnormal results are displayed) Labs Reviewed  COMPREHENSIVE METABOLIC PANEL - Abnormal; Notable for the following components:      Result Value   Potassium 3.2 (*)    CO2 20 (*)    Glucose, Bld 164 (*)    Calcium  8.7 (*)    AST 56 (*)    ALT 53 (*)    Anion gap 17 (*)    All other components within normal  limits  SALICYLATE LEVEL - Abnormal; Notable for the following components:   Salicylate Lvl <7.0 (*)    All other components within normal limits  CBC WITH DIFFERENTIAL/PLATELET - Abnormal; Notable for the following components:   Platelets 144 (*)    Neutro Abs 8.1 (*)    All other components within normal limits  ACETAMINOPHEN  LEVEL  ETHANOL     EKG  ED ECG REPORT I, Carlin Palin, the attending physician, personally viewed and interpreted this ECG.   Date: 09/10/2023  EKG Time: 11:13  Rate: 86  Rhythm: normal sinus rhythm  Axis: LAD  Intervals:right bundle branch block  ST&T Change: None  PROCEDURES:  Critical Care performed: No  Procedures   MEDICATIONS ORDERED IN ED: Medications  potassium chloride  SA (KLOR-CON  M) CR tablet 40 mEq (has no administration in time range)     IMPRESSION / MDM / ASSESSMENT AND PLAN / ED COURSE  I  reviewed the triage vital signs and the nursing notes.                              68 y.o. male with past medical history of hypertension, diabetes, CAD status post CABG, PAD, CKD, alcohol abuse, and chronic pain syndrome who presents to the ED after waking up feeling sweaty, shaky, and generally weak.  Patient's presentation is most consistent with acute presentation with potential threat to life or bodily function.  Differential diagnosis includes, but is not limited to, stroke, TIA, arrhythmia, anemia, electrolyte abnormality, AKI, UTI.  Patient well-appearing and in no acute distress, vital signs are unremarkable.  He is alert and oriented x 4 on my assessment with no focal neurologic deficits, low suspicion for stroke.  EKG shows no evidence of arrhythmia or ischemia and labs are reassuring with no significant anemia, leukocytosis, electrolyte abnormality, or AKI.  LFTs are unremarkable.  While EMS reported concern for opiate overdose, he is showing no signs of this currently, adamantly denies taking any extra pain medication.  He did receive Narcan  but has now been in the ED for greater than 4 hours with no recurrent signs of opiate toxicity.  No reason suspect intentional overdose at this time and patient does not seem to have any intent to harm himself.  He is appropriate for discharge home with outpatient follow-up given resolution of his symptoms with reassuring workup.  He was counseled to return to the ED for new or worsening symptoms.  Patient agrees with plan.      FINAL CLINICAL IMPRESSION(S) / ED DIAGNOSES   Final diagnoses:  Generalized weakness     Rx / DC Orders   ED Discharge Orders     None        Note:  This document was prepared using Dragon voice recognition software and may include unintentional dictation errors.   Palin Carlin, MD 09/10/23 [phone]

## 2023-09-10 NOTE — Telephone Encounter (Signed)
 Attempted to call [name] [name], our office only prescribes Hydrocodone. However, I see that patient had surgery on 08-06-23, perhaps Oxycodone was prescribed post op.  Did not speak with [name] [name], "call cannot be completed as dialed."

## 2023-09-15 NOTE — Progress Notes (Signed)
 PROVIDER NOTE: Information contained herein reflects review and annotations entered in association with encounter. Interpretation of such information and data should be left to medically-trained personnel. Information provided to patient can be located elsewhere in the medical record under Patient Instructions. Document created using STT-dictation technology, any transcriptional errors that may result from process are unintentional.    Patient: [name] [name].  Service Category: E/M  Provider: Eric DELENA Como, MD  DOB: 1956-06-03  DOS: 09/16/2023  Referring Provider: Donnie Handing, PA  MRN: [id number]  Specialty: Interventional Pain Management  PCP: Donnie Handing, PA  Type: Established Patient  Setting: Ambulatory outpatient    Location: Office  Delivery: Face-to-face     HPI  [name], a 68 y.o. year old male, is here today because of his Chronic pain syndrome [G89.4]. [name] [name] primary complain today is Foot Pain and Leg Pain (Right )  Pertinent problems: [name] [name] has Chronic shoulder pain (3ry area of Pain) (Right); Chronic hip pain (Left); Chronic chest wall pain (1ry area of Pain) (Incisional Midline) (since 04/22/2012); Chronic lower extremity pain (2ry area of Pain) (Bilateral) (R>L); Chronic sacroiliac joint pain (Bilateral) (L>R); Neuropathic pain; Neurogenic pain; Incisional pain; Lumbar facet syndrome (Bilateral) (R>L); Lumbar spondylosis; Keloid skin disorder; Costochondritis; Brachial plexus injury, right; Right sided weakness; Chronic pain syndrome; DDD (degenerative disc disease), lumbosacral; Lumbar facet arthropathy (Multilevel) (Bilateral); 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 Pain Assessment: Severity of  Chronic pain is reported as a 3 /10. Location: Leg Right, Left/start at the feet bilateral uptoards calf bilateral. Onset: More than a month ago. Quality: Burning, Constant, Aching. Timing: Constant. Modifying factor(s): Pain medication and rest. Vitals:  height is 5' 0.75 (1.543 m) and weight is 165 lb (74.8 kg). His temperature is 97.2 F (36.2 C) (abnormal). His blood pressure is 124/65 and his pulse is 85. His respiration is 18 and oxygen saturation is 100%.  BMI: Estimated body mass index is 31.43 kg/m as calculated from the following:   Height as of this encounter: 5' 0.75 (1.543 m).   Weight as of this encounter: 165 lb (74.8 kg). Last encounter: 06/10/2023. Last procedure: Visit date not found.  Reason for encounter: medication management.  Possible incident of overdose.   Discussed the use of AI scribe software for clinical note transcription with the patient, who gave verbal consent to proceed.  History of Present Illness   The patient, with a history of chronic pain managed with hydrocodone , recently experienced an episode of excessive sweating and shakiness, which led to hospitalization. The patient denies intentional overdose, stating he took his medication as prescribed. However, he was administered Narcan , a medication used to reverse opioid overdose, and reportedly improved after administration. The patient also reports recent carotid artery surgery and was concerned that his symptoms might have been indicative of a stroke.  The patient has been taking hydrocodone , which contains acetaminophen , a medication known to stress the liver. Recent lab work revealed elevated liver enzymes, indicating potential liver dysfunction. This has been a consistent finding over the past six months. The patient also takes several other medications, including metoprolol , irbesartan , gabapentin , and atorvastatin , which he has tolerated well over time.  This medication is administered via a weekly  patch, which the patient will need to apply and manage independently.  RTCB: 10/15/2023   Pharmacotherapy Assessment  Analgesic: Hydrocodone /APAP 5/325 mg, 1 tab PO BID (10 mg/day of hydrocodone ) MME/day: 10 mg/day.   Monitoring: [place] PMP: PDMP reviewed during this encounter.       Pharmacotherapy: No side-effects or adverse reactions reported. Compliance: No problems identified. Effectiveness: Clinically acceptable.  Bonner Norris, RN  09/16/2023  1:42 PM  Sign when Signing Visit Nursing Pain Medication Assessment:  Safety precautions to be maintained throughout the outpatient stay will include: orient to surroundings, keep bed in low position, maintain call bell within reach at all times, provide assistance with transfer out of bed and ambulation.  Medication Inspection Compliance: Pill count conducted under aseptic conditions, in front of the patient. Neither the pills nor the bottle was removed from the patient's sight at any time. Once count was completed pills were immediately returned to the patient in their original bottle.  Medication: Hydrocodone /APAP Pill/Patch Count:  5 of 60 pills remain Pill/Patch Appearance: Markings consistent with prescribed medication Bottle Appearance: Standard pharmacy container. Clearly labeled. Filled Date: 34 / 13 / 2024 Last Medication intake:  Today    No results found for: CBDTHCR No results found for: D8THCCBX No results found for: D9THCCBX  UDS:  Summary  Date Value Ref Range Status  02/11/2023 Note  Final    Comment:    ==================================================================== [employer] [facility] 13 (MW) ==================================================================== Test                             Result       Flag       Units  Drug Present and Declared for Prescription Verification   Hydrocodone                     1779         EXPECTED   ng/mg creat   Hydromorphone                   394          EXPECTED    ng/mg creat   Dihydrocodeine                 61           EXPECTED   ng/mg creat   Norhydrocodone                 736          EXPECTED   ng/mg creat    Sources of hydrocodone  include scheduled prescription medications.    Hydromorphone , dihydrocodeine and norhydrocodone are expected    metabolites of hydrocodone . Hydromorphone  and dihydrocodeine are    also available as scheduled prescription medications.  ==================================================================== Test                      Result    Flag   Units      Ref Range   Creatinine              129              mg/dL      >=79 ==================================================================== Declared Medications:  The flagging and interpretation on this report are based on the  following declared medications.  Unexpected results may arise from  inaccuracies in the declared medications.   **Note: The testing scope of this panel includes these medications:   Hydrocodone  (Norco)   **Note: The testing scope of this panel does not  include the  following reported medications:   Acetaminophen  ([place])  Allopurinol  (Zyloprim )  Aspirin   Atorvastatin  (Lipitor)  Cholecalciferol   Clopidogrel  (Plavix )  Gabapentin  (Neurontin )  Insulin  Lelon)  Isosorbide  (Imdur )  Losartan  (Cozaar )  Metoprolol  (Lopressor )  Multivitamin  Naloxone  (Narcan )  Nitroglycerin  (Nitrostat )  Pantoprazole  (Protonix )  Potassium  Semaglutide (Ozempic)  Supplement  Valsartan (Diovan) ==================================================================== For clinical consultation, please call [phone]. ====================================================================       ROS  Constitutional: Denies any fever or chills Gastrointestinal: No reported hemesis, hematochezia, vomiting, or acute GI distress Musculoskeletal: Denies any acute onset joint swelling, redness, loss of ROM, or weakness Neurological: No reported episodes of  acute onset apraxia, aphasia, dysarthria, agnosia, amnesia, paralysis, loss of coordination, or loss of consciousness  Medication Review  Cholecalciferol , Potassium, Semaglutide (1 MG/DOSE), allopurinol , aspirin  EC, atorvastatin , buprenorphine , clopidogrel , folic acid , gabapentin , insulin  degludec, irbesartan , isosorbide  mononitrate, metoprolol  succinate, metoprolol  tartrate, multivitamin with minerals, nitroGLYCERIN , ranolazine , and thiamine   History Review  Allergy: [name] [name] has no known allergies. Drug: [name] [name]  reports no history of drug use. Alcohol:  reports current alcohol use of about 14.0 standard drinks of alcohol per week. Tobacco:  reports that he has quit smoking. His smoking use included cigarettes. He has quit using smokeless tobacco. Social: [name] [name]  reports that he has quit smoking. His smoking use included cigarettes. He has quit using smokeless tobacco. He reports current alcohol use of about 14.0 standard drinks of alcohol per week. He reports that he does not use drugs. Medical:  has a past medical history of Aortic stenosis, Benign esophageal stricture, Cardiomyopathy, secondary (HCC), Coronary artery disease, Diabetes mellitus without complication (HCC), Diverticulitis, GI bleed, Gout, Hemorrhoids, Hyperlipidemia, Hypertension, Sleep apnea, and Tubular adenoma of colon. Surgical: [name] [name]  has a past surgical history that includes Coronary angioplasty with stent; cardiac bypass; Cardiac surgery; Colonoscopy with propofol  (N/A, 04/29/2015); Esophagogastroduodenoscopy (egd) with propofol  (N/A, 04/29/2015); Septoplasty; Cardiac catheterization (Left, 02/03/2016); Coronary artery bypass graft; Colonoscopy with propofol  (N/A, 07/20/2020); Lower Extremity Angiography (Left, 11/20/2021); and Left Heart Cath (N/A, 04/08/2023). Family: family history includes Heart disease in his mother; Hypertension in his father.  Laboratory Chemistry Profile   Renal Lab Results  Component  Value Date   BUN 19 09/10/2023   CREATININE 1.08 09/10/2023   GFRAA >60 05/07/2019   GFRNONAA >60 09/10/2023    Hepatic Lab Results  Component Value Date   AST 56 (H) 09/10/2023   ALT 53 (H) 09/10/2023   ALBUMIN 4.6 09/10/2023   ALKPHOS 84 09/10/2023   HCVAB NON REACTIVE 10/09/2020   LIPASE 39 07/16/2021    Electrolytes Lab Results  Component Value Date   NA 138 09/10/2023   K 3.2 (L) 09/10/2023   CL 101 09/10/2023   CALCIUM  8.7 (L) 09/10/2023   MG 2.2 10/09/2020   PHOS 3.7 10/09/2020    Bone Lab Results  Component Value Date   VD25OH 74.7 05/07/2019   VD125OH2TOT 30.8 10/26/2015    Inflammation (CRP: Acute Phase) (ESR: Chronic Phase) Lab Results  Component Value Date   CRP 0.9 05/07/2019   ESRSEDRATE 41 (H) 05/07/2019   LATICACIDVEN 1.9 10/09/2020         Note: Above Lab results reviewed.  Recent Imaging Review  VAS US  ABI WITH/WO TBI  LOWER EXTREMITY DOPPLER STUDY  Patient Name:  DAVYN [name].  Date of Exam:   05/07/2023 Medical Rec #: [id number]           Accession #:    [id number] Date of  Birth: 01/03/1956            Patient Gender: M Patient Age:   55 years Exam Location:  [facility] Procedure:      VAS US  ABI WITH/WO TBI Referring Phys: ORVIN DARING  --------------------------------------------------------------------------------   Indications: Peripheral artery disease.  High Risk Factors: Hyperlipidemia, Diabetes, past history of smoking, prior MI,                    coronary artery disease.   Vascular Interventions: 11/20/21: Bilateral CIA stents;.  Performing Technologist: Donnice Charnley RVT    Examination Guidelines: A complete evaluation includes at minimum, Doppler waveform signals and systolic blood pressure reading at the level of bilateral brachial, anterior tibial, and posterior tibial arteries, when vessel segments are accessible. Bilateral testing is considered an integral part of a complete examination.  Photoelectric Plethysmograph (PPG) waveforms and toe systolic pressure readings are included as required and additional duplex testing as needed. Limited examinations for reoccurring indications may be performed as noted.    ABI Findings: +---------+------------------+-----+---------+--------+ Right    Rt Pressure (mmHg)IndexWaveform Comment  +---------+------------------+-----+---------+--------+ Brachial 145                                      +---------+------------------+-----+---------+--------+ PTA      160               1.10 triphasic         +---------+------------------+-----+---------+--------+ DP       152               1.05 triphasic         +---------+------------------+-----+---------+--------+ Great Toe136               0.94                   +---------+------------------+-----+---------+--------+  +---------+------------------+-----+----------+-------+ Left     Lt Pressure (mmHg)IndexWaveform  Comment +---------+------------------+-----+----------+-------+ Brachial 142                                      +---------+------------------+-----+----------+-------+ PTA      121               0.83 monophasic        +---------+------------------+-----+----------+-------+ DP       121               0.83 monophasic        +---------+------------------+-----+----------+-------+ Great Toe110               0.76                   +---------+------------------+-----+----------+-------+  +-------+-----------+-----------+------------+------------+ ABI/TBIToday's ABIToday's TBIPrevious ABIPrevious TBI +-------+-----------+-----------+------------+------------+ Right  1.10       0.94       0.90        0.96         +-------+-----------+-----------+------------+------------+ Left   0.83       0.76       0.75        0.71         +-------+-----------+-----------+------------+------------+  Right ABIs  appear increased compared to prior study on 11/02/2022. Left ABIs appear essentially unchanged compared to prior study on 11/02/2022.   Summary: Right: Resting right ankle-brachial index is within normal range. The right  toe-brachial index is normal.  Left: Resting left ankle-brachial index indicates mild left lower extremity arterial disease. The left toe-brachial index is normal.  *See table(s) above for measurements and observations.    Electronically signed by Selinda Gu MD on 05/09/2023 at 9:40:55 AM.      Final   Note: Reviewed        Physical Exam  General appearance: Well nourished, well developed, and well hydrated. In no apparent acute distress Mental status: Alert, oriented x 3 (person, place, & time)       Respiratory: No evidence of acute respiratory distress Eyes: PERLA Vitals: BP 124/65 (Cuff Size: Normal)   Pulse 85   Temp (!) 97.2 F (36.2 C)   Resp 18   Ht 5' 0.75 (1.543 m)   Wt 165 lb (74.8 kg)   SpO2 100%   BMI 31.43 kg/m  BMI: Estimated body mass index is 31.43 kg/m as calculated from the following:   Height as of this encounter: 5' 0.75 (1.543 m).   Weight as of this encounter: 165 lb (74.8 kg). Ideal: Ideal body weight: 51.7 kg (114 lb 0.5 oz) Adjusted ideal body weight: 61 kg (134 lb 6.7 oz)  Assessment   Diagnosis Status  1. Chronic pain syndrome   2. Chronic chest wall pain (1ry area of Pain) (Incisional Midline) (since 04/22/2012)   3. Chronic lower extremity pain (2ry area of Pain) (Bilateral) (R>L)   4. Chronic shoulder pain (3ry area of Pain) (Right)   5. Lumbar facet syndrome (Bilateral) (R>L)   6. Chronic low back pain (Bilateral) (R>L) w/o sciatica   7. Pharmacologic therapy   8. Chronic use of opiate for therapeutic purpose   9. Encounter for medication management   10. Encounter for chronic pain management   11. Elevated liver enzymes    Controlled Controlled Controlled   Updated Problems: Problem  Elevated Liver Enzymes     Plan of Care  Problem-specific:  Assessment and Plan    Medication Overdose Symptoms of disorientation and excessive sleepiness improved with Narcan , likely due to hydrocodone /acetaminophen  accumulation exacerbated by impaired liver function. Explained the overdose risk with compromised liver function. Switch to a buprenorphine  patch and send the prescription to the pharmacy. Instruct on proper application and maintenance of the patch. Advise avoiding acetaminophen -containing medications. Schedule a follow-up in 30 days to assess response.  Impaired Liver Function Elevated liver enzymes for six months indicate ongoing dysfunction, contributing to medication accumulation. Discussed aging effects on liver and kidney function. Recommend primary care physician monitor liver function. Advise informing all healthcare providers about liver condition to avoid hepatotoxic medications.  General Health Maintenance Emphasized regular follow-ups with a primary care physician due to complex medication regimen and liver function concerns. Discussed the importance of informing all healthcare providers about current medications and liver condition. Ensure primary care physician checks liver function regularly. Recommend carrying a list of current medications and medical conditions for emergencies.  Follow-up Schedule a follow-up appointment in 30 days.       [name] [name]. has a current medication list which includes the following long-term medication(s): allopurinol , atorvastatin , [START ON 09/17/2023] buprenorphine , gabapentin , irbesartan , isosorbide  mononitrate, metoprolol  succinate, metoprolol  tartrate, and ranolazine .  Pharmacotherapy (Medications Ordered): Meds ordered this encounter  Medications   buprenorphine  (BUTRANS ) 5 MCG/HR PTWK    Sig: Place 1 patch onto the skin once a week for 28 days.    Dispense:  4 patch    Refill:  0    Chronic  Pain: STOP Act (Not applicable) Fill 1 day  early if closed on refill date. Avoid benzodiazepines within 8 hours of opioids   Orders:  No orders of the defined types were placed in this encounter.  Follow-up plan:   Return in about 29 days (around 10/15/2023) for Eval-day (M,W), (F2F), (MM) for eval of new medicine (Buprenorphine ).      Interventional Therapies  Risk Factors  Considerations:      Planned  Pending:      Under consideration:   Therapeutic bilateral Lumbar facet + bilateral sacroiliac joint RFA #1    Completed:   Palliative left IA hip inj. x1 (05/13/2018) (100/100/75/>75)  Palliative bilateral lumbar facet MBB x2 (02/21/2016)  Palliative bilateral SI Blk x1 (02/21/2016)    Therapeutic  Palliative (PRN) options:   Palliative left intra-articular hip joint injection #2  Palliative bilateral lumbar facet block #3  Palliative bilateral sacroiliac joint block #2    Pharmacotherapy  Nonopioids transfer 07/25/2020: Gabapentin        Recent Visits No visits were found meeting these conditions. Showing recent visits within past 90 days and meeting all other requirements Today's Visits Date Type Provider Dept  09/16/23 Office Visit Tanya Glisson, MD [facility]  Showing today's visits and meeting all other requirements Future Appointments No visits were found meeting these conditions. Showing future appointments within next 90 days and meeting all other requirements  I discussed the assessment and treatment plan with the patient. The patient was provided an opportunity to ask questions and all were answered. The patient agreed with the plan and demonstrated an understanding of the instructions.  Patient advised to call back or seek an in-person evaluation if the symptoms or condition worsens.  Duration of encounter: 30 minutes.  Total time on encounter, as per AMA guidelines included both the face-to-face and non-face-to-face time personally spent by the physician and/or other qualified  health care professional(s) on the day of the encounter (includes time in activities that require the physician or other qualified health care professional and does not include time in activities normally performed by clinical staff). Physician's time may include the following activities when performed: Preparing to see the patient (e.g., pre-charting review of records, searching for previously ordered imaging, lab work, and nerve conduction tests) Review of prior analgesic pharmacotherapies. Reviewing PMP Interpreting ordered tests (e.g., lab work, imaging, nerve conduction tests) Performing post-procedure evaluations, including interpretation of diagnostic procedures Obtaining and/or reviewing separately obtained history Performing a medically appropriate examination and/or evaluation Counseling and educating the patient/family/caregiver Ordering medications, tests, or procedures Referring and communicating with other health care professionals (when not separately reported) Documenting clinical information in the electronic or other health record Independently interpreting results (not separately reported) and communicating results to the patient/ family/caregiver Care coordination (not separately reported)  Note by: Glisson DELENA Tanya, MD Date: 09/16/2023; Time: 2:15 PM

## 2023-09-15 NOTE — Patient Instructions (Signed)

## 2023-09-16 ENCOUNTER — Encounter: Payer: Self-pay | Admitting: Pain Medicine

## 2023-09-16 ENCOUNTER — Ambulatory Visit: Payer: Medicare Other | Attending: Pain Medicine | Admitting: Pain Medicine

## 2023-09-16 VITALS — BP 124/65 | HR 85 | Temp 97.2°F | Resp 18 | Ht 60.75 in | Wt 165.0 lb

## 2023-09-16 DIAGNOSIS — G8929 Other chronic pain: Secondary | ICD-10-CM | POA: Diagnosis present

## 2023-09-16 DIAGNOSIS — M545 Low back pain, unspecified: Secondary | ICD-10-CM | POA: Diagnosis present

## 2023-09-16 DIAGNOSIS — Z79891 Long term (current) use of opiate analgesic: Secondary | ICD-10-CM | POA: Diagnosis present

## 2023-09-16 DIAGNOSIS — G894 Chronic pain syndrome: Secondary | ICD-10-CM

## 2023-09-16 DIAGNOSIS — M47816 Spondylosis without myelopathy or radiculopathy, lumbar region: Secondary | ICD-10-CM

## 2023-09-16 DIAGNOSIS — Z79899 Other long term (current) drug therapy: Secondary | ICD-10-CM | POA: Diagnosis present

## 2023-09-16 DIAGNOSIS — R0789 Other chest pain: Secondary | ICD-10-CM | POA: Diagnosis present

## 2023-09-16 DIAGNOSIS — M79604 Pain in right leg: Secondary | ICD-10-CM | POA: Diagnosis not present

## 2023-09-16 DIAGNOSIS — M25511 Pain in right shoulder: Secondary | ICD-10-CM | POA: Insufficient documentation

## 2023-09-16 DIAGNOSIS — R748 Abnormal levels of other serum enzymes: Secondary | ICD-10-CM | POA: Diagnosis present

## 2023-09-16 DIAGNOSIS — M79605 Pain in left leg: Secondary | ICD-10-CM | POA: Insufficient documentation

## 2023-09-16 MED ORDER — BUPRENORPHINE 5 MCG/HR TD PTWK: 1.0000 | MEDICATED_PATCH | TRANSDERMAL | 0 refills | Status: DC

## 2023-09-16 NOTE — Progress Notes (Signed)
 Nursing Pain Medication Assessment:  Safety precautions to be maintained throughout the outpatient stay will include: orient to surroundings, keep bed in low position, maintain call bell within reach at all times, provide assistance with transfer out of bed and ambulation.  Medication Inspection Compliance: Pill count conducted under aseptic conditions, in front of the patient. Neither the pills nor the bottle was removed from the patient's sight at any time. Once count was completed pills were immediately returned to the patient in their original bottle.  Medication: Hydrocodone /APAP Pill/Patch Count:  5 of 60 pills remain Pill/Patch Appearance: Markings consistent with prescribed medication Bottle Appearance: Standard pharmacy container. Clearly labeled. Filled Date: 30 / 13 / 2024 Last Medication intake:  Today

## 2023-09-17 DIAGNOSIS — R531 Weakness: Secondary | ICD-10-CM | POA: Insufficient documentation

## 2023-09-19 ENCOUNTER — Telehealth: Payer: Self-pay | Admitting: Pain Medicine

## 2023-09-19 NOTE — Telephone Encounter (Signed)
Medication has been approved since 01/15. Patient called back  . No answer. LVM telling patient to re-check with pharmacy.

## 2023-09-19 NOTE — Telephone Encounter (Signed)
Patient states [facility] told him he needs PA for the buprenorphine patch script. Please let patient know when this is completed

## 2023-09-21 ENCOUNTER — Emergency Department: Payer: Medicare Other

## 2023-09-21 ENCOUNTER — Other Ambulatory Visit: Payer: Self-pay

## 2023-09-21 DIAGNOSIS — Z20822 Contact with and (suspected) exposure to covid-19: Secondary | ICD-10-CM | POA: Diagnosis not present

## 2023-09-21 DIAGNOSIS — E1122 Type 2 diabetes mellitus with diabetic chronic kidney disease: Secondary | ICD-10-CM | POA: Diagnosis not present

## 2023-09-21 DIAGNOSIS — I2510 Atherosclerotic heart disease of native coronary artery without angina pectoris: Secondary | ICD-10-CM | POA: Diagnosis not present

## 2023-09-21 DIAGNOSIS — E8770 Fluid overload, unspecified: Secondary | ICD-10-CM | POA: Diagnosis not present

## 2023-09-21 DIAGNOSIS — N189 Chronic kidney disease, unspecified: Secondary | ICD-10-CM | POA: Insufficient documentation

## 2023-09-21 DIAGNOSIS — R0609 Other forms of dyspnea: Secondary | ICD-10-CM | POA: Diagnosis not present

## 2023-09-21 DIAGNOSIS — I129 Hypertensive chronic kidney disease with stage 1 through stage 4 chronic kidney disease, or unspecified chronic kidney disease: Secondary | ICD-10-CM | POA: Insufficient documentation

## 2023-09-21 DIAGNOSIS — R0602 Shortness of breath: Secondary | ICD-10-CM | POA: Diagnosis present

## 2023-09-21 LAB — CBC
HCT: 37.7 % — ABNORMAL LOW (ref 39.0–52.0)
Hemoglobin: 12.1 g/dL — ABNORMAL LOW (ref 13.0–17.0)
MCH: 28.7 pg (ref 26.0–34.0)
MCHC: 32.1 g/dL (ref 30.0–36.0)
MCV: 89.5 fL (ref 80.0–100.0)
Platelets: 120 10*3/uL — ABNORMAL LOW (ref 150–400)
RBC: 4.21 MIL/uL — ABNORMAL LOW (ref 4.22–5.81)
RDW: 15.1 % (ref 11.5–15.5)
WBC: 5.7 10*3/uL (ref 4.0–10.5)
nRBC: 0 % (ref 0.0–0.2)

## 2023-09-21 LAB — BASIC METABOLIC PANEL
Anion gap: 11 (ref 5–15)
BUN: 24 mg/dL — ABNORMAL HIGH (ref 8–23)
CO2: 25 mmol/L (ref 22–32)
Calcium: 9.1 mg/dL (ref 8.9–10.3)
Chloride: 103 mmol/L (ref 98–111)
Creatinine, Ser: 1.24 mg/dL (ref 0.61–1.24)
GFR, Estimated: >60 mL/min (ref >60)
Glucose, Bld: 96 mg/dL (ref 70–99)
Potassium: 4.4 mmol/L (ref 3.5–5.1)
Sodium: 139 mmol/L (ref 135–145)

## 2023-09-21 LAB — TROPONIN I (HIGH SENSITIVITY): Troponin I (High Sensitivity): 21 ng/L — ABNORMAL HIGH (ref <18)

## 2023-09-21 NOTE — ED Triage Notes (Signed)
Pt to ed from home via POV for SOB that started a couple days ago. Pt is caox4, in no acute distress in triage as he is working a cross word puzzle and is making jokes. Pt speaking in complete sentences,

## 2023-09-22 ENCOUNTER — Emergency Department: Payer: Medicare Other

## 2023-09-22 ENCOUNTER — Emergency Department
Admission: EM | Admit: 2023-09-22 | Discharge: 2023-09-22 | Disposition: A | Discharge status: Home / Self Care | Payer: Medicare Other | Attending: Emergency Medicine | Admitting: Emergency Medicine

## 2023-09-22 DIAGNOSIS — E8770 Fluid overload, unspecified: Secondary | ICD-10-CM | POA: Diagnosis not present

## 2023-09-22 DIAGNOSIS — R0609 Other forms of dyspnea: Secondary | ICD-10-CM

## 2023-09-22 DIAGNOSIS — E8779 Other fluid overload: Secondary | ICD-10-CM

## 2023-09-22 LAB — RESP PANEL BY RT-PCR (RSV, FLU A&B, COVID)  RVPGX2
Influenza A by PCR: NEGATIVE
Influenza B by PCR: NEGATIVE
Resp Syncytial Virus by PCR: NEGATIVE
SARS Coronavirus 2 by RT PCR: NEGATIVE

## 2023-09-22 LAB — D-DIMER, QUANTITATIVE: D-Dimer, Quant: 1.03 ug{FEU}/mL — ABNORMAL HIGH (ref 0.00–0.50)

## 2023-09-22 LAB — BRAIN NATRIURETIC PEPTIDE: B Natriuretic Peptide: 392.7 pg/mL — ABNORMAL HIGH (ref 0.0–100.0)

## 2023-09-22 LAB — TROPONIN I (HIGH SENSITIVITY): Troponin I (High Sensitivity): 18 ng/L — ABNORMAL HIGH (ref <18)

## 2023-09-22 MED ORDER — FUROSEMIDE 10 MG/ML IJ SOLN
40.0000 mg | Freq: Once | INTRAMUSCULAR | Status: AC
  Administered 2023-09-22: 40 mg via INTRAVENOUS
  Filled 2023-09-22: qty 4

## 2023-09-22 MED ORDER — IOHEXOL 350 MG/ML SOLN
100.0000 mL | Freq: Once | INTRAVENOUS | Status: AC | PRN
  Administered 2023-09-22: 100 mL via INTRAVENOUS

## 2023-09-22 MED ORDER — FUROSEMIDE 20 MG PO TABS: 20.0000 mg | ORAL_TABLET | Freq: Every day | ORAL | 0 refills | Status: DC

## 2023-09-22 MED ORDER — IRBESARTAN 150 MG PO TABS
150.0000 mg | ORAL_TABLET | ORAL | Status: AC
Start: 2023-09-22 — End: 2023-09-22
  Administered 2023-09-22: 75 mg via ORAL
  Filled 2023-09-22: qty 1

## 2023-09-22 MED ORDER — FUROSEMIDE 8 MG/ML PO SOLN: 40.0000 mg | ORAL | Status: DC

## 2023-09-22 NOTE — ED Notes (Signed)
Pt contact made and myself introduced. Pt is CAOx4, breathing normally, and normal in color. Pt appears to be relaxed and does not appear to be in any obvious distress at this time.

## 2023-09-22 NOTE — ED Provider Notes (Signed)
[facility] Provider Note    Event Date/Time   First MD Initiated Contact with Patient 09/22/23 0012     (approximate)   History   Shortness of Breath   HPI  [name] [name]. is a 68 y.o. male history of peripheral artery disease chronic kidney disease alcohol abuse, coronary disease diabetes hypertension   Patient has been is noticing for few days that he feels just a little bit short of breath.  Happens when he is exerting himself.  No pain or discomfort.  He has noticed a little bit of swelling around both of his ankles, and has had that before but reports it was a while ago.  Reports sometime in the distant past he thought he took fluid pills for a few days but it has been a while  He has not taken his blood pressure medicine this evening  Does have a history of aortic valvular disease.  Physical Exam   Triage Vital Signs: ED Triage Vitals  Encounter Vitals Group     BP 09/21/23 1904 (!) 179/100     Systolic BP Percentile --      Diastolic BP Percentile --      Pulse Rate 09/21/23 1904 96     Resp 09/21/23 1904 16     Temp 09/21/23 1904 98 F (36.7 C)     Temp Source 09/21/23 1904 Oral     SpO2 09/21/23 1904 100 %     Weight 09/21/23 1902 171 lb 15.3 oz (78 kg)     Height 09/21/23 1902 5' (1.524 m)     Head Circumference --      Peak Flow --      Pain Score 09/21/23 1902 0     Pain Loc --      Pain Education --      Exclude from Growth Chart --     Most recent vital signs: Vitals:   09/21/23 1904 09/22/23 0012  BP: (!) 179/100 (!) 174/81  Pulse: 96 93  Resp: 16 18  Temp: 98 F (36.7 C) 98 F (36.7 C)  SpO2: 100% 99%   Denies any fever.  Denies any productive cough.  No body aches or chills  General: Awake, no distress.  CV:  Good peripheral perfusion.  Normal tones and rate Resp:  Normal effort.  Clear bilateral with normal work of breathing Abd:  No distention.  Soft nondistended Other:  No lower extremity venous cords  or congestion, he has trace edema at both ankles around his sock lines   ED Results / Procedures / Treatments   Labs (all labs ordered are listed, but only abnormal results are displayed) Labs Reviewed  BASIC METABOLIC PANEL - Abnormal; Notable for the following components:      Result Value   BUN 24 (*)    All other components within normal limits  CBC - Abnormal; Notable for the following components:   RBC 4.21 (*)    Hemoglobin 12.1 (*)    HCT 37.7 (*)    Platelets 120 (*)    All other components within normal limits  BRAIN NATRIURETIC PEPTIDE - Abnormal; Notable for the following components:   B Natriuretic Peptide 392.7 (*)    All other components within normal limits  D-DIMER, QUANTITATIVE (NOT AT [facility]) - Abnormal; Notable for the following components:   D-Dimer, Quant 1.03 (*)    All other components within normal limits  TROPONIN I (HIGH SENSITIVITY) - Abnormal; Notable for  the following components:   Troponin I (High Sensitivity) 21 (*)    All other components within normal limits  TROPONIN I (HIGH SENSITIVITY) - Abnormal; Notable for the following components:   Troponin I (High Sensitivity) 18 (*)    All other components within normal limits  RESP PANEL BY RT-PCR (RSV, FLU A&B, COVID)  [id number]   Labs demonstrate initial and repeat troponin just slightly elevated but in keeping with his historical, improved from prior  D-dimer positive  BNP is elevated  EKG  Interpreted by me at 1855 heart rate 95 QRS 140 QTc 500 Normal sinus rhythm right bundle branch block.  Aberrant conduction. No stemi or frank ischemia.  ECG very similar nature to previous from January 7 of this year    RADIOLOGY  CT Angio Chest PE W and/or Wo Contrast Result Date: 09/22/2023 CLINICAL DATA:  Shortness of breath for a few days. EXAM: CT ANGIOGRAPHY CHEST WITH CONTRAST TECHNIQUE: Multidetector CT imaging of the chest was performed using the standard protocol during bolus administration of  intravenous contrast. Multiplanar CT image reconstructions and MIPs were obtained to evaluate the vascular anatomy. RADIATION DOSE REDUCTION: This exam was performed according to the departmental dose-optimization program which includes automated exposure control, adjustment of the mA and/or kV according to patient size and/or use of iterative reconstruction technique. CONTRAST:  OMNIPAQUE IOHEXOL 350 MG/ML SOLN COMPARISON:  Radiograph 09/21/2023 and CT 07/17/2019 FINDINGS: Cardiovascular: Cardiomegaly. No pericardial effusion. Sternotomy and CABG. Coronary artery and aortic atherosclerotic calcification. Negative for acute pulmonary embolism. Mediastinum/Nodes: Circumferential wall thickening of the lower esophagus. Bowing of the posterior trachea compatible with expiration. Shotty mediastinal lymph nodes are likely reactive. Lungs/Pleura: Mild emphysema. Small bilateral pleural effusions and associated atelectasis. Hypoventilation changes in the lower lungs. No pneumothorax. Upper Abdomen: Nodular hepatic contour.  No acute abnormality. Musculoskeletal: No acute fracture. Review of the MIP images confirms the above findings. IMPRESSION: 1. Negative for acute pulmonary embolism. 2. Small bilateral pleural effusions and associated atelectasis. 3. Circumferential wall thickening of the lower esophagus, which can be seen with esophagitis. 4. Nodular hepatic contour compatible with cirrhosis. Aortic Atherosclerosis (ICD10-I70.0) and Emphysema (ICD10-J43.9). Electronically Signed   By: Minerva Fester M.D.   On: 09/22/2023 02:39   DG Chest 2 View Result Date: 09/21/2023 CLINICAL DATA:  Dyspnea EXAM: CHEST - 2 VIEW COMPARISON:  04/07/2023 FINDINGS: The lungs are well expanded. Minimal left basilar atelectasis or scarring. Lungs are otherwise clear. No pneumothorax or pleural effusion. Coronary artery bypass grafting has been performed. Cardiac size is within normal limits. Pulmonary vascularity is normal. No  acute bone abnormality IMPRESSION: 1. Minimal left basilar atelectasis or scarring. Electronically Signed   By: Helyn Numbers M.D.   On: 09/21/2023 19:25      PROCEDURES:  Critical Care performed: No  Procedures   MEDICATIONS ORDERED IN ED: Medications  irbesartan (AVAPRO) tablet 150 mg (75 mg Oral Given 09/22/23 0137)  furosemide (LASIX) injection 40 mg (40 mg Intravenous Given 09/22/23 0124)  iohexol (OMNIPAQUE) 350 MG/ML injection 100 mL (100 mLs Intravenous Contrast Given 09/22/23 0206)     IMPRESSION / MDM / ASSESSMENT AND PLAN / ED COURSE  I reviewed the triage vital signs and the nursing notes.                              Differential diagnosis includes, but is not limited to, possible mild volume overload mild CHF type picture, his is  hypertension but also reports he has not taken his evening blood pressure medicine, no associated chest pain, very mild exertional dyspnea.  Chest x-ray negative for infiltrate or pneumothorax.  No chest pain symptoms.  No findings would be highly suggestive of ACS.  D-dimer elevated CT PE study completed  BNP elevated.  Also with his peripheral edema I suspect he may have mild hypervolemia.  Patient's presentation is most consistent with acute complicated illness / injury requiring diagnostic workup.   ----------------------------------------- 3:06 AM on 09/22/2023 ----------------------------------------- Patient is urinated several times now.  Reassessment for reevaluation reassuring.  Resting comfortably without distress normal on no labored respirations with normal oxygen saturation  I feel at this point patient likely suffering from mild volume overload.  Will prescribe low-dose Lasix for the next 5 days, recommended he follow-up closely with Dr. Cassie Freer as discussed careful return precautions.  Patient very agreeable with the plan.  Urgent referral placed to follow-up with his cardiology team as well  Return precautions and  treatment recommendations and follow-up discussed with the patient who is agreeable with the plan.      FINAL CLINICAL IMPRESSION(S) / ED DIAGNOSES   Final diagnoses:  Volume overload state of heart  Dyspnea on exertion     Rx / DC Orders   ED Discharge Orders          Ordered    Ambulatory referral to Cardiology        09/22/23 0304    furosemide (LASIX) 20 MG tablet  Daily        09/22/23 0305             Note:  This document was prepared using Dragon voice recognition software and may include unintentional dictation errors.   Sharyn Creamer, MD 09/22/23 [id number]

## 2023-10-14 NOTE — Progress Notes (Signed)
PROVIDER NOTE: Information contained herein reflects review and annotations entered in association with encounter. Interpretation of such information and data should be left to medically-trained personnel. Information provided to patient can be located elsewhere in the medical record under "Patient Instructions". Document created using STT-dictation technology, any transcriptional errors that may result from process are unintentional.    Patient: [name] [name].  Service Category: E/M  Provider: Oswaldo Done, MD  DOB: 07-Jan-1956  DOS: 10/16/2023  Referring Provider: Shane Crutch, [place]  MRN: [id number]  Specialty: Interventional Pain Management  PCP: Shane Crutch, PA  Type: Established Patient  Setting: Ambulatory outpatient    Location: Office  Delivery: Face-to-face     HPI  Mr. Laurie Lovejoy., a 68 y.o. year old male, is here today because of his Chronic pain syndrome [G89.4]. Mr. Rayman primary complain today is Leg Pain (L>R)  Pertinent problems: Mr. Mendenhall has Chronic shoulder pain (3ry area of Pain) (Right); Chronic hip pain (Left); Chronic chest wall pain (1ry area of Pain) (Incisional Midline) (since 04/22/2012); Chronic lower extremity pain (2ry area of Pain) (Bilateral) (R>L); Chronic sacroiliac joint pain (Bilateral) (L>R); Neuropathic pain; Neurogenic pain; Incisional pain; Lumbar facet syndrome (Bilateral) (R>L); Lumbar spondylosis; Keloid skin disorder; Costochondritis; Brachial plexus injury, right; Right sided weakness; Chronic pain syndrome; DDD (degenerative disc disease), lumbosacral; Lumbar facet arthropathy (Multilevel) (Bilateral); 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 Pain Assessment: Severity of Chronic pain is  reported as a 4 /10. Location: Leg Lower, Posterior/Start at the feet bilateral uptoards calf bilateral. Onset: More than a month ago. Quality: Burning, Constant, Aching. Timing: Constant. Modifying factor(s): Pain mtach and rest. Vitals:  height is 5' 3.75" (1.619 m) and weight is 165 lb (74.8 kg). His temporal temperature is 97.5 F (36.4 C) (abnormal). His blood pressure is 146/78 (abnormal) and his pulse is 87. His respiration is 18 and oxygen saturation is 95%.  BMI: Estimated body mass index is 28.54 kg/m as calculated from the following:   Height as of this encounter: 5' 3.75" (1.619 m).   Weight as of this encounter: 165 lb (74.8 kg). Last encounter: 09/16/2023. Last procedure: Visit date not found.  Reason for encounter: medication management.  The patient indicates doing well with the current medication regimen. No adverse reactions or side effects reported to the medications.   Routine UDS ordered today.   Discussed the use of AI scribe software for clinical note transcription with the patient, who gave verbal consent to proceed.  History of Present Illness   [name] [name]. "[name] [name]" is a 68 year old male who presents with side effects from a new medication patch.  He started using a new medication patch on September 23, 2023, after a delay due to insurance and pharmacy issues. The patch is changed weekly. Since starting the patch, he feels 'loopier,' a nonspecific symptom. He is unsure if this sensation is due to the strength of the medication or its different formulation, as it is an agonist-antagonist opioid, unlike his previous medication.  Over the past four to five days, he has experienced changes in his blood sugar levels. He has not needed to take his usual insulin shots, as his blood sugar has been stable in the mornings. He typically administered 50 units of Tresiba in the morning and at  night but has reduced his nighttime dose to zero after noticing lower blood sugar levels.  He continues to take Ozempic as usual. He acknowledges that fluctuations in blood sugar can cause feelings of dizziness or being 'woozy.'     RTCB: 01/08/2024   Pharmacotherapy Assessment  Analgesic: Buprenorphine 5 mcg/h patch q. 7 days (started on 09/23/2023). MME/day: (0.12 mg).   Monitoring: [place] PMP: PDMP reviewed during this encounter.       Pharmacotherapy: No side-effects or adverse reactions reported. Compliance: No problems identified. Effectiveness: Clinically acceptable.  Earlyne Iba, RN  10/16/2023  2:52 PM  Sign when Signing Visit Nursing Pain Medication Assessment:  Safety precautions to be maintained throughout the outpatient stay will include: orient to surroundings, keep bed in low position, maintain call bell within reach at all times, provide assistance with transfer out of bed and ambulation.  Medication Inspection Compliance: Pill count conducted under aseptic conditions, in front of the patient. Neither the pills nor the bottle was removed from the patient's sight at any time. Once count was completed pills were immediately returned to the patient in their original bottle.  Medication: Buprenorphine (Suboxone) Pill/Patch Count: 0 patches  Pill/Patch Appearance: Markings consistent with prescribed medication Bottle Appearance: Standard pharmacy container. Clearly labeled. Filled Date: Patient did not bring label that was on the box. Unsure of filled date. Patient informed to bring label next time.  Last Medication intake:  Today    No results found for: "CBDTHCR" No results found for: "D8THCCBX" No results found for: "D9THCCBX"  UDS:  Summary  Date Value Ref Range Status  02/11/2023 Note  Final    Comment:    ==================================================================== Tox[employer] Select 13 (MW) ==================================================================== Test                             Result       Flag       Units  Drug Present and Declared  for Prescription Verification   Hydrocodone                    1779         EXPECTED   ng/mg creat   Hydromorphone                  394          EXPECTED   ng/mg creat   Dihydrocodeine                 61           EXPECTED   ng/mg creat   Norhydrocodone                 736          EXPECTED   ng/mg creat    Sources of hydrocodone include scheduled prescription medications.    Hydromorphone, dihydrocodeine and norhydrocodone are expected    metabolites of hydrocodone. Hydromorphone and dihydrocodeine are    also available as scheduled prescription medications.  ==================================================================== Test                      Result    Flag   Units      Ref Range   Creatinine              129              mg/dL      >=16 ==================================================================== Declared  Medications:  The flagging and interpretation on this report are based on the  following declared medications.  Unexpected results may arise from  inaccuracies in the declared medications.   **Note: The testing scope of this panel includes these medications:   Hydrocodone (Norco)   **Note: The testing scope of this panel does not include the  following reported medications:   Acetaminophen (Norco)  Allopurinol (Zyloprim)  Aspirin  Atorvastatin (Lipitor)  Cholecalciferol  Clopidogrel (Plavix)  Gabapentin (Neurontin)  Insulin Evaristo Bury)  Isosorbide (Imdur)  Losartan (Cozaar)  Metoprolol (Lopressor)  Multivitamin  Naloxone (Narcan)  Nitroglycerin (Nitrostat)  Pantoprazole (Protonix)  Potassium  Semaglutide (Ozempic)  Supplement  Valsartan (Diovan) ==================================================================== For clinical consultation, please call [phone]. ====================================================================       ROS  Constitutional: Denies any fever or chills Gastrointestinal: No reported hemesis, hematochezia,  vomiting, or acute GI distress Musculoskeletal: Denies any acute onset joint swelling, redness, loss of ROM, or weakness Neurological: No reported episodes of acute onset apraxia, aphasia, dysarthria, agnosia, amnesia, paralysis, loss of coordination, or loss of consciousness  Medication Review  Cholecalciferol, Potassium, Semaglutide (1 MG/DOSE), allopurinol, aspirin EC, atorvastatin, buprenorphine, clopidogrel, folic acid, furosemide, gabapentin, insulin degludec, irbesartan, isosorbide mononitrate, metoprolol succinate, metoprolol tartrate, multivitamin with minerals, naloxone, nitroGLYCERIN, ranolazine, and thiamine  History Review  Allergy: Mr. Weldon has no known allergies. Drug: Mr. Okuda  reports no history of drug use. Alcohol:  reports current alcohol use of about 14.0 standard drinks of alcohol per week. Tobacco:  reports that he has quit smoking. His smoking use included cigarettes. He has quit using smokeless tobacco. Social: Mr. Pressnell  reports that he has quit smoking. His smoking use included cigarettes. He has quit using smokeless tobacco. He reports current alcohol use of about 14.0 standard drinks of alcohol per week. He reports that he does not use drugs. Medical:  has a past medical history of Aortic stenosis, Benign esophageal stricture, Cardiomyopathy, secondary (HCC), Coronary artery disease, Diabetes mellitus without complication (HCC), Diverticulitis, GI bleed, Gout, Hemorrhoids, Hyperlipidemia, Hypertension, Sleep apnea, and Tubular adenoma of colon. Surgical: Mr. Gosney  has a past surgical history that includes Coronary angioplasty with stent; cardiac bypass; Cardiac surgery; Colonoscopy with propofol (N/A, 04/29/2015); Esophagogastroduodenoscopy (egd) with propofol (N/A, 04/29/2015); Septoplasty; Cardiac catheterization (Left, 02/03/2016); Coronary artery bypass graft; Colonoscopy with propofol (N/A, 07/20/2020); Lower Extremity Angiography (Left, 11/20/2021); and Left  Heart Cath (N/A, 04/08/2023). Family: family history includes Heart disease in his mother; Hypertension in his father.  Laboratory Chemistry Profile   Renal Lab Results  Component Value Date   BUN 24 (H) 09/21/2023   CREATININE 1.24 09/21/2023   GFRAA >60 05/07/2019   GFRNONAA >60 09/21/2023    Hepatic Lab Results  Component Value Date   AST 56 (H) 09/10/2023   ALT 53 (H) 09/10/2023   ALBUMIN 4.6 09/10/2023   ALKPHOS 84 09/10/2023   HCVAB NON REACTIVE 10/09/2020   LIPASE 39 07/16/2021    Electrolytes Lab Results  Component Value Date   NA 139 09/21/2023   K 4.4 09/21/2023   CL 103 09/21/2023   CALCIUM 9.1 09/21/2023   MG 2.2 10/09/2020   PHOS 3.7 10/09/2020    Bone Lab Results  Component Value Date   VD25OH 74.7 05/07/2019   VD125OH2TOT 30.8 10/26/2015    Inflammation (CRP: Acute Phase) (ESR: Chronic Phase) Lab Results  Component Value Date   CRP 0.9 05/07/2019   ESRSEDRATE 41 (H) 05/07/2019   LATICACIDVEN 1.9 10/09/2020  Note: Above Lab results reviewed.  Recent Imaging Review  CT Angio Chest PE W and/or Wo Contrast CLINICAL DATA:  Shortness of breath for a few days.  EXAM: CT ANGIOGRAPHY CHEST WITH CONTRAST  TECHNIQUE: Multidetector CT imaging of the chest was performed using the standard protocol during bolus administration of intravenous contrast. Multiplanar CT image reconstructions and MIPs were obtained to evaluate the vascular anatomy.  RADIATION DOSE REDUCTION: This exam was performed according to the departmental dose-optimization program which includes automated exposure control, adjustment of the mA and/or kV according to patient size and/or use of iterative reconstruction technique.  CONTRAST:  OMNIPAQUE IOHEXOL 350 MG/ML SOLN  COMPARISON:  Radiograph 09/21/2023 and CT 07/17/2019  FINDINGS: Cardiovascular: Cardiomegaly. No pericardial effusion. Sternotomy and CABG. Coronary artery and aortic atherosclerotic  calcification.  Negative for acute pulmonary embolism.  Mediastinum/Nodes: Circumferential wall thickening of the lower esophagus. Bowing of the posterior trachea compatible with expiration. Shotty mediastinal lymph nodes are likely reactive.  Lungs/Pleura: Mild emphysema. Small bilateral pleural effusions and associated atelectasis. Hypoventilation changes in the lower lungs. No pneumothorax.  Upper Abdomen: Nodular hepatic contour.  No acute abnormality.  Musculoskeletal: No acute fracture.  Review of the MIP images confirms the above findings.  IMPRESSION: 1. Negative for acute pulmonary embolism. 2. Small bilateral pleural effusions and associated atelectasis. 3. Circumferential wall thickening of the lower esophagus, which can be seen with esophagitis. 4. Nodular hepatic contour compatible with cirrhosis.  Aortic Atherosclerosis (ICD10-I70.0) and Emphysema (ICD10-J43.9).  Electronically Signed   By: Minerva Fester M.D.   On: 09/22/2023 02:39 Note: Reviewed        Physical Exam  General appearance: Well nourished, well developed, and well hydrated. In no apparent acute distress Mental status: Alert, oriented x 3 (person, place, & time)       Respiratory: No evidence of acute respiratory distress Eyes: PERLA Vitals: BP (!) 146/78 (Cuff Size: Normal)   Pulse 87   Temp (!) 97.5 F (36.4 C) (Temporal)   Resp 18   Ht 5' 3.75" (1.619 m)   Wt 165 lb (74.8 kg)   SpO2 95%   BMI 28.54 kg/m  BMI: Estimated body mass index is 28.54 kg/m as calculated from the following:   Height as of this encounter: 5' 3.75" (1.619 m).   Weight as of this encounter: 165 lb (74.8 kg). Ideal: Ideal body weight: 58.6 kg (129 lb 3.9 oz) Adjusted ideal body weight: 65.1 kg (143 lb 8.8 oz)  Assessment   Diagnosis Status  1. Chronic pain syndrome   2. Chronic chest wall pain (1ry area of Pain) (Incisional Midline) (since 04/22/2012)   3. Chronic lower extremity pain (2ry area of Pain)  (Bilateral) (R>L)   4. Chronic shoulder pain (3ry area of Pain) (Right)   5. Lumbar facet syndrome (Bilateral) (R>L)   6. Chronic low back pain (Bilateral) (R>L) w/o sciatica   7. Pharmacologic therapy   8. Chronic use of opiate for therapeutic purpose   9. Encounter for medication management   10. Encounter for chronic pain management    Controlled Controlled Controlled   Updated Problems: Problem  Type 2 Diabetes Mellitus With Hyperglycemia (Hcc)  Intermediate Coronary Syndrome (Hcc)  Mixed Hyperlipidemia  Unspecified Injury of Unspecified Kidney, Initial Encounter  Benign Essential Hypertension  Weakness  Occlusion and Stenosis of Right Carotid Artery  Preoperative Evaluation of A Medical Condition to Rule Out Surgical Contraindications (Tar Required)  Acute Non-St Segment Elevation Myocardial Infarction (Hcc)  Encounter for Counseling  Moderate Aortic Valve Stenosis  Personal History of Covid-19  Personal History of Other Diseases of The Digestive System  Dm2 (Diabetes Mellitus, Type 2) (Hcc)   Last Assessment & Plan: Formatting of this note might be different from the original. blood glucose control important in reducing the progression of atherosclerotic disease. Also, involved in wound healing. On appropriate medications.   History of Fall  Laceration Without Foreign Body of Unspecified Finger Without Damage to Nail, Subsequent Encounter  Body Mass Index 31.0-31.9, Adult  Preventative Health Care  Dry Mouth  Glucosuria  Nocturia  Angina Pectoris, Unspecified (Hcc)  Screening for Thyroid Disorder  Stasis Dermatitis  Coronary Angioplasty Status  Elevated Prostate Specific Antigen (Psa)  Presence of Coronary Angioplasty Implant and Graft  Gout  Herpes Zoster  Encounter for Screening for Malignant Neoplasm of Colon   Plan of Care  Problem-specific:  Assessment and Plan    Opioid Therapy Adjustment   Currently using a new opioid patch (agonist-antagonist) for  pain management, he reports feeling 'loopier,' likely due to the new medication. This sensation is nonspecific and could result from various factors, including the adjustment period. The reduced risk of respiratory depression with the new medication was emphasized. Age-related decline in kidney and liver function may necessitate medication adjustments. It is suggested to continue the current regimen to allow time for adjustment and inform the primary care physician of symptoms for further evaluation. Continue the current opioid patch regimen, monitor symptoms, and report to the primary care physician. A 61-month prescription for the opioid patch is provided.  Diabetes Mellitus   Blood glucose levels have improved, and insulin injections have not been required for the past 4-5 days. He is using Ozempic and has reduced [place] dosage from 50 units to 0 units at night. Discussed the potential indirect effect of better pain control on blood glucose levels due to reduced adrenaline secretion. Further investigation is needed to understand the relationship between the new opioid medication and blood glucose levels. Monitor blood glucose levels regularly, continue Ozempic as prescribed, and research potential interactions between buprenorphine and blood glucose levels.      Mr. Tyquan Carmickle. has a current medication list which includes the following long-term medication(s): allopurinol, atorvastatin, buprenorphine, [START ON 11/13/2023] buprenorphine, [START ON 12/11/2023] buprenorphine, furosemide, irbesartan, isosorbide mononitrate, metoprolol succinate, metoprolol tartrate, ranolazine, and gabapentin.  Pharmacotherapy (Medications Ordered): Meds ordered this encounter  Medications   buprenorphine (BUTRANS) 5 MCG/HR PTWK    Sig: Place 1 patch onto the skin once a week for 28 days.    Dispense:  4 patch    Refill:  0    Chronic Pain: STOP Act (Not applicable) Fill 1 day early if closed on refill date. Avoid  benzodiazepines within 8 hours of opioids   buprenorphine (BUTRANS) 5 MCG/HR PTWK    Sig: Place 1 patch onto the skin once a week for 28 days.    Dispense:  4 patch    Refill:  0    Chronic Pain: STOP Act (Not applicable) Fill 1 day early if closed on refill date. Avoid benzodiazepines within 8 hours of opioids   buprenorphine (BUTRANS) 5 MCG/HR PTWK    Sig: Place 1 patch onto the skin once a week for 28 days.    Dispense:  4 patch    Refill:  0    Chronic Pain: STOP Act (Not applicable) Fill 1 day early if closed on refill date. Avoid benzodiazepines within 8 hours of opioids   naloxone (  NARCAN) nasal spray 4 mg/0.1 mL    Sig: Place 1 spray into the nose as needed for up to 365 doses (for opioid-induced respiratory depresssion). In case of emergency (overdose), spray once into each nostril. If no response within 3 minutes, repeat application and call 911.    Dispense:  1 each    Refill:  1    Instruct patient in proper use of device.   Orders:  Orders Placed This Encounter  Procedures   ToxASSURE Select 13 (MW), Urine    Volume: 30 ml(s). Minimum 3 ml of urine is needed. Document temperature of fresh sample. Indications: Long term (current) use of opiate analgesic (Z61.096)    Release to patient:   Immediate   Nursing Instructions:    1). STAT: UDS required today. 2). Make sure to document all opioids and benzodiazepines taken, including time of last intake. 3). If order is entered on a procedure day, make sure sample is obtained before any medications are administered.   Follow-up plan:   Return in about 12 weeks (around 01/08/2024) for Eval-day (M,W), (F2F), (MM).      Interventional Therapies  Risk Factors  Considerations:   PLAVIX Anticoagulation: (Stop: 7 days  Restart: 2 hours)   Planned  Pending:      Under consideration:   Therapeutic bilateral Lumbar facet + bilateral sacroiliac joint RFA #1    Completed:   Palliative left IA hip inj. x1 (05/13/2018)  (100/100/75/>75)  Palliative bilateral lumbar facet MBB x2 (02/21/2016)  Palliative bilateral SI Blk x1 (02/21/2016)    Therapeutic  Palliative (PRN) options:   Palliative left intra-articular hip joint injection #2  Palliative bilateral lumbar facet block #3  Palliative bilateral sacroiliac joint block #2    Pharmacotherapy  Nonopioids transfer 07/25/2020: Gabapentin      Recent Visits Date Type Provider Dept  09/16/23 Office Visit Delano Metz, MD [facility]  Showing recent visits within past 90 days and meeting all other requirements Today's Visits Date Type Provider Dept  10/16/23 Office Visit Delano Metz, MD [facility]  Showing today's visits and meeting all other requirements Future Appointments Date Type Provider Dept  12/30/23 Appointment Delano Metz, MD [facility]  Showing future appointments within next 90 days and meeting all other requirements  I discussed the assessment and treatment plan with the patient. The patient was provided an opportunity to ask questions and all were answered. The patient agreed with the plan and demonstrated an understanding of the instructions.  Patient advised to call back or seek an in-person evaluation if the symptoms or condition worsens.  Duration of encounter: 30 minutes.  Total time on encounter, as per AMA guidelines included both the face-to-face and non-face-to-face time personally spent by the physician and/or other qualified health care professional(s) on the day of the encounter (includes time in activities that require the physician or other qualified health care professional and does not include time in activities normally performed by clinical staff). Physician's time may include the following activities when performed: Preparing to see the patient (e.g., pre-charting review of records, searching for previously ordered imaging, lab work, and nerve conduction tests) Review of  prior analgesic pharmacotherapies. Reviewing PMP Interpreting ordered tests (e.g., lab work, imaging, nerve conduction tests) Performing post-procedure evaluations, including interpretation of diagnostic procedures Obtaining and/or reviewing separately obtained history Performing a medically appropriate examination and/or evaluation Counseling and educating the patient/family/caregiver Ordering medications, tests, or procedures Referring and communicating with other health care professionals (when not  separately reported) Documenting clinical information in the electronic or other health record Independently interpreting results (not separately reported) and communicating results to the patient/ family/caregiver Care coordination (not separately reported)  Note by: Oswaldo Done, MD Date: 10/16/2023; Time: 3:45 PM

## 2023-10-16 ENCOUNTER — Encounter: Payer: Self-pay | Admitting: Pain Medicine

## 2023-10-16 ENCOUNTER — Ambulatory Visit: Payer: Medicare Other | Attending: Pain Medicine | Admitting: Pain Medicine

## 2023-10-16 VITALS — BP 146/78 | HR 87 | Temp 97.5°F | Resp 18 | Ht 63.75 in | Wt 165.0 lb

## 2023-10-16 DIAGNOSIS — M47816 Spondylosis without myelopathy or radiculopathy, lumbar region: Secondary | ICD-10-CM | POA: Insufficient documentation

## 2023-10-16 DIAGNOSIS — G8929 Other chronic pain: Secondary | ICD-10-CM | POA: Diagnosis present

## 2023-10-16 DIAGNOSIS — G894 Chronic pain syndrome: Secondary | ICD-10-CM | POA: Diagnosis present

## 2023-10-16 DIAGNOSIS — M79604 Pain in right leg: Secondary | ICD-10-CM | POA: Diagnosis present

## 2023-10-16 DIAGNOSIS — Z79891 Long term (current) use of opiate analgesic: Secondary | ICD-10-CM | POA: Diagnosis present

## 2023-10-16 DIAGNOSIS — M545 Low back pain, unspecified: Secondary | ICD-10-CM | POA: Diagnosis present

## 2023-10-16 DIAGNOSIS — Z79899 Other long term (current) drug therapy: Secondary | ICD-10-CM | POA: Diagnosis present

## 2023-10-16 DIAGNOSIS — R0789 Other chest pain: Secondary | ICD-10-CM | POA: Diagnosis present

## 2023-10-16 DIAGNOSIS — M25511 Pain in right shoulder: Secondary | ICD-10-CM | POA: Diagnosis present

## 2023-10-16 DIAGNOSIS — M79605 Pain in left leg: Secondary | ICD-10-CM | POA: Diagnosis present

## 2023-10-16 MED ORDER — BUPRENORPHINE 5 MCG/HR TD PTWK: 1.0000 | MEDICATED_PATCH | TRANSDERMAL | 0 refills | Status: DC

## 2023-10-16 MED ORDER — NALOXONE HCL 4 MG/0.1ML NA LIQD: 1.0000 | NASAL | 1 refills | Status: AC | PRN

## 2023-10-16 NOTE — Patient Instructions (Signed)
______________________________________________________________________    Opioid Pain Medication Update  To: All patients taking opioid pain medications. (I.e.: hydrocodone, hydromorphone, oxycodone, oxymorphone, morphine, codeine, methadone, tapentadol, tramadol, buprenorphine, fentanyl, etc.)  Re: Updated review of side effects and adverse reactions of opioid analgesics, as well as new information about long term effects of this class of medications.  Direct risks of long-term opioid therapy are not limited to opioid addiction and overdose. Potential medical risks include serious fractures, breathing problems during sleep, hyperalgesia, immunosuppression, chronic constipation, bowel obstruction, myocardial infarction, and tooth decay secondary to xerostomia.  Unpredictable adverse effects that can occur even if you take your medication correctly: Cognitive impairment, respiratory depression, and death. Most people think that if they take their medication "correctly", and "as instructed", that they will be safe. Nothing could be farther from the truth. In reality, a significant amount of recorded deaths associated with the use of opioids has occurred in individuals that had taken the medication for a long time, and were taking their medication correctly. The following are examples of how this can happen: Patient taking his/her medication for a long time, as instructed, without any side effects, is given a certain antibiotic or another unrelated medication, which in turn triggers a "Drug-to-drug interaction" leading to disorientation, cognitive impairment, impaired reflexes, respiratory depression or an untoward event leading to serious bodily harm or injury, including death.  Patient taking his/her medication for a long time, as instructed, without any side effects, develops an acute impairment of liver and/or kidney function. This will lead to a rapid inability of the body to breakdown and eliminate  their pain medication, which will result in effects similar to an "overdose", but with the same medicine and dose that they had always taken. This again may lead to disorientation, cognitive impairment, impaired reflexes, respiratory depression or an untoward event leading to serious bodily harm or injury, including death.  A similar problem will occur with patients as they grow older and their liver and kidney function begins to decrease as part of the aging process.  Background information: Historically, the original case for using long-term opioid therapy to treat chronic noncancer pain was based on safety assumptions that subsequent experience has called into question. In 1996, the American Pain Society and the American Academy of Pain Medicine issued a consensus statement supporting long-term opioid therapy. This statement acknowledged the dangers of opioid prescribing but concluded that the risk for addiction was low; respiratory depression induced by opioids was short-lived, occurred mainly in opioid-naive patients, and was antagonized by pain; tolerance was not a common problem; and efforts to control diversion should not constrain opioid prescribing. This has now proven to be wrong. Experience regarding the risks for opioid addiction, misuse, and overdose in community practice has failed to support these assumptions.  According to the Centers for Disease Control and Prevention, fatal overdoses involving opioid analgesics have increased sharply over the past decade. Currently, more than 96,700 people die from drug overdoses every year. Opioids are a factor in 7 out of every 10 overdose deaths. Deaths from drug overdose have surpassed motor vehicle accidents as the leading cause of death for individuals between the ages of 61 and 64.  Clinical data suggest that neuroendocrine dysfunction may be very common in both men and women, potentially causing hypogonadism, erectile dysfunction, infertility,  decreased libido, osteoporosis, and depression. Recent studies linked higher opioid dose to increased opioid-related mortality. Controlled observational studies reported that long-term opioid therapy may be associated with increased risk for cardiovascular events. Subsequent  meta-analysis concluded that the safety of long-term opioid therapy in elderly patients has not been proven.   Side Effects and adverse reactions: Common side effects: Drowsiness (sedation). Dizziness. Nausea and vomiting. Constipation. Physical dependence -- Dependence often manifests with withdrawal symptoms when opioids are discontinued or decreased. Tolerance -- As you take repeated doses of opioids, you require increased medication to experience the same effect of pain relief. Respiratory depression -- This can occur in healthy people, especially with higher doses. However, people with COPD, asthma or other lung conditions may be even more susceptible to fatal respiratory impairment.  Uncommon side effects: An increased sensitivity to feeling pain and extreme response to pain (hyperalgesia). Chronic use of opioids can lead to this. Delayed gastric emptying (the process by which the contents of your stomach are moved into your small intestine). Muscle rigidity. Immune system and hormonal dysfunction. Quick, involuntary muscle jerks (myoclonus). Arrhythmia. Itchy skin (pruritus). Dry mouth (xerostomia).  Long-term side effects: Chronic constipation. Sleep-disordered breathing (SDB). Increased risk of bone fractures. Hypothalamic-pituitary-adrenal dysregulation. Increased risk of overdose.  RISKS: Respiratory depression and death: Opioids increase the risk of respiratory depression and death.  Drug-to-drug interactions: Opioids are relatively contraindicated in combination with benzodiazepines, sleep inducers, and other central nervous system depressants. Other classes of medications (i.e.: certain antibiotics  and even over-the-counter medications) may also trigger or induce respiratory depression in some patients.  Medical conditions: Patients with pre-existing respiratory problems are at higher risk of respiratory failure and/or depression when in combination with opioid analgesics. Opioids are relatively contraindicated in some medical conditions such as central sleep apnea.   Fractures and Falls:  Opioids increase the risk and incidence of falls. This is of particular importance in elderly patients.  Endocrine System:  Long-term administration is associated with endocrine abnormalities (endocrinopathies). (Also known as Opioid-induced Endocrinopathy) Influences on both the hypothalamic-pituitary-adrenal axis?and the hypothalamic-pituitary-gonadal axis have been demonstrated with consequent hypogonadism and adrenal insufficiency in both sexes. Hypogonadism and decreased levels of dehydroepiandrosterone sulfate have been reported in men and women. Endocrine effects include: Amenorrhoea in women (abnormal absence of menstruation) Reduced libido in both sexes Decreased sexual function Erectile dysfunction in men Hypogonadisms (decreased testicular function with shrinkage of testicles) Infertility Depression and fatigue Loss of muscle mass Anxiety Depression Immune suppression Hyperalgesia Weight gain Anemia Osteoporosis Patients (particularly women of childbearing age) should avoid opioids. There is insufficient evidence to recommend routine monitoring of asymptomatic patients taking opioids in the long-term for hormonal deficiencies.  Immune System: Human studies have demonstrated that opioids have an immunomodulating effect. These effects are mediated via opioid receptors both on immune effector cells and in the central nervous system. Opioids have been demonstrated to have adverse effects on antimicrobial response and anti-tumour surveillance. Buprenorphine has been demonstrated to have  no impact on immune function.  Opioid Induced Hyperalgesia: Human studies have demonstrated that prolonged use of opioids can lead to a state of abnormal pain sensitivity, sometimes called opioid induced hyperalgesia (OIH). Opioid induced hyperalgesia is not usually seen in the absence of tolerance to opioid analgesia. Clinically, hyperalgesia may be diagnosed if the patient on long-term opioid therapy presents with increased pain. This might be qualitatively and anatomically distinct from pain related to disease progression or to breakthrough pain resulting from development of opioid tolerance. Pain associated with hyperalgesia tends to be more diffuse than the pre-existing pain and less defined in quality. Management of opioid induced hyperalgesia requires opioid dose reduction.  Cancer: Chronic opioid therapy has been associated with an increased risk  of cancer among noncancer patients with chronic pain. This association was more evident in chronic strong opioid users. Chronic opioid consumption causes significant pathological changes in the small intestine and colon. Epidemiological studies have found that there is a link between opium dependence and initiation of gastrointestinal cancers. Cancer is the second leading cause of death after cardiovascular disease. Chronic use of opioids can cause multiple conditions such as GERD, immunosuppression and renal damage as well as carcinogenic effects, which are associated with the incidence of cancers.   Mortality: Long-term opioid use has been associated with increased mortality among patients with chronic non-cancer pain (CNCP).  Prescription of long-acting opioids for chronic noncancer pain was associated with a significantly increased risk of all-cause mortality, including deaths from causes other than overdose.  Reference: Von Korff M, Kolodny A, Deyo RA, Chou R. Long-term opioid therapy reconsidered. Ann Intern Med. 2011 Sep 6;155(5):325-8. doi:  10.7326/[id number]. PMID: 16109604; PMCID: [id number]. Randon Goldsmith, Hayward RA, Dunn KM, [place] KP. Risk of adverse events in patients prescribed long-term opioids: A cohort study in the [place] Clinical Practice Research Datalink. Eur J Pain. 2019 May;23(5):908-922. doi: 10.1002/ejp.1357. Epub 2019 Jan 31. PMID: 78295621. Colameco S, Coren JS, Ciervo CA. Continuous opioid treatment for chronic noncancer pain: a time for moderation in prescribing. Postgrad Med. 2009 Jul;121(4):61-6. doi: 10.3810/pgm.2009.07.2032. PMID: 30865784. William Hamburger RN, [place] SD, Blazina I, Cristopher Peru, Bougatsos C, Deyo RA. The effectiveness and risks of long-term opioid therapy for chronic pain: a systematic review for a [employer] of [employer] Pathways to [employer]. Ann Intern Med. 2015 Feb 17;162(4):276-86. doi: 10.7326/M14-2559. PMID: 69629528. Caryl Bis [facility], Makuc DM. NCHS Data Brief No. 22. [place]: Centers for Disease Control and Prevention; 2009. Sep, Increase in Fatal Poisonings Involving Opioid Analgesics in the [place], 1999-2006. Song IA, Choi HR, Oh TK. Long-term opioid use and mortality in patients with chronic non-cancer pain: Ten-year follow-up study in [place] from 2010 through 2019. EClinicalMedicine. 2022 Jul 18;51:101558. doi: 10.1016/j.eclinm.2022.413244. PMID: 01027253; PMCID: [id number]. Huser, W., Schubert, T., Vogelmann, T. et al. All-cause mortality in patients with long-term opioid therapy compared with non-opioid analgesics for chronic non-cancer pain: a database study. BMC Med 18, 162 (2020). http://lester.info/ Rashidian H, Karie Kirks, Malekzadeh R, Haghdoost AA. An Ecological Study of the Association between Opiate Use and Incidence of Cancers. Addict Health. 2016 Fall;8(4):252-260. PMID: 59563875; PMCID: [id number].  Our Goal: Our goal is to control your pain with means other  than the use of opioid pain medications.  Our Recommendation: Talk to your physician about coming off of these medications. We can assist you with the tapering down and stopping these medicines. Based on the new information, even if you cannot completely stop the medication, a decrease in the dose may be associated with a lesser risk. Ask for other means of controlling the pain. Decrease or eliminate those factors that significantly contribute to your pain such as smoking, obesity, and a diet heavily tilted towards "inflammatory" nutrients.  Last Updated: 03/11/2023   ______________________________________________________________________       ______________________________________________________________________    National Pain Medication Shortage  The [place] is experiencing worsening drug shortages. These have had a negative widespread effect on patient care and treatment. Not expected to improve any time soon. Predicted to last past 2029.   Drug shortage list (generic names) Oxycodone IR Oxycodone/APAP Oxymorphone IR Hydromorphone Hydrocodone/APAP Morphine  Where is the problem?  Manufacturing and supply level.  Will this shortage  affect you?  Only if you take any of the above pain medications.  How? You may be unable to fill your prescription.  Your pharmacist may offer a "partial fill" of your prescription. (Warning: Do not accept partial fills.) Prescriptions partially filled cannot be transferred to another pharmacy. Read our Medication Rules and Regulation. Depending on how much medicine you are dependent on, you may experience withdrawals when unable to get the medication.  Recommendations: Consider ending your dependence on opioid pain medications. Ask your pain specialist to assist you with the process. Consider switching to a medication currently not in shortage, such as Buprenorphine. Talk to your pain specialist about this option. Consider decreasing your pain  medication requirements by managing tolerance thru "Drug Holidays". This may help minimize withdrawals, should you run out of medicine. Control your pain thru the use of non-pharmacological interventional therapies.   Your prescriber: Prescribers cannot be blamed for shortages. Medication manufacturing and supply issues cannot be fixed by the prescriber.   NOTE: The prescriber is not responsible for supplying the medication, or solving supply issues. Work with your pharmacist to solve it. The patient is responsible for the decision to take or continue taking the medication and for identifying and securing a legal supply source. By law, supplying the medication is the job and responsibility of the pharmacy. The prescriber is responsible for the evaluation, monitoring, and prescribing of these medications.   Prescribers will NOT: Re-issue prescriptions that have been partially filled. Re-issue prescriptions already sent to a pharmacy.  Re-send prescriptions to a different pharmacy because yours did not have your medication. Ask pharmacist to order more medicine or transfer the prescription to another pharmacy. (Read below.)  New 2023 regulation: "May 04, 2022 Revised Regulation Allows DEA-Registered Pharmacies to Transfer Electronic Prescriptions at a Patient's Request DEA Headquarters Division - Public Information Office Patients now have the ability to request their electronic prescription be transferred to another pharmacy without having to go back to their practitioner to initiate the request. This revised regulation went into effect on Monday, April 30, 2022.     At a patient's request, a DEA-registered retail pharmacy can now transfer an electronic prescription for a controlled substance (schedules II-V) to another DEA-registered retail pharmacy. Prior to this change, patients would have to go through their practitioner to cancel their prescription and have it re-issued to a different  pharmacy. The process was taxing and time consuming for both patients and practitioners.    The Drug Enforcement Administration [facility]) published its intent to revise the process for transferring electronic prescriptions on July 22, 2020.  The final rule was published in the federal register on March 29, 2022 and went into effect 30 days later.  Under the final rule, a prescription can only be transferred once between pharmacies, and only if allowed under existing state or other applicable law. The prescription must remain in its electronic form; may not be altered in any way; and the transfer must be communicated directly between two licensed pharmacists. It's important to note, any authorized refills transfer with the original prescription, which means the entire prescription will be filled at the same pharmacy".  Reference: [email or web address].is [facility] website announcement)  [email or web address] [employer] of [employer])   [employer] / Vol. 88, No. 143 / Thursday, March 29, 2022 / Rules and Regulations DEPARTMENT OF JUSTICE  Drug Enforcement Administration  21 CFR Part 1306  [Docket No. [id number]]  RIN [id number] Transfer of Electronic Prescriptions for Schedules II-V Controlled Substances Between  Pharmacies for Initial Filling  ______________________________________________________________________       ______________________________________________________________________    Transfer of Pain Medication between Pharmacies  Re: 2023 DEA Clarification on existing regulation  Published on DEA Website: May 04, 2022  Title: Revised Regulation Allows DEA-Registered Pharmacies to Electrical engineer Prescriptions at a Patient's Request DEA Headquarters Division - [employer]  "Patients now have the ability to  request their electronic prescription be transferred to another pharmacy without having to go back to their practitioner to initiate the request. This revised regulation went into effect on Monday, April 30, 2022.     At a patient's request, a DEA-registered retail pharmacy can now transfer an electronic prescription for a controlled substance (schedules II-V) to another DEA-registered retail pharmacy. Prior to this change, patients would have to go through their practitioner to cancel their prescription and have it re-issued to a different pharmacy. The process was taxing and time consuming for both patients and practitioners.    The Drug Enforcement Administration [facility]) published its intent to revise the process for transferring electronic prescriptions on July 22, 2020.  The final rule was published in the federal register on March 29, 2022 and went into effect 30 days later.  Under the final rule, a prescription can only be transferred once between pharmacies, and only if allowed under existing state or other applicable law. The prescription must remain in its electronic form; may not be altered in any way; and the transfer must be communicated directly between two licensed pharmacists. It's important to note, any authorized refills transfer with the original prescription, which means the entire prescription will be filled at the same pharmacy."    REFERENCES: 1. DEA website announcement [email or web address]  2. Department of Justice website  [email or web address]  3. DEPARTMENT OF JUSTICE Drug Enforcement Administration 21 CFR Part 1306 [Docket No. [id number]] RIN [id number] "Transfer of Electronic Prescriptions for Schedules II-V Controlled Substances Between Pharmacies for Initial  Filling"  ______________________________________________________________________       ______________________________________________________________________    Medication Rules  Purpose: To inform patients, and their family members, of our medication rules and regulations.  Applies to: All patients receiving prescriptions from our practice (written or electronic).  Pharmacy of record: This is the pharmacy where your electronic prescriptions will be sent. Make sure we have the correct one.  Electronic prescriptions: In compliance with the [facility] Strengthen Opioid Misuse Prevention (STOP) Act of 2017 (Session Conni Elliot [phone]), effective September 03, 2018, all controlled substances must be electronically prescribed. Written prescriptions, faxing, or calling prescriptions to a pharmacy will no longer be done.  Prescription refills: These will be provided only during in-person appointments. No medications will be renewed without a "face-to-face" evaluation with your provider. Applies to all prescriptions.  NOTE: The following applies primarily to controlled substances (Opioid* Pain Medications).   Type of encounter (visit): For patients receiving controlled substances, face-to-face visits are required. (Not an option and not up to the patient.)  Patient's Responsibilities: Pain Pills: Bring all pain pills to every appointment (except for procedure appointments). Pill counts are required.  Pill Bottles: Bring pills in original pharmacy bottle. Bring bottle, even if empty. Always bring the bottle of the most recent fill.  Medication refills: You are responsible for knowing and keeping track of what medications you are taking and when is it that you will need a refill. The day before your appointment: write a list of all prescriptions that need to be refilled. The day of the appointment: give the list to  the admitting nurse. Prescriptions will be written only during appointments. No  prescriptions will be written on procedure days. If you forget a medication: it will not be "Called in", "Faxed", or "electronically sent". You will need to get another appointment to get these prescribed. No early refills. Do not call asking to have your prescription filled early. Partial  or short prescriptions: Occasionally your pharmacy may not have enough pills to fill your prescription.  NEVER ACCEPT a partial fill or a prescription that is short of the total amount of pills that you were prescribed.  With controlled substances the law allows 72 hours for the pharmacy to complete the prescription.  If the prescription is not completed within 72 hours, the pharmacist will require a new prescription to be written. This means that you will be short on your medicine and we WILL NOT send another prescription to complete your original prescription.  Instead, request the pharmacy to send a carrier to a nearby branch to get enough medication to provide you with your full prescription. Prescription Accuracy: You are responsible for carefully inspecting your prescriptions before leaving our office. Have the discharge nurse carefully go over each prescription with you, before taking them home. Make sure that your name is accurately spelled, that your address is correct. Check the name and dose of your medication to make sure it is accurate. Check the number of pills, and the written instructions to make sure they are clear and accurate. Make sure that you are given enough medication to last until your next medication refill appointment. Taking Medication: Take medication as prescribed. When it comes to controlled substances, taking less pills or less frequently than prescribed is permitted and encouraged. Never take more pills than instructed. Never take the medication more frequently than prescribed.  Inform other Doctors: Always inform, all of your healthcare providers, of all the medications you take. Pain  Medication from other Providers: You are not allowed to accept any additional pain medication from any other Doctor or Healthcare provider. There are two exceptions to this rule. (see below) In the event that you require additional pain medication, you are responsible for notifying us, as stated below. Cough Medicine: Often these contain an opioid, such as codeine or hydrocodone. Never accept or take cough medicine containing these opioids if you are already taking an opioid* medication. The combination may cause respiratory failure and death. Medication Agreement: You are responsible for carefully reading and following our Medication Agreement. This must be signed before receiving any prescriptions from our practice. Safely store a copy of your signed Agreement. Violations to the Agreement will result in no further prescriptions. (Additional copies of our Medication Agreement are available upon request.) Laws, Rules, & Regulations: All patients are expected to follow all [address] and [employer], [employer], Rules, [place] [employer]. Ignorance of the Laws does not constitute a valid excuse.  Illegal drugs and Controlled Substances: The use of illegal substances (including, but not limited to marijuana and its derivatives) and/or the illegal use of any controlled substances is strictly prohibited. Violation of this rule may result in the immediate and permanent discontinuation of any and all prescriptions being written by our practice. The use of any illegal substances is prohibited. Adopted CDC guidelines & recommendations: Target dosing levels will be at or below 60 MME/day. Use of benzodiazepines** is not recommended. Urine Drug testing: Patients taking controlled substances will be required to provide a urine sample upon request. Do not void before coming to your medication management appointments.  Hold emptying your bladder until you are admitted. The admitting nurse will inform you if a sample is required. Our  practice reserves the right to call you at any time to provide a sample. Once receiving the call, you have 24 hours to comply with request. Not providing a sample upon request may result in termination of medication therapy.  Exceptions: There are only two exceptions to the rule of not receiving pain medications from other Healthcare Providers. Exception #1 (Emergencies): In the event of an emergency (i.e.: accident requiring emergency care), you are allowed to receive additional pain medication. However, you are responsible for: As soon as you are able, call our office [phone], at any time of the day or night, and leave a message stating your name, the date and nature of the emergency, and the name and dose of the medication prescribed. In the event that your call is answered by a member of our staff, make sure to document and save the date, time, and the name of the person that took your information.  Exception #2 (Planned Surgery): In the event that you are scheduled by another doctor or dentist to have any type of surgery or procedure, you are allowed (for a period no longer than 30 days), to receive additional pain medication, for the acute post-op pain. However, in this case, you are responsible for picking up a copy of our "Post-op Pain Management for Surgeons" handout, and giving it to your surgeon or dentist. This document is available at our office, and does not require an appointment to obtain it. Simply go to our office during business hours (Monday-Thursday from 8:00 AM to 4:00 PM) (Friday 8:00 AM to 12:00 Noon) or if you have a scheduled appointment with [place], prior to your surgery, and ask for it by name. In addition, you are responsible for: calling our office [phone], at any time of the day or night, and leaving a message stating your name, name of your surgeon, type of surgery, and date of procedure or surgery. Failure to comply with your responsibilities may result in termination  of therapy involving the controlled substances.  Consequences:  Non-compliance with the above rules may result in permanent discontinuation of medication prescription therapy. All patients receiving any type of controlled substance is expected to comply with the above patient responsibilities. Not doing so may result in permanent discontinuation of medication prescription therapy. Medication Agreement Violation. Following the above rules, including your responsibilities will help you in avoiding a Medication Agreement Violation ("Breaking your Pain Medication Contract").  *Opioid medications include: morphine, codeine, oxycodone, oxymorphone, hydrocodone, hydromorphone, meperidine, tramadol, tapentadol, buprenorphine, fentanyl, methadone. **Benzodiazepine medications include: diazepam (Valium), alprazolam (Xanax), clonazepam (Klonopine), lorazepam (Ativan), clorazepate (Tranxene), chlordiazepoxide (Librium), estazolam (Prosom), oxazepam (Serax), temazepam (Restoril), triazolam (Halcion) (Last updated: 06/26/2023) ______________________________________________________________________      ______________________________________________________________________    Medication Recommendations and Reminders  Applies to: All patients receiving prescriptions (written and/or electronic).  Medication Rules & Regulations: You are responsible for reading, knowing, and following our "Medication Rules" document. These exist for your safety and that of others. They are not flexible and neither are we. Dismissing or ignoring them is an act of "non-compliance" that may result in complete and irreversible termination of such medication therapy. For safety reasons, "non-compliance" will not be tolerated. As with the [place] fundamental legal principle of "ignorance of the law is no defense", we will accept no excuses for not having read and knowing the content of documents provided to you by  our practice.  Pharmacy of  record:  Definition: This is the pharmacy where your electronic prescriptions will be sent.  We do not endorse any particular pharmacy. It is up to you and your insurance to decide what pharmacy to use.  We do not restrict you in your choice of pharmacy. However, once we write for your prescriptions, we will NOT be re-sending more prescriptions to fix restricted supply problems created by your pharmacy, or your insurance.  The pharmacy listed in the electronic medical record should be the one where you want electronic prescriptions to be sent. If you choose to change pharmacy, simply notify our nursing staff. Changes will be made only during your regular appointments and not over the phone.  Recommendations: Keep all of your pain medications in a safe place, under lock and key, even if you live alone. We will NOT replace lost, stolen, or damaged medication. We do not accept "Police Reports" as proof of medications having been stolen. After you fill your prescription, take 1 week's worth of pills and put them away in a safe place. You should keep a separate, properly labeled bottle for this purpose. The remainder should be kept in the original bottle. Use this as your primary supply, until it runs out. Once it's gone, then you know that you have 1 week's worth of medicine, and it is time to come in for a prescription refill. If you do this correctly, it is unlikely that you will ever run out of medicine. To make sure that the above recommendation works, it is very important that you make sure your medication refill appointments are scheduled at least 1 week before you run out of medicine. To do this in an effective manner, make sure that you do not leave the office without scheduling your next medication management appointment. Always ask the nursing staff to show you in your prescription , when your medication will be running out. Then arrange for the receptionist to get you a return appointment, at least  7 days before you run out of medicine. Do not wait until you have 1 or 2 pills left, to come in. This is very poor planning and does not take into consideration that we may need to cancel appointments due to bad weather, sickness, or emergencies affecting our staff. DO NOT ACCEPT A "Partial Fill": If for any reason your pharmacy does not have enough pills/tablets to completely fill or refill your prescription, do not allow for a "partial fill". The law allows the pharmacy to complete that prescription within 72 hours, without requiring a new prescription. If they do not fill the rest of your prescription within those 72 hours, you will need a separate prescription to fill the remaining amount, which we will NOT provide. If the reason for the partial fill is your insurance, you will need to talk to the pharmacist about payment alternatives for the remaining tablets, but again, DO NOT ACCEPT A PARTIAL FILL, unless you can trust your pharmacist to obtain the remainder of the pills within 72 hours.  Prescription refills and/or changes in medication(s):  Prescription refills, and/or changes in dose or medication, will be conducted only during scheduled medication management appointments. (Applies to both, written and electronic prescriptions.) No refills on procedure days. No medication will be changed or started on procedure days. No changes, adjustments, and/or refills will be conducted on a procedure day. Doing so will interfere with the diagnostic portion of the procedure. No phone refills. No medications will be "  called into the pharmacy". No Fax refills. No weekend refills. No Holliday refills. No after hours refills.  Remember:  Business hours are:  Monday to Thursday 8:00 AM to 4:00 PM Provider's Schedule: Delano Metz, MD - Appointments are:  Medication management: Monday and Wednesday 8:00 AM to 4:00 PM Procedure day: Tuesday and Thursday 7:30 AM to 4:00 PM Edward Jolly, MD -  Appointments are:  Medication management: Tuesday and Thursday 8:00 AM to 4:00 PM Procedure day: Monday and Wednesday 7:30 AM to 4:00 PM (Last update: 06/26/2022) ______________________________________________________________________

## 2023-10-16 NOTE — Progress Notes (Signed)
Nursing Pain Medication Assessment:  Safety precautions to be maintained throughout the outpatient stay will include: orient to surroundings, keep bed in low position, maintain call bell within reach at all times, provide assistance with transfer out of bed and ambulation.  Medication Inspection Compliance: Pill count conducted under aseptic conditions, in front of the patient. Neither the pills nor the bottle was removed from the patient's sight at any time. Once count was completed pills were immediately returned to the patient in their original bottle.  Medication: Buprenorphine (Suboxone) Pill/Patch Count: 0 patches  Pill/Patch Appearance: Markings consistent with prescribed medication Bottle Appearance: Standard pharmacy container. Clearly labeled. Filled Date: Patient did not bring label that was on the box. Unsure of filled date. Patient informed to bring label next time.  Last Medication intake:  Today

## 2023-10-18 LAB — TOXASSURE SELECT 13 (MW), URINE

## 2023-11-14 ENCOUNTER — Other Ambulatory Visit (INDEPENDENT_AMBULATORY_CARE_PROVIDER_SITE_OTHER): Payer: Self-pay

## 2023-11-14 ENCOUNTER — Telehealth (INDEPENDENT_AMBULATORY_CARE_PROVIDER_SITE_OTHER): Payer: Self-pay | Admitting: Vascular Surgery

## 2023-11-14 MED ORDER — CLOPIDOGREL BISULFATE 75 MG PO TABS: 75.0000 mg | ORAL_TABLET | Freq: Every day | ORAL | 11 refills | Status: DC

## 2023-11-14 NOTE — Telephone Encounter (Signed)
 Pt called stating he needs a prescription refill for clopidogrel pt states he has about 6 days left before medication runs out

## 2023-11-14 NOTE — Telephone Encounter (Signed)
 Refill sent to [facility] and I left a detailed message on the patient voicemail

## 2023-11-26 ENCOUNTER — Encounter: Payer: Self-pay | Admitting: Emergency Medicine

## 2023-11-26 ENCOUNTER — Emergency Department
Admission: EM | Admit: 2023-11-26 | Discharge: 2023-11-26 | Disposition: A | Discharge status: Home / Self Care | Attending: Emergency Medicine | Admitting: Emergency Medicine

## 2023-11-26 ENCOUNTER — Other Ambulatory Visit: Payer: Self-pay

## 2023-11-26 ENCOUNTER — Emergency Department

## 2023-11-26 DIAGNOSIS — R600 Localized edema: Secondary | ICD-10-CM | POA: Insufficient documentation

## 2023-11-26 DIAGNOSIS — I2510 Atherosclerotic heart disease of native coronary artery without angina pectoris: Secondary | ICD-10-CM | POA: Insufficient documentation

## 2023-11-26 DIAGNOSIS — E119 Type 2 diabetes mellitus without complications: Secondary | ICD-10-CM | POA: Insufficient documentation

## 2023-11-26 DIAGNOSIS — N182 Chronic kidney disease, stage 2 (mild): Secondary | ICD-10-CM | POA: Insufficient documentation

## 2023-11-26 DIAGNOSIS — J90 Pleural effusion, not elsewhere classified: Secondary | ICD-10-CM | POA: Diagnosis not present

## 2023-11-26 LAB — CBC WITH DIFFERENTIAL/PLATELET
Abs Immature Granulocytes: 0.01 10*3/uL (ref 0.00–0.07)
Basophils Absolute: 0 10*3/uL (ref 0.0–0.1)
Basophils Relative: 1 %
Eosinophils Absolute: 0.1 10*3/uL (ref 0.0–0.5)
Eosinophils Relative: 2 %
HCT: 37.4 % — ABNORMAL LOW (ref 39.0–52.0)
Hemoglobin: 12.2 g/dL — ABNORMAL LOW (ref 13.0–17.0)
Immature Granulocytes: 0 %
Lymphocytes Relative: 15 %
Lymphs Abs: 0.7 10*3/uL (ref 0.7–4.0)
MCH: 28.2 pg (ref 26.0–34.0)
MCHC: 32.6 g/dL (ref 30.0–36.0)
MCV: 86.6 fL (ref 80.0–100.0)
Monocytes Absolute: 0.5 10*3/uL (ref 0.1–1.0)
Monocytes Relative: 10 %
Neutro Abs: 3.5 10*3/uL (ref 1.7–7.7)
Neutrophils Relative %: 72 %
Platelets: 117 10*3/uL — ABNORMAL LOW (ref 150–400)
RBC: 4.32 MIL/uL (ref 4.22–5.81)
RDW: 15.2 % (ref 11.5–15.5)
WBC: 4.9 10*3/uL (ref 4.0–10.5)
nRBC: 0 % (ref 0.0–0.2)

## 2023-11-26 LAB — BASIC METABOLIC PANEL
Anion gap: 9 (ref 5–15)
BUN: 19 mg/dL (ref 8–23)
CO2: 25 mmol/L (ref 22–32)
Calcium: 8.7 mg/dL — ABNORMAL LOW (ref 8.9–10.3)
Chloride: 102 mmol/L (ref 98–111)
Creatinine, Ser: 1.21 mg/dL (ref 0.61–1.24)
GFR, Estimated: >60 mL/min (ref >60)
Glucose, Bld: 129 mg/dL — ABNORMAL HIGH (ref 70–99)
Potassium: 4 mmol/L (ref 3.5–5.1)
Sodium: 136 mmol/L (ref 135–145)

## 2023-11-26 MED ORDER — FUROSEMIDE 20 MG PO TABS: 20.0000 mg | ORAL_TABLET | Freq: Every day | ORAL | 0 refills | Status: DC

## 2023-11-26 MED ORDER — FUROSEMIDE 40 MG PO TABS
20.0000 mg | ORAL_TABLET | Freq: Once | ORAL | Status: AC
  Administered 2023-11-26: 20 mg via ORAL
  Filled 2023-11-26: qty 1

## 2023-11-26 NOTE — ED Triage Notes (Signed)
 Patient to ED via POV for bilateral leg swelling. Ongoing x1 week.

## 2023-11-26 NOTE — ED Provider Notes (Signed)
 [facility] Provider Note   Event Date/Time   First MD Initiated Contact with Patient 11/26/23 1921     (approximate) History  Leg Swelling  HPI [name] [name]. is a 68 y.o. male with a past medical history of CKD, type 2 diabetes, and CAD with cardiomyopathy who presents complaining of bilateral lower extremity edema and dyspnea on exertion worsening over the last few weeks.  Patient denies being on any diuretic medications or holding a diagnosis of heart failure.  Patient states that he does see cardiology but has never been told that he needs to take fluid pills in the past. ROS: Patient currently denies any vision changes, tinnitus, difficulty speaking, facial droop, sore throat, chest pain, abdominal pain, nausea/vomiting/diarrhea, dysuria, or weakness/numbness/paresthesias in any extremity   Physical Exam  Triage Vital Signs: ED Triage Vitals  Encounter Vitals Group     BP 11/26/23 1621 135/73     Systolic BP Percentile --      Diastolic BP Percentile --      Pulse Rate 11/26/23 1621 85     Resp 11/26/23 1621 18     Temp 11/26/23 1621 98.1 F (36.7 C)     Temp Source 11/26/23 1621 Oral     SpO2 11/26/23 1621 94 %     Weight 11/26/23 1622 164 lb (74.4 kg)     Height 11/26/23 1622 5\' 3"  (1.6 m)     Head Circumference --      Peak Flow --      Pain Score 11/26/23 1621 6     Pain Loc --      Pain Education --      Exclude from Growth Chart --    Most recent vital signs: Vitals:   11/26/23 1621  BP: 135/73  Pulse: 85  Resp: 18  Temp: 98.1 F (36.7 C)  SpO2: 94%   General: Awake, oriented x4. CV:  Good peripheral perfusion.  Resp:  Normal effort.  Abd:  No distention.  Other:  Elderly overweight Caucasian male resting comfortably in no acute distress.  2+ pitting edema to bilateral lower extremities ED Results / Procedures / Treatments  Labs (all labs ordered are listed, but only abnormal results are displayed) Labs Reviewed  CBC WITH  DIFFERENTIAL/PLATELET - Abnormal; Notable for the following components:      Result Value   Hemoglobin 12.2 (*)    HCT 37.4 (*)    Platelets 117 (*)    All other components within normal limits  BASIC METABOLIC PANEL - Abnormal; Notable for the following components:   Glucose, Bld 129 (*)    Calcium 8.7 (*)    All other components within normal limits   RADIOLOGY ED MD interpretation: 2 view chest x-ray independently interpreted and shows mild bibasilar subsegmental atelectasis with small pleural effusions -Agree with radiology assessment Official radiology report(s): DG Chest 2 View Result Date: 11/26/2023 CLINICAL DATA:  Bilateral lower extremity swelling for 1 week. EXAM: CHEST - 2 VIEW COMPARISON:  September 21, 2023. FINDINGS: Stable cardiomediastinal silhouette. Status post coronary bypass graft. Mild bibasilar subsegmental atelectasis is noted with small pleural effusions. Bony thorax is unremarkable. IMPRESSION: Mild bibasilar subsegmental atelectasis with small pleural effusions. Electronically Signed   By: Lupita Raider M.D.   On: 11/26/2023 19:24   PROCEDURES: Critical Care performed: No Procedures MEDICATIONS ORDERED IN ED: Medications  furosemide (LASIX) tablet 20 mg (has no administration in time range)   IMPRESSION / MDM / ASSESSMENT  AND PLAN / ED COURSE  I reviewed the triage vital signs and the nursing notes.                             The patient is on the cardiac monitor to evaluate for evidence of arrhythmia and/or significant heart rate changes. Patient's presentation is most consistent with acute presentation with potential threat to life or bodily function. 68 year old male with past medical history of CAD with cardiomyopathy presents for 2 weeks of worsening bilateral lower extremity edema with subsequent dyspnea on exertion not requiring oxygen at this time Endorses dyspnea, endorses LE edema Denies Non adherence to medication regimen  Workup: ECG, CBC,  BMP, Troponin, BNP, CXR Findings: EKG: No STEMI and no evidence of Brugada's sign, delta wave, epsilon wave, significantly prolonged QTc, or malignant arrhythmia. CXR: Bilateral pleural effusions Based on history, exam and findings, presentation most consistent with acute on chronic heart failure. Low suspicion for PNA, ACS, tamponade, aortic dissection. Interventions: Diuresis  Reassessment: Symptoms improved in ED with diuresis  Disposition (Stable): Discharge    FINAL CLINICAL IMPRESSION(S) / ED DIAGNOSES   Final diagnoses:  Peripheral edema  Pleural effusion, bilateral  Coronary artery disease involving native heart without angina pectoris, unspecified vessel or lesion type   Rx / DC Orders   ED Discharge Orders          Ordered    Ambulatory referral to Cardiology       Comments: If you have not heard from the Cardiology office within the next 72 hours please call [phone].   11/26/23 1947    furosemide (LASIX) 20 MG tablet  Daily        11/26/23 1947           Note:  This document was prepared using Dragon voice recognition software and may include unintentional dictation errors.   Merwyn Katos, MD 11/26/23 [phone]

## 2023-11-28 ENCOUNTER — Telehealth: Payer: Self-pay | Admitting: Family

## 2023-11-28 NOTE — Telephone Encounter (Signed)
 Lvm to sched post hosp f/u

## 2023-12-13 ENCOUNTER — Encounter: Admitting: Family

## 2023-12-29 NOTE — Progress Notes (Unsigned)
 PROVIDER NOTE: Interpretation of information contained herein should be left to medically-trained personnel. Specific patient instructions are provided elsewhere under "Patient Instructions" section of medical record. This document was created in part using AI and STT-dictation technology, any transcriptional errors that may result from this process are unintentional.  Patient: [name] [name].  Service: E/M   PCP: Aneita Baptise, PA  DOB: Jan 21, 1956  DOS: 12/30/2023  Provider: Candi Chafe, MD  MRN: [id number]  Delivery: Face-to-face  Specialty: Interventional Pain Management  Type: Established Patient  Setting: Ambulatory outpatient facility  Specialty designation: 09  Referring Prov.: Aneita Baptise, PA  Location: Outpatient office facility       HPI  Mr. Carlens Merker., a 68 y.o. year old male, is here today because of his Chronic pain syndrome [G89.4]. Mr. Lannin primary complain today is No chief complaint on file.  Pertinent problems: Mr. Vallee has Chronic shoulder pain (3ry area of Pain) (Right); Chronic hip pain (Left); Chronic chest wall pain (1ry area of Pain) (Incisional Midline) (since 04/22/2012); Chronic lower extremity pain (2ry area of Pain) (Bilateral) (R>L); Chronic sacroiliac joint pain (Bilateral) (L>R); Neuropathic pain; Neurogenic pain; Incisional pain; Lumbar facet syndrome (Bilateral) (R>L); Lumbar spondylosis; Keloid skin disorder; Costochondritis; Brachial plexus injury, right; Right sided weakness; Chronic pain syndrome; DDD (degenerative disc disease), lumbosacral; Lumbar facet arthropathy (Multilevel) (Bilateral); 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 Pain Assessment: Severity of   is reported as a  /10. Location:     / . Onset:  . Quality:  . Timing:  . Modifying factor(s):  Aaron Aas Vitals:  vitals were not taken for this visit.  BMI: Estimated body mass index is 29.05 kg/m as calculated from the following:   Height as of 11/26/23: 5\' 3"  (1.6 m).   Weight as of 11/26/23: 164 lb (74.4 kg). Last encounter: 10/16/2023. Last procedure: Visit date not found.  Reason for encounter: medication management. ***  Discussed the use of AI scribe software for clinical note transcription with the patient, who gave verbal consent to proceed.  History of Present Illness         RTCB: 04/01/2024   Pharmacotherapy Assessment  Analgesic: Buprenorphine  5 mcg/h patch q. 7 days (started on 09/23/2023). MME/day: (0.12 mg).   Monitoring: [place] PMP: PDMP reviewed during this encounter.       Pharmacotherapy: No side-effects or adverse reactions reported. Compliance: No problems identified. Effectiveness: Clinically acceptable.  No notes on file  No results found for: "CBDTHCR" No results found for: "D8THCCBX" No results found for: "D9THCCBX"  UDS:  Summary  Date Value Ref Range Status  10/16/2023 FINAL  Final    Comment:    ==================================================================== [employer] Select 13 (MW) ==================================================================== Test                             Result       Flag       Units  Drug Present and Declared for Prescription Verification   Buprenorphine                   11           EXPECTED   ng/mg creat   Norbuprenorphine  10           EXPECTED   ng/mg creat    Source of buprenorphine  is a scheduled prescription medication.    Norbuprenorphine is an expected metabolite of buprenorphine .  ==================================================================== Test                      Result    Flag   Units      Ref Range   Creatinine              232              mg/dL       >=04 ==================================================================== Declared Medications:  The flagging and interpretation on this report are based on the  following declared medications.  Unexpected results may arise from  inaccuracies in the declared medications.   **Note: The testing scope of this panel does not include small to  moderate amounts of these reported medications:   Buprenorphine  Patch (BuTrans )   **Note: The testing scope of this panel does not include the  following reported medications:   Allopurinol  (Zyloprim )  Aspirin   Atorvastatin  (Lipitor)  Cholecalciferol   Clopidogrel  (Plavix )  Folic Acid   Furosemide  (Lasix )  Gabapentin  (Neurontin )  Insulin  Horace Lye)  Irbesartan  (Avapro )  Isosorbide  (Imdur )  Metoprolol  (Toprol )  Multivitamin  Nitroglycerin  (Nitrostat )  Potassium  Ranolazine  (Ranexa )  Semaglutide  Vitamin B1 ==================================================================== For clinical consultation, please call [phone]. ====================================================================       ROS  Constitutional: Denies any fever or chills Gastrointestinal: No reported hemesis, hematochezia, vomiting, or acute GI distress Musculoskeletal: Denies any acute onset joint swelling, redness, loss of ROM, or weakness Neurological: No reported episodes of acute onset apraxia, aphasia, dysarthria, agnosia, amnesia, paralysis, loss of coordination, or loss of consciousness  Medication Review  Cholecalciferol , Potassium, Semaglutide (1 MG/DOSE), allopurinol , aspirin  EC, atorvastatin , buprenorphine , clopidogrel , folic acid , furosemide , gabapentin , insulin  degludec, irbesartan , isosorbide  mononitrate, metoprolol  succinate, metoprolol  tartrate, multivitamin with minerals, naloxone , nitroGLYCERIN , ranolazine , and thiamine   History Review  Allergy: Mr. Meddings has no known allergies. Drug: Mr. Bores  reports no history of drug  use. Alcohol:  reports current alcohol use of about 14.0 standard drinks of alcohol per week. Tobacco:  reports that he has quit smoking. His smoking use included cigarettes. He has quit using smokeless tobacco. Social: Mr. Memon  reports that he has quit smoking. His smoking use included cigarettes. He has quit using smokeless tobacco. He reports current alcohol use of about 14.0 standard drinks of alcohol per week. He reports that he does not use drugs. Medical:  has a past medical history of Aortic stenosis, Benign esophageal stricture, Cardiomyopathy, secondary (HCC), Coronary artery disease, Diabetes mellitus without complication (HCC), Diverticulitis, GI bleed, Gout, Hemorrhoids, Hyperlipidemia, Hypertension, Sleep apnea, and Tubular adenoma of colon. Surgical: Mr. Miya  has a past surgical history that includes Coronary angioplasty with stent; cardiac bypass; Cardiac surgery; Colonoscopy with propofol  (N/A, 04/29/2015); Esophagogastroduodenoscopy (egd) with propofol  (N/A, 04/29/2015); Septoplasty; Cardiac catheterization (Left, 02/03/2016); Coronary artery bypass graft; Colonoscopy with propofol  (N/A, 07/20/2020); Lower Extremity Angiography (Left, 11/20/2021); and Left Heart Cath (N/A, 04/08/2023). Family: family history includes Heart disease in his mother; Hypertension in his father.  Laboratory Chemistry Profile   Renal Lab Results  Component Value Date   BUN 19 11/26/2023   CREATININE 1.21 11/26/2023   GFRAA >60 05/07/2019   GFRNONAA >60 11/26/2023    Hepatic Lab Results  Component Value Date   AST 56 (H) 09/10/2023   ALT 53 (H) 09/10/2023  ALBUMIN 4.6 09/10/2023   ALKPHOS 84 09/10/2023   HCVAB NON REACTIVE 10/09/2020   LIPASE 39 07/16/2021    Electrolytes Lab Results  Component Value Date   NA 136 11/26/2023   K 4.0 11/26/2023   CL 102 11/26/2023   CALCIUM  8.7 (L) 11/26/2023   MG 2.2 10/09/2020   PHOS 3.7 10/09/2020    Bone Lab Results  Component Value Date   VD25OH  74.7 05/07/2019   VD125OH2TOT 30.8 10/26/2015    Inflammation (CRP: Acute Phase) (ESR: Chronic Phase) Lab Results  Component Value Date   CRP 0.9 05/07/2019   ESRSEDRATE 41 (H) 05/07/2019   LATICACIDVEN 1.9 10/09/2020         Note: Above Lab results reviewed.  Recent Imaging Review  DG Chest 2 View CLINICAL DATA:  Bilateral lower extremity swelling for 1 week.  EXAM: CHEST - 2 VIEW  COMPARISON:  September 21, 2023.  FINDINGS: Stable cardiomediastinal silhouette. Status post coronary bypass graft. Mild bibasilar subsegmental atelectasis is noted with small pleural effusions. Bony thorax is unremarkable.  IMPRESSION: Mild bibasilar subsegmental atelectasis with small pleural effusions.  Electronically Signed   By: Rosalene Colon M.D.   On: 11/26/2023 19:24 Note: Reviewed        Physical Exam  General appearance: Well nourished, well developed, and well hydrated. In no apparent acute distress Mental status: Alert, oriented x 3 (person, place, & time)       Respiratory: No evidence of acute respiratory distress Eyes: PERLA Vitals: There were no vitals taken for this visit. BMI: Estimated body mass index is 29.05 kg/m as calculated from the following:   Height as of 11/26/23: 5\' 3"  (1.6 m).   Weight as of 11/26/23: 164 lb (74.4 kg). Ideal: Patient weight not recorded  Assessment   Diagnosis Status  1. Chronic pain syndrome   2. Chronic chest wall pain (1ry area of Pain) (Incisional Midline) (since 04/22/2012)   3. Chronic lower extremity pain (2ry area of Pain) (Bilateral) (R>L)   4. Chronic shoulder pain (3ry area of Pain) (Right)   5. Lumbar facet syndrome (Bilateral) (R>L)   6. Chronic low back pain (Bilateral) (R>L) w/o sciatica   7. Pharmacologic therapy   8. Chronic use of opiate for therapeutic purpose   9. Encounter for medication management   10. Encounter for chronic pain management    Controlled Controlled Controlled   Updated Problems: No  problems updated.  Plan of Care  Problem-specific:  Assessment and Plan            Mr. Tc Alfaro. has a current medication list which includes the following long-term medication(s): allopurinol , atorvastatin , buprenorphine , furosemide , gabapentin , irbesartan , isosorbide  mononitrate, metoprolol  succinate, metoprolol  tartrate, and ranolazine .  Pharmacotherapy (Medications Ordered): No orders of the defined types were placed in this encounter.  Orders:  No orders of the defined types were placed in this encounter.  Follow-up plan:   No follow-ups on file.     Interventional Therapies  Risk Factors  Considerations:   PLAVIX  Anticoagulation: (Stop: 7 days  Restart: 2 hours)   Planned  Pending:      Under consideration:   Therapeutic bilateral Lumbar facet + bilateral sacroiliac joint RFA #1    Completed:   Palliative left IA hip inj. x1 (05/13/2018) (100/100/75/>75)  Palliative bilateral lumbar facet MBB x2 (02/21/2016)  Palliative bilateral SI Blk x1 (02/21/2016)    Therapeutic  Palliative (PRN) options:   Palliative left intra-articular hip joint injection #2  Palliative bilateral lumbar facet block #3  Palliative bilateral sacroiliac joint block #2    Pharmacotherapy  Nonopioids transfer 07/25/2020: Gabapentin       Recent Visits Date Type Provider Dept  10/16/23 Office Visit Renaldo Caroli, MD [facility]  Showing recent visits within past 90 days and meeting all other requirements Future Appointments Date Type Provider Dept  12/30/23 Appointment Renaldo Caroli, MD [facility]  Showing future appointments within next 90 days and meeting all other requirements  I discussed the assessment and treatment plan with the patient. The patient was provided an opportunity to ask questions and all were answered. The patient agreed with the plan and demonstrated an understanding of the instructions.  Patient advised to call back or seek  an in-person evaluation if the symptoms or condition worsens.  Duration of encounter: *** minutes.  Total time on encounter, as per AMA guidelines included both the face-to-face and non-face-to-face time personally spent by the physician and/or other qualified health care professional(s) on the day of the encounter (includes time in activities that require the physician or other qualified health care professional and does not include time in activities normally performed by clinical staff). Physician's time may include the following activities when performed: Preparing to see the patient (e.g., pre-charting review of records, searching for previously ordered imaging, lab work, and nerve conduction tests) Review of prior analgesic pharmacotherapies. Reviewing PMP Interpreting ordered tests (e.g., lab work, imaging, nerve conduction tests) Performing post-procedure evaluations, including interpretation of diagnostic procedures Obtaining and/or reviewing separately obtained history Performing a medically appropriate examination and/or evaluation Counseling and educating the patient/family/caregiver Ordering medications, tests, or procedures Referring and communicating with other health care professionals (when not separately reported) Documenting clinical information in the electronic or other health record Independently interpreting results (not separately reported) and communicating results to the patient/ family/caregiver Care coordination (not separately reported)  Note by: Candi Chafe, MD (TTS and AI technology used. I apologize for any typographical errors that were not detected and corrected.) Date: 12/30/2023; Time: 10:34 AM

## 2023-12-29 NOTE — Patient Instructions (Incomplete)
 ______________________________________________________________________    New Medication Management Provider - Marthe Slain, NP  Purpose: To inform patients of the addition of a new member to our group, Marthe Slain, NP.  Applies to: All patients receiving prescriptions from our practice (written or electronic).  Announcement: We are happy to announce the addition of Marthe Slain, NP to or practice ([facility]).  She will take up a support role assisting our interventional pain specialists in the management of our patients.  She will be primarily assigned to the medication management portion of our practice.  Physician assignment: Patient will continue to be assigned to their current Pain Specialist Physician however, Ms. Lydia Sams, NP will take over the Medication Management visits along with the responsibilities associated with such visits.  Medication Management: Any questions or inquiries associated with the day-to-day management of your pain medications, refills, or anything else associated with those prescriptions should be directed to Ms. Marthe Slain, NP.  Interventional Therapies: All issues associated with these therapies will continue to be managed by your Primary Pain Specialist.   Requesting appointments: When requesting that appointment, please make sure to specify whether the appointment is for Medication Management (to be scheduled with Ms. Lydia Sams, NP) or if an evaluation is required/desired with your Primary Pain Specialist.  (Last updated: 12/03/2023) ______________________________________________________________________      ______________________________________________________________________    Opioid Pain Medication Update  To: All patients taking opioid pain medications. (I.e.: hydrocodone , hydromorphone , oxycodone, oxymorphone, morphine , codeine, methadone, tapentadol, tramadol , buprenorphine , fentanyl , etc.)  Re:  Updated review of side effects and adverse reactions of opioid analgesics, as well as new information about long term effects of this class of medications.  Direct risks of long-term opioid therapy are not limited to opioid addiction and overdose. Potential medical risks include serious fractures, breathing problems during sleep, hyperalgesia, immunosuppression, chronic constipation, bowel obstruction, myocardial infarction, and tooth decay secondary to xerostomia.  Unpredictable adverse effects that can occur even if you take your medication correctly: Cognitive impairment, respiratory depression, and death. Most people think that if they take their medication "correctly", and "as instructed", that they will be safe. Nothing could be farther from the truth. In reality, a significant amount of recorded deaths associated with the use of opioids has occurred in individuals that had taken the medication for a long time, and were taking their medication correctly. The following are examples of how this can happen: Patient taking his/her medication for a long time, as instructed, without any side effects, is given a certain antibiotic or another unrelated medication, which in turn triggers a "Drug-to-drug interaction" leading to disorientation, cognitive impairment, impaired reflexes, respiratory depression or an untoward event leading to serious bodily harm or injury, including death.  Patient taking his/her medication for a long time, as instructed, without any side effects, develops an acute impairment of liver and/or kidney function. This will lead to a rapid inability of the body to breakdown and eliminate their pain medication, which will result in effects similar to an "overdose", but with the same medicine and dose that they had always taken. This again may lead to disorientation, cognitive impairment, impaired reflexes, respiratory depression or an untoward event leading to serious bodily harm or injury,  including death.  A similar problem will occur with patients as they grow older and their liver and kidney function begins to decrease as part of the aging process.  Background information: Historically, the original case for using long-term opioid therapy to treat chronic noncancer pain was  based on safety assumptions that subsequent experience has called into question. In 1996, the American Pain Society and the American Academy of Pain Medicine issued a consensus statement supporting long-term opioid therapy. This statement acknowledged the dangers of opioid prescribing but concluded that the risk for addiction was low; respiratory depression induced by opioids was short-lived, occurred mainly in opioid-naive patients, and was antagonized by pain; tolerance was not a common problem; and efforts to control diversion should not constrain opioid prescribing. This has now proven to be wrong. Experience regarding the risks for opioid addiction, misuse, and overdose in community practice has failed to support these assumptions.  According to the Centers for Disease Control and Prevention, fatal overdoses involving opioid analgesics have increased sharply over the past decade. Currently, more than 96,700 people die from drug overdoses every year. Opioids are a factor in 7 out of every 10 overdose deaths. Deaths from drug overdose have surpassed motor vehicle accidents as the leading cause of death for individuals between the ages of 45 and 9.  Clinical data suggest that neuroendocrine dysfunction may be very common in both men and women, potentially causing hypogonadism, erectile dysfunction, infertility, decreased libido, osteoporosis, and depression. Recent studies linked higher opioid dose to increased opioid-related mortality. Controlled observational studies reported that long-term opioid therapy may be associated with increased risk for cardiovascular events. Subsequent meta-analysis concluded that the  safety of long-term opioid therapy in elderly patients has not been proven.   Side Effects and adverse reactions: Common side effects: Drowsiness (sedation). Dizziness. Nausea and vomiting. Constipation. Physical dependence -- Dependence often manifests with withdrawal symptoms when opioids are discontinued or decreased. Tolerance -- As you take repeated doses of opioids, you require increased medication to experience the same effect of pain relief. Respiratory depression -- This can occur in healthy people, especially with higher doses. However, people with COPD, asthma or other lung conditions may be even more susceptible to fatal respiratory impairment.  Uncommon side effects: An increased sensitivity to feeling pain and extreme response to pain (hyperalgesia). Chronic use of opioids can lead to this. Delayed gastric emptying (the process by which the contents of your stomach are moved into your small intestine). Muscle rigidity. Immune system and hormonal dysfunction. Quick, involuntary muscle jerks (myoclonus). Arrhythmia. Itchy skin (pruritus). Dry mouth (xerostomia).  Long-term side effects: Chronic constipation. Sleep-disordered breathing (SDB). Increased risk of bone fractures. Hypothalamic-pituitary-adrenal dysregulation. Increased risk of overdose.  RISKS: Respiratory depression and death: Opioids increase the risk of respiratory depression and death.  Drug-to-drug interactions: Opioids are relatively contraindicated in combination with benzodiazepines, sleep inducers, and other central nervous system depressants. Other classes of medications (i.e.: certain antibiotics and even over-the-counter medications) may also trigger or induce respiratory depression in some patients.  Medical conditions: Patients with pre-existing respiratory problems are at higher risk of respiratory failure and/or depression when in combination with opioid analgesics. Opioids are relatively  contraindicated in some medical conditions such as central sleep apnea.   Fractures and Falls:  Opioids increase the risk and incidence of falls. This is of particular importance in elderly patients.  Endocrine System:  Long-term administration is associated with endocrine abnormalities (endocrinopathies). (Also known as Opioid-induced Endocrinopathy) Influences on both the hypothalamic-pituitary-adrenal axis?and the hypothalamic-pituitary-gonadal axis have been demonstrated with consequent hypogonadism and adrenal insufficiency in both sexes. Hypogonadism and decreased levels of dehydroepiandrosterone sulfate have been reported in men and women. Endocrine effects include: Amenorrhoea in women (abnormal absence of menstruation) Reduced libido in both sexes Decreased sexual function Erectile dysfunction  in men Hypogonadisms (decreased testicular function with shrinkage of testicles) Infertility Depression and fatigue Loss of muscle mass Anxiety Depression Immune suppression Hyperalgesia Weight gain Anemia Osteoporosis Patients (particularly women of childbearing age) should avoid opioids. There is insufficient evidence to recommend routine monitoring of asymptomatic patients taking opioids in the long-term for hormonal deficiencies.  Immune System: Human studies have demonstrated that opioids have an immunomodulating effect. These effects are mediated via opioid receptors both on immune effector cells and in the central nervous system. Opioids have been demonstrated to have adverse effects on antimicrobial response and anti-tumour surveillance. Buprenorphine  has been demonstrated to have no impact on immune function.  Opioid Induced Hyperalgesia: Human studies have demonstrated that prolonged use of opioids can lead to a state of abnormal pain sensitivity, sometimes called opioid induced hyperalgesia (OIH). Opioid induced hyperalgesia is not usually seen in the absence of tolerance  to opioid analgesia. Clinically, hyperalgesia may be diagnosed if the patient on long-term opioid therapy presents with increased pain. This might be qualitatively and anatomically distinct from pain related to disease progression or to breakthrough pain resulting from development of opioid tolerance. Pain associated with hyperalgesia tends to be more diffuse than the pre-existing pain and less defined in quality. Management of opioid induced hyperalgesia requires opioid dose reduction.  Cancer: Chronic opioid therapy has been associated with an increased risk of cancer among noncancer patients with chronic pain. This association was more evident in chronic strong opioid users. Chronic opioid consumption causes significant pathological changes in the small intestine and colon. Epidemiological studies have found that there is a link between opium  dependence and initiation of gastrointestinal cancers. Cancer is the second leading cause of death after cardiovascular disease. Chronic use of opioids can cause multiple conditions such as GERD, immunosuppression and renal damage as well as carcinogenic effects, which are associated with the incidence of cancers.   Mortality: Long-term opioid use has been associated with increased mortality among patients with chronic non-cancer pain (CNCP).  Prescription of long-acting opioids for chronic noncancer pain was associated with a significantly increased risk of all-cause mortality, including deaths from causes other than overdose.  Reference: Von Korff M, Kolodny A, Deyo RA, Chou R. Long-term opioid therapy reconsidered. Ann Intern Med. 2011 Sep 6;155(5):325-8. doi: 10.7326/[id number]. PMID: 53664403; PMCID: [id number]. Achilles Achilles, Hayward RA, Dunn KM, [place] KP. Risk of adverse events in patients prescribed long-term opioids: A cohort study in the [place] Clinical Practice Research Datalink. Eur J Pain. 2019 May;23(5):908-922.  doi: 10.1002/ejp.1357. Epub 2019 Jan 31. PMID: 75643329. Colameco S, Coren JS, Ciervo CA. Continuous opioid treatment for chronic noncancer pain: a time for moderation in prescribing. Postgrad Med. 2009 Jul;121(4):61-6. doi: 10.3810/pgm.2009.07.2032. PMID: 51884166. Orlan Bis RN, Easton SD, Blazina I, Sheliah Deutscher, Bougatsos C, Deyo RA. The effectiveness and risks of long-term opioid therapy for chronic pain: a systematic review for a [employer] of [employer] Pathways to [employer]. Ann Intern Med. 2015 Feb 17;162(4):276-86. doi: 10.7326/M14-2559. PMID: 06301601. [facility], Makuc DM. NCHS Data Brief No. 22. [place]: Centers for Disease Control and Prevention; 2009. Sep, Increase in Fatal Poisonings Involving Opioid Analgesics in the [place] , 1999-2006. Song IA, Choi HR, Oh TK. Long-term opioid use and mortality in patients with chronic non-cancer pain: Ten-year follow-up study in [place] from 2010 through 2019. EClinicalMedicine. 2022 Jul 18;51:101558. doi: 10.1016/j.eclinm.2022.093235. PMID: 57322025; PMCID: [id number]. Huser, WAleta Anda, T., Vogelmann, T. et al. All-cause mortality in patients with long-term opioid  therapy compared with non-opioid analgesics for chronic non-cancer pain: a database study. BMC Med 18, 162 (2020). http://lester.info/ Rashidian H, Zendehdel K, Kamangar F, Malekzadeh R, Haghdoost AA. An Ecological Study of the Association between Opiate Use and Incidence of Cancers. Addict Health. 2016 Fall;8(4):252-260. PMID: 16109604; PMCID: [id number].  Our Goal: Our goal is to control your pain with means other than the use of opioid pain medications.  Our Recommendation: Talk to your physician about coming off of these medications. We can assist you with the tapering down and stopping these medicines. Based on the new information, even if you cannot completely stop the medication, a decrease in the dose  may be associated with a lesser risk. Ask for other means of controlling the pain. Decrease or eliminate those factors that significantly contribute to your pain such as smoking, obesity, and a diet heavily tilted towards "inflammatory" nutrients.  Last Updated: 03/11/2023   ______________________________________________________________________       ______________________________________________________________________    National Pain Medication Shortage  The [place] is experiencing worsening drug shortages. These have had a negative widespread effect on patient care and treatment. Not expected to improve any time soon. Predicted to last past 2029.   Drug shortage list (generic names) Oxycodone IR Oxycodone/APAP Oxymorphone IR Hydromorphone  Hydrocodone /APAP Morphine   Where is the problem?  Manufacturing and supply level.  Will this shortage affect you?  Only if you take any of the above pain medications.  How? You may be unable to fill your prescription.  Your pharmacist may offer a "partial fill" of your prescription. (Warning: Do not accept partial fills.) Prescriptions partially filled cannot be transferred to another pharmacy. Read our Medication Rules and Regulation. Depending on how much medicine you are dependent on, you may experience withdrawals when unable to get the medication.  Recommendations: Consider ending your dependence on opioid pain medications. Ask your pain specialist to assist you with the process. Consider switching to a medication currently not in shortage, such as Buprenorphine . Talk to your pain specialist about this option. Consider decreasing your pain medication requirements by managing tolerance thru "Drug Holidays". This may help minimize withdrawals, should you run out of medicine. Control your pain thru the use of non-pharmacological interventional therapies.   Your prescriber: Prescribers cannot be blamed for shortages. Medication  manufacturing and supply issues cannot be fixed by the prescriber.   NOTE: The prescriber is not responsible for supplying the medication, or solving supply issues. Work with your pharmacist to solve it. The patient is responsible for the decision to take or continue taking the medication and for identifying and securing a legal supply source. By law, supplying the medication is the job and responsibility of the pharmacy. The prescriber is responsible for the evaluation, monitoring, and prescribing of these medications.   Prescribers will NOT: Re-issue prescriptions that have been partially filled. Re-issue prescriptions already sent to a pharmacy.  Re-send prescriptions to a different pharmacy because yours did not have your medication. Ask pharmacist to order more medicine or transfer the prescription to another pharmacy. (Read below.)  New 2023 regulation: "May 04, 2022 Revised Regulation Allows DEA-Registered Pharmacies to Transfer Electronic Prescriptions at a Patient's Request DEA Headquarters Division - Public Information Office Patients now have the ability to request their electronic prescription be transferred to another pharmacy without having to go back to their practitioner to initiate the request. This revised regulation went into effect on Monday, April 30, 2022.     At a patient's request, a DEA-registered retail pharmacy can  now transfer an electronic prescription for a controlled substance (schedules II-V) to another DEA-registered retail pharmacy. Prior to this change, patients would have to go through their practitioner to cancel their prescription and have it re-issued to a different pharmacy. The process was taxing and time consuming for both patients and practitioners.    The Drug Enforcement Administration [facility]) published its intent to revise the process for transferring electronic prescriptions on July 22, 2020.  The final rule was published in the federal  register on March 29, 2022 and went into effect 30 days later.  Under the final rule, a prescription can only be transferred once between pharmacies, and only if allowed under existing state or other applicable law. The prescription must remain in its electronic form; may not be altered in any way; and the transfer must be communicated directly between two licensed pharmacists. It's important to note, any authorized refills transfer with the original prescription, which means the entire prescription will be filled at the same pharmacy".  Reference: [email or web address].is [facility] website announcement)  [email or web address] Financial planner of Justice)   [employer] / Vol. 88, No. 143 / Thursday, March 29, 2022 / Rules and Regulations DEPARTMENT OF JUSTICE  Drug Enforcement Administration  21 CFR Part 1306  [Docket No. [id number]]  RIN [id number] Transfer of Electronic Prescriptions for Schedules II-V Controlled Substances Between Pharmacies for Initial Filling  ______________________________________________________________________       ______________________________________________________________________    Transfer of Pain Medication between Pharmacies  Re: 2023 DEA Clarification on existing regulation  Published on DEA Website: May 04, 2022  Title: Revised Regulation Allows DEA-Registered Pharmacies to Electrical engineer Prescriptions at a Patient's Request DEA Headquarters Division - [employer]  "Patients now have the ability to request their electronic prescription be transferred to another pharmacy without having to go back to their practitioner to initiate the request. This revised regulation went into effect on Monday, April 30, 2022.     At a patient's request, a DEA-registered retail pharmacy can now  transfer an electronic prescription for a controlled substance (schedules II-V) to another DEA-registered retail pharmacy. Prior to this change, patients would have to go through their practitioner to cancel their prescription and have it re-issued to a different pharmacy. The process was taxing and time consuming for both patients and practitioners.    The Drug Enforcement Administration [facility]) published its intent to revise the process for transferring electronic prescriptions on July 22, 2020.  The final rule was published in the federal register on March 29, 2022 and went into effect 30 days later.  Under the final rule, a prescription can only be transferred once between pharmacies, and only if allowed under existing state or other applicable law. The prescription must remain in its electronic form; may not be altered in any way; and the transfer must be communicated directly between two licensed pharmacists. It's important to note, any authorized refills transfer with the original prescription, which means the entire prescription will be filled at the same pharmacy."    REFERENCES: 1. DEA website announcement [email or web address]  2. Department of Justice website  CheapWipes.at.pdf  3. DEPARTMENT OF JUSTICE Drug Enforcement Administration 21 CFR Part 1306 [Docket No. [id number]] RIN [id number] "Transfer of Electronic Prescriptions for Schedules II-V Controlled Substances Between Pharmacies for Initial Filling"  ______________________________________________________________________       ______________________________________________________________________    Medication Rules  Purpose: To inform patients, and their family members, of our medication rules and regulations.  Applies to: All patients receiving  prescriptions from our practice  (written or electronic).  Pharmacy of record: This is the pharmacy where your electronic prescriptions will be sent. Make sure we have the correct one.  Electronic prescriptions: In compliance with the [place]  Strengthen Opioid Misuse Prevention (STOP) Act of 2017 (Session Law 2017-74/H243), effective September 03, 2018, all controlled substances must be electronically prescribed. Written prescriptions, faxing, or calling prescriptions to a pharmacy will no longer be done.  Prescription refills: These will be provided only during in-person appointments. No medications will be renewed without a "face-to-face" evaluation with your provider. Applies to all prescriptions.  NOTE: The following applies primarily to controlled substances (Opioid* Pain Medications).   Type of encounter (visit): For patients receiving controlled substances, face-to-face visits are required. (Not an option and not up to the patient.)  Patient's Responsibilities: Pain Pills: Bring all pain pills to every appointment (except for procedure appointments). Pill counts are required.  Pill Bottles: Bring pills in original pharmacy bottle. Bring bottle, even if empty. Always bring the bottle of the most recent fill.  Medication refills: You are responsible for knowing and keeping track of what medications you are taking and when is it that you will need a refill. The day before your appointment: write a list of all prescriptions that need to be refilled. The day of the appointment: give the list to the admitting nurse. Prescriptions will be written only during appointments. No prescriptions will be written on procedure days. If you forget a medication: it will not be "Called in", "Faxed", or "electronically sent". You will need to get another appointment to get these prescribed. No early refills. Do not call asking to have your prescription filled early. Partial  or short prescriptions: Occasionally your pharmacy may not have  enough pills to fill your prescription.  NEVER ACCEPT a partial fill or a prescription that is short of the total amount of pills that you were prescribed.  With controlled substances the law allows 72 hours for the pharmacy to complete the prescription.  If the prescription is not completed within 72 hours, the pharmacist will require a new prescription to be written. This means that you will be short on your medicine and we WILL NOT send another prescription to complete your original prescription.  Instead, request the pharmacy to send a carrier to a nearby branch to get enough medication to provide you with your full prescription. Prescription Accuracy: You are responsible for carefully inspecting your prescriptions before leaving our office. Have the discharge nurse carefully go over each prescription with you, before taking them home. Make sure that your name is accurately spelled, that your address is correct. Check the name and dose of your medication to make sure it is accurate. Check the number of pills, and the written instructions to make sure they are clear and accurate. Make sure that you are given enough medication to last until your next medication refill appointment. Taking Medication: Take medication as prescribed. When it comes to controlled substances, taking less pills or less frequently than prescribed is permitted and encouraged. Never take more pills than instructed. Never take the medication more frequently than prescribed.  Inform other Doctors: Always inform, all of your healthcare providers, of all the medications you take. Pain Medication from other Providers: You are not allowed to accept any additional pain medication from any other Doctor or Healthcare provider. There are two exceptions to this rule. (see below) In the event that you require additional pain medication, you are responsible for  notifying us , as stated below. Cough Medicine: Often these contain an opioid, such as  codeine or hydrocodone . Never accept or take cough medicine containing these opioids if you are already taking an opioid* medication. The combination may cause respiratory failure and death. Medication Agreement: You are responsible for carefully reading and following our Medication Agreement. This must be signed before receiving any prescriptions from our practice. Safely store a copy of your signed Agreement. Violations to the Agreement will result in no further prescriptions. (Additional copies of our Medication Agreement are available upon request.) Laws, Rules, & Regulations: All patients are expected to follow all [address] and [employer], Rules, [place] [employer]. Ignorance of the Laws does not constitute a valid excuse.  Illegal drugs and Controlled Substances: The use of illegal substances (including, but not limited to marijuana and its derivatives) and/or the illegal use of any controlled substances is strictly prohibited. Violation of this rule may result in the immediate and permanent discontinuation of any and all prescriptions being written by our practice. The use of any illegal substances is prohibited. Adopted CDC guidelines & recommendations: Target dosing levels will be at or below 60 MME/day. Use of benzodiazepines** is not recommended. Urine Drug testing: Patients taking controlled substances will be required to provide a urine sample upon request. Do not void before coming to your medication management appointments. Hold emptying your bladder until you are admitted. The admitting nurse will inform you if a sample is required. Our practice reserves the right to call you at any time to provide a sample. Once receiving the call, you have 24 hours to comply with request. Not providing a sample upon request may result in termination of medication therapy.  Exceptions: There are only two exceptions to the rule of not receiving pain medications from other Healthcare Providers. Exception  #1 (Emergencies): In the event of an emergency (i.e.: accident requiring emergency care), you are allowed to receive additional pain medication. However, you are responsible for: As soon as you are able, call our office [phone], at any time of the day or night, and leave a message stating your name, the date and nature of the emergency, and the name and dose of the medication prescribed. In the event that your call is answered by a member of our staff, make sure to document and save the date, time, and the name of the person that took your information.  Exception #2 (Planned Surgery): In the event that you are scheduled by another doctor or dentist to have any type of surgery or procedure, you are allowed (for a period no longer than 30 days), to receive additional pain medication, for the acute post-op pain. However, in this case, you are responsible for picking up a copy of our "Post-op Pain Management for Surgeons" handout, and giving it to your surgeon or dentist. This document is available at our office, and does not require an appointment to obtain it. Simply go to our office during business hours (Monday-Thursday from 8:00 AM to 4:00 PM) (Friday 8:00 AM to 12:00 Noon) or if you have a scheduled appointment with us , prior to your surgery, and ask for it by name. In addition, you are responsible for: calling our office [phone], at any time of the day or night, and leaving a message stating your name, name of your surgeon, type of surgery, and date of procedure or surgery. Failure to comply with your responsibilities may result in termination of therapy involving the controlled substances.  Consequences:  Non-compliance with the above rules may result in permanent discontinuation of medication prescription therapy. All patients receiving any type of controlled substance is expected to comply with the above patient responsibilities. Not doing so may result in permanent discontinuation of  medication prescription therapy. Medication Agreement Violation. Following the above rules, including your responsibilities will help you in avoiding a Medication Agreement Violation ("Breaking your Pain Medication Contract").  *Opioid medications include: morphine , codeine, oxycodone, oxymorphone, hydrocodone , hydromorphone , meperidine, tramadol , tapentadol, buprenorphine , fentanyl , methadone. **Benzodiazepine medications include: diazepam (Valium), alprazolam (Xanax), clonazepam (Klonopine), lorazepam  (Ativan ), clorazepate (Tranxene), chlordiazepoxide (Librium), estazolam (Prosom), oxazepam (Serax), temazepam (Restoril), triazolam (Halcion) (Last updated: 06/26/2023) ______________________________________________________________________      ______________________________________________________________________    Medication Recommendations and Reminders  Applies to: All patients receiving prescriptions (written and/or electronic).  Medication Rules & Regulations: You are responsible for reading, knowing, and following our "Medication Rules" document. These exist for your safety and that of others. They are not flexible and neither are we. Dismissing or ignoring them is an act of "non-compliance" that may result in complete and irreversible termination of such medication therapy. For safety reasons, "non-compliance" will not be tolerated. As with the [place] fundamental legal principle of "ignorance of the law is no defense", we will accept no excuses for not having read and knowing the content of documents provided to you by our practice.  Pharmacy of record:  Definition: This is the pharmacy where your electronic prescriptions will be sent.  We do not endorse any particular pharmacy. It is up to you and your insurance to decide what pharmacy to use.  We do not restrict you in your choice of pharmacy. However, once we write for your prescriptions, we will NOT be re-sending more prescriptions to  fix restricted supply problems created by your pharmacy, or your insurance.  The pharmacy listed in the electronic medical record should be the one where you want electronic prescriptions to be sent. If you choose to change pharmacy, simply notify our nursing staff. Changes will be made only during your regular appointments and not over the phone.  Recommendations: Keep all of your pain medications in a safe place, under lock and key, even if you live alone. We will NOT replace lost, stolen, or damaged medication. We do not accept "Police Reports" as proof of medications having been stolen. After you fill your prescription, take 1 week's worth of pills and put them away in a safe place. You should keep a separate, properly labeled bottle for this purpose. The remainder should be kept in the original bottle. Use this as your primary supply, until it runs out. Once it's gone, then you know that you have 1 week's worth of medicine, and it is time to come in for a prescription refill. If you do this correctly, it is unlikely that you will ever run out of medicine. To make sure that the above recommendation works, it is very important that you make sure your medication refill appointments are scheduled at least 1 week before you run out of medicine. To do this in an effective manner, make sure that you do not leave the office without scheduling your next medication management appointment. Always ask the nursing staff to show you in your prescription , when your medication will be running out. Then arrange for the receptionist to get you a return appointment, at least 7 days before you run out of medicine. Do not wait until you have 1 or 2 pills left, to come in.  This is very poor planning and does not take into consideration that we may need to cancel appointments due to bad weather, sickness, or emergencies affecting our staff. DO NOT ACCEPT A "Partial Fill": If for any reason your pharmacy does not have enough  pills/tablets to completely fill or refill your prescription, do not allow for a "partial fill". The law allows the pharmacy to complete that prescription within 72 hours, without requiring a new prescription. If they do not fill the rest of your prescription within those 72 hours, you will need a separate prescription to fill the remaining amount, which we will NOT provide. If the reason for the partial fill is your insurance, you will need to talk to the pharmacist about payment alternatives for the remaining tablets, but again, DO NOT ACCEPT A PARTIAL FILL, unless you can trust your pharmacist to obtain the remainder of the pills within 72 hours.  Prescription refills and/or changes in medication(s):  Prescription refills, and/or changes in dose or medication, will be conducted only during scheduled medication management appointments. (Applies to both, written and electronic prescriptions.) No refills on procedure days. No medication will be changed or started on procedure days. No changes, adjustments, and/or refills will be conducted on a procedure day. Doing so will interfere with the diagnostic portion of the procedure. No phone refills. No medications will be "called into the pharmacy". No Fax refills. No weekend refills. No Holliday refills. No after hours refills.  Remember:  Business hours are:  Monday to Thursday 8:00 AM to 4:00 PM Provider's Schedule: Renaldo Caroli, MD - Appointments are:  Medication management: Monday and Wednesday 8:00 AM to 4:00 PM Procedure day: Tuesday and Thursday 7:30 AM to 4:00 PM Cephus Collin, MD - Appointments are:  Medication management: Tuesday and Thursday 8:00 AM to 4:00 PM Procedure day: Monday and Wednesday 7:30 AM to 4:00 PM (Last update: 06/26/2022) ______________________________________________________________________

## 2023-12-30 ENCOUNTER — Ambulatory Visit: Payer: Medicare Other | Attending: Pain Medicine | Admitting: Pain Medicine

## 2023-12-30 ENCOUNTER — Encounter: Payer: Self-pay | Admitting: Pain Medicine

## 2023-12-30 VITALS — BP 144/86 | HR 84 | Temp 97.3°F | Resp 18 | Ht 63.0 in | Wt 164.0 lb

## 2023-12-30 DIAGNOSIS — M545 Low back pain, unspecified: Secondary | ICD-10-CM | POA: Insufficient documentation

## 2023-12-30 DIAGNOSIS — M79604 Pain in right leg: Secondary | ICD-10-CM | POA: Insufficient documentation

## 2023-12-30 DIAGNOSIS — Z79899 Other long term (current) drug therapy: Secondary | ICD-10-CM | POA: Insufficient documentation

## 2023-12-30 DIAGNOSIS — G894 Chronic pain syndrome: Secondary | ICD-10-CM | POA: Diagnosis not present

## 2023-12-30 DIAGNOSIS — R0789 Other chest pain: Secondary | ICD-10-CM | POA: Diagnosis not present

## 2023-12-30 DIAGNOSIS — G8929 Other chronic pain: Secondary | ICD-10-CM | POA: Insufficient documentation

## 2023-12-30 DIAGNOSIS — M25511 Pain in right shoulder: Secondary | ICD-10-CM | POA: Diagnosis present

## 2023-12-30 DIAGNOSIS — M47816 Spondylosis without myelopathy or radiculopathy, lumbar region: Secondary | ICD-10-CM | POA: Insufficient documentation

## 2023-12-30 DIAGNOSIS — M79605 Pain in left leg: Secondary | ICD-10-CM | POA: Diagnosis present

## 2023-12-30 DIAGNOSIS — Z79891 Long term (current) use of opiate analgesic: Secondary | ICD-10-CM | POA: Insufficient documentation

## 2023-12-30 MED ORDER — BUPRENORPHINE 5 MCG/HR TD PTWK: 1.0000 | MEDICATED_PATCH | TRANSDERMAL | 0 refills | Status: DC

## 2023-12-30 NOTE — Progress Notes (Signed)
 Nursing Pain Medication Assessment:  Safety precautions to be maintained throughout the outpatient stay will include: orient to surroundings, keep bed in low position, maintain call bell within reach at all times, provide assistance with transfer out of bed and ambulation.   Medication Inspection Compliance: Pill count conducted under aseptic conditions, in front of the patient. Neither the pills nor the bottle was removed from the patient's sight at any time. Once count was completed pills were immediately returned to the patient in their original bottle.  Medication: [place]  Patch  Pill/Patch Count:  2 of 4 patches remain Pill/Patch Appearance: Markings consistent with prescribed medication Bottle Appearance: Standard pharmacy container. Clearly labeled. Filled Date: 04 / 26 / 2025 Last Medication intake:  Today

## 2024-01-21 ENCOUNTER — Encounter (INDEPENDENT_AMBULATORY_CARE_PROVIDER_SITE_OTHER): Payer: Self-pay

## 2024-01-31 ENCOUNTER — Encounter: Payer: Self-pay | Admitting: *Deleted

## 2024-01-31 ENCOUNTER — Emergency Department

## 2024-01-31 ENCOUNTER — Emergency Department: Admission: EM | Admit: 2024-01-31 | Discharge: 2024-01-31 | Disposition: A | Discharge status: Home / Self Care

## 2024-01-31 ENCOUNTER — Other Ambulatory Visit: Payer: Self-pay

## 2024-01-31 DIAGNOSIS — E119 Type 2 diabetes mellitus without complications: Secondary | ICD-10-CM | POA: Insufficient documentation

## 2024-01-31 DIAGNOSIS — I110 Hypertensive heart disease with heart failure: Secondary | ICD-10-CM | POA: Diagnosis not present

## 2024-01-31 DIAGNOSIS — I2510 Atherosclerotic heart disease of native coronary artery without angina pectoris: Secondary | ICD-10-CM | POA: Diagnosis not present

## 2024-01-31 DIAGNOSIS — I509 Heart failure, unspecified: Secondary | ICD-10-CM | POA: Diagnosis not present

## 2024-01-31 DIAGNOSIS — J90 Pleural effusion, not elsewhere classified: Secondary | ICD-10-CM | POA: Diagnosis not present

## 2024-01-31 DIAGNOSIS — Z951 Presence of aortocoronary bypass graft: Secondary | ICD-10-CM | POA: Insufficient documentation

## 2024-01-31 DIAGNOSIS — R0789 Other chest pain: Secondary | ICD-10-CM | POA: Diagnosis present

## 2024-01-31 LAB — COMPREHENSIVE METABOLIC PANEL WITH GFR
ALT: 27 U/L (ref 0–44)
AST: 40 U/L (ref 15–41)
Albumin: 3.9 g/dL (ref 3.5–5.0)
Alkaline Phosphatase: 121 U/L (ref 38–126)
Anion gap: 12 (ref 5–15)
BUN: 17 mg/dL (ref 8–23)
CO2: 21 mmol/L — ABNORMAL LOW (ref 22–32)
Calcium: 8.9 mg/dL (ref 8.9–10.3)
Chloride: 103 mmol/L (ref 98–111)
Creatinine, Ser: 1.17 mg/dL (ref 0.61–1.24)
GFR, Estimated: >60 mL/min (ref >60)
Glucose, Bld: 139 mg/dL — ABNORMAL HIGH (ref 70–99)
Potassium: 4.6 mmol/L (ref 3.5–5.1)
Sodium: 136 mmol/L (ref 135–145)
Total Bilirubin: 1.2 mg/dL (ref 0.0–1.2)
Total Protein: 7.4 g/dL (ref 6.5–8.1)

## 2024-01-31 LAB — TROPONIN I (HIGH SENSITIVITY)
Troponin I (High Sensitivity): 11 ng/L (ref <18)
Troponin I (High Sensitivity): 11 ng/L (ref <18)

## 2024-01-31 LAB — LIPASE, BLOOD: Lipase: 35 U/L (ref 11–51)

## 2024-01-31 LAB — PROTIME-INR
INR: 1.2 (ref 0.8–1.2)
Prothrombin Time: 15.8 s — ABNORMAL HIGH (ref 11.4–15.2)

## 2024-01-31 MED ORDER — IOHEXOL 350 MG/ML SOLN
75.0000 mL | Freq: Once | INTRAVENOUS | Status: AC | PRN
  Administered 2024-01-31: 75 mL via INTRAVENOUS

## 2024-01-31 NOTE — ED Triage Notes (Signed)
 Pt brought in via ems from home.  Pt has intermittent chest pain since 1300.  Pt took 2 ntg withut relief and 4 baby asa at home.  Iv in place.  No n/v  hx cabg and stents .  Cbg 116 per ems.  Pt alert.

## 2024-01-31 NOTE — Discharge Instructions (Signed)
 Your evaluation in the emergency department was overall reassuring.  I am unsure as to the exact cause of your chest pain, but I do agree that you should continue with your planned heart catheterization this Wednesday.  Please do follow-up with your primary care provider and cardiologist for reevaluation, and return to the emergency department with any new or worsening symptoms.

## 2024-01-31 NOTE — ED Provider Notes (Signed)
 [facility] Provider Note    Event Date/Time   First MD Initiated Contact with Patient 01/31/24 1813     (approximate)   History   Chest Pain  Pt brought in via ems from home.  Pt has intermittent chest pain since 1300.  Pt took 2 ntg withut relief and 4 baby asa at home.  Iv in place.  No n/v  hx cabg and stents .  Cbg 116 per ems.  Pt alert.     HPI [name] [name]. is a 68 y.o. male PMH CAD with prior CABG (2013, 2017), DM 2, and hypertension, hyperlipidemia, diverticulitis, esophageal stricture, prior carotid endarterectomy, aortic valve stenosis, severe tricuspid valve insufficiency, CHF presents for evaluation of chest pain - Intermittent, sharp, left lower anterior chest.  Lasts a few seconds at a time.  Multiple times an hour.  Started at rest around 1-1:30 PM today.  Some associated shortness of breath.  Unclear if pleuritic component. -No preceding trauma, strain -No cough, fever -No pain radiation -Otherwise within his usual state of health -He is planned for a heart cath this coming Wednesday for evaluation of possible valve replacement     Physical Exam   Triage Vital Signs: ED Triage Vitals  Encounter Vitals Group     BP 01/31/24 1821 (!) 171/98     Systolic BP Percentile --      Diastolic BP Percentile --      Pulse Rate 01/31/24 1821 88     Resp 01/31/24 1821 18     Temp 01/31/24 1821 98 F (36.7 C)     Temp Source 01/31/24 1821 Oral     SpO2 01/31/24 1821 99 %     Weight 01/31/24 1822 150 lb (68 kg)     Height 01/31/24 1822 5\' 3"  (1.6 m)     Head Circumference --      Peak Flow --      Pain Score 01/31/24 1821 0     Pain Loc --      Pain Education --      Exclude from Growth Chart --     Most recent vital signs: Vitals:   01/31/24 1821 01/31/24 1930  BP: (!) 171/98 (!) 153/76  Pulse: 88 62  Resp: 18 19  Temp: 98 F (36.7 C)   SpO2: 99% 100%     General: Awake, no distress.  CV:  Good peripheral perfusion. RRR,  RP 2+ Resp:  Normal effort. CTAB Abd:  No distention. Nontender to deep palpation throughout    ED Results / Procedures / Treatments   Labs (all labs ordered are listed, but only abnormal results are displayed) Labs Reviewed  COMPREHENSIVE METABOLIC PANEL WITH GFR - Abnormal; Notable for the following components:      Result Value   CO2 21 (*)    Glucose, Bld 139 (*)    All other components within normal limits  PROTIME-INR - Abnormal; Notable for the following components:   Prothrombin Time 15.8 (*)    All other components within normal limits  LIPASE, BLOOD  CBC WITH DIFFERENTIAL/PLATELET  TROPONIN I (HIGH SENSITIVITY)  TROPONIN I (HIGH SENSITIVITY)     EKG  Ecg = sinus rhythm, rate 91, no ST elevation or depression, right bundle branch block present, left axis deviation, normal intervals.   RADIOLOGY Radiology interpreted by myself and radiology reports reviewed.    PROCEDURES:  Critical Care performed: No  Procedures   MEDICATIONS ORDERED IN ED: Medications  iohexol  (OMNIPAQUE ) 350 MG/ML injection 75 mL (75 mLs Intravenous Contrast Given 01/31/24 1940)     IMPRESSION / MDM / ASSESSMENT AND PLAN / ED COURSE  I reviewed the triage vital signs and the nursing notes.                              DDX/MDM/AP: Differential diagnosis includes, but is not limited to, ACS, consider pulmonary embolism, doubt PE, do not clinically suspect aortic dissection.  Consider MSK strain.  Per chart review, patient has a history of elevated D-dimers, not adequate screening tool in his case-will escalate to CT PE.  Reassuring exam with no pulmonary crackles, no hypoxia, only very mild tachypnea, highly doubt acute valve failure at this time.  Plan: - EKG - Labs - Chest x-ray - CT PE  Patient's presentation is most consistent with acute presentation with potential threat to life or bodily function.  The patient is on the cardiac monitor to evaluate for evidence of  arrhythmia and/or significant heart rate changes.  ED course below.  Workup unremarkable including CTA chest, serial troponins, EKG.  Offered admission the patient declined and preferred to follow-up with his cardiologist for his planned outpatient catheterization this Wednesday which I believe is very reasonable.  Strict ED return precautions in place.  No clear etiology of presentation today, atypical for cardiac.  Clinical Course as of 01/31/24 2208  Fri Jan 31, 2024  1948 Trop wnl [MM]  2028 CTA chest: IMPRESSION: 1. No evidence of arterial dilatation or embolism. 2. Stable cardiomegaly with mildly prominent superior pulmonary veins. 3. IVC and hepatic vein contrast reflux which may be due to right heart dysfunction or tricuspid regurgitation. 4. Moderate layering right pleural effusion, interval increased, with small unchanged left layering pleural effusion. Overlying atelectasis. 5. COPD.  Diffuse bronchial thickening. 6. Aortic and great vessel atherosclerosis. 7. 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 8. Cirrhosis.   [MM]  2158 Rpt trop stable [MM]  2203 Patient reevaluated, no ongoing episodes of chest discomfort.  Reassured by unremarkable workup including serial troponins and CTA chest.  No clear cardiac etiology of presentation, pain is somewhat atypical with only very brief episodes lasting a few seconds at a time.  He is already scheduled for heart catheterization this Wednesday-did offer admission for further cardiac monitoring but patient preferred to continue with his outpatient plan for catheterization in a few days which I believe is very reasonable.  Will proceed with discharge home.  ED return precautions in place.  Patient agrees with plan. [MM]    Clinical Course User Index [MM] Collis Deaner, MD     FINAL CLINICAL IMPRESSION(S) / ED DIAGNOSES   Final diagnoses:   Atypical chest pain  Pleural effusion     Rx / DC Orders   ED Discharge Orders     None        Note:  This document was prepared using Dragon voice recognition software and may include unintentional dictation errors.   Collis Deaner, MD 01/31/24 [phone]

## 2024-02-01 LAB — CBC WITH DIFFERENTIAL/PLATELET
Abs Immature Granulocytes: 0.01 10*3/uL (ref 0.00–0.07)
Basophils Absolute: 0 10*3/uL (ref 0.0–0.1)
Basophils Relative: 1 %
Eosinophils Absolute: 0.1 10*3/uL (ref 0.0–0.5)
Eosinophils Relative: 2 %
HCT: 39.1 % (ref 39.0–52.0)
Hemoglobin: 12.5 g/dL — ABNORMAL LOW (ref 13.0–17.0)
Immature Granulocytes: 0 %
Lymphocytes Relative: 15 %
Lymphs Abs: 1 10*3/uL (ref 0.7–4.0)
MCH: 28.5 pg (ref 26.0–34.0)
MCHC: 32 g/dL (ref 30.0–36.0)
MCV: 89.1 fL (ref 80.0–100.0)
Monocytes Absolute: 0.6 10*3/uL (ref 0.1–1.0)
Monocytes Relative: 9 %
Neutro Abs: 4.9 10*3/uL (ref 1.7–7.7)
Neutrophils Relative %: 73 %
Platelets: 97 10*3/uL — ABNORMAL LOW (ref 150–400)
RBC: 4.39 MIL/uL (ref 4.22–5.81)
RDW: 17.1 % — ABNORMAL HIGH (ref 11.5–15.5)
WBC: 6.6 10*3/uL (ref 4.0–10.5)
nRBC: 0 % (ref 0.0–0.2)

## 2024-03-18 NOTE — Progress Notes (Signed)
 Established Patient Visit   Chief Complaint: Chief Complaint  Patient presents with  . Follow-up    3 month    Date of Service: 03/18/2024 Date of Birth: [birth year] [facility]  History of Present Illness: [name] is a 68 y.o.male patient who returns for   1.  CABG x 3, 2013  2.  Coronary stents x 2, 2014 [name]  3.  Occluded LIMA-LAD, patent SVG to PDA and D1 02/03/2016  4.  Redo CABG, RIMA-LAD 03/30/2016  5.  Postop VT/VF, patent RIMA-LAD, patent SVG-L PDA and D1, 03/30/2016  6.  NSTEMI 04/07/2023  7.  3.0 x 18 Onyx DES prox LCx, overlapping 2.5 x 38, 3.5 x 28 DES LAD, [name] 04/10/2023 8.  Essential hypertension  9.  Hyperlipidemia  10.  Type 2 diabetes  11.  Mild to moderate aortic stenosis  12.  Obstructive sleep apnea  13.  CKD stage 3 14.  PAD, s/p common iliac artery stents 15.  Left ICA > 70% stenosis 16.  Left carotid endarterectomy 08/06/2023 17.  Acute HFpEF  NYHA class II-III   Etiology: CAD,HTN, CKD, AS   EF  >55%  04/10/2023  18.  Severe aortic stenosis  19.  Severe tricuspid regurgitation  Patient previously followed by Dr. Bosie and Dr. Hester.  He is status post CABG times 3, 04/22/2012, underwent redo CABG with RIMA-LAD 03/30/2016, had postop VT/VF, cardiac catheterization revealed patent RIMA-LAD, and patent SVG to L PDA and D1.  Patient recently admitted to [name] 02/22/2023 for chest pain, Lexiscan  Myoview  revealed a fixed inferolateral wall defect.  2D echocardiogram 02/25/2023 revealed LVEF 50-55%, with mild mitral and tricuspid regurgitation, and mild to moderate aortic stenosis with calculated aortic valve area 1.08 cm, peak velocity 2.44 m/s, peak gradient 23.8 mmHg, mean gradient 15.3 mmHg.  The patient was discharged home on isosorbide  mononitrate 60 mg daily.  The patient presented to [name] 04/07/2023 for chest pain. The patient underwent cardiac catheterization on 04/08/2023, which revealed three-vessel CAD with occluded RIMA graft to LAD, patent SVG to  left PDA, and patent SVG to OM 2 with mildly reduced left ventricular function with LVEF 45 to 50% with mild aortic stenosis with calculated valve area 1.24 cm and mean gradient 24.1 mmHg. The patient was transferred to [name] for second opinion about potential complex PCI. Repeat troponins there were 6200-> 5200. He underwent cardiac catheterization at [name] on 04/10/2023. The RIMA appeared atretic with distal and proximal severe disease and did not appear to back fill the LAD.  Shockwave was performed on the LAD and left main.  IVUS would not pass into the left circumflex.  The mid LAD was stented with a 2.5 x 38 Synergy DES.  Proximal left circumflex was stented with a 3.0 x 18 mm Onyx DES.  The LAD was rewired and overlapped the prior stent with 3.5 x 28 Synergy DES. 2D echocardiogram 04/10/2023 revealed normal left ventricular function with LVEF greater than 55% with moderate aortic stenosis with peak gradient 32 mmHg, mean gradient 20 mmHg, calculated valve area 0.9 cm, and mild mitral regurgitation.   Patient was seen at [name] 11/26/2023 with chief complaint of shortness of breath and peripheral edema.  Patient was discharged home on furosemide  20 mg daily with overall clinical improvement and near resolution of peripheral edema.   The patient was seen by Dr. Devore 12/24/2023, at which time the patient was started on Ozempic and spironolactone, with overall clinical improvement.  The patient was referred  to Dr. Netty at [name] and underwent right and left heart cardiac catheterization 02/05/2024 which revealed severe three-vessel CAD, patent SVG to D1, patent SVG to L PL, and severe aortic stenosis (less than 0.7 cm).  The patient is scheduled undergo TAVR 04/23/2024, followed by staged PCI.   The patient returns today for follow-up, reports I am reasonable.  He denies exertional chest pain.  He has occasional episodes of chest discomfort which comes and goes, with and without exertion. He denies palpitations  or heart racing.   He denies presyncope or syncope.  The patient is minimally active.  He does not do any structured exercise.  He has lost an additional 16 pounds since his last office visit.  2D echocardiogram 01/01/2024 revealed LVEF 45%, severe tricuspid regurgitation, moderate aortic stenosis (0.8 cm, 2.9 m/s, 30 mmHg, 20 mmHg).  The patient has essential hypertension, blood pressure well-controlled, on metoprolol  succinate and irbesartan , which are well-tolerated without apparent side effects.  Patient follows a low-sodium, no added salt diet.  The patient has hyperlipidemia, on atorvastatin , which is well-tolerated without apparent side effects, followed by his primary care provider.  The patient follows a low-cholesterol, low-fat diet.  The patient has type 2 diabetes, on insulin  and semaglutide, which are well-tolerated without apparent side effects, followed by his primary care provider.  The patient follows a low carbohydrate, ADA diet.  Past Medical and Surgical History  Past Medical History Past Medical History:  Diagnosis Date  . Benign esophageal stricture 04/29/2015  . Cardiomyopathy, secondary (CMS/HHS-HCC)   . Carotid stenosis, right 07/26/2023  . Coronary artery disease   . Diverticulosis 04/29/2015  . External hemorrhoids 04/29/2015  . Hyperlipidemia   . Hypertension   . Mild aortic stenosis 02/19/2022  . S/P CABG (coronary artery bypass graft) 03/30/2016   Redo CABGx1 (free RIMA - LAD)  . Tubular adenoma of colon 04/29/2015    Past Surgical History He has a past surgical history that includes coronary artery bypass w/venous & arterial grafts (N/A, 04/22/2012); harvest left internal mammary artery for cabg (Right, 04/22/2012); transesophageal echocardiography (N/A, 04/22/2012); application wound vac (N/A, 04/22/2012); Septoplasty; Cardiac catheterization; Coronary angioplasty; Coronary artery bypass graft; Colonoscopy (04/29/2015); egd (04/29/2015); application wound vac  (N/A, 03/31/2016); coronary artery bypass w/single artery graft (N/A, 03/30/2016); coronary artery bypass w/vein only (N/A, 03/30/2016); and thromboendarterectomy artery carotid by neck incision (Right, 08/06/2023).   Medications and Allergies  Current Medications  Current Outpatient Medications  Medication Sig Dispense Refill  . allopurinol  (ZYLOPRIM ) 100 MG tablet Take 100 mg by mouth once daily    . aspirin  81 MG EC tablet Take 81 mg by mouth once daily    . atorvastatin  (LIPITOR) 40 MG tablet Take 40 mg by mouth at bedtime    . BD ULTRA-FINE MINI PEN NEEDLE 31 gauge x 3/16 needle   5  . buprenorphine  (BUTRANS ) 5 mcg/hour PTWK Place 1 patch onto the skin every 7 (seven) days    . cholecalciferol  (VITAMIN D3) 2,000 unit tablet Take 2,000 Units by mouth once daily    . clopidogreL  (PLAVIX ) 75 mg tablet Take 75 mg by mouth once daily    . FARXIGA 10 mg tablet Take 1 tablet (10 mg total) by mouth once daily 30 tablet 11  . FUROsemide  (LASIX ) 20 MG tablet Take 1 tablet (20 mg total) by mouth once daily as needed for Edema    . gabapentin  (NEURONTIN ) 600 MG tablet Take 1 tablet (600 mg total) by mouth 2 (two) times daily.  0  . irbesartan  (AVAPRO ) 75 MG tablet Take 1 tablet (75 mg total) by mouth at bedtime 90 tablet 3  . isosorbide  mononitrate (IMDUR ) 60 MG ER tablet Take 1 tablet (60 mg total) by mouth once daily 90 tablet 3  . lidocaine  (XYLOCAINE ) 2 % jelly Apply topically once daily as needed 30 mL 0  . metoprolol  succinate (TOPROL -XL) 25 MG XL tablet Take 1 tablet (25 mg total) by mouth 2 (two) times daily 180 tablet 3  . multivitamin tablet Take 1 tablet by mouth once daily    . nitroGLYcerin  (NITROSTAT ) 0.4 MG SL tablet Place 1 tablet (0.4 mg total) under the tongue every 5 (five) minutes as needed for Chest pain May take up to 3 doses. 25 tablet 1  . semaglutide (OZEMPIC) 1 mg/dose (4 mg/3 mL) pen injector Inject 1 mg subcutaneously every Wednesday    . spironolactone (ALDACTONE) 25 MG  tablet Take 1 tablet (25 mg total) by mouth once daily 90 tablet 3   No current facility-administered medications for this visit.    Allergies: Patient has no known allergies.  Social and Family History  Social History  reports that he quit smoking about 11 years ago. His smoking use included cigarettes. He started smoking about 51 years ago. He has a 40 pack-year smoking history. He has never used smokeless tobacco. He reports current alcohol use of about 8.0 standard drinks of alcohol per week. He reports that he does not currently use drugs.  Family History Family History  Problem Relation Name Age of Onset  . Heart failure Mother    . Heart failure Father    . Diabetes type II Other family hx   . High blood pressure (Hypertension) Other family hx   . Anesthesia problems Neg Hx      Review of Systems   Review of Systems: The patient denies chest pain, shortness of breath, orthopnea, paroxysmal nocturnal dyspnea, pedal edema, palpitations, heart racing, presyncope, syncope. Review of 8 Systems is negative except as described above.  Physical Examination   Vitals:BP 116/68   Pulse 75   Ht 160 cm (5' 3)   Wt 61.2 kg (135 lb)   SpO2 97%   BMI 23.91 kg/m  Ht:160 cm (5' 3) Wt:61.2 kg (135 lb) ADJ:Anib surface area is 1.65 meters squared. Body mass index is 23.91 kg/m.  General: Alert and oriented. Well-appearing. No acute distress. HEENT: Pupils equally reactive to light and accomodation    Neck: no JVD Lungs: Normal effort of breathing; clear to auscultation bilaterally; no wheezes, rales, rhonchi Heart: Regular rate and rhythm. No murmur, rub, or gallop Abdomen: nondistended Extremities: no cyanosis, clubbing, or edema Peripheral Pulses: 2+ radial  Skin: Warm, dry, no diaphoresis   Assessment   68 y.o. male with  1. Aortic valve stenosis, nonrheumatic   2. Atherosclerosis of native coronary artery of native heart without angina pectoris   3. Primary hypertension    4. S/P coronary artery stent placement   5. Severe tricuspid valve insufficiency   6. CKD stage 3 secondary to diabetes (CMS/HHS-HCC)   7. Type 2 diabetes mellitus without complication, unspecified whether long term insulin  use (CMS/HHS-HCC)   8. Mixed hyperlipidemia      68 year old gentleman with known coronary disease, status post CABG x 3, redo CABG with RIMA to LAD, postop cardiac catheterization 03/30/2016 for VT/VF, revealed patent RIMA to LAD, and patent SVG to L PDA and D1.  The patient was recently admitted to [facility] ED for  NSTEMI.  Cardiac catheterization revealed multivessel disease, and he was transferred to [name] for potential complex PCI, at which time he did undergo PCI with 3 stents placed.  He has mild aortic stenosis, appears asymptomatic.  He denies recurrent chest pain or shortness of breath.  He has hypertension which is well-controlled today.  He has hyperlipidemia on atorvastatin .  The patient underwent right and left heart cardiac catheterization 6//2025 which revealed severe three-vessel coronary artery disease, patent SVG to LPL, D1, and severe aortic stenosis entheses less than 0.7 cm).  Patient scheduled undergo TAVR 04/23/2024 followed by staged PCI.   Plan   1.  Continue current medications 2.  Counseled patient about low-sodium diet 3.  DASH diet printed instructions given to the patient 4.  Counseled patient about low-cholesterol diet 5.  Continue atorvastatin  for hyperlipidemia management 6.  Low fat and cholesterol diet printed instructions given to the patient 7.  Diabetes diet printed instructions given to the patient 8.  Continue DAPT 9.  Follow-up [facility] advanced heart failure 10.  Return to clinic for follow-up in 3 months  No orders of the defined types were placed in this encounter.   Return in about 3 months (around 06/18/2024).   MARSA DOOMS, MD PhD [name]

## 2024-03-23 ENCOUNTER — Ambulatory Visit: Attending: Nurse Practitioner | Admitting: Nurse Practitioner

## 2024-03-23 ENCOUNTER — Encounter: Payer: Self-pay | Admitting: Nurse Practitioner

## 2024-03-23 DIAGNOSIS — M47816 Spondylosis without myelopathy or radiculopathy, lumbar region: Secondary | ICD-10-CM | POA: Diagnosis present

## 2024-03-23 DIAGNOSIS — G894 Chronic pain syndrome: Secondary | ICD-10-CM | POA: Insufficient documentation

## 2024-03-23 DIAGNOSIS — M79604 Pain in right leg: Secondary | ICD-10-CM | POA: Diagnosis not present

## 2024-03-23 DIAGNOSIS — M79605 Pain in left leg: Secondary | ICD-10-CM | POA: Insufficient documentation

## 2024-03-23 DIAGNOSIS — M25511 Pain in right shoulder: Secondary | ICD-10-CM | POA: Diagnosis not present

## 2024-03-23 DIAGNOSIS — R0789 Other chest pain: Secondary | ICD-10-CM | POA: Insufficient documentation

## 2024-03-23 DIAGNOSIS — Z79891 Long term (current) use of opiate analgesic: Secondary | ICD-10-CM | POA: Diagnosis present

## 2024-03-23 DIAGNOSIS — Z79899 Other long term (current) drug therapy: Secondary | ICD-10-CM | POA: Insufficient documentation

## 2024-03-23 DIAGNOSIS — G8929 Other chronic pain: Secondary | ICD-10-CM | POA: Diagnosis present

## 2024-03-23 DIAGNOSIS — M545 Low back pain, unspecified: Secondary | ICD-10-CM | POA: Diagnosis present

## 2024-03-23 MED ORDER — BUPRENORPHINE 7.5 MCG/HR TD PTWK: 1.0000 | MEDICATED_PATCH | TRANSDERMAL | 0 refills | Status: DC

## 2024-03-23 NOTE — Progress Notes (Signed)
 Nursing Pain Medication Assessment:  Safety precautions to be maintained throughout the outpatient stay will include: orient to surroundings, keep bed in low position, maintain call bell within reach at all times, provide assistance with transfer out of bed and ambulation.  Medication Inspection Compliance: [name] did not comply with our request to bring his pills to be counted. He was reminded that bringing the medication bottles, even when empty, is a requirement.  Medication: None brought in. Pill/Patch Count: None available to be counted. Bottle Appearance: No container available. Did not bring bottle(s) to appointment. Filled Date: N/A Last Medication intake:  Day before yesterday

## 2024-03-23 NOTE — Progress Notes (Signed)
 PROVIDER NOTE: Interpretation of information contained herein should be left to medically-trained personnel. Specific patient instructions are provided elsewhere under Patient Instructions section of medical record. This document was created in part using AI and STT-dictation technology, any transcriptional errors that may result from this process are unintentional.  Patient: [name] [name].  Service: E/M   PCP: [facility]  DOB: [birth year]  DOS: 03/23/2024  Provider: Emmy MARLA Blanch, NP  MRN: [id number]  Delivery: Face-to-face  Specialty: Interventional Pain Management  Type: Established Patient  Setting: Ambulatory outpatient facility  Specialty designation: 09  Referring Prov.: Donnie Handing, PA  Location: Outpatient office facility       History of present illness (HPI) [name], a 68 y.o. year old male, is here today because of his No primary diagnosis found.. [name] [name] primary complain today is Back Pain (Generalized pain 6/10)  Pertinent problems: [name] [name] has Chronic shoulder pain (3ry area of pain) (Right); Chronic hip pain (Left); Chronic chest wall pain (1ry area of pain) (Incisional Midline) (Since 04/22/2012); Chronic lower extremity pain (2ry area of pain) (Bilateral) (R>L); Neurogenic pain; and Chronic pain syndrome on their pertinent problem list.   Pain Assessment: Severity of Chronic pain is reported as a 6 /10. Location: Back Left, Right, Distal/Back radiates down to lower calf. Onset: More than a month ago. Quality: Aching. Timing: Constant. Modifying factor(s): Medications, rest. Vitals:  height is 5' 0.75 (1.543 m) and weight is 137 lb 9.6 oz (62.4 kg). His temporal temperature is 97.3 F (36.3 C) (abnormal). His blood pressure is 133/68 and his pulse is 76. His respiration is 18.  BMI: Estimated body mass index is 26.21 kg/m as calculated from the following:   Height as of this encounter: 5' 0.75 (1.543 m).   Weight as of this  encounter: 137 lb 9.6 oz (62.4 kg).  Last encounter: Visit date not found. Last procedure: Visit date not found.  Reason for encounter: medication management. The patient indicates doing well with current medication regimen. No side effects or adverse reaction reported to medication. The patient complains of generalized pain; however current medication regimen help to manage pain.  The patient is currently using the Butrans  5 mcg/h patch but reports it it is less effective than before.  He expressed interest in a slightly higher dose for better pain control.  We discussed and agreed to initiate Butrans  7.5 mcg/h patch, to be applied once weekly.  Pharmacotherapy Assessment   Butrans  7.5 mcg/hr, 1 patch placed onto the skin once a week for pain  Monitoring: [place] PMP: PDMP reviewed during this encounter.       Pharmacotherapy: No side-effects or adverse reactions reported. Compliance: No problems identified. Effectiveness: Clinically acceptable.  [name], [place]  03/23/2024  1:47 PM  Sign when Signing Visit Nursing Pain Medication Assessment:  Safety precautions to be maintained throughout the outpatient stay will include: orient to surroundings, keep bed in low position, maintain call bell within reach at all times, provide assistance with transfer out of bed and ambulation.  Medication Inspection Compliance: [name] [name] did not comply with our request to bring his pills to be counted. He was reminded that bringing the medication bottles, even when empty, is a requirement.  Medication: None brought in. Pill/Patch Count: None available to be counted. Bottle Appearance: No container available. Did not bring bottle(s) to appointment. Filled Date: N/A Last Medication intake:  Day before yesterday    UDS:  Summary  Date  Value Ref Range Status  10/16/2023 FINAL  Final    Comment:    ==================================================================== [employer] Select 13  (MW) ==================================================================== Test                             Result       Flag       Units  Drug Present and Declared for Prescription Verification   Buprenorphine                   11           EXPECTED   ng/mg creat   Norbuprenorphine               10           EXPECTED   ng/mg creat    Source of buprenorphine  is a scheduled prescription medication.    Norbuprenorphine is an expected metabolite of buprenorphine .  ==================================================================== Test                      Result    Flag   Units      Ref Range   Creatinine              232              mg/dL      >=79 ==================================================================== Declared Medications:  The flagging and interpretation on this report are based on the  following declared medications.  Unexpected results may arise from  inaccuracies in the declared medications.   **Note: The testing scope of this panel does not include small to  moderate amounts of these reported medications:   Buprenorphine  Patch (BuTrans )   **Note: The testing scope of this panel does not include the  following reported medications:   Allopurinol  (Zyloprim )  Aspirin   Atorvastatin  (Lipitor)  Cholecalciferol   Clopidogrel  (Plavix )  Folic Acid   Furosemide  (Lasix )  Gabapentin  (Neurontin )  Insulin  Lelon)  Irbesartan  (Avapro )  Isosorbide  (Imdur )  Metoprolol  (Toprol )  Multivitamin  Nitroglycerin  (Nitrostat )  Potassium  Ranolazine  (Ranexa )  Semaglutide  Vitamin B1 ==================================================================== For clinical consultation, please call [phone]. ====================================================================     No results found for: CBDTHCR No results found for: D8THCCBX No results found for: D9THCCBX  ROS  Constitutional: Denies any fever or chills Gastrointestinal: No reported hemesis,  hematochezia, vomiting, or acute GI distress Musculoskeletal: Generalized pain Neurological: No reported episodes of acute onset apraxia, aphasia, dysarthria, agnosia, amnesia, paralysis, loss of coordination, or loss of consciousness  Medication Review  Cholecalciferol , Potassium, Semaglutide (1 MG/DOSE), allopurinol , aspirin  EC, atorvastatin , buprenorphine , clopidogrel , dapagliflozin propanediol, folic acid , furosemide , gabapentin , insulin  degludec, irbesartan , isosorbide  mononitrate, metoprolol  succinate, metoprolol  tartrate, multivitamin with minerals, naloxone , nitroGLYCERIN , ranolazine , spironolactone, and thiamine   History Review  Allergy: [name] [name] has no known allergies. Drug: [name] [name]  reports no history of drug use. Alcohol:  reports current alcohol use of about 14.0 standard drinks of alcohol per week. Tobacco:  reports that he has quit smoking. His smoking use included cigarettes. He has quit using smokeless tobacco. Social: [name] [name]  reports that he has quit smoking. His smoking use included cigarettes. He has quit using smokeless tobacco. He reports current alcohol use of about 14.0 standard drinks of alcohol per week. He reports that he does not use drugs. Medical:  has a past medical history of Aortic stenosis, Benign esophageal stricture, Cardiomyopathy, secondary (HCC), Coronary artery disease, Diabetes mellitus without complication (HCC), Diverticulitis, GI bleed,  Gout, Hemorrhoids, Hyperlipidemia, Hypertension, Sleep apnea, and Tubular adenoma of colon. Surgical: [name] [name]  has a past surgical history that includes Coronary angioplasty with stent; cardiac bypass; Cardiac surgery; Colonoscopy with propofol  (N/A, 04/29/2015); Esophagogastroduodenoscopy (egd) with propofol  (N/A, 04/29/2015); Septoplasty; Cardiac catheterization (Left, 02/03/2016); Coronary artery bypass graft; Colonoscopy with propofol  (N/A, 07/20/2020); Lower Extremity Angiography (Left, 11/20/2021); and Left  Heart Cath (N/A, 04/08/2023). Family: family history includes Heart disease in his mother; Hypertension in his father.  Laboratory Chemistry Profile   Renal Lab Results  Component Value Date   BUN 17 01/31/2024   CREATININE 1.17 01/31/2024   GFRAA >60 05/07/2019   GFRNONAA >60 01/31/2024    Hepatic Lab Results  Component Value Date   AST 40 01/31/2024   ALT 27 01/31/2024   ALBUMIN 3.9 01/31/2024   ALKPHOS 121 01/31/2024   HCVAB NON REACTIVE 10/09/2020   LIPASE 35 01/31/2024    Electrolytes Lab Results  Component Value Date   NA 136 01/31/2024   K 4.6 01/31/2024   CL 103 01/31/2024   CALCIUM  8.9 01/31/2024   MG 2.2 10/09/2020   PHOS 3.7 10/09/2020    Bone Lab Results  Component Value Date   VD25OH 74.7 05/07/2019   VD125OH2TOT 30.8 10/26/2015    Inflammation (CRP: Acute Phase) (ESR: Chronic Phase) Lab Results  Component Value Date   CRP 0.9 05/07/2019   ESRSEDRATE 41 (H) 05/07/2019   LATICACIDVEN 1.9 10/09/2020         Note: Above Lab results reviewed.  Recent Imaging Review  CT Angio Chest PE W and/or Wo Contrast CLINICAL DATA:  Chest pain and shortness of breath with tachypnea. No relief with sublingual nitroglycerin .  EXAM: CT ANGIOGRAPHY CHEST WITH CONTRAST  TECHNIQUE: Multidetector CT imaging of the chest was performed using the standard protocol during bolus administration of intravenous contrast. Multiplanar CT image reconstructions and MIPs were obtained to evaluate the vascular anatomy.  RADIATION DOSE REDUCTION: This exam was performed according to the departmental dose-optimization program which includes automated exposure control, adjustment of the mA and/or kV according to patient size and/or use of iterative reconstruction technique.  CONTRAST:  75mL OMNIPAQUE  IOHEXOL  350 MG/ML SOLN  COMPARISON:  Portable chest today, PA Lat chest 11/26/2023, CTA chest 09/22/2023, CTA chest, abdomen and pelvis 07/16/2021.  FINDINGS: Cardiovascular:  Stable moderate panchamber cardiomegaly. No evidence of arterial dilatation or embolism.  There are mildly prominent superior pulmonary veins but no more than previously.  There are CABG changes with healed sternotomy, extensive native coronary artery calcifications.  There is no pericardial effusion. There is moderate to heavy patchy calcification in the aorta and great vessels without aneurysm, aortic stenosis or dissection.  There is at least a 75% calcific left subclavian artery origin stenosis, possibly greater but the great vessel opacification in general is suboptimal.  IVC and hepatic vein contrast reflux is seen and may be due to right heart dysfunction or tricuspid regurgitation.  Mediastinum/Nodes: Stable mildly prominent subcarinal lymph nodes to the right, largest 1.3 cm short axis.  There are scattered shotty multifocal mediastinal lymph nodes, up to 1 cm in the prevascular space.  No new or progressive adenopathy is seen. No hilar adenopathy. Axillary spaces are clear.  Negative appearance of the thyroid , thoracic trachea, main bronchi and esophagus.  Lungs/Pleura: Moderate layering right pleural effusion, interval increased with small unchanged left layering pleural effusion.  There is diffuse bronchial thickening. Mild centrilobular emphysematous change both upper lobes.  The lungs demonstrate posterior atelectasis, scattered linear scarring anteriorly  without focal consolidation or acute edema.  Upper Abdomen: Capsular nodularity in the liver is consistent with cirrhosis, seen previously. No upper abdominal ascites.  No acute upper abdominal findings. abdominal aortic atherosclerosis.  Musculoskeletal: There is thoracic spondylosis. No acute or significant osseous findings. No chest wall mass.  Review of the MIP images confirms the above findings.  IMPRESSION: 1. No evidence of arterial dilatation or embolism. 2. Stable cardiomegaly with mildly  prominent superior pulmonary veins. 3. IVC and hepatic vein contrast reflux which may be due to right heart dysfunction or tricuspid regurgitation. 4. Moderate layering right pleural effusion, interval increased, with small unchanged left layering pleural effusion. Overlying atelectasis. 5. COPD.  Diffuse bronchial thickening. 6. Aortic and great vessel atherosclerosis. 7. 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 8. Cirrhosis.  Aortic Atherosclerosis (ICD10-I70.0) and Emphysema (ICD10-J43.9).  Electronically Signed   By: Francis Quam M.D.   On: 01/31/2024 20:25 DG Chest Portable 1 View CLINICAL DATA:  Chest pain, onset 1 p.m. today.  EXAM: PORTABLE CHEST 1 VIEW  COMPARISON:  PA Lat chest 11/26/2023.  FINDINGS: There is a moderate right and small left pleural effusions. Stable cardiomegaly with CABG changes and fixation plating over the sternotomy.  There is mild central vascular prominence without overt edema.  Stable mediastinum with heavy aortic calcific plaques. Interstitial haziness in the right base could be due to compressive atelectasis or consolidation.  The remaining lungs are clear. Clustered surgical clips again noted in the high right axilla.  Degenerative change dorsal spine. Patchy calcification both axillary arteries.  IMPRESSION: 1. Moderate right and small left pleural effusions. 2. Interstitial haziness in the right base could be due to compressive atelectasis or consolidation. 3. Stable cardiomegaly with mild central vascular prominence without overt edema. 4. Aortic and axillary calcific atherosclerosis.  Electronically Signed   By: Francis Quam M.D.   On: 01/31/2024 20:06 Note: Reviewed        Physical Exam  Vitals: BP 133/68 (BP Location: Right Arm, Patient Position: Sitting)   Pulse 76   Temp (!) 97.3 F (36.3 C) (Temporal)   Resp 18   Ht  5' 0.75 (1.543 m)   Wt 137 lb 9.6 oz (62.4 kg)   BMI 26.21 kg/m  BMI: Estimated body mass index is 26.21 kg/m as calculated from the following:   Height as of this encounter: 5' 0.75 (1.543 m).   Weight as of this encounter: 137 lb 9.6 oz (62.4 kg). Ideal: Ideal body weight: 51.7 kg (114 lb 0.5 oz) Adjusted ideal body weight: 56 kg (123 lb 7.4 oz) General appearance: Well nourished, well developed, and well hydrated. In no apparent acute distress Mental status: Alert, oriented x 3 (person, place, & time)       Respiratory: No evidence of acute respiratory distress Eyes: PERLA   Assessment   Diagnosis Status  1. Chronic chest wall pain (1ry area of Pain) (Incisional Midline) (since 04/22/2012)   2. Chronic lower extremity pain (2ry area of Pain) (Bilateral) (R>L)   3. Chronic shoulder pain (3ry area of Pain) (Right)   4. Lumbar facet syndrome (Bilateral) (R>L)   5. Chronic low back pain (Bilateral) (R>L) w/o sciatica   6. Chronic pain syndrome   7. Pharmacologic therapy   8. Chronic use of opiate for therapeutic purpose   9. Encounter for medication management   10. Encounter for chronic pain management    Controlled Controlled Controlled  Updated Problems: No problems updated.  Plan of Care  Problem-specific:  Assessment and Plan Discontinue Butrans  5 mcg/h patch   Started Butrans  7.5 mcg/hr patch, 1 patch onto skin once a week.  Prescription drug monitoring (PDMP) reviewed; findings consistent with the use of prescribed medication and no evidence of narcotic misuse or abuse.  Urine drug screening (UDS) up-to-date.  No other new issues or problems reported to this visit.  Schedule follow-up in 90 days for medication management.   Mr. Rollyn Scialdone. has a current medication list which includes the following long-term medication(s): allopurinol , atorvastatin , [START ON 04/06/2024] buprenorphine , [START ON 05/04/2024] buprenorphine , [START ON 06/01/2024] buprenorphine ,  furosemide , gabapentin , irbesartan , isosorbide  mononitrate, metoprolol  succinate, spironolactone, metoprolol  tartrate, and ranolazine .  Pharmacotherapy (Medications Ordered): Meds ordered this encounter  Medications   buprenorphine  (BUTRANS ) 7.5 MCG/HR    Sig: Place 1 patch onto the skin once a week for 28 days.    Dispense:  4 patch    Refill:  0    Chronic Pain: STOP Act (Not applicable) Fill 1 day early if closed on refill date. Avoid benzodiazepines within 8 hours of opioids   buprenorphine  (BUTRANS ) 7.5 MCG/HR    Sig: Place 1 patch onto the skin once a week for 28 days.    Dispense:  4 patch    Refill:  0    Chronic Pain: STOP Act (Not applicable) Fill 1 day early if closed on refill date. Avoid benzodiazepines within 8 hours of opioids   buprenorphine  (BUTRANS ) 7.5 MCG/HR    Sig: Place 1 patch onto the skin once a week for 28 days.    Dispense:  4 patch    Refill:  0    Chronic Pain: STOP Act (Not applicable) Fill 1 day early if closed on refill date. Avoid benzodiazepines within 8 hours of opioids   Orders:  No orders of the defined types were placed in this encounter.       Return in about 3 months (around 06/23/2024) for (F2F), (MM), Emmy Blanch NP.    Recent Visits Date Type Provider Dept  12/30/23 Office Visit Tanya Glisson, MD [facility]  Showing recent visits within past 90 days and meeting all other requirements Today's Visits Date Type Provider Dept  03/23/24 Office Visit Akeelah Seppala K, NP [facility]  Showing today's visits and meeting all other requirements Future Appointments Date Type Provider Dept  06/15/24 Appointment Lucas Winograd K, NP [facility]  Showing future appointments within next 90 days and meeting all other requirements  I discussed the assessment and treatment plan with the patient. The patient was provided an opportunity to ask questions and all were answered. The patient agreed with the plan and  demonstrated an understanding of the instructions.  Patient advised to call back or seek an in-person evaluation if the symptoms or condition worsens.  Duration of encounter: 30 minutes.  Total time on encounter, as per AMA guidelines included both the face-to-face and non-face-to-face time personally spent by the physician and/or other qualified health care professional(s) on the day of the encounter (includes time in activities that require the physician or other qualified health care professional and does not include time in activities normally performed by clinical staff). Physician's time may include the following activities when performed: Preparing to see the patient (e.g., pre-charting review of records, searching for previously ordered imaging, lab work, and nerve conduction tests) Review of prior analgesic pharmacotherapies. Reviewing PMP Interpreting ordered tests (e.g., lab  work, imaging, nerve conduction tests) Performing post-procedure evaluations, including interpretation of diagnostic procedures Obtaining and/or reviewing separately obtained history Performing a medically appropriate examination and/or evaluation Counseling and educating the patient/family/caregiver Ordering medications, tests, or procedures Referring and communicating with other health care professionals (when not separately reported) Documenting clinical information in the electronic or other health record Independently interpreting results (not separately reported) and communicating results to the patient/ family/caregiver Care coordination (not separately reported)  Note by: Lesly Joslyn K Chistian Kasler, NP (TTS and AI technology used. I apologize for any typographical errors that were not detected and corrected.) Date: 03/23/2024; Time: 1:58 PM

## 2024-04-27 ENCOUNTER — Telehealth: Payer: Self-pay | Admitting: Nurse Practitioner

## 2024-04-27 NOTE — Telephone Encounter (Signed)
 Spoke with Dr Naveira.  He states that the patient may take the oxycodone if absolutely necessart although it is not ideal to take the two of them together. Instructed patient only to take the oxycodone as needed for pain related to the valve replacement surgery. Patient states understanding.

## 2024-04-27 NOTE — Telephone Encounter (Signed)
 Patient had a heart catherter valve replace and the surgeon gave him a script for IR Oxycodone.  Doesn't know if he should keep taking the patches we give him as well. Please advise patient.

## 2024-05-11 ENCOUNTER — Other Ambulatory Visit (INDEPENDENT_AMBULATORY_CARE_PROVIDER_SITE_OTHER): Payer: Self-pay | Admitting: Vascular Surgery

## 2024-05-11 DIAGNOSIS — Z9889 Other specified postprocedural states: Secondary | ICD-10-CM

## 2024-05-12 ENCOUNTER — Other Ambulatory Visit (INDEPENDENT_AMBULATORY_CARE_PROVIDER_SITE_OTHER): Payer: Medicare Other

## 2024-05-12 ENCOUNTER — Ambulatory Visit (INDEPENDENT_AMBULATORY_CARE_PROVIDER_SITE_OTHER): Payer: Medicare Other | Admitting: Vascular Surgery

## 2024-05-12 ENCOUNTER — Encounter (INDEPENDENT_AMBULATORY_CARE_PROVIDER_SITE_OTHER): Payer: Self-pay | Admitting: Vascular Surgery

## 2024-05-12 VITALS — BP 106/65 | HR 77 | Ht 60.0 in | Wt 133.2 lb

## 2024-05-12 DIAGNOSIS — I70213 Atherosclerosis of native arteries of extremities with intermittent claudication, bilateral legs: Secondary | ICD-10-CM

## 2024-05-12 DIAGNOSIS — Z794 Long term (current) use of insulin: Secondary | ICD-10-CM

## 2024-05-12 DIAGNOSIS — I6523 Occlusion and stenosis of bilateral carotid arteries: Secondary | ICD-10-CM

## 2024-05-12 DIAGNOSIS — I10 Essential (primary) hypertension: Secondary | ICD-10-CM

## 2024-05-12 DIAGNOSIS — I739 Peripheral vascular disease, unspecified: Secondary | ICD-10-CM | POA: Diagnosis not present

## 2024-05-12 DIAGNOSIS — E1165 Type 2 diabetes mellitus with hyperglycemia: Secondary | ICD-10-CM

## 2024-05-12 DIAGNOSIS — Z9889 Other specified postprocedural states: Secondary | ICD-10-CM | POA: Diagnosis not present

## 2024-05-12 NOTE — Assessment & Plan Note (Signed)
 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  If it is helpful for us  to follow his carotid surveillance studies going forward as we are checking his PAD already I am happy to do so.

## 2024-05-12 NOTE — Progress Notes (Signed)
 MRN : [id number]  [name] [name]. is a 68 y.o. (1955-09-13) male who presents with chief complaint of  Chief Complaint  Patient presents with   Follow-up  .  History of Present Illness: Patient returns today in follow up of his PAD.  Since his last visit, he has had what sounds like a combination of the left carotid endarterectomy with a TAVR procedure.  He also had a right carotid endarterectomy.  These were all performed at [facility].  He has a very hoarse voice and wonders if this will improve.  He is scheduled to see his surgeons at [facility] next week.  His voice has been hoarse since his most recent procedure about 3 weeks ago. We are some 2-1/2 years status post aortoiliac intervention for significant peripheral arterial disease.  He had bilateral common iliac artery stent placement and kissing balloon expandable stent fashion.  He has done well since then.  His legs have been minimally symptomatic.  He does not have any lifestyle limiting claudication, ischemic rest pain, or ulceration.  His ABIs today are 1.12 on the right and 1.18 on the left with fairly normal digital pressures and waveforms today.  Current Outpatient Medications  Medication Sig Dispense Refill   allopurinol  (ZYLOPRIM ) 100 MG tablet Take 100 mg by mouth daily.      aspirin  EC 81 MG tablet Take 81 mg by mouth daily. Swallow whole.     atorvastatin  (LIPITOR) 40 MG tablet Take 40 mg by mouth at bedtime.      buprenorphine  (BUTRANS ) 7.5 MCG/HR Place 1 patch onto the skin once a week for 28 days. 4 patch 0   buprenorphine  (BUTRANS ) 7.5 MCG/HR Place 1 patch onto the skin once a week for 28 days. 4 patch 0   [START ON 06/01/2024] buprenorphine  (BUTRANS ) 7.5 MCG/HR Place 1 patch onto the skin once a week for 28 days. 4 patch 0   Cholecalciferol  25 MCG (1000 UT) tablet Take 2,000 Units by mouth daily.     clopidogrel  (PLAVIX ) 75 MG tablet Take 1 tablet (75 mg total) by mouth daily. 30 tablet 11   FARXIGA 10 MG TABS tablet Take 10  mg by mouth daily.     furosemide  (LASIX ) 20 MG tablet Take 1 tablet (20 mg total) by mouth daily. (Patient not taking: Reported on 05/12/2024) 30 tablet 0   gabapentin  (NEURONTIN ) 600 MG tablet Take 1 tablet (600 mg total) by mouth 2 (two) times daily. 60 tablet 2   irbesartan  (AVAPRO ) 150 MG tablet Take 1 tablet (150 mg total) by mouth daily. (Patient taking differently: Take 150 mg by mouth daily. Take 1/2 tablet at bedtime) 30 tablet 2   isosorbide  mononitrate (IMDUR ) 60 MG 24 hr tablet Take 1 tablet (60 mg total) by mouth every morning. 30 tablet 2   metoprolol  succinate (TOPROL -XL) 25 MG 24 hr tablet Take 1 tablet by mouth 2 (two) times daily.     Multiple Vitamin (MULTIVITAMIN WITH MINERALS) TABS tablet Take 1 tablet by mouth every evening.     naloxone  (NARCAN ) nasal spray 4 mg/0.1 mL Place 1 spray into the nose as needed for up to 365 doses (for opioid-induced respiratory depresssion). In case of emergency (overdose), spray once into each nostril. If no response within 3 minutes, repeat application and call 911. 1 each 1   nitroGLYCERIN  (NITROSTAT ) 0.4 MG SL tablet Place 0.4 mg under the tongue every 5 (five) minutes x 3 doses as needed for chest pain.  Semaglutide, 1 MG/DOSE, 4 MG/3ML SOPN Inject 1 mg into the skin every Wednesday.     spironolactone (ALDACTONE) 25 MG tablet Take 25 mg by mouth daily.     TRESIBA FLEXTOUCH 100 UNIT/ML FlexTouch Pen Inject 50 Units into the skin 2 (two) times daily. 50 units in the morning and 25 units in evening (Patient not taking: Reported on 05/12/2024)     No current facility-administered medications for this visit.    Past Medical History:  Diagnosis Date   Aortic stenosis    Benign esophageal stricture    Cardiomyopathy, secondary (HCC)    Coronary artery disease    Diabetes mellitus without complication (HCC)    Diverticulitis    GI bleed    Gout    Hemorrhoids    Hyperlipidemia    Hypertension    Sleep apnea    Tubular adenoma of colon      Past Surgical History:  Procedure Laterality Date   cardiac bypass     CARDIAC CATHETERIZATION Left 02/03/2016   Procedure: Left Heart Cath and Coronary Angiography;  Surgeon: Vinie DELENA Jude, MD;  Location: [facility] INVASIVE CV LAB;  Service: Cardiovascular;  Laterality: Left;   CARDIAC SURGERY     COLONOSCOPY WITH PROPOFOL  N/A 04/29/2015   Procedure: COLONOSCOPY WITH PROPOFOL ;  Surgeon: Deward CINDERELLA Piedmont, MD;  Location: [facility] ENDOSCOPY;  Service: Gastroenterology;  Laterality: N/A;   COLONOSCOPY WITH PROPOFOL  N/A 07/20/2020   Procedure: COLONOSCOPY WITH PROPOFOL ;  Surgeon: Toledo, Ladell POUR, MD;  Location: [facility] ENDOSCOPY;  Service: Gastroenterology;  Laterality: N/A;   CORONARY ANGIOPLASTY WITH STENT PLACEMENT     x2   CORONARY ARTERY BYPASS GRAFT     2013 & 2017   ESOPHAGOGASTRODUODENOSCOPY (EGD) WITH PROPOFOL  N/A 04/29/2015   Procedure: ESOPHAGOGASTRODUODENOSCOPY (EGD) WITH PROPOFOL ;  Surgeon: Deward CINDERELLA Piedmont, MD;  Location: [facility] ENDOSCOPY;  Service: Gastroenterology;  Laterality: N/A;   LEFT HEART CATH N/A 04/08/2023   Procedure: Left Heart Cath;  Surgeon: Ammon Blunt, MD;  Location: [facility] INVASIVE CV LAB;  Service: Cardiovascular;  Laterality: N/A;   LOWER EXTREMITY ANGIOGRAPHY Left 11/20/2021   Procedure: Lower Extremity Angiography;  Surgeon: Marea Selinda RAMAN, MD;  Location: [facility] INVASIVE CV LAB;  Service: Cardiovascular;  Laterality: Left;   SEPTOPLASTY       Social History   Tobacco Use   Smoking status: Former    Types: Cigarettes   Smokeless tobacco: Former  Building services engineer status: Never Used  Substance Use Topics   Alcohol use: Yes    Alcohol/week: 14.0 standard drinks of alcohol    Types: 14 Shots of liquor per week    Comment: couple drinks daily   Drug use: No      Family History  Problem Relation Age of Onset   Heart disease Mother    Hypertension Father      No Known Allergies  REVIEW OF SYSTEMS (Negative unless checked)   Constitutional: [] Weight loss   [] Fever  [] Chills Cardiac: [] Chest pain   [] Chest pressure   [] Palpitations   [] Shortness of breath when laying flat   [] Shortness of breath at rest   [] Shortness of breath with exertion. Vascular:  [x] Pain in legs with walking   [] Pain in legs at rest   [] Pain in legs when laying flat   [] Claudication   [] Pain in feet when walking  [] Pain in feet at rest  [] Pain in feet when laying flat   [] History of DVT   [] Phlebitis   [] Swelling in  legs   [] Varicose veins   [] Non-healing ulcers Pulmonary:   [] Uses home oxygen   [] Productive cough   [] Hemoptysis   [] Wheeze  [] COPD   [] Asthma Neurologic:  [] Dizziness  [] Blackouts   [] Seizures   [] History of stroke   [] History of TIA  [] Aphasia   [] Temporary blindness   [] Dysphagia   [] Weakness or numbness in arms   [] Weakness or numbness in legs Musculoskeletal:  [x] Arthritis   [] Joint swelling   [x] Joint pain   [] Low back pain Hematologic:  [] Easy bruising  [] Easy bleeding   [] Hypercoagulable state   [] Anemic   Gastrointestinal:  [] Blood in stool   [] Vomiting blood  [] Gastroesophageal reflux/heartburn   [] Abdominal pain Genitourinary:  [] Chronic kidney disease   [] Difficult urination  [] Frequent urination  [] Burning with urination   [] Hematuria Skin:  [] Rashes   [] Ulcers   [] Wounds Psychological:  [] History of anxiety   []  History of major depression.  Physical Examination  BP 106/65   Pulse 77   Ht 5' (1.524 m)   Wt 133 lb 4 oz (60.4 kg)   BMI 26.02 kg/m  Gen:  WD/WN, NAD Head: Kelly/AT, No temporalis wasting. Ear/Nose/Throat: Hearing grossly intact, nares w/o erythema or drainage Eyes: Conjunctiva clear. Sclera non-icteric Neck: Supple.  Trachea midline Pulmonary:  Good air movement, no use of accessory muscles.  Cardiac: RRR, no JVD Vascular:  Vessel Right Left  Radial Palpable Palpable                          PT Palpable Palpable  DP Palpable Palpable   Gastrointestinal: soft, non-tender/non-distended. No guarding/reflex.   Musculoskeletal: M/S 5/5 throughout.  No deformity or atrophy. No edema. Neurologic: Sensation grossly intact in extremities.  Symmetrical.  Speech is fluent.  Psychiatric: Judgment intact, Mood & affect appropriate for pt's clinical situation. Dermatologic: No rashes or ulcers noted.  No cellulitis or open wounds.      Labs No results found for this or any previous visit (from the past 2160 hours).  Radiology No results found.  Assessment/Plan  Carotid atherosclerosis Has now had bilateral carotid endarterectomies the most recent with his TAVR procedure at [facility].  If it is helpful for us  to follow his carotid surveillance studies going forward as we are checking his PAD already I am happy to do so.  Atherosclerosis of native arteries of extremity with intermittent claudication (HCC) His ABIs today are 1.12 on the right and 1.18 on the left with fairly normal digital pressures and waveforms today.  Continue current medical regimen.  Recheck in 1 year with ABIs.  Essential hypertension blood pressure control important in reducing the progression of atherosclerotic disease. On appropriate oral medications.     DM2 (diabetes mellitus, type 2) (HCC) blood glucose control important in reducing the progression of atherosclerotic disease. Also, involved in wound healing. On appropriate medications.     Hyperlipidemia lipid control important in reducing the progression of atherosclerotic disease. Continue statin therapy  Selinda Gu, MD  05/12/2024 2:16 PM    This note was created with Dragon medical transcription system.  Any errors from dictation are purely unintentional

## 2024-05-12 NOTE — Assessment & Plan Note (Signed)
 His ABIs today are 1.12 on the right and 1.18 on the left with fairly normal digital pressures and waveforms today.  Continue current medical regimen.  Recheck in 1 year with ABIs.

## 2024-05-13 LAB — VAS US ABI WITH/WO TBI
Left ABI: 1.18
Right ABI: 1.12

## 2024-06-02 ENCOUNTER — Ambulatory Visit: Attending: Student | Admitting: Speech Pathology

## 2024-06-02 DIAGNOSIS — R490 Dysphonia: Secondary | ICD-10-CM | POA: Diagnosis present

## 2024-06-02 NOTE — Therapy (Signed)
 OUTPATIENT SPEECH LANGUAGE PATHOLOGY  VOICE EVALUATION   Patient Name: [name] [name]. MRN: [id number] DOB:12-24-1955, 68 y.o., male Today's Date: 06/02/2024  PCP: [facility] REFERRING PROVIDER: Almarie Roe, [place]   End of Session - 06/02/24 1323     Visit Number 1    Number of Visits 17    Date for Recertification  07/28/24    Authorization Type Medicare Part A/ Medicare Part B    Progress Note Due on Visit 10    SLP Start Time 1330    SLP Stop Time  1400    SLP Time Calculation (min) 30 min    Activity Tolerance Patient tolerated treatment well          Past Medical History:  Diagnosis Date   Aortic stenosis    Benign esophageal stricture    Cardiomyopathy, secondary (HCC)    Coronary artery disease    Diabetes mellitus without complication (HCC)    Diverticulitis    GI bleed    Gout    Hemorrhoids    Hyperlipidemia    Hypertension    Sleep apnea    Tubular adenoma of colon    Past Surgical History:  Procedure Laterality Date   cardiac bypass     CARDIAC CATHETERIZATION Left 02/03/2016   Procedure: Left Heart Cath and Coronary Angiography;  Surgeon: Vinie DELENA Jude, MD;  Location: [facility] INVASIVE CV LAB;  Service: Cardiovascular;  Laterality: Left;   CARDIAC SURGERY     COLONOSCOPY WITH PROPOFOL  N/A 04/29/2015   Procedure: COLONOSCOPY WITH PROPOFOL ;  Surgeon: Deward CINDERELLA Piedmont, MD;  Location: [facility] ENDOSCOPY;  Service: Gastroenterology;  Laterality: N/A;   COLONOSCOPY WITH PROPOFOL  N/A 07/20/2020   Procedure: COLONOSCOPY WITH PROPOFOL ;  Surgeon: Toledo, Ladell POUR, MD;  Location: [facility] ENDOSCOPY;  Service: Gastroenterology;  Laterality: N/A;   CORONARY ANGIOPLASTY WITH STENT PLACEMENT     x2   CORONARY ARTERY BYPASS GRAFT     2013 & 2017   ESOPHAGOGASTRODUODENOSCOPY (EGD) WITH PROPOFOL  N/A 04/29/2015   Procedure: ESOPHAGOGASTRODUODENOSCOPY (EGD) WITH PROPOFOL ;  Surgeon: Deward CINDERELLA Piedmont, MD;  Location: [facility] ENDOSCOPY;  Service: Gastroenterology;   Laterality: N/A;   LEFT HEART CATH N/A 04/08/2023   Procedure: Left Heart Cath;  Surgeon: Ammon Blunt, MD;  Location: [facility] INVASIVE CV LAB;  Service: Cardiovascular;  Laterality: N/A;   LOWER EXTREMITY ANGIOGRAPHY Left 11/20/2021   Procedure: Lower Extremity Angiography;  Surgeon: Marea Selinda RAMAN, MD;  Location: [facility] INVASIVE CV LAB;  Service: Cardiovascular;  Laterality: Left;   SEPTOPLASTY     Patient Active Problem List   Diagnosis Date Noted   Weakness 09/17/2023   Elevated liver enzymes 09/16/2023   Occlusion and stenosis of right carotid artery 07/26/2023   Preoperative evaluation of a medical condition to rule out surgical contraindications (TAR required) 07/26/2023   Acute non-ST segment elevation myocardial infarction (HCC) 04/10/2023   PAD S/P bilateral common and iliac artery stents 02/23/2023   History of esophageal stricture s/p dilatation in 2016 02/23/2023   AKI (acute kidney injury) 02/23/2023   Encounter for counseling 12/26/2022   Moderate aortic valve stenosis 08/06/2022   Atherosclerosis of native arteries of extremity with intermittent claudication 09/27/2021   Personal history of COVID-19 01/26/2021   Chronic use of opiate for therapeutic purpose 01/17/2021   Uncomplicated opioid dependence (HCC) 10/31/2020   Personal history of other diseases of the digestive system 10/10/2020   Lower GI bleed 10/09/2020   Uncontrolled type 2 diabetes mellitus with hypoglycemia, with long-term  current use of insulin  (HCC) 10/09/2020   CKD stage 3 secondary to diabetes (HCC) 10/09/2020   Elevated LFTs 10/09/2020   DM2 (diabetes mellitus, type 2) (HCC) 10/09/2020   CKD stage 2 due to type 2 diabetes mellitus (HCC)    History of fall 05/02/2020   Laceration without foreign body of unspecified finger without damage to nail, subsequent encounter 08/20/2019   Pharmacologic therapy 05/04/2019   Disorder of skeletal system 05/04/2019   Problems influencing health status 05/04/2019    Body mass index 31.0-31.9, adult 09/23/2018   Osteoarthritis of hip (Left) 05/26/2018   Spondylosis without myelopathy or radiculopathy, lumbosacral region 05/25/2018   Other specified dorsopathies, sacral and sacrococcygeal region 05/25/2018   Chronic low back pain (Bilateral) (R>L) w/o sciatica 05/25/2018   DDD (degenerative disc disease), lumbosacral 05/13/2018   Lumbar facet arthropathy (Multilevel) (Bilateral) 05/13/2018   Grade 1 Anterolisthesis of L4 on L5 05/13/2018   Type 2 diabetes mellitus with hyperglycemia (HCC) 10/08/2017   Preventative health care 09/16/2017   Chronic pain syndrome 08/16/2016   Pneumonia 05/28/2016   Pressure injury of skin 05/28/2016   Opioid-induced constipation (OIC) 05/17/2016   Hypokalemia 05/01/2016   Brachial plexus injury, right 04/09/2016   Carotid atherosclerosis 04/04/2016   Hypoalbuminemia 04/04/2016   Right sided weakness 04/02/2016   S/P CABG 2013 with 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 artery bypass graft) 03/30/2016   Dry mouth 03/14/2016   Glucosuria 03/14/2016   Nocturia 03/14/2016   Angina pectoris, unspecified 03/14/2016   Screening for thyroid  disorder 03/14/2016   Chest pain 02/29/2016   Intermediate coronary syndrome (HCC) 02/24/2016   Keloid skin disorder 01/17/2016   Costochondritis 01/17/2016   Long term current use of opiate analgesic 12/23/2015   Long term prescription opiate use 12/23/2015   Vitamin D  insufficiency 11/21/2015   Lumbar facet syndrome (Bilateral) (R>L) 11/21/2015   Lumbar spondylosis 11/21/2015   Elevated sedimentation rate 11/01/2015   Chronic shoulder pain (3ry area of Pain) (Right) 10/25/2015   Chronic hip pain (Left) 10/25/2015   History of alcoholism (HCC) 10/25/2015   Opiate use 10/25/2015   Encounter for therapeutic drug level monitoring 10/25/2015   Chronic chest wall pain (1ry area of Pain) (Incisional Midline) (since 04/22/2012) 10/25/2015   Chronic lower extremity pain (2ry area of Pain) (Bilateral)  (R>L) 10/25/2015   History of MI (myocardial infarction) (January 2014) 10/25/2015   Encounter for pain management planning 10/25/2015   Chronic sacroiliac joint pain (Bilateral) (L>R) 10/25/2015   Neuropathic pain 10/25/2015   Neurogenic pain 10/25/2015   Incisional pain 10/25/2015   Stasis dermatitis 09/13/2014   Mixed hyperlipidemia 03/16/2014   Coronary angioplasty status 09/25/2012   Elevated prostate specific antigen (PSA) 06/30/2012   H/O coronary artery bypass surgery 05/12/2012   Presence of coronary angioplasty implant and graft 05/01/2012   Alcohol withdrawal syndrome (HCC) 04/28/2012   Unspecified injury of unspecified kidney, initial encounter 04/25/2012   OSA (obstructive sleep apnea) 04/22/2012   Coronary artery disease 04/22/2012   Apnea, sleep 04/21/2012   Chronic obstructive pulmonary disease (HCC) 04/21/2012   COPD (chronic obstructive pulmonary disease) (HCC) 04/21/2012   Benign essential hypertension 12/25/2011   Gout 12/25/2011   Herpes zoster 12/25/2011   Encounter for screening for malignant neoplasm of colon 12/25/2011    ONSET DATE:  04/23/2024  REFERRING DIAG: J38.01 (ICD-10-CM) - Paralysis of vocal cords and larynx, unilateral   THERAPY DIAG:  Dysphonia  Rationale for Evaluation and Treatment Rehabilitation  SUBJECTIVE:   SUBJECTIVE STATEMENT: Pt voiced desire to read  all information before signing documents therefore pt remained in the reception area with evaluation starting 15 minutes late a a result Pt accompanied by: self  PERTINENT HISTORY and DIAGNOSTIC FINDINGS: Pt is a 68 year old male who 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 Pt with ENT evaluation on 05/27/2024. Note states he has hoarseness that is described as gravelly, raspy, and strained and mild to moderate in severity. He has associated difficulty swallowing (liquids and solids), dry mouth and throat clearing. The  hoarseness has been present for 5 weeks after he had a valve replacement and they went through his left carotid artery. 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 This is more than likely from the procedure.   PAIN:  Are you having pain? No   FALLS: Has patient fallen in last 6 months? 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4 seconds Sustained ah loudness average: 76 dB Average fundamental frequency during sustained "ah":184 Hz   (2 SD above average of  145 Hz +/- 23 for gender)  Oral reading (passage) loudness average: 74 dB Oral reading loudness range: 26 dB Conversational pitch average: 184 Hz Highest dynamic pitch in conversational speech: 237 Hz Lowest dynamic pitch in conversational speech: 120 Hz Conversational pitch range: 117 Hz Conversational loudness average: 74 dB Conversational loudness range: 23 dB Voice quality: hoarse, breathy, and strained    ORAL MOTOR EXAMINATION Facial : WFL Lingual: WFL Velum: WFL Mandible: WFL Cough: WFL   PATIENT REPORTED OUTCOME MEASURES (PROM):  VOICE  HANDICAP INDEX (VHI)  The Voice Handicap Index is comprised of a series of questions to assess the patient's perception of their voice. It is designed to evaluate the emotional, physical and functional components of the voice problem.  Functional: 15 Physical: 23 Emotional: 2 Total: 30 (Normal mean 8.75, SD =14.97)  z score =  1.44 (mild = 1.01-1.99)   TODAY'S TREATMENT:  N/A   PATIENT EDUCATION: Education details: ST POC 2xweek Person educated: Patient Education method: Explanation Education comprehension: needs further education   HOME EXERCISE PROGRAM: N/A     GOALS: Goals reviewed with patient? Yes  SHORT TERM GOALS: Target date: 10 sessions  The patient will increase hydration for an eventual goal of 6-8 glasses per day and limit caffeine intake (to maximum of 1-2, 8 oz cups/day), as measured by patient report.  Baseline: Goal status: INITIAL  2.  The patient will maximize voice quality and loudness using breath support/oral resonance for sustained vowel production, pitch glides, and hierarchal speech drill.  Baseline:  Goal status: INITIAL   LONG TERM GOALS: Target date: 07/28/2024  The patient will be independent for abdominal breathing and breath support exercises.  Baseline:  Goal status: INITIAL  2.  The patient will demonstrate independent understanding  of vocal hygiene concepts.  Baseline:  Goal status: INITIAL  3.  Pt will explain pictures of the structure and function of the vocal mechanism with 80% accuracy across 3 data sessions. Baseline:  Goal status: INITIAL   ASSESSMENT:  CLINICAL IMPRESSION: Patient is a 68 y.o. 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  Pt's dysphonia is c/b breathy, hoarse, strained, abnormally high pitch, short breath groups as well as habitual throat clearing.   OBJECTIVE IMPAIRMENTS include voice disorder. These impairments are limiting patient from  effectively communicating at home and in community. Factors affecting potential to achieve goals and functional outcome are severity of impairments. Patient will benefit from skilled SLP services to address above impairments and improve overall function.  REHAB POTENTIAL: Fair severity of impairment (paralyzed vocal cord)  PLAN: SLP FREQUENCY: 1-2x/week  SLP DURATION: 8 weeks  PLANNED INTERVENTIONS: SLP instruction and feedback, Compensatory strategies, and Patient/family education     Lilianna Case B. Rubbie, M.S., CCC-SLP, Tree surgeon Certified Brain Injury Specialist [facility]  [facility] Rehabilitation Services Office [phone] Ascom [phone] Fax [phone]

## 2024-06-04 ENCOUNTER — Ambulatory Visit: Admitting: Speech Pathology

## 2024-06-08 ENCOUNTER — Ambulatory Visit: Attending: Student | Admitting: Speech Pathology

## 2024-06-08 DIAGNOSIS — R490 Dysphonia: Secondary | ICD-10-CM | POA: Insufficient documentation

## 2024-06-08 NOTE — Therapy (Addendum)
 OUTPATIENT SPEECH LANGUAGE PATHOLOGY  VOICE TREATMENT   Patient Name: Gianlucas Evenson. MRN: [id number] DOB:1956/03/19, 68 y.o., male Today's Date: 06/08/2024  PCP: [facility] REFERRING PROVIDER: Almarie Roe, [place]   End of Session - 06/08/24 1312     Visit Number 2    Number of Visits 17    Date for Recertification  07/28/24    Authorization Type Medicare Part A/ Medicare Part B    Progress Note Due on Visit 10    SLP Start Time 1315    SLP Stop Time  1350    SLP Time Calculation (min) 35 min    Activity Tolerance Patient tolerated treatment well          Past Medical History:  Diagnosis Date   Aortic stenosis    Benign esophageal stricture    Cardiomyopathy, secondary (HCC)    Coronary artery disease    Diabetes mellitus without complication (HCC)    Diverticulitis    GI bleed    Gout    Hemorrhoids    Hyperlipidemia    Hypertension    Sleep apnea    Tubular adenoma of colon    Past Surgical History:  Procedure Laterality Date   cardiac bypass     CARDIAC CATHETERIZATION Left 02/03/2016   Procedure: Left Heart Cath and Coronary Angiography;  Surgeon: Vinie DELENA Jude, MD;  Location: [facility] INVASIVE CV LAB;  Service: Cardiovascular;  Laterality: Left;   CARDIAC SURGERY     COLONOSCOPY WITH PROPOFOL  N/A 04/29/2015   Procedure: COLONOSCOPY WITH PROPOFOL ;  Surgeon: Deward CINDERELLA Piedmont, MD;  Location: [facility] ENDOSCOPY;  Service: Gastroenterology;  Laterality: N/A;   COLONOSCOPY WITH PROPOFOL  N/A 07/20/2020   Procedure: COLONOSCOPY WITH PROPOFOL ;  Surgeon: Toledo, Ladell POUR, MD;  Location: [facility] ENDOSCOPY;  Service: Gastroenterology;  Laterality: N/A;   CORONARY ANGIOPLASTY WITH STENT PLACEMENT     x2   CORONARY ARTERY BYPASS GRAFT     2013 & 2017   ESOPHAGOGASTRODUODENOSCOPY (EGD) WITH PROPOFOL  N/A 04/29/2015   Procedure: ESOPHAGOGASTRODUODENOSCOPY (EGD) WITH PROPOFOL ;  Surgeon: Deward CINDERELLA Piedmont, MD;  Location: [facility] ENDOSCOPY;  Service: Gastroenterology;   Laterality: N/A;   LEFT HEART CATH N/A 04/08/2023   Procedure: Left Heart Cath;  Surgeon: Ammon Blunt, MD;  Location: [facility] INVASIVE CV LAB;  Service: Cardiovascular;  Laterality: N/A;   LOWER EXTREMITY ANGIOGRAPHY Left 11/20/2021   Procedure: Lower Extremity Angiography;  Surgeon: Marea Selinda RAMAN, MD;  Location: [facility] INVASIVE CV LAB;  Service: Cardiovascular;  Laterality: Left;   SEPTOPLASTY     Patient Active Problem List   Diagnosis Date Noted   Weakness 09/17/2023   Elevated liver enzymes 09/16/2023   Occlusion and stenosis of right carotid artery 07/26/2023   Preoperative evaluation of a medical condition to rule out surgical contraindications (TAR required) 07/26/2023   Acute non-ST segment elevation myocardial infarction (HCC) 04/10/2023   PAD S/P bilateral common and iliac artery stents 02/23/2023   History of esophageal stricture s/p dilatation in 2016 02/23/2023   AKI (acute kidney injury) 02/23/2023   Encounter for counseling 12/26/2022   Moderate aortic valve stenosis 08/06/2022   Atherosclerosis of native arteries of extremity with intermittent claudication 09/27/2021   Personal history of COVID-19 01/26/2021   Chronic use of opiate for therapeutic purpose 01/17/2021   Uncomplicated opioid dependence (HCC) 10/31/2020   Personal history of other diseases of the digestive system 10/10/2020   Lower GI bleed 10/09/2020   Uncontrolled type 2 diabetes mellitus with hypoglycemia, with long-term  current use of insulin  (HCC) 10/09/2020   CKD stage 3 secondary to diabetes (HCC) 10/09/2020   Elevated LFTs 10/09/2020   DM2 (diabetes mellitus, type 2) (HCC) 10/09/2020   CKD stage 2 due to type 2 diabetes mellitus (HCC)    History of fall 05/02/2020   Laceration without foreign body of unspecified finger without damage to nail, subsequent encounter 08/20/2019   Pharmacologic therapy 05/04/2019   Disorder of skeletal system 05/04/2019   Problems influencing health status 05/04/2019    Body mass index 31.0-31.9, adult 09/23/2018   Osteoarthritis of hip (Left) 05/26/2018   Spondylosis without myelopathy or radiculopathy, lumbosacral region 05/25/2018   Other specified dorsopathies, sacral and sacrococcygeal region 05/25/2018   Chronic low back pain (Bilateral) (R>L) w/o sciatica 05/25/2018   DDD (degenerative disc disease), lumbosacral 05/13/2018   Lumbar facet arthropathy (Multilevel) (Bilateral) 05/13/2018   Grade 1 Anterolisthesis of L4 on L5 05/13/2018   Type 2 diabetes mellitus with hyperglycemia (HCC) 10/08/2017   Preventative health care 09/16/2017   Chronic pain syndrome 08/16/2016   Pneumonia 05/28/2016   Pressure injury of skin 05/28/2016   Opioid-induced constipation (OIC) 05/17/2016   Hypokalemia 05/01/2016   Brachial plexus injury, right 04/09/2016   Carotid atherosclerosis 04/04/2016   Hypoalbuminemia 04/04/2016   Right sided weakness 04/02/2016   S/P CABG 2013 with 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 artery bypass graft) 03/30/2016   Dry mouth 03/14/2016   Glucosuria 03/14/2016   Nocturia 03/14/2016   Angina pectoris, unspecified 03/14/2016   Screening for thyroid  disorder 03/14/2016   Chest pain 02/29/2016   Intermediate coronary syndrome (HCC) 02/24/2016   Keloid skin disorder 01/17/2016   Costochondritis 01/17/2016   Long term current use of opiate analgesic 12/23/2015   Long term prescription opiate use 12/23/2015   Vitamin D  insufficiency 11/21/2015   Lumbar facet syndrome (Bilateral) (R>L) 11/21/2015   Lumbar spondylosis 11/21/2015   Elevated sedimentation rate 11/01/2015   Chronic shoulder pain (3ry area of Pain) (Right) 10/25/2015   Chronic hip pain (Left) 10/25/2015   History of alcoholism (HCC) 10/25/2015   Opiate use 10/25/2015   Encounter for therapeutic drug level monitoring 10/25/2015   Chronic chest wall pain (1ry area of Pain) (Incisional Midline) (since 04/22/2012) 10/25/2015   Chronic lower extremity pain (2ry area of Pain) (Bilateral)  (R>L) 10/25/2015   History of MI (myocardial infarction) (January 2014) 10/25/2015   Encounter for pain management planning 10/25/2015   Chronic sacroiliac joint pain (Bilateral) (L>R) 10/25/2015   Neuropathic pain 10/25/2015   Neurogenic pain 10/25/2015   Incisional pain 10/25/2015   Stasis dermatitis 09/13/2014   Mixed hyperlipidemia 03/16/2014   Coronary angioplasty status 09/25/2012   Elevated prostate specific antigen (PSA) 06/30/2012   H/O coronary artery bypass surgery 05/12/2012   Presence of coronary angioplasty implant and graft 05/01/2012   Alcohol withdrawal syndrome (HCC) 04/28/2012   Unspecified injury of unspecified kidney, initial encounter 04/25/2012   OSA (obstructive sleep apnea) 04/22/2012   Coronary artery disease 04/22/2012   Apnea, sleep 04/21/2012   Chronic obstructive pulmonary disease (HCC) 04/21/2012   COPD (chronic obstructive pulmonary disease) (HCC) 04/21/2012   Benign essential hypertension 12/25/2011   Gout 12/25/2011   Herpes zoster 12/25/2011   Encounter for screening for malignant neoplasm of colon 12/25/2011    ONSET DATE:  04/23/2024  REFERRING DIAG: J38.01 (ICD-10-CM) - Paralysis of vocal cords and larynx, unilateral   THERAPY DIAG:  Dysphonia  Rationale for Evaluation and Treatment Rehabilitation  SUBJECTIVE:    PERTINENT HISTORY and DIAGNOSTIC FINDINGS: Pt  is a 68 year old male who 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 Pt with ENT evaluation on 05/27/2024. Note states he has hoarseness that is described as gravelly, raspy, and strained and mild to moderate in severity. He has associated difficulty swallowing (liquids and solids), dry mouth and throat clearing. The hoarseness has been present for 5 weeks after he had a valve replacement and they went through his left carotid artery. 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 This is more than likely from  the procedure.   PAIN:  Are you having pain? No   FALLS: Has patient fallen in last 6 months? No,   LIVING ENVIRONMENT: Lives with: lives alone Lives in: House/apartment  PLOF: Independent  PATIENT GOALS    to improve vocal quality  SUBJECTIVE STATEMENT: Pt has background in singing, able to identify with many of the concepts taught Pt accompanied by: self  OBJECTIVE:  TODAY'S TREATMENT:  Skilled ST session targeted pt's voice goals. SLP facilitated session by providing the following interventions:   Vocal Cord adduction targeted thru pushing against ball while producing loud sustained ah - Pt observed with some improvement in vocal quality across 10 attempts.  Moderate verbal cues to achieve good quality pitch glide.   While pt was independent with using a loud voice during counting, his vocal quality was not observed to be improved.   Pt reported intermittent dizziness during productions with time allowed between exercises. Pt offered water but stated swallowing water can make me dizzy. Pt also reports low BP yesterday of 76/50 improving to 80/50. Pt reports monitoring. He continues to present with extra pyramidal movements involving the trunk, arms and head along with shuffling gait (significantly reduced strides, slow ambulation down hallway to this writer's office). When asked his arm movements, pt asked what arm movements? I wasn't aware that I had any After this question, pt observed with crossing arms for remainder of session.   Pt also presents with continual throat clearing d/t intermittent report of globus sensation.   Pt reports that he is having a stent placed week after next. Will likely need medical release before adductors exercises.    PATIENT EDUCATION: Education details: vocal card paralysis Person educated: Patient Education method: Explanation Education comprehension: needs further education   HOME EXERCISE PROGRAM: As  above     GOALS: Goals reviewed with patient? Yes  SHORT TERM GOALS: Target date: 10 sessions  The patient will increase hydration for an eventual goal of 6-8 glasses per day and limit caffeine intake (to maximum of 1-2, 8 oz cups/day), as measured by patient report.  Baseline: Goal status: INITIAL  2.  The patient will maximize voice quality and loudness using breath support/oral resonance for sustained vowel production, pitch glides, and hierarchal speech drill.  Baseline:  Goal status: INITIAL   LONG TERM GOALS: Target date: 07/28/2024  The patient will be independent for abdominal breathing and breath support exercises.  Baseline:  Goal status: INITIAL  2.  The patient will demonstrate independent understanding of vocal hygiene concepts.  Baseline:  Goal status: INITIAL  3.  Pt will explain pictures of the structure and function of the vocal mechanism with 80% accuracy across 3 data sessions. Baseline:  Goal status: INITIAL   ASSESSMENT:  CLINICAL IMPRESSION: Patient is a 68 y.o. 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  Pt's dysphonia is c/b breathy, hoarse,  strained, abnormally high pitch, short breath groups as well as habitual throat clearing.   Concern for extra pyramidal movements throughout session.   OBJECTIVE IMPAIRMENTS include voice disorder. These impairments are limiting patient from effectively communicating at home and in community. Factors affecting potential to achieve goals and functional outcome are severity of impairments. Patient will benefit from skilled SLP services to address above impairments and improve overall function.  REHAB POTENTIAL: Fair severity of impairment (paralyzed vocal cord)  PLAN: SLP FREQUENCY: 1-2x/week  SLP DURATION: 8 weeks  PLANNED INTERVENTIONS: SLP instruction and feedback, Compensatory strategies, and Patient/family education     Happi B. Rubbie,  M.S., CCC-SLP, Tree surgeon Certified Brain Injury Specialist [facility] [place]  [facility] Rehabilitation Services Office [phone] Ascom [phone] Fax [phone]

## 2024-06-09 ENCOUNTER — Ambulatory Visit: Admitting: Speech Pathology

## 2024-06-10 ENCOUNTER — Encounter: Payer: Self-pay | Admitting: Nurse Practitioner

## 2024-06-10 ENCOUNTER — Ambulatory Visit: Attending: Nurse Practitioner | Admitting: Nurse Practitioner

## 2024-06-10 VITALS — BP 117/67 | HR 67 | Temp 97.3°F | Resp 18 | Ht 60.75 in | Wt 132.0 lb

## 2024-06-10 DIAGNOSIS — M79605 Pain in left leg: Secondary | ICD-10-CM | POA: Insufficient documentation

## 2024-06-10 DIAGNOSIS — G894 Chronic pain syndrome: Secondary | ICD-10-CM | POA: Diagnosis present

## 2024-06-10 DIAGNOSIS — Z79899 Other long term (current) drug therapy: Secondary | ICD-10-CM | POA: Insufficient documentation

## 2024-06-10 DIAGNOSIS — M79604 Pain in right leg: Secondary | ICD-10-CM | POA: Diagnosis present

## 2024-06-10 DIAGNOSIS — R0789 Other chest pain: Secondary | ICD-10-CM | POA: Diagnosis present

## 2024-06-10 DIAGNOSIS — G8929 Other chronic pain: Secondary | ICD-10-CM | POA: Insufficient documentation

## 2024-06-10 DIAGNOSIS — M545 Low back pain, unspecified: Secondary | ICD-10-CM | POA: Diagnosis present

## 2024-06-10 MED ORDER — BUPRENORPHINE 7.5 MCG/HR TD PTWK: 1.0000 | MEDICATED_PATCH | TRANSDERMAL | 2 refills | Status: DC

## 2024-06-10 NOTE — Progress Notes (Signed)
 PROVIDER NOTE: Interpretation of information contained herein should be left to medically-trained personnel. Specific patient instructions are provided elsewhere under Patient Instructions section of medical record. This document was created in part using AI and STT-dictation technology, any transcriptional errors that may result from this process are unintentional.  Patient: [name] [name].  Service: E/M   PCP: [facility]  DOB: [birth year]  DOS: 06/10/2024  Provider: Emmy MARLA Blanch, NP  MRN: [id number]  Delivery: Face-to-face  Specialty: Interventional Pain Management  Type: Established Patient  Setting: Ambulatory outpatient facility  Specialty designation: 09  Referring Prov.: [facility]*  Location: Outpatient office facility       History of present illness (HPI) [name], a 68 y.o. year old male, is here today because of his (Chest), Leg Pain, and Back Pain. [name] primary complain today is Chest Pain (Chest), Leg Pain, and Back Pain  Pertinent problems: [name] has Chronic shoulder pain (3ry area of pain) (Right); Chronic hip pain (Left); Chronic chest wall pain (1ry area of pain) (Incisional Midline) (Since 04/22/2012); Chronic lower extremity pain (2ry area of pain) (Bilateral) (R>L); Neurogenic pain; and Chronic pain syndrome on their pertinent problem list.  Pain Assessment: Severity of Chronic pain is reported as a 8 /10. Location: Chest Left, Right, Mid/All over body. Onset: More than a month ago. Quality: Aching, Burning, Sharp, Stabbing. Timing: Constant. Modifying factor(s): Medications. Vitals:  height is 5' 0.75 (1.543 m) and weight is 132 lb (59.9 kg). His temporal temperature is 97.3 F (36.3 C) (abnormal). His blood pressure is 117/67 and his pulse is 67. His respiration is 18 and oxygen saturation is 100%.  BMI: Estimated body mass index is 25.15 kg/m as calculated from the following:   Height as of this encounter: 5'  0.75 (1.543 m).   Weight as of this encounter: 132 lb (59.9 kg).  Last encounter: 03/23/2024. Last procedure: Visit date not found.  Reason for encounter: medication management. The patient indicates doing well with current medication regimen. No side effects or adverse reaction reported to medication. The patient complains of generalized pain; however current medication regimen help to manage pain.  The patient is currently using the Butrans  7.5 mcg/h patch that helped to manage her pain.  Pharmacotherapy Assessment   Butrans  7.5 mcg/hr, 1 patch placed onto the skin once a week for pain  Monitoring: [place] PMP: PDMP reviewed during this encounter.       Pharmacotherapy: No side-effects or adverse reactions reported. Compliance: No problems identified. Effectiveness: Clinically acceptable.  [name] Doyal [place], [place]  06/10/2024 10:03 AM  Sign when Signing Visit Nursing Pain Medication Assessment:  Safety precautions to be maintained throughout the outpatient stay will include: orient to surroundings, keep bed in low position, maintain call bell within reach at all times, provide assistance with transfer out of bed and ambulation.  Medication Inspection Compliance: Pill count conducted under aseptic conditions, in front of the patient. Neither the pills nor the bottle was removed from the patient's sight at any time. Once count was completed pills were immediately returned to the patient in their original bottle.  Medication: Buprenorphine  patch (Butrans ) Pill/Patch Count: 1 of 4 pills/patches remain Pill/Patch Appearance: Markings consistent with prescribed medication Bottle Appearance: Standard pharmacy container. Clearly labeled. Filled Date: 09 / 12 / 2025 Last Medication intake:  06/05/2024    UDS:  Summary  Date Value Ref Range Status  10/16/2023 FINAL  Final    Comment:    ====================================================================  [employer] Select 13  (MW) ==================================================================== Test                             Result       Flag       Units  Drug Present and Declared for Prescription Verification   Buprenorphine                   11           EXPECTED   ng/mg creat   Norbuprenorphine               10           EXPECTED   ng/mg creat    Source of buprenorphine  is a scheduled prescription medication.    Norbuprenorphine is an expected metabolite of buprenorphine .  ==================================================================== Test                      Result    Flag   Units      Ref Range   Creatinine              232              mg/dL      >=79 ==================================================================== Declared Medications:  The flagging and interpretation on this report are based on the  following declared medications.  Unexpected results may arise from  inaccuracies in the declared medications.   **Note: The testing scope of this panel does not include small to  moderate amounts of these reported medications:   Buprenorphine  Patch (BuTrans )   **Note: The testing scope of this panel does not include the  following reported medications:   Allopurinol  (Zyloprim )  Aspirin   Atorvastatin  (Lipitor)  Cholecalciferol   Clopidogrel  (Plavix )  Folic Acid   Furosemide  (Lasix )  Gabapentin  (Neurontin )  Insulin  Lelon)  Irbesartan  (Avapro )  Isosorbide  (Imdur )  Metoprolol  (Toprol )  Multivitamin  Nitroglycerin  (Nitrostat )  Potassium  Ranolazine  (Ranexa )  Semaglutide  Vitamin B1 ==================================================================== For clinical consultation, please call [phone]. ====================================================================     No results found for: CBDTHCR No results found for: D8THCCBX No results found for: D9THCCBX  ROS  Constitutional: Denies any fever or chills Gastrointestinal: No reported hemesis,  hematochezia, vomiting, or acute GI distress Musculoskeletal: Back pain, leg pain, chest pain (pain due to cardiac bypass) Neurological: No reported episodes of acute onset apraxia, aphasia, dysarthria, agnosia, amnesia, paralysis, loss of coordination, or loss of consciousness  Medication Review  Cholecalciferol , Semaglutide (1 MG/DOSE), allopurinol , aspirin  EC, atorvastatin , buprenorphine , clopidogrel , dapagliflozin propanediol, furosemide , gabapentin , insulin  degludec, irbesartan , isosorbide  mononitrate, metoprolol  succinate, multivitamin with minerals, naloxone , nitroGLYCERIN , and spironolactone  History Review  Allergy: Mr. Ogas has no known allergies. Drug: Mr. Presti  reports no history of drug use. Alcohol:  reports current alcohol use of about 14.0 standard drinks of alcohol per week. Tobacco:  reports that he has quit smoking. His smoking use included cigarettes. He has quit using smokeless tobacco. Social: Mr. Holzschuh  reports that he has quit smoking. His smoking use included cigarettes. He has quit using smokeless tobacco. He reports current alcohol use of about 14.0 standard drinks of alcohol per week. He reports that he does not use drugs. Medical:  has a past medical history of Aortic stenosis, Benign esophageal stricture, Cardiomyopathy, secondary (HCC), Coronary artery disease, Diabetes mellitus without complication (HCC), Diverticulitis, GI bleed, Gout, Hemorrhoids, Hyperlipidemia, Hypertension, Sleep apnea, and Tubular adenoma of colon. Surgical: Mr. Sorbo  has a past surgical history that includes Coronary angioplasty with stent; cardiac bypass; Cardiac surgery; Colonoscopy with propofol  (N/A, 04/29/2015); Esophagogastroduodenoscopy (egd) with propofol  (N/A, 04/29/2015); Septoplasty; Cardiac catheterization (Left, 02/03/2016); Coronary artery bypass graft; Colonoscopy with propofol  (N/A, 07/20/2020); Lower Extremity Angiography (Left, 11/20/2021); and Left Heart Cath (N/A,  04/08/2023). Family: family history includes Heart disease in his mother; Hypertension in his father.  Laboratory Chemistry Profile   Renal Lab Results  Component Value Date   BUN 17 01/31/2024   CREATININE 1.17 01/31/2024   GFRAA >60 05/07/2019   GFRNONAA >60 01/31/2024    Hepatic Lab Results  Component Value Date   AST 40 01/31/2024   ALT 27 01/31/2024   ALBUMIN 3.9 01/31/2024   ALKPHOS 121 01/31/2024   HCVAB NON REACTIVE 10/09/2020   LIPASE 35 01/31/2024    Electrolytes Lab Results  Component Value Date   NA 136 01/31/2024   K 4.6 01/31/2024   CL 103 01/31/2024   CALCIUM  8.9 01/31/2024   MG 2.2 10/09/2020   PHOS 3.7 10/09/2020    Bone Lab Results  Component Value Date   VD25OH 74.7 05/07/2019   VD125OH2TOT 30.8 10/26/2015    Inflammation (CRP: Acute Phase) (ESR: Chronic Phase) Lab Results  Component Value Date   CRP 0.9 05/07/2019   ESRSEDRATE 41 (H) 05/07/2019   LATICACIDVEN 1.9 10/09/2020         Note: Above Lab results reviewed.  Recent Imaging Review  VAS US  ABI WITH/WO TBI  LOWER EXTREMITY DOPPLER STUDY  Patient Name:  MARKEES CARNS  Date of Exam:   05/12/2024 Medical Rec #: [id number]       Accession #:    [id number] Date of Birth: 01/28/1956        Patient Gender: M Patient Age:   68 years Exam Location:  [facility] Procedure:      VAS US  ABI WITH/WO TBI Referring Phys:  --------------------------------------------------------------------------------   Indications: Peripheral artery disease.  High Risk Factors: Hyperlipidemia, prior MI, coronary artery disease.   Vascular Interventions: 11/20/21: Bilateral CIA stents;.  Comparison Study: 05/2023  Performing Technologist: Jerel Croak RVT    Examination Guidelines: A complete evaluation includes at minimum, Doppler waveform signals and systolic blood pressure reading at the level of bilateral brachial, anterior tibial, and posterior tibial arteries, when vessel segments are  accessible. Bilateral testing is considered an integral part of a complete examination. Photoelectric Plethysmograph (PPG) waveforms and toe systolic pressure readings are included as required and additional duplex testing as needed. Limited examinations for reoccurring indications may be performed as noted.    ABI Findings: +---------+------------------+-----+--------+--------+ Right    Rt Pressure (mmHg)IndexWaveformComment  +---------+------------------+-----+--------+--------+ Brachial 117                                     +---------+------------------+-----+--------+--------+ PTA      131               1.09 biphasic         +---------+------------------+-----+--------+--------+ DP       134               1.12 biphasic         +---------+------------------+-----+--------+--------+ Great Toe98                0.82 Normal           +---------+------------------+-----+--------+--------+  +---------+------------------+-----+----------+-------+ Left     Lt Pressure (  mmHg)IndexWaveform  Comment +---------+------------------+-----+----------+-------+ Brachial 120                                      +---------+------------------+-----+----------+-------+ PTA      142               1.18 monophasic        +---------+------------------+-----+----------+-------+ DP       109               0.91 monophasic        +---------+------------------+-----+----------+-------+ Great Toe84                0.70 Normal            +---------+------------------+-----+----------+-------+  +-------+-----------+-----------+------------+------------+ ABI/TBIToday's ABIToday's TBIPrevious ABIPrevious TBI +-------+-----------+-----------+------------+------------+ Right  1.12       .82        1.10        .94          +-------+-----------+-----------+------------+------------+ Left   1.18       .70        .83         .76           +-------+-----------+-----------+------------+------------+     Left ABIs appear increased compared to prior study on 05/2023. Right ABIs and TBIs appear essentially unchanged compared to prior study on 05/2023.   Summary: Right: Resting right ankle-brachial index is within normal range. The right toe-brachial index is normal.   Left: Resting left ankle-brachial index is within normal range. The left toe-brachial index is normal.    *See table(s) above for measurements and observations.    Electronically signed by Selinda Gu MD on 05/13/2024 at 7:25:11 AM.      Final   Note: Reviewed        Physical Exam  Vitals: BP 117/67 (BP Location: Right Arm, Patient Position: Sitting, Cuff Size: Normal)   Pulse 67   Temp (!) 97.3 F (36.3 C) (Temporal)   Resp 18   Ht 5' 0.75 (1.543 m)   Wt 132 lb (59.9 kg)   SpO2 100%   BMI 25.15 kg/m  BMI: Estimated body mass index is 25.15 kg/m as calculated from the following:   Height as of this encounter: 5' 0.75 (1.543 m).   Weight as of this encounter: 132 lb (59.9 kg). Ideal: Ideal body weight: 51.7 kg (114 lb 0.5 oz) Adjusted ideal body weight: 55 kg (121 lb 3.5 oz) General appearance: Well nourished, well developed, and well hydrated. In no apparent acute distress Mental status: Alert, oriented x 3 (person, place, & time)       Respiratory: No evidence of acute respiratory distress Eyes: PERLA  Musculoskeletal: +LBP,  bilateral leg pain Assessment   Diagnosis Status  1. Chronic chest wall pain (1ry area of Pain) (Incisional Midline) (since 04/22/2012)   2. Chronic lower extremity pain (2ry area of Pain) (Bilateral) (R>L)   3. Chronic low back pain (Bilateral) (R>L) w/o sciatica   4. Chronic pain syndrome   5. Encounter for medication management    Controlled Controlled Controlled   Updated Problems: No problems updated.  Plan of Care  Problem-specific:  Assessment and Plan Encounter for medication  management: buprenorphine  (BUTRANS ) 7.5 MCG/HR       Sig: Place 1 patch onto the skin once a week for 28 days.      Dispense:  4 patch  Refill:  0      Chronic Pain: STOP Act (Not applicable) Fill 1 day early if closed on refill date. Avoid benzodiazepines within 8 hours of opioids   buprenorphine  (BUTRANS ) 7.5 MCG/HR      Sig: Place 1 patch onto the skin once a week for 28 days.      Dispense:  4 patch      Refill:  0      Chronic Pain: STOP Act (Not applicable) Fill 1 day early if closed on refill date. Avoid benzodiazepines within 8 hours of opioids   buprenorphine  (BUTRANS ) 7.5 MCG/HR      Sig: Place 1 patch onto the skin once a week for 28 days.      Dispense:  4 patch      Refill:  0      Chronic Pain: STOP Act (Not applicable) Fill 1 day early if closed on refill date. Avoid benzodiazepines within 8 hours of opioids    Chronic pain syndrome: Patient's pain is well-controlled with Butrans  patch, continue on current medication regimen. Prescription drug monitoring (PDMP) reviewed; findings consistent with the use of prescribed medication and no evidence of narcotic misuse or abuse.  Urine drug screening (UDS) up-to-date.  Schedule follow-up in 90 days for medication management.  Chronic chest wall pain: Patient's pain is well-controlled with Butrans  patch, continue on current medication regimen.  Patient continues experiencing chest wall pain after cardiac bypass, Butrans  patch well-managed with pain level.   Mr. Tavoris Brisk. has a current medication list which includes the following long-term medication(s): allopurinol , atorvastatin , furosemide , gabapentin , irbesartan , isosorbide  mononitrate, metoprolol  succinate, spironolactone, and buprenorphine .  Pharmacotherapy (Medications Ordered): Meds ordered this encounter  Medications   buprenorphine  (BUTRANS ) 7.5 MCG/HR    Sig: Place 1 patch onto the skin once a week.    Dispense:  4 patch    Refill:  2    Chronic Pain: STOP  Act (Not applicable) Fill 1 day early if closed on refill date. Avoid benzodiazepines within 8 hours of opioids   Orders:  No orders of the defined types were placed in this encounter.       Return in about 3 months (around 09/10/2024) for (F2F), (MM), Emmy Blanch NP.    Recent Visits Date Type Provider Dept  03/23/24 Office Visit Crystel Demarco K, NP [facility]  Showing recent visits within past 90 days and meeting all other requirements Today's Visits Date Type Provider Dept  06/10/24 Office Visit Markiyah Gahm K, NP [facility]  Showing today's visits and meeting all other requirements Future Appointments Date Type Provider Dept  08/31/24 Appointment Diron Haddon K, NP [facility]  Showing future appointments within next 90 days and meeting all other requirements  I discussed the assessment and treatment plan with the patient. The patient was provided an opportunity to ask questions and all were answered. The patient agreed with the plan and demonstrated an understanding of the instructions.  Patient advised to call back or seek an in-person evaluation if the symptoms or condition worsens.  I personally spent a total of 30 minutes in the care of the patient today including preparing to see the patient, getting/reviewing separately obtained history, performing a medically appropriate exam/evaluation, counseling and educating, placing orders, referring and communicating with other health care professionals, documenting clinical information in the EHR, independently interpreting results, communicating results, and coordinating care.  Note by: Emmy MARLA Blanch, NP  Date: 06/10/2024; Time: 11:20 AM

## 2024-06-10 NOTE — Progress Notes (Signed)
 Nursing Pain Medication Assessment:  Safety precautions to be maintained throughout the outpatient stay will include: orient to surroundings, keep bed in low position, maintain call bell within reach at all times, provide assistance with transfer out of bed and ambulation.  Medication Inspection Compliance: Pill count conducted under aseptic conditions, in front of the patient. Neither the pills nor the bottle was removed from the patient's sight at any time. Once count was completed pills were immediately returned to the patient in their original bottle.  Medication: Buprenorphine  patch (Butrans ) Pill/Patch Count: 1 of 4 pills/patches remain Pill/Patch Appearance: Markings consistent with prescribed medication Bottle Appearance: Standard pharmacy container. Clearly labeled. Filled Date: 09 / 12 / 2025 Last Medication intake:  06/05/2024

## 2024-06-11 ENCOUNTER — Encounter: Admitting: Speech Pathology

## 2024-06-15 ENCOUNTER — Encounter: Admitting: Nurse Practitioner

## 2024-06-15 ENCOUNTER — Ambulatory Visit: Admitting: Speech Pathology

## 2024-06-15 DIAGNOSIS — R490 Dysphonia: Secondary | ICD-10-CM

## 2024-06-15 NOTE — Therapy (Signed)
 OUTPATIENT SPEECH LANGUAGE PATHOLOGY  VOICE TREATMENT   Patient Name: [name] [name]. MRN: [id number] DOB:[birth year], 68 y.o., male Today's Date: 06/15/2024  PCP: [facility] REFERRING PROVIDER: Almarie Roe, [place]   End of Session - 06/15/24 1347     Visit Number 3    Number of Visits 17    Date for Recertification  07/28/24    Authorization Type Medicare Part A/ Medicare Part B    Progress Note Due on Visit 10    SLP Start Time 1315    SLP Stop Time  1350    SLP Time Calculation (min) 35 min    Activity Tolerance Patient tolerated treatment well          Past Medical History:  Diagnosis Date   Aortic stenosis    Benign esophageal stricture    Cardiomyopathy, secondary (HCC)    Coronary artery disease    Diabetes mellitus without complication (HCC)    Diverticulitis    GI bleed    Gout    Hemorrhoids    Hyperlipidemia    Hypertension    Sleep apnea    Tubular adenoma of colon    Past Surgical History:  Procedure Laterality Date   cardiac bypass     CARDIAC CATHETERIZATION Left 02/03/2016   Procedure: Left Heart Cath and Coronary Angiography;  Surgeon: Vinie DELENA Jude, MD;  Location: [facility] INVASIVE CV LAB;  Service: Cardiovascular;  Laterality: Left;   CARDIAC SURGERY     COLONOSCOPY WITH PROPOFOL  N/A 04/29/2015   Procedure: COLONOSCOPY WITH PROPOFOL ;  Surgeon: Deward CINDERELLA Piedmont, MD;  Location: [facility] ENDOSCOPY;  Service: Gastroenterology;  Laterality: N/A;   COLONOSCOPY WITH PROPOFOL  N/A 07/20/2020   Procedure: COLONOSCOPY WITH PROPOFOL ;  Surgeon: Toledo, Ladell POUR, MD;  Location: [facility] ENDOSCOPY;  Service: Gastroenterology;  Laterality: N/A;   CORONARY ANGIOPLASTY WITH STENT PLACEMENT     x2   CORONARY ARTERY BYPASS GRAFT     2013 & 2017   ESOPHAGOGASTRODUODENOSCOPY (EGD) WITH PROPOFOL  N/A 04/29/2015   Procedure: ESOPHAGOGASTRODUODENOSCOPY (EGD) WITH PROPOFOL ;  Surgeon: Deward CINDERELLA Piedmont, MD;  Location: [facility] ENDOSCOPY;  Service: Gastroenterology;   Laterality: N/A;   LEFT HEART CATH N/A 04/08/2023   Procedure: Left Heart Cath;  Surgeon: Ammon Blunt, MD;  Location: [facility] INVASIVE CV LAB;  Service: Cardiovascular;  Laterality: N/A;   LOWER EXTREMITY ANGIOGRAPHY Left 11/20/2021   Procedure: Lower Extremity Angiography;  Surgeon: Marea Selinda RAMAN, MD;  Location: [facility] INVASIVE CV LAB;  Service: Cardiovascular;  Laterality: Left;   SEPTOPLASTY     Patient Active Problem List   Diagnosis Date Noted   Weakness 09/17/2023   Elevated liver enzymes 09/16/2023   Occlusion and stenosis of right carotid artery 07/26/2023   Preoperative evaluation of a medical condition to rule out surgical contraindications (TAR required) 07/26/2023   Acute non-ST segment elevation myocardial infarction (HCC) 04/10/2023   PAD S/P bilateral common and iliac artery stents 02/23/2023   History of esophageal stricture s/p dilatation in 2016 02/23/2023   AKI (acute kidney injury) 02/23/2023   Encounter for counseling 12/26/2022   Moderate aortic valve stenosis 08/06/2022   Atherosclerosis of native arteries of extremity with intermittent claudication 09/27/2021   Personal history of COVID-19 01/26/2021   Chronic use of opiate for therapeutic purpose 01/17/2021   Uncomplicated opioid dependence (HCC) 10/31/2020   Personal history of other diseases of the digestive system 10/10/2020   Lower GI bleed 10/09/2020   Uncontrolled type 2 diabetes mellitus with hypoglycemia, with long-term  current use of insulin  (HCC) 10/09/2020   CKD stage 3 secondary to diabetes (HCC) 10/09/2020   Elevated LFTs 10/09/2020   DM2 (diabetes mellitus, type 2) (HCC) 10/09/2020   CKD stage 2 due to type 2 diabetes mellitus (HCC)    History of fall 05/02/2020   Laceration without foreign body of unspecified finger without damage to nail, subsequent encounter 08/20/2019   Pharmacologic therapy 05/04/2019   Disorder of skeletal system 05/04/2019   Problems influencing health status 05/04/2019    Body mass index 31.0-31.9, adult 09/23/2018   Osteoarthritis of hip (Left) 05/26/2018   Spondylosis without myelopathy or radiculopathy, lumbosacral region 05/25/2018   Other specified dorsopathies, sacral and sacrococcygeal region 05/25/2018   Chronic low back pain (Bilateral) (R>L) w/o sciatica 05/25/2018   DDD (degenerative disc disease), lumbosacral 05/13/2018   Lumbar facet arthropathy (Multilevel) (Bilateral) 05/13/2018   Grade 1 Anterolisthesis of L4 on L5 05/13/2018   Type 2 diabetes mellitus with hyperglycemia (HCC) 10/08/2017   Preventative health care 09/16/2017   Chronic pain syndrome 08/16/2016   Pneumonia 05/28/2016   Pressure injury of skin 05/28/2016   Opioid-induced constipation (OIC) 05/17/2016   Hypokalemia 05/01/2016   Brachial plexus injury, right 04/09/2016   Carotid atherosclerosis 04/04/2016   Hypoalbuminemia 04/04/2016   Right sided weakness 04/02/2016   S/P CABG 2013 with 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 artery bypass graft) 03/30/2016   Dry mouth 03/14/2016   Glucosuria 03/14/2016   Nocturia 03/14/2016   Angina pectoris, unspecified 03/14/2016   Screening for thyroid  disorder 03/14/2016   Chest pain 02/29/2016   Intermediate coronary syndrome (HCC) 02/24/2016   Keloid skin disorder 01/17/2016   Costochondritis 01/17/2016   Long term current use of opiate analgesic 12/23/2015   Long term prescription opiate use 12/23/2015   Vitamin D  insufficiency 11/21/2015   Lumbar facet syndrome (Bilateral) (R>L) 11/21/2015   Lumbar spondylosis 11/21/2015   Elevated sedimentation rate 11/01/2015   Chronic shoulder pain (3ry area of Pain) (Right) 10/25/2015   Chronic hip pain (Left) 10/25/2015   History of alcoholism (HCC) 10/25/2015   Opiate use 10/25/2015   Encounter for therapeutic drug level monitoring 10/25/2015   Chronic chest wall pain (1ry area of Pain) (Incisional Midline) (since 04/22/2012) 10/25/2015   Chronic lower extremity pain (2ry area of Pain) (Bilateral)  (R>L) 10/25/2015   History of MI (myocardial infarction) (January 2014) 10/25/2015   Encounter for pain management planning 10/25/2015   Chronic sacroiliac joint pain (Bilateral) (L>R) 10/25/2015   Neuropathic pain 10/25/2015   Neurogenic pain 10/25/2015   Incisional pain 10/25/2015   Stasis dermatitis 09/13/2014   Mixed hyperlipidemia 03/16/2014   Coronary angioplasty status 09/25/2012   Elevated prostate specific antigen (PSA) 06/30/2012   H/O coronary artery bypass surgery 05/12/2012   Presence of coronary angioplasty implant and graft 05/01/2012   Alcohol withdrawal syndrome (HCC) 04/28/2012   Unspecified injury of unspecified kidney, initial encounter 04/25/2012   OSA (obstructive sleep apnea) 04/22/2012   Coronary artery disease 04/22/2012   Apnea, sleep 04/21/2012   Chronic obstructive pulmonary disease (HCC) 04/21/2012   COPD (chronic obstructive pulmonary disease) (HCC) 04/21/2012   Benign essential hypertension 12/25/2011   Gout 12/25/2011   Herpes zoster 12/25/2011   Encounter for screening for malignant neoplasm of colon 12/25/2011    ONSET DATE:  04/23/2024  REFERRING DIAG: J38.01 (ICD-10-CM) - Paralysis of vocal cords and larynx, unilateral   THERAPY DIAG:  Dysphonia  Rationale for Evaluation and Treatment Rehabilitation  SUBJECTIVE:    PERTINENT HISTORY and DIAGNOSTIC FINDINGS: Pt  is a 68 year old male who 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 Pt with ENT evaluation on 05/27/2024. Note states he has hoarseness that is described as gravelly, raspy, and strained and mild to moderate in severity. He has associated difficulty swallowing (liquids and solids), dry mouth and throat clearing. The hoarseness has been present for 5 weeks after he had a valve replacement and they went through his left carotid artery. 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 This is more than likely from  the procedure.   PAIN:  Are you having pain? No   FALLS: Has patient fallen in last 6 months? No,   LIVING ENVIRONMENT: Lives with: lives alone Lives in: House/apartment  PLOF: Independent  PATIENT GOALS    to improve vocal quality  SUBJECTIVE STATEMENT: Pt eager, connected exercises to singing background Pt accompanied by: self  OBJECTIVE:  TODAY'S TREATMENT:  Skilled ST session targeted pt's voice goals. SLP facilitated session by providing the following interventions:   Vocal Cord adduction targeted thru pushing against ball while producing loud sustained ah - Pt independent with 2 sets of 10 repetitions, no observed improvement in vocal quality. Pt reports I get lightheaded when I take deeper breaths like that.  Pitch glide: Pt independent to achieve two sets of 10 repetitions pitch glide with strong breath support, no observed improvement in vocal quality.   While pt was independent with using a loud voice during counting, his vocal quality was not observed to be improved. Pt completed 3 sets.   SLP introduced Semi-Occluded Vocal Tract exercise - Pt independently able to complete with strong abdominal breath support observed. Recommend pt to add exercise to HEP.   Pt also presents with continual throat clearing and cough. Pt given cup of water with lid and straw. Pt reports sometimes with like drinking it affects...like I cough. Education provide don role of vocal folds in establishing airway protection. Will continue to monitor with pt reporting decreased interest in pursuing an instrumental study as he is not interested in life style modifications.   PATIENT EDUCATION: Education details: vocal card paralysis Person educated: Patient Education method: Explanation Education comprehension: needs further education   HOME EXERCISE PROGRAM: As above     GOALS: Goals reviewed with patient? Yes  SHORT TERM GOALS: Target date: 10 sessions  The patient will  increase hydration for an eventual goal of 6-8 glasses per day and limit caffeine intake (to maximum of 1-2, 8 oz cups/day), as measured by patient report.  Baseline: Goal status: INITIAL  2.  The patient will maximize voice quality and loudness using breath support/oral resonance for sustained vowel production, pitch glides, and hierarchal speech drill.  Baseline:  Goal status: INITIAL   LONG TERM GOALS: Target date: 07/28/2024  The patient will be independent for abdominal breathing and breath support exercises.  Baseline:  Goal status: INITIAL  2.  The patient will demonstrate independent understanding of vocal hygiene concepts.  Baseline:  Goal status: INITIAL  3.  Pt will explain pictures of the structure and function of the vocal mechanism with 80% accuracy across 3 data sessions. Baseline:  Goal status: INITIAL   ASSESSMENT:  CLINICAL IMPRESSION: Patient is a 68 y.o. 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  Pt's dysphonia is c/b breathy, hoarse, strained, abnormally high pitch, short breath groups as well as habitual throat clearing.   Concern for  extra pyramidal movements throughout session. Pt is not interested in referral to neurologist. See the above note for details about this session.   OBJECTIVE IMPAIRMENTS include voice disorder. These impairments are limiting patient from effectively communicating at home and in community. Factors affecting potential to achieve goals and functional outcome are severity of impairments. Patient will benefit from skilled SLP services to address above impairments and improve overall function.  REHAB POTENTIAL: Fair severity of impairment (paralyzed vocal cord)  PLAN: SLP FREQUENCY: 1-2x/week  SLP DURATION: 8 weeks  PLANNED INTERVENTIONS: SLP instruction and feedback, Compensatory strategies, and Patient/family education     Happi B. Rubbie, M.S., CCC-SLP,  Tree surgeon Certified Brain Injury Specialist [facility]  [facility] Office [phone] Ascom [phone] Fax [phone]

## 2024-06-16 ENCOUNTER — Encounter: Admitting: Speech Pathology

## 2024-06-17 ENCOUNTER — Ambulatory Visit: Admitting: Speech Pathology

## 2024-06-17 DIAGNOSIS — R490 Dysphonia: Secondary | ICD-10-CM

## 2024-06-17 NOTE — Therapy (Addendum)
 OUTPATIENT SPEECH LANGUAGE PATHOLOGY  VOICE TREATMENT   Patient Name: [name] [name]. MRN: [id number] DOB:1956/05/27, 68 y.o., male Today's Date: 06/17/2024  PCP: [facility] REFERRING PROVIDER: Almarie Roe, [place]   End of Session - 06/17/24 1300     Visit Number 4    Number of Visits 17    Date for Recertification  07/28/24    Authorization Type Medicare Part A/ Medicare Part B    Progress Note Due on Visit 10    SLP Start Time 1315    SLP Stop Time  1400    SLP Time Calculation (min) 45 min    Activity Tolerance Patient tolerated treatment well          Past Medical History:  Diagnosis Date   Aortic stenosis    Benign esophageal stricture    Cardiomyopathy, secondary (HCC)    Coronary artery disease    Diabetes mellitus without complication (HCC)    Diverticulitis    GI bleed    Gout    Hemorrhoids    Hyperlipidemia    Hypertension    Sleep apnea    Tubular adenoma of colon    Past Surgical History:  Procedure Laterality Date   cardiac bypass     CARDIAC CATHETERIZATION Left 02/03/2016   Procedure: Left Heart Cath and Coronary Angiography;  Surgeon: Vinie DELENA Jude, MD;  Location: [facility] INVASIVE CV LAB;  Service: Cardiovascular;  Laterality: Left;   CARDIAC SURGERY     COLONOSCOPY WITH PROPOFOL  N/A 04/29/2015   Procedure: COLONOSCOPY WITH PROPOFOL ;  Surgeon: Deward CINDERELLA Piedmont, MD;  Location: [facility] ENDOSCOPY;  Service: Gastroenterology;  Laterality: N/A;   COLONOSCOPY WITH PROPOFOL  N/A 07/20/2020   Procedure: COLONOSCOPY WITH PROPOFOL ;  Surgeon: Toledo, Ladell POUR, MD;  Location: [facility] ENDOSCOPY;  Service: Gastroenterology;  Laterality: N/A;   CORONARY ANGIOPLASTY WITH STENT PLACEMENT     x2   CORONARY ARTERY BYPASS GRAFT     2013 & 2017   ESOPHAGOGASTRODUODENOSCOPY (EGD) WITH PROPOFOL  N/A 04/29/2015   Procedure: ESOPHAGOGASTRODUODENOSCOPY (EGD) WITH PROPOFOL ;  Surgeon: Deward CINDERELLA Piedmont, MD;  Location: [facility] ENDOSCOPY;  Service: Gastroenterology;   Laterality: N/A;   LEFT HEART CATH N/A 04/08/2023   Procedure: Left Heart Cath;  Surgeon: Ammon Blunt, MD;  Location: [facility] INVASIVE CV LAB;  Service: Cardiovascular;  Laterality: N/A;   LOWER EXTREMITY ANGIOGRAPHY Left 11/20/2021   Procedure: Lower Extremity Angiography;  Surgeon: Marea Selinda RAMAN, MD;  Location: [facility] INVASIVE CV LAB;  Service: Cardiovascular;  Laterality: Left;   SEPTOPLASTY     Patient Active Problem List   Diagnosis Date Noted   Weakness 09/17/2023   Elevated liver enzymes 09/16/2023   Occlusion and stenosis of right carotid artery 07/26/2023   Preoperative evaluation of a medical condition to rule out surgical contraindications (TAR required) 07/26/2023   Acute non-ST segment elevation myocardial infarction (HCC) 04/10/2023   PAD S/P bilateral common and iliac artery stents 02/23/2023   History of esophageal stricture s/p dilatation in 2016 02/23/2023   AKI (acute kidney injury) 02/23/2023   Encounter for counseling 12/26/2022   Moderate aortic valve stenosis 08/06/2022   Atherosclerosis of native arteries of extremity with intermittent claudication 09/27/2021   Personal history of COVID-19 01/26/2021   Chronic use of opiate for therapeutic purpose 01/17/2021   Uncomplicated opioid dependence (HCC) 10/31/2020   Personal history of other diseases of the digestive system 10/10/2020   Lower GI bleed 10/09/2020   Uncontrolled type 2 diabetes mellitus with hypoglycemia, with long-term  current use of insulin  (HCC) 10/09/2020   CKD stage 3 secondary to diabetes (HCC) 10/09/2020   Elevated LFTs 10/09/2020   DM2 (diabetes mellitus, type 2) (HCC) 10/09/2020   CKD stage 2 due to type 2 diabetes mellitus (HCC)    History of fall 05/02/2020   Laceration without foreign body of unspecified finger without damage to nail, subsequent encounter 08/20/2019   Pharmacologic therapy 05/04/2019   Disorder of skeletal system 05/04/2019   Problems influencing health status 05/04/2019    Body mass index 31.0-31.9, adult 09/23/2018   Osteoarthritis of hip (Left) 05/26/2018   Spondylosis without myelopathy or radiculopathy, lumbosacral region 05/25/2018   Other specified dorsopathies, sacral and sacrococcygeal region 05/25/2018   Chronic low back pain (Bilateral) (R>L) w/o sciatica 05/25/2018   DDD (degenerative disc disease), lumbosacral 05/13/2018   Lumbar facet arthropathy (Multilevel) (Bilateral) 05/13/2018   Grade 1 Anterolisthesis of L4 on L5 05/13/2018   Type 2 diabetes mellitus with hyperglycemia (HCC) 10/08/2017   Preventative health care 09/16/2017   Chronic pain syndrome 08/16/2016   Pneumonia 05/28/2016   Pressure injury of skin 05/28/2016   Opioid-induced constipation (OIC) 05/17/2016   Hypokalemia 05/01/2016   Brachial plexus injury, right 04/09/2016   Carotid atherosclerosis 04/04/2016   Hypoalbuminemia 04/04/2016   Right sided weakness 04/02/2016   S/P CABG 2013 with 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 artery bypass graft) 03/30/2016   Dry mouth 03/14/2016   Glucosuria 03/14/2016   Nocturia 03/14/2016   Angina pectoris, unspecified 03/14/2016   Screening for thyroid  disorder 03/14/2016   Chest pain 02/29/2016   Intermediate coronary syndrome (HCC) 02/24/2016   Keloid skin disorder 01/17/2016   Costochondritis 01/17/2016   Long term current use of opiate analgesic 12/23/2015   Long term prescription opiate use 12/23/2015   Vitamin D  insufficiency 11/21/2015   Lumbar facet syndrome (Bilateral) (R>L) 11/21/2015   Lumbar spondylosis 11/21/2015   Elevated sedimentation rate 11/01/2015   Chronic shoulder pain (3ry area of Pain) (Right) 10/25/2015   Chronic hip pain (Left) 10/25/2015   History of alcoholism (HCC) 10/25/2015   Opiate use 10/25/2015   Encounter for therapeutic drug level monitoring 10/25/2015   Chronic chest wall pain (1ry area of Pain) (Incisional Midline) (since 04/22/2012) 10/25/2015   Chronic lower extremity pain (2ry area of Pain) (Bilateral)  (R>L) 10/25/2015   History of MI (myocardial infarction) (January 2014) 10/25/2015   Encounter for pain management planning 10/25/2015   Chronic sacroiliac joint pain (Bilateral) (L>R) 10/25/2015   Neuropathic pain 10/25/2015   Neurogenic pain 10/25/2015   Incisional pain 10/25/2015   Stasis dermatitis 09/13/2014   Mixed hyperlipidemia 03/16/2014   Coronary angioplasty status 09/25/2012   Elevated prostate specific antigen (PSA) 06/30/2012   H/O coronary artery bypass surgery 05/12/2012   Presence of coronary angioplasty implant and graft 05/01/2012   Alcohol withdrawal syndrome (HCC) 04/28/2012   Unspecified injury of unspecified kidney, initial encounter 04/25/2012   OSA (obstructive sleep apnea) 04/22/2012   Coronary artery disease 04/22/2012   Apnea, sleep 04/21/2012   Chronic obstructive pulmonary disease (HCC) 04/21/2012   COPD (chronic obstructive pulmonary disease) (HCC) 04/21/2012   Benign essential hypertension 12/25/2011   Gout 12/25/2011   Herpes zoster 12/25/2011   Encounter for screening for malignant neoplasm of colon 12/25/2011    ONSET DATE:  04/23/2024  REFERRING DIAG: J38.01 (ICD-10-CM) - Paralysis of vocal cords and larynx, unilateral   THERAPY DIAG:  Dysphonia  Rationale for Evaluation and Treatment Rehabilitation  SUBJECTIVE:    PERTINENT HISTORY and DIAGNOSTIC FINDINGS: Pt  is a 68 year old male who 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 Pt with ENT evaluation on 05/27/2024. Note states he has hoarseness that is described as gravelly, raspy, and strained and mild to moderate in severity. He has associated difficulty swallowing (liquids and solids), dry mouth and throat clearing. The hoarseness has been present for 5 weeks after he had a valve replacement and they went through his left carotid artery. 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 This is more than likely from  the procedure.   PAIN:  Are you having pain? No   FALLS: Has patient fallen in last 6 months? No,   LIVING ENVIRONMENT: Lives with: lives alone Lives in: House/apartment  PLOF: Independent  PATIENT GOALS    to improve vocal quality  SUBJECTIVE STATEMENT: Pt eager, pleasant Pt accompanied by: self  OBJECTIVE:  TODAY'S TREATMENT:  Skilled ST session targeted pt's voice goals. SLP facilitated session by providing the following interventions:   Vocal Cord adduction targeted thru pushing against ball while producing loud sustained ah - Pt independent with 2 sets of 10 repetitions, no observed improvement in vocal quality. Pt reports periods of dizziness is about the same  Pitch glide: Pt independent to achieve two sets of 10 repetitions pitch glide with strong breath support, observed slight intermittent improvement in vocal quality.   While pt was independent with using a loud voice during counting, his vocal quality was not observed to be improved. Pt completed 3 sets.   Semi-Occluded Vocal Tract exercise - Pt independently able to complete with strong abdominal breath support observed. SLP further facilitated exercise by having pt say ah on exhale. Pt independently completed 2 sets of 8. SLP introduced humming happy birthday song into straw. Once conditioned to the task, pt maintained adequate breath support throughout exercise. Recommend pt to add exercises to HEP.   Pt also presents with continual habitual throat clearing, pt have voiced disinterest in lifestyle modifications.   PATIENT EDUCATION: Education details: vocal card paralysis Person educated: Patient Education method: Explanation Education comprehension: needs further education   HOME EXERCISE PROGRAM: As above     GOALS: Goals reviewed with patient? Yes  SHORT TERM GOALS: Target date: 10 sessions  The patient will increase hydration for an eventual goal of 6-8 glasses per day and limit  caffeine intake (to maximum of 1-2, 8 oz cups/day), as measured by patient report.  Baseline: Goal status: INITIAL  2.  The patient will maximize voice quality and loudness using breath support/oral resonance for sustained vowel production, pitch glides, and hierarchal speech drill.  Baseline:  Goal status: INITIAL   LONG TERM GOALS: Target date: 07/28/2024  The patient will be independent for abdominal breathing and breath support exercises.  Baseline:  Goal status: INITIAL  2.  The patient will demonstrate independent understanding of vocal hygiene concepts.  Baseline:  Goal status: INITIAL  3.  Pt will explain pictures of the structure and function of the vocal mechanism with 80% accuracy across 3 data sessions. Baseline:  Goal status: INITIAL   ASSESSMENT:  CLINICAL IMPRESSION: Patient is a 68 y.o. 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  Pt's dysphonia is c/b breathy, hoarse, strained, abnormally high pitch, short breath groups as well as habitual throat clearing.   See the above note for details about this session.   OBJECTIVE IMPAIRMENTS include voice disorder. These impairments are  limiting patient from effectively communicating at home and in community. Factors affecting potential to achieve goals and functional outcome are severity of impairments. Patient will benefit from skilled SLP services to address above impairments and improve overall function.  REHAB POTENTIAL: Fair severity of impairment (paralyzed vocal cord)  PLAN: SLP FREQUENCY: 1-2x/week  SLP DURATION: 8 weeks  PLANNED INTERVENTIONS: SLP instruction and feedback, Compensatory strategies, and Patient/family education     Happi B. Rubbie, M.S., CCC-SLP, Tree surgeon Certified Brain Injury Specialist [facility]  [facility] Rehabilitation Services Office [phone] Ascom [phone] Fax  [phone]

## 2024-06-18 ENCOUNTER — Encounter: Admitting: Speech Pathology

## 2024-06-22 ENCOUNTER — Ambulatory Visit: Admitting: Speech Pathology

## 2024-06-22 DIAGNOSIS — R490 Dysphonia: Secondary | ICD-10-CM

## 2024-06-22 NOTE — Therapy (Signed)
 OUTPATIENT SPEECH LANGUAGE PATHOLOGY  VOICE TREATMENT   Patient Name: [name] [name]. MRN: [id number] DOB:[birth year], 68 y.o., male Today's Date: 06/22/2024  PCP: [facility] REFERRING PROVIDER: Almarie Roe, [place]   End of Session - 06/22/24 1401     Visit Number 5    Number of Visits 17    Date for Recertification  07/28/24    Authorization Type Medicare Part A/ Medicare Part B    Progress Note Due on Visit 10    SLP Start Time 1400    SLP Stop Time  1445    SLP Time Calculation (min) 45 min    Activity Tolerance Patient tolerated treatment well          Past Medical History:  Diagnosis Date   Aortic stenosis    Benign esophageal stricture    Cardiomyopathy, secondary (HCC)    Coronary artery disease    Diabetes mellitus without complication (HCC)    Diverticulitis    GI bleed    Gout    Hemorrhoids    Hyperlipidemia    Hypertension    Sleep apnea    Tubular adenoma of colon    Past Surgical History:  Procedure Laterality Date   cardiac bypass     CARDIAC CATHETERIZATION Left 02/03/2016   Procedure: Left Heart Cath and Coronary Angiography;  Surgeon: Vinie DELENA Jude, MD;  Location: [facility] INVASIVE CV LAB;  Service: Cardiovascular;  Laterality: Left;   CARDIAC SURGERY     COLONOSCOPY WITH PROPOFOL  N/A 04/29/2015   Procedure: COLONOSCOPY WITH PROPOFOL ;  Surgeon: Deward CINDERELLA Piedmont, MD;  Location: [facility] ENDOSCOPY;  Service: Gastroenterology;  Laterality: N/A;   COLONOSCOPY WITH PROPOFOL  N/A 07/20/2020   Procedure: COLONOSCOPY WITH PROPOFOL ;  Surgeon: Toledo, Ladell POUR, MD;  Location: [facility] ENDOSCOPY;  Service: Gastroenterology;  Laterality: N/A;   CORONARY ANGIOPLASTY WITH STENT PLACEMENT     x2   CORONARY ARTERY BYPASS GRAFT     2013 & 2017   ESOPHAGOGASTRODUODENOSCOPY (EGD) WITH PROPOFOL  N/A 04/29/2015   Procedure: ESOPHAGOGASTRODUODENOSCOPY (EGD) WITH PROPOFOL ;  Surgeon: Deward CINDERELLA Piedmont, MD;  Location: [facility] ENDOSCOPY;  Service: Gastroenterology;   Laterality: N/A;   LEFT HEART CATH N/A 04/08/2023   Procedure: Left Heart Cath;  Surgeon: Ammon Blunt, MD;  Location: [facility] INVASIVE CV LAB;  Service: Cardiovascular;  Laterality: N/A;   LOWER EXTREMITY ANGIOGRAPHY Left 11/20/2021   Procedure: Lower Extremity Angiography;  Surgeon: Marea Selinda RAMAN, MD;  Location: [facility] INVASIVE CV LAB;  Service: Cardiovascular;  Laterality: Left;   SEPTOPLASTY     Patient Active Problem List   Diagnosis Date Noted   Weakness 09/17/2023   Elevated liver enzymes 09/16/2023   Occlusion and stenosis of right carotid artery 07/26/2023   Preoperative evaluation of a medical condition to rule out surgical contraindications (TAR required) 07/26/2023   Acute non-ST segment elevation myocardial infarction (HCC) 04/10/2023   PAD S/P bilateral common and iliac artery stents 02/23/2023   History of esophageal stricture s/p dilatation in 2016 02/23/2023   AKI (acute kidney injury) 02/23/2023   Encounter for counseling 12/26/2022   Moderate aortic valve stenosis 08/06/2022   Atherosclerosis of native arteries of extremity with intermittent claudication 09/27/2021   Personal history of COVID-19 01/26/2021   Chronic use of opiate for therapeutic purpose 01/17/2021   Uncomplicated opioid dependence (HCC) 10/31/2020   Personal history of other diseases of the digestive system 10/10/2020   Lower GI bleed 10/09/2020   Uncontrolled type 2 diabetes mellitus with hypoglycemia, with long-term  current use of insulin  (HCC) 10/09/2020   CKD stage 3 secondary to diabetes (HCC) 10/09/2020   Elevated LFTs 10/09/2020   DM2 (diabetes mellitus, type 2) (HCC) 10/09/2020   CKD stage 2 due to type 2 diabetes mellitus (HCC)    History of fall 05/02/2020   Laceration without foreign body of unspecified finger without damage to nail, subsequent encounter 08/20/2019   Pharmacologic therapy 05/04/2019   Disorder of skeletal system 05/04/2019   Problems influencing health status 05/04/2019    Body mass index 31.0-31.9, adult 09/23/2018   Osteoarthritis of hip (Left) 05/26/2018   Spondylosis without myelopathy or radiculopathy, lumbosacral region 05/25/2018   Other specified dorsopathies, sacral and sacrococcygeal region 05/25/2018   Chronic low back pain (Bilateral) (R>L) w/o sciatica 05/25/2018   DDD (degenerative disc disease), lumbosacral 05/13/2018   Lumbar facet arthropathy (Multilevel) (Bilateral) 05/13/2018   Grade 1 Anterolisthesis of L4 on L5 05/13/2018   Type 2 diabetes mellitus with hyperglycemia (HCC) 10/08/2017   Preventative health care 09/16/2017   Chronic pain syndrome 08/16/2016   Pneumonia 05/28/2016   Pressure injury of skin 05/28/2016   Opioid-induced constipation (OIC) 05/17/2016   Hypokalemia 05/01/2016   Brachial plexus injury, right 04/09/2016   Carotid atherosclerosis 04/04/2016   Hypoalbuminemia 04/04/2016   Right sided weakness 04/02/2016   S/P CABG 2013 with 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 artery bypass graft) 03/30/2016   Dry mouth 03/14/2016   Glucosuria 03/14/2016   Nocturia 03/14/2016   Angina pectoris, unspecified 03/14/2016   Screening for thyroid  disorder 03/14/2016   Chest pain 02/29/2016   Intermediate coronary syndrome (HCC) 02/24/2016   Keloid skin disorder 01/17/2016   Costochondritis 01/17/2016   Long term current use of opiate analgesic 12/23/2015   Long term prescription opiate use 12/23/2015   Vitamin D  insufficiency 11/21/2015   Lumbar facet syndrome (Bilateral) (R>L) 11/21/2015   Lumbar spondylosis 11/21/2015   Elevated sedimentation rate 11/01/2015   Chronic shoulder pain (3ry area of Pain) (Right) 10/25/2015   Chronic hip pain (Left) 10/25/2015   History of alcoholism (HCC) 10/25/2015   Opiate use 10/25/2015   Encounter for therapeutic drug level monitoring 10/25/2015   Chronic chest wall pain (1ry area of Pain) (Incisional Midline) (since 04/22/2012) 10/25/2015   Chronic lower extremity pain (2ry area of Pain) (Bilateral)  (R>L) 10/25/2015   History of MI (myocardial infarction) (January 2014) 10/25/2015   Encounter for pain management planning 10/25/2015   Chronic sacroiliac joint pain (Bilateral) (L>R) 10/25/2015   Neuropathic pain 10/25/2015   Neurogenic pain 10/25/2015   Incisional pain 10/25/2015   Stasis dermatitis 09/13/2014   Mixed hyperlipidemia 03/16/2014   Coronary angioplasty status 09/25/2012   Elevated prostate specific antigen (PSA) 06/30/2012   H/O coronary artery bypass surgery 05/12/2012   Presence of coronary angioplasty implant and graft 05/01/2012   Alcohol withdrawal syndrome (HCC) 04/28/2012   Unspecified injury of unspecified kidney, initial encounter 04/25/2012   OSA (obstructive sleep apnea) 04/22/2012   Coronary artery disease 04/22/2012   Apnea, sleep 04/21/2012   Chronic obstructive pulmonary disease (HCC) 04/21/2012   COPD (chronic obstructive pulmonary disease) (HCC) 04/21/2012   Benign essential hypertension 12/25/2011   Gout 12/25/2011   Herpes zoster 12/25/2011   Encounter for screening for malignant neoplasm of colon 12/25/2011    ONSET DATE:  04/23/2024  REFERRING DIAG: J38.01 (ICD-10-CM) - Paralysis of vocal cords and larynx, unilateral   THERAPY DIAG:  Dysphonia  Rationale for Evaluation and Treatment Rehabilitation  SUBJECTIVE:    PERTINENT HISTORY and DIAGNOSTIC FINDINGS: Pt  is a 68 year old male who 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 Pt with ENT evaluation on 05/27/2024. Note states he has hoarseness that is described as gravelly, raspy, and strained and mild to moderate in severity. He has associated difficulty swallowing (liquids and solids), dry mouth and throat clearing. The hoarseness has been present for 5 weeks after he had a valve replacement and they went through his left carotid artery. 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 This is more than likely from  the procedure.   PAIN:  Are you having pain? No   FALLS: Has patient fallen in last 6 months? No,   LIVING ENVIRONMENT: Lives with: lives alone Lives in: House/apartment  PLOF: Independent  PATIENT GOALS    to improve vocal quality  SUBJECTIVE STATEMENT: Pt eager, discussed [place] Pt accompanied by: self  OBJECTIVE:  TODAY'S TREATMENT:  Skilled ST session targeted pt's voice goals. SLP facilitated session by providing the following interventions:   Vocal Cord adduction targeted thru pushing against ball while producing loud sustained ah - Pt independent with 2 sets of 10 repetitions, no observed improvement in vocal quality. Pt with intermittent dizziness, breaks needed throughout exercise.  Pitch glide: Pt independent to achieve 2 sets of 10 repetitions pitch glide with strong breath support, observed no improvement in vocal quality.   While pt was independent with using a loud voice during counting, his vocal quality was not observed to be improved. Pt completed 3 sets.   Semi-Occluded Vocal Tract exercise - Pt independently able to complete 3 sets of 8 repetitions of the ah on exhale with strong abdominal breath support. SLP introduced pitch glide up into straw. Pt independently able to complete one set of 8. Pt reports dizziness during this activity. SLP recommends pt replace singing happy birthday song with the pitch glides in HEP.   HEP updated and written handout provided.  PATIENT EDUCATION: Education details: vocal card paralysis Person educated: Patient Education method: Explanation Education comprehension: needs further education   HOME EXERCISE PROGRAM: As above     GOALS: Goals reviewed with patient? Yes  SHORT TERM GOALS: Target date: 10 sessions  The patient will increase hydration for an eventual goal of 6-8 glasses per day and limit caffeine intake (to maximum of 1-2, 8 oz cups/day), as measured by patient report.   Baseline: Goal status: INITIAL  2.  The patient will maximize voice quality and loudness using breath support/oral resonance for sustained vowel production, pitch glides, and hierarchal speech drill.  Baseline:  Goal status: INITIAL   LONG TERM GOALS: Target date: 07/28/2024  The patient will be independent for abdominal breathing and breath support exercises.  Baseline:  Goal status: INITIAL  2.  The patient will demonstrate independent understanding of vocal hygiene concepts.  Baseline:  Goal status: INITIAL  3.  Pt will explain pictures of the structure and function of the vocal mechanism with 80% accuracy across 3 data sessions. Baseline:  Goal status: INITIAL   ASSESSMENT:  CLINICAL IMPRESSION: Patient is a 68 y.o. 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  Pt's dysphonia is c/b breathy, hoarse, strained, abnormally high pitch, short breath groups as well as habitual throat clearing.   See the above note for details about this session.   OBJECTIVE IMPAIRMENTS include voice disorder. These impairments are limiting patient from effectively communicating at  home and in community. Factors affecting potential to achieve goals and functional outcome are severity of impairments. Patient will benefit from skilled SLP services to address above impairments and improve overall function.  REHAB POTENTIAL: Fair severity of impairment (paralyzed vocal cord)  PLAN: SLP FREQUENCY: 1-2x/week  SLP DURATION: 8 weeks  PLANNED INTERVENTIONS: SLP instruction and feedback, Compensatory strategies, and Patient/family education     Happi B. Rubbie, M.S., CCC-SLP, Tree surgeon Certified Brain Injury Specialist [facility]  [facility] Rehabilitation Services Office [phone] Ascom [phone] Fax [phone]

## 2024-06-23 ENCOUNTER — Ambulatory Visit: Admitting: Speech Pathology

## 2024-06-24 ENCOUNTER — Ambulatory Visit: Admitting: Speech Pathology

## 2024-06-24 DIAGNOSIS — R490 Dysphonia: Secondary | ICD-10-CM

## 2024-06-24 NOTE — Therapy (Signed)
 OUTPATIENT SPEECH LANGUAGE PATHOLOGY  VOICE TREATMENT   Patient Name: [name] [name]. MRN: [id number] DOB:21-Feb-1956, 68 y.o., male Today's Date: 06/24/2024  PCP: [facility] REFERRING PROVIDER: Almarie Roe, [place]   End of Session - 06/24/24 1410     Visit Number 6    Number of Visits 17    Date for Recertification  07/28/24    Authorization Type Medicare Part A/ Medicare Part B    Progress Note Due on Visit 10    SLP Start Time 1400    SLP Stop Time  1445    SLP Time Calculation (min) 45 min    Activity Tolerance Patient tolerated treatment well          Past Medical History:  Diagnosis Date   Aortic stenosis    Benign esophageal stricture    Cardiomyopathy, secondary (HCC)    Coronary artery disease    Diabetes mellitus without complication (HCC)    Diverticulitis    GI bleed    Gout    Hemorrhoids    Hyperlipidemia    Hypertension    Sleep apnea    Tubular adenoma of colon    Past Surgical History:  Procedure Laterality Date   cardiac bypass     CARDIAC CATHETERIZATION Left 02/03/2016   Procedure: Left Heart Cath and Coronary Angiography;  Surgeon: Vinie DELENA Jude, MD;  Location: [facility] INVASIVE CV LAB;  Service: Cardiovascular;  Laterality: Left;   CARDIAC SURGERY     COLONOSCOPY WITH PROPOFOL  N/A 04/29/2015   Procedure: COLONOSCOPY WITH PROPOFOL ;  Surgeon: Deward CINDERELLA Piedmont, MD;  Location: [facility] ENDOSCOPY;  Service: Gastroenterology;  Laterality: N/A;   COLONOSCOPY WITH PROPOFOL  N/A 07/20/2020   Procedure: COLONOSCOPY WITH PROPOFOL ;  Surgeon: Toledo, Ladell POUR, MD;  Location: [facility] ENDOSCOPY;  Service: Gastroenterology;  Laterality: N/A;   CORONARY ANGIOPLASTY WITH STENT PLACEMENT     x2   CORONARY ARTERY BYPASS GRAFT     2013 & 2017   ESOPHAGOGASTRODUODENOSCOPY (EGD) WITH PROPOFOL  N/A 04/29/2015   Procedure: ESOPHAGOGASTRODUODENOSCOPY (EGD) WITH PROPOFOL ;  Surgeon: Deward CINDERELLA Piedmont, MD;  Location: [facility] ENDOSCOPY;  Service: Gastroenterology;   Laterality: N/A;   LEFT HEART CATH N/A 04/08/2023   Procedure: Left Heart Cath;  Surgeon: Ammon Blunt, MD;  Location: [facility] INVASIVE CV LAB;  Service: Cardiovascular;  Laterality: N/A;   LOWER EXTREMITY ANGIOGRAPHY Left 11/20/2021   Procedure: Lower Extremity Angiography;  Surgeon: Marea Selinda RAMAN, MD;  Location: [facility] INVASIVE CV LAB;  Service: Cardiovascular;  Laterality: Left;   SEPTOPLASTY     Patient Active Problem List   Diagnosis Date Noted   Weakness 09/17/2023   Elevated liver enzymes 09/16/2023   Occlusion and stenosis of right carotid artery 07/26/2023   Preoperative evaluation of a medical condition to rule out surgical contraindications (TAR required) 07/26/2023   Acute non-ST segment elevation myocardial infarction (HCC) 04/10/2023   PAD S/P bilateral common and iliac artery stents 02/23/2023   History of esophageal stricture s/p dilatation in 2016 02/23/2023   AKI (acute kidney injury) 02/23/2023   Encounter for counseling 12/26/2022   Moderate aortic valve stenosis 08/06/2022   Atherosclerosis of native arteries of extremity with intermittent claudication 09/27/2021   Personal history of COVID-19 01/26/2021   Chronic use of opiate for therapeutic purpose 01/17/2021   Uncomplicated opioid dependence (HCC) 10/31/2020   Personal history of other diseases of the digestive system 10/10/2020   Lower GI bleed 10/09/2020   Uncontrolled type 2 diabetes mellitus with hypoglycemia, with long-term  current use of insulin  (HCC) 10/09/2020   CKD stage 3 secondary to diabetes (HCC) 10/09/2020   Elevated LFTs 10/09/2020   DM2 (diabetes mellitus, type 2) (HCC) 10/09/2020   CKD stage 2 due to type 2 diabetes mellitus (HCC)    History of fall 05/02/2020   Laceration without foreign body of unspecified finger without damage to nail, subsequent encounter 08/20/2019   Pharmacologic therapy 05/04/2019   Disorder of skeletal system 05/04/2019   Problems influencing health status 05/04/2019    Body mass index 31.0-31.9, adult 09/23/2018   Osteoarthritis of hip (Left) 05/26/2018   Spondylosis without myelopathy or radiculopathy, lumbosacral region 05/25/2018   Other specified dorsopathies, sacral and sacrococcygeal region 05/25/2018   Chronic low back pain (Bilateral) (R>L) w/o sciatica 05/25/2018   DDD (degenerative disc disease), lumbosacral 05/13/2018   Lumbar facet arthropathy (Multilevel) (Bilateral) 05/13/2018   Grade 1 Anterolisthesis of L4 on L5 05/13/2018   Type 2 diabetes mellitus with hyperglycemia (HCC) 10/08/2017   Preventative health care 09/16/2017   Chronic pain syndrome 08/16/2016   Pneumonia 05/28/2016   Pressure injury of skin 05/28/2016   Opioid-induced constipation (OIC) 05/17/2016   Hypokalemia 05/01/2016   Brachial plexus injury, right 04/09/2016   Carotid atherosclerosis 04/04/2016   Hypoalbuminemia 04/04/2016   Right sided weakness 04/02/2016   S/P CABG 2013 with 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 artery bypass graft) 03/30/2016   Dry mouth 03/14/2016   Glucosuria 03/14/2016   Nocturia 03/14/2016   Angina pectoris, unspecified 03/14/2016   Screening for thyroid  disorder 03/14/2016   Chest pain 02/29/2016   Intermediate coronary syndrome (HCC) 02/24/2016   Keloid skin disorder 01/17/2016   Costochondritis 01/17/2016   Long term current use of opiate analgesic 12/23/2015   Long term prescription opiate use 12/23/2015   Vitamin D  insufficiency 11/21/2015   Lumbar facet syndrome (Bilateral) (R>L) 11/21/2015   Lumbar spondylosis 11/21/2015   Elevated sedimentation rate 11/01/2015   Chronic shoulder pain (3ry area of Pain) (Right) 10/25/2015   Chronic hip pain (Left) 10/25/2015   History of alcoholism (HCC) 10/25/2015   Opiate use 10/25/2015   Encounter for therapeutic drug level monitoring 10/25/2015   Chronic chest wall pain (1ry area of Pain) (Incisional Midline) (since 04/22/2012) 10/25/2015   Chronic lower extremity pain (2ry area of Pain) (Bilateral)  (R>L) 10/25/2015   History of MI (myocardial infarction) (January 2014) 10/25/2015   Encounter for pain management planning 10/25/2015   Chronic sacroiliac joint pain (Bilateral) (L>R) 10/25/2015   Neuropathic pain 10/25/2015   Neurogenic pain 10/25/2015   Incisional pain 10/25/2015   Stasis dermatitis 09/13/2014   Mixed hyperlipidemia 03/16/2014   Coronary angioplasty status 09/25/2012   Elevated prostate specific antigen (PSA) 06/30/2012   H/O coronary artery bypass surgery 05/12/2012   Presence of coronary angioplasty implant and graft 05/01/2012   Alcohol withdrawal syndrome (HCC) 04/28/2012   Unspecified injury of unspecified kidney, initial encounter 04/25/2012   OSA (obstructive sleep apnea) 04/22/2012   Coronary artery disease 04/22/2012   Apnea, sleep 04/21/2012   Chronic obstructive pulmonary disease (HCC) 04/21/2012   COPD (chronic obstructive pulmonary disease) (HCC) 04/21/2012   Benign essential hypertension 12/25/2011   Gout 12/25/2011   Herpes zoster 12/25/2011   Encounter for screening for malignant neoplasm of colon 12/25/2011    ONSET DATE:  04/23/2024  REFERRING DIAG: J38.01 (ICD-10-CM) - Paralysis of vocal cords and larynx, unilateral   THERAPY DIAG:  Dysphonia  Rationale for Evaluation and Treatment Rehabilitation  SUBJECTIVE:    PERTINENT HISTORY and DIAGNOSTIC FINDINGS: Pt  is a 68 year old male who 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 Pt with ENT evaluation on 05/27/2024. Note states he has hoarseness that is described as gravelly, raspy, and strained and mild to moderate in severity. He has associated difficulty swallowing (liquids and solids), dry mouth and throat clearing. The hoarseness has been present for 5 weeks after he had a valve replacement and they went through his left carotid artery. 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 This is more than likely from  the procedure.   PAIN:  Are you having pain? No   FALLS: Has patient fallen in last 6 months? No,   LIVING ENVIRONMENT: Lives with: lives alone Lives in: [name]/apartment  PLOF: Independent  PATIENT GOALS    to improve vocal quality  SUBJECTIVE STATEMENT: Pt pleasant, eager Pt accompanied by: self  OBJECTIVE:  TODAY'S TREATMENT:  Skilled ST session targeted pt's voice goals. SLP facilitated session by providing the following interventions:   Vocal Cord adduction targeted thru pushing against ball while producing loud sustained ah - Pt independent with 2 sets of 10 repetitions, no observed improvement in vocal quality. Pt with intermittent dizziness, breaks needed throughout exercise.  Pitch glide: Pt independent to achieve 2 sets of 10 repetitions pitch glide with strong breath support, observed no improvement in vocal quality.   While pt was independent with using a loud voice during counting, his vocal quality was not observed to be improved. Pt completed 3 sets.   Semi-Occluded Vocal Tract exercise - Pt independently able to complete 3 sets of 8 repetitions of the ah on exhale with strong abdominal breath support. SLP introduced pitch glide up into straw. Pt independently able to complete one set of 8. Pt reports dizziness during this activity.   Last ST session planned for 06/29/2024 d/t stent procedure. Pt agreeable, will continue to do vocal exercises at home as able and has appt to follow up with ENT next month.   PATIENT EDUCATION: Education details: vocal card paralysis Person educated: Patient Education method: Explanation Education comprehension: needs further education   HOME EXERCISE PROGRAM: As above     GOALS: Goals reviewed with patient? Yes  SHORT TERM GOALS: Target date: 10 sessions  The patient will increase hydration for an eventual goal of 6-8 glasses per day and limit caffeine intake (to maximum of 1-2, 8 oz cups/day), as measured by  patient report.  Baseline: Goal status: INITIAL  2.  The patient will maximize voice quality and loudness using breath support/oral resonance for sustained vowel production, pitch glides, and hierarchal speech drill.  Baseline:  Goal status: INITIAL   LONG TERM GOALS: Target date: 07/28/2024  The patient will be independent for abdominal breathing and breath support exercises.  Baseline:  Goal status: INITIAL  2.  The patient will demonstrate independent understanding of vocal hygiene concepts.  Baseline:  Goal status: INITIAL  3.  Pt will explain pictures of the structure and function of the vocal mechanism with 80% accuracy across 3 data sessions. Baseline:  Goal status: INITIAL   ASSESSMENT:  CLINICAL IMPRESSION: Patient is a 68 y.o. 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  Pt's dysphonia is c/b breathy, hoarse, strained, abnormally high pitch, short breath groups as well as habitual throat clearing.   See the above note for details about this session.   OBJECTIVE IMPAIRMENTS include voice disorder. These impairments are  limiting patient from effectively communicating at home and in community. Factors affecting potential to achieve goals and functional outcome are severity of impairments. Patient will benefit from skilled SLP services to address above impairments and improve overall function.  REHAB POTENTIAL: Fair severity of impairment (paralyzed vocal cord)  PLAN: SLP FREQUENCY: 1-2x/week  SLP DURATION: 8 weeks  PLANNED INTERVENTIONS: SLP instruction and feedback, Compensatory strategies, and Patient/family education   Hassell Roys, SLP Student Clinician  Happi B. Rubbie, M.S., CCC-SLP, Tree surgeon Certified Brain Injury Specialist [facility]  [facility] Rehabilitation Services Office [phone] Ascom [phone] Fax [phone]

## 2024-06-25 ENCOUNTER — Ambulatory Visit: Admitting: Speech Pathology

## 2024-06-29 ENCOUNTER — Ambulatory Visit: Admitting: Speech Pathology

## 2024-06-29 DIAGNOSIS — R490 Dysphonia: Secondary | ICD-10-CM | POA: Diagnosis not present

## 2024-06-29 NOTE — Therapy (Signed)
 OUTPATIENT SPEECH LANGUAGE PATHOLOGY  VOICE TREATMENT AND DISCHARGE SUMMARY   Patient Name: [name] [name]. MRN: [id number] DOB:04/11/1956, 68 y.o., male Today's Date: 06/29/2024  PCP: [facility] REFERRING PROVIDER: Almarie Roe, [place]   End of Session - 06/29/24 1441     Visit Number 7    Number of Visits 17    Date for Recertification  07/28/24    Authorization Type Medicare Part A/ Medicare Part B    Progress Note Due on Visit 10    SLP Start Time 1445    SLP Stop Time  1530    SLP Time Calculation (min) 45 min    Activity Tolerance Patient tolerated treatment well          Past Medical History:  Diagnosis Date   Aortic stenosis    Benign esophageal stricture    Cardiomyopathy, secondary (HCC)    Coronary artery disease    Diabetes mellitus without complication (HCC)    Diverticulitis    GI bleed    Gout    Hemorrhoids    Hyperlipidemia    Hypertension    Sleep apnea    Tubular adenoma of colon    Past Surgical History:  Procedure Laterality Date   cardiac bypass     CARDIAC CATHETERIZATION Left 02/03/2016   Procedure: Left Heart Cath and Coronary Angiography;  Surgeon: Vinie DELENA Jude, MD;  Location: [facility] INVASIVE [facility];  Service: Cardiovascular;  Laterality: Left;   CARDIAC SURGERY     COLONOSCOPY WITH PROPOFOL  N/A 04/29/2015   Procedure: COLONOSCOPY WITH PROPOFOL ;  Surgeon: Deward CINDERELLA Piedmont, MD;  Location: [facility] ENDOSCOPY;  Service: Gastroenterology;  Laterality: N/A;   COLONOSCOPY WITH PROPOFOL  N/A 07/20/2020   Procedure: COLONOSCOPY WITH PROPOFOL ;  Surgeon: Toledo, Ladell POUR, MD;  Location: [facility] ENDOSCOPY;  Service: Gastroenterology;  Laterality: N/A;   CORONARY ANGIOPLASTY WITH STENT PLACEMENT     x2   CORONARY ARTERY BYPASS GRAFT     2013 & 2017   ESOPHAGOGASTRODUODENOSCOPY (EGD) WITH PROPOFOL  N/A 04/29/2015   Procedure: ESOPHAGOGASTRODUODENOSCOPY (EGD) WITH PROPOFOL ;  Surgeon: Deward CINDERELLA Piedmont, MD;  Location: [facility] ENDOSCOPY;  Service:  Gastroenterology;  Laterality: N/A;   LEFT HEART CATH N/A 04/08/2023   Procedure: Left Heart Cath;  Surgeon: Ammon Blunt, MD;  Location: [facility] INV[facility] CV [facility];  Service: Cardiovascular;  Laterality: N/A;   LOWER EXTREMITY ANGIOGRAPHY Left 11/20/2021   Procedure: Lower Extremity Angiography;  Surgeon: Marea Selinda RAMAN, MD;  Location: [facility] INV[facility] CV [facility];  Service: Cardiovascular;  Laterality: Left;   SEPTOPLASTY     Patient Active Problem List   Diagnosis Date Noted   Weakness 09/17/2023   Elevated liver enzymes 09/16/2023   Occlusion and stenosis of right carotid artery 07/26/2023   Preoperative evaluation of a medical condition to rule out surgical contraindications (TAR required) 07/26/2023   Acute non-ST segment elevation myocardial infarction (HCC) 04/10/2023   PAD S/P bilateral common and iliac artery stents 02/23/2023   History of esophageal stricture s/p dilatation in 2016 02/23/2023   AKI (acute kidney injury) 02/23/2023   Encounter for counseling 12/26/2022   Moderate aortic valve stenosis 08/06/2022   Atherosclerosis of native arteries of extremity with intermittent claudication 09/27/2021   Personal history of COVID-19 01/26/2021   Chronic use of opiate for therapeutic purpose 01/17/2021   Uncomplicated opioid dependence (HCC) 10/31/2020   Personal history of other diseases of the digestive system 10/10/2020   Lower GI bleed 10/09/2020   Uncontrolled type 2 diabetes mellitus with  hypoglycemia, with long-term current use of insulin  (HCC) 10/09/2020   CKD stage 3 secondary to diabetes (HCC) 10/09/2020   Elevated LFTs 10/09/2020   DM2 (diabetes mellitus, type 2) (HCC) 10/09/2020   CKD stage 2 due to type 2 diabetes mellitus (HCC)    History of fall 05/02/2020   Laceration without foreign body of unspecified finger without damage to nail, subsequent encounter 08/20/2019   Pharmacologic therapy 05/04/2019   Disorder of skeletal system 05/04/2019   Problems influencing health  status 05/04/2019   Body mass index 31.0-31.9, adult 09/23/2018   Osteoarthritis of hip (Left) 05/26/2018   Spondylosis without myelopathy or radiculopathy, lumbosacral region 05/25/2018   Other specified dorsopathies, sacral and sacrococcygeal region 05/25/2018   Chronic low back pain (Bilateral) (R>L) w/o sciatica 05/25/2018   DDD (degenerative disc disease), lumbosacral 05/13/2018   Lumbar facet arthropathy (Multilevel) (Bilateral) 05/13/2018   Grade 1 Anterolisthesis of L4 on L5 05/13/2018   Type 2 diabetes mellitus with hyperglycemia (HCC) 10/08/2017   Preventative health care 09/16/2017   Chronic pain syndrome 08/16/2016   Pneumonia 05/28/2016   Pressure injury of skin 05/28/2016   Opioid-induced constipation (OIC) 05/17/2016   Hypokalemia 05/01/2016   Brachial plexus injury, right 04/09/2016   Carotid atherosclerosis 04/04/2016   Hypoalbuminemia 04/04/2016   Right sided weakness 04/02/2016   S/P CABG 2013 with 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 artery bypass graft) 03/30/2016   Dry mouth 03/14/2016   Glucosuria 03/14/2016   Nocturia 03/14/2016   Angina pectoris, unspecified 03/14/2016   Screening for thyroid  disorder 03/14/2016   Chest pain 02/29/2016   Intermediate coronary syndrome (HCC) 02/24/2016   Keloid skin disorder 01/17/2016   Costochondritis 01/17/2016   Long term current use of opiate analgesic 12/23/2015   Long term prescription opiate use 12/23/2015   Vitamin D  insufficiency 11/21/2015   Lumbar facet syndrome (Bilateral) (R>L) 11/21/2015   Lumbar spondylosis 11/21/2015   Elevated sedimentation rate 11/01/2015   Chronic shoulder pain (3ry area of Pain) (Right) 10/25/2015   Chronic hip pain (Left) 10/25/2015   History of alcoholism (HCC) 10/25/2015   Opiate use 10/25/2015   Encounter for therapeutic drug level monitoring 10/25/2015   Chronic chest wall pain (1ry area of Pain) (Incisional Midline) (since 04/22/2012) 10/25/2015   Chronic lower extremity pain (2ry area of  Pain) (Bilateral) (R>L) 10/25/2015   History of MI (myocardial infarction) (January 2014) 10/25/2015   Encounter for pain management planning 10/25/2015   Chronic sacroiliac joint pain (Bilateral) (L>R) 10/25/2015   Neuropathic pain 10/25/2015   Neurogenic pain 10/25/2015   Incisional pain 10/25/2015   Stasis dermatitis 09/13/2014   Mixed hyperlipidemia 03/16/2014   Coronary angioplasty status 09/25/2012   Elevated prostate specific antigen (PSA) 06/30/2012   H/O coronary artery bypass surgery 05/12/2012   Presence of coronary angioplasty implant and graft 05/01/2012   Alcohol withdrawal syndrome (HCC) 04/28/2012   Unspecified injury of unspecified kidney, initial encounter 04/25/2012   OSA (obstructive sleep apnea) 04/22/2012   Coronary artery disease 04/22/2012   Apnea, sleep 04/21/2012   Chronic obstructive pulmonary disease (HCC) 04/21/2012   COPD (chronic obstructive pulmonary disease) (HCC) 04/21/2012   Benign essential hypertension 12/25/2011   Gout 12/25/2011   Herpes zoster 12/25/2011   Encounter for screening for malignant neoplasm of colon 12/25/2011    ONSET DATE:  04/23/2024  REFERRING DIAG: J38.01 (ICD-10-CM) - Paralysis of vocal cords and larynx, unilateral   THERAPY DIAG:  Dysphonia  Rationale for Evaluation and Treatment Rehabilitation  SUBJECTIVE:    PERTINENT HISTORY and  DIAGNOSTIC FINDINGS: Pt is a 68 year old male who 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 Pt with ENT evaluation on 05/27/2024. Note states he has hoarseness that is described as gravelly, raspy, and strained and mild to moderate in severity. He has associated difficulty swallowing (liquids and solids), dry mouth and throat clearing. The hoarseness has been present for 5 weeks after he had a valve replacement and they went through his left carotid artery. 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 This is more  than likely from the procedure.   PAIN:  Are you having pain? No   FALLS: Has patient fallen in last 6 months? No,   LIVING ENVIRONMENT: Lives with: lives alone Lives in: House/apartment  PLOF: Independent  PATIENT GOALS    to improve vocal quality  SUBJECTIVE STATEMENT: Pt pleasant, eager Pt accompanied by: self  OBJECTIVE:  TODAY'S TREATMENT:  Skilled ST session targeted pt's voice goals. SLP facilitated session by providing the following interventions:   Vocal Cord adduction targeted thru pushing against ball while producing loud sustained ah - Pt independent with 2 sets of 10 repetitions, no observed improvement in vocal quality. Pt required long wait times > 2 minutes between sets d/t increased pt report of lightheadedness.  Pitch glide: Pt independent to achieve 2 sets of 10 repetitions pitch glide with strong breath support, observed no improvement in vocal quality.   Pt was independent with using a loud voice during counting, observed increased vocal intensity and slightly reduced hoarseness suspect d/t diaphragmatic breathing. Pt completed 4 sets.   PATIENT EDUCATION: Education details: vocal card paralysis Person educated: Patient Education method: Explanation Education comprehension: needs further education   HOME EXERCISE PROGRAM: As above     GOALS: Goals reviewed with patient? Yes  SHORT TERM GOALS: Target date: 10 sessions Updated 06/29/2024 The patient will increase hydration for an eventual goal of 6-8 glasses per day and limit caffeine intake (to maximum of 1-2, 8 oz cups/day), as measured by patient report.  Baseline: Goal status: INITIAL Progress made when consuming water within session   2.  The patient will maximize voice quality and loudness using breath support/oral resonance for sustained vowel production, pitch glides, and hierarchal speech drill.  Baseline:  Goal status: INITIAL Little progress made suspect d/t etiology of  impairment (vocal fold paralysis)   LONG TERM GOALS: Target date: 07/28/2024 Updated 06/29/2024 The patient will be independent for abdominal breathing and breath support exercises.  Baseline:  Goal status: INITIAL MET  2.  The patient will demonstrate independent understanding of vocal hygiene concepts.  Baseline:  Goal status: INITIAL MET  3.  Pt will explain pictures of the structure and function of the vocal mechanism with 80% accuracy across 3 data sessions. Baseline:  Goal status: INITIAL MET   ASSESSMENT:  CLINICAL IMPRESSION: Patient is a 68 y.o. 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  Pt's dysphonia is c/b breathy, hoarse, strained, abnormally high pitch, short breath groups as well as habitual throat clearing.   While pt was eager and became independent with exercises, little progress made for phonation suspect d/t etiology of impairment. Pt decided today would be last ST session d/t independence with HEP and upcoming stent procedure. Pt will continue to do vocal exercises at home as able and has appt to follow up with ENT next month. See the above note for details about this  session.   OBJECTIVE IMPAIRMENTS include voice disorder. These impairments are limiting patient from effectively communicating at home and in community. Factors affecting potential to achieve goals and functional outcome are severity of impairments. Patient will benefit from skilled SLP services to address above impairments and improve overall function.   [name] [name], SLP Student Clinician  [name] [name], M.S., CCC-SLP, Tree Surgeon Certified Brain Injury Specialist [facility]  [facility] Rehabilitation Services Office [phone] Ascom [phone] Fax [phone]

## 2024-06-30 ENCOUNTER — Ambulatory Visit: Admitting: Speech Pathology

## 2024-07-01 ENCOUNTER — Ambulatory Visit: Admitting: Speech Pathology

## 2024-07-02 ENCOUNTER — Ambulatory Visit: Admitting: Speech Pathology

## 2024-07-06 ENCOUNTER — Ambulatory Visit: Admitting: Speech Pathology

## 2024-07-07 ENCOUNTER — Encounter: Admitting: Speech Pathology

## 2024-07-09 ENCOUNTER — Encounter: Admitting: Speech Pathology

## 2024-07-14 ENCOUNTER — Other Ambulatory Visit (INDEPENDENT_AMBULATORY_CARE_PROVIDER_SITE_OTHER): Payer: Self-pay | Admitting: Nurse Practitioner

## 2024-07-14 ENCOUNTER — Ambulatory Visit: Admitting: Speech Pathology

## 2024-07-16 ENCOUNTER — Encounter: Admitting: Speech Pathology

## 2024-07-21 ENCOUNTER — Encounter: Admitting: Speech Pathology

## 2024-07-23 ENCOUNTER — Encounter: Admitting: Speech Pathology

## 2024-08-30 NOTE — Progress Notes (Unsigned)
 PROVIDER NOTE: Interpretation of information contained herein should be left to medically-trained personnel. Specific patient instructions are provided elsewhere under Patient Instructions section of medical record. This document was created in part using AI and STT-dictation technology, any transcriptional errors that may result from this process are unintentional.  Patient: [name] [name].  Service: E/M   PCP: [facility]  DOB: 1956-08-21  DOS: 08/31/2024  Provider: Emmy MARLA Blanch, NP  MRN: [id number]  Delivery: Face-to-face  Specialty: Interventional Pain Management  Type: Established Patient  Setting: Ambulatory outpatient facility  Specialty designation: 09  Referring Prov.: [facility]  Location: Outpatient office facility       History of present illness (HPI) [name] [name]., a 68 y.o. year old male, is here today because of his chest pain. [name] [name] primary complain today is Chest Pain  Pertinent problems: [name] [name] has H/O coronary artery bypass surgery; Chronic shoulder pain (3ry area of Pain) (Right); Chronic hip pain (Left); Opiate use; Chronic chest wall pain (1ry area of Pain) (Incisional Midline) (since 04/22/2012); Chronic lower extremity pain (2ry area of Pain) (Bilateral) (R>L); Chronic sacroiliac joint pain (Bilateral) (L>R); Neuropathic pain; Neurogenic pain; Incisional pain; Lumbar facet syndrome (Bilateral) (R>L); Lumbar spondylosis; Long term current use of opiate analgesic; Long term prescription opiate use; Costochondritis; Opioid-induced constipation (OIC); Chronic pain syndrome; DDD (degenerative disc disease), lumbosacral; Lumbar facet arthropathy (Multilevel) (Bilateral); Grade 1 Anterolisthesis of L4 on L5; Spondylosis without myelopathy or radiculopathy, lumbosacral region; Other specified dorsopathies, sacral and sacrococcygeal region; Chronic low back pain (Bilateral) (R>L) w/o sciatica; Osteoarthritis of hip (Left);  Pharmacologic therapy; Disorder of skeletal system; and Uncomplicated opioid dependence (HCC) on their pertinent problem list.  Pain Assessment: Severity of Chronic pain is reported as a 5 /10. Location: Chest Mid/Denies. Onset: More than a month ago. Quality: Sharp. Timing: Constant. Modifying factor(s): Pain medication. Vitals:  height is 5' 0.75 (1.543 m) and weight is 130 lb (59 kg). His temporal temperature is 97.1 F (36.2 C) (abnormal). His blood pressure is 115/65 and his pulse is 67. His respiration is 20 and oxygen saturation is 98%.  BMI: Estimated body mass index is 24.77 kg/m as calculated from the following:   Height as of this encounter: 5' 0.75 (1.543 m).   Weight as of this encounter: 130 lb (59 kg).  Last encounter: 06/10/2024. Last procedure: Visit date not found.  Reason for encounter: medication management. No change in medical history since last visit.  Patient's pain is at baseline.  Patient continues multimodal pain regimen as prescribed.  States that it provides pain relief and improvement in functional status.   Discussed the use of AI scribe software for clinical note transcription with the patient, who gave verbal consent to proceed.  History of Present Illness   [name] [name]. [name] [name] is a 68 year old male with a history of coronary artery bypass grafting who presents with chest and leg pain.  He experiences chest pain and pain in the right leg, specifically from the knee downwards. The leg pain corresponds to the area where a vein was harvested for his coronary artery bypass grafting surgery in 2013. He has undergone two open heart surgeries.  He is currently using a Butrans  patch at a dose of 7.5 mcg/hour for pain management and reports no side effects or adverse reactions from this medication.     Pharmacotherapy Assessment   Butrans  7.5 mcg/hr, 1 patch placed onto the skin once a  week for pain  Monitoring: [name] [name] reviewed during this encounter.        Pharmacotherapy: No side-effects or adverse reactions reported. Compliance: No problems identified. Effectiveness: Clinically acceptable.  [name] [name], [place]  08/31/2024  2:12 PM  Sign when Signing Visit Nursing Pain Medication Assessment:  Safety precautions to be maintained throughout the outpatient stay will include: orient to surroundings, keep bed in low position, maintain call bell within reach at all times, provide assistance with transfer out of bed and ambulation.  Medication Inspection Compliance: Pill count conducted under aseptic conditions, in front of the patient. Neither the pills nor the bottle was removed from the patient's sight at any time. Once count was completed pills were immediately returned to the patient in their original bottle.  Medication: Buprenorphine  patch (Butrans ) Pill/Patch Count: 1 of 4 pills/patches remain Pill/Patch Appearance: Markings consistent with prescribed medication Bottle Appearance: Standard pharmacy container. Clearly labeled. Filled Date: 26 / 04 / 2025 Last Medication intake:  08/28/24    UDS:  Summary  Date Value Ref Range Status  10/16/2023 FINAL  Final    Comment:    ==================================================================== [employer] Select 13 (MW) ==================================================================== Test                             Result       Flag       Units  Drug Present and Declared for Prescription Verification   Buprenorphine                   11           EXPECTED   ng/mg creat   Norbuprenorphine               10           EXPECTED   ng/mg creat    Source of buprenorphine  is a scheduled prescription medication.    Norbuprenorphine is an expected metabolite of buprenorphine .  ==================================================================== Test                      Result    Flag   Units      Ref Range   Creatinine              232              mg/dL       >=79 ==================================================================== Declared Medications:  The flagging and interpretation on this report are based on the  following declared medications.  Unexpected results may arise from  inaccuracies in the declared medications.   **Note: The testing scope of this panel does not include small to  moderate amounts of these reported medications:   Buprenorphine  Patch (BuTrans )   **Note: The testing scope of this panel does not include the  following reported medications:   Allopurinol  (Zyloprim )  Aspirin   Atorvastatin  (Lipitor)  Cholecalciferol   Clopidogrel  (Plavix )  Folic Acid   Furosemide  (Lasix )  Gabapentin  (Neurontin )  Insulin  Lelon)  Irbesartan  (Avapro )  Isosorbide  (Imdur )  Metoprolol  (Toprol )  Multivitamin  Nitroglycerin  (Nitrostat )  Potassium  Ranolazine  (Ranexa )  Semaglutide  Vitamin B1 ==================================================================== For clinical consultation, please call [phone]. ====================================================================     No results found for: CBDTHCR No results found for: D8THCCBX No results found for: D9THCCBX  ROS  Constitutional: Denies any fever or chills Gastrointestinal: No reported hemesis, hematochezia, vomiting, or acute GI distress Musculoskeletal: chest pain (pain due to cardiac bypass),  Back pain, leg pain  Neurological: No reported episodes of acute onset apraxia, aphasia, dysarthria, agnosia, amnesia, paralysis, loss of coordination, or loss of consciousness  Medication Review  Cholecalciferol , Semaglutide (1 MG/DOSE), allopurinol , aspirin  EC, atorvastatin , buprenorphine , clopidogrel , dapagliflozin propanediol, furosemide , gabapentin , irbesartan , isosorbide  mononitrate, metoprolol  succinate, multivitamin with minerals, naloxone , nitroGLYCERIN , and spironolactone  History Review  Allergy: [name] [name] has no known allergies. Drug: Mr.  [name]  reports no history of drug use. Alcohol:  reports current alcohol use of about 14.0 standard drinks of alcohol per week. Tobacco:  reports that he has quit smoking. His smoking use included cigarettes. He has quit using smokeless tobacco. Social: [name] [name]  reports that he has quit smoking. His smoking use included cigarettes. He has quit using smokeless tobacco. He reports current alcohol use of about 14.0 standard drinks of alcohol per week. He reports that he does not use drugs. Medical:  has a past medical history of Aortic stenosis, Benign esophageal stricture, Cardiomyopathy, secondary (HCC), Coronary artery disease, Diabetes mellitus without complication (HCC), Diverticulitis, GI bleed, Gout, Hemorrhoids, Hyperlipidemia, Hypertension, Sleep apnea, and Tubular adenoma of colon. Surgical: [name] [name]  has a past surgical history that includes Coronary angioplasty with stent; cardiac bypass; Cardiac surgery; Colonoscopy with propofol  (N/A, 04/29/2015); Esophagogastroduodenoscopy (egd) with propofol  (N/A, 04/29/2015); Septoplasty; Cardiac catheterization (Left, 02/03/2016); Coronary artery bypass graft; Colonoscopy with propofol  (N/A, 07/20/2020); Lower Extremity Angiography (Left, 11/20/2021); and Left Heart Cath (N/A, 04/08/2023). Family: family history includes Heart disease in his mother; Hypertension in his father.  Laboratory Chemistry Profile   Renal Lab Results  Component Value Date   BUN 17 01/31/2024   CREATININE 1.17 01/31/2024   GFRAA >60 05/07/2019   GFRNONAA >60 01/31/2024    Hepatic Lab Results  Component Value Date   AST 40 01/31/2024   ALT 27 01/31/2024   ALBUMIN 3.9 01/31/2024   ALKPHOS 121 01/31/2024   HCVAB NON REACTIVE 10/09/2020   LIPASE 35 01/31/2024    Electrolytes Lab Results  Component Value Date   NA 136 01/31/2024   K 4.6 01/31/2024   CL 103 01/31/2024   CALCIUM  8.9 01/31/2024   MG 2.2 10/09/2020   PHOS 3.7 10/09/2020    Bone Lab Results   Component Value Date   VD25OH 74.7 05/07/2019   VD125OH2TOT 30.8 10/26/2015    Inflammation (CRP: Acute Phase) (ESR: Chronic Phase) Lab Results  Component Value Date   CRP 0.9 05/07/2019   ESRSEDRATE 41 (H) 05/07/2019   LATICACIDVEN 1.9 10/09/2020         Note: Above Lab results reviewed.  Recent Imaging Review  VAS US  ABI WITH/WO TBI  LOWER EXTREMITY DOPPLER STUDY  Patient Name:  [name] [name]  Date of Exam:   05/12/2024 Medical Rec #: [id number]       Accession #:    [id number] Date of Birth: [birth year]        Patient Gender: M Patient Age:   53 years Exam Location:  [facility] Procedure:      VAS US  ABI WITH/WO TBI Referring Phys:  --------------------------------------------------------------------------------   Indications: Peripheral artery disease.  High Risk Factors: Hyperlipidemia, prior MI, coronary artery disease.   Vascular Interventions: 11/20/21: Bilateral CIA stents;.  Comparison Study: 05/2023  Performing Technologist: Jerel Croak RVT    Examination Guidelines: A complete evaluation includes at minimum, Doppler waveform signals and systolic blood pressure reading at the level of bilateral brachial, anterior tibial, and posterior tibial arteries, when vessel segments are accessible. Bilateral testing is  considered an integral part of a complete examination. Photoelectric Plethysmograph (PPG) waveforms and toe systolic pressure readings are included as required and additional duplex testing as needed. Limited examinations for reoccurring indications may be performed as noted.    ABI Findings: +---------+------------------+-----+--------+--------+ Right    Rt Pressure (mmHg)IndexWaveformComment  +---------+------------------+-----+--------+--------+ Brachial 117                                     +---------+------------------+-----+--------+--------+ PTA      131               1.09 biphasic          +---------+------------------+-----+--------+--------+ DP       134               1.12 biphasic         +---------+------------------+-----+--------+--------+ Great Toe98                0.82 Normal           +---------+------------------+-----+--------+--------+  +---------+------------------+-----+----------+-------+ Left     Lt Pressure (mmHg)IndexWaveform  Comment +---------+------------------+-----+----------+-------+ Brachial 120                                      +---------+------------------+-----+----------+-------+ PTA      142               1.18 monophasic        +---------+------------------+-----+----------+-------+ DP       109               0.91 monophasic        +---------+------------------+-----+----------+-------+ Great Toe84                0.70 Normal            +---------+------------------+-----+----------+-------+  +-------+-----------+-----------+------------+------------+ ABI/TBIToday's ABIToday's TBIPrevious ABIPrevious TBI +-------+-----------+-----------+------------+------------+ Right  1.12       .82        1.10        .94          +-------+-----------+-----------+------------+------------+ Left   1.18       .70        .83         .76          +-------+-----------+-----------+------------+------------+     Left ABIs appear increased compared to prior study on 05/2023. Right ABIs and TBIs appear essentially unchanged compared to prior study on 05/2023.   Summary: Right: Resting right ankle-brachial index is within normal range. The right toe-brachial index is normal.   Left: Resting left ankle-brachial index is within normal range. The left toe-brachial index is normal.    *See table(s) above for measurements and observations.    Electronically signed by Selinda Gu MD on 05/13/2024 at 7:25:11 AM.      Final   Note: Reviewed        Physical Exam  Vitals: BP 115/65 (BP Location: Right  Arm, Patient Position: Sitting, Cuff Size: Normal)   Pulse 67   Temp (!) 97.1 F (36.2 C) (Temporal)   Resp 20   Ht 5' 0.75 (1.543 m)   Wt 130 lb (59 kg)   SpO2 98%   BMI 24.77 kg/m  BMI: Estimated body mass index is 24.77 kg/m as calculated from the following:   Height as of this encounter: 5' 0.75 (1.543  m).   Weight as of this encounter: 130 lb (59 kg). Ideal: Ideal body weight: 51.7 kg (114 lb 0.5 oz) Adjusted ideal body weight: 54.6 kg (120 lb 6.7 oz) General appearance: Well nourished, well developed, and well hydrated. In no apparent acute distress Mental status: Alert, oriented x 3 (person, place, & time)       Respiratory: No evidence of acute respiratory distress Eyes: PERLA   Musculoskeletal: +LBP,  bilateral leg pain  Assessment   Diagnosis Status  1. Chronic chest wall pain (1ry area of Pain) (Incisional Midline) (since 04/22/2012)   2. Pharmacologic therapy   3. Chronic lower extremity pain (2ry area of Pain) (Bilateral) (R>L)   4. Chronic low back pain (Bilateral) (R>L) w/o sciatica   5. Chronic pain syndrome   6. Encounter for medication management   7. Lumbar facet syndrome (Bilateral) (R>L)    Controlled Controlled Controlled   Updated Problems: Problem  Encounter for Medication Management    Plan of Care  Problem-specific:  Assessment and Plan    Chronic pain, chest and right lower extremity Chronic pain, patient is taking Butranspatch 7.5 mg. - Continue Butranspatch 7.5 mg with two refills. - Scheduled follow-up in three months.   Chronic pain syndrome: Patient's pain is well-controlled with Butrans  patch, continue on current medication regimen. Prescription drug monitoring ([name]) reviewed; findings consistent with the use of prescribed medication and no evidence of narcotic misuse or abuse.  Urine drug screening (UDS) up-to-date.  Schedule follow-up in 90 days for medication management.      Mr. Haidar Muse. has a current medication  list which includes the following long-term medication(s): allopurinol , atorvastatin , gabapentin , irbesartan , isosorbide  mononitrate, metoprolol  succinate, spironolactone, buprenorphine , and furosemide .  Pharmacotherapy (Medications Ordered): Meds ordered this encounter  Medications   buprenorphine  (BUTRANS ) 7.5 MCG/HR    Sig: Place 1 patch onto the skin once a week.    Dispense:  4 patch    Refill:  2    Chronic Pain: STOP Act (Not applicable) Fill 1 day early if closed on refill date. Avoid benzodiazepines within 8 hours of opioids   Orders:  No orders of the defined types were placed in this encounter.       Return in about 3 months (around 11/29/2024) for (F2F), (MM), Emmy Blanch NP.    Recent Visits Date Type Provider Dept  06/10/24 Office Visit Tullio Chausse K, NP [facility]  Showing recent visits within past 90 days and meeting all other requirements Today's Visits Date Type Provider Dept  08/31/24 Office Visit Nusaiba Guallpa K, NP [facility]  Showing today's visits and meeting all other requirements Future Appointments Date Type Provider Dept  11/24/24 Appointment Kaija Kovacevic K, NP [facility]  Showing future appointments within next 90 days and meeting all other requirements  I discussed the assessment and treatment plan with the patient. The patient was provided an opportunity to ask questions and all were answered. The patient agreed with the plan and demonstrated an understanding of the instructions.  Patient advised to call back or seek an in-person evaluation if the symptoms or condition worsens.  I personally spent a total of 30 minutes in the care of the patient today including preparing to see the patient, getting/reviewing separately obtained history, performing a medically appropriate exam/evaluation, counseling and educating, placing orders, referring and communicating with other health care professionals, documenting clinical  information in the EHR, independently interpreting results, communicating results, and coordinating care.  Note by: Byrant Valent K Yandriel Boening, NP (TTS and AI technology used. I apologize for any typographical errors that were not detected and corrected.) Date: 08/31/2024; Time: 3:00 PM

## 2024-08-31 ENCOUNTER — Ambulatory Visit: Attending: Nurse Practitioner | Admitting: Nurse Practitioner

## 2024-08-31 ENCOUNTER — Encounter: Payer: Self-pay | Admitting: Nurse Practitioner

## 2024-08-31 VITALS — BP 115/65 | HR 67 | Temp 97.1°F | Resp 20 | Ht 60.75 in | Wt 130.0 lb

## 2024-08-31 DIAGNOSIS — R0789 Other chest pain: Secondary | ICD-10-CM | POA: Diagnosis not present

## 2024-08-31 DIAGNOSIS — G894 Chronic pain syndrome: Secondary | ICD-10-CM | POA: Diagnosis not present

## 2024-08-31 DIAGNOSIS — M79604 Pain in right leg: Secondary | ICD-10-CM | POA: Insufficient documentation

## 2024-08-31 DIAGNOSIS — Z79899 Other long term (current) drug therapy: Secondary | ICD-10-CM | POA: Diagnosis not present

## 2024-08-31 DIAGNOSIS — M47816 Spondylosis without myelopathy or radiculopathy, lumbar region: Secondary | ICD-10-CM | POA: Insufficient documentation

## 2024-08-31 DIAGNOSIS — G8929 Other chronic pain: Secondary | ICD-10-CM | POA: Insufficient documentation

## 2024-08-31 DIAGNOSIS — M545 Low back pain, unspecified: Secondary | ICD-10-CM | POA: Diagnosis not present

## 2024-08-31 DIAGNOSIS — M79605 Pain in left leg: Secondary | ICD-10-CM | POA: Diagnosis not present

## 2024-08-31 MED ORDER — BUPRENORPHINE 7.5 MCG/HR TD PTWK: 1.0000 | MEDICATED_PATCH | TRANSDERMAL | 2 refills | Status: AC

## 2024-08-31 NOTE — Progress Notes (Signed)
 Nursing Pain Medication Assessment:  Safety precautions to be maintained throughout the outpatient stay will include: orient to surroundings, keep bed in low position, maintain call bell within reach at all times, provide assistance with transfer out of bed and ambulation.  Medication Inspection Compliance: Pill count conducted under aseptic conditions, in front of the patient. Neither the pills nor the bottle was removed from the patient's sight at any time. Once count was completed pills were immediately returned to the patient in their original bottle.  Medication: Buprenorphine  patch (Butrans ) Pill/Patch Count: 1 of 4 pills/patches remain Pill/Patch Appearance: Markings consistent with prescribed medication Bottle Appearance: Standard pharmacy container. Clearly labeled. Filled Date: 71 / 04 / 2025 Last Medication intake:  08/28/24

## 2024-08-31 NOTE — Patient Instructions (Signed)
 ______________________________________________________________________    Opioid Pain Medication Update  To: All patients taking opioid pain medications. (I.e.: hydrocodone , hydromorphone , oxycodone , oxymorphone, morphine , codeine, methadone, tapentadol, tramadol , buprenorphine, fentanyl , etc.)  Re: Updated review of side effects and adverse reactions of opioid analgesics, as well as new information about long term effects of this class of medications.  Direct risks of long-term opioid therapy are not limited to opioid addiction and overdose. Potential medical risks include serious fractures, breathing problems during sleep, hyperalgesia, immunosuppression, chronic constipation, bowel obstruction, myocardial infarction, and tooth decay secondary to xerostomia.  Unpredictable adverse effects that can occur even if you take your medication correctly: Cognitive impairment, respiratory depression, and death. Most people think that if they take their medication correctly, and as instructed, that they will be safe. Nothing could be farther from the truth. In reality, a significant amount of recorded deaths associated with the use of opioids has occurred in individuals that had taken the medication for a long time, and were taking their medication correctly. The following are examples of how this can happen: Patient taking his/her medication for a long time, as instructed, without any side effects, is given a certain antibiotic or another unrelated medication, which in turn triggers a Drug-to-drug interaction leading to disorientation, cognitive impairment, impaired reflexes, respiratory depression or an untoward event leading to serious bodily harm or injury, including death.  Patient taking his/her medication for a long time, as instructed, without any side effects, develops an acute impairment of liver and/or kidney function. This will lead to a rapid inability of the body to breakdown and eliminate  their pain medication, which will result in effects similar to an overdose, but with the same medicine and dose that they had always taken. This again may lead to disorientation, cognitive impairment, impaired reflexes, respiratory depression or an untoward event leading to serious bodily harm or injury, including death.  A similar problem will occur with patients as they grow older and their liver and kidney function begins to decrease as part of the aging process.  Background information: Historically, the original case for using long-term opioid therapy to treat chronic noncancer pain was based on safety assumptions that subsequent experience has called into question. In 1996, the American Pain Society and the American Academy of Pain Medicine issued a consensus statement supporting long-term opioid therapy. This statement acknowledged the dangers of opioid prescribing but concluded that the risk for addiction was low; respiratory depression induced by opioids was short-lived, occurred mainly in opioid-naive patients, and was antagonized by pain; tolerance was not a common problem; and efforts to control diversion should not constrain opioid prescribing. This has now proven to be wrong. Experience regarding the risks for opioid addiction, misuse, and overdose in community practice has failed to support these assumptions.  According to the Centers for Disease Control and Prevention, fatal overdoses involving opioid analgesics have increased sharply over the past decade. Currently, more than 96,700 people die from drug overdoses every year. Opioids are a factor in 7 out of every 10 overdose deaths. Deaths from drug overdose have surpassed motor vehicle accidents as the leading cause of death for individuals between the ages of 82 and 49.  Clinical data suggest that neuroendocrine dysfunction may be very common in both men and women, potentially causing hypogonadism, erectile dysfunction, infertility,  decreased libido, osteoporosis, and depression. Recent studies linked higher opioid dose to increased opioid-related mortality. Controlled observational studies reported that long-term opioid therapy may be associated with increased risk for cardiovascular events. Subsequent  meta-analysis concluded that the safety of long-term opioid therapy in elderly patients has not been proven.   Side Effects and adverse reactions: Common side effects: Drowsiness (sedation). Dizziness. Nausea and vomiting. Constipation. Physical dependence -- Dependence often manifests with withdrawal symptoms when opioids are discontinued or decreased. Tolerance -- As you take repeated doses of opioids, you require increased medication to experience the same effect of pain relief. Respiratory depression -- This can occur in healthy people, especially with higher doses. However, people with COPD, asthma or other lung conditions may be even more susceptible to fatal respiratory impairment.  Uncommon side effects: An increased sensitivity to feeling pain and extreme response to pain (hyperalgesia). Chronic use of opioids can lead to this. Delayed gastric emptying (the process by which the contents of your stomach are moved into your small intestine). Muscle rigidity. Immune system and hormonal dysfunction. Quick, involuntary muscle jerks (myoclonus). Arrhythmia. Itchy skin (pruritus). Dry mouth (xerostomia).  Long-term side effects: Chronic constipation. Sleep-disordered breathing (SDB). Increased risk of bone fractures. Hypothalamic-pituitary-adrenal dysregulation. Increased risk of overdose.  RISKS: Respiratory depression and death: Opioids increase the risk of respiratory depression and death.  Drug-to-drug interactions: Opioids are relatively contraindicated in combination with benzodiazepines, sleep inducers, and other central nervous system depressants. Other classes of medications (i.e.: certain antibiotics  and even over-the-counter medications) may also trigger or induce respiratory depression in some patients.  Medical conditions: Patients with pre-existing respiratory problems are at higher risk of respiratory failure and/or depression when in combination with opioid analgesics. Opioids are relatively contraindicated in some medical conditions such as central sleep apnea.   Fractures and Falls:  Opioids increase the risk and incidence of falls. This is of particular importance in elderly patients.  Endocrine System:  Long-term administration is associated with endocrine abnormalities (endocrinopathies). (Also known as Opioid-induced Endocrinopathy) Influences on both the hypothalamic-pituitary-adrenal axis?and the hypothalamic-pituitary-gonadal axis have been demonstrated with consequent hypogonadism and adrenal insufficiency in both sexes. Hypogonadism and decreased levels of dehydroepiandrosterone sulfate have been reported in men and women. Endocrine effects include: Amenorrhoea in women (abnormal absence of menstruation) Reduced libido in both sexes Decreased sexual function Erectile dysfunction in men Hypogonadisms (decreased testicular function with shrinkage of testicles) Infertility Depression and fatigue Loss of muscle mass Anxiety Depression Immune suppression Hyperalgesia Weight gain Anemia Osteoporosis Patients (particularly women of childbearing age) should avoid opioids. There is insufficient evidence to recommend routine monitoring of asymptomatic patients taking opioids in the long-term for hormonal deficiencies.  Immune System: Human studies have demonstrated that opioids have an immunomodulating effect. These effects are mediated via opioid receptors both on immune effector cells and in the central nervous system. Opioids have been demonstrated to have adverse effects on antimicrobial response and anti-tumour surveillance. Buprenorphine has been demonstrated to have  no impact on immune function.  Opioid Induced Hyperalgesia: Human studies have demonstrated that prolonged use of opioids can lead to a state of abnormal pain sensitivity, sometimes called opioid induced hyperalgesia (OIH). Opioid induced hyperalgesia is not usually seen in the absence of tolerance to opioid analgesia. Clinically, hyperalgesia may be diagnosed if the patient on long-term opioid therapy presents with increased pain. This might be qualitatively and anatomically distinct from pain related to disease progression or to breakthrough pain resulting from development of opioid tolerance. Pain associated with hyperalgesia tends to be more diffuse than the pre-existing pain and less defined in quality. Management of opioid induced hyperalgesia requires opioid dose reduction.  Cancer: Chronic opioid therapy has been associated with an increased risk  of cancer among noncancer patients with chronic pain. This association was more evident in chronic strong opioid users. Chronic opioid consumption causes significant pathological changes in the small intestine and colon. Epidemiological studies have found that there is a link between opium  dependence and initiation of gastrointestinal cancers. Cancer is the second leading cause of death after cardiovascular disease. Chronic use of opioids can cause multiple conditions such as GERD, immunosuppression and renal damage as well as carcinogenic effects, which are associated with the incidence of cancers.   Mortality: Long-term opioid use has been associated with increased mortality among patients with chronic non-cancer pain (CNCP).  Prescription of long-acting opioids for chronic noncancer pain was associated with a significantly increased risk of all-cause mortality, including deaths from causes other than overdose.  Reference: Von Korff M, Kolodny A, Deyo RA, Chou R. Long-term opioid therapy reconsidered. Ann Intern Med. 2011 Sep 6;155(5):325-8. doi:  10.7326/[id number]. PMID: 78106373; PMCID: [id number]. Kit JINNY Laurence CINDERELLA Pearley JINNY, Hayward RA, Dunn KM, Jordan KP. Risk of adverse events in patients prescribed long-term opioids: A cohort study in the [place] Clinical Practice Research [employer]. Eur J Pain. 2019 May;23(5):908-922. doi: 10.1002/ejp.1357. Epub 2019 Jan 31. PMID: 69379883. Colameco S, Coren JS, Ciervo CA. Continuous opioid treatment for chronic noncancer pain: a time for moderation in prescribing. Postgrad Med. 2009 Jul;121(4):61-6. doi: 10.3810/pgm.2009.07.2032. PMID: 80358728. Gigi JONELLE Shlomo MILUS Levern IVER Conny RN, [place] SD, Blazina I, Lonell DASEN, Bougatsos C, Deyo RA. The effectiveness and risks of long-term opioid therapy for chronic pain: a systematic review for a [employer] of [employer] Pathways to [employer]. Ann Intern Med. 2015 Feb 17;162(4):276-86. doi: 10.7326/M14-2559. PMID: 74418742. Rory CHRISTELLA Laurence [facility], Makuc DM. NCHS Data Brief No. 22. [place]: Centers for Disease Control and Prevention; 2009. Sep, Increase in Fatal Poisonings Involving Opioid Analgesics in the [place] , 1999-2006. Song IA, Choi HR, Oh TK. Long-term opioid use and mortality in patients with chronic non-cancer pain: Ten-year follow-up study in [place] from 2010 through 2019. EClinicalMedicine. 2022 Jul 18;51:101558. doi: 10.1016/j.eclinm.[id number]. PMID: 64124182; PMCID: [id number]. Huser, W., Schubert, T., Vogelmann, T. et al. All-cause mortality in patients with long-term opioid therapy compared with non-opioid analgesics for chronic non-cancer pain: a database study. BMC Med 18, 162 (2020). [email or web address].info/ Rashidian H, Zendehdel K, Kamangar F, Malekzadeh R, Haghdoost AA. An Ecological Study of the Association between Opiate Use and Incidence of Cancers. Addict Health. 2016 Fall;8(4):252-260. PMID: 71180443; PMCID: [id number].  Our Goal: Our goal is to control your pain with means other  than the use of opioid pain medications.  Our Recommendation: Talk to your physician about coming off of these medications. We can assist you with the tapering down and stopping these medicines. Based on the new information, even if you cannot completely stop the medication, a decrease in the dose may be associated with a lesser risk. Ask for other means of controlling the pain. Decrease or eliminate those factors that significantly contribute to your pain such as smoking, obesity, and a diet heavily tilted towards inflammatory nutrients.  Last Updated: 03/11/2023   ______________________________________________________________________       ______________________________________________________________________    Update on Controlled Substance (Opioid) Regulations   To: All patients taking opioid pain medications. (I.e.: hydrocodone , hydromorphone , oxycodone , oxymorphone, morphine , codeine, methadone, tapentadol, tramadol , buprenorphine, fentanyl , etc.)  Re: Review on the state of controlled substance regulations.  Introduction: Rules and regulations associated with all aspects of controlled substances are constantly being modified. Unfortunately we have encountered patients questioning the veracity of the information  that we provide them about these changes. This is intended to provide them with appropriate references and a historical review of these changes.  A Brief History: As of June 18, 2016, the [place]  Government declared the opioid epidemic a public health emergency. Prescription drug monitoring programs (PDMPs) and the [facility] All Schedules Prescription Electronic Reporting Act (NASPER). Before 1800, clinicians regarded pain as an existential phenomenon, a consequence of aging. There was no regulation on the use of cocaine and opioids, resulting in widespread marketing and prescribing for many ailments ranging from diarrhea to toothache. The [employer] of  [phone], passed in response to the sudden emergence of street heroin abuse as well as iatrogenic morphine  dependence, influenced both physician and patient alike to avoid opiates. Patients with unexplained pain in the 1920s were regarded as deluded, malingering, or abusers, and cancer patients through the 1950s were encouraged to wean themselves off opioids until their lives could be measured in weeks. Alongside this opioid evolution, the American Pain Society launched their influential pain as the fifth vital sign campaign in 1995. Concurrently, pharmaceutical companies introduced new formulations, such as extended release oxycodone  (OxyContin ). From 1997 to 2002, OxyContin  prescriptions increased from 670,000 to 6.2 million. However, concerns soon began to surface regarding overzealous opioid treatment. It must be noted that pharmaceutical companies contributed significantly to the rise of the opioid epidemic, receiving considerable reprimands as a consequence. In 2007, as the opioid epidemic began to inflict profound damage, [employer] pleaded guilty to federal charges related to the misbranding of OxyContin . [employer] agreed to pay a total of $634.5 million to resolve Justice Department investigations, as well as a $19.5 million settlement to [address] and the [address] of [place].  In response to the current epidemic, changes in focus to the development of new abuse deterrent opioid formulations at the [place]  Food and Drug Administration (FDA) as well as drafting of new public standards for pain treatment were created at [facility] in 2017. In response to the opioid epidemic, FDA public policy changes were announced in February 2016. Among these new positions were a re-examination of the risk-benefit paradigm for opioids with strict emphasis on the large public health ramifications. The various modified opioids released over the past 20 years, such as tamper-resistant preparation, have had differing levels of success,  and are collectively referred to as Risk Evaluation and Mitigation Strategies (REMS). There is also a growing focus on preventing opioid use disorder (OUD) and on offering affected individuals accessible and effective treatment. [place]  government policy reflects these changes and both the Affordable Care Act and the Mental Health Parity and Addiction Equity Act were major steps forward in treating opioid addiction. The Affordable Care Act, which was signed into law in 2010, with major provisions coming into effect by 2014.  In the 1990s, the intensified marketing of newly reformulated prescription opioid medications (e.g., OxyContin ) and an influential pain advocacy campaign that encouraged greater pain management led to a precipitous rise in opioid use in the [place] . Research from the Centers for Disease Control and Prevention (CDC) shows that prescription opioid sales in the [place]  quadrupled from 1999 to 2010. At the same time, opioid misuse and opioid-involved overdose deaths increased (Figure 1). Between 1999 and 2010, the rate of opioidinvolved overdose deaths in the [place]  doubled from 2.9 to 6.8 deaths per 100,000 people. This initial rise in opioid-related deaths is often referred to as the first wave of the recent opioid crisis.  Between 1999 and 2020, 565,000  Americans died of opioid-involved overdoses. In turn, federal, state, and local governments responded with various legal and policy efforts to curb opioid misuse and drug-related overdose Deaths.  Recent Congresses have enacted several laws addressing the opioid crisis, such as the Comprehensive Addiction and Recovery Act of 2016 (CARA, P.L. 114-198); the 21st Century Cures Act (P.L. 114-255); the Substance UseDisorder Prevention that Promotes Opioid Recovery and Treatment for Patients and Communities Act (SUPPORT Act, P.L. [phone]); the Fentanyl  Sanctions Act (Title LXXII of P.L. [id number]); and the Blocking  Deadly Fentanyl  Imports Act (P.L. 117-81, 6610). These laws addressed overprescribing and misuse of opioids, expanded substance use disorder prevention and treatment capacities, bolstered drug diversion capabilities, and enhanced international drug interdiction, counternarcotics cooperation, and sanctions efforts. Congress also directed additional funds to many of these initiatives through appropriations.  Congress provided funding in the [employer] Act of 2021 [phone]; P.L. 117-2) for syringe services programs (often known as needle exchange programs) and other harm reduction initiatives. Federal and state harm reduction strategies have frequently involved the distribution of naloxone (e.g., Narcan)--a medication used to reverse an opioid overdose--and test strips used to detect fentanyl  in drug samples.  The Department of Justice (DOJ) and Department of Homeland Security [facility]) aim to reduce the diversion of prescription opioids and the use, manufacturing, and trafficking of illicit opioids. DOJ--via the Drug Enforcement Administration (DEA)--regulates opioid manufacturers, distributors, and dispensers; it also controls the opioid supply through enforcement of regulatory requirements.  A History of Opiate Laws in the [place]   Prior to 1890, laws concerning opiates were strictly imposed on a local city or state-by-state basis. One of the first was in [place] in 1875 where it became illegal to smoke opium  only in opium  dens. It did not ban the sale, import or use otherwise. In the next 25 years different states enacted opium  laws ranging from outlawing opium  dens altogether to making possession of opium , morphine  and heroin without a physicians prescription illegal.  The first Congressional Act took place in 1890 that levied taxes on morphine  and opium . From that time on the [employer] has had a series of laws and acts directly aimed at opiate use, abuse  and control. These are outlined below:  1906 - Pure Food and Drug Act Preventing the manufacture, sale, or transportation of adulterated or misbranded or poisonous or deleterious foods, drugs, medicines, and liquors, and for regulating traffic therein, and for other purposes. Punishment included fines and prison time.  1909   - Smoking Opium  Exclusion Act Banned the importation, possession and use of smoking opium . Did not regulate opium -based medications. First Freight forwarder banning the non-medical use of a substance.  1914  - The Margrette Act In summary, The Margrette Act of 1914 was written more to have all parties involved in importing, exporting, set designer and distributing opium  or cocaine to register with the [employer] and have taxes levied upon them. Exempt from the law were physicians operating in the course of his professional practice  1919 - Supreme Court ratified the Bj's in Buckhorn et al., v. [place]  and [place]  v. [place], then again in [facility] v. [place] , in 1920, holding that doctors may not prescribe maintenance supplies of narcotics to people addicted to narcotics. However, it does not prohibit doctors from prescribing narcotics to wean a patient off of the drug. It was also the opinion of the court that prescribing narcotics to habitual users was not considered professional practice hence it  then was considered illegal for doctors to prescribe opioids for the purposes of maintaining an addiction. It can be argued that todays addiction medications are not intended to maintain an addiction but to facilitate addiction remission. In which case, this opinion of the court should not preclude practitioners from prescribing buprenorphine or methadone to patients suffering from an addictive disorder.  1924  - Heroin Act Architectural technologist, importation and possession of heroin illegal - even for medicinal use.  1922 -- Narcotic  Drug Import and Export Act Enacted to assure proper control of importation, sale, possession, production and consumption of narcotics.  1927  -- Special Educational Needs Teacher of [employer] of Prohibition was responsible for tracking bootleggers and organized conservation officer, historic buildings. They focused primarily on interstate and international cases and those cases where local law enforcement official would not or could not act.  1932 -- Uniform State Narcotic Act Encouraged states to pass uniform state laws matching the federal Narcotic Drug Import and Export Act. Suggested prohibiting cannabis use at the state level.  41 -- Food, Drug, and Cosmetic Act The new law brought cosmetics and medical devices under control, and it required that drugs be labeled with adequate directions for safe use. Moreover, it mandated pre-market approval of all new drugs, such that a manufacturer would have to prove to FDA that a drug were safe before it could be sold  1951 -- Boggs Act Imposed maximum criminal penalties for violations of the import/export and internal revenue laws related to drugs and also established mandatory minimum prison sentences.  1956 -- Narcotics Control Act Increased Boggs Act penalties and mandatory prison sentence minimums for violations of existing drug laws.  1965 -- Drug Abuse Control Amendment Enacted to deal with problems caused by abuse of depressants, stimulants and hallucinogens. Restricted research into psychoactive drugs such as LSD by requiring FDA approval.  1970 -- Controlled Substance Act  Controlled Substances Import and Export Act These laws are a consolidation of numerous laws regulating the manufacture and distribution of narcotics, stimulants, depressants, hallucinogens, anabolic steroids, and chemicals used in the illicit production of controlled substances. The CSA places all substances that are regulated under existing federal law into one of five schedules. This placement is based upon  the substance's medicinal value, harmfulness, and potential for abuse or addiction. Schedule I is reserved for the most dangerous drugs that have no recognized medical use, while Schedule V is the classification used for the least dangerous drugs. The act also provides a mechanism for substances to be controlled, added to a schedule, decontrolled, removed from control, rescheduled, or transferred from one schedule to another.  36 - Drug Enforcement Agency By Executive Order, the DEA was formed to take place of the [employer] of Narcotics and Dangerous Drugs.  37 -Narcotic Addict Treatment Act of  1974  - Public Law [phone] Amends the Controlled Substance Act of 1970 to provide for the registration of practitioners conducting narcotic treatment programs. [methadone clinics] It also provides legal definitions for the phrases maintenance treatment and detoxification treatment.  1986 -- Anti-Drug Abuse Act of 1986 Strengthened Federal efforts to encourage foreign cooperation in eradicating illicit drug crops and in halting international drug traffic, to improve enforcement of Federal drug laws and enhance interdiction of illicit drug shipments, to provide strong Market researcher in establishing effective drug abuse prevention and education programs, to [employer] support for drug abuse treatment and rehabilitation efforts, and for other purposes. It also re-imposed mandatory sentencing minimums depending on which drug and how  much was involved.  1988 -- Anti-Drug Abuse Act of 1988 Established the Office of Materials Engineer (ONDCP) in the [employer] of the Economist; authorized funds for [employer], state and local drug enforcement activities, school-based drug prevention efforts, and drug abuse treatment with special emphasis on injecting drug abusers at high risk for AIDS.  2000 -- Federal - The Drug Addiction Treatment Act of 2000 (DATA 2000) It enables qualified physicians to  prescribe and/or dispense narcotics for the purpose of treating opioid dependency. For the first time, physicians are able to treat this disease from their private offices or other clinical settings. This presents a very desirable treatment option for those who are unwilling or unable to seek help in drug treatment clinics. Patients can now be treated in the privacy of their doctors office, as are other people being treated for any other type of medical condition. One medicine doctors may now prescribe is Buprenorphine. The major downfall of this Act is the limitation of 30 patients per practice - which means that large facilities, no matter how many physicians are there, can only treat 30 patients at a time.  2002-- DEA reschedules buprenorphine from a schedule V drug to a schedule III drug, on June 09, 2001 - the day before the FDA approval of Suboxone and Subutex despite overwhelming objection by the medical community.  2004: June 2004 THE CONFIDENTIALITY OF ALCOHOL AND DRUG ABUSE PATIENT RECORDS REGULATION AND THE HIPAA PRIVACY RULE:  Confidentiality of Alcohol and Drug Dependence Patient Records (summary) Code of Federal Regulations Title 42 Part 2 (42 CFR Part 2)  The confidentiality of alcohol and drug dependence patient records maintained by this practice/program is protected by federal law and regulations. Generally, the practice/program may not say to a person outside the practice/program that a patient attends the practice/program, or disclose any information identifying a patient as being alcohol or drug dependent unless:  The patient consents in writing; The disclosure is allowed by a court order, or The disclosure is made to medical personnel in a medical emergency or to qualified personnel for research,  audit, or practice/program evaluation. Violation of the federal law and regulations by a practice/program is a crime. Suspected violations may be reported to appropriate authorities  in accordance with federal regulations. Freight forwarder and regulations do not protect any information about a crime committed by a patient either at the practice/program or against any person who works for the practice/program or about any threat to commit such a crime. Federal laws and regulations do not protect any information about suspected child abuse or neglect from being reported under state law to appropriate state or local authorities.  sample consent form (MS-WORD)  2005: 04-04-2004 Public law [phone], Amends the Controlled Substances Act to eliminate the 30-patient limit for medical group practices allowed to dispense narcotic drugs in schedules III, IV, or V for maintenance or detoxification treatment (retains the 30-patient limit for an individual physician). This amendment removes the 30-patient limit on group medical practices that treat opioid dependence with buprenorphine. The restriction was part of the original Drug Addiction Treatment Act of 2000 (DATA) that allowed treatment of opioid dependence in a doctor's office. With this change, every certified doctor may now prescribe buprenorphine up to his or her individual physician limit of 30 patients.  2006: On 08/31/2005 President Levy signed Bill H.R.6344 into law. This allows physicians who have been certified to prescribe certain drugs for the treatment of opioid dependence under DATA2000 to treat up to  100 patients (up from 30) by submitting an intent notification to the Dept of Health and [employer]. This is a major step forward in both fighting the stigma and allowing access to treatment previously not available to some. For more details see 30/100-PATIENT LIMIT  2016: HHS augments regulations concerning the 30/100 patient limit by raising the limit to 275 for qualifying physicians. Link to summary of regulation  2016: Comprehensive Addiction and Recovery Act of 2016 (sec.303) amends the Controlled Substance Act - to allow Nurse  Practitioners and Physician Assistants to become eligible to prescribe buprenorphine for the treatment of opioid use disorder. See the entire law for more details.  The roots of the concurrent regulation of certain drugs under two statutory schemes go back to the beginning of this century. In 1906, Congress enacted the Pure Food and Drug Act, establishing one regime of regulation to assure (among other things) that drugs were not adulterated or misbranded. These regulations were amended several times, recodified in 1938, and expanded on again from the 1940s through the 1990s. Their implementation and enforcement is today assigned to the Food and Drug Administration (FDA) in the Department of Health and Human Services [facility]).  In 1914, Congress adopted the [place] Narcotic Act to stop abuse of addictive drugs. The [place] Narcotic Act was amended in 1937 to include marijuana. In 1965, amphetamines, barbiturates, and hallucinogens came under regulation, but under the [employer], Drug, and Cosmetic Act. In 1970, these various statutes were consolidated and recodified as the Controlled Substances Act (CSA), which has been amended several times since then. Its implementation and enforcement is today assigned to the Drug Enforcement Administration (DEA) in the Department of Justice.  The first clash occurred after World War I, when so-called morphine  clinics existed and physicians prescribed or dispensed morphine  to addicts. Some addicts were veterans of the American Civil War, the Spanish-American War, and WWI, who had become addicted during treatment for war wounds, but most of them came from the growing population of nonmedical addicts ([place], 8017). The Narcotics Division of the Prohibition Unit of the Department of the Treasury, which was then responsible for enforcing the [facility] Narcotic Act, concluded that this activity was not the legitimate practice of medicine but simple drug trafficking. The  Treasury Department swiftly closed the clinics and made it personally and professionally risky for physicians to maintain a narcotic addict for any reason. In did so, however, only after the American Medical Association had adopted a resolution, in 1920, opposing ambulatory clinics''.  In 1972, the public health establishment, including the Secretary of Health, Education, and Welfare, the Education Officer, Environmental, the [employer] of [employer], and the Chemical Engineer for Drug Abuse Prevention, was unprepared to allow [employer] of Narcotics and Dangerous Drugs, DEA's predecessor agency, to unilaterally define the parameters of medical practice for the use of methadone in the treatment of heroin addiction. As a consequence, a new set of rules--the third, on top of the FDA and DEA schemes--was added, one that inserted FDA deeply into the practice of medicine, notwithstanding its protestations to the contrary. Congress ratified this joint responsibility of law enforcement and public health officials for methadone through this third set of rules in 1974 with the passage of the Narcotic Addict Treatment Act (NATA). To examine in detail the evolution of this third set of rules--commonly referred to as the FDA or DHHS methadone regulations--we turn, first, to the period of the mid-1960s.  Increased use of heroin in the  post World War II period first became apparent in the early to mid 1950s. During the [employer], a minimum mandatory narcotics law was enacted in 1956, effective July 1957. 1962 [facility] conference on drug abuse, the [employer] on Narcotic and Drug Abuse (the [employer]) of 1963, the Drug Abuse Control Amendments of 1965, the President's Commission on [employer] and Administration of Justice (the [employer]) of [phone], and the Narcotic Addiction Rehabilitation Act of 1966.  The 1965 Drug Abuse Control Amendments  brought under strict federal control all nonnarcotic drugs capable of producing serious psychotoxic effects when abused. This act also created the [employer] of Drug Abuse Control within the Department of Health, Education, and Welfare (DHEW) and shifted the basis for aon corporation of illegal drugs from tax principles (administered by the Department of Treasury) to the regulation of commerce (administered by the [employer]).  The 1966 Narcotic Addiction Rehabilitation Act TOUR MANAGER) authorized the civil commitment of narcotic addicts, and federal assistance to state and local governments to develop a local system of drug treatment programs. With respect to the latter, the [employer] of Mental Health [facility]) initially proposed the gradual implementation of the state assistance effort, mainly through a common mental health mechanism--inpatient treatment programs. However, because of a perceived pressing need, the courts began to commit addicts to these programs even before they were officially opened or staffed. The NARA legislation imposed the following contract requirements on treatment centers: (1) thrice-a-week counseling sessions; (2) weekly urine tests; (3) restorative dental services; (4) psychological consultations and vocational training; and (5) the treatment modalities of drug-free outpatient, therapeutic community, and methadone maintenance. Reorganization Plan No. 1 of 1968 transferred the primary functions of the [employer] of Narcotics (FBN) from the [employer] to the Department of Justice; it also transferred the [employer] of Drug Abuse Control functions to the Department of Justice. Within the [employer], the [employer] of Narcotics and Dangerous Drugs (BNDD) was created, which became the Drug Enforcement Administration in 1973.   Under the first [place] administration [phone]), federal drug abuse policy developed in a significant way. These developments included a 1969 war  on drugs presidential message, resulting legislation in 1970, and a Special Action Office created by executive order in 1971 and authorized in statute in 1972. Brynn, in 1969, to send a message to Congress on drug abuse. Although this was the first time that a U.S. president invoked the war on drugs image, it was in retrospect the most balanced approach to the problem of drug abuse that had been advanced. The 1969 message resulted in the submission of legislation to the Congress and the passage, the following year, of the Comprehensive Drug Abuse Prevention and Control Act of 1970 [employer] [phone], June 29, 1969). The act dealt with research, treatment, and prevention of drug abuse and drug dependence, and with drug abuse charity fundraiser. One major purpose of the 1970 legislation was to reverse some of the strictures of the [employer] of 1914. The 1970 act sought to clarify for the medical profession . . . the extent to which they may safely go in treating narcotic addicts as patients. Title I, in Section IV, charged the Surveyor, Minerals, Education, and Welfare, to determine the appropriate methods of professional practice in the medical treatment of the narcotic addiction of various classes of narcotic addicts. This provision constitutes the initial statutory basis for treatment standards. The law enforcement sections consolidated all prior federal statutes into the  Controlled Substances Act and the Controlled Substances Export and Import Act (Titles II and III, respectively, of the Comprehensive Drug Abuse Prevention and Control Act of 1970). Under this legislation, substances were classified under five schedules according to their abuse potential, and psychological and physical effects. Methadone was placed in Schedule II, along with such opiate drugs as morphine , codeine, and hydrocodone .  One of the most important steps taken by President Brynn was to establish in June 1971 the  Special Action Office for Drug Abuse Prevention (SAODAP) in the [employer] of the President ([place] [phone], February 17, 1970). In mid-1971, [facility] appointed Dr. Maple Dunnings as SAODAP director. Within a year, the Drug Abuse Prevention Office and Treatment Act of 1972 [employer] [phone], November 22, 1970) gave statutory authority to [facility], but limiting setting, on March 02, 1974, as the limit on its existence.  The purpose of the 1972 act was to bring the resources of the federal government to bear on drug abuse with the immediate objective of significantly reducing its incidence and developing a comprehensive, coordinated long-term federal strategy to combat drug abuse.  Narcotic Addict Treatment Act [employer]) of 1974 [employer] [phone]), which amended the Controlled Substances Act. This legislation was driven by concern for the diversion of methadone to illicit channels that was occurring in 1972 and 1973, as reflected in the title of the Senate bill adopted on February 09, 1972, the Methadone Diversion Control Act of 1973. (U.S. Senate, 1970a, 8029a).  The 1980 final rule (45 FR 37305, May 23, 1979) reduced the minimum standard for admission from two years of addiction to one year coupled with a clinical determination that the individual was currently physiologically.  The regulations were next revised in 1989, following two proposals to modify them, one in 1983 and one in 1987.  Under President Tanda Corrente, a government-wide effort was made to review all federal government regulations and to eliminate or reduce the burden of these regulations on the private sector, state and nash-finch company, and wps resources.   The 1983 recommendations, though not adopted, did initiate another revision of the methadone regulations, which first found expression in a 1987 proposed rule (52 FR 37047, June 04, 1986) and culminated in a final rule (54 FR 8954, November 03, 1987) at the end of the  decade. In the 1987 proposed rule, the FDA and NIDA, in an effort to put the best face on the unenthusiastic 1983 response by the provider community to converting the regulations to guidelines, indicated that they had retained the current requirements necessary to achieve the goals of the 1974 NATA, but were proposing to streamline the regulation and to promote more efficient operation of methadone programs. The 1987 proposed rule, issued by the FDA and NIDA, advanced the following changes in the methadone regulation: that detoxification treatment be divided into short-term (<21 days) and long-term (>21 and <180 days) treatment; that the minimum staffing ratio of one counselor to 50 patients be eliminated; that blood tests be allowed as ways to conduct initial drug screening or to meet the monthly testing requirements for six-day take-home patients; that the 72-hour notification of FDA and the pertinent state authority for methadone doses greater than 100 mg be eliminated; that special adverse reaction reporting requirements for methadone be eliminated and reliance placed upon general FDA reporting requirements; that a supervising counselor be allowed to conduct the annual review of the patient's treatment plan for certain qualified patients who had been in treatment for 3 years  or longer; and that the requirement of an annual report of methadone treatment programs to the FDA be dropped. The FDA and NIDA issued a final rule on November 03, 1987, based on comments on the 1987 proposed (54 FR 8954). Concurrently, FDA and NIDA issued a six-page guidance document, which noted that the regulations, over time, had recommended certain practices that were not actually required. Public Health Service, in Congress, and elsewhere, to reorganize the Alcohol, Drug Abuse, and Mental Health Administration (ADAMHA). These efforts culminated in the [employer] of 1992 [employer] [phone], March 13, 1991), the main  purpose of which was to transfer the research portions of the three ADAMHA institutes--NIDA, the General Mills of Alcoholism and Alcohol Abuse, and the General Mills of Mental Health--to the [employer] and to create the Substance Abuse and Museum/gallery Exhibitions Officer [facility]) as the home for the service functions of these entitles.  Guidelines for Opioid Treatment The Federal Guidelines for Opioid Treatment Programs - 2015 serve as a guide to accrediting organizations for developing accreditation standards. The guidelines also provide OTPs with information on how programs can achieve and maintain compliance with federal regulations. The 2015 guidelines are an update to the 2007 Guidelines for the Accreditation of Opioid Treatment Programs (PDF  547 KB). The new document reflects the obligation of OTPs to deliver care consistent with the patient-centered, integrated, and recovery-oriented standards of substance use treatment.  DPT oversees the certification of OTPs and provides guidance to nonprofit organizations and state governmental entities that want to become a SAMHSA-approved accrediting body. Learn more about the accreditation and certification of OTPs and [facility] oversight of OTP accreditation bodies.  Model Guidelines for [employer] With input from [facility], the Federation of [employer] in 2013 adopted a revised version of the federations office-based opioid treatment policies. The Model Policy on DATA 2000 and Treatment of Opioid Addiction in the Medical Office - 2013 (PDF  279 KB) provides model guidelines for use by state medical boards in regulating office-based opioid treatment.  Holiday Guidance for Opioid Treatment Programs (PDF  203 KB) In response to requests for the upcoming federal holidays and ensuing weekends (December 24th, 25th, and 26th and December 31st, Jan 1st, and Jan 2nd), this letter is to provide guidance  regarding requests for unsupervised doses of medication for patients for these dates. View a sample [id number] (PDF  194 KB).  Federal regulation of drugs emerged as early as 53, under a law that addressed only imported drugs. In 1905 the [employer] launched a private, voluntary means of controlling a substantial part of the drug marketplace, a system that remained in place for over a half-century. Drug regulation in FDA has evolved considerably since President Ricardo Para signed the 1906 Pure Food and Drugs Act.  1820 Eleven doctors set up the U.S. Pharmacopeia and record the first list of standard drugs. 1848 Drug Importation Act passed by Congress requires U.S. Customs Service inspection to stop entry of tainted, low quality drugs from overseas. 8116 Dr. Mitchell MICAEL Burrs becomes the chief chemist at the [facility] of [employer] adulteration studies.  1905 The American Medical Association [facility]) begins a voluntary program of drug approval that would last until 1955. In order to advertise in the [facility] and related journals, drug companies must show proof that the drug will treat what they claim. 1906 The original Food and Drug Act is passed by Congress on June 30 and signed by [employer].  The Act outlaws states from buying and selling food, drinks, and drugs that have been mislabeled and tainted. 1911 In U.S. v. Vicci, the [employer] that the [employer] and Drugs Act does not outlaw false medical claims but only false and misleading statements about the ingredients or identity of a drug. 1912 Congress passes the [place] Amendment to overcome the ruling in U.S. v. Vicci. The Act outlaws labeling medicines with fake medical claims that is meant to trick the buyer. 1930 The name of the Food, Drug, and Insecticide Administration is shortened to Food and Drug Administration (FDA) under an therapist, music. 1933 FDA  recommends a total rewrite of the out-of-date 1906 Food and Drugs Act.   1937 Elixir Sulfanilamide, contain the poisonous liquid, diethylene glycol, kills 107 persons, many of whom are children, dramatizing the need to establish drug safety before marketing and to pass the pending food and drug law. 1938 Congress passes [employer], Drug, and Cosmetic (FDC) Act of 1938, which requires that new drugs show safety before selling. This starts a new system of drug regulation. The Act also requires that safe limits be set for unavoidable poisonous matter and allows for factory inspections. The Directv is given power to oversee advertising for all FDAregulated products except prescription drugs. FDA states that sulfanilamide and other dangerous drugs must be given under the direction of a medical expert. This begins the requirement for prescription only (nonnarcotic) drugs (see 1951 Concrete-Humphrey amendment). 1941 Nearly 300 deaths and injuries result from the use of sulfathiazole tablets, an antibiotic, tainted with the sedative, phenobarbital. In response, FDA drastically changes manufacturing and quality controls. These changes lead to the development of good manufacturing practices (GMPs). 1948 The [employer] in U.S. v. Floretta that FDA jurisdiction extends to retail stores, thereby allowing FDA to stop illegal sales of drugs by pharmacies including barbiturates and amphetamines. 1950 In Walgreen. v. U.S., a U.S. Court of Appeals rules that the directions for use on a drug label must include the drugs purpose. 1951 Congress passes the Dundee-Humphrey Amendment, which defines the kinds of drugs that cannot be used safely without medical supervision. The amendment limits sale of these drugs to prescription only by a medical professional. All other drugs are to be available without a prescription. 1952 A nationwide investigation by FDA  reveals that chloramphenicol, an antibiotic, caused nearly 180 cases of often deadly blood diseases. Two years later FDA engages the [employer] of Hospital Pharmacists, the American Association of Medical Record Librarians, and later the American Medical Association in a voluntary program of drug reaction reporting. 1953 The [employer] Amendment clarifies previous law and requires FDA to give manufacturers written reports of conditions seen during inspections and results of factory samples. 1962 Thalidomide, a new sleeping pill, causes severe birth defects of the arms and legs in thousands of babies born in [place]. The U.S. media reports on how Dr. Cathlean Mort, a FDA medical officer, helped prevent approval and marketing of Thalidomide in the [place] . These reports stirred up public support for stronger drug laws. 3 Congress passes the [employer]. For the first time, these laws require drug makers to prove their drug works before FDA can approve them for sale. The Advisory Committee on Investigational Drugs meets for the first time. This was the first meeting of a committee to advise FDA on product approval and policy on an ongoing basis. 1966 FDA contracts with the [facility]  of [employer] to measure the effectiveness of 4,000 marketed drugs approved on the basis of safety alone between [phone] and 1961-09-11. The Fair Packaging and Labeling Act requires all consumer products, in interstate commerce, to be honestly and informatively labeled. 1967/09/12 FDA forms the Drug Efficacy Study Implementation (DESI) to carry out recommendations of the [employer] of the effectiveness of drugs first sold between [place] and 1963/01/09January 09, 1971 FDA requires the first patient package insert, medicines must come with information for the patient about risks and benefits. 1972 Over-the-Counter Drug Review begins  to enhance the safety, effectiveness and appropriate labeling of drugs sold without prescription. 1973 The U.S. Supreme Court upholds the [place] drug effectiveness law and approves FDAs action to control entire classes of products. 1982 FDA issues Tamper-resistant Packaging Regulations to prevent poisonings such as deaths from cyanide placed in Tylenol  capsules. Congress passes the [employer] in 11-Sep-1982, making it a crime to tamper with packaged consumer products. 1983/09/12 Drug Price Advertising Account Planner Act (Hatch-Waxman Act) increases the availability of less costly generic drugs by allowing FDA to approve applications for generic versions of brand-name drugs without repeating the research that proved the safety and effectiveness of the brand-name drugs. The Act also allowed brand-name companies to apply for up to five years additional patent protection for the new medicines they developed to make up for time lost while their products were going through FDA's approval process. 1989 The FDA issued guidelines asking drug makers to decide if a drug is likely to have usefulness in elderly people and to include elderly people in studies when applicable. 1991 In 11-Sep-1980, the FDA and the Department of Health and Human Services published a policy on protecting people in research. In 09-11-90, this policy is adopted by more than a dozen federal agencies involved in human subject research and becomes known as the Common Rule. 4 1993 FDA launches MedWatch, a system designed to collect reports from health professionals on problems with drugs and other medical products. FDA issues guidelines for measuring gender differences in responses to medication. Drug companies are encouraged to include patients of both sexes in their research of drugs and to study any gender-specific effects. 1995 FDA declares cigarettes to be drug delivery devices. Limits are issued on marketing and  sales to reduce smoking by young people. 1998 FDA introduces the Adverse Event Reporting System (AERS), a computerized database designed to store and study safety reports on already marketed drugs.  The Demographic Rule requires that a marketing application review data on safety and effectiveness by age, gender, and race. The Pediatric Rule requires drug makers of selected new and existing drugs to conduct studies on drug safety and effectiveness in children. 1999 Creation of the Drug Facts Label for OTC drug products. The law requires all overthe-counter drug labels to have information in a standard format. These drug facts labels are designed to give the user easy-to-find information. 2000 The U. S. [employer], upholds an earlier decision from [employer] and Drug Administration v. [employer]. et al. and rules 5-4 that FDA does not have authority to regulate tobacco as a drug. September 11, 2001 The Best Pharmaceuticals for Children Act, in exchange for studying the drug in children, the drug maker gets six months of selling their product without competition. 09/11/02 The Pediatric Research Equity Act gives FDA the right to ask drug companies to study the effectiveness of new drugs in children. Sep 12, 2003 FDA advises medical professionals to  limit the use of a pain reliever called Cox-2, a nonsteroidal anti-inflammatory drug (NSAIDs). Studies had shown that long-term use raised chances of heart attacks and strokes. The warning is also added to the over-thecounter NSAIDs Drug Facts label. Medicines used in hospitals must have a bar code to prevent patients from receiving the wrong medicine. 5 2005 The Drug Safety Board is formed, consisting of FDA staff and representatives from the [employer] of [address] and the [employer]. The Board advises the Director, Center for Drug Evaluation and Research, FDA, on drug safety issues and works with the agency in sharing safety  information to health professionals and patients.  The [place]  Food and Drug Administration (FDA) was first created to enforce the Pure Food and Drug Act of 1906. In this capacity, the FDA is charged with protecting the health of the [place]  public, to ensure the quality of its food, medicine, and cosmetics. Before this time, the [place]  government had no formal oversight of these products and left issues of quality and purity to the individual manufactures, or at times, individual states.    Review: [place] Stop ACT. (The Strengthen Opioid Misuse Prevention (STOP) Act of 2017). GENERAL ASSEMBLY OF [place]  SESSION 2017 SESSION LAW 2017-74 HOUSE BILL 243  PMP mandatory The dispenser shall report: (1) The dispenser's DEA number. (2) The name of the patient for whom the controlled substance is being dispensed, and the patient's: a. Full address, including city, state, and zip code, b. Telephone number, and c. Date of birth. (3) The date the prescription was written. (4) The date the prescription was filled. (5) The prescription number. (6) Whether the prescription is new or a refill. (7) Metric quantity of the dispensed drug. (8) Estimated days of supply of dispensed drug, if provided to the dispenser. (9) National Drug Code of dispensed drug. (10) Prescriber's DEA number. (11) Method of payment for the prescription.  No paper prescriptions  Duration of scripts Acute vs Chronic prescribing  2016 CDC Guidelines for prescribing Opioids for Chronic Pain. (Updated in 2022.) Medical Board  Laws:  Prescription Laws Drug laws, rules, and regulations are constantly changing. Any attempt to summarize them would quickly become outdated. Because of that, the Board encourages practitioners who seek guidance on prescribing procedures to refer to the sources listed below in addition to the Boards position statements, rules and Medical Practice Act.  [place]  Board of Pharmacy  (NCBOP) (which offers the states pharmacy laws and rules, and links to the Code of Federal Regulations) [employer] Site: www.ncbop.org  [place]  General Statutes General Web Site: politicalpool.cz See: [place]  Food, Drug, and Cosmetic Act: S293155 & 106-134 See: [place]  Pharmacy Practice Act, Article 4A: [phone] See: [place]  Controlled Substances Act, Article 5: 90-86 & 90-113.8 See: Use of controlled substances to render one mentally incapacitated or physically helpless: [facility]. Code, Title 21, Food & Drugs www.deadiversion.usdoj.gov Controlled Substances Schedules www.deadiversion.usdoj.gov Drug Warehouse Manager - www.deadiversion.usdoj.gov 42 CFR  8.12 - Federal opioid treatment standards.   Effective April 29, 2016, prior approval will be required for opioid analgesic doses for [facility]. Medicaid and [place] [facility]) beneficiaries which:  Exceed 120 mg of morphine  equivalents (MME) per day  Are greater than a 14-day supply of any opioid, or,  Are non-preferred opioid products on the [place] Medicaid Preferred Drug List (PDL)  FEDERAL 42 CFR  8.12 - Federal opioid treatment standards. Title II of the Comprehensive Drug Abuse  Prevention and Control Act of 1970, commonly known as the Controlled Substance Act (CSA) Title 21 [place]  Code ([place]) Controlled Substances Act.   Reference:   ______________________________________________________________________       ______________________________________________________________________    Medication Rules  Purpose: To inform patients, and their family members, of our medication rules and regulations.  Applies to: All patients receiving prescriptions from our practice (written or electronic).  Pharmacy of record: This is the pharmacy where your electronic prescriptions will be sent. Make sure we have the correct one.  Electronic  prescriptions: In compliance with the [place]  Strengthen Opioid Misuse Prevention (STOP) Act of 2017 (Session Law 2017-74/H243), effective September 03, 2018, all controlled substances must be electronically prescribed. Written prescriptions, faxing, or calling prescriptions to a pharmacy will no longer be done.  Prescription refills: These will be provided only during in-person appointments. No medications will be renewed without a face-to-face evaluation with your provider. Applies to all prescriptions.  NOTE: The following applies primarily to controlled substances (Opioid* Pain Medications).   Type of encounter (visit): For patients receiving controlled substances, face-to-face visits are required. (Not an option and not up to the patient.)  Patient's Responsibilities: Pain Pills: Bring all pain pills to every appointment (except for procedure appointments). Pill counts are required.  Pill Bottles: Bring pills in original pharmacy bottle. Bring bottle, even if empty. Always bring the bottle of the most recent fill.  Medication refills: You are responsible for knowing and keeping track of what medications you are taking and when is it that you will need a refill. The day before your appointment: write a list of all prescriptions that need to be refilled. The day of the appointment: give the list to the admitting nurse. Prescriptions will be written only during appointments. No prescriptions will be written on procedure days. If you forget a medication: it will not be Called in, Faxed, or electronically sent. You will need to get another appointment to get these prescribed. No early refills. Do not call asking to have your prescription filled early. Partial  or short prescriptions: Occasionally your pharmacy may not have enough pills to fill your prescription.  NEVER ACCEPT a partial fill or a prescription that is short of the total amount of pills that you were prescribed.  With  controlled substances the law allows 72 hours for the pharmacy to complete the prescription.  If the prescription is not completed within 72 hours, the pharmacist will require a new prescription to be written. This means that you will be short on your medicine and we WILL NOT send another prescription to complete your original prescription.  Instead, request the pharmacy to send a carrier to a nearby branch to get enough medication to provide you with your full prescription. Prescription Accuracy: You are responsible for carefully inspecting your prescriptions before leaving our office. Have the discharge nurse carefully go over each prescription with you, before taking them home. Make sure that your name is accurately spelled, that your address is correct. Check the name and dose of your medication to make sure it is accurate. Check the number of pills, and the written instructions to make sure they are clear and accurate. Make sure that you are given enough medication to last until your next medication refill appointment. Taking Medication: Take medication as prescribed. When it comes to controlled substances, taking less pills or less frequently than prescribed is permitted and encouraged. Never take more pills than instructed. Never take the medication more frequently than  prescribed.  Inform other Doctors: Always inform, all of your healthcare providers, of all the medications you take. Pain Medication from other Providers: You are not allowed to accept any additional pain medication from any other Doctor or Healthcare provider. There are two exceptions to this rule. (see below) In the event that you require additional pain medication, you are responsible for notifying us , as stated below. Cough Medicine: Often these contain an opioid, such as codeine or hydrocodone . Never accept or take cough medicine containing these opioids if you are already taking an opioid* medication. The combination may cause  respiratory failure and death. Medication Agreement: You are responsible for carefully reading and following our Medication Agreement. This must be signed before receiving any prescriptions from our practice. Safely store a copy of your signed Agreement. Violations to the Agreement will result in no further prescriptions. (Additional copies of our Medication Agreement are available upon request.) Laws, Rules, & Regulations: All patients are expected to follow all [address] and [employer], Rules, [place] [employer]. Ignorance of the Laws does not constitute a valid excuse.  Illegal drugs and Controlled Substances: The use of illegal substances (including, but not limited to marijuana and its derivatives) and/or the illegal use of any controlled substances is strictly prohibited. Violation of this rule may result in the immediate and permanent discontinuation of any and all prescriptions being written by our practice. The use of any illegal substances is prohibited. Adopted CDC guidelines & recommendations: Target dosing levels will be at or below 60 MME/day. Use of benzodiazepines** is not recommended. Urine Drug testing: Patients taking controlled substances will be required to provide a urine sample upon request. Do not void before coming to your medication management appointments. Hold emptying your bladder until you are admitted. The admitting nurse will inform you if a sample is required. Our practice reserves the right to call you at any time to provide a sample. Once receiving the call, you have 24 hours to comply with request. Not providing a sample upon request may result in termination of medication therapy.  Exceptions: There are only two exceptions to the rule of not receiving pain medications from other Healthcare Providers. Exception #1 (Emergencies): In the event of an emergency (i.e.: accident requiring emergency care), you are allowed to receive additional pain medication. However, you are  responsible for: As soon as you are able, call our office [phone], at any time of the day or night, and leave a message stating your name, the date and nature of the emergency, and the name and dose of the medication prescribed. In the event that your call is answered by a member of our staff, make sure to document and save the date, time, and the name of the person that took your information.  Exception #2 (Planned Surgery): In the event that you are scheduled by another doctor or dentist to have any type of surgery or procedure, you are allowed (for a period no longer than 30 days), to receive additional pain medication, for the acute post-op pain. However, in this case, you are responsible for picking up a copy of our Post-op Pain Management for Surgeons handout, and giving it to your surgeon or dentist. This document is available at our office, and does not require an appointment to obtain it. Simply go to our office during business hours (Monday-Thursday from 8:00 AM to 4:00 PM) (Friday 8:00 AM to 12:00 Noon) or if you have a scheduled appointment with us , prior to your surgery,  and ask for it by name. In addition, you are responsible for: calling our office [phone], at any time of the day or night, and leaving a message stating your name, name of your surgeon, type of surgery, and date of procedure or surgery. Failure to comply with your responsibilities may result in termination of therapy involving the controlled substances.  Consequences:  Non-compliance with the above rules may result in permanent discontinuation of medication prescription therapy. All patients receiving any type of controlled substance is expected to comply with the above patient responsibilities. Not doing so may result in permanent discontinuation of medication prescription therapy. Medication Agreement Violation. Following the above rules, including your responsibilities will help you in avoiding a Medication  Agreement Violation (Breaking your Pain Medication Contract).  *Opioid medications include: morphine , codeine, oxycodone , oxymorphone, hydrocodone , hydromorphone , meperidine , tramadol , tapentadol, buprenorphine, fentanyl , methadone. **Benzodiazepine medications include: diazepam (Valium), alprazolam (Xanax), clonazepam (Klonopine), lorazepam  (Ativan ), clorazepate (Tranxene), chlordiazepoxide (Librium), estazolam (Prosom), oxazepam (Serax), temazepam (Restoril), triazolam (Halcion) (Last updated: 06/26/2023) ______________________________________________________________________     ______________________________________________________________________    Medication Recommendations and Reminders  Applies to: All patients receiving prescriptions (written and/or electronic).  Medication Rules & Regulations: You are responsible for reading, knowing, and following our Medication Rules document. These exist for your safety and that of others. They are not flexible and neither are we. Dismissing or ignoring them is an act of non-compliance that may result in complete and irreversible termination of such medication therapy. For safety reasons, non-compliance will not be tolerated. As with the U.S. fundamental legal principle of ignorance of the law is no defense, we will accept no excuses for not having read and knowing the content of documents provided to you by our practice.  Pharmacy of record:  Definition: This is the pharmacy where your electronic prescriptions will be sent.  We do not endorse any particular pharmacy. It is up to you and your insurance to decide what pharmacy to use.  We do not restrict you in your choice of pharmacy. However, once we write for your prescriptions, we will NOT be re-sending more prescriptions to fix restricted supply problems created by your pharmacy, or your insurance.  The pharmacy listed in the electronic medical record should be the one where you want  electronic prescriptions to be sent. If you choose to change pharmacy, simply notify our nursing staff. Changes will be made only during your regular appointments and not over the phone.  Recommendations: Keep all of your pain medications in a safe place, under lock and key, even if you live alone. We will NOT replace lost, stolen, or damaged medication. We do not accept Police Reports as proof of medications having been stolen. After you fill your prescription, take 1 week's worth of pills and put them away in a safe place. You should keep a separate, properly labeled bottle for this purpose. The remainder should be kept in the original bottle. Use this as your primary supply, until it runs out. Once it's gone, then you know that you have 1 week's worth of medicine, and it is time to come in for a prescription refill. If you do this correctly, it is unlikely that you will ever run out of medicine. To make sure that the above recommendation works, it is very important that you make sure your medication refill appointments are scheduled at least 1 week before you run out of medicine. To do this in an effective manner, make sure that you do not leave the office  without scheduling your next medication management appointment. Always ask the nursing staff to show you in your prescription , when your medication will be running out. Then arrange for the receptionist to get you a return appointment, at least 7 days before you run out of medicine. Do not wait until you have 1 or 2 pills left, to come in. This is very poor planning and does not take into consideration that we may need to cancel appointments due to bad weather, sickness, or emergencies affecting our staff. DO NOT ACCEPT A Partial Fill: If for any reason your pharmacy does not have enough pills/tablets to completely fill or refill your prescription, do not allow for a partial fill. The law allows the pharmacy to complete that prescription within 72  hours, without requiring a new prescription. If they do not fill the rest of your prescription within those 72 hours, you will need a separate prescription to fill the remaining amount, which we will NOT provide. If the reason for the partial fill is your insurance, you will need to talk to the pharmacist about payment alternatives for the remaining tablets, but again, DO NOT ACCEPT A PARTIAL FILL, unless you can trust your pharmacist to obtain the remainder of the pills within 72 hours.  Prescription refills and/or changes in medication(s):  Prescription refills, and/or changes in dose or medication, will be conducted only during scheduled medication management appointments. (Applies to both, written and electronic prescriptions.) No refills on procedure days. No medication will be changed or started on procedure days. No changes, adjustments, and/or refills will be conducted on a procedure day. Doing so will interfere with the diagnostic portion of the procedure. No phone refills. No medications will be called into the pharmacy. No Fax refills. No weekend refills. No Holliday refills. No after hours refills.  Remember:  Business hours are:  Monday to Thursday 8:00 AM to 4:00 PM Provider's Schedule: Eric Como, MD - Appointments are:  Medication management: Monday and Wednesday 8:00 AM to 4:00 PM Procedure day: Tuesday and Thursday 7:30 AM to 4:00 PM Wallie Sherry, MD - Appointments are:  Medication management: Tuesday and Thursday 8:00 AM to 4:00 PM Procedure day: Monday and Wednesday 7:30 AM to 4:00 PM (Last update: 06/26/2022) ______________________________________________________________________     ______________________________________________________________________    National Pain Medication Shortage  The [place] is experiencing worsening drug shortages. These have had a negative widespread effect on patient care and treatment. Not expected to improve any time soon.  Predicted to last past 2029.   Drug shortage list (generic names) Oxycodone  IR Oxycodone /APAP Oxymorphone IR Hydromorphone  Hydrocodone /APAP Morphine   Where is the problem?  Manufacturing and supply level.  Will this shortage affect you?  Only if you take any of the above pain medications.  How? You may be unable to fill your prescription.  Your pharmacist may offer a partial fill of your prescription. (Warning: Do not accept partial fills.) Prescriptions partially filled cannot be transferred to another pharmacy. Read our Medication Rules and Regulation. Depending on how much medicine you are dependent on, you may experience withdrawals when unable to get the medication.  Recommendations: Consider ending your dependence on opioid pain medications. Ask your pain specialist to assist you with the process. Consider switching to a medication currently not in shortage, such as Buprenorphine. Talk to your pain specialist about this option. Consider decreasing your pain medication requirements by managing tolerance thru Drug Holidays. This may help minimize withdrawals, should you run out of medicine. Control your pain  thru the use of non-pharmacological interventional therapies.   Your prescriber: Prescribers cannot be blamed for shortages. Medication manufacturing and supply issues cannot be fixed by the prescriber.   NOTE: The prescriber is not responsible for supplying the medication, or solving supply issues. Work with your pharmacist to solve it. The patient is responsible for the decision to take or continue taking the medication and for identifying and securing a legal supply source. By law, supplying the medication is the job and responsibility of the pharmacy. The prescriber is responsible for the evaluation, monitoring, and prescribing of these medications.   Prescribers will NOT: Re-issue prescriptions that have been partially filled. Re-issue prescriptions already sent to  a pharmacy.  Re-send prescriptions to a different pharmacy because yours did not have your medication. Ask pharmacist to order more medicine or transfer the prescription to another pharmacy. (Read below.)  New 2023 regulation: May 04, 2022 Revised Regulation Allows DEA-Registered Pharmacies to Transfer Electronic Prescriptions at a Patients Request DEA Headquarters Division - Public Information Office Patients now have the ability to request their electronic prescription be transferred to another pharmacy without having to go back to their practitioner to initiate the request. This revised regulation went into effect on Monday, April 30, 2022.     At a patients request, a DEA-registered retail pharmacy can now transfer an electronic prescription for a controlled substance (schedules II-V) to another DEA-registered retail pharmacy. Prior to this change, patients would have to go through their practitioner to cancel their prescription and have it re-issued to a different pharmacy. The process was taxing and time consuming for both patients and practitioners.    The Drug Enforcement Administration [facility]) published its intent to revise the process for transferring electronic prescriptions on July 22, 2020.  The final rule was published in the federal register on March 29, 2022 and went into effect 30 days later.  Under the final rule, a prescription can only be transferred once between pharmacies, and only if allowed under existing state or other applicable law. The prescription must remain in its electronic form; may not be altered in any way; and the transfer must be communicated directly between two licensed pharmacists. Its important to note, any authorized refills transfer with the original prescription, which means the entire prescription will be filled at the same pharmacy.   Reference: hugehand.is [employer] website announcement)  Cheapwipes.at.pdf [employer])   [employer] / Vol. 88, No. 143 / Thursday, March 29, 2022 / Rules and Regulations DEPARTMENT OF JUSTICE  Drug Enforcement Administration  21 CFR Part 1306  [Docket No. [id number]]  RIN [id number] Transfer of Electronic Prescriptions for Schedules II-V Controlled Substances Between Pharmacies for Initial Filling  ______________________________________________________________________       ______________________________________________________________________    Transfer of Pain Medication between Pharmacies  Re: 2023 DEA Clarification on existing regulation  Published on DEA Website: May 04, 2022  Title: Revised Regulation Allows DEA-Registered Pharmacies to Electrical Engineer Prescriptions at a Patients Request DEA Headquarters Division - [employer]  Patients now have the ability to request their electronic prescription be transferred to another pharmacy without having to go back to their practitioner to initiate the request. This revised regulation went into effect on Monday, April 30, 2022.     At a patients request, a DEA-registered retail pharmacy can now transfer an electronic prescription for a controlled substance (schedules II-V) to another DEA-registered retail pharmacy. Prior to this change, patients would have to go through their practitioner  to cancel their prescription and have it re-issued to a different pharmacy. The process was taxing and time consuming for both patients and practitioners.    The Drug Enforcement Administration [facility]) published its intent to revise the process for transferring electronic prescriptions on July 22, 2020.  The final rule was published in the  federal register on March 29, 2022 and went into effect 30 days later.  Under the final rule, a prescription can only be transferred once between pharmacies, and only if allowed under existing state or other applicable law. The prescription must remain in its electronic form; may not be altered in any way; and the transfer must be communicated directly between two licensed pharmacists. Its important to note, any authorized refills transfer with the original prescription, which means the entire prescription will be filled at the same pharmacy.    REFERENCES: 1. DEA website announcement hugehand.is  2. Department of Justice website  Cheapwipes.at.pdf  3. DEPARTMENT OF JUSTICE Drug Enforcement Administration 21 CFR Part 1306 [Docket No. [id number]] RIN [id number] Transfer of Electronic Prescriptions for Schedules II-V Controlled Substances Between Pharmacies for Initial Filling  ______________________________________________________________________       ______________________________________________________________________    Medication Transfer   Notification You are currently compliant and stable on your pain medication regimen. This regimen will be transferred today to your Primary Care Provider (PCP). You will be provided with enough prescriptions to last for 90 days. After that, your prescriptions will need to be taken over by your PCP.  Recommendation Immediately contact your primary care provider to secure an appointment for evaluation before this period is over. Do not wait until the last month to contact them.   Clarification The transfer of your medication regimen does not mean that you are being discharged from our clinic. We will remain available to you for any consultation or interventional therapies you may need.    Alternative Should you decide not to continue taking these medication and would like assistance in permanently stopping them, please let us  know so that we can design a slow tapering down of your regimen.  Reason Our primary responsibility to provide specialized interventional pain management therapies otherwise not available to the community. We have in the past assisted primary care providers with reviewing and adjusting pain medication management therapies, however, we have been transparent to all patients and referring providers that it is not our intention to permanently take over this type of therapy. Transfer of this portion of your care will assist us  in freeing time to assist others in need of our specialty services.   ______________________________________________________________________      ______________________________________________________________________    WARNING: CBD (cannabidiol) & Delta (Delta-8 tetrahydrocannabinol) products.   Applicable to:  All individuals currently taking or considering taking CBD (cannabidiol) and, more important, all patients taking opioid analgesic controlled substances (pain medication). (Example: oxycodone ; oxymorphone; hydrocodone ; hydromorphone ; morphine ; methadone; tramadol ; tapentadol; fentanyl ; buprenorphine; butorphanol; dextromethorphan ; meperidine ; codeine; etc.)  Introduction:  Recently there has been a drive towards the use of natural products for the treatment of different conditions, including pain anxiety and sleep disorders. Marijuana and hemp are two varieties of the cannabis genus plants. Marijuana and its derivatives are illegal, while hemp and its derivatives are not. Cannabidiol (CBD) and tetrahydrocannabinol (THC), are two natural compounds found in plants of the Cannabis genus. They can both be extracted from hemp or marijuana. Both compounds interact with your bodys endocannabinoid system in very different ways. CBD is  associated with pain relief (analgesia) while THC  is associated with the psychoactive effects (the high) obtained from the use of marijuana products. There are two main types of THC: Delta-9, which comes from the marijuana plant and it is illegal, and Delta-8, which comes from the hemp plant, and it is legal. (Both, Delta-9-THC and Delta-8-THC are psychoactive and give you the high.)   Legality:  Marijuana and its derivatives: illegal Hemp and its derivatives: Legal (State dependent) UPDATE: (10/20/2021) The Drug Enforcement Agency (DEA) issued a letter stating that delta cannabinoids, including Delta-8-THCO and Delta-9-THCO, synthetically derived from hemp do not qualify as hemp and will be viewed as Schedule I drugs. (Schedule I drugs, substances, or chemicals are defined as drugs with no currently accepted medical use and a high potential for abuse. Some examples of Schedule I drugs are: heroin, lysergic acid diethylamide (LSD), marijuana (cannabis), 3,4-methylenedioxymethamphetamine (ecstasy), methaqualone, and peyote.) (cuetune.com.ee)  Legal status of CBD in [place]:  Conditionally Legal  Reference: FDA Regulation of Cannabis and Cannabis-Derived Products, Including Cannabidiol (CBD) - oemdeals.dk  Warning:  CBD is not FDA approved and has not undergo the same manufacturing controls as prescription drugs.  This means that the purity and safety of available CBD may be questionable. Most of the time, despite manufacturer's claims, it is contaminated with THC (delta-9-tetrahydrocannabinol - the chemical in marijuana responsible for the HIGH).  When this is the case, the [facility] contaminant will trigger a positive urine drug screen (UDS) test for Marijuana (carboxy-THC).   The FDA recently put out a warning about 5 things that everyone should be aware of regarding Delta-8  THC: Delta-8 THC products have not been evaluated or approved by the FDA for safe use and may be marketed in ways that put the public health at risk. The FDA has received adverse event reports involving delta-8 THC-containing products. Delta-8 THC has psychoactive and intoxicating effects. Delta-8 THC manufacturing often involve use of potentially harmful chemicals to create the concentrations of delta-8 THC claimed in the marketplace. The final delta-8 THC product may have potentially harmful by-products (contaminants) due to the chemicals used in the process. Manufacturing of delta-8 THC products may occur in uncontrolled or unsanitary settings, which may lead to the presence of unsafe contaminants or other potentially harmful substances. Delta-8 THC products should be kept out of the reach of children and pets.  NOTE: Because a positive UDS for any illicit substance is a violation of our medication agreement, your opioid analgesics (pain medicine) may be permanently discontinued.  MORE ABOUT CBD  General Information: CBD was discovered in 96 and it is a derivative of the cannabis sativa genus plants (Marijuana and Hemp). It is one of the 113 identified substances found in Marijuana. It accounts for up to 40% of the plant's extract. As of 2018, preliminary clinical studies on CBD included research for the treatment of anxiety, movement disorders, and pain. CBD is available and consumed in multiple forms, including inhalation of smoke or vapor, as an aerosol spray, and by mouth. It may be supplied as an oil containing CBD, capsules, dried cannabis, or as a liquid solution. CBD is thought not to be as psychoactive as THC (delta-9-tetrahydrocannabinol - the chemical in marijuana responsible for the HIGH). Studies suggest that CBD may interact with different biological target receptors in the body, including cannabinoid and other neurotransmitter receptors. As of 2018 the mechanism of action for its  biological effects has not been determined.  Side-effects  Adverse reactions: Dry mouth, diarrhea, decreased appetite, fatigue, drowsiness, malaise, weakness, sleep disturbances, and  others.  Drug interactions:  CBD may interact with medications such as blood-thinners. CBD causes drowsiness on its own and it will increase drowsiness caused by other medications, including antihistamines (such as Benadryl), benzodiazepines (Xanax, Ativan , Valium), antipsychotics, antidepressants, opioids, alcohol and supplements such as kava, melatonin and St. John's Wort.  Other drug interactions: Brivaracetam (Briviact); Caffeine; Carbamazepine (Tegretol); Citalopram  (Celexa ); Clobazam (Onfi); Eslicarbazepine (Aptiom); Everolimus (Zostress); Lithium; Methadone (Dolophine); Rufinamide (Banzel); Sedative medications (CNS depressants); Sirolimus (Rapamune); Stiripentol (Diacomit); Tacrolimus (Prograf); Tamoxifen ; Soltamox); Topiramate (Topamax); Valproate; Warfarin (Coumadin); Zonisamide. (Last update: 08/13/2022) ______________________________________________________________________     ______________________________________________________________________    Appointment Information  It is our goal and responsibility to provide the medical community with assistance in the evaluation and management of patients with chronic pain. Unfortunately our resources are limited. Because we do not have an unlimited amount of time, or available appointments, we are required to closely monitor for unkept or cancelled appointments.  Patient's responsibilities: 1. Punctuality: Patients are required to be physically present in our office at least 15 minutes before their scheduled appointment. 2. Tardiness: Patients not physically present in our office at their scheduled appointment time will be rescheduled. 3. Plan ahead: Assume that you will encounter traffic and plan to arrive 30 minutes before your appointment. 4. Other  appointments and responsibilities: Do not schedule other appointments immediately before or after your scheduled appointment.  5. Be prepared: Make a list of everything that you need to discuss with your provider so that you use your time efficiently. Once the provider leaves your room, he/she will not return to your room to discuss anything that you neglected to bring up during your allowed time. 6. No children or pets: Do not bring children or pets to your appointment. 7. Cancelling or rescheduling your appointment: Advanced notification (more than 24 hours in advance) is required. 8. No Show: Not calling to cancel an appointment and simply not showing up is unacceptable. This leads to loss of appointments that could have been used by a patient in need. (See below)  Corrective process for repeat offenders:  No Shows: Three (3) No Shows within a 12 month period will result in an automatic discharge from our practice. Rescheduling or cancelling with more than 24 hours notice will not be penalized and will not count against you. Tardiness: If you have to be rescheduled three (3) times due to late arrivals, it will be counted as one (1) No Show. Cancellation or reschedule: Three (3) cancellations or rescheduling where notice was given with less than 24 hours in advance, will be recorded as one (1) No Show.  Types of appointments: New patient initial evaluation: These are evaluations only. Your initial patient questionnaire will be collected and entered into the system. A history of present illness will be taken. Prior lab work, imaging studies, and associated treatments will be reviewed. The provider may order appropriate diagnostic testing depending on their evaluation and review of available information. No treatments will be started on this visit. 2nd Follow-up visit: During this visit your provider will inform you of the results of the diagnostic tests ordered on the initial evaluation.  Based on the providers assessment, treatment options will be offered, at which the patient will decide if he/she is interested in the alternatives. If interested, a treatment plan will be established and started. Procedure visits: Post-procedure evaluation visits: Evaluation visits MM New problems Flare-up evaluations Follow-up after diagnostic testing ______________________________________________________________________

## 2024-11-24 ENCOUNTER — Encounter: Admitting: Nurse Practitioner

## 2025-05-11 ENCOUNTER — Encounter (INDEPENDENT_AMBULATORY_CARE_PROVIDER_SITE_OTHER)

## 2025-05-11 ENCOUNTER — Ambulatory Visit (INDEPENDENT_AMBULATORY_CARE_PROVIDER_SITE_OTHER): Admitting: Vascular Surgery
